# Patient Record
Sex: Female | Born: 1955 | Race: White | Hispanic: No
Health system: Southern US, Academic
[De-identification: ages and names within clinical notes are randomized; demographics above are authoritative.]

## PROBLEM LIST (undated history)

## (undated) ENCOUNTER — Encounter: Attending: Internal Medicine | Primary: Internal Medicine

## (undated) ENCOUNTER — Ambulatory Visit: Payer: Medicare (Managed Care)

## (undated) ENCOUNTER — Ambulatory Visit: Payer: Medicare (Managed Care) | Attending: Internal Medicine | Primary: Internal Medicine

## (undated) ENCOUNTER — Ambulatory Visit

## (undated) ENCOUNTER — Ambulatory Visit: Payer: MEDICARE

## (undated) ENCOUNTER — Encounter

## (undated) ENCOUNTER — Ambulatory Visit: Payer: MEDICARE | Attending: Internal Medicine | Primary: Internal Medicine

## (undated) ENCOUNTER — Inpatient Hospital Stay: Payer: Medicare (Managed Care)

## (undated) ENCOUNTER — Ambulatory Visit: Payer: MEDICARE | Attending: Family | Primary: Family

## (undated) ENCOUNTER — Telehealth

## (undated) ENCOUNTER — Ambulatory Visit: Attending: Orthopaedic Surgery | Primary: Orthopaedic Surgery

## (undated) ENCOUNTER — Ambulatory Visit: Payer: MEDICARE | Attending: Pain Medicine | Primary: Pain Medicine

## (undated) ENCOUNTER — Ambulatory Visit: Attending: Internal Medicine | Primary: Internal Medicine

## (undated) ENCOUNTER — Ambulatory Visit: Attending: Physician Assistant | Primary: Physician Assistant

## (undated) ENCOUNTER — Ambulatory Visit: Payer: Medicare (Managed Care) | Attending: Pharmacotherapy | Primary: Pharmacotherapy

## (undated) ENCOUNTER — Encounter
Attending: Student in an Organized Health Care Education/Training Program | Primary: Student in an Organized Health Care Education/Training Program

## (undated) ENCOUNTER — Ambulatory Visit
Payer: MEDICARE | Attending: Student in an Organized Health Care Education/Training Program | Primary: Student in an Organized Health Care Education/Training Program

## (undated) DIAGNOSIS — K589 Irritable bowel syndrome without diarrhea: Secondary | ICD-10-CM

## (undated) DIAGNOSIS — I10 Essential (primary) hypertension: Secondary | ICD-10-CM

## (undated) DIAGNOSIS — F32A Depression, unspecified: Secondary | ICD-10-CM

## (undated) DIAGNOSIS — E785 Hyperlipidemia, unspecified: Secondary | ICD-10-CM

## (undated) DIAGNOSIS — M797 Fibromyalgia: Secondary | ICD-10-CM

## (undated) DIAGNOSIS — K219 Gastro-esophageal reflux disease without esophagitis: Secondary | ICD-10-CM

## (undated) DIAGNOSIS — T7840XA Allergy, unspecified, initial encounter: Secondary | ICD-10-CM

## (undated) DIAGNOSIS — M1990 Unspecified osteoarthritis, unspecified site: Secondary | ICD-10-CM

## (undated) DIAGNOSIS — I2699 Other pulmonary embolism without acute cor pulmonale: Secondary | ICD-10-CM

## (undated) DIAGNOSIS — G4730 Sleep apnea, unspecified: Secondary | ICD-10-CM

## (undated) DIAGNOSIS — F419 Anxiety disorder, unspecified: Secondary | ICD-10-CM

## (undated) DIAGNOSIS — J849 Interstitial pulmonary disease, unspecified: Secondary | ICD-10-CM

## (undated) DIAGNOSIS — F329 Major depressive disorder, single episode, unspecified: Secondary | ICD-10-CM

## (undated) HISTORY — PX: COLONOSCOPY: SHX174

## (undated) HISTORY — DX: Anxiety disorder, unspecified: F41.9

## (undated) HISTORY — DX: Fibromyalgia: M79.7

## (undated) HISTORY — PX: CHOLECYSTECTOMY: SHX55

## (undated) HISTORY — DX: Gastro-esophageal reflux disease without esophagitis: K21.9

## (undated) HISTORY — DX: Essential (primary) hypertension: I10

## (undated) HISTORY — DX: Hyperlipidemia, unspecified: E78.5

## (undated) HISTORY — PX: POLYPECTOMY: SHX149

## (undated) HISTORY — DX: Major depressive disorder, single episode, unspecified: F32.9

## (undated) HISTORY — DX: Allergy, unspecified, initial encounter: T78.40XA

## (undated) HISTORY — DX: Sleep apnea, unspecified: G47.30

## (undated) HISTORY — DX: Interstitial pulmonary disease, unspecified: J84.9

## (undated) HISTORY — DX: Unspecified osteoarthritis, unspecified site: M19.90

## (undated) HISTORY — DX: Depression, unspecified: F32.A

## (undated) HISTORY — DX: Irritable bowel syndrome, unspecified: K58.9

## (undated) MED ORDER — VORTIOXETINE 20 MG TABLET: Freq: Every day | ORAL | 0 days

---
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
## 1997-09-16 HISTORY — PX: PARTIAL HYSTERECTOMY: SHX80

## 2008-10-28 LAB — HM DEXA SCAN

## 2010-10-28 LAB — HM COLONOSCOPY

## 2013-03-16 LAB — HM MAMMOGRAPHY

## 2013-10-28 ENCOUNTER — Telehealth: Payer: Self-pay

## 2013-10-28 ENCOUNTER — Ambulatory Visit (INDEPENDENT_AMBULATORY_CARE_PROVIDER_SITE_OTHER): Payer: 59 | Admitting: Family Medicine

## 2013-10-28 ENCOUNTER — Encounter (INDEPENDENT_AMBULATORY_CARE_PROVIDER_SITE_OTHER): Payer: Self-pay

## 2013-10-28 ENCOUNTER — Encounter: Payer: Self-pay | Admitting: Family Medicine

## 2013-10-28 VITALS — BP 120/80 | HR 93 | Temp 98.4°F | Resp 16 | Wt 231.4 lb

## 2013-10-28 DIAGNOSIS — Z79890 Hormone replacement therapy (postmenopausal): Secondary | ICD-10-CM | POA: Insufficient documentation

## 2013-10-28 DIAGNOSIS — E88819 Insulin resistance, unspecified: Secondary | ICD-10-CM | POA: Insufficient documentation

## 2013-10-28 DIAGNOSIS — I10 Essential (primary) hypertension: Secondary | ICD-10-CM

## 2013-10-28 DIAGNOSIS — K219 Gastro-esophageal reflux disease without esophagitis: Secondary | ICD-10-CM | POA: Insufficient documentation

## 2013-10-28 DIAGNOSIS — F341 Dysthymic disorder: Secondary | ICD-10-CM

## 2013-10-28 DIAGNOSIS — F418 Other specified anxiety disorders: Secondary | ICD-10-CM | POA: Insufficient documentation

## 2013-10-28 DIAGNOSIS — E8881 Metabolic syndrome: Secondary | ICD-10-CM

## 2013-10-28 DIAGNOSIS — G4733 Obstructive sleep apnea (adult) (pediatric): Secondary | ICD-10-CM | POA: Insufficient documentation

## 2013-10-28 NOTE — Assessment & Plan Note (Signed)
New to provider, ongoing for pt.  Was following w/ GYN previously and plans to establish.  Previous GYN wanted her to wean off meds.  Will follow.

## 2013-10-28 NOTE — Assessment & Plan Note (Signed)
New to provider, ongoing for pt.  Unsure of settings.  Pt feels sxs are well controlled.  Will follow.

## 2013-10-28 NOTE — Assessment & Plan Note (Signed)
Chronic problem.  Feels sxs are well controlled.  Will follow.

## 2013-10-28 NOTE — Assessment & Plan Note (Signed)
New to provider, ongoing for pt.  Currently well controlled.  Asymptomatic.  Will follow.

## 2013-10-28 NOTE — Telephone Encounter (Signed)
Medication and allergies:  Reviewed and updated  90 day supply/mail order: n/a Local pharmacy:  [facility] on [address]   Immunizations due:  UTD   A/P: Personal, family history and past surgical hx: Updated  PAP- hx. hysterectomy CCS- "3 years ago"- benign polyps- per patient  MMG- 03/2013 per patient  Flu- 06/2013 per patient Tdap- Less than 10 years ago per patient   To Discuss with Provider: Concerned about blood pressure and blood sugar

## 2013-10-28 NOTE — Assessment & Plan Note (Signed)
New to provider, ongoing for pt.  Check A1C to determine current levels and start meds prn.  Strongly encouraged pt to follow low carb diet and get regular exercise.  Will follow closely.

## 2013-10-28 NOTE — Progress Notes (Signed)
   Subjective:    Patient ID: Stacey Alvarez, female    DOB: 11/01/1955, 58 y.o.   MRN: [id number]  HPI New to establish.  Previous MD- Rhys Martini in [place].  GYN- [facility]  HTN- chronic problem, on Norvasc, Lisinopril.  Well controlled.  No CP, SOB, visual changes.  + HAs due to sinus pressure.  No edema.  OSA- on CPAP.  Reports sxs are well controlled.  Not aware of settings  Insulin resistance- last A1C was 6.4 3-4 months ago.  + family hx of DM (mom and dad).  Not exercising regularly- 'i don't know what's wrong w/ me'.  GERD- chronic problem, well controlled on Dexilant.  Hx of IBS.    Depression/anxiety- was previously seeing psych in [place].  Has appt upcoming w/ Dr Toy Care, she will manage the Xanax, Lamictal, Temazepam, Effexor.  Father recently passed away and has son w/ CF (age 43)  HRT- currently on Premarin, plans to schedule w/ GYN later this year.   Review of Systems For ROS see HPI     Objective:   Physical Exam  Constitutional: She is oriented to person, place, and time. She appears well-developed and well-nourished. No distress.  HENT:  Head: Normocephalic and atraumatic.  Eyes: Conjunctivae and EOM are normal. Pupils are equal, round, and reactive to light.  Neck: Normal range of motion. Neck supple. No thyromegaly present.  Cardiovascular: Normal rate, regular rhythm, normal heart sounds and intact distal pulses.   No murmur heard. Pulmonary/Chest: Effort normal and breath sounds normal. No respiratory distress.  Abdominal: Soft. She exhibits no distension. There is no tenderness.  Musculoskeletal: She exhibits no edema.  Lymphadenopathy:    She has no cervical adenopathy.  Neurological: She is alert and oriented to person, place, and time.  Skin: Skin is warm and dry.  Psychiatric: She has a normal mood and affect. Her behavior is normal.          Assessment & Plan:

## 2013-10-28 NOTE — Progress Notes (Signed)
Pre visit review using our clinic review tool, if applicable. No additional management support is needed unless otherwise documented below in the visit note. 

## 2013-10-28 NOTE — Patient Instructions (Signed)
Follow up in 3 months to recheck A1C levels- please make this a morning appt and fasting We'll notify you of your lab results and make any changes Try and get regular exercise and make healthy food choices Call with any questions or concerns Welcome!!  We're glad to have you!!

## 2013-10-28 NOTE — Assessment & Plan Note (Signed)
New to provider, ongoing for pt.  Has upcoming appt w/ psych.  Feels sxs are typically well managed but due to father's recent death and end of son's 8 yr relationship, she admits that she's having a hard time.  Will follow and assist as able.

## 2013-10-29 ENCOUNTER — Telehealth: Payer: Self-pay | Admitting: Family Medicine

## 2013-10-29 LAB — BASIC METABOLIC PANEL
BUN: 18 mg/dL (ref 6–23)
CO2: 28 mEq/L (ref 19–32)
CREATININE: 0.5 mg/dL (ref 0.4–1.2)
Calcium: 9.2 mg/dL (ref 8.4–10.5)
Chloride: 103 mEq/L (ref 96–112)
GFR: 122.41 mL/min (ref >60.00)
Glucose, Bld: 94 mg/dL (ref 70–99)
POTASSIUM: 4.2 meq/L (ref 3.5–5.1)
Sodium: 138 mEq/L (ref 135–145)

## 2013-10-29 LAB — HEMOGLOBIN A1C: HEMOGLOBIN A1C: 5.2 % (ref 4.6–6.5)

## 2013-10-29 NOTE — Telephone Encounter (Signed)
Relevant patient education assigned to patient using Emmi. ° °

## 2013-11-10 ENCOUNTER — Ambulatory Visit: Payer: 59 | Admitting: Family Medicine

## 2013-11-23 ENCOUNTER — Encounter: Payer: Self-pay | Admitting: Family Medicine

## 2013-11-23 ENCOUNTER — Ambulatory Visit (INDEPENDENT_AMBULATORY_CARE_PROVIDER_SITE_OTHER): Payer: 59 | Admitting: Family Medicine

## 2013-11-23 VITALS — BP 126/80 | HR 95 | Temp 97.9°F | Wt 230.4 lb

## 2013-11-23 DIAGNOSIS — J019 Acute sinusitis, unspecified: Secondary | ICD-10-CM

## 2013-11-23 DIAGNOSIS — R05 Cough: Secondary | ICD-10-CM

## 2013-11-23 DIAGNOSIS — R059 Cough, unspecified: Secondary | ICD-10-CM

## 2013-11-23 MED ORDER — AMOXICILLIN-POT CLAVULANATE 875-125 MG PO TABS: 1.0000 | ORAL_TABLET | Freq: Two times a day (BID) | ORAL | Status: DC

## 2013-11-23 MED ORDER — HYDROCOD POLST-CHLORPHEN POLST 10-8 MG/5ML PO LQCR: 5.0000 mL | Freq: Every evening | ORAL | Status: DC | PRN

## 2013-11-23 NOTE — Progress Notes (Signed)
  Subjective:     Stacey Alvarez is a 58 y.o. female who presents for evaluation of sinus pain. Symptoms include: congestion, cough, headaches, nasal congestion and sinus pressure. Onset of symptoms was 2 weeks ago. Symptoms have been gradually worsening since that time. Past history is significant for no history of pneumonia or bronchitis. Patient is a non-smoker.  The following portions of the patient's history were reviewed and updated as appropriate: allergies, current medications, past family history, past medical history, past social history, past surgical history and problem list.  Review of Systems Pertinent items are noted in HPI.   Objective:    BP 126/80  Pulse 95  Temp(Src) 97.9 F (36.6 C) (Oral)  Wt 230 lb 6.4 oz (104.509 kg)  SpO2 95% General appearance: alert, cooperative, appears stated age and no distress Ears: normal TM's and external ear canals both ears Nose: green discharge, moderate congestion, turbinates red, swollen, sinus tenderness bilateral Throat: lips, mucosa, and tongue normal; teeth and gums normal Neck: marked anterior cervical adenopathy, supple, symmetrical, trachea midline and thyroid not enlarged, symmetric, no tenderness/mass/nodules Lungs: clear to auscultation bilaterally Heart: S1, S2 normal Lymph nodes: Cervical adenopathy: b/l    Assessment:    Acute bacterial sinusitis.    Plan:    Nasal steroids per medication orders. Antihistamines per medication orders. Augmentin per medication orders.  tussionex rto prn

## 2013-11-23 NOTE — Patient Instructions (Signed)

## 2013-11-23 NOTE — Progress Notes (Signed)
Pre visit review using our clinic review tool, if applicable. No additional management support is needed unless otherwise documented below in the visit note. 

## 2013-12-31 ENCOUNTER — Encounter: Payer: Self-pay | Admitting: Internal Medicine

## 2013-12-31 ENCOUNTER — Ambulatory Visit (INDEPENDENT_AMBULATORY_CARE_PROVIDER_SITE_OTHER): Payer: 59 | Admitting: Internal Medicine

## 2013-12-31 VITALS — BP 144/80 | HR 92 | Temp 98.3°F | Wt 234.0 lb

## 2013-12-31 DIAGNOSIS — R350 Frequency of micturition: Secondary | ICD-10-CM

## 2013-12-31 DIAGNOSIS — N390 Urinary tract infection, site not specified: Secondary | ICD-10-CM

## 2013-12-31 LAB — POCT URINALYSIS DIPSTICK
Glucose, UA: NEGATIVE
Ketones, UA: NEGATIVE
LEUKOCYTES UA: NEGATIVE
NITRITE UA: POSITIVE
PH UA: 5
PROTEIN UA: 30
Spec Grav, UA: >=1.03
Urobilinogen, UA: 0.2

## 2013-12-31 MED ORDER — CIPROFLOXACIN HCL 500 MG PO TABS: 500.0000 mg | ORAL_TABLET | Freq: Two times a day (BID) | ORAL | Status: DC

## 2013-12-31 MED ORDER — FLUCONAZOLE 150 MG PO TABS: 150.0000 mg | ORAL_TABLET | Freq: Once | ORAL | Status: DC

## 2013-12-31 NOTE — Progress Notes (Signed)
Subjective:    Patient ID: Stacey Alvarez, female    DOB: Sep 01, 1956, 58 y.o.   MRN: [id number]  DOS:  12/31/2013 Type of  visit: Acute visit Symptoms started 3 days ago with urinary frequency, feels likes she needs to urinate all the time. Some dysuria. No  UTIs in a while  ROS Denies fever chills Some nausea no vomiting. Mild suprapubic pain, nor generalized abdominal pain. No flank pain. Other than that she feels very good. Has a tendency to develop yeast infections with antibiotics   Past Medical History  Diagnosis Date  . Hypertension   . Anxiety   . Sleep apnea   . Fibromyalgia   . IBS (irritable bowel syndrome)   . Depression     Past Surgical History  Procedure Laterality Date  . Partial hysterectomy  1999  . Cholecystectomy      History   Social History  . Marital Status: Married    Spouse Name: N/A    Number of Children: N/A  . Years of Education: N/A   Occupational History  . Not on file.   Social History Main Topics  . Smoking status: Never Smoker   . Smokeless tobacco: Never Used  . Alcohol Use: Yes  . Drug Use: No  . Sexual Activity: Yes   Other Topics Concern  . Not on file   Social History Narrative  . No narrative on file        Medication List       This list is accurate as of: 12/31/13 11:59 PM.  Always use your most recent med list.               ALPRAZolam 1 MG tablet  Commonly known as:  XANAX  Take 1 tablet by mouth 2 (two) times daily. 0.5 mg (1/2 tab) in the morning and 2 mg (2 tabs) at bedtime.     amLODipine 5 MG tablet  Commonly known as:  NORVASC  Take 5 mg by mouth at bedtime.     butalbital-aspirin-caffeine-codeine 50-325-40-30 MG capsule  Commonly known as:  FIORINAL WITH CODEINE  Take 1 capsule by mouth as needed.     ciprofloxacin 500 MG tablet  Commonly known as:  CIPRO  Take 1 tablet (500 mg total) by mouth 2 (two) times daily.     DEXILANT 60 MG capsule  Generic drug:  dexlansoprazole  Take 1  capsule by mouth daily.     fluconazole 150 MG tablet  Commonly known as:  DIFLUCAN  Take 1 tablet (150 mg total) by mouth once.     lamoTRIgine 100 MG tablet  Commonly known as:  LAMICTAL  Take 1 tablet by mouth daily.     lisinopril 20 MG tablet  Commonly known as:  PRINIVIL,ZESTRIL  Take 1 tablet by mouth 2 (two) times daily.     PREMARIN 0.625 MG tablet  Generic drug:  estrogens (conjugated)  Take 1 tablet by mouth daily.     promethazine 12.5 MG tablet  Commonly known as:  PHENERGAN     temazepam 30 MG capsule  Commonly known as:  RESTORIL  Take 1 capsule by mouth at bedtime.     traMADol 50 MG tablet  Commonly known as:  ULTRAM  Take 1 tablet by mouth as needed.     venlafaxine XR 75 MG 24 hr capsule  Commonly known as:  EFFEXOR-XR  Take 1 capsule by mouth daily.  Objective:   Physical Exam BP 144/80  Pulse 92  Temp(Src) 98.3 F (36.8 C)  Wt 234 lb (106.142 kg)  SpO2 96% General -- alert, well-developed, NAD.   Abdomen-- Not distended, good bowel sounds,soft, non-tender. No CVA tenderness Neurologic--  alert & oriented X3. Speech normal, gait normal, strength normal in all extremities. Psych-- Cognition and judgment appear intact. Cooperative with normal attention span and concentration. No anxious or depressed appearing.        Assessment & Plan:   UTI, Symptoms consistent with a UTI, U dip supportive of the dx Plan: Urine culture, ciprofloxacin for 3 days. Diflucan  prescription sent in case she developeds a yeast infection.

## 2013-12-31 NOTE — Progress Notes (Signed)
Pre visit review using our clinic review tool, if applicable. No additional management support is needed unless otherwise documented below in the visit note. 

## 2013-12-31 NOTE — Patient Instructions (Addendum)
Ciprofloxacin for 3 days Drink plenty of fluids If not improving in the next few days let us know. If you develop a yeast infection, use Diflucan.

## 2014-01-27 ENCOUNTER — Other Ambulatory Visit (INDEPENDENT_AMBULATORY_CARE_PROVIDER_SITE_OTHER): Payer: 59

## 2014-01-27 ENCOUNTER — Other Ambulatory Visit: Payer: 59

## 2014-01-27 DIAGNOSIS — E8881 Metabolic syndrome: Secondary | ICD-10-CM

## 2014-01-27 DIAGNOSIS — I10 Essential (primary) hypertension: Secondary | ICD-10-CM

## 2014-01-27 LAB — HEMOGLOBIN A1C: HEMOGLOBIN A1C: 5.2 % (ref 4.6–6.5)

## 2014-03-16 ENCOUNTER — Other Ambulatory Visit: Payer: Self-pay

## 2014-03-24 ENCOUNTER — Ambulatory Visit (INDEPENDENT_AMBULATORY_CARE_PROVIDER_SITE_OTHER): Payer: 59 | Admitting: Physician Assistant

## 2014-03-24 ENCOUNTER — Other Ambulatory Visit (HOSPITAL_COMMUNITY): Payer: Self-pay | Admitting: Physician Assistant

## 2014-03-24 ENCOUNTER — Encounter (HOSPITAL_COMMUNITY): Payer: Self-pay | Admitting: Physician Assistant

## 2014-03-24 VITALS — BP 139/80 | HR 101 | Ht 64.0 in | Wt 241.0 lb

## 2014-03-24 DIAGNOSIS — F332 Major depressive disorder, recurrent severe without psychotic features: Secondary | ICD-10-CM

## 2014-03-24 DIAGNOSIS — F411 Generalized anxiety disorder: Secondary | ICD-10-CM

## 2014-03-24 MED ORDER — CLONAZEPAM 1 MG PO TABS: ORAL_TABLET | ORAL | Status: DC

## 2014-03-24 MED ORDER — VENLAFAXINE HCL ER 150 MG PO CP24: ORAL_CAPSULE | ORAL | Status: DC

## 2014-03-24 MED ORDER — TEMAZEPAM 15 MG PO CAPS: 15.0000 mg | ORAL_CAPSULE | Freq: Every day | ORAL | Status: DC

## 2014-03-24 MED ORDER — LAMOTRIGINE 100 MG PO TABS: ORAL_TABLET | ORAL | Status: DC
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
## 2014-03-24 NOTE — Patient Instructions (Signed)
1. Stop taking the Ativan. 2. Your Effexor dose has been changed to the morning time. 3. Your Lamictal dose has been changed to bedtime. 4. Your Restoril dose has been decreased by 1/2. 5. You must see an out patient therapist before you return to me. 6. I strongly recommend a good Al-Anon chapter for you and your husband.

## 2014-04-12 ENCOUNTER — Encounter (HOSPITAL_COMMUNITY): Payer: Self-pay | Admitting: Professional Counselor

## 2014-04-12 ENCOUNTER — Ambulatory Visit (INDEPENDENT_AMBULATORY_CARE_PROVIDER_SITE_OTHER): Payer: 59 | Admitting: Professional Counselor

## 2014-04-12 DIAGNOSIS — F332 Major depressive disorder, recurrent severe without psychotic features: Secondary | ICD-10-CM
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
## 2014-04-14 ENCOUNTER — Encounter (HOSPITAL_COMMUNITY): Payer: Self-pay | Admitting: Emergency Medicine

## 2014-04-14 ENCOUNTER — Emergency Department (HOSPITAL_COMMUNITY)
Admission: EM | Admit: 2014-04-14 | Discharge: 2014-04-14 | Disposition: A | Discharge status: Home / Self Care | Payer: 59 | Attending: Emergency Medicine | Admitting: Emergency Medicine

## 2014-04-14 ENCOUNTER — Other Ambulatory Visit: Payer: Self-pay

## 2014-04-14 ENCOUNTER — Telehealth (HOSPITAL_COMMUNITY): Payer: Self-pay

## 2014-04-14 DIAGNOSIS — I10 Essential (primary) hypertension: Secondary | ICD-10-CM | POA: Diagnosis not present

## 2014-04-14 DIAGNOSIS — R112 Nausea with vomiting, unspecified: Secondary | ICD-10-CM

## 2014-04-14 DIAGNOSIS — Z792 Long term (current) use of antibiotics: Secondary | ICD-10-CM | POA: Insufficient documentation

## 2014-04-14 DIAGNOSIS — F329 Major depressive disorder, single episode, unspecified: Secondary | ICD-10-CM | POA: Diagnosis not present

## 2014-04-14 DIAGNOSIS — F411 Generalized anxiety disorder: Secondary | ICD-10-CM | POA: Diagnosis not present

## 2014-04-14 DIAGNOSIS — IMO0001 Reserved for inherently not codable concepts without codable children: Secondary | ICD-10-CM | POA: Diagnosis not present

## 2014-04-14 DIAGNOSIS — F3289 Other specified depressive episodes: Secondary | ICD-10-CM | POA: Insufficient documentation

## 2014-04-14 DIAGNOSIS — K589 Irritable bowel syndrome without diarrhea: Secondary | ICD-10-CM | POA: Diagnosis not present

## 2014-04-14 DIAGNOSIS — R197 Diarrhea, unspecified: Secondary | ICD-10-CM | POA: Insufficient documentation

## 2014-04-14 DIAGNOSIS — Z79899 Other long term (current) drug therapy: Secondary | ICD-10-CM | POA: Diagnosis not present

## 2014-04-14 LAB — CBC WITH DIFFERENTIAL/PLATELET
Basophils Absolute: 0 10*3/uL (ref 0.0–0.1)
Basophils Relative: 0 % (ref 0–1)
EOS ABS: 0.1 10*3/uL (ref 0.0–0.7)
Eosinophils Relative: 1 % (ref 0–5)
HEMATOCRIT: 40.2 % (ref 36.0–46.0)
HEMOGLOBIN: 13.5 g/dL (ref 12.0–15.0)
Lymphocytes Relative: 8 % — ABNORMAL LOW (ref 12–46)
Lymphs Abs: 0.7 10*3/uL (ref 0.7–4.0)
MCH: 27.4 pg (ref 26.0–34.0)
MCHC: 33.6 g/dL (ref 30.0–36.0)
MCV: 81.7 fL (ref 78.0–100.0)
MONO ABS: 0.5 10*3/uL (ref 0.1–1.0)
MONOS PCT: 5 % (ref 3–12)
NEUTROS PCT: 86 % — AB (ref 43–77)
Neutro Abs: 8.3 10*3/uL — ABNORMAL HIGH (ref 1.7–7.7)
Platelets: 214 10*3/uL (ref 150–400)
RBC: 4.92 MIL/uL (ref 3.87–5.11)
RDW: 13.5 % (ref 11.5–15.5)
WBC: 9.6 10*3/uL (ref 4.0–10.5)

## 2014-04-14 LAB — URINALYSIS, ROUTINE W REFLEX MICROSCOPIC
BILIRUBIN URINE: NEGATIVE
Glucose, UA: NEGATIVE mg/dL
Hgb urine dipstick: NEGATIVE
KETONES UR: NEGATIVE mg/dL
LEUKOCYTES UA: NEGATIVE
NITRITE: NEGATIVE
PROTEIN: NEGATIVE mg/dL
Specific Gravity, Urine: 1.025 (ref 1.005–1.030)
Urobilinogen, UA: 0.2 mg/dL (ref 0.0–1.0)
pH: 5.5 (ref 5.0–8.0)

## 2014-04-14 LAB — COMPREHENSIVE METABOLIC PANEL
ALBUMIN: 3.5 g/dL (ref 3.5–5.2)
ALT: 7 U/L (ref 0–35)
ANION GAP: 16 — AB (ref 5–15)
AST: 9 U/L (ref 0–37)
Alkaline Phosphatase: 81 U/L (ref 39–117)
BUN: 19 mg/dL (ref 6–23)
CO2: 24 mEq/L (ref 19–32)
CREATININE: 0.66 mg/dL (ref 0.50–1.10)
Calcium: 8.9 mg/dL (ref 8.4–10.5)
Chloride: 100 mEq/L (ref 96–112)
GFR calc non Af Amer: >90 mL/min (ref >90)
Glucose, Bld: 156 mg/dL — ABNORMAL HIGH (ref 70–99)
Potassium: 4.2 mEq/L (ref 3.7–5.3)
Sodium: 140 mEq/L (ref 137–147)
TOTAL PROTEIN: 6.8 g/dL (ref 6.0–8.3)
Total Bilirubin: 0.3 mg/dL (ref 0.3–1.2)

## 2014-04-14 LAB — LIPASE, BLOOD: Lipase: 15 U/L (ref 11–59)

## 2014-04-14 LAB — I-STAT TROPONIN, ED: Troponin i, poc: 0 ng/mL (ref 0.00–0.08)

## 2014-04-14 MED ORDER — SODIUM CHLORIDE 0.9 % IV SOLN: 1000.0000 mL | INTRAVENOUS | Status: DC

## 2014-04-14 MED ORDER — SODIUM CHLORIDE 0.9 % IV SOLN: 1000.0000 mL | Freq: Once | INTRAVENOUS | Status: DC

## 2014-04-14 MED ORDER — ONDANSETRON HCL 4 MG/2ML IJ SOLN
4.0000 mg | Freq: Once | INTRAMUSCULAR | Status: AC
  Administered 2014-04-14: 4 mg via INTRAVENOUS
  Filled 2014-04-14: qty 2

## 2014-04-14 MED ORDER — LOPERAMIDE HCL 2 MG PO CAPS
4.0000 mg | ORAL_CAPSULE | Freq: Once | ORAL | Status: AC
  Administered 2014-04-14: 4 mg via ORAL
  Filled 2014-04-14: qty 2

## 2014-04-14 MED ORDER — SODIUM CHLORIDE 0.9 % IV SOLN
1000.0000 mL | Freq: Once | INTRAVENOUS | Status: AC
  Administered 2014-04-14: 1000 mL via INTRAVENOUS

## 2014-04-14 MED ORDER — PROMETHAZINE HCL 25 MG PO TABS: 25.0000 mg | ORAL_TABLET | Freq: Four times a day (QID) | ORAL | Status: DC | PRN
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
## 2014-04-14 NOTE — ED Provider Notes (Signed)
CSN: [id number]     Arrival date & time 04/14/14  0141 History   None    Chief Complaint  Patient presents with  . Nausea  . Emesis  . Diarrhea     (Consider location/radiation/quality/duration/timing/severity/associated sxs/prior Treatment) Patient is a 58 y.o. female presenting with vomiting and diarrhea. The history is provided by the patient.  Emesis Associated symptoms: diarrhea   Diarrhea Associated symptoms: vomiting   She had sudden onset about midnight of nausea, vomiting, diarrhea. She's had multiple episodes of each. She also developed pain in the left interscapular area with some radiation to the left arm. Pain is moderate and she rates it at 6/10. Nothing makes it better nothing makes it worse. She denies fever, chills, sweats. There's been some mild abdominal cramping. She denies any sick contacts.  Past Medical History  Diagnosis Date  . Hypertension   . Anxiety   . Sleep apnea   . Fibromyalgia   . IBS (irritable bowel syndrome)   . Depression    Past Surgical History  Procedure Laterality Date  . Partial hysterectomy  1999  . Cholecystectomy     Family History  Problem Relation Age of Onset  . Diabetes Mother   . Diabetes Father   . Diabetes Paternal Grandmother   . Heart disease Mother   . Heart disease Father   . Anxiety disorder Mother   . Anxiety disorder Father   . Depression Mother   . Depression Father    History  Substance Use Topics  . Smoking status: Never Smoker   . Smokeless tobacco: Never Used  . Alcohol Use: Yes   OB History   Grav Para Term Preterm Abortions TAB SAB Ect Mult Living                 Review of Systems  Gastrointestinal: Positive for vomiting and diarrhea.  All other systems reviewed and are negative.     Allergies  Sulfa antibiotics  Home Medications   Prior to Admission medications   Medication Sig Start Date End Date Taking? Authorizing Provider  amLODipine (NORVASC) 5 MG tablet Take 5 mg by mouth at  bedtime. 09/04/13   Historical Provider, MD  clonazePAM (KLONOPIN) 1 MG tablet Take 1/2 tablet in AM. And 1 tablet in the evening. 03/24/14   Nena Polio, PA-C  DEXILANT 60 MG capsule Take 1 capsule by mouth daily. 09/04/13   Historical Provider, MD  fluconazole (DIFLUCAN) 150 MG tablet Take 1 tablet (150 mg total) by mouth once. 12/31/13   Colon Branch, MD  lamoTRIgine (LAMICTAL) 100 MG tablet Take one and one half, (150mg ) each bedtime. 03/24/14   Nena Polio, PA-C  lisinopril (PRINIVIL,ZESTRIL) 20 MG tablet Take 1 tablet by mouth 2 (two) times daily. 10/25/13   Historical Provider, MD  PREMARIN 0.625 MG tablet Take 1 tablet by mouth daily. 10/25/13   Historical Provider, MD  temazepam (RESTORIL) 15 MG capsule Take 1 capsule (15 mg total) by mouth at bedtime. 03/24/14   Nena Polio, PA-C  venlafaxine XR (EFFEXOR-XR) 150 MG 24 hr capsule Take one capsule each morning for depression. 03/24/14   Nena Polio, PA-C   BP 115/54  Pulse 98  Temp(Src) 98.2 F (36.8 C) (Oral)  Resp 28  SpO2 99% Physical Exam  Nursing note and vitals reviewed.  58 year old female, resting comfortably and in no acute distress. Vital signs are significant for tachypnea with respiratory rate of 28. Oxygen saturation is 99%, which is normal. Head  is normocephalic and atraumatic. PERRLA, EOMI. Oropharynx is clear. Neck is nontender and supple without adenopathy or JVD. Back is mildly tender in the left interscapular area. There is no CVA tenderness. Lungs are clear without rales, wheezes, or rhonchi. Chest is nontender. Heart has regular rate and rhythm without murmur. Abdomen is soft, flat, nontender without masses or hepatosplenomegaly and peristalsis is high-pitched and comes in rashes. Extremities have no cyanosis or edema, full range of motion is present. Skin is warm and dry without rash. Neurologic: Mental status is normal, cranial nerves are intact, there are no motor or sensory deficits.  ED Course  Procedures  (including critical care time) Labs Review Results for orders placed during the hospital encounter of 04/14/14  CBC WITH DIFFERENTIAL      Result Value Ref Range   WBC 9.6  4.0 - 10.5 K/uL   RBC 4.92  3.87 - 5.11 MIL/uL   Hemoglobin 13.5  12.0 - 15.0 g/dL   HCT 40.2  36.0 - 46.0 %   MCV 81.7  78.0 - 100.0 fL   MCH 27.4  26.0 - 34.0 pg   MCHC 33.6  30.0 - 36.0 g/dL   RDW 13.5  11.5 - 15.5 %   Platelets 214  150 - 400 K/uL   Neutrophils Relative % 86 (*) 43 - 77 %   Neutro Abs 8.3 (*) 1.7 - 7.7 K/uL   Lymphocytes Relative 8 (*) 12 - 46 %   Lymphs Abs 0.7  0.7 - 4.0 K/uL   Monocytes Relative 5  3 - 12 %   Monocytes Absolute 0.5  0.1 - 1.0 K/uL   Eosinophils Relative 1  0 - 5 %   Eosinophils Absolute 0.1  0.0 - 0.7 K/uL   Basophils Relative 0  0 - 1 %   Basophils Absolute 0.0  0.0 - 0.1 K/uL  COMPREHENSIVE METABOLIC PANEL      Result Value Ref Range   Sodium 140  137 - 147 mEq/L   Potassium 4.2  3.7 - 5.3 mEq/L   Chloride 100  96 - 112 mEq/L   CO2 24  19 - 32 mEq/L   Glucose, Bld 156 (*) 70 - 99 mg/dL   BUN 19  6 - 23 mg/dL   Creatinine, Ser 0.66  0.50 - 1.10 mg/dL   Calcium 8.9  8.4 - 10.5 mg/dL   Total Protein 6.8  6.0 - 8.3 g/dL   Albumin 3.5  3.5 - 5.2 g/dL   AST 9  0 - 37 U/L   ALT 7  0 - 35 U/L   Alkaline Phosphatase 81  39 - 117 U/L   Total Bilirubin 0.3  0.3 - 1.2 mg/dL   GFR calc non Af Amer >90  >90 mL/min   GFR calc Af Amer >90  >90 mL/min   Anion gap 16 (*) 5 - 15  LIPASE, BLOOD      Result Value Ref Range   Lipase 15  11 - 59 U/L  URINALYSIS, ROUTINE W REFLEX MICROSCOPIC      Result Value Ref Range   Color, Urine YELLOW  YELLOW   APPearance CLEAR  CLEAR   Specific Gravity, Urine 1.025  1.005 - 1.030   pH 5.5  5.0 - 8.0   Glucose, UA NEGATIVE  NEGATIVE mg/dL   Hgb urine dipstick NEGATIVE  NEGATIVE   Bilirubin Urine NEGATIVE  NEGATIVE   Ketones, ur NEGATIVE  NEGATIVE mg/dL   Protein, ur NEGATIVE  NEGATIVE mg/dL  Urobilinogen, UA 0.2  0.0 - 1.0 mg/dL    Nitrite NEGATIVE  NEGATIVE   Leukocytes, UA NEGATIVE  NEGATIVE  I-STAT TROPOININ, ED      Result Value Ref Range   Troponin i, poc 0.00  0.00 - 0.08 ng/mL   Comment 3             Date: 04/14/2014  Rate: 96  Rhythm: normal sinus rhythm  QRS Axis: normal  Intervals: normal  ST/T Wave abnormalities: normal  Conduction Disutrbances:none  Narrative Interpretation: Left atrial hypertrophy. No pericecal available for comparison.  Old EKG Reviewed: none available   MDM   Final diagnoses:  Nausea vomiting and diarrhea    Vomiting and diarrhea which may be a viral gastroenteritis. Screening labs have been obtained and she is given ondansetron for nausea and loperamide for diarrhea.  She feels much better after above noted treatment. She is discharged with prescription for promethazine and advised to use over-the-counter loperamide as needed for diarrhea.   Delora Fuel, MD 30/16/01 0932

## 2014-04-14 NOTE — ED Notes (Signed)
Patient aware urine sample is needed.  

## 2014-04-14 NOTE — ED Notes (Signed)
Pt reports to the ED via [facility] for eval of sudden onset of nausea and vomiting that began at approx 12:15. Pt reports she then developed a sharp pain in her left shoulder that radiates down to her left elbow and diarrhea. Pt reports approx 6 episodes of emesis and 3 episodes of diarrhea. Pt reports the last thing she ate was stewed apples and macaroni and cheese. Pt reports some tenderness and soreness in her lower sternum. 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 Pt denies any sick contacts. Pt denies any hematemesis or blood in her stools. Pt NSR on the monitor. She appears very anxious and states she is SOB. Pt reports her only abd surgeries include cholecystectomy and a partial hysterectomy. O2 100% on RA. Pt A&Ox4, resp e/u, and skin warm and dry.

## 2014-04-14 NOTE — Discharge Instructions (Signed)
Take loperamide (Imodium AD) as needed for diarrhea.  Nausea and Vomiting Nausea is a sick feeling that often comes before throwing up (vomiting). Vomiting is a reflex where stomach contents come out of your mouth. Vomiting can cause severe loss of body fluids (dehydration). Children and elderly adults can become dehydrated quickly, especially if they also have diarrhea. Nausea and vomiting are symptoms of a condition or disease. It is important to find the cause of your symptoms. CAUSES   Direct irritation of the stomach lining. This irritation can result from increased acid production (gastroesophageal reflux disease), infection, food poisoning, taking certain medicines (such as nonsteroidal anti-inflammatory drugs), alcohol use, or tobacco use.  Signals from the brain.These signals could be caused by a headache, heat exposure, an inner ear disturbance, increased pressure in the brain from injury, infection, a tumor, or a concussion, pain, emotional stimulus, or metabolic problems.  An obstruction in the gastrointestinal tract (bowel obstruction).  Illnesses such as diabetes, hepatitis, gallbladder problems, appendicitis, kidney problems, cancer, sepsis, atypical symptoms of a heart attack, or eating disorders.  Medical treatments such as chemotherapy and radiation.  Receiving medicine that makes you sleep (general anesthetic) during surgery. DIAGNOSIS Your caregiver may ask for tests to be done if the problems do not improve after a few days. Tests may also be done if symptoms are severe or if the reason for the nausea and vomiting is not clear. Tests may include:  Urine tests.  Blood tests.  Stool tests.  Cultures (to look for evidence of infection).  X-rays or other imaging studies. Test results can help your caregiver make decisions about treatment or the need for additional tests. TREATMENT You need to stay well hydrated. Drink frequently but in small amounts.You may wish to  drink water, sports drinks, clear broth, or eat frozen ice pops or gelatin dessert to help stay hydrated.When you eat, eating slowly may help prevent nausea.There are also some antinausea medicines that may help prevent nausea. HOME CARE INSTRUCTIONS   Take all medicine as directed by your caregiver.  If you do not have an appetite, do not force yourself to eat. However, you must continue to drink fluids.  If you have an appetite, eat a normal diet unless your caregiver tells you differently.  Eat a variety of complex carbohydrates (rice, wheat, potatoes, bread), lean meats, yogurt, fruits, and vegetables.  Avoid high-fat foods because they are more difficult to digest.  Drink enough water and fluids to keep your urine clear or pale yellow.  If you are dehydrated, ask your caregiver for specific rehydration instructions. Signs of dehydration may include:  Severe thirst.  Dry lips and mouth.  Dizziness.  Dark urine.  Decreasing urine frequency and amount.  Confusion.  Rapid breathing or pulse. SEEK IMMEDIATE MEDICAL CARE IF:   You have blood or brown flecks (like coffee grounds) in your vomit.  You have black or bloody stools.  You have a severe headache or stiff neck.  You are confused.  You have severe abdominal pain.  You have chest pain or trouble breathing.  You do not urinate at least once every 8 hours.  You develop cold or clammy skin.  You continue to vomit for longer than 24 to 48 hours.  You have a fever. MAKE SURE YOU:   Understand these instructions.  Will watch your condition.  Will get help right away if you are not doing well or get worse. Document Released: 09/02/2005 Document Revised: 11/25/2011 Document Reviewed: 01/30/2011 ExitCare Patient  Information ©2015 [employer]. This information is not intended to replace advice given to you by your health care provider. Make sure you discuss any questions you have with your health care  provider. ° °Diarrhea °Diarrhea is frequent loose and watery bowel movements. It can cause you to feel weak and dehydrated. Dehydration can cause you to become tired and thirsty, have a dry mouth, and have decreased urination that often is dark yellow. Diarrhea is a sign of another problem, most often an infection that will not last long. In most cases, diarrhea typically lasts 2-3 days. However, it can last longer if it is a sign of something more serious. It is important to treat your diarrhea as directed by your caregiver to lessen or prevent future episodes of diarrhea. °CAUSES  °Some common causes include: °· Gastrointestinal infections caused by viruses, bacteria, or parasites. °· Food poisoning or food allergies. °· Certain medicines, such as antibiotics, chemotherapy, and laxatives. °· Artificial sweeteners and fructose. °· Digestive disorders. °HOME CARE INSTRUCTIONS °· Ensure adequate fluid intake (hydration): Have 1 cup (8 oz) of fluid for each diarrhea episode. Avoid fluids that contain simple sugars or sports drinks, fruit juices, whole milk products, and sodas. Your urine should be clear or pale yellow if you are drinking enough fluids. Hydrate with an oral rehydration solution that you can purchase at pharmacies, retail stores, and online. You can prepare an oral rehydration solution at home by mixing the following ingredients together: °¨  - tsp table salt. °¨ ¾ tsp baking soda. °¨  tsp salt substitute containing potassium chloride. °¨ 1  tablespoons sugar. °¨ 1 L (34 oz) of water. °· Certain foods and beverages may increase the speed at which food moves through the gastrointestinal (GI) tract. These foods and beverages should be avoided and include: °¨ Caffeinated and alcoholic beverages. °¨ High-fiber foods, such as raw fruits and vegetables, nuts, seeds, and whole grain breads and cereals. °¨ Foods and beverages sweetened with sugar alcohols, such as xylitol, sorbitol, and mannitol. °· Some foods  may be well tolerated and may help thicken stool including: °¨ Starchy foods, such as rice, toast, pasta, low-sugar cereal, oatmeal, grits, baked potatoes, crackers, and bagels. °¨ Bananas. °¨ Applesauce. °· Add probiotic-rich foods to help increase healthy bacteria in the GI tract, such as yogurt and fermented milk products. °· Wash your hands well after each diarrhea episode. °· Only take over-the-counter or prescription medicines as directed by your caregiver. °· Take a warm bath to relieve any burning or pain from frequent diarrhea episodes. °SEEK IMMEDIATE MEDICAL CARE IF:  °· You are unable to keep fluids down. °· You have persistent vomiting. °· You have blood in your stool, or your stools are black and tarry. °· You do not urinate in 6-8 hours, or there is only a small amount of very dark urine. °· You have abdominal pain that increases or localizes. °· You have weakness, dizziness, confusion, or light-headedness. °· You have a severe headache. °· Your diarrhea gets worse or does not get better. °· You have a fever or persistent symptoms for more than 2-3 days. °· You have a fever and your symptoms suddenly get worse. °MAKE SURE YOU:  °· Understand these instructions. °· Will watch your condition. °· Will get help right away if you are not doing well or get worse. °Document Released: 08/23/2002 Document Revised: 01/17/2014 Document Reviewed: 05/10/2012 °ExitCare® Patient Information ©2015 [employer]. This information is not intended to replace advice given to you   by your health care provider. Make sure you discuss any questions you have with your health care provider.  Promethazine tablets What is this medicine? PROMETHAZINE (proe METH a zeen) is an antihistamine. It is used to treat allergic reactions and to treat or prevent nausea and vomiting from illness or motion sickness. It is also used to make you sleep before surgery, and to help treat pain or nausea after surgery. This medicine may be used  for other purposes; ask your health care provider or pharmacist if you have questions. COMMON BRAND NAME(S): Phenergan What should I tell my health care provider before I take this medicine? They need to know if you have any of these conditions: -glaucoma -high blood pressure or heart disease -kidney disease -liver disease -lung or breathing disease, like asthma -prostate trouble -pain or difficulty passing urine -seizures -an unusual or allergic reaction to promethazine or phenothiazines, other medicines, foods, dyes, or preservatives -pregnant or trying to get pregnant -breast-feeding How should I use this medicine? Take this medicine by mouth with a glass of water. Follow the directions on the prescription label. Take your doses at regular intervals. Do not take your medicine more often than directed. Talk to your pediatrician regarding the use of this medicine in children. Special care may be needed. This medicine should not be given to infants and children younger than 58 years old. Overdosage: If you think you have taken too much of this medicine contact a poison control center or emergency room at once. NOTE: This medicine is only for you. Do not share this medicine with others. What if I miss a dose? If you miss a dose, take it as soon as you can. If it is almost time for your next dose, take only that dose. Do not take double or extra doses. What may interact with this medicine? Do not take this medicine with any of the following medications: -cisapride -dofetilide -dronedarone -MAOIs like Carbex, Eldepryl, Marplan, Nardil, Parnate -pimozide -quinidine, including dextromethorphan; quinidine -thioridazine -ziprasidone This medicine may also interact with the following medications: -certain medicines for depression, anxiety, or psychotic disturbances -certain medicines for anxiety or sleep -certain medicines for seizures like carbamazepine, phenobarbital, phenytoin -certain  medicines for movement abnormalities as in Parkinson's disease, or for gastrointestinal problems -epinephrine -medicines for allergies or colds -muscle relaxants -narcotic medicines for pain -other medicines that prolong the QT interval (cause an abnormal heart rhythm) -tramadol -trimethobenzamide This list may not describe all possible interactions. Give your health care provider a list of all the medicines, herbs, non-prescription drugs, or dietary supplements you use. Also tell them if you smoke, drink alcohol, or use illegal drugs. Some items may interact with your medicine. What should I watch for while using this medicine? Tell your doctor or health care professional if your symptoms do not start to get better in 1 to 2 days. You may get drowsy or dizzy. Do not drive, use machinery, or do anything that needs mental alertness until you know how this medicine affects you. To reduce the risk of dizzy or fainting spells, do not stand or sit up quickly, especially if you are an older patient. Alcohol may increase dizziness and drowsiness. Avoid alcoholic drinks. Your mouth may get dry. Chewing sugarless gum or sucking hard candy, and drinking plenty of water may help. Contact your doctor if the problem does not go away or is severe. This medicine may cause dry eyes and blurred vision. If you wear contact lenses you may feel some  discomfort. Lubricating drops may help. See your eye doctor if the problem does not go away or is severe. This medicine can make you more sensitive to the sun. Keep out of the sun. If you cannot avoid being in the sun, wear protective clothing and use sunscreen. Do not use sun lamps or tanning beds/booths. If you are diabetic, check your blood-sugar levels regularly. What side effects may I notice from receiving this medicine? Side effects that you should report to your doctor or health care professional as soon as possible: -blurred vision -irregular heartbeat,  palpitations or chest pain -muscle or facial twitches -pain or difficulty passing urine -seizures -skin rash -slowed or shallow breathing -unusual bleeding or bruising -yellowing of the eyes or skin Side effects that usually do not require medical attention (report to your doctor or health care professional if they continue or are bothersome): -headache -nightmares, agitation, nervousness, excitability, not able to sleep (these are more likely in children) -stuffy nose This list may not describe all possible side effects. Call your doctor for medical advice about side effects. You may report side effects to FDA at 1-800-FDA-1088. Where should I keep my medicine? Keep out of the reach of children. Store at room temperature, between 20 and 25 degrees C (68 and 77 degrees F). Protect from light. Throw away any unused medicine after the expiration date. NOTE: This sheet is a summary. It may not cover all possible information. If you have questions about this medicine, talk to your doctor, pharmacist, or health care provider.  2015, Elsevier/Gold Standard. (2013-05-04 15:04:46)

## 2014-04-19 ENCOUNTER — Ambulatory Visit (HOSPITAL_COMMUNITY): Payer: Self-pay | Admitting: Professional Counselor

## 2014-04-22 ENCOUNTER — Other Ambulatory Visit: Payer: Self-pay | Admitting: General Practice

## 2014-04-22 ENCOUNTER — Encounter: Payer: Self-pay | Admitting: Family Medicine

## 2014-04-22 ENCOUNTER — Ambulatory Visit (INDEPENDENT_AMBULATORY_CARE_PROVIDER_SITE_OTHER): Payer: 59 | Admitting: Family Medicine

## 2014-04-22 VITALS — BP 134/80 | HR 89 | Temp 98.0°F | Resp 16 | Wt 240.2 lb

## 2014-04-22 DIAGNOSIS — F418 Other specified anxiety disorders: Secondary | ICD-10-CM

## 2014-04-22 DIAGNOSIS — F341 Dysthymic disorder: Secondary | ICD-10-CM

## 2014-04-22 DIAGNOSIS — R300 Dysuria: Secondary | ICD-10-CM

## 2014-04-22 DIAGNOSIS — R319 Hematuria, unspecified: Secondary | ICD-10-CM

## 2014-04-22 DIAGNOSIS — N760 Acute vaginitis: Secondary | ICD-10-CM | POA: Insufficient documentation

## 2014-04-22 LAB — POCT URINALYSIS DIPSTICK
Bilirubin, UA: NEGATIVE
Glucose, UA: NEGATIVE
Ketones, UA: NEGATIVE
Leukocytes, UA: NEGATIVE
NITRITE UA: NEGATIVE
PH UA: 7
Protein, UA: 100
SPEC GRAV UA: 1.01
Urobilinogen, UA: 0.2

## 2014-04-22 MED ORDER — CEPHALEXIN 500 MG PO CAPS
500.0000 mg | ORAL_CAPSULE | Freq: Two times a day (BID) | ORAL | Status: AC
Start: 2014-04-22 — End: 2014-05-02

## 2014-04-22 MED ORDER — CEPHALEXIN 500 MG PO CAPS: 500.0000 mg | ORAL_CAPSULE | Freq: Two times a day (BID) | ORAL | Status: DC

## 2014-04-22 MED ORDER — FLUTICASONE PROPIONATE 50 MCG/ACT NA SUSP: 2.0000 | Freq: Every day | NASAL | Status: DC

## 2014-04-22 MED ORDER — FLUCONAZOLE 150 MG PO TABS: 150.0000 mg | ORAL_TABLET | Freq: Once | ORAL | Status: DC

## 2014-04-22 NOTE — Patient Instructions (Signed)
Schedule your complete physical in 3 months Start the Keflex twice daily for the UTI Drink plenty of fluids Take 1 Diflucan now, repeat the 2nd dose in 3 days if needed Apply Nystatin/Miconazole cream twice daily for the external irritation Call with any questions or concerns You can do this!  Protect your heart!!

## 2014-04-22 NOTE — Progress Notes (Signed)
Pre visit review using our clinic review tool, if applicable. No additional management support is needed unless otherwise documented below in the visit note. 

## 2014-04-22 NOTE — Progress Notes (Signed)
   Subjective:    Patient ID: Stacey Alvarez, female    DOB: 12/07/1955, 58 y.o.   MRN: [id number]  HPI Dysuria- pt had 'horrible virus' last week (GI) and at that time developed burning w/ urination, increased frequency, hesitancy.  No suprapubic pain.  Having to wake during the night to void and this is unusual for pt.  No fevers.  + vaginal itching- no discharge.  Hx of similar.  Depression- pt's son is a homeless drug addict and this is very distressing for pt.  He has CF and is refusing to take care of himself (and his newly dx'd DM).  Pt is in therapy and seeing psychiatrist but is in tears here today.   Review of Systems For ROS see HPI     Objective:   Physical Exam  Vitals reviewed. Constitutional: She is oriented to person, place, and time. She appears well-developed and well-nourished. She appears distressed (tearful, difficulty speaking at times).  Abdominal: Soft. Bowel sounds are normal. She exhibits no distension. There is no tenderness (no suprapubic or CVA tenderness). There is no rebound and no guarding.  Neurological: She is alert and oriented to person, place, and time.  Skin: Skin is warm and dry.  Psychiatric:  Anxious, tearful, clearly depressed          Assessment & Plan:

## 2014-04-24 LAB — URINE CULTURE: Colony Count: 45000

## 2014-04-24 NOTE — Assessment & Plan Note (Signed)
New.  Pt's sxs and UA consistent w/ infxn.  Start abx.  Reviewed supportive care and red flags that should prompt return.  Pt expressed understanding and is in agreement w/ plan.  

## 2014-04-24 NOTE — Assessment & Plan Note (Signed)
Deteriorated due to son's recent bad choices.  Pt is seeing therapist and psychiatrist.  Encouraged her to continue to care for herself as she can't control her son's choices.  Will follow.

## 2014-04-24 NOTE — Assessment & Plan Note (Signed)
New.  Pt has hx of yeast w/ abx and due to recent GI illness, pt already has sxs.  Start Diflucan.  Reviewed supportive care and red flags that should prompt return.  Pt expressed understanding and is in agreement w/ plan.

## 2014-04-25 ENCOUNTER — Telehealth: Payer: Self-pay

## 2014-04-25 NOTE — Telephone Encounter (Signed)
When Stacey Alvarez was in on Friday she did not get a prescription Nysatin, could we call that in for her to [facility] on [address].

## 2014-04-26 ENCOUNTER — Encounter (HOSPITAL_COMMUNITY): Payer: Self-pay | Admitting: Professional Counselor

## 2014-04-26 ENCOUNTER — Ambulatory Visit (INDEPENDENT_AMBULATORY_CARE_PROVIDER_SITE_OTHER): Payer: 59 | Admitting: Professional Counselor

## 2014-04-26 DIAGNOSIS — F332 Major depressive disorder, recurrent severe without psychotic features: Secondary | ICD-10-CM

## 2014-04-26 MED ORDER — NYSTATIN 100000 UNIT/GM EX CREA: 1.0000 "application " | TOPICAL_CREAM | Freq: Two times a day (BID) | CUTANEOUS | Status: DC

## 2014-04-26 NOTE — Telephone Encounter (Signed)
Mediation filled.

## 2014-04-26 NOTE — Progress Notes (Signed)
   THERAPIST PROGRESS NOTE  Session Time: 11:00am-12:00pm  Participation Level: Active  Behavioral Response: CasualAlertDepressed  Type of Therapy: Family Therapy  Treatment Goals addressed: Diagnosis: Depression  Interventions: CBT and Solution Focused  Summary: Stacey Alvarez is a 58 y.o. female who presents with her husband for session.  [name] and her husband continue to work on addressing issues with adult son with substance abuse problems.  Patient son is back in the hospital and patient along with husband were able to process feelings of anger, frustration, manipulation with continued relapse of son.  LCSW used CBT to help patient and husband address personal boundaries, areas of control, and outcomes/goals for the two to work towards.  Husband and patient are supportive of one another and able to discuss stressors on their marriage as a direct result of the adult son's behavior.  Solution focused themes and education given to both about mental health system and  opportunities to try new ideas directly related to decreasing depression.  Suicidal/Homicidal: Nowithout intent/plan  Therapist Response: Patient and husband have a strong support system enabling them to care for one another during this difficult time with their son.  Both are still working to accept son's behavior, control factors, and plans for implementing boundaries.  Both have been through extensive therapy and aware of the options and what they have to do, yet struggle making the changes.  Grief process and stages of grief reviewed as well as CBT model in effort to show irrational thinking patterns and challenge behavior.  Homework for two is to create boundaries and dialogue plans for when son calls. Boundaries reviewed for both to define what is important to them as well as goals in effort change behaviors and increase positive emotions.    Plan: Return again in 2 weeks.  Diagnosis: Axis I: Major Depression, single  episode    Axis II: Deferred    Lilly Cove, LCSW 04/26/2014

## 2014-04-26 NOTE — Telephone Encounter (Signed)
[facility] for refill of nystatin? I do not see this on the medication list

## 2014-04-26 NOTE — Telephone Encounter (Signed)
Ok for Nystatin cream 100,000units/g- apply twice daily, #60, 1 refill

## 2014-05-03 ENCOUNTER — Ambulatory Visit (INDEPENDENT_AMBULATORY_CARE_PROVIDER_SITE_OTHER): Payer: 59 | Admitting: Physician Assistant

## 2014-05-03 ENCOUNTER — Encounter (HOSPITAL_COMMUNITY): Payer: Self-pay | Admitting: Physician Assistant

## 2014-05-03 VITALS — BP 158/82 | HR 98 | Ht 64.0 in | Wt 239.0 lb

## 2014-05-03 DIAGNOSIS — F411 Generalized anxiety disorder: Secondary | ICD-10-CM

## 2014-05-03 DIAGNOSIS — F332 Major depressive disorder, recurrent severe without psychotic features: Secondary | ICD-10-CM

## 2014-05-03 NOTE — Progress Notes (Signed)
   [facility] Follow-up Outpatient Visit  Stacey Alvarez 1956/04/12  Date: 05/03/2014    Subjective: [name] comes in today to followup on her depression with anxiety and medication management.  She launches into a long detailed description of difficulty with medication, stating that the Klonopin caused fluid retention dry mouth and kept her awake at night. Nor could she tolerate the increase in Lamictal to 150 mg, stating it also at night. She is convinced that the increase in the Effexor caused her to have night sweats and she couldn't take that increase either.  SFHx: Once again [name] continues to be focused on her son [name], who she states is going from hospital hospital, continuing to get narcotics, as well as drinking alcohol.    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 Take the Effexor in the AM to see if you can tolerate 150mg . 2. Continue the lamictal at 100mg . And take this in the AM. 3. Follow up as planned. 1 month. 4. Greater than 50% of the time spent with this patient is spent in counseling and education regarding medication management associated symptoms and side effects. Patient is also educated in thought blocking to decrease her negative pattern of thinking. Okechukwu Regnier, PA-C

## 2014-05-03 NOTE — Patient Instructions (Signed)
1. Take the Effexor in the AM to see if you can tolerate 150mg . 2. Continue the lamictal at 100mg . And take this in the AM. 3. Follow up as planned. 1 month.

## 2014-05-10 ENCOUNTER — Telehealth: Payer: Self-pay | Admitting: Family Medicine

## 2014-05-10 ENCOUNTER — Other Ambulatory Visit (INDEPENDENT_AMBULATORY_CARE_PROVIDER_SITE_OTHER): Payer: 59

## 2014-05-10 ENCOUNTER — Telehealth: Payer: Self-pay

## 2014-05-10 ENCOUNTER — Other Ambulatory Visit (INDEPENDENT_AMBULATORY_CARE_PROVIDER_SITE_OTHER): Payer: 59 | Admitting: Family Medicine

## 2014-05-10 ENCOUNTER — Ambulatory Visit (HOSPITAL_COMMUNITY): Payer: Self-pay | Admitting: Professional Counselor

## 2014-05-10 DIAGNOSIS — R300 Dysuria: Secondary | ICD-10-CM

## 2014-05-10 NOTE — Telephone Encounter (Signed)
Called pt husband back and advised that we needed a clean catch record. Pt husband coming to pick this up and will return urine sample.

## 2014-05-10 NOTE — Telephone Encounter (Signed)
Caller name: Quita Skye  Relation to pt: spouse  Call back number: [phone]    Reason for call: Husband would like to drop off urine, wife is unable too. Please advise

## 2014-05-10 NOTE — Telephone Encounter (Signed)
Caller name:Chandni Relation to pt: Call back number:[phone] Pharmacy: [facility]  Reason for call: Nadeen called to say her UTI has come back, when she was here before on 04/22/14 she did not take all of her medicine then, and now it has come back. She wants to know if Dr Birdie Riddle will call her something else in.

## 2014-05-10 NOTE — Telephone Encounter (Signed)
Stacey Alvarez dipped urine but stated that pt must have used AZO no results were possible. Urine sent for culture

## 2014-05-10 NOTE — Telephone Encounter (Signed)
Spouse dropped off urine.  Spouse stated the last time an antibiotic was prescribed for this, it was Cephalexin, pt had upset stomach from it and would like something else if possible.

## 2014-05-10 NOTE — Telephone Encounter (Signed)
Pt will at least need to drop off a urine sample so we can run a UA and get a culture (especially now that we're possibly dealing w/ drug resistance)

## 2014-05-10 NOTE — Telephone Encounter (Signed)
Notified pt who advised that she would not come in to give a sample. If she was still feeling bad she would call back and schedule an appt.

## 2014-05-10 NOTE — Telephone Encounter (Signed)
Caller name: Fredna  Relation to pt: self  Call back number: [phone]   Reason for call: pt would like to know the results from urine drop off today.

## 2014-05-11 NOTE — Telephone Encounter (Signed)
Will await cx results prior to prescribing abx.

## 2014-05-11 NOTE — Telephone Encounter (Signed)
Spoke with pt and advised that with the AZO we could not see the results, need to wait for culture to come back.

## 2014-05-12 LAB — URINE CULTURE
Colony Count: NO GROWTH
ORGANISM ID, BACTERIA: NO GROWTH

## 2014-05-17 ENCOUNTER — Ambulatory Visit (INDEPENDENT_AMBULATORY_CARE_PROVIDER_SITE_OTHER): Payer: 59 | Admitting: Professional Counselor

## 2014-05-17 ENCOUNTER — Encounter (HOSPITAL_COMMUNITY): Payer: Self-pay | Admitting: Professional Counselor

## 2014-05-17 DIAGNOSIS — F332 Major depressive disorder, recurrent severe without psychotic features: Secondary | ICD-10-CM

## 2014-05-17 NOTE — Progress Notes (Signed)
   THERAPIST PROGRESS NOTE  Session Time: 11:00am-11:50am  Participation Level: Active  Behavioral Response: CasualAlertAnxious  Type of Therapy: Family Therapy  With husband  Treatment Goals addressed: Coping  Depression related around patient son and substance abuse  Interventions: CBT and Solution Focused  Summary: Stacey Alvarez is a 58 y.o. female who presents with her husband for session today.  Session focused again on feelings related to accepting son's maladaptive behaviors and how to cope.   Patient and husband have started working on their control issues with son and being less involved in changing his behavior, but allowing him to make his own decisions and accepting his outcomes. Parents remain invested in son's treatment and addressing their irrational thoughts vs what is really going on and how they handle stress.  LCSW discussed in last session for both to attend Al-Anon for additional support. They will begin groups this week.  Other community resources were given to family in addition to help with coping and support.  Suicidal/Homicidal: Nowithout intent/plan  Therapist Response: Patient and husband are making progress on controlling their thoughts related to son's behavior and not letting it interfere with their marriage and life.  They are more future oriented in thinking and planning for an upcoming vacation. In the past they have cancelled their trips, but with therapy they have been encourage to attend and enjoy.  Communication remains key for the two to have positive outcomes and they continue to spend time together in effort to support one another as their son begins to take care of himself. Boundaries and limits have improved with patient and husband as they are no longer giving him money or trying to help him.  They are learning and working to allow him to make changes and be there as a support when he is ready.    Plan: Return again in 1 weeks.  Diagnosis: Axis I:  Major Depression, single episode    Axis II: Deferred    Lilly Cove, LCSW 05/17/2014

## 2014-05-19 ENCOUNTER — Telehealth: Payer: Self-pay | Admitting: Family Medicine

## 2014-05-19 ENCOUNTER — Ambulatory Visit (INDEPENDENT_AMBULATORY_CARE_PROVIDER_SITE_OTHER): Payer: 59 | Admitting: Medical

## 2014-05-19 ENCOUNTER — Encounter: Payer: Self-pay | Admitting: Medical

## 2014-05-19 VITALS — BP 150/78 | HR 85 | Temp 97.7°F | Ht 65.0 in | Wt 240.8 lb

## 2014-05-19 DIAGNOSIS — R3989 Other symptoms and signs involving the genitourinary system: Secondary | ICD-10-CM

## 2014-05-19 DIAGNOSIS — N390 Urinary tract infection, site not specified: Secondary | ICD-10-CM

## 2014-05-19 DIAGNOSIS — R319 Hematuria, unspecified: Secondary | ICD-10-CM

## 2014-05-19 LAB — POCT URINALYSIS DIPSTICK
Blood, UA: NEGATIVE
GLUCOSE UA: NEGATIVE
Glucose, UA: NEGATIVE
Ketones, UA: NEGATIVE
Ketones, UA: NEGATIVE
NITRITE UA: POSITIVE
Nitrite, UA: POSITIVE
PH UA: 7
PROTEIN UA: 300
Protein, UA: 300
RBC UA: 5
SPEC GRAV UA: 1.025
Spec Grav, UA: 1.025
UROBILINOGEN UA: 1
Urobilinogen, UA: 1
pH, UA: 5

## 2014-05-19 MED ORDER — FLUCONAZOLE 150 MG PO TABS: 150.0000 mg | ORAL_TABLET | Freq: Once | ORAL | Status: DC

## 2014-05-19 MED ORDER — NITROFURANTOIN MONOHYD MACRO 100 MG PO CAPS: 100.0000 mg | ORAL_CAPSULE | Freq: Two times a day (BID) | ORAL | Status: DC

## 2014-05-19 NOTE — Assessment & Plan Note (Signed)
Patient is having recurrent symptoms over the past couple of months. Her last culture was negative although she did get better symptomatically with Keflex. Her urine looked suspicious today and we'll repeat cultures. I am prescribing Macrobid. If her urine culture is negative then will refer her to urology for evaluation of possible interstitial cystitis. If her urine is positive and she gets better and will follow. But if she gets recurrent reinfection in a short time interval would refer to urologist as well.

## 2014-05-19 NOTE — Progress Notes (Signed)
   Subjective:    Patient ID: Stacey Alvarez, female    DOB: March 01, 1956, 58 y.o.   MRN: [id number]  HPI  Pt in with some frequent urination with some suprapubic pain. Also some nausea. Pt states she has intermittent urinary symptoms for 5-6 weeks. Pt has some cephalexin in august. She had no growth but she had a lot of symptoms. Pt states cephalexin did seem to help but it bothered her stomach. Pt states cipro did not upset her stomach. No fever, no chills. She does have dysuria. Most recent reoccurence for 3 days.     Review of Systems  Constitutional: Negative for fever, chills and fatigue.  Respiratory: Negative for cough, chest tightness and wheezing.   Cardiovascular: Negative for chest pain and palpitations.  Gastrointestinal: Positive for nausea. Negative for vomiting, abdominal pain, constipation, blood in stool, abdominal distention, anal bleeding and rectal pain.       Only suprapubic tenderness.  Endocrine: Negative for polydipsia and polyphagia.  Genitourinary: Positive for dysuria, urgency and frequency. Negative for flank pain, vaginal discharge and pelvic pain.  Musculoskeletal: Negative for back pain.  Neurological: Negative.   Hematological: Negative.        Objective:   Physical Exam General  Mental Status- Alert. Orientation- Orientation x 4.   Skin General:- Normal. Moisture- Dry. Temperature- Warm.  Heart Ausculation-RRR  Lungs Ausculation- Clear, even, unlabored bilaterlly.    Abdomen Palpation/Percussion: Palpation and Percussion of the abdomen reveal- Non Tender, No Rebound tenderness, No Rigidity(guarding), No Palpable abdominal masses and No jar tenderness. Only suprapubic tenderness. Liver:-Normal. Spleen:- Normal. Other Characteristics- No Costovertebral angle tenderness- Left or Costovertebral angle tenderness- Right.  Auscultation: Auscultation of the abdomen reveals- Bowel Sounds normal.  Back No CVA tenderness.      Assessment & Plan:

## 2014-05-19 NOTE — Patient Instructions (Addendum)
You do appear to have UTI based on symptoms and urinalysis. I am sending your antibiotic prescription to your pharmacy. I am sending culture of your urine out. We will call you with results when urine culture results come in. Follow up in 7 days or as  needed. If recurrent urine infections or culture come back negative will consider referral to urologist. As sometimes interstitial cystitis can cause symptoms with no bacteria present.   Patient at the and of the exam wanted me to write a prescription for CPAP supplies. She is not aware of when her last face-to-face exam with provider was done/regarding sleep apnea(CPAP). She is a new patient to our practice. I was running behind and  I informed her that I did not have the time to address this today. She can get the number of [facility] for me and I could call them and see if we could simply provide them with a prescription. If this is not the case then  I'll ask her to come in for face-to-face exam with Dr. Birdie Riddle. The may be required of her husband as well.

## 2014-05-19 NOTE — Telephone Encounter (Signed)
Caller name: Razan   Reason for call:  Pt called in with information to give to [place].   [facility] in [place], [place]  For husband as well.  Stacey Alvarez DOB 12/01/1949

## 2014-05-21 LAB — CULTURE, URINE COMPREHENSIVE
Colony Count: NO GROWTH
Organism ID, Bacteria: NO GROWTH

## 2014-05-21 NOTE — Telephone Encounter (Signed)
Will get LPN to call pt and advise her urine culture negative. So if symptoms persisting like infection will refer to urologist for eval of intersititial cystitis. I plan on calling [facility] and investigating supply issue. To see what supply she needs, ask about settings, and if they have any info on when last study was done. I reviewed in chart and we have no information. She may need a separate appointment or may need new sleep study(Does she know when last study was done/)

## 2014-05-24 ENCOUNTER — Ambulatory Visit (HOSPITAL_COMMUNITY): Payer: Self-pay | Admitting: Professional Counselor

## 2014-05-24 ENCOUNTER — Encounter: Payer: Self-pay | Admitting: Family Medicine

## 2014-05-24 DIAGNOSIS — G4733 Obstructive sleep apnea (adult) (pediatric): Secondary | ICD-10-CM

## 2014-05-24 DIAGNOSIS — Z9989 Dependence on other enabling machines and devices: Principal | ICD-10-CM

## 2014-05-24 NOTE — Telephone Encounter (Signed)
Patient states her symptoms are better with the ABT. States she does not know/remember what her setting are and doesn't remember when last study was done. States [facility] should has all that information.

## 2014-05-25 ENCOUNTER — Encounter: Payer: Self-pay | Admitting: General Practice

## 2014-05-25 NOTE — Telephone Encounter (Signed)
dme written.

## 2014-05-27 ENCOUNTER — Encounter (HOSPITAL_COMMUNITY): Payer: Self-pay | Admitting: Professional Counselor

## 2014-05-27 ENCOUNTER — Ambulatory Visit (INDEPENDENT_AMBULATORY_CARE_PROVIDER_SITE_OTHER): Payer: 59 | Admitting: Professional Counselor

## 2014-05-27 DIAGNOSIS — F332 Major depressive disorder, recurrent severe without psychotic features: Secondary | ICD-10-CM

## 2014-05-27 NOTE — Progress Notes (Signed)
   THERAPIST PROGRESS NOTE  Session Time: 11:00am-12:15pm  Participation Level: Active  Behavioral Response: CasualAlertEuthymic  Type of Therapy: Individual Therapy  Treatment Goals addressed: Relationship with Son, Boundaries   Interventions: CBT and Supportive  Summary: Paitynn Mikus is a 58 y.o. female who presents with euthymic mood ready to discuss the issues of his son and substance abuse.  Patient wife was unable to make appointment due to feeling bad and resting.  Husband arrived and most of session was used to discuss boundaries, feelings around limit setting and learning what to control vs what he cannot.  He discusses feelings of anger and resitenment towards son and negative behaviors and not accepting help leading session into use of CBT in effort to address what if thoughts and irrational thoughts.  Mr. Kegg has made progress in opening up about his feelings regarding his son and son not wanting a relationship with parents.  He remains stuck on trying to find the right answer or a different answer in effort to help son.  He is able to rationalize that this may never happen, but in effort to not let it take over his thoughts he is still working on acceptance.    Suicidal/Homicidal: Nowithout intent/plan    Plan: This was patient's last appointment due to maternity leave.  Patient has been referred to [facility] to [employer]. Patient  Is to call and schedule appointment and given information.  Diagnosis: Axis I: MDD. Recurrent, moderate    Axis II: Deferred    Lilly Cove, LCSW 05/27/2014

## 2014-05-31 ENCOUNTER — Ambulatory Visit (HOSPITAL_COMMUNITY): Payer: Self-pay | Admitting: Professional Counselor

## 2014-06-07 ENCOUNTER — Ambulatory Visit (HOSPITAL_COMMUNITY): Payer: Self-pay | Admitting: Professional Counselor

## 2014-06-08 ENCOUNTER — Encounter: Payer: Self-pay | Admitting: Family Medicine

## 2014-06-15 ENCOUNTER — Encounter: Payer: Self-pay | Admitting: Family Medicine

## 2014-06-15 MED ORDER — AMLODIPINE BESYLATE 5 MG PO TABS: 5.0000 mg | ORAL_TABLET | Freq: Every day | ORAL | Status: DC

## 2014-06-15 NOTE — Telephone Encounter (Signed)
Med filled.  

## 2014-06-24 ENCOUNTER — Encounter: Payer: Self-pay | Admitting: Family Medicine

## 2014-06-28 ENCOUNTER — Other Ambulatory Visit: Payer: Self-pay

## 2014-06-28 DIAGNOSIS — Z1239 Encounter for other screening for malignant neoplasm of breast: Secondary | ICD-10-CM

## 2014-07-07 ENCOUNTER — Ambulatory Visit: Payer: Self-pay

## 2014-07-18 ENCOUNTER — Encounter: Payer: Self-pay | Admitting: Family Medicine

## 2014-07-18 MED ORDER — PROMETHAZINE-DM 6.25-15 MG/5ML PO SYRP: 5.0000 mL | ORAL_SOLUTION | Freq: Four times a day (QID) | ORAL | Status: DC | PRN

## 2014-07-20 ENCOUNTER — Ambulatory Visit: Payer: Self-pay

## 2014-07-26 ENCOUNTER — Encounter: Payer: Self-pay | Admitting: Family Medicine

## 2014-07-26 DIAGNOSIS — E538 Deficiency of other specified B group vitamins: Secondary | ICD-10-CM

## 2014-07-26 DIAGNOSIS — E559 Vitamin D deficiency, unspecified: Secondary | ICD-10-CM

## 2014-07-26 NOTE — Telephone Encounter (Signed)
Labs ordered and pt scheduled. ?

## 2014-07-27 ENCOUNTER — Other Ambulatory Visit: Payer: 59

## 2014-07-28 ENCOUNTER — Other Ambulatory Visit: Payer: 59

## 2014-07-28 ENCOUNTER — Telehealth: Payer: Self-pay | Admitting: *Deleted

## 2014-07-28 ENCOUNTER — Telehealth: Payer: Self-pay | Admitting: Medical

## 2014-07-28 ENCOUNTER — Ambulatory Visit (INDEPENDENT_AMBULATORY_CARE_PROVIDER_SITE_OTHER): Payer: 59 | Admitting: Medical

## 2014-07-28 ENCOUNTER — Encounter: Payer: Self-pay | Admitting: Medical

## 2014-07-28 VITALS — BP 145/76 | HR 96 | Temp 97.9°F | Ht 64.5 in | Wt 245.2 lb

## 2014-07-28 DIAGNOSIS — J0100 Acute maxillary sinusitis, unspecified: Secondary | ICD-10-CM | POA: Insufficient documentation

## 2014-07-28 DIAGNOSIS — Z8639 Personal history of other endocrine, nutritional and metabolic disease: Secondary | ICD-10-CM

## 2014-07-28 LAB — VITAMIN B12: Vitamin B-12: 421 pg/mL (ref 211–911)

## 2014-07-28 LAB — VITAMIN D 25 HYDROXY (VIT D DEFICIENCY, FRACTURES): VITD: 21.27 ng/mL — ABNORMAL LOW (ref 30.00–100.00)

## 2014-07-28 MED ORDER — ALBUTEROL SULFATE HFA 108 (90 BASE) MCG/ACT IN AERS: 2.0000 | INHALATION_SPRAY | Freq: Four times a day (QID) | RESPIRATORY_TRACT | Status: DC | PRN

## 2014-07-28 MED ORDER — VITAMIN D (ERGOCALCIFEROL) 1.25 MG (50000 UNIT) PO CAPS: ORAL_CAPSULE | ORAL | Status: DC

## 2014-07-28 MED ORDER — HYDROCOD POLST-CHLORPHEN POLST 10-8 MG/5ML PO LQCR: 5.0000 mL | Freq: Two times a day (BID) | ORAL | Status: DC | PRN

## 2014-07-28 MED ORDER — AMOXICILLIN-POT CLAVULANATE 875-125 MG PO TABS: 1.0000 | ORAL_TABLET | Freq: Two times a day (BID) | ORAL | Status: DC

## 2014-07-28 NOTE — Progress Notes (Signed)
Subjective:    Patient ID: Stacey Alvarez, female    DOB: Mar 09, 1956, 58 y.o.   MRN: [id number]  HPI  Pt in for nasal and chest congestion. Pt states this for 2 weeks. Feels like needs to cough up some mucous. Pt has been using some promethazine dm for cough but not helping. No wheezing but some last week. Non smoker. Pt son has cystic fibrosis. Recent culture of his sputum showed pseudomonas and staph.  Past Medical History  Diagnosis Date  . Hypertension   . Anxiety   . Sleep apnea   . Fibromyalgia   . IBS (irritable bowel syndrome)   . Depression     History   Social History  . Marital Status: Married    Spouse Name: N/A    Number of Children: N/A  . Years of Education: N/A   Occupational History  . Not on file.   Social History Main Topics  . Smoking status: Never Smoker   . Smokeless tobacco: Never Used  . Alcohol Use: Yes  . Drug Use: No  . Sexual Activity: Yes   Other Topics Concern  . Not on file   Social History Narrative    Past Surgical History  Procedure Laterality Date  . Partial hysterectomy  1999  . Cholecystectomy      Family History  Problem Relation Age of Onset  . Diabetes Mother   . Diabetes Father   . Diabetes Paternal Grandmother   . Heart disease Mother   . Heart disease Father   . Anxiety disorder Mother   . Anxiety disorder Father   . Depression Mother   . Depression Father     Allergies  Allergen Reactions  . Sulfa Antibiotics Rash    Current Outpatient Prescriptions on File Prior to Visit  Medication Sig Dispense Refill  . ALPRAZolam (XANAX) 0.5 MG tablet Take 0.5 mg by mouth as needed for anxiety.    Marland Kitchen amLODipine (NORVASC) 5 MG tablet Take 1 tablet (5 mg total) by mouth at bedtime. 90 tablet 0  . butalbital-aspirin-caffeine-codeine (FIORINAL WITH CODEINE) 50-325-40-30 MG capsule     . DEXILANT 60 MG capsule Take 1 capsule by mouth daily.    Marland Kitchen estrogens, conjugated, (PREMARIN) 0.625 MG tablet Take 0.625 mg by mouth  daily.    . fluconazole (DIFLUCAN) 150 MG tablet Take 1 tablet (150 mg total) by mouth once. Repeat in 3 days if needed 2 tablet 0  . fluticasone (FLONASE) 50 MCG/ACT nasal spray Place 2 sprays into both nostrils daily. 16 g 6  . lisinopril (PRINIVIL,ZESTRIL) 20 MG tablet Take 1 tablet by mouth 2 (two) times daily.    . nitrofurantoin, macrocrystal-monohydrate, (MACROBID) 100 MG capsule Take 1 capsule (100 mg total) by mouth 2 (two) times daily. 14 capsule 0  . nystatin cream (MYCOSTATIN) Apply 1 application topically 2 (two) times daily. 60 g 1  . promethazine (PHENERGAN) 25 MG tablet Take 1 tablet (25 mg total) by mouth every 6 (six) hours as needed for nausea or vomiting. 30 tablet 0  . promethazine-dextromethorphan (PROMETHAZINE-DM) 6.25-15 MG/5ML syrup Take 5 mLs by mouth 4 (four) times daily as needed. 240 mL 0  . temazepam (RESTORIL) 30 MG capsule Take 30 mg by mouth at bedtime.    Marland Kitchen venlafaxine (EFFEXOR) 75 MG tablet Take 75 mg by mouth 2 (two) times daily.     No current facility-administered medications on file prior to visit.    BP 145/76 mmHg  Pulse 96  Temp(Src)  97.9 F (36.6 C) (Oral)  Ht 5' 4.5" (1.638 m)  Wt 245 lb 3.2 oz (111.222 kg)  BMI 41.45 kg/m2  SpO2 97%    Review of Systems  Constitutional: Negative for fever, chills and fatigue.  HENT: Positive for congestion and sinus pressure. Negative for dental problem, ear pain, facial swelling, postnasal drip and sneezing.   Respiratory: Positive for cough. Negative for choking and wheezing.   Cardiovascular: Negative for chest pain and palpitations.  Neurological: Negative.   Hematological: Negative for adenopathy. Does not bruise/bleed easily.       Objective:   Physical Exam  General  Mental Status - Alert. General Appearance - Well groomed. Not in acute distress.  Skin Rashes- No Rashes.  HEENT Head- Normal. Ear Auditory Canal - Left- Normal. Right - Normal.Tympanic Membrane- Left- Normal. Right-  Normal. Eye Sclera/Conjunctiva- Left- Normal. Right- Normal. Nose & Sinuses Nasal Mucosa- Left- Boggy   + Congested. Right-  Boggy + Congested. Maxillary and frontal sinus pressure Mouth & Throat Lips: Upper Lip- Normal: no dryness, cracking, pallor, cyanosis, or vesicular eruption. Lower Lip-Normal: no dryness, cracking, pallor, cyanosis or vesicular eruption. Buccal Mucosa- Bilateral- No Aphthous ulcers. Oropharynx- No Discharge or Erythema. Tonsils: Characteristics- Bilateral- No Erythema or Congestion. Size/Enlargement- Bilateral- No enlargement. Discharge- bilateral-None.  Neck Neck- Supple. No Masses.   Chest and Lung Exam Auscultation: Breath Sounds:-Normal. CTA.  Cardiovascular Auscultation:Rythm- Regular, Rate and Rhythm Murmurs & Other Heart Sounds:Ausculatation of the heart reveal- No Murmurs.  Lymphatic Head & Neck General Head & Neck Lymphatics: Bilateral: Description- No Localized lymphadenopathy.         Assessment & Plan:

## 2014-07-28 NOTE — Patient Instructions (Addendum)
For your sinusitis and possible early bronchitis, I will prescribe augmentin antibiotic. For the cough tussionex. For any wheezing albuterol inhaler.  Note regarding antibiotic I considered sulfa and levofloxin (Due to son culture results)but you have allergy or side effects. If your symptoms persist or worsen then rocephin IM is an option in addition to augmentin.  Follow up in 7 days or as needed.  At the very end pt mentioned lower legs mild achy. She states hx of this before when had low b-12 and vitamin d so I got those levels today. She mentioned no edema to me and no pain in popliteal areas.

## 2014-07-28 NOTE — Progress Notes (Signed)
Pre visit review using our clinic review tool, if applicable. No additional management support is needed unless otherwise documented below in the visit note. 

## 2014-07-28 NOTE — Telephone Encounter (Signed)
rx ergocalciferol for low vitamin d.

## 2014-07-28 NOTE — Assessment & Plan Note (Signed)
For your sinusitis and possible early bronchitis, I will prescribe augmentin antibiotic. For the cough tussionex. For any wheezing albuterol inhaler.  Note regarding antibiotic I considered sulfa and levofloxin (Due to son culture results)but you have allergy or side effects. If your symptoms persist or worsen then rocephin IM is an option in addition to augmentin

## 2014-07-28 NOTE — Telephone Encounter (Signed)
PA initiated for Tussionex. Awaiting determination. JG//CMA

## 2014-07-29 NOTE — Telephone Encounter (Signed)
Advised patient medication sent to pharmacy

## 2014-07-29 NOTE — Telephone Encounter (Signed)
[facility] states this medication is on patient's formulary and should be able to have it filled at pharmacy. JG//CMA

## 2014-08-09 ENCOUNTER — Ambulatory Visit (INDEPENDENT_AMBULATORY_CARE_PROVIDER_SITE_OTHER): Payer: 59 | Admitting: Family Medicine

## 2014-08-09 ENCOUNTER — Encounter: Payer: Self-pay | Admitting: Family Medicine

## 2014-08-09 VITALS — BP 148/80 | HR 86 | Temp 97.9°F | Resp 18 | Wt 247.4 lb

## 2014-08-09 DIAGNOSIS — R059 Cough, unspecified: Secondary | ICD-10-CM | POA: Insufficient documentation

## 2014-08-09 DIAGNOSIS — F418 Other specified anxiety disorders: Secondary | ICD-10-CM

## 2014-08-09 DIAGNOSIS — R05 Cough: Secondary | ICD-10-CM

## 2014-08-09 MED ORDER — PREDNISONE 10 MG PO TABS: ORAL_TABLET | ORAL | Status: DC

## 2014-08-09 NOTE — Assessment & Plan Note (Signed)
Deteriorated.  Pt's depression is not well controlled.  Crying again in office.  Reports constant fatigue, pain, depression.  Pt asking if I believe in fibromyalgia.  Following w/ Dr Toy Care.  Encouraged her to call for f/u w/ psych and recommended switching to Cymbalta as it has a pain indication.  Will continue to follow.

## 2014-08-09 NOTE — Progress Notes (Signed)
Pre visit review using our clinic review tool, if applicable. No additional management support is needed unless otherwise documented below in the visit note. 

## 2014-08-09 NOTE — Patient Instructions (Signed)
Follow up in January to recheck mood/fatigue Start the Prednisone as directed- take w/ food Switch to Zyrtec Use the Flonase daily- 2 sprays each nostril Take Mucinex DM as needed for cough Drink plenty of fluids to wash off post-nasal drip REST! Ask Dr Toy Care to switch the Effexor to Cymbalta for better depression control and pain relief Call with any questions or concerns Hang in there! Happy Holidays!!!

## 2014-08-09 NOTE — Progress Notes (Signed)
   Subjective:    Patient ID: Stacey Alvarez, female    DOB: 1955-12-23, 58 y.o.   MRN: [id number]  HPI URI- seen 11/12 and started on Augmentin for sinusitis.  Pt reports she continues to have 'nagging cough' at night.  'dry and unproductive'.  Some help w/ Tussionex, no relief w/ phenergan.  Sinus pain and pressure is better.  No fevers.  Pt wears CPAP at night and cough is interfering.  + PND.  Using Flonase w/o relief.  Taking Claritin nightly.   Review of Systems For ROS see HPI     Objective:   Physical Exam  Constitutional: She appears well-developed and well-nourished. No distress.  HENT:  Head: Normocephalic and atraumatic.  Right Ear: Tympanic membrane normal.  Left Ear: Tympanic membrane normal.  Nose: Mucosal edema and rhinorrhea present. Right sinus exhibits no maxillary sinus tenderness and no frontal sinus tenderness. Left sinus exhibits no maxillary sinus tenderness and no frontal sinus tenderness.  Mouth/Throat: Mucous membranes are normal. Posterior oropharyngeal erythema (w/ PND) present.  Eyes: Conjunctivae and EOM are normal. Pupils are equal, round, and reactive to light.  Neck: Normal range of motion. Neck supple.  Cardiovascular: Normal rate, regular rhythm and normal heart sounds.   Pulmonary/Chest: Effort normal and breath sounds normal. No respiratory distress. She has no wheezes. She has no rales.  Lymphadenopathy:    She has no cervical adenopathy.  Vitals reviewed.         Assessment & Plan:

## 2014-08-09 NOTE — Assessment & Plan Note (Signed)
Pt's cough consistent w/ post-infectious cough/airway inflammation.  No evidence of ongoing bacterial infection, no need for additional antibiotics.  Change allergy meds.  Pred taper.  Reviewed supportive care and red flags that should prompt return.  Pt expressed understanding and is in agreement w/ plan.

## 2014-08-12 ENCOUNTER — Encounter: Payer: Self-pay | Admitting: Family Medicine

## 2014-08-22 ENCOUNTER — Encounter: Payer: Self-pay | Admitting: Family Medicine

## 2014-08-22 DIAGNOSIS — R05 Cough: Secondary | ICD-10-CM

## 2014-08-22 DIAGNOSIS — R059 Cough, unspecified: Secondary | ICD-10-CM

## 2014-08-22 NOTE — Telephone Encounter (Signed)
Xray placed. Pt notified.

## 2014-08-23 ENCOUNTER — Ambulatory Visit (HOSPITAL_BASED_OUTPATIENT_CLINIC_OR_DEPARTMENT_OTHER)
Admission: RE | Admit: 2014-08-23 | Discharge: 2014-08-23 | Disposition: A | Discharge status: Home / Self Care | Payer: 59 | Source: Ambulatory Visit | Attending: Family Medicine | Admitting: Family Medicine

## 2014-08-23 DIAGNOSIS — R05 Cough: Secondary | ICD-10-CM | POA: Diagnosis present

## 2014-08-23 DIAGNOSIS — R059 Cough, unspecified: Secondary | ICD-10-CM

## 2014-08-23 DIAGNOSIS — R0989 Other specified symptoms and signs involving the circulatory and respiratory systems: Secondary | ICD-10-CM | POA: Diagnosis not present

## 2014-08-24 ENCOUNTER — Other Ambulatory Visit: Payer: Self-pay | Admitting: General Practice

## 2014-08-24 MED ORDER — BECLOMETHASONE DIPROPIONATE 80 MCG/ACT IN AERS: 1.0000 | INHALATION_SPRAY | Freq: Two times a day (BID) | RESPIRATORY_TRACT | Status: DC | PRN

## 2014-08-26 ENCOUNTER — Other Ambulatory Visit: Payer: Self-pay | Admitting: Family Medicine

## 2014-08-26 ENCOUNTER — Encounter: Payer: Self-pay | Admitting: Family Medicine

## 2014-08-26 MED ORDER — TRAMADOL HCL 50 MG PO TABS: 50.0000 mg | ORAL_TABLET | Freq: Three times a day (TID) | ORAL | Status: DC | PRN

## 2014-08-26 MED ORDER — LOSARTAN POTASSIUM 100 MG PO TABS: 100.0000 mg | ORAL_TABLET | Freq: Every day | ORAL | Status: DC

## 2014-08-26 NOTE — Telephone Encounter (Signed)
Med filled.  

## 2014-10-04 ENCOUNTER — Ambulatory Visit: Payer: Self-pay | Admitting: Family Medicine

## 2014-10-25 ENCOUNTER — Encounter: Payer: Self-pay | Admitting: Family Medicine

## 2014-10-25 MED ORDER — BUTALBITAL-ASA-CAFF-CODEINE 50-325-40-30 MG PO CAPS: 1.0000 | ORAL_CAPSULE | Freq: Four times a day (QID) | ORAL | Status: DC | PRN

## 2014-10-25 NOTE — Telephone Encounter (Signed)
Per Dr. Birdie Riddle: [facility] to fill fiorinal w/ codeine, 1 tab q6 prn HA, #30, no refills

## 2014-10-26 ENCOUNTER — Ambulatory Visit: Payer: Self-pay

## 2014-11-03 ENCOUNTER — Ambulatory Visit: Payer: Self-pay

## 2014-11-16 ENCOUNTER — Ambulatory Visit: Payer: Self-pay

## 2014-11-17 ENCOUNTER — Ambulatory Visit
Admission: RE | Admit: 2014-11-17 | Discharge: 2014-11-17 | Disposition: A | Discharge status: Home / Self Care | Payer: 59 | Source: Ambulatory Visit

## 2014-11-17 DIAGNOSIS — Z1239 Encounter for other screening for malignant neoplasm of breast: Secondary | ICD-10-CM

## 2014-11-18 ENCOUNTER — Telehealth: Payer: Self-pay | Admitting: *Deleted

## 2014-11-18 ENCOUNTER — Encounter: Payer: Self-pay | Admitting: *Deleted

## 2014-11-18 NOTE — Addendum Note (Signed)
Addended by: Leticia Penna A on: 11/18/2014 11:01 AM   Modules accepted: Medications

## 2014-11-18 NOTE — Telephone Encounter (Signed)
Pre-Visit Call completed with patient and chart updated.   Pre-Visit Info documented in Specialty Comments under SnapShot.    

## 2014-11-21 ENCOUNTER — Other Ambulatory Visit: Payer: Self-pay | Admitting: General Practice

## 2014-11-21 ENCOUNTER — Encounter: Payer: Self-pay | Admitting: Family Medicine

## 2014-11-21 ENCOUNTER — Ambulatory Visit (INDEPENDENT_AMBULATORY_CARE_PROVIDER_SITE_OTHER): Payer: 59 | Admitting: Family Medicine

## 2014-11-21 ENCOUNTER — Encounter: Payer: Self-pay | Admitting: General Practice

## 2014-11-21 ENCOUNTER — Other Ambulatory Visit: Payer: Self-pay | Admitting: Family Medicine

## 2014-11-21 VITALS — BP 124/82 | HR 91 | Temp 97.9°F | Resp 16 | Ht 65.0 in | Wt 242.4 lb

## 2014-11-21 DIAGNOSIS — K219 Gastro-esophageal reflux disease without esophagitis: Secondary | ICD-10-CM

## 2014-11-21 DIAGNOSIS — Z0000 Encounter for general adult medical examination without abnormal findings: Secondary | ICD-10-CM | POA: Diagnosis not present

## 2014-11-21 DIAGNOSIS — E785 Hyperlipidemia, unspecified: Secondary | ICD-10-CM

## 2014-11-21 LAB — CBC WITH DIFFERENTIAL/PLATELET
BASOS ABS: 0 10*3/uL (ref 0.0–0.1)
Basophils Relative: 0.3 % (ref 0.0–3.0)
Eosinophils Absolute: 0.2 10*3/uL (ref 0.0–0.7)
Eosinophils Relative: 1.9 % (ref 0.0–5.0)
HEMATOCRIT: 38.2 % (ref 36.0–46.0)
Hemoglobin: 13.1 g/dL (ref 12.0–15.0)
LYMPHS ABS: 2 10*3/uL (ref 0.7–4.0)
LYMPHS PCT: 24.7 % (ref 12.0–46.0)
MCHC: 34.3 g/dL (ref 30.0–36.0)
MCV: 79.6 fl (ref 78.0–100.0)
Monocytes Absolute: 0.3 10*3/uL (ref 0.1–1.0)
Monocytes Relative: 4.1 % (ref 3.0–12.0)
NEUTROS ABS: 5.7 10*3/uL (ref 1.4–7.7)
Neutrophils Relative %: 69 % (ref 43.0–77.0)
Platelets: 287 10*3/uL (ref 150.0–400.0)
RBC: 4.79 Mil/uL (ref 3.87–5.11)
RDW: 13.7 % (ref 11.5–15.5)
WBC: 8.3 10*3/uL (ref 4.0–10.5)

## 2014-11-21 LAB — HEPATIC FUNCTION PANEL
ALT: 7 U/L (ref 0–35)
AST: 9 U/L (ref 0–37)
Albumin: 4.3 g/dL (ref 3.5–5.2)
Alkaline Phosphatase: 91 U/L (ref 39–117)
BILIRUBIN DIRECT: 0 mg/dL (ref 0.0–0.3)
BILIRUBIN TOTAL: 0.4 mg/dL (ref 0.2–1.2)
TOTAL PROTEIN: 7.3 g/dL (ref 6.0–8.3)

## 2014-11-21 LAB — LIPID PANEL
CHOL/HDL RATIO: 4
Cholesterol: 225 mg/dL — ABNORMAL HIGH (ref 0–200)
HDL: 52 mg/dL (ref >39.00)
NonHDL: 173
TRIGLYCERIDES: 382 mg/dL — AB (ref 0.0–149.0)
VLDL: 76.4 mg/dL — ABNORMAL HIGH (ref 0.0–40.0)

## 2014-11-21 LAB — BASIC METABOLIC PANEL
BUN: 11 mg/dL (ref 6–23)
CHLORIDE: 100 meq/L (ref 96–112)
CO2: 29 mEq/L (ref 19–32)
Calcium: 9.3 mg/dL (ref 8.4–10.5)
Creatinine, Ser: 0.61 mg/dL (ref 0.40–1.20)
GFR: 105.97 mL/min (ref >60.00)
GLUCOSE: 98 mg/dL (ref 70–99)
Potassium: 4.1 mEq/L (ref 3.5–5.1)
Sodium: 136 mEq/L (ref 135–145)

## 2014-11-21 LAB — TSH: TSH: 1.27 u[IU]/mL (ref 0.35–4.50)

## 2014-11-21 LAB — VITAMIN D 25 HYDROXY (VIT D DEFICIENCY, FRACTURES): VITD: 34.31 ng/mL (ref 30.00–100.00)

## 2014-11-21 LAB — LDL CHOLESTEROL, DIRECT: Direct LDL: 112 mg/dL

## 2014-11-21 MED ORDER — RANITIDINE HCL 150 MG PO TABS: 150.0000 mg | ORAL_TABLET | Freq: Two times a day (BID) | ORAL | Status: DC

## 2014-11-21 MED ORDER — ATORVASTATIN CALCIUM 20 MG PO TABS: 20.0000 mg | ORAL_TABLET | Freq: Every day | ORAL | Status: DC

## 2014-11-21 NOTE — Patient Instructions (Signed)
Follow up in 6 months to recheck BP We'll notify you of your lab results and make any changes if needed Start the Ranitidine twice daily in place of the [place] (I sent this to mail order) Keep up the good work on healthy diet and regular exercise Call with any questions or concerns Hang in there!!!

## 2014-11-21 NOTE — Assessment & Plan Note (Signed)
Pt's PE WNL.  UTD on GYN, mammo, colonoscopy, DEXA.  Check labs.  Anticipatory guidance provided.

## 2014-11-21 NOTE — Progress Notes (Signed)
   Subjective:    Patient ID: Stacey Alvarez, female    DOB: 06-18-1956, 59 y.o.   MRN: [id number]  HPI CPE- UTD on colonoscopy, mammo, DEXA.  No need for paps due to hysterectomy (GYN- [facility]).  Has lost 5 lbs since November.  GERD- pt has long hx of GERD, currently on Dexilant w/o breakthrough sxs.  Interested in switching meds.   Review of Systems Patient reports no vision/ hearing changes, adenopathy,fever, weight change,  persistant/recurrent hoarseness , swallowing issues, chest pain, palpitations, edema, persistant/recurrent cough, hemoptysis, dyspnea (rest/exertional/paroxysmal nocturnal), gastrointestinal bleeding (melena, rectal bleeding), abdominal pain, significant heartburn, bowel changes, GU symptoms (dysuria, hematuria, incontinence), Gyn symptoms (abnormal  bleeding, pain),  syncope, focal weakness, memory loss, numbness & tingling, skin/hair/nail changes, abnormal bruising or bleeding, anxiety, or depression.   Reviewed meds, allergies, problem list, and PMH in chart     Objective:   Physical Exam General Appearance:    Alert, cooperative, no distress, appears stated age  Head:    Normocephalic, without obvious abnormality, atraumatic  Eyes:    PERRL, conjunctiva/corneas clear, EOM's intact, fundi    benign, both eyes  Ears:    Normal TM's and external ear canals, both ears  Nose:   Nares normal, septum midline, mucosa normal, no drainage    or sinus tenderness  Throat:   Lips, mucosa, and tongue normal; teeth and gums normal  Neck:   Supple, symmetrical, trachea midline, no adenopathy;    Thyroid: no enlargement/tenderness/nodules  Back:     Symmetric, no curvature, ROM normal, no CVA tenderness  Lungs:     Clear to auscultation bilaterally, respirations unlabored  Chest Wall:    No tenderness or deformity   Heart:    Regular rate and rhythm, S1 and S2 normal, no murmur, rub   or gallop  Breast Exam:    Deferred to GYN  Abdomen:     Soft, non-tender, bowel sounds  active all four quadrants,    no masses, no organomegaly  Genitalia:    Deferred to GYN  Rectal:    Extremities:   Extremities normal, atraumatic, no cyanosis or edema  Pulses:   2+ and symmetric all extremities  Skin:   Skin color, texture, turgor normal, no rashes or lesions  Lymph nodes:   Cervical, supraclavicular, and axillary nodes normal  Neurologic:   CNII-XII intact, normal strength, sensation and reflexes    throughout          Assessment & Plan:

## 2014-11-21 NOTE — Progress Notes (Signed)
Pre visit review using our clinic review tool, if applicable. No additional management support is needed unless otherwise documented below in the visit note. 

## 2014-11-21 NOTE — Assessment & Plan Note (Signed)
Due to pt's chronic use of Dexilant and her concerns over recent articles about PPIs, will switch to Ranitidine BID.  Pt to monitor for recurrence of symptoms and alert me if things change.

## 2014-11-22 NOTE — Addendum Note (Signed)
Addended by: Kris Hartmann on: 11/22/2014 08:34 AM   Modules accepted: Orders, Medications

## 2014-11-22 NOTE — Telephone Encounter (Signed)
Med filled.  

## 2014-11-23 ENCOUNTER — Encounter: Payer: Self-pay | Admitting: Family Medicine

## 2014-12-01 ENCOUNTER — Encounter: Payer: Self-pay | Admitting: Family Medicine

## 2014-12-12 ENCOUNTER — Encounter: Payer: Self-pay | Admitting: Family Medicine

## 2014-12-12 ENCOUNTER — Ambulatory Visit: Payer: Self-pay | Admitting: Medical

## 2014-12-14 ENCOUNTER — Encounter: Payer: Self-pay | Admitting: Family Medicine

## 2014-12-14 ENCOUNTER — Telehealth: Payer: Self-pay | Admitting: Family Medicine

## 2014-12-14 ENCOUNTER — Other Ambulatory Visit: Payer: Self-pay | Admitting: Family Medicine

## 2014-12-14 MED ORDER — ESTROGENS CONJUGATED 0.625 MG PO TABS: 0.6250 mg | ORAL_TABLET | Freq: Every day | ORAL | Status: DC

## 2014-12-14 NOTE — Telephone Encounter (Signed)
Med filled to mail order.

## 2014-12-14 NOTE — Telephone Encounter (Signed)
Last OV 11/21/14 Fiorinal last filled 2916 #30 with 0

## 2014-12-14 NOTE — Telephone Encounter (Signed)
Med filled and pt notified.  

## 2014-12-14 NOTE — Telephone Encounter (Signed)
Med filled and faxed.  

## 2014-12-14 NOTE — Telephone Encounter (Signed)
error 

## 2014-12-28 ENCOUNTER — Other Ambulatory Visit: Payer: Self-pay | Admitting: Family Medicine

## 2014-12-28 NOTE — Telephone Encounter (Signed)
Med filled.  

## 2015-01-08 ENCOUNTER — Encounter: Payer: Self-pay | Admitting: Family Medicine

## 2015-01-09 ENCOUNTER — Encounter: Payer: Self-pay | Admitting: Family Medicine

## 2015-01-09 ENCOUNTER — Ambulatory Visit (INDEPENDENT_AMBULATORY_CARE_PROVIDER_SITE_OTHER): Payer: 59 | Admitting: Family Medicine

## 2015-01-09 VITALS — BP 138/84 | HR 100 | Resp 16 | Wt 234.2 lb

## 2015-01-09 DIAGNOSIS — K5793 Diverticulitis of intestine, part unspecified, without perforation or abscess with bleeding: Secondary | ICD-10-CM

## 2015-01-09 DIAGNOSIS — F418 Other specified anxiety disorders: Secondary | ICD-10-CM

## 2015-01-09 DIAGNOSIS — K5792 Diverticulitis of intestine, part unspecified, without perforation or abscess without bleeding: Secondary | ICD-10-CM | POA: Insufficient documentation

## 2015-01-09 LAB — CBC WITH DIFFERENTIAL/PLATELET
BASOS ABS: 0 10*3/uL (ref 0.0–0.1)
Basophils Relative: 0.4 % (ref 0.0–3.0)
EOS PCT: 1.2 % (ref 0.0–5.0)
Eosinophils Absolute: 0.1 10*3/uL (ref 0.0–0.7)
HCT: 40.8 % (ref 36.0–46.0)
HEMOGLOBIN: 14 g/dL (ref 12.0–15.0)
LYMPHS PCT: 20.7 % (ref 12.0–46.0)
Lymphs Abs: 2.4 10*3/uL (ref 0.7–4.0)
MCHC: 34.2 g/dL (ref 30.0–36.0)
MCV: 81.1 fl (ref 78.0–100.0)
MONO ABS: 0.7 10*3/uL (ref 0.1–1.0)
Monocytes Relative: 5.7 % (ref 3.0–12.0)
NEUTROS PCT: 72 % (ref 43.0–77.0)
Neutro Abs: 8.3 10*3/uL — ABNORMAL HIGH (ref 1.4–7.7)
PLATELETS: 306 10*3/uL (ref 150.0–400.0)
RBC: 5.03 Mil/uL (ref 3.87–5.11)
RDW: 14 % (ref 11.5–15.5)
WBC: 11.5 10*3/uL — AB (ref 4.0–10.5)

## 2015-01-09 LAB — HEPATIC FUNCTION PANEL
ALK PHOS: 83 U/L (ref 39–117)
ALT: 8 U/L (ref 0–35)
AST: 9 U/L (ref 0–37)
Albumin: 3.9 g/dL (ref 3.5–5.2)
Bilirubin, Direct: 0.1 mg/dL (ref 0.0–0.3)
Total Bilirubin: 0.4 mg/dL (ref 0.2–1.2)
Total Protein: 6.9 g/dL (ref 6.0–8.3)

## 2015-01-09 LAB — BASIC METABOLIC PANEL
BUN: 17 mg/dL (ref 6–23)
CHLORIDE: 101 meq/L (ref 96–112)
CO2: 26 mEq/L (ref 19–32)
Calcium: 8.9 mg/dL (ref 8.4–10.5)
Creatinine, Ser: 0.59 mg/dL (ref 0.40–1.20)
GFR: 110.08 mL/min (ref >60.00)
GLUCOSE: 114 mg/dL — AB (ref 70–99)
Potassium: 4.3 mEq/L (ref 3.5–5.1)
SODIUM: 134 meq/L — AB (ref 135–145)

## 2015-01-09 MED ORDER — PROMETHAZINE HCL 25 MG PO TABS: 25.0000 mg | ORAL_TABLET | Freq: Three times a day (TID) | ORAL | Status: DC | PRN

## 2015-01-09 MED ORDER — METRONIDAZOLE 500 MG PO TABS: 500.0000 mg | ORAL_TABLET | Freq: Three times a day (TID) | ORAL | Status: AC

## 2015-01-09 MED ORDER — PROMETHAZINE HCL 50 MG/ML IJ SOLN
25.0000 mg | Freq: Once | INTRAMUSCULAR | Status: AC
  Administered 2015-01-09: 25 mg via INTRAMUSCULAR

## 2015-01-09 MED ORDER — CIPROFLOXACIN HCL 500 MG PO TABS: 500.0000 mg | ORAL_TABLET | Freq: Two times a day (BID) | ORAL | Status: AC

## 2015-01-09 NOTE — Progress Notes (Signed)
Pre visit review using our clinic review tool, if applicable. No additional management support is needed unless otherwise documented below in the visit note. 

## 2015-01-09 NOTE — Patient Instructions (Signed)
Follow up as needed We'll notify you of your lab results and make any changes if needed This is diverticulitis- inflammation of the colon and bleeding is not uncommon Until the pain improves- it will be a clear diet Once pain improves, you can advance your diet as tolerated Use the promethazine for nausea Call with any questions or concerns- that's why we're here!! Hang in there!!!

## 2015-01-09 NOTE — Progress Notes (Signed)
   Subjective:    Patient ID: Stacey Alvarez, female    DOB: 02/21/1956, 59 y.o.   MRN: [id number]  HPI Blood in stool- pt is in tears when I enter the room.  Pt is under a lot of stress- husband is on verge of retirement but needs a hip replacement.  Son is in rehab in [place] for drugs and ETOH.  Psych reduced pt's anxiety meds late last week- from Effexor 225 to 150.  1st dose was Saturday.  'my stomach hurts so bad'.  Pt reports BRB and mucous since 6:30pm last night.  Pt had BBQ chicken, potato salad, green beans, slaw for lunch when she started feeling ill but she didn't eat any differently than rest of the family. 'i'm so clammy'.  + nausea, no vomiting.  Last normal BM was 'Friday or Saturday'.  Denies recent constipation.  Pt has hx of colon polyps but no known diverticulitis.  Yesterday abdominal pain was epigastric but today is bilateral lower quandrants.  Blood only occurs w/ attempted BMs- not leaking otherwise.   Review of Systems For ROS see HPI    Objective:   Physical Exam  Constitutional: She is oriented to person, place, and time. She appears well-developed and well-nourished. She appears distressed (extremely anxious, tearful, shaking).  HENT:  Head: Normocephalic and atraumatic.  Cardiovascular: Regular rhythm, normal heart sounds and intact distal pulses.   Tachy but regular S1/S2  Pulmonary/Chest: Effort normal and breath sounds normal. No respiratory distress. She has no wheezes. She has no rales.  Abdominal: Soft. Bowel sounds are normal. She exhibits no distension and no mass. There is tenderness (LLQ TTP out of proportion to exam). There is no rebound and no guarding.  Musculoskeletal: She exhibits no edema.  Neurological: She is alert and oriented to person, place, and time.  Skin: Skin is warm and dry. No rash noted. No erythema.  Psychiatric:  Tearful, anxious, keeps apologizing for behavior  Vitals reviewed.         Assessment & Plan:

## 2015-01-10 NOTE — Assessment & Plan Note (Signed)
Deteriorated.  Pt in full blown panic attack today in office.  Pt had her Effexor dose decreased by Psych last week and she is having severe anxiety- coupled w/ withdrawal symptoms.  Strongly encouraged husband to report current situation to psych as it appears pt needs to go back up on her dose.  Will follow closely.

## 2015-01-10 NOTE — Assessment & Plan Note (Signed)
New.  Pt's sxs and PE consistent w/ diverticulitis.  Due to ability to tolerate PO intake, will attempt to 1st treat as outpt w/ Cipro and Flagyl.  Pt given phenergan injxn in office today both for nausea and to calm her rising panic.  Get stat labs to assess WBC and electrolytes.  If WBCs are extremely elevated, may need to consider hospital admission.  Reviewed need for clear liquid diet until pain resolves.  Reviewed supportive care and red flags that should prompt return.  Pt expressed understanding and is in agreement w/ plan.

## 2015-01-15 ENCOUNTER — Telehealth: Payer: Self-pay | Admitting: Family

## 2015-01-15 DIAGNOSIS — N760 Acute vaginitis: Secondary | ICD-10-CM

## 2015-01-15 MED ORDER — FLUCONAZOLE 150 MG PO TABS: 150.0000 mg | ORAL_TABLET | Freq: Once | ORAL | Status: DC

## 2015-01-15 NOTE — Progress Notes (Signed)
We are sorry that you are not feeling well.  Here is how we plan to help!  Based on what you shared with me it looks like you most likely have a yeast infection.  Vaginitis in a soreness, swelling and redness (inflammation) of the vagina and vulva. Monilial vaginitis is not a sexually transmitted infection.  I have prescribed you Fluconazole (Diflucan) 150 mg tablet that you take by mouth once.   CAUSES  Yeast vaginitis is caused by yeast (candida) that is normally found in your vagina. With a yeast infection, the candida has overgrown in number to a point that upsets the chemical balance. SYMPTOMS   White, thick vaginal discharge.  Swelling, itching, redness and irritation of the vagina and possibly the lips of the vagina (vulva).  Burning or painful urination.  Painful intercourse. DIAGNOSIS  Things that may contribute to monilial vaginitis are:  Postmenopausal and virginal states.  Pregnancy.  Infections.  Being tired, sick or stressed, especially if you had monilial vaginitis in the past.  Diabetes. Good control will help lower the chance.  Birth control pills.  Tight fitting garments.  Using bubble bath, feminine sprays, douches or deodorant tampons.  Taking certain medications that kill germs (antibiotics).  Sporadic recurrence can occur if you become ill. TREATMENT  Your caregiver will give you medication.  There are several kinds of anti monilial vaginal creams and suppositories specific for monilial vaginitis. For recurrent yeast infections, use a suppository or cream in the vagina 2 times a week, or as directed.  Anti-monilial or steroid cream for the itching or irritation of the vulva may also be used. Get your caregiver's permission.  Painting the vagina with methylene blue solution may help if the monilial cream does not work.  Eating yogurt may help prevent monilial vaginitis. HOME CARE INSTRUCTIONS   Finish all medication as prescribed.  Do not  have sex until treatment is completed or after your caregiver tells you it is okay.  Take warm sitz baths.  Do not douche.  Do not use tampons, especially scented ones.  Wear cotton underwear.  Avoid tight pants and panty hose.  Tell your sexual partner that you have a yeast infection. They should go to their caregiver if they have symptoms such as mild rash or itching.  Your sexual partner should be treated as well if your infection is difficult to eliminate.  Practice safer sex. Use condoms.  Some vaginal medications cause latex condoms to fail. Vaginal medications that harm condoms are:  Cleocin cream.  Butoconazole (Femstat).  Terconazole (Terazol) vaginal suppository.  Miconazole (Monistat) (may be purchased over the counter). SEEK MEDICAL CARE IF:   You have a temperature by mouth above 102 F (38.9 C).  The infection is getting worse after 2 days of treatment.  The infection is not getting better after 3 days of treatment.  You develop blisters in or around your vagina.  You develop vaginal bleeding, and it is not your menstrual period.  You have pain when you urinate.  You develop intestinal problems.  You have pain with sexual intercourse.  Your e-visit answers were reviewed by a board certified advanced clinical practitioner to complete your personal care plan.  Depending on the condition, your plan could have included both over the counter or prescription medications.  If there is a problem please reply  once you have received a response from your provider.  Your safety is important to [place].  If you have drug allergies check your prescription carefully.  You can use MyChart to ask questions about today's visit, request a non-urgent call back, or ask for a work or school excuse.  You will get an e-mail in the next two days asking about your experience.  I hope that your e-visit has been valuable and will speed your recovery. Thank you for using  e-visits.

## 2015-02-02 ENCOUNTER — Encounter: Payer: Self-pay | Admitting: Family Medicine

## 2015-02-02 MED ORDER — FLUTICASONE PROPIONATE 50 MCG/ACT NA SUSP: 2.0000 | Freq: Every day | NASAL | Status: DC

## 2015-02-02 NOTE — Telephone Encounter (Signed)
Med filled.  

## 2015-03-13 ENCOUNTER — Encounter: Payer: Self-pay | Admitting: Internal Medicine

## 2015-03-13 ENCOUNTER — Ambulatory Visit (INDEPENDENT_AMBULATORY_CARE_PROVIDER_SITE_OTHER): Payer: 59 | Admitting: Internal Medicine

## 2015-03-13 VITALS — BP 132/82 | HR 88 | Ht 64.0 in | Wt 239.2 lb

## 2015-03-13 DIAGNOSIS — K625 Hemorrhage of anus and rectum: Secondary | ICD-10-CM | POA: Diagnosis not present

## 2015-03-13 DIAGNOSIS — Z86010 Personal history of colonic polyps: Secondary | ICD-10-CM

## 2015-03-13 DIAGNOSIS — Z8719 Personal history of other diseases of the digestive system: Secondary | ICD-10-CM

## 2015-03-13 DIAGNOSIS — K219 Gastro-esophageal reflux disease without esophagitis: Secondary | ICD-10-CM

## 2015-03-13 DIAGNOSIS — M949 Disorder of cartilage, unspecified: Secondary | ICD-10-CM

## 2015-03-13 DIAGNOSIS — K602 Anal fissure, unspecified: Secondary | ICD-10-CM | POA: Diagnosis not present

## 2015-03-13 DIAGNOSIS — R0789 Other chest pain: Secondary | ICD-10-CM

## 2015-03-13 MED ORDER — AMBULATORY NON FORMULARY MEDICATION: Status: DC

## 2015-03-13 MED ORDER — NA SULFATE-K SULFATE-MG SULF 17.5-3.13-1.6 GM/177ML PO SOLN: ORAL | Status: DC

## 2015-03-13 NOTE — Progress Notes (Signed)
Patient ID: Stacey Alvarez, female   DOB: Jun 04, 1956, 59 y.o.   MRN: [id number] HPI: Stacey Alvarez is a 59 year old female with a past medical history of colon polyps, hemorrhoids and anal fissure, diverticulosis with clinical diagnosis of diverticulitis in April 2016, chronic GERD, hypertension, sleep apnea, fibromyalgia, anxiety and depression who is seen in consultation at the request of Dr. Birdie Riddle to evaluate anorectal pain, recent diverticulitis and history of colon polyps. She's here alone today. She reports that she developed severe left lower quadrant abdominal pain in April. This was associated with loose stools and also rectal bleeding. She was treated by Dr. Birdie Riddle with Cipro and Flagyl for 7 days and the abdominal pain resolved. She is still had loose stools occurring 1-2 times per day since her episode of diverticulitis. She still having brief, sharp knifelike pain with passing bowel movement. This is associated with blood on the toilet tissue not in the toilet or on the stool. This is in the "same spot" each time and she reports it is posterior. Pain is present with passing stool and short lived. She does report history of fissure and history of hemorrhoids. Difficult to know what is bothering her. She's had several prior colonoscopies performed in [facility]. She recalls polyps being removed each time. She feels she is 1 year overdue for surveillance in last colonoscopy was 4 years ago. She does report chronic GERD heartburn was previously treated with Dexilant but changed ranitidine by PCP over concern for chronic PPI use. She does have occasional breakthrough heartburn and indigestion but symptoms are currently level. She denies nausea, vomiting, early satiety, dysphagia and odynophagia. She recalls one prior upper endoscopy many years ago back in the 1990s. She has had a prior cholecystectomy also in the 90s. She reports a history of pancreatitis. She also reports chronic xiphoid pain  with palpation and movement. No alcohol use.. Denies a family history of liver disease, GI tract malignancy, and IBD  Lived in [place] for 30 years with her husband's employment. Now moved back here 18 months ago. Was born here.  Past Medical History  Diagnosis Date  . Hypertension   . Anxiety   . Sleep apnea   . Fibromyalgia   . IBS (irritable bowel syndrome)   . Depression     Past Surgical History  Procedure Laterality Date  . Partial hysterectomy  1999  . Cholecystectomy      Outpatient Prescriptions Prior to Visit  Medication Sig Dispense Refill  . ALPRAZolam (XANAX) 1 MG tablet 0.5 mg in the am and 2mg  at night    . amLODipine (NORVASC) 5 MG tablet TAKE 1 TABLET BY MOUTH EVERY NIGHT AT BEDTIME 90 tablet 1  . atorvastatin (LIPITOR) 20 MG tablet TAKE 1 TABLET BY MOUTH DAILY 90 tablet 1  . butalbital-aspirin-caffeine-codeine (FIORINAL WITH CODEINE) 50-325-40-30 MG capsule TAKE 1 CAPSULE BY MOUTH EVERY 6 HOURS AS NEEDED FOR PAIN 30 capsule 1  . cetirizine (ZYRTEC) 10 MG tablet Take 10 mg by mouth daily.    Marland Kitchen estrogens, conjugated, (PREMARIN) 0.625 MG tablet Take 1 tablet (0.625 mg total) by mouth daily. 90 tablet 1  . fluconazole (DIFLUCAN) 150 MG tablet Take 1 tablet (150 mg total) by mouth once. 1 tablet 0  . fluticasone (FLONASE) 50 MCG/ACT nasal spray Place 2 sprays into both nostrils daily. 16 g 6  . lamoTRIgine (LAMICTAL) 100 MG tablet Take 100 mg by mouth daily.    Marland Kitchen lisinopril (PRINIVIL,ZESTRIL) 20 MG tablet Take 20 mg by  mouth daily.    . promethazine (PHENERGAN) 25 MG tablet Take 1 tablet (25 mg total) by mouth every 8 (eight) hours as needed for nausea or vomiting. 45 tablet 0  . ranitidine (ZANTAC) 150 MG tablet Take 1 tablet (150 mg total) by mouth 2 (two) times daily. 180 tablet 3  . temazepam (RESTORIL) 30 MG capsule Take 30 mg by mouth at bedtime.    Marland Kitchen venlafaxine XR (EFFEXOR-XR) 75 MG 24 hr capsule Take 75 mg by mouth 3 (three) times daily.     . traMADol  (ULTRAM) 50 MG tablet      No facility-administered medications prior to visit.    Allergies  Allergen Reactions  . Sulfa Antibiotics Rash    Family History  Problem Relation Age of Onset  . Diabetes Mother   . Heart disease Mother   . Anxiety disorder Mother   . Depression Mother   . Diabetes Father   . Heart disease Father   . Anxiety disorder Father   . Depression Father   . Diabetes Paternal Grandmother   . Cystic fibrosis Son   . Alcohol abuse Son     History  Substance Use Topics  . Smoking status: Never Smoker   . Smokeless tobacco: Never Used  . Alcohol Use: Yes    ROS: As per history of present illness, otherwise negative  BP 132/82 mmHg  Pulse 88  Ht 5\' 4"  (1.626 m)  Wt 239 lb 3.2 oz (108.5 kg)  BMI 41.04 kg/m2 Constitutional: Well-developed and well-nourished. No distress. HEENT: Normocephalic and atraumatic. Oropharynx is clear and moist. No oropharyngeal exudate. Conjunctivae are normal.  No scleral icterus. Neck: Neck supple. Trachea midline. Cardiovascular: Normal rate, regular rhythm and intact distal pulses. No M/R/G Pulmonary/chest: Effort normal and breath sounds normal. No wheezing, rales or rhonchi. Abdominal: Soft, tenderness with palpation of xiphoid process but nontender in the epigastrium, nondistended. Obese. Bowel sounds active throughout. There are no masses palpable.  Extremities: no clubbing, cyanosis, or edema Lymphadenopathy: No cervical adenopathy noted. Neurological: Alert and oriented to person place and time. Skin: Skin is warm and dry. No rashes noted. Psychiatric: Normal mood and affect. Behavior is normal.  RELEVANT LABS AND IMAGING: CBC    Component Value Date/Time   WBC 11.5* 01/09/2015 1011   RBC 5.03 01/09/2015 1011   HGB 14.0 01/09/2015 1011   HCT 40.8 01/09/2015 1011   PLT 306.0 01/09/2015 1011   MCV 81.1 01/09/2015 1011   MCH 27.4 04/14/2014 0210   MCHC 34.2 01/09/2015 1011   RDW 14.0 01/09/2015 1011    LYMPHSABS 2.4 01/09/2015 1011   MONOABS 0.7 01/09/2015 1011   EOSABS 0.1 01/09/2015 1011   BASOSABS 0.0 01/09/2015 1011    CMP     Component Value Date/Time   NA 134* 01/09/2015 1011   K 4.3 01/09/2015 1011   CL 101 01/09/2015 1011   CO2 26 01/09/2015 1011   GLUCOSE 114* 01/09/2015 1011   BUN 17 01/09/2015 1011   CREATININE 0.59 01/09/2015 1011   CALCIUM 8.9 01/09/2015 1011   PROT 6.9 01/09/2015 1011   ALBUMIN 3.9 01/09/2015 1011   AST 9 01/09/2015 1011   ALT 8 01/09/2015 1011   ALKPHOS 83 01/09/2015 1011   BILITOT 0.4 01/09/2015 1011   GFRNONAA >90 04/14/2014 0210   GFRAA >90 04/14/2014 0210   Lab Results  Component Value Date   TSH 1.27 11/21/2014   Lab Results  Component Value Date   VITAMINB12 421 07/28/2014  ASSESSMENT/PLAN:  59 year old female with a past medical history of colon polyps, hemorrhoids and anal fissure, diverticulosis with clinical diagnosis of diverticulitis in April 2016, chronic GERD, hypertension, sleep apnea, fibromyalgia, anxiety and depression who is seen in consultation at the request of Dr. Birdie Riddle to evaluate anorectal pain, recent diverticulitis and history of colon polyps.   1. Hx of colon polyps -- records requested from gastroenterologist in [place]. Colonoscopy recommended for surveillance. We discussed the test including the risks and benefits and she is agreeable to proceed.  2. Anal pain with bleeding -- symptoms most consistent with anal fissure. We discussed the process overall. Nitroglycerin 0.125% 3 times a day times for 6 weeks applied to the anal canal. Colonoscopy will also further evaluate see #1  3. Chronic GERD -- symptoms adequately controlled with ranitidine 150 twice a day. Now off PPI. Upper endoscopy recommended given long-standing heartburn and reflux symptoms to exclude Barrett's esophagus. The test was discussed including the risks and benefits and she is agreeable to proceed.  4. Xiphoid pain -- with palpation.  Doesn't seem to relate to eating. EGD to rule out esophageal problem. Chronic issue not worsening.  5. History of diverticulitis -- resolved pain after antibiotics. Colonoscopy is discussed in #1 to exclude other process such as IBD.      KG:[id number] E Tabori, Md [place], [place] [address]

## 2015-03-13 NOTE — Patient Instructions (Signed)
You have been scheduled for an endoscopy and colonoscopy. Please follow the written instructions given to you at your visit today. Please pick up your prep supplies at the pharmacy within the next 1-3 days. If you use inhalers (even only as needed), please bring them with you on the day of your procedure. Your physician has requested that you go to www.startemmi.com and enter the access code given to you at your visit today. This web site gives a general overview about your procedure. However, you should still follow specific instructions given to you by our office regarding your preparation for the procedure.  We have sent medications to your pharmacy for you to pick up at your convenience: Nitroglycerin ointment sent to [facility].

## 2015-03-21 ENCOUNTER — Telehealth: Payer: Self-pay | Admitting: Internal Medicine

## 2015-03-21 NOTE — Telephone Encounter (Signed)
Pt states that she is having diarrhea after she eats. Pt wants to know what she can do. Discussed with pt that she can try taking Imodium for the diarrhea. Pt verbalized understanding.

## 2015-03-23 ENCOUNTER — Other Ambulatory Visit: Payer: Self-pay | Admitting: Family Medicine

## 2015-03-24 NOTE — Telephone Encounter (Signed)
Med filled.  

## 2015-04-04 ENCOUNTER — Other Ambulatory Visit: Payer: Self-pay | Admitting: Family Medicine

## 2015-04-05 NOTE — Telephone Encounter (Signed)
Med filled and faxed.  

## 2015-04-05 NOTE — Telephone Encounter (Signed)
last OV 01/09/15 Fiorinal last filled 12/14/14 #30 with 1  Low risk

## 2015-04-19 ENCOUNTER — Encounter: Payer: Self-pay | Admitting: Internal Medicine

## 2015-04-19 ENCOUNTER — Ambulatory Visit (AMBULATORY_SURGERY_CENTER): Payer: 59 | Admitting: Internal Medicine

## 2015-04-19 ENCOUNTER — Telehealth: Payer: Self-pay | Admitting: Internal Medicine

## 2015-04-19 VITALS — BP 138/78 | HR 81 | Temp 96.8°F | Resp 22 | Ht 64.0 in | Wt 239.0 lb

## 2015-04-19 DIAGNOSIS — Z86010 Personal history of colonic polyps: Secondary | ICD-10-CM

## 2015-04-19 DIAGNOSIS — K219 Gastro-esophageal reflux disease without esophagitis: Secondary | ICD-10-CM | POA: Diagnosis not present

## 2015-04-19 DIAGNOSIS — K2990 Gastroduodenitis, unspecified, without bleeding: Secondary | ICD-10-CM

## 2015-04-19 DIAGNOSIS — D123 Benign neoplasm of transverse colon: Secondary | ICD-10-CM

## 2015-04-19 DIAGNOSIS — K3189 Other diseases of stomach and duodenum: Secondary | ICD-10-CM | POA: Diagnosis not present

## 2015-04-19 DIAGNOSIS — K2970 Gastritis, unspecified, without bleeding: Secondary | ICD-10-CM

## 2015-04-19 MED ORDER — SODIUM CHLORIDE 0.9 % IV SOLN: 500.0000 mL | INTRAVENOUS | Status: DC

## 2015-04-19 MED ORDER — PRAMOXINE-HC 1-1 % EX CREA: TOPICAL_CREAM | Freq: Three times a day (TID) | CUTANEOUS | Status: DC

## 2015-04-19 NOTE — Patient Instructions (Signed)

## 2015-04-19 NOTE — Op Note (Signed)
[place]  [address]. [place], [address]   COLONOSCOPY PROCEDURE REPORT  PATIENT: Stacey Alvarez, Stacey Alvarez  MR#: [id number] BIRTHDATE: 20-Mar-1956 , 59  yrs. old GENDER: female ENDOSCOPIST: Jerene Bears, MD REFERRED ID:[id number] Wadie Lessen, M.D. PROCEDURE DATE:  04/19/2015 PROCEDURE:   Colonoscopy, surveillance and Colonoscopy with cold biopsy polypectomy First Screening Colonoscopy - Avg.  risk and is 50 yrs.  old or older - No.  Prior Negative Screening - Now for repeat screening. N/A  History of Adenoma - Now for follow-up colonoscopy & has been > or = to 3 yrs.  Yes hx of adenoma.  Has been 3 or more years since last colonoscopy.  Polyps removed today? Yes ASA CLASS:   Class II INDICATIONS:Surveillance due to prior colonic neoplasia and PH Colon Adenoma. MEDICATIONS: Monitored anesthesia care, Propofol 500 mg IV, and this was the total dose used for all procedures at this session  DESCRIPTION OF PROCEDURE:   After the risks benefits and alternatives of the procedure were thoroughly explained, informed consent was obtained.  The digital rectal exam revealed external hemorrhoids.   The LB [id number]  endoscope was introduced through the anus and advanced to the cecum, which was identified by both the appendix and ileocecal valve. No adverse events experienced.   The quality of the prep was good.  (Suprep was used) The instrument was then slowly withdrawn as the colon was fully examined. Estimated blood loss is zero unless otherwise noted in this procedure report.  COLON FINDINGS: Two sessile polyps ranging between 3-41mm in size were found in the transverse colon.  Polypectomies were performed with cold forceps.  The resection was complete, the polyp tissue was completely retrieved and sent to histology.   There was mild diverticulosis noted in the descending colon and sigmoid colon. Retroflexed views revealed external hemorrhoids and skin tag. The time to  cecum = 4.3 Withdrawal time = 8.6   The scope was withdrawn and the procedure completed. COMPLICATIONS: There were no immediate complications.  ENDOSCOPIC IMPRESSION: 1.   Two sessile polyps ranging between 3-78mm in size were found in the transverse colon; polypectomies were performed with cold forceps 2.   Mild diverticulosis was noted in the descending colon and sigmoid colon  RECOMMENDATIONS: 1.  Await pathology results 2.  High fiber diet 3.  Repeat Colonoscopy in 5 years. 4.  You will receive a letter within 1-2 weeks with the results of your biopsy as well as final recommendations.  Please call my office if you have not received a letter after 3 weeks. 5.  Trial of Analpram 2-3 times daily for external hemorrhoids  eSigned:  Jerene Bears, MD 04/19/2015 4:06 PM  cc: Midge Minium, MD and The Patient

## 2015-04-19 NOTE — Progress Notes (Signed)
Transferred to recovery room. A/O x3, pleased with MAC.  VSS.  Report to Jane, RN. 

## 2015-04-19 NOTE — Telephone Encounter (Signed)
Pt called in and said she was very nauseated from the prep and wanted to know if she could take Phenergan. Spoke w/Barbara Smith in [place] and she confirmed with Joe the CRNA that the patient could take her Phenergan immediately and nothing to drink until after her procedure @ 3:30 today

## 2015-04-19 NOTE — Op Note (Signed)
[place]  [address]. [place], [address]   ENDOSCOPY PROCEDURE REPORT  PATIENT: Stacey Alvarez, Stacey Alvarez  MR#: [id number] BIRTHDATE: [birth year] , 59  yrs. old GENDER: female ENDOSCOPIST: Jerene Bears, MD REFERRED BY:  Midge Minium, M.D. PROCEDURE DATE:  04/19/2015 PROCEDURE:  EGD, diagnostic and EGD w/ biopsy ASA CLASS:     Class II INDICATIONS:  history of GERD. MEDICATIONS: Monitored anesthesia care and Propofol 200 mg IV TOPICAL ANESTHETIC: none  DESCRIPTION OF PROCEDURE: After the risks benefits and alternatives of the procedure were thoroughly explained, informed consent was obtained.  The LB [id number] endoscope was introduced through the mouth and advanced to the second portion of the duodenum , Without limitations.  The instrument was slowly withdrawn as the mucosa was fully examined.   ESOPHAGUS: The mucosa of the esophagus appeared normal.  No evidence of Barrett's esophagus  STOMACH: Congested gastropathy was found in the gastric antrum. Cold forcep biopsies were taken at the gastric body, antrum and angularis to evaluate for h.  pylori.  DUODENUM: The duodenal mucosa showed no abnormalities in the bulb and 2nd part of the duodenum.  Retroflexed views revealed no abnormalities.     The scope was then withdrawn from the patient and the procedure completed.  COMPLICATIONS: There were no immediate complications.  ENDOSCOPIC IMPRESSION: 1.   The mucosa of the esophagus appeared normal 2.   Gastropathy was found in the gastric antrum; multiple biopsies 3.   The duodenal mucosa showed no abnormalities in the bulb and 2nd part of the duodenum  RECOMMENDATIONS: 1.  Await biopsy results 2.  Continue current medications 3.  Proceed with a Colonoscopy.  eSigned:  Jerene Bears, MD 04/19/2015 4:04 PM    FI:[id number] Wadie Lessen, MD and The Patient

## 2015-04-19 NOTE — Progress Notes (Signed)
Called to room to assist during endoscopic procedure.  Patient ID and intended procedure confirmed with present staff. Received instructions for my participation in the procedure from the performing physician.  

## 2015-04-20 ENCOUNTER — Telehealth: Payer: Self-pay | Admitting: Emergency Medicine

## 2015-04-20 NOTE — Telephone Encounter (Signed)
  Follow up Call-  Call back number 04/19/2015  Post procedure Call Back phone  # [phone]  Permission to leave phone message Yes     Patient questions:  Do you have a fever, pain , or abdominal swelling? No. Pain Score  0 *  Have you tolerated food without any problems? Yes.    Have you been able to return to your normal activities? Yes.    Do you have any questions about your discharge instructions: Diet   No. Medications  No. Follow up visit  No.  Do you have questions or concerns about your Care? No.  Actions: * If pain score is 4 or above: No action needed, pain <4.

## 2015-05-01 ENCOUNTER — Encounter: Payer: Self-pay | Admitting: Family Medicine

## 2015-05-01 MED ORDER — LISINOPRIL 20 MG PO TABS: 20.0000 mg | ORAL_TABLET | Freq: Every day | ORAL | Status: DC

## 2015-05-01 NOTE — Telephone Encounter (Signed)
Medication filled to pharmacy as requested.   

## 2015-05-02 ENCOUNTER — Encounter: Payer: Self-pay | Admitting: Internal Medicine

## 2015-05-10 ENCOUNTER — Encounter: Payer: Self-pay | Admitting: Family Medicine

## 2015-05-10 MED ORDER — CIPROFLOXACIN HCL 500 MG PO TABS: 500.0000 mg | ORAL_TABLET | Freq: Two times a day (BID) | ORAL | Status: DC

## 2015-05-10 NOTE — Telephone Encounter (Signed)
Medication filled to pharmacy as requested.   

## 2015-05-24 ENCOUNTER — Ambulatory Visit (INDEPENDENT_AMBULATORY_CARE_PROVIDER_SITE_OTHER): Payer: 59 | Admitting: Family Medicine

## 2015-05-24 ENCOUNTER — Encounter: Payer: Self-pay | Admitting: Family Medicine

## 2015-05-24 VITALS — BP 130/76 | HR 106 | Temp 98.2°F | Resp 16 | Ht 64.0 in | Wt 240.4 lb

## 2015-05-24 DIAGNOSIS — I10 Essential (primary) hypertension: Secondary | ICD-10-CM

## 2015-05-24 DIAGNOSIS — N760 Acute vaginitis: Secondary | ICD-10-CM | POA: Diagnosis not present

## 2015-05-24 DIAGNOSIS — E785 Hyperlipidemia, unspecified: Secondary | ICD-10-CM

## 2015-05-24 DIAGNOSIS — R300 Dysuria: Secondary | ICD-10-CM | POA: Diagnosis not present

## 2015-05-24 DIAGNOSIS — R319 Hematuria, unspecified: Secondary | ICD-10-CM

## 2015-05-24 DIAGNOSIS — N3000 Acute cystitis without hematuria: Secondary | ICD-10-CM | POA: Diagnosis not present

## 2015-05-24 LAB — BASIC METABOLIC PANEL
BUN: 16 mg/dL (ref 6–23)
CALCIUM: 9 mg/dL (ref 8.4–10.5)
CO2: 27 mEq/L (ref 19–32)
Chloride: 103 mEq/L (ref 96–112)
Creatinine, Ser: 0.72 mg/dL (ref 0.40–1.20)
GFR: 87.37 mL/min (ref >60.00)
GLUCOSE: 132 mg/dL — AB (ref 70–99)
Potassium: 3.7 mEq/L (ref 3.5–5.1)
SODIUM: 140 meq/L (ref 135–145)

## 2015-05-24 LAB — CBC WITH DIFFERENTIAL/PLATELET
Basophils Absolute: 0 10*3/uL (ref 0.0–0.1)
Basophils Relative: 0.4 % (ref 0.0–3.0)
EOS PCT: 1.7 % (ref 0.0–5.0)
Eosinophils Absolute: 0.1 10*3/uL (ref 0.0–0.7)
HCT: 39.5 % (ref 36.0–46.0)
HEMOGLOBIN: 13.3 g/dL (ref 12.0–15.0)
LYMPHS ABS: 1.5 10*3/uL (ref 0.7–4.0)
Lymphocytes Relative: 20.8 % (ref 12.0–46.0)
MCHC: 33.5 g/dL (ref 30.0–36.0)
MCV: 83.4 fl (ref 78.0–100.0)
MONOS PCT: 4.6 % (ref 3.0–12.0)
Monocytes Absolute: 0.3 10*3/uL (ref 0.1–1.0)
NEUTROS PCT: 72.5 % (ref 43.0–77.0)
Neutro Abs: 5.3 10*3/uL (ref 1.4–7.7)
Platelets: 256 10*3/uL (ref 150.0–400.0)
RBC: 4.74 Mil/uL (ref 3.87–5.11)
RDW: 13.5 % (ref 11.5–15.5)
WBC: 7.3 10*3/uL (ref 4.0–10.5)

## 2015-05-24 LAB — POCT URINALYSIS DIPSTICK
BILIRUBIN UA: NEGATIVE
Blood, UA: 10
GLUCOSE UA: NEGATIVE
KETONES UA: NEGATIVE
LEUKOCYTES UA: NEGATIVE
Nitrite, UA: NEGATIVE
Spec Grav, UA: >=1.03
Urobilinogen, UA: 0.2
pH, UA: 5.5

## 2015-05-24 LAB — HEPATIC FUNCTION PANEL
ALBUMIN: 4.1 g/dL (ref 3.5–5.2)
ALK PHOS: 77 U/L (ref 39–117)
ALT: 7 U/L (ref 0–35)
AST: 10 U/L (ref 0–37)
Bilirubin, Direct: 0 mg/dL (ref 0.0–0.3)
Total Bilirubin: 0.2 mg/dL (ref 0.2–1.2)
Total Protein: 7 g/dL (ref 6.0–8.3)

## 2015-05-24 LAB — LIPID PANEL
Cholesterol: 232 mg/dL — ABNORMAL HIGH (ref 0–200)
HDL: 53.3 mg/dL (ref >39.00)
NONHDL: 178.89
Total CHOL/HDL Ratio: 4
Triglycerides: 377 mg/dL — ABNORMAL HIGH (ref 0.0–149.0)
VLDL: 75.4 mg/dL — ABNORMAL HIGH (ref 0.0–40.0)

## 2015-05-24 LAB — LDL CHOLESTEROL, DIRECT: Direct LDL: 126 mg/dL

## 2015-05-24 MED ORDER — CEPHALEXIN 500 MG PO CAPS
500.0000 mg | ORAL_CAPSULE | Freq: Two times a day (BID) | ORAL | Status: AC
Start: 2015-05-24 — End: 2015-06-03

## 2015-05-24 MED ORDER — BUTALBITAL-ASA-CAFF-CODEINE 50-325-40-30 MG PO CAPS: ORAL_CAPSULE | ORAL | Status: DC

## 2015-05-24 MED ORDER — FLUCONAZOLE 150 MG PO TABS: 150.0000 mg | ORAL_TABLET | Freq: Once | ORAL | Status: DC

## 2015-05-24 MED ORDER — PROMETHAZINE HCL 25 MG PO TABS: ORAL_TABLET | ORAL | Status: DC

## 2015-05-24 NOTE — Patient Instructions (Addendum)
Schedule your complete physical in 6 months We'll notify you of your lab results and make any changes if needed Start the Keflex twice daily for the presumed UTI Drink plenty of fluids Continue to work on healthy diet and regular exercise- you can do this! Call with any questions or concerns Have a great fall season!!!

## 2015-05-24 NOTE — Progress Notes (Signed)
   Subjective:    Patient ID: Stacey Alvarez, female    DOB: 1956/02/23, 59 y.o.   MRN: [id number]  HPI HTN- chronic problem, on Lisinopril, Amlodipine.  Good control.  Has gained weight on Effexor.  No CP, SOB, HAs, visual changes, edema.  Walking regularly.  Hyperlipidemia- chronic problem.  No longer taking Lipitor as directed.  No abd pain, N/V.  UTI- pt was tx'd w/ Cipro while out of town on 8/24 for presumed UTI.  Pt reports sxs improved but did not resolve.  Admits to poor water intake.  + hesitancy, incomplete emptying, nocturia.   Review of Systems For ROS see HPI     Objective:   Physical Exam  Constitutional: She is oriented to person, place, and time. She appears well-developed and well-nourished. No distress.  HENT:  Head: Normocephalic and atraumatic.  Eyes: Conjunctivae and EOM are normal. Pupils are equal, round, and reactive to light.  Neck: Normal range of motion. Neck supple. No thyromegaly present.  Cardiovascular: Normal rate, regular rhythm, normal heart sounds and intact distal pulses.   No murmur heard. Pulmonary/Chest: Effort normal and breath sounds normal. No respiratory distress.  Abdominal: Soft. She exhibits no distension. There is no tenderness.  Musculoskeletal: She exhibits no edema.  Lymphadenopathy:    She has no cervical adenopathy.  Neurological: She is alert and oriented to person, place, and time.  Skin: Skin is warm and dry.  Psychiatric: She has a normal mood and affect. Her behavior is normal.  Vitals reviewed.         Assessment & Plan:

## 2015-05-24 NOTE — Progress Notes (Signed)
Pre visit review using our clinic review tool, if applicable. No additional management support is needed unless otherwise documented below in the visit note. 

## 2015-05-25 ENCOUNTER — Other Ambulatory Visit: Payer: Self-pay | Admitting: General Practice

## 2015-05-25 ENCOUNTER — Encounter: Payer: Self-pay | Admitting: Family Medicine

## 2015-05-25 ENCOUNTER — Other Ambulatory Visit (INDEPENDENT_AMBULATORY_CARE_PROVIDER_SITE_OTHER): Payer: 59

## 2015-05-25 DIAGNOSIS — R739 Hyperglycemia, unspecified: Secondary | ICD-10-CM | POA: Diagnosis not present

## 2015-05-25 LAB — URINE CULTURE

## 2015-05-25 LAB — HEMOGLOBIN A1C: HEMOGLOBIN A1C: 5.2 % (ref 4.6–6.5)

## 2015-05-25 MED ORDER — FENOFIBRATE 160 MG PO TABS: 160.0000 mg | ORAL_TABLET | Freq: Every day | ORAL | Status: DC

## 2015-05-27 NOTE — Assessment & Plan Note (Signed)
Recurrent issue for pt.  Pt's sxs and UA consistent w/ infxn.  Start abx while awaiting cx results.

## 2015-05-27 NOTE — Assessment & Plan Note (Signed)
Chronic problem.  Tolerating meds w/o difficulty.  Asymptomatic.  Check labs.  No anticipated med changes.

## 2015-05-27 NOTE — Assessment & Plan Note (Signed)
Chronic problem.  Not currently on statin.  On Fenofibrate.  Stressed need for healthy diet and regular exercise.  Check labs.  Adjust tx plan prn.

## 2015-06-18 ENCOUNTER — Encounter: Payer: Self-pay | Admitting: Family Medicine

## 2015-06-19 ENCOUNTER — Other Ambulatory Visit: Payer: Self-pay

## 2015-06-19 MED ORDER — LISINOPRIL 20 MG PO TABS: 20.0000 mg | ORAL_TABLET | Freq: Two times a day (BID) | ORAL | Status: DC

## 2015-06-20 ENCOUNTER — Encounter: Payer: Self-pay | Admitting: Family Medicine

## 2015-06-20 ENCOUNTER — Other Ambulatory Visit: Payer: Self-pay | Admitting: Family Medicine

## 2015-06-20 MED ORDER — SUMATRIPTAN SUCCINATE 50 MG PO TABS: 50.0000 mg | ORAL_TABLET | Freq: Once | ORAL | Status: DC

## 2015-06-21 NOTE — Telephone Encounter (Signed)
Medication filled to pharmacy as requested.   

## 2015-06-24 ENCOUNTER — Other Ambulatory Visit: Payer: Self-pay | Admitting: Family Medicine

## 2015-06-26 NOTE — Telephone Encounter (Signed)
Refill sent per [facility] refill protocol/SLS  

## 2015-07-16 ENCOUNTER — Encounter: Payer: Self-pay | Admitting: Family Medicine

## 2015-07-17 MED ORDER — BUTALBITAL-ASA-CAFF-CODEINE 50-325-40-30 MG PO CAPS: ORAL_CAPSULE | ORAL | Status: DC

## 2015-07-17 NOTE — Telephone Encounter (Signed)
Medication filled to pharmacy as requested.   

## 2015-07-17 NOTE — Telephone Encounter (Signed)
Last OV 05/24/15 Fiorinal last filled 05/24/15 #30 with 0

## 2015-07-24 ENCOUNTER — Encounter: Payer: Self-pay | Admitting: Family Medicine

## 2015-08-23 ENCOUNTER — Encounter: Payer: Self-pay | Admitting: Family Medicine

## 2015-08-23 MED ORDER — BUTALBITAL-ASA-CAFF-CODEINE 50-325-40-30 MG PO CAPS: ORAL_CAPSULE | ORAL | Status: DC

## 2015-08-23 NOTE — Telephone Encounter (Signed)
Medication filled to pharmacy as requested.   

## 2015-08-23 NOTE — Telephone Encounter (Signed)
Last OV 05/24/15 fiorinal last filled 07/17/15 #30 with 0

## 2015-08-24 ENCOUNTER — Encounter: Payer: Self-pay | Admitting: Family Medicine

## 2015-08-24 ENCOUNTER — Ambulatory Visit (INDEPENDENT_AMBULATORY_CARE_PROVIDER_SITE_OTHER): Payer: 59 | Admitting: Family Medicine

## 2015-08-24 VITALS — BP 134/82 | HR 103 | Temp 98.1°F | Resp 16 | Ht 64.0 in | Wt 239.1 lb

## 2015-08-24 DIAGNOSIS — K5792 Diverticulitis of intestine, part unspecified, without perforation or abscess without bleeding: Secondary | ICD-10-CM | POA: Diagnosis not present

## 2015-08-24 MED ORDER — CIPROFLOXACIN HCL 500 MG PO TABS: 500.0000 mg | ORAL_TABLET | Freq: Two times a day (BID) | ORAL | Status: DC

## 2015-08-24 MED ORDER — METRONIDAZOLE 500 MG PO TABS: 500.0000 mg | ORAL_TABLET | Freq: Three times a day (TID) | ORAL | Status: DC

## 2015-08-24 MED ORDER — PROMETHAZINE HCL 25 MG PO TABS: ORAL_TABLET | ORAL | Status: DC

## 2015-08-24 NOTE — Progress Notes (Signed)
Pre visit review using our clinic review tool, if applicable. No additional management support is needed unless otherwise documented below in the visit note. 

## 2015-08-24 NOTE — Assessment & Plan Note (Signed)
Recurrent issue for pt.  sxs and PE consistent w/ infxn.  Start abx.  Reviewed supportive care and red flags that should prompt return.  Pt expressed understanding and is in agreement w/ plan.  

## 2015-08-24 NOTE — Patient Instructions (Signed)
Follow up as needed Start the Cipro and Flagyl as directed Clear liquid diet- jello, applesauce, broth Drink plenty of fluids Phenergan as needed REST! Call with any questions or concerns If you want to join [place] at the new [place] office, any scheduled appointments will automatically transfer and we will see you at [address] [place] [address] [place], [place] [address] (OPENING 09/19/15) Happy Holidays!!!

## 2015-08-24 NOTE — Progress Notes (Signed)
   Subjective:    Patient ID: Stacey Alvarez, female    DOB: 1956/01/01, 59 y.o.   MRN: [id number]  HPI LLQ pain- pt has hx of diverticulitis.  Developed LLQ pain after eating popcorn 3 nights ago.  Pain woke her from sleep last night.  + nausea, gas and bloating.  No blood in stool.  Very TTP.  No fevers but some sweats and chills.  + diarrhea.  No known sick contacts.   Review of Systems For ROS see HPI     Objective:   Physical Exam  Constitutional: She appears well-developed and well-nourished. No distress.  HENT:  Head: Normocephalic and atraumatic.  Eyes: Conjunctivae and EOM are normal. Pupils are equal, round, and reactive to light.  Abdominal: Soft. Bowel sounds are normal. There is tenderness (very TTP over LLQ). There is guarding (voluntary guarding over LLQ). There is no rebound.  Neurological: She is alert.  Skin: Skin is warm and dry.  Psychiatric: She has a normal mood and affect. Her behavior is normal. Thought content normal.  Vitals reviewed.         Assessment & Plan:

## 2015-08-25 ENCOUNTER — Encounter: Payer: Self-pay | Admitting: Family Medicine

## 2015-09-01 ENCOUNTER — Encounter: Payer: Self-pay | Admitting: Family Medicine

## 2015-09-01 ENCOUNTER — Other Ambulatory Visit: Payer: Self-pay | Admitting: General Practice

## 2015-09-01 MED ORDER — FLUCONAZOLE 150 MG PO TABS: 150.0000 mg | ORAL_TABLET | Freq: Once | ORAL | Status: DC

## 2015-09-04 ENCOUNTER — Telehealth: Payer: Self-pay | Admitting: Internal Medicine

## 2015-09-04 NOTE — Telephone Encounter (Signed)
Pt states the skin tag that Dr. Hilarie Fredrickson found has gotten hard and she wants to know who she should see about this. States that the cream he gave her for her hemorrhoids is not helping, reports she is still having pain and some bleeding from the hemorrhoids. Please advise.

## 2015-09-04 NOTE — Telephone Encounter (Signed)
At the time of her procedure her hemorrhoids were external without internal component This is a surgical issue for correction given no response to topical medicines Would refer to [facility] either Dr. Johney Maine or Marcello Moores

## 2015-09-04 NOTE — Telephone Encounter (Signed)
Pt scheduled to see Dr. Johney Maine tomorrow at 3:30pm, pt aware of appt.

## 2015-09-05 ENCOUNTER — Other Ambulatory Visit: Payer: Self-pay | Admitting: Surgery

## 2015-09-05 NOTE — H&P (Signed)
Stacey Alvarez 09/05/2015 4:16 PM Location: [facility] Patient #: [id number] DOB: 1956-02-23 Married / Language: English / Race: White Female  History of Present Illness Adin Hector MD; 09/05/2015 5:15 PM) The patient is a 59 year old female who presents with hemorrhoids. Note for "Hemorrhoids": Patient sent by her gastroenterologist Dr. Kathe Becton with gastroenterology and [place]. Concern for symptomatic hemorrhoids.  Pleasant obese female with occasional loose stools. History of diverticulosis. Has had one or 2 attacks of diverticulitis. She is currently on antibiotics. Patient notes she's had hemorrhoid problems for many years. Always feels like some tags around. Hard to keep the area clean. Bleeding and irritation. Thought she had episode severe pain. Concern for possible anal fissure. Trial of nitroglycerin cream given without much help. Had colonoscopy done in the summertime that of diverticulosis and some hemorrhoids. Because of persistent symptoms, surgical consultation requested.  Patient notes that often just gets itching and irritation with blood when she wipes. Concerned her. Was hoping that they could just be removed in the office today. She is used up her deductible & was hoping something simple can be done before the end of the year.   Allergies Elbert Ewings, [place]; 09/05/2015 4:17 PM) No Known Drug Allergies 09/05/2015  Medication History Elbert Ewings, CMA; 09/05/2015 4:21 PM) Lisinopril (20MG  Tablet, Oral) Active. LamoTRIgine (100MG  Tablet, Oral) Active. Norvasc (5MG  Tablet, Oral) Active. Temazepam (30MG  Capsule, Oral) Active. ALPRAZolam (1MG  Tablet, Oral) Active. Premarin (0.625MG  Tablet, Oral) Active. Flonase (50MCG/ACT Suspension, Nasal) Active. Effexor (100MG  Tablet, Oral) Active. ZyrTEC (10MG  Tablet, Oral) Active. RaNITidine HCl (150MG  Capsule, Oral) Active. Medications Reconciled     Review of Systems Elbert Ewings CMA;  09/05/2015 4:16 PM) General Not Present- Appetite Loss, Chills, Fatigue, Fever, Night Sweats, Weight Gain and Weight Loss. Skin Present- New Lesions. Not Present- Change in Wart/Mole, Dryness, Hives, Jaundice, Non-Healing Wounds, Rash and Ulcer. HEENT Present- Wears glasses/contact lenses. Not Present- Earache, Hearing Loss, Hoarseness, Nose Bleed, Oral Ulcers, Ringing in the Ears, Seasonal Allergies, Sinus Pain, Sore Throat, Visual Disturbances and Yellow Eyes. Respiratory Present- Snoring. Not Present- Bloody sputum, Chronic Cough, Difficulty Breathing and Wheezing. Breast Not Present- Breast Mass, Breast Pain, Nipple Discharge and Skin Changes. Cardiovascular Not Present- Chest Pain, Difficulty Breathing Lying Down, Leg Cramps, Palpitations, Rapid Heart Rate, Shortness of Breath and Swelling of Extremities. Gastrointestinal Present- Chronic diarrhea, Hemorrhoids and Rectal Pain. Not Present- Abdominal Pain, Bloating, Bloody Stool, Change in Bowel Habits, Constipation, Difficulty Swallowing, Excessive gas, Gets full quickly at meals, Indigestion, Nausea and Vomiting. Musculoskeletal Present- Joint Pain and Muscle Pain. Not Present- Back Pain, Joint Stiffness, Muscle Weakness and Swelling of Extremities. Neurological Not Present- Decreased Memory, Fainting, Headaches, Numbness, Seizures, Tingling, Tremor, Trouble walking and Weakness. Psychiatric Present- Anxiety, Depression and Frequent crying. Not Present- Bipolar, Change in Sleep Pattern and Fearful. Endocrine Not Present- Cold Intolerance, Excessive Hunger, Hair Changes, Heat Intolerance, Hot flashes and New Diabetes. Hematology Not Present- Easy Bruising, Excessive bleeding, Gland problems, HIV and Persistent Infections.  Vitals Elbert Ewings CMA; 09/05/2015 4:21 PM) 09/05/2015 4:21 PM Weight: 238 lb Height: 64in Body Surface Area: 2.11 m Body Mass Index: 40.85 kg/m  Temp.: 97.38F(Temporal)  Pulse: 113 (Regular)  BP: 132/74  (Sitting, Left Arm, Standard)      Physical Exam Adin Hector MD; 09/05/2015 4:41 PM)  General Mental Status-Alert. General Appearance-Not in acute distress, Not Sickly. Orientation-Oriented X3. Hydration-Well hydrated. Voice-Normal.  Integumentary Global Assessment Upon inspection and palpation of skin surfaces of the - Axillae: non-tender, no inflammation  or ulceration, no drainage. and Distribution of scalp and body hair is normal. General Characteristics Temperature - normal warmth is noted.  Head and Neck Head-normocephalic, atraumatic with no lesions or palpable masses. Face Global Assessment - atraumatic, no absence of expression. Neck Global Assessment - no abnormal movements, no bruit auscultated on the right, no bruit auscultated on the left, no decreased range of motion, non-tender. Trachea-midline. Thyroid Gland Characteristics - non-tender.  Eye Eyeball - Left-Extraocular movements intact, No Nystagmus. Eyeball - Right-Extraocular movements intact, No Nystagmus. Cornea - Left-No Hazy. Cornea - Right-No Hazy. Sclera/Conjunctiva - Left-No scleral icterus, No Discharge. Sclera/Conjunctiva - Right-No scleral icterus, No Discharge. Pupil - Left-Direct reaction to light normal. Pupil - Right-Direct reaction to light normal.  ENMT Ears Pinna - Left - no drainage observed, no generalized tenderness observed. Right - no drainage observed, no generalized tenderness observed. Nose and Sinuses External Inspection of the Nose - no destructive lesion observed. Inspection of the nares - Left - quiet respiration. Right - quiet respiration. Mouth and Throat Lips - Upper Lip - no fissures observed, no pallor noted. Lower Lip - no fissures observed, no pallor noted. Nasopharynx - no discharge present. Oral Cavity/Oropharynx - Tongue - no dryness observed. Oral Mucosa - no cyanosis observed. Hypopharynx - no evidence of airway distress  observed.  Chest and Lung Exam Inspection Movements - Normal and Symmetrical. Accessory muscles - No use of accessory muscles in breathing. Palpation Palpation of the chest reveals - Non-tender. Auscultation Breath sounds - Normal and Clear.  Cardiovascular Auscultation Rhythm - Regular. Murmurs & Other Heart Sounds - Auscultation of the heart reveals - No Murmurs and No Systolic Clicks.  Abdomen Inspection Inspection of the abdomen reveals - No Visible peristalsis and No Abnormal pulsations. Umbilicus - No Bleeding, No Urine drainage. Palpation/Percussion Palpation and Percussion of the abdomen reveal - Soft, Non Tender, No Rebound tenderness, No Rigidity (guarding) and No Cutaneous hyperesthesia. Note: Obese but soft. Well-healed laparoscopic incisions case. No hernia. No guarding. Minimal discomfort in LEFT lower quadrant on deep pressure only.  Female Genitourinary Sexual Maturity Tanner 5 - Adult hair pattern. Note: No vaginal bleeding nor discharge  Rectal Note: Exam done with assistance of female Medical Assistant in the room. Perianal skin clean with good hygiene. No pruritis ani. No pilonidal disease. No fissure. No abscess/fistula. Normal sphincter tone. Tolerates digital and anoscopic rectal exam. No rectal masses.  External hemorrhoids: RIGHT posterior greater than RIGHT anterior internal/external hemorrhoids Grade 3. LEFT side without any external hemorrhoids. Internal pile grade 2. Mild thickening in LEFT anterolateral anal canal but no fissure or stricturing. No posterior midline scarring or stricture or fissure.  Peripheral Vascular Upper Extremity Inspection - Left - No Cyanotic nailbeds, Not Ischemic. Right - No Cyanotic nailbeds, Not Ischemic.  Neurologic Neurologic evaluation reveals -normal attention span and ability to concentrate, able to name objects and repeat phrases. Appropriate fund of knowledge , normal sensation and normal coordination. Mental  Status Affect - not angry, not paranoid. Cranial Nerves-Normal Bilaterally. Gait-Normal.  Neuropsychiatric Mental status exam performed with findings of-able to articulate well with normal speech/language, rate, volume and coherence, thought content normal with ability to perform basic computations and apply abstract reasoning and no evidence of hallucinations, delusions, obsessions or homicidal/suicidal ideation.  Musculoskeletal Global Assessment Spine, Ribs and Pelvis - no instability, subluxation or laxity. Right Upper Extremity - no instability, subluxation or laxity.  Lymphatic Head & Neck  General Head & Neck Lymphatics: Bilateral - Description - No Localized lymphadenopathy.  Axillary  General Axillary Region: Bilateral - Description - No Localized lymphadenopathy. Femoral & Inguinal  Generalized Femoral & Inguinal Lymphatics: Left - Description - No Localized lymphadenopathy. Right - Description - No Localized lymphadenopathy.   Results Adin Hector MD; 09/05/2015 5:17 PM) Procedures  Name Value Date Hemorrhoids Procedure Anal exam: External Hemorrhoid Skin tag Internal exam: Internal Hemorroids ( non-bleeding) Other: Please see physical exam section under rectal. RIGHT posterior>anterior grade 3 internal/external hemorrhoid. LEFT lateral side Grade 2. No other major abnormalities.  Performed: 09/05/2015 4:42 PM    Assessment & Plan Adin Hector MD; 09/05/2015 5:17 PM)  PROLAPSED INTERNAL HEMORRHOIDS, GRADE 3 (K64.2) Impression: Significant hemorrhoids. I think if they are bothering her, they can be removed. This required outpatient surgery. 2 significant to try and do in the office. This would provide chance for internal hemorrhoidal ligation if needed. I don't think venous really have to do a THD type ligation.  She seemed disappointed that I cannot do it right now. Sounds like she may wait for a while. Her main concern was bleeding.  It is not severe. Given the normal colonoscopy, reasonable hold off until symptoms are unbearable.  EXTERNAL HEMORRHOIDS WITH COMPLICATION (0000000) Impression: Itching irritation and occasional bleeding. I can remove them but would recommend outpatient surgery. 2 significant to do in the office. I do not think she would tolerate that well anyway. She wants to think about things and consider it. I suspect she may wait until later next year  Current Plans ANOSCOPY, DIAGNOSTIC [id number]) Pt Education - [facility] Hemorrhoids ([place]): discussed with patient and provided information. Pt Education - Pamphlet Given - The Hemorrhoid Book: discussed with patient and provided information. You are being scheduled for surgery - Our schedulers will call you.  You should hear from our office's scheduling department within 5 working days about the location, date, and time of surgery. We try to make accommodations for patient's preferences in scheduling surgery, but sometimes the OR schedule or the surgeon's schedule prevents [place] from making those accommodations.  If you have not heard from our office [phone]) in 5 working days, call the office and ask for your surgeon's nurse.  If you have other questions about your diagnosis, plan, or surgery, call the office and ask for your surgeon's nurse.  The anatomy & physiology of the anorectal region was discussed. The pathophysiology of hemorrhoids and differential diagnosis was discussed. Natural history risks without surgery was discussed. I stressed the importance of a bowel regimen to have daily soft bowel movements to minimize progression of disease. Interventions such as sclerotherapy & banding were discussed.  The patient's symptoms are not adequately controlled by medicines and other non-operative treatments. I feel the risks & problems of no surgery outweigh the operative risks; therefore, I recommended surgery to treat the hemorrhoids by ligation, pexy, and  possible resection.  Risks such as bleeding, infection, urinary difficulties, need for further treatment, heart attack, death, and other risks were discussed. I noted a good likelihood this will help address the problem. Goals of post-operative recovery were discussed as well. Possibility that this will not correct all symptoms was explained. Post-operative pain, bleeding, constipation, and other problems after surgery were discussed. We will work to minimize complications. Educational handouts further explaining the pathology, treatment options, and bowel regimen were given as well. Questions were answered. The patient expresses understanding & wishes to proceed with surgery.  Started Anusol-HC 2.5%, 1 (one) Cream twice daily, as needed, 1 Tube, 09/05/2015, Ref. X2.  Marland Kitchen  Adin Hector, M.D., F.A.C.S. Gastrointestinal and Minimally Invasive Surgery [facility] [address], [place],  [id number] [phone] Main / Paging

## 2015-09-15 ENCOUNTER — Encounter: Payer: Self-pay | Admitting: Family Medicine

## 2015-09-15 MED ORDER — BUTALBITAL-ASA-CAFF-CODEINE 50-325-40-30 MG PO CAPS: ORAL_CAPSULE | ORAL | Status: DC

## 2015-09-15 NOTE — Telephone Encounter (Signed)
Medication filled to pharmacy as requested.   

## 2015-09-24 ENCOUNTER — Other Ambulatory Visit: Payer: Self-pay | Admitting: Family Medicine

## 2015-09-26 NOTE — Telephone Encounter (Signed)
Rx's sent to the pharmacy by e-script.//AB/CMA 

## 2015-10-21 ENCOUNTER — Other Ambulatory Visit: Payer: Self-pay | Admitting: Family Medicine

## 2015-10-23 NOTE — Telephone Encounter (Signed)
Medication filled to pharmacy as requested.   

## 2015-10-31 ENCOUNTER — Other Ambulatory Visit: Payer: Self-pay | Admitting: Family Medicine

## 2015-11-01 NOTE — Telephone Encounter (Signed)
Medication filled to pharmacy as requested.   

## 2015-11-01 NOTE — Telephone Encounter (Signed)
Last OV 08/24/15 Fiorinal last filled 08/24/15 #30 with 0

## 2015-11-06 ENCOUNTER — Encounter: Payer: Self-pay | Admitting: Family Medicine

## 2015-11-06 ENCOUNTER — Other Ambulatory Visit: Payer: Self-pay

## 2015-11-06 DIAGNOSIS — Z1231 Encounter for screening mammogram for malignant neoplasm of breast: Secondary | ICD-10-CM

## 2015-11-10 ENCOUNTER — Other Ambulatory Visit: Payer: Self-pay | Admitting: Family Medicine

## 2015-11-10 NOTE — Telephone Encounter (Signed)
Medication filled to pharmacy as requested.   

## 2015-11-14 ENCOUNTER — Other Ambulatory Visit: Payer: Self-pay | Admitting: Family Medicine

## 2015-11-15 NOTE — Telephone Encounter (Signed)
Medication filled to pharmacy as requested.   

## 2015-11-23 ENCOUNTER — Telehealth: Payer: Self-pay | Admitting: Behavioral Health

## 2015-11-23 ENCOUNTER — Encounter: Payer: Self-pay | Admitting: Behavioral Health

## 2015-11-23 NOTE — Telephone Encounter (Signed)
Pre-Visit Call completed with patient and chart updated.   Pre-Visit Info documented in Specialty Comments under SnapShot.    

## 2015-11-24 ENCOUNTER — Ambulatory Visit (INDEPENDENT_AMBULATORY_CARE_PROVIDER_SITE_OTHER): Payer: 59 | Admitting: Family Medicine

## 2015-11-24 ENCOUNTER — Encounter: Payer: Self-pay | Admitting: Family Medicine

## 2015-11-24 VITALS — BP 134/84 | HR 76 | Temp 98.4°F | Resp 16 | Ht 64.0 in | Wt 242.2 lb

## 2015-11-24 DIAGNOSIS — Z1159 Encounter for screening for other viral diseases: Secondary | ICD-10-CM

## 2015-11-24 DIAGNOSIS — Z23 Encounter for immunization: Secondary | ICD-10-CM

## 2015-11-24 DIAGNOSIS — Z0000 Encounter for general adult medical examination without abnormal findings: Secondary | ICD-10-CM

## 2015-11-24 LAB — POCT URINALYSIS DIPSTICK
Bilirubin, UA: NEGATIVE
Glucose, UA: NEGATIVE
KETONES UA: NEGATIVE
LEUKOCYTES UA: NEGATIVE
NITRITE UA: NEGATIVE
PH UA: 5.5
PROTEIN UA: NEGATIVE
RBC UA: NEGATIVE
Spec Grav, UA: >=1.03
UROBILINOGEN UA: 0.2

## 2015-11-24 LAB — CBC WITH DIFFERENTIAL/PLATELET
BASOS PCT: 0.5 % (ref 0.0–3.0)
Basophils Absolute: 0 10*3/uL (ref 0.0–0.1)
EOS PCT: 2.4 % (ref 0.0–5.0)
Eosinophils Absolute: 0.2 10*3/uL (ref 0.0–0.7)
HCT: 37 % (ref 36.0–46.0)
Hemoglobin: 12.5 g/dL (ref 12.0–15.0)
LYMPHS ABS: 2 10*3/uL (ref 0.7–4.0)
Lymphocytes Relative: 27.7 % (ref 12.0–46.0)
MCHC: 33.8 g/dL (ref 30.0–36.0)
MCV: 82.1 fl (ref 78.0–100.0)
MONO ABS: 0.3 10*3/uL (ref 0.1–1.0)
MONOS PCT: 4.7 % (ref 3.0–12.0)
NEUTROS ABS: 4.6 10*3/uL (ref 1.4–7.7)
NEUTROS PCT: 64.7 % (ref 43.0–77.0)
Platelets: 279 10*3/uL (ref 150.0–400.0)
RBC: 4.51 Mil/uL (ref 3.87–5.11)
RDW: 13.8 % (ref 11.5–15.5)
WBC: 7 10*3/uL (ref 4.0–10.5)

## 2015-11-24 LAB — BASIC METABOLIC PANEL
BUN: 13 mg/dL (ref 6–23)
CALCIUM: 9.1 mg/dL (ref 8.4–10.5)
CO2: 27 meq/L (ref 19–32)
Chloride: 102 mEq/L (ref 96–112)
Creatinine, Ser: 0.56 mg/dL (ref 0.40–1.20)
GFR: 116.58 mL/min (ref >60.00)
Glucose, Bld: 108 mg/dL — ABNORMAL HIGH (ref 70–99)
POTASSIUM: 4 meq/L (ref 3.5–5.1)
SODIUM: 138 meq/L (ref 135–145)

## 2015-11-24 LAB — LIPID PANEL
Cholesterol: 224 mg/dL — ABNORMAL HIGH (ref 0–200)
HDL: 48.8 mg/dL (ref >39.00)
NONHDL: 175.51
Total CHOL/HDL Ratio: 5
Triglycerides: 249 mg/dL — ABNORMAL HIGH (ref 0.0–149.0)
VLDL: 49.8 mg/dL — ABNORMAL HIGH (ref 0.0–40.0)

## 2015-11-24 LAB — HEPATIC FUNCTION PANEL
ALBUMIN: 4.1 g/dL (ref 3.5–5.2)
ALK PHOS: 58 U/L (ref 39–117)
ALT: 7 U/L (ref 0–35)
AST: 8 U/L (ref 0–37)
BILIRUBIN DIRECT: 0.1 mg/dL (ref 0.0–0.3)
BILIRUBIN TOTAL: 0.3 mg/dL (ref 0.2–1.2)
Total Protein: 7 g/dL (ref 6.0–8.3)

## 2015-11-24 LAB — VITAMIN D 25 HYDROXY (VIT D DEFICIENCY, FRACTURES): VITD: 18.75 ng/mL — ABNORMAL LOW (ref 30.00–100.00)

## 2015-11-24 LAB — TSH: TSH: 1.5 u[IU]/mL (ref 0.35–4.50)

## 2015-11-24 LAB — HEMOGLOBIN A1C: Hgb A1c MFr Bld: 5.2 % (ref 4.6–6.5)

## 2015-11-24 LAB — LDL CHOLESTEROL, DIRECT: Direct LDL: 133 mg/dL

## 2015-11-24 NOTE — Patient Instructions (Addendum)
Follow up in 6 months to recheck BP and cholesterol We'll notify you of your lab results and make any changes if needed Continue to work on healthy diet and regular exercise- you can do it and it's good for your stress! Call with any questions or concerns If you want to join [place] at the new [place] office, any scheduled appointments will automatically transfer and we will see you at [address] [place] [address] [place], [place] [address] (OPENING 3/23) Have a great weekend! Happy [place] Birthday!!!

## 2015-11-24 NOTE — Progress Notes (Signed)
   Subjective:    Patient ID: Stacey Alvarez, female    DOB: 1956/06/07, 60 y.o.   MRN: [id number]  HPI CPE- UTD on colonoscopy, due for mammo (scheduled for 3/14), UTD on flu, due for Tdap.  No need for paps due to hysterectomy.   Review of Systems Patient reports no vision/ hearing changes, adenopathy,fever, weight change,  persistant/recurrent hoarseness , swallowing issues, chest pain, palpitations, edema, persistant/recurrent cough, hemoptysis, dyspnea (rest/exertional/paroxysmal nocturnal), gastrointestinal bleeding (melena, rectal bleeding), abdominal pain, significant heartburn, bowel changes, GU symptoms (dysuria, hematuria, incontinence), Gyn symptoms (abnormal  bleeding, pain),  syncope, focal weakness, memory loss, numbness & tingling, skin/hair/nail changes, abnormal bruising or bleeding.   + chills    Objective:   Physical Exam General Appearance:    Alert, cooperative, no distress, appears stated age  Head:    Normocephalic, without obvious abnormality, atraumatic  Eyes:    PERRL, conjunctiva/corneas clear, EOM's intact, fundi    benign, both eyes  Ears:    Normal TM's and external ear canals, both ears  Nose:   Nares normal, septum midline, mucosa normal, no drainage    or sinus tenderness  Throat:   Lips, mucosa, and tongue normal; teeth and gums normal  Neck:   Supple, symmetrical, trachea midline, no adenopathy;    Thyroid: no enlargement/tenderness/nodules  Back:     Symmetric, no curvature, ROM normal, no CVA tenderness  Lungs:     Clear to auscultation bilaterally, respirations unlabored  Chest Wall:    No tenderness or deformity   Heart:    Regular rate and rhythm, S1 and S2 normal, no murmur, rub   or gallop  Breast Exam:    Deferred to mammo  Abdomen:     Soft, non-tender, bowel sounds active all four quadrants,    no masses, no organomegaly  Genitalia:    Deferred  Rectal:    Extremities:   Extremities normal, atraumatic, no cyanosis or edema  Pulses:   2+  and symmetric all extremities  Skin:   Skin color, texture, turgor normal, no rashes or lesions  Lymph nodes:   Cervical, supraclavicular, and axillary nodes normal  Neurologic:   CNII-XII intact, normal strength, sensation and reflexes    throughout          Assessment & Plan:

## 2015-11-24 NOTE — Progress Notes (Signed)
Pre visit review using our clinic review tool, if applicable. No additional management support is needed unless otherwise documented below in the visit note. 

## 2015-11-25 LAB — HEPATITIS C ANTIBODY: HCV Ab: NEGATIVE

## 2015-11-26 NOTE — Assessment & Plan Note (Signed)
Ongoing issue for pt.  Stressed need for healthy diet and regular exercise.  Check labs to risk stratify.  Will follow 

## 2015-11-26 NOTE — Assessment & Plan Note (Signed)
Pt's PE WNL w/ exception of obesity.  UTD on mammo, colonoscopy.  No need for pap due to hysterectomy.  Check labs.  Anticipatory guidance provided.

## 2015-11-27 ENCOUNTER — Other Ambulatory Visit: Payer: Self-pay | Admitting: General Practice

## 2015-11-27 ENCOUNTER — Encounter: Payer: Self-pay | Admitting: Family Medicine

## 2015-11-27 MED ORDER — VITAMIN D (ERGOCALCIFEROL) 1.25 MG (50000 UNIT) PO CAPS: 50000.0000 [IU] | ORAL_CAPSULE | ORAL | Status: DC

## 2015-11-27 MED ORDER — EZETIMIBE 10 MG PO TABS: 10.0000 mg | ORAL_TABLET | Freq: Every day | ORAL | Status: DC

## 2015-11-28 ENCOUNTER — Encounter: Payer: Self-pay | Admitting: Family Medicine

## 2015-11-28 ENCOUNTER — Ambulatory Visit: Payer: Self-pay

## 2015-11-28 MED ORDER — EZETIMIBE 10 MG PO TABS: 10.0000 mg | ORAL_TABLET | Freq: Every day | ORAL | Status: DC

## 2015-11-28 NOTE — Telephone Encounter (Signed)
Medication filled to pharmacy as requested.   

## 2015-12-10 ENCOUNTER — Other Ambulatory Visit: Payer: Self-pay | Admitting: Family Medicine

## 2015-12-11 ENCOUNTER — Encounter: Payer: Self-pay | Admitting: Family Medicine

## 2015-12-11 ENCOUNTER — Other Ambulatory Visit: Payer: Self-pay | Admitting: *Deleted

## 2015-12-11 MED ORDER — BUTALBITAL-ASA-CAFF-CODEINE 50-325-40-30 MG PO CAPS: ORAL_CAPSULE | ORAL | Status: DC

## 2015-12-11 NOTE — Telephone Encounter (Signed)
eScribe request from [facility] for refill on Butalb/ASA/Caff/Codeine 30mg  cap Last filled - 11/01/15, #30x0 Last AEX - 11/24/15 Next AEX - 05/27/16 SLS 03/27

## 2015-12-11 NOTE — Telephone Encounter (Signed)
Medication filled to pharmacy as requested.   

## 2015-12-12 ENCOUNTER — Encounter: Payer: Self-pay | Admitting: Family Medicine

## 2015-12-13 MED ORDER — AMLODIPINE BESYLATE 5 MG PO TABS: 5.0000 mg | ORAL_TABLET | Freq: Every day | ORAL | Status: DC

## 2015-12-13 NOTE — Telephone Encounter (Signed)
Medication filled to pharmacy as requested.   

## 2015-12-28 ENCOUNTER — Encounter: Payer: Self-pay | Admitting: Family Medicine

## 2016-01-01 ENCOUNTER — Other Ambulatory Visit: Payer: Self-pay | Admitting: Family Medicine

## 2016-01-01 MED ORDER — FENOFIBRATE 160 MG PO TABS: ORAL_TABLET | ORAL | Status: DC

## 2016-01-01 NOTE — Telephone Encounter (Signed)
Medication filled to pharmacy as requested.   

## 2016-01-01 NOTE — Telephone Encounter (Signed)
Med denied.  

## 2016-01-03 ENCOUNTER — Encounter: Payer: Self-pay | Admitting: Family Medicine

## 2016-01-03 MED ORDER — CIPROFLOXACIN HCL 500 MG PO TABS: 500.0000 mg | ORAL_TABLET | Freq: Two times a day (BID) | ORAL | Status: AC

## 2016-01-03 NOTE — Telephone Encounter (Signed)
Medication filled to pharmacy as requested.   

## 2016-01-06 ENCOUNTER — Other Ambulatory Visit: Payer: Self-pay | Admitting: Family Medicine

## 2016-01-08 NOTE — Telephone Encounter (Signed)
Medication filled to pharmacy as requested.   

## 2016-01-18 ENCOUNTER — Ambulatory Visit: Payer: Self-pay | Admitting: Family Medicine

## 2016-01-22 ENCOUNTER — Other Ambulatory Visit: Payer: Self-pay | Admitting: Family Medicine

## 2016-01-22 ENCOUNTER — Encounter: Payer: Self-pay | Admitting: Family Medicine

## 2016-01-22 MED ORDER — TERCONAZOLE 0.8 % VA CREA: 1.0000 | TOPICAL_CREAM | Freq: Every day | VAGINAL | Status: DC

## 2016-01-23 NOTE — Telephone Encounter (Signed)
Medication filled to pharmacy as requested.   

## 2016-02-01 ENCOUNTER — Encounter: Payer: Self-pay | Admitting: Family Medicine

## 2016-02-01 MED ORDER — ACETAMINOPHEN-CODEINE #3 300-30 MG PO TABS: 1.0000 | ORAL_TABLET | Freq: Four times a day (QID) | ORAL | Status: DC | PRN

## 2016-02-01 NOTE — Telephone Encounter (Signed)
Medication filled to pharmacy as requested.   

## 2016-02-01 NOTE — Telephone Encounter (Signed)
Last OV 11/24/15 Tylenol #3 is written Prn and listed as historical provider?

## 2016-02-01 NOTE — Telephone Encounter (Signed)
Can you please advise on SIG?

## 2016-02-02 ENCOUNTER — Ambulatory Visit: Payer: Self-pay

## 2016-02-07 ENCOUNTER — Ambulatory Visit (INDEPENDENT_AMBULATORY_CARE_PROVIDER_SITE_OTHER): Payer: 59 | Admitting: Family Medicine

## 2016-02-07 ENCOUNTER — Encounter: Payer: Self-pay | Admitting: Family Medicine

## 2016-02-07 VITALS — BP 142/80 | HR 92 | Temp 97.9°F | Resp 16 | Ht 64.0 in | Wt 243.4 lb

## 2016-02-07 DIAGNOSIS — M549 Dorsalgia, unspecified: Secondary | ICD-10-CM | POA: Insufficient documentation

## 2016-02-07 DIAGNOSIS — M545 Low back pain, unspecified: Secondary | ICD-10-CM

## 2016-02-07 DIAGNOSIS — J0100 Acute maxillary sinusitis, unspecified: Secondary | ICD-10-CM

## 2016-02-07 MED ORDER — BUTALBITAL-ASA-CAFF-CODEINE 50-325-40-30 MG PO CAPS: ORAL_CAPSULE | ORAL | Status: DC

## 2016-02-07 MED ORDER — CYCLOBENZAPRINE HCL 10 MG PO TABS: 10.0000 mg | ORAL_TABLET | Freq: Three times a day (TID) | ORAL | Status: DC | PRN

## 2016-02-07 MED ORDER — AMOXICILLIN-POT CLAVULANATE 875-125 MG PO TABS: 1.0000 | ORAL_TABLET | Freq: Two times a day (BID) | ORAL | Status: DC

## 2016-02-07 NOTE — Progress Notes (Signed)
   Subjective:    Patient ID: Stacey Alvarez, female    DOB: June 12, 1956, 60 y.o.   MRN: [id number]  HPI HA- pt reports sxs have been 'going on for weeks'.  Thought it was a sinus infection initially- started OTC sinus meds w/o improvement.  Having pain in teeth- dentist said no abnormality.  Pain surrounding L eye, radiating into ear and jaw.  No fever but will alternate between hot/cold.  + clear nasal drainage.  + PND.  Took leftover Cipro in the medicine cabinet 2 weeks ago but 'this did nothing'.  No drainage from ear.  + dizziness.  Taking Flonase and Zyrtec daily.  L sided back pain- both lumbar and thoracic.  No recent lifting.  Pain is waking pt from sleep.  sxs started ~4 weeks ago.    Review of Systems For ROS see HPI     Objective:   Physical Exam  Constitutional: She appears well-developed and well-nourished. No distress.  HENT:  Head: Normocephalic and atraumatic.  Right Ear: Tympanic membrane normal.  Left Ear: Tympanic membrane normal.  Nose: Mucosal edema and rhinorrhea present. Right sinus exhibits no maxillary sinus tenderness and no frontal sinus tenderness. Left sinus exhibits maxillary sinus tenderness and frontal sinus tenderness.  Mouth/Throat: Uvula is midline and mucous membranes are normal. Posterior oropharyngeal erythema present. No oropharyngeal exudate.  Eyes: Conjunctivae and EOM are normal. Pupils are equal, round, and reactive to light.  Neck: Normal range of motion. Neck supple.  Cardiovascular: Normal rate, regular rhythm and normal heart sounds.   Pulmonary/Chest: Effort normal and breath sounds normal. No respiratory distress. She has no wheezes.  Musculoskeletal: Normal range of motion. She exhibits tenderness (TTP over L lumbar paraspinal muscle and L lat). She exhibits no edema.  Lymphadenopathy:    She has no cervical adenopathy.  Vitals reviewed.         Assessment & Plan:

## 2016-02-07 NOTE — Patient Instructions (Signed)
Follow up as needed Start the Augmentin twice daily- take w/ food Use the Flexeril as needed for muscle spasm and back pain- may cause drowsiness Drink plenty of fluids Continue the daily allergy medication Call with any questions or concerns Hang in there!!!

## 2016-02-07 NOTE — Progress Notes (Signed)
Pre visit review using our clinic review tool, if applicable. No additional management support is needed unless otherwise documented below in the visit note. 

## 2016-02-08 NOTE — Assessment & Plan Note (Signed)
Pt's sxs and PE consistent w/ infxn.  Start abx.  Continue daily allergy medication.  Reviewed supportive care and red flags that should prompt return.  Pt expressed understanding and is in agreement w/ plan. r

## 2016-02-08 NOTE — Assessment & Plan Note (Signed)
New.  Appears to be caused by muscle spasm.  Start Flexeril prn.  Encouraged heat/ice, gentle stretching.  Reviewed supportive care and red flags that should prompt return.  Pt expressed understanding and is in agreement w/ plan.

## 2016-03-05 ENCOUNTER — Encounter: Payer: Self-pay | Admitting: Family Medicine

## 2016-03-06 ENCOUNTER — Encounter: Payer: Self-pay | Admitting: Family Medicine

## 2016-03-06 ENCOUNTER — Other Ambulatory Visit: Payer: Self-pay | Admitting: Family Medicine

## 2016-03-06 NOTE — Telephone Encounter (Signed)
Medication filled to pharmacy as requested.   

## 2016-03-20 ENCOUNTER — Other Ambulatory Visit: Payer: Self-pay | Admitting: General Practice

## 2016-03-20 ENCOUNTER — Ambulatory Visit (INDEPENDENT_AMBULATORY_CARE_PROVIDER_SITE_OTHER): Payer: 59 | Admitting: Family Medicine

## 2016-03-20 ENCOUNTER — Encounter: Payer: Self-pay | Admitting: Family Medicine

## 2016-03-20 VITALS — BP 131/78 | HR 86 | Temp 98.1°F | Resp 16 | Ht 64.0 in | Wt 246.4 lb

## 2016-03-20 DIAGNOSIS — R109 Unspecified abdominal pain: Secondary | ICD-10-CM | POA: Diagnosis not present

## 2016-03-20 DIAGNOSIS — R319 Hematuria, unspecified: Secondary | ICD-10-CM

## 2016-03-20 LAB — POCT URINALYSIS DIPSTICK
Bilirubin, UA: NEGATIVE
Glucose, UA: NEGATIVE
Ketones, UA: NEGATIVE
Leukocytes, UA: NEGATIVE
Nitrite, UA: NEGATIVE
PH UA: 5
PROTEIN UA: NEGATIVE
SPEC GRAV UA: 1.015
UROBILINOGEN UA: 0.2

## 2016-03-20 MED ORDER — BUTALBITAL-ASA-CAFF-CODEINE 50-325-40-30 MG PO CAPS: ORAL_CAPSULE | ORAL | Status: DC

## 2016-03-20 NOTE — Progress Notes (Signed)
   Subjective:    Patient ID: Stacey Alvarez, female    DOB: 1956/06/08, 60 y.o.   MRN: [id number]  HPI Hematuria- pt reports that urine in the AM is 'dark, almost black'.  Clears up as the day goes on.  Pt has seen blood in urine.  Continues to have L flank pain that will radiate to front.  No burning w/ urination.  No frequency.  No hesitancy or incomplete bladder emptying.  Mild nausea.  sxs started >6 weeks ago.   Review of Systems For ROS see HPI     Objective:   Physical Exam  Constitutional: She is oriented to person, place, and time. She appears well-developed and well-nourished. No distress.  HENT:  Head: Normocephalic and atraumatic.  Abdominal: Soft. She exhibits no distension. There is tenderness (L CVA tenderness, no suprapubic TTP). There is no rebound and no guarding.  Neurological: She is alert and oriented to person, place, and time.  Skin: Skin is warm and dry. No rash noted. No erythema.  Psychiatric: She has a normal mood and affect. Her behavior is normal. Thought content normal.  Vitals reviewed.         Assessment & Plan:  Flank Pain- pt w/ L flank and CVA tenderness.  sxs are not consistent w/ UTI given duration, lack of dysuria/fever, etc but will send for cx to be sure.  Will get CT to r/o kidney stone.  If CT is negative for stone, will need to determine where blood is coming from.  If CT is + for stone, will need to determine size/position and if intervention is needed.  Reviewed supportive care and red flags that should prompt return.  Pt expressed understanding and is in agreement w/ plan.

## 2016-03-20 NOTE — Progress Notes (Signed)
Pre visit review using our clinic review tool, if applicable. No additional management support is needed unless otherwise documented below in the visit note. 

## 2016-03-20 NOTE — Patient Instructions (Signed)
Follow up as needed/scheduled Continue to drink plenty of fluids We'll call you with your CT scan to assess for kidney stone Call with any questions or concerns Hang in there!!

## 2016-03-21 ENCOUNTER — Ambulatory Visit (HOSPITAL_BASED_OUTPATIENT_CLINIC_OR_DEPARTMENT_OTHER)
Admission: RE | Admit: 2016-03-21 | Discharge: 2016-03-21 | Disposition: A | Discharge status: Home / Self Care | Payer: 59 | Source: Ambulatory Visit | Attending: Family Medicine | Admitting: Family Medicine

## 2016-03-21 ENCOUNTER — Other Ambulatory Visit (HOSPITAL_BASED_OUTPATIENT_CLINIC_OR_DEPARTMENT_OTHER): Payer: Self-pay

## 2016-03-21 ENCOUNTER — Other Ambulatory Visit: Payer: Self-pay | Admitting: Family Medicine

## 2016-03-21 DIAGNOSIS — R319 Hematuria, unspecified: Secondary | ICD-10-CM | POA: Insufficient documentation

## 2016-03-21 DIAGNOSIS — Z90710 Acquired absence of both cervix and uterus: Secondary | ICD-10-CM | POA: Insufficient documentation

## 2016-03-21 DIAGNOSIS — Z9049 Acquired absence of other specified parts of digestive tract: Secondary | ICD-10-CM | POA: Diagnosis not present

## 2016-03-21 DIAGNOSIS — R1012 Left upper quadrant pain: Secondary | ICD-10-CM | POA: Diagnosis not present

## 2016-03-21 DIAGNOSIS — R109 Unspecified abdominal pain: Secondary | ICD-10-CM | POA: Insufficient documentation

## 2016-03-21 DIAGNOSIS — N281 Cyst of kidney, acquired: Secondary | ICD-10-CM | POA: Diagnosis not present

## 2016-03-22 ENCOUNTER — Other Ambulatory Visit: Payer: Self-pay | Admitting: Family Medicine

## 2016-03-22 ENCOUNTER — Encounter: Payer: Self-pay | Admitting: Family Medicine

## 2016-03-22 DIAGNOSIS — R319 Hematuria, unspecified: Secondary | ICD-10-CM

## 2016-03-26 ENCOUNTER — Other Ambulatory Visit: Payer: Self-pay | Admitting: Family Medicine

## 2016-03-26 ENCOUNTER — Encounter: Payer: Self-pay | Admitting: Family Medicine

## 2016-03-26 DIAGNOSIS — R911 Solitary pulmonary nodule: Secondary | ICD-10-CM

## 2016-03-28 ENCOUNTER — Telehealth: Payer: Self-pay | Admitting: Internal Medicine

## 2016-03-28 ENCOUNTER — Encounter: Payer: Self-pay | Admitting: Family Medicine

## 2016-03-28 NOTE — Telephone Encounter (Signed)
Patient has recurrent LLQ pain.  She feels may be from recurrent diverticulosis.  She will come in and see Alonza Bogus, PA on 04/02/16 10:30

## 2016-04-02 ENCOUNTER — Other Ambulatory Visit: Payer: Self-pay | Admitting: Family Medicine

## 2016-04-02 NOTE — Telephone Encounter (Signed)
Medication filled to pharmacy as requested.   

## 2016-04-03 ENCOUNTER — Ambulatory Visit: Payer: Self-pay | Admitting: Gastroenterology

## 2016-04-04 ENCOUNTER — Ambulatory Visit
Admission: RE | Admit: 2016-04-04 | Discharge: 2016-04-04 | Disposition: A | Discharge status: Home / Self Care | Payer: 59 | Source: Ambulatory Visit

## 2016-04-04 DIAGNOSIS — Z1231 Encounter for screening mammogram for malignant neoplasm of breast: Secondary | ICD-10-CM

## 2016-04-05 ENCOUNTER — Encounter: Payer: Self-pay | Admitting: Internal Medicine

## 2016-04-05 ENCOUNTER — Ambulatory Visit (INDEPENDENT_AMBULATORY_CARE_PROVIDER_SITE_OTHER): Payer: 59 | Admitting: Internal Medicine

## 2016-04-05 VITALS — BP 124/74 | HR 106 | Ht 64.0 in | Wt 248.0 lb

## 2016-04-05 DIAGNOSIS — Z87898 Personal history of other specified conditions: Secondary | ICD-10-CM

## 2016-04-05 DIAGNOSIS — R911 Solitary pulmonary nodule: Secondary | ICD-10-CM | POA: Insufficient documentation

## 2016-04-05 DIAGNOSIS — R05 Cough: Secondary | ICD-10-CM | POA: Diagnosis not present

## 2016-04-05 DIAGNOSIS — R0989 Other specified symptoms and signs involving the circulatory and respiratory systems: Secondary | ICD-10-CM | POA: Insufficient documentation

## 2016-04-05 DIAGNOSIS — R053 Chronic cough: Secondary | ICD-10-CM | POA: Insufficient documentation

## 2016-04-05 DIAGNOSIS — Z8669 Personal history of other diseases of the nervous system and sense organs: Secondary | ICD-10-CM | POA: Insufficient documentation

## 2016-04-05 NOTE — Progress Notes (Signed)
Subjective:     Patient ID: Stacey Alvarez, female   DOB: 07-03-1956, 60 y.o.   MRN: [id number] PCP Annye Asa, MD   HPI  Stacey Alvarez 04/05/2016  Chief Complaint  Patient presents with  . Advice Only    refer Dr. Birdie Riddle for lung nodule.    60 year old female presents with her husband for evaluation of pulmonary nodule she also has cough and on ACE inhibitors. She has a history of sleep apnea  -In terms of lung nodule: She's had some left infrascapular pain and left upper quadrant pain. According to her history. She then underwent a CT scan renal stone study 03/21/2016 that showed a 7 mm irregular solid lung nodule in the left upper lung area. This is new since 2015. She is extremely worried that this might be lung cancer because she has had friends die from it particularly one person was told that her nodule to be watched but a year related ended up being terminal lung cancer and the patient died. Patient herself hardly ever smoke. She is not having any weight loss, hemoptysis, shortness of breath. Because of the irregular shape of the nodule she did see me worried that this is indeed lung cancer.  -Other problem cough: She has chronic cough which he says is due to postnasal drip and happens when she uses her CPAP regularly at night. She does use nasal steroid. She is on ACE inhibitor. She is aware that ACE inhibitor scan cause cough but she does not think that it is the ACE inhibitor causing the cough although she describes throat irritation and other symptoms that can be consistent with ACE inhibitor cough. She does not want stop her ACE inhibitor because it really works well for blood pressure she has problems tolerating medications in general. RSI cough score is 9 and details of the quality of cough is documented below. There are no clear cut aggravating or relieving factors.  off note she does not have diagnosis of asthma pulmonary fibrosis or dyspnea. Exam did show bilateral bibasal crackles with a  CT scan of the abdomen which was not high-resolution did not clearly show pulmonary fibrosis   Dr Lorenza [place] Reflux Symptom Index (> 13-15 suggestive of LPR cough) 0 -> 5  =  none ->severe problem  Hoarseness of problem with voice 0  Clearing  Of Throat 2  Excess throat mucus or feeling of post nasal drip 3  Difficulty swallowing food, liquid or tablets 1  Cough after eating or lying down 0  Breathing difficulties or choking episodes 0  Troublesome or annoying cough 1 in pm  Sensation of something sticking in throat or lump in throat 1  Heartburn, chest pain, indigestion, or stomach acid coming up 1  TOTAL 9     Sleep apnea: She was diagnosed with sleep apnea few to several years ago. She is on nocturnal CPAP. She is to have a sleep Dr. Viona Gilmore [facility] but she is now relocated to [place] where she is originally from. She does not have a sleep doc at some point she would want to sleep doc.     has a past medical history of Hypertension; Anxiety; Sleep apnea; Fibromyalgia; IBS (irritable bowel syndrome); Depression; Allergy; Arthritis; GERD (gastroesophageal reflux disease); and Hyperlipidemia.   reports that she has quit smoking. She has never used smokeless tobacco.  Past Surgical History  Procedure Laterality Date  . Partial hysterectomy  1999  . Cholecystectomy    . Colonoscopy    .  Polypectomy      Allergies  Allergen Reactions  . Sulfa Antibiotics Rash    Immunization History  Administered Date(s) Administered  . Influenza,inj,Quad PF,36+ Mos 06/16/2013  . Influenza-Unspecified 09/16/2014, 11/19/2015  . Tdap 10/28/2005, 11/24/2015    Family History  Problem Relation Age of Onset  . Diabetes Mother   . Heart disease Mother   . Anxiety disorder Mother   . Depression Mother   . Kidney disease Mother   . Diabetes Father   . Heart disease Father   . Anxiety disorder Father   . Depression Father   . Diabetes Paternal Grandmother   . Cystic fibrosis  Son   . Alcohol abuse Son      Current outpatient prescriptions:  .  ALPRAZolam (XANAX) 1 MG tablet, 0.5 mg in the am and 2mg  at night, Disp: , Rfl:  .  amLODipine (NORVASC) 5 MG tablet, Take 1 tablet by mouth at  bedtime, Disp: 90 tablet, Rfl: 1 .  butalbital-aspirin-caffeine-codeine (FIORINAL WITH CODEINE) 50-325-40-30 MG capsule, TAKE 1 CAPSULE BY MOUTH EVERY 6 HOURS AS NEEDED FOR PAIN, Disp: 30 capsule, Rfl: 0 .  cetirizine (ZYRTEC) 10 MG tablet, Take 10 mg by mouth daily., Disp: , Rfl:  .  fluticasone (FLONASE) 50 MCG/ACT nasal spray, Use 2 sprays in each  nostril daily, Disp: 48 g, Rfl: 1 .  HYDROcodone-acetaminophen (NORCO/VICODIN) 5-325 MG tablet, , Disp: , Rfl:  .  lamoTRIgine (LAMICTAL) 100 MG tablet, Take 100 mg by mouth daily., Disp: , Rfl:  .  lisinopril (PRINIVIL,ZESTRIL) 20 MG tablet, Take 1 tablet by mouth two  times daily, Disp: 180 tablet, Rfl: 1 .  PREMARIN 0.625 MG tablet, Take 1 tablet by mouth  (0.625mg ) daily, Disp: 90 tablet, Rfl: 1 .  promethazine (PHENERGAN) 25 MG tablet, TAKE 1 TABLET(25 MG) BY MOUTH EVERY 8 HOURS AS NEEDED FOR NAUSEA OR VOMITING, Disp: 45 tablet, Rfl: 0 .  ranitidine (ZANTAC) 150 MG tablet, Take 1 tablet by mouth two  times daily, Disp: 180 tablet, Rfl: 1 .  temazepam (RESTORIL) 30 MG capsule, Take 30 mg by mouth at bedtime., Disp: , Rfl:  .  venlafaxine XR (EFFEXOR-XR) 75 MG 24 hr capsule, Take 225 mg by mouth every morning. , Disp: , Rfl:      Review of Systems  Constitutional: Negative for fever and unexpected weight change.  HENT: Negative for congestion, dental problem, ear pain, nosebleeds, postnasal drip, rhinorrhea, sinus pressure, sneezing, sore throat and trouble swallowing.   Eyes: Negative for redness and itching.  Respiratory: Positive for cough. Negative for chest tightness, shortness of breath and wheezing.   Cardiovascular: Negative for palpitations and leg swelling.  Gastrointestinal: Negative for nausea and vomiting.   Genitourinary: Negative for dysuria.  Musculoskeletal: Positive for arthralgias. Negative for joint swelling.  Skin: Negative for rash.  Neurological: Positive for headaches.  Hematological: Does not bruise/bleed easily.  Psychiatric/Behavioral: Positive for dysphoric mood. The patient is nervous/anxious.        Objective:   Physical Exam  Constitutional: She is oriented to person, place, and time. She appears well-developed and well-nourished. No distress.  HENT:  Head: Normocephalic and atraumatic.  Right Ear: External ear normal.  Left Ear: External ear normal.  Mouth/Throat: Oropharynx is clear and moist. No oropharyngeal exudate.  Eyes: Conjunctivae and EOM are normal. Pupils are equal, round, and reactive to light. Right eye exhibits no discharge. Left eye exhibits no discharge. No scleral icterus.  Neck: Normal range of motion. Neck supple. No  JVD present. No tracheal deviation present. No thyromegaly present.  Cardiovascular: Normal rate, regular rhythm, normal heart sounds and intact distal pulses.  Exam reveals no gallop and no friction rub.   No murmur heard. Pulmonary/Chest: Effort normal. No respiratory distress. She has no wheezes. She has rales. She exhibits no tenderness.  Bilateral crackles +  Abdominal: Soft. Bowel sounds are normal. She exhibits no distension and no mass. There is no tenderness. There is no rebound and no guarding.  Musculoskeletal: Normal range of motion. She exhibits no edema or tenderness.  Lymphadenopathy:    She has no cervical adenopathy.  Neurological: She is alert and oriented to person, place, and time. She has normal reflexes. No cranial nerve deficit. She exhibits normal muscle tone. Coordination normal.  Skin: Skin is warm and dry. No rash noted. She is not diaphoretic. No erythema. No pallor.  Psychiatric: She has a normal mood and affect. Her behavior is normal. Judgment and thought content normal.  Vitals reviewed.   Filed Vitals:    04/05/16 1020  BP: 124/74  Pulse: 106  Height: 5\' 4"  (1.626 m)  Weight: 248 lb (112.492 kg)  SpO2: 94%        Assessment:       ICD-9-CM ICD-10-CM   1. Incidental lung nodule, > 29mm and < 52mm 793.11 R91.1   2. Chronic cough 786.2 R05   3. Bibasilar crackles 786.7 R09.89   4. History of sleep apnea V13.89 Z87.898    #Lung nodule: Given the fact she is a nonsmoker and it is less than 8 mm in size even though it is new since 2015 I highly doubt this is lung cancer. I would still put the overall pretest probability as low. Having said that even if it is of moderate risk we do not have technologies accurately diagnose this nodule. This is because the bronchoscopy even navigational bronchoscopy has a high nondiagnostic rate in the nodules are less than 8 mm. Genomic blood test or percepta bronchial brush can be helpful in the nodules greater than 8 mm and is a moderate pretest probability. Venous best to get a full CT scan of the chest and really characterize this nodule  #Chronic cough and basal crackles: This cough sounds like ACE inhibitor cough but she does not want to stop ACE inhibitor. Given the fact she has bibasal crackles best to get high-resolution CT scan of the chest supine and prone images and rule out ILD. We will also get pulmonary function test depending on the course. For now continue ACE inhibitors at her desire  #History of sleep apnea: At some point in time in the future will refer her to sleep specialist    Plan:       Do HRCT chest  - will call with results to decide next step - regarding nodules and crackles  - [facility]  At some point we will discuss again role of ace inhibitor on cough but respect your desire to continue it  Continue CPAP and nasal steroids as before  At some point we can refer you to one of our sleep docs   Dr. Brand Males, M.D., [facility].C.P Pulmonary and Critical Care Medicine Staff Physician [facility] Pager: [phone], If no answer or between  15:00h - 7:00h: call [phone]  04/05/2016 2:10 PM

## 2016-04-05 NOTE — Patient Instructions (Addendum)
ICD-9-CM ICD-10-CM   1. Incidental lung nodule, > 64mm and < 33mm 793.11 R91.1   2. Chronic cough 786.2 R05   3. Bibasilar crackles 786.7 R09.89   4. History of sleep apnea V13.89 Z87.898     Do HRCT chest  - will call with results to decide next step - regarding nodules and crackles  - [facility]    At some point we will discuss again role of ace inhibitor on cough but respect your desire to continue it  Continue CPAP and nasal steroids as before  At some point we can refer you to one of our sleep docs

## 2016-04-06 ENCOUNTER — Other Ambulatory Visit: Payer: Self-pay | Admitting: Family Medicine

## 2016-04-08 NOTE — Telephone Encounter (Signed)
Medication filled to pharmacy as requested.   

## 2016-04-10 ENCOUNTER — Telehealth: Payer: Self-pay | Admitting: Internal Medicine

## 2016-04-10 ENCOUNTER — Ambulatory Visit (HOSPITAL_BASED_OUTPATIENT_CLINIC_OR_DEPARTMENT_OTHER)
Admission: RE | Admit: 2016-04-10 | Discharge: 2016-04-10 | Disposition: A | Discharge status: Home / Self Care | Payer: 59 | Source: Ambulatory Visit | Attending: Internal Medicine | Admitting: Internal Medicine

## 2016-04-10 DIAGNOSIS — I2510 Atherosclerotic heart disease of native coronary artery without angina pectoris: Secondary | ICD-10-CM | POA: Diagnosis not present

## 2016-04-10 DIAGNOSIS — I700 Atherosclerosis of aorta: Secondary | ICD-10-CM | POA: Diagnosis not present

## 2016-04-10 DIAGNOSIS — R0989 Other specified symptoms and signs involving the circulatory and respiratory systems: Secondary | ICD-10-CM

## 2016-04-10 DIAGNOSIS — R918 Other nonspecific abnormal finding of lung field: Secondary | ICD-10-CM | POA: Diagnosis not present

## 2016-04-10 DIAGNOSIS — R911 Solitary pulmonary nodule: Secondary | ICD-10-CM

## 2016-04-10 NOTE — Telephone Encounter (Signed)
     Ct Chest High Resolution  Result Date: 04/10/2016 CLINICAL DATA:  60 year old female with history of pulmonary nodule noted on prior CT the abdomen and pelvis. Followup study. EXAM: CT CHEST WITHOUT CONTRAST TECHNIQUE: Multidetector CT imaging of the chest was performed following the standard protocol without intravenous contrast. High resolution imaging of the lungs, as well as inspiratory and expiratory imaging, was performed. COMPARISON:  No prior chest CT. CT the abdomen and pelvis 03/21/2016. FINDINGS: Cardiovascular: Heart size is normal. There is no significant pericardial fluid, thickening or pericardial calcification. There is aortic atherosclerosis, as well as atherosclerosis of the great vessels of the mediastinum and the coronary arteries, including calcified atherosclerotic plaque in the left anterior descending and right coronary arteries. Mediastinum/Nodes: No pathologically enlarged mediastinal or hilar lymph nodes. Please note that accurate exclusion of hilar adenopathy is limited on noncontrast CT scans. Esophagus is unremarkable in appearance. No axillary lymphadenopathy. Lungs/Pleura: The previously noted ground-glass attenuation nodule in the periphery of the lingula is no longer identified, presumably infectious or inflammatory on the prior study. No other suspicious appearing pulmonary nodules or masses are noted on today's examination. High-resolution images demonstrate some patchy areas of ground-glass attenuation and septal thickening, most evident in the medial aspect of the right lower lobe, basal segments of the left lower lobe, and medial segment of the right middle lobe where there is some associated thickening of the peribronchovascular interstitium, mild cylindrical traction bronchiectasis and some peripheral bronchiolectasis. No frank honeycombing is identified. Inspiratory and expiratory imaging demonstrates some mild air trapping, indicative of small airways disease. No  acute consolidative airspace disease. No pleural effusions. Upper Abdomen: Status post cholecystectomy.  Aortic atherosclerosis. Musculoskeletal: There are no aggressive appearing lytic or blastic lesions noted in the visualized portions of the skeleton. IMPRESSION: 1. Previously noted ground-glass attenuation nodule in the lingula has resolved, indicative of an infectious/inflammatory nodule on the prior study. No new suspicious pulmonary nodules are noted. 2. Subtle findings in the lungs which may indicate interstitial lung disease such as nonspecific interstitial pneumonia (NSIP). Repeat high-resolution chest CT is recommended in 12 months to assess for further temporal changes in the appearance of the lung parenchyma. 3. Mild air trapping, indicative of small airways disease. 4. Aortic atherosclerosis, in addition to 2 vessel coronary artery disease. Please note that although the presence of coronary artery calcium documents the presence of coronary artery disease, the severity of this disease and any potential stenosis cannot be assessed on this non-gated CT examination. Assessment for potential risk factor modification, dietary therapy or pharmacologic therapy may be warranted, if clinically indicated. 5. Additional incidental findings, as above. Electronically Signed   By: Vinnie Langton M.D.   On: 04/10/2016 16:11   Let Stacey Alvarez know  A) nodule seen on CT abd left lingula area has resolved - so this is not cancer  B) no new nodules  C) possible ILD present to explain her crackles- I like her to do PFT first available and return to see me for followup first avail fine  D) co art atherosclerosis + - proably related to aging but to be on safe side refer cardiology  Thanks  Dr. Brand Males, M.D., [facility].C.P Pulmonary and Critical Care Medicine Staff Physician [place] [facility] Pager: [phone], If no answer or between  15:00h - 7:00h: call [phone]  04/10/2016 5:44 PM

## 2016-04-12 ENCOUNTER — Encounter: Payer: Self-pay | Admitting: Family Medicine

## 2016-04-12 NOTE — Telephone Encounter (Signed)
Spoke with pt, aware of results/recs.  pft and cardiology referral placed.  rov scheduled on 8/25.  Nothing further needed.

## 2016-05-01 ENCOUNTER — Institutional Professional Consult (permissible substitution): Payer: Self-pay | Admitting: Pulmonary Disease

## 2016-05-02 ENCOUNTER — Encounter: Payer: Self-pay | Admitting: Cardiology

## 2016-05-06 ENCOUNTER — Encounter: Payer: Self-pay | Admitting: Family Medicine

## 2016-05-10 ENCOUNTER — Ambulatory Visit: Payer: Self-pay | Admitting: Internal Medicine

## 2016-05-14 ENCOUNTER — Other Ambulatory Visit: Payer: Self-pay | Admitting: Family Medicine

## 2016-05-14 MED ORDER — BUTALBITAL-ASA-CAFF-CODEINE 50-325-40-30 MG PO CAPS: ORAL_CAPSULE | ORAL | 0 refills | Status: DC

## 2016-05-14 MED ORDER — PROMETHAZINE HCL 25 MG PO TABS: 25.0000 mg | ORAL_TABLET | Freq: Three times a day (TID) | ORAL | 0 refills | Status: DC | PRN

## 2016-05-14 NOTE — Telephone Encounter (Signed)
Last OV 03/20/16 Fiorinal last filled 03/20/16 #30 with 0 Promethazine last filled 12/11/15 #45 with 0

## 2016-05-14 NOTE — Telephone Encounter (Signed)
Medication filled to pharmacy as requested.   

## 2016-05-15 ENCOUNTER — Ambulatory Visit: Payer: 59 | Admitting: Cardiology

## 2016-05-17 ENCOUNTER — Encounter: Payer: Self-pay | Admitting: Family Medicine

## 2016-05-20 ENCOUNTER — Encounter: Payer: Self-pay | Admitting: Family Medicine

## 2016-05-21 ENCOUNTER — Other Ambulatory Visit: Payer: Self-pay | Admitting: Family Medicine

## 2016-05-21 MED ORDER — RANITIDINE HCL 150 MG PO TABS: 150.0000 mg | ORAL_TABLET | Freq: Two times a day (BID) | ORAL | 1 refills | Status: DC

## 2016-05-23 ENCOUNTER — Ambulatory Visit (INDEPENDENT_AMBULATORY_CARE_PROVIDER_SITE_OTHER): Payer: 59 | Admitting: Family Medicine

## 2016-05-23 ENCOUNTER — Encounter: Payer: Self-pay | Admitting: Family Medicine

## 2016-05-23 VITALS — BP 138/60 | HR 109 | Temp 97.9°F | Ht 64.0 in | Wt 246.0 lb

## 2016-05-23 DIAGNOSIS — N3000 Acute cystitis without hematuria: Secondary | ICD-10-CM | POA: Diagnosis not present

## 2016-05-23 LAB — POC URINALSYSI DIPSTICK (AUTOMATED)
BILIRUBIN UA: NEGATIVE
Glucose, UA: NEGATIVE
KETONES UA: NEGATIVE
Nitrite, UA: NEGATIVE
PH UA: 6
RBC UA: NEGATIVE
Spec Grav, UA: 1.025
Urobilinogen, UA: 0.2

## 2016-05-23 MED ORDER — TERCONAZOLE 0.8 % VA CREA: 1.0000 | TOPICAL_CREAM | Freq: Every day | VAGINAL | 0 refills | Status: DC

## 2016-05-23 MED ORDER — CIPROFLOXACIN HCL 250 MG PO TABS: 250.0000 mg | ORAL_TABLET | Freq: Two times a day (BID) | ORAL | 0 refills | Status: DC

## 2016-05-23 NOTE — Progress Notes (Signed)
Chief Complaint  Patient presents with  . Urinary Tract Infection    pt states having burning before and after urinating along with freq. urination-x 5 days    Stacey Alvarez is a 60 y.o. here for possible UTI.  Duration: 5 days. Symptoms: urinary frequency and burning with urination Denies: hematuria, fever, chills and urinary incontinence, vaginal discharge Hx of recurrent UTI? No; has had UTI's in the past and this is a typical presentation for one. Denies new sexual partners.  ROS:  Constitutional: denies fever GU: As noted in HPI MSK: Denies back pain Abd: Denies constipation or abdominal pain  Past Medical History:  Diagnosis Date  . Allergy   . Anxiety   . Arthritis    KNEES,BACK,HIPS  . Depression   . Fibromyalgia   . GERD (gastroesophageal reflux disease)   . Hyperlipidemia   . Hypertension   . IBS (irritable bowel syndrome)   . Sleep apnea    Family History  Problem Relation Age of Onset  . Diabetes Mother   . Heart disease Mother   . Anxiety disorder Mother   . Depression Mother   . Kidney disease Mother   . Diabetes Father   . Heart disease Father   . Anxiety disorder Father   . Depression Father   . Diabetes Paternal Grandmother   . Cystic fibrosis Son   . Alcohol abuse Son    Social History   Social History  . Marital status: Married   Social History Main Topics  . Smoking status: Former Research scientist (life sciences)  . Smokeless tobacco: Never Used     Comment: smoked 2 years in late teens about 8-10 cigs daily.   . Alcohol use 0.0 oz/week     Comment: OCC  . Drug use: No  . Sexual activity: Yes   BP 138/60 (BP Location: Left Arm, Patient Position: Sitting, Cuff Size: Large)   Pulse (!) 109   Temp 97.9 F (36.6 C) (Oral)   Ht 5\' 4"  (1.626 m)   Wt 246 lb (111.6 kg)   SpO2 95%   BMI 42.23 kg/m  General: Awake, alert, appears stated age Heart: RRR, no murmurs Lungs: CTAB, normal respiratory effort, no accessory muscle usage Abd: BS+, soft, NT, ND, no  masses or organomegaly MSK: No CVA tenderness, neg Lloyd's sign Psych: Age appropriate judgment and insight  Acute cystitis without hematuria - Plan: ciprofloxacin (CIPRO) 250 MG tablet, terconazole (TERAZOL 3) 0.8 % vaginal cream, POCT Urinalysis Dipstick (Automated), Urine culture  Orders as above. Allergic to sulfa and does not tolerate macrobid well. She also has hx of yeast infections with abx use so will use Terazol for coverage of this- she has lip swelling with Diflucan. F/u in 7 days if symptoms fail to improve, sooner if things worsen. The patient voiced understanding and agreement to the plan.  [place], DO 05/23/16 2:27 PM

## 2016-05-23 NOTE — Progress Notes (Signed)
Pre visit review using our clinic review tool, if applicable. No additional management support is needed unless otherwise documented below in the visit note. 

## 2016-05-24 LAB — URINE CULTURE: Organism ID, Bacteria: 10000

## 2016-05-27 ENCOUNTER — Ambulatory Visit: Payer: Self-pay | Admitting: Family Medicine

## 2016-05-30 ENCOUNTER — Encounter (INDEPENDENT_AMBULATORY_CARE_PROVIDER_SITE_OTHER): Payer: 59 | Admitting: Internal Medicine

## 2016-05-30 DIAGNOSIS — R0989 Other specified symptoms and signs involving the circulatory and respiratory systems: Secondary | ICD-10-CM

## 2016-05-30 LAB — PULMONARY FUNCTION TEST
DL/VA % pred: 101 %
DL/VA: 4.97 ml/min/mmHg/L
DLCO COR: 15.61 ml/min/mmHg
DLCO cor % pred: 60 %
DLCO unc % pred: 62 %
DLCO unc: 15.89 ml/min/mmHg
FEF 25-75 PRE: 2.8 L/s
FEF 25-75 Post: 3.61 L/sec
FEF2575-%Change-Post: 28 %
FEF2575-%PRED-POST: 156 %
FEF2575-%Pred-Pre: 121 %
FEV1-%Change-Post: 9 %
FEV1-%PRED-PRE: 66 %
FEV1-%Pred-Post: 72 %
FEV1-POST: 1.87 L
FEV1-PRE: 1.71 L
FEV1FVC-%CHANGE-POST: 4 %
FEV1FVC-%Pred-Pre: 114 %
FEV6-%CHANGE-POST: 5 %
FEV6-%PRED-PRE: 59 %
FEV6-%Pred-Post: 62 %
FEV6-POST: 2.02 L
FEV6-PRE: 1.91 L
FEV6FVC-%Change-Post: 0 %
FEV6FVC-%PRED-POST: 104 %
FEV6FVC-%PRED-PRE: 103 %
FVC-%CHANGE-POST: 5 %
FVC-%PRED-POST: 60 %
FVC-%PRED-PRE: 57 %
FVC-Post: 2.02 L
FVC-Pre: 1.92 L
POST FEV6/FVC RATIO: 100 %
Post FEV1/FVC ratio: 93 %
Pre FEV1/FVC ratio: 89 %
Pre FEV6/FVC Ratio: 100 %
RV % PRED: 132 %
RV: 2.76 L
TLC % PRED: 94 %
TLC: 4.92 L

## 2016-05-31 ENCOUNTER — Ambulatory Visit (INDEPENDENT_AMBULATORY_CARE_PROVIDER_SITE_OTHER): Payer: 59 | Admitting: Internal Medicine

## 2016-05-31 ENCOUNTER — Other Ambulatory Visit (INDEPENDENT_AMBULATORY_CARE_PROVIDER_SITE_OTHER): Payer: 59

## 2016-05-31 ENCOUNTER — Encounter: Payer: Self-pay | Admitting: Internal Medicine

## 2016-05-31 VITALS — BP 138/76 | HR 84 | Ht 64.0 in | Wt 245.0 lb

## 2016-05-31 DIAGNOSIS — R0989 Other specified symptoms and signs involving the circulatory and respiratory systems: Secondary | ICD-10-CM | POA: Diagnosis not present

## 2016-05-31 DIAGNOSIS — R0600 Dyspnea, unspecified: Secondary | ICD-10-CM | POA: Diagnosis not present

## 2016-05-31 DIAGNOSIS — R05 Cough: Secondary | ICD-10-CM | POA: Diagnosis not present

## 2016-05-31 DIAGNOSIS — I2510 Atherosclerotic heart disease of native coronary artery without angina pectoris: Secondary | ICD-10-CM | POA: Diagnosis not present

## 2016-05-31 DIAGNOSIS — R053 Chronic cough: Secondary | ICD-10-CM

## 2016-05-31 DIAGNOSIS — R911 Solitary pulmonary nodule: Secondary | ICD-10-CM

## 2016-05-31 LAB — CARDIAC PANEL
CK TOTAL: 28 U/L (ref 7–177)
CK-MB: 0.7 ng/mL (ref 0.3–4.0)
Relative Index: 2.5 calc (ref 0.0–2.5)

## 2016-05-31 LAB — SEDIMENTATION RATE: SED RATE: 12 mm/h (ref 0–30)

## 2016-05-31 NOTE — Progress Notes (Signed)
Subjective:     Patient ID: Stacey Alvarez, female   DOB: 03-09-1956, 60 y.o.   MRN: [id number]  PCP Annye Asa, MD   HPI  HPI  IOV 04/05/2016  Chief Complaint  Patient presents with  . Advice Only    refer Dr. Birdie Riddle for lung nodule.    60 year old female presents with her husband for evaluation of pulmonary nodule she also has cough and on ACE inhibitors. She has a history of sleep apnea  -In terms of lung nodule: She's had some left infrascapular pain and left upper quadrant pain. According to her history. She then underwent a CT scan renal stone study 03/21/2016 that showed a 7 mm irregular solid lung nodule in the left upper lung area. This is new since 2015. She is extremely worried that this might be lung cancer because she has had friends die from it particularly one person was told that her nodule to be watched but a year related ended up being terminal lung cancer and the patient died. Patient herself hardly ever smoke. She is not having any weight loss, hemoptysis, shortness of breath. Because of the irregular shape of the nodule she did see me worried that this is indeed lung cancer.  -Other problem cough: She has chronic cough which he says is due to postnasal drip and happens when she uses her CPAP regularly at night. She does use nasal steroid. She is on ACE inhibitor. She is aware that ACE inhibitor scan cause cough but she does not think that it is the ACE inhibitor causing the cough although she describes throat irritation and other symptoms that can be consistent with ACE inhibitor cough. She does not want stop her ACE inhibitor because it really works well for blood pressure she has problems tolerating medications in general. RSI cough score is 9 and details of the quality of cough is documented below. There are no clear cut aggravating or relieving factors.  off note she does not have diagnosis of asthma pulmonary fibrosis or dyspnea. Exam did show bilateral bibasal  crackles with a CT scan of the abdomen which was not high-resolution did not clearly show pulmonary fibrosis   Dr Lorenza Cambridge Reflux Symptom Index (> 13-15 suggestive of LPR cough) 0 -> 5  =  none ->severe problem  Hoarseness of problem with voice 0  Clearing  Of Throat 2  Excess throat mucus or feeling of post nasal drip 3  Difficulty swallowing food, liquid or tablets 1  Cough after eating or lying down 0  Breathing difficulties or choking episodes 0  Troublesome or annoying cough 1 in pm  Sensation of something sticking in throat or lump in throat 1  Heartburn, chest pain, indigestion, or stomach acid coming up 1  TOTAL 9     Sleep apnea: She was diagnosed with sleep apnea few to several years ago. She is on nocturnal CPAP. She is to have a sleep Dr. Viona [facility] but she is now relocated to [place] where she is originally from. She does not have a sleep doc at some point she would want to sleep doc.    OV  05/31/2016  Chief Complaint  Patient presents with  . Follow-up    review PFT.  pt c/o sob with exertion, occasional nonprod cough.       Ct Chest High Resolution  Result Date: 04/10/2016 CLINICAL DATA:  60 year old female with history of pulmonary nodule noted on prior CT the abdomen and pelvis. Followup study. EXAM:  CT CHEST WITHOUT CONTRAST TECHNIQUE: Multidetector CT imaging of the chest was performed following the standard protocol without intravenous contrast. High resolution imaging of the lungs, as well as inspiratory and expiratory imaging, was performed. COMPARISON:  No prior chest CT. CT the abdomen and pelvis 03/21/2016. FINDINGS: Cardiovascular: Heart size is normal. There is no significant pericardial fluid, thickening or pericardial calcification. There is aortic atherosclerosis, as well as atherosclerosis of the great vessels of the mediastinum and the coronary arteries, including calcified atherosclerotic plaque in the left anterior descending and  right coronary arteries. Mediastinum/Nodes: No pathologically enlarged mediastinal or hilar lymph nodes. Please note that accurate exclusion of hilar adenopathy is limited on noncontrast CT scans. Esophagus is unremarkable in appearance. No axillary lymphadenopathy. Lungs/Pleura: The previously noted ground-glass attenuation nodule in the periphery of the lingula is no longer identified, presumably infectious or inflammatory on the prior study. No other suspicious appearing pulmonary nodules or masses are noted on today's examination. High-resolution images demonstrate some patchy areas of ground-glass attenuation and septal thickening, most evident in the medial aspect of the right lower lobe, basal segments of the left lower lobe, and medial segment of the right middle lobe where there is some associated thickening of the peribronchovascular interstitium, mild cylindrical traction bronchiectasis and some peripheral bronchiolectasis. No frank honeycombing is identified. Inspiratory and expiratory imaging demonstrates some mild air trapping, indicative of small airways disease. No acute consolidative airspace disease. No pleural effusions. Upper Abdomen: Status post cholecystectomy.  Aortic atherosclerosis. Musculoskeletal: There are no aggressive appearing lytic or blastic lesions noted in the visualized portions of the skeleton. IMPRESSION: 1. Previously noted ground-glass attenuation nodule in the lingula has resolved, indicative of an infectious/inflammatory nodule on the prior study. No new suspicious pulmonary nodules are noted. 2. Subtle findings in the lungs which may indicate interstitial lung disease such as nonspecific interstitial pneumonia (NSIP). Repeat high-resolution chest CT is recommended in 12 months to assess for further temporal changes in the appearance of the lung parenchyma. 3. Mild air trapping, indicative of small airways disease. 4. Aortic atherosclerosis, in addition to 2 vessel coronary  artery disease. Please note that although the presence of coronary artery calcium documents the presence of coronary artery disease, the severity of this disease and any potential stenosis cannot be assessed on this non-gated CT examination. Assessment for potential risk factor modification, dietary therapy or pharmacologic therapy may be warranted, if clinically indicated. 5. Additional incidental findings, as above. Electronically Signed   By: Vinnie Langton M.D.   On: 04/10/2016 16:11   ebbie Delconte discussed with her and husband  A) nodule seen on CT abd left lingula area has resolved - so this is not cancer  B) no new nodules  C) possible ILD present to explain her crackles- she had pulmonary function test 05/30/2016 that showed FVC 1.92 L/57%, FEV1 1.7 L/66% with a ratio of 89 suggestive of restriction. There was only 9% bronchodilator response. Total lung capacity 4.92 L/94%. DLCO 15.6/60% and reduced in all suggestive of intrinsic parenchymal lung disease with restriction. 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 Walking desaturation test 185 feet 3 laps and rheumatic: She became short of breath the second lap did not have chest pain did not desaturate.   D) co art atherosclerosis + - proably related to agingl has appt with cards   E) the want to cancel sleep Dr. appointment. Because she is feeling well and stable on CPAP       has a past medical history of Allergy; Anxiety; Arthritis; Depression;  Fibromyalgia; GERD (gastroesophageal reflux disease); Hyperlipidemia; Hypertension; IBS (irritable bowel syndrome); and Sleep apnea.   reports that she has quit smoking. She has never used smokeless tobacco.  Past Surgical History:  Procedure Laterality Date  . CHOLECYSTECTOMY    . COLONOSCOPY    . PARTIAL HYSTERECTOMY  1999  . POLYPECTOMY      Allergies  Allergen Reactions  . Prednisone     pancreatitis  . Macrobid [Nitrofurantoin Monohyd Macro] Diarrhea    Diarrhea, GI upset  . Sulfa Antibiotics Rash     Immunization History  Administered Date(s) Administered  . Influenza,inj,Quad PF,36+ Mos 06/16/2013  . Influenza-Unspecified 09/16/2014, 11/19/2015  . Tdap 10/28/2005, 11/24/2015    Family History  Problem Relation Age of Onset  . Diabetes Mother   . Heart disease Mother   . Anxiety disorder Mother   . Depression Mother   . Kidney disease Mother   . Diabetes Father   . Heart disease Father   . Anxiety disorder Father   . Depression Father   . Diabetes Paternal Grandmother   . Cystic fibrosis Son   . Alcohol abuse Son      Current Outpatient Prescriptions:  .  ALPRAZolam (XANAX) 1 MG tablet, 0.5 mg in the am and 39m at night, Disp: , Rfl:  .  amLODipine (NORVASC) 5 MG tablet, Take 1 tablet by mouth at  bedtime, Disp: 90 tablet, Rfl: 1 .  butalbital-aspirin-caffeine-codeine (FIORINAL WITH CODEINE) 50-325-40-30 MG capsule, TAKE 1 CAPSULE BY MOUTH EVERY 6 HOURS AS NEEDED FOR PAIN, Disp: 30 capsule, Rfl: 0 .  cetirizine (ZYRTEC) 10 MG tablet, Take 10 mg by mouth daily., Disp: , Rfl:  .  FLUoxetine (PROZAC) 10 MG capsule, Take 40 mg by mouth daily. , Disp: , Rfl:  .  fluticasone (FLONASE) 50 MCG/ACT nasal spray, Use 2 sprays in each  nostril daily, Disp: 48 g, Rfl: 1 .  lamoTRIgine (LAMICTAL) 100 MG tablet, Take 100 mg by mouth daily., Disp: , Rfl:  .  lisinopril (PRINIVIL,ZESTRIL) 20 MG tablet, Take 1 tablet by mouth two  times daily, Disp: 180 tablet, Rfl: 1 .  PREMARIN 0.625 MG tablet, Take 1 tablet by mouth  (0.6235m daily, Disp: 90 tablet, Rfl: 1 .  promethazine (PHENERGAN) 25 MG tablet, Take 1 tablet (25 mg total) by mouth every 8 (eight) hours as needed for nausea or vomiting., Disp: 45 tablet, Rfl: 0 .  ranitidine (ZANTAC) 150 MG tablet, Take 1 tablet (150 mg total) by mouth 2 (two) times daily., Disp: 180 tablet, Rfl: 1 .  temazepam (RESTORIL) 30 MG capsule, Take 30 mg by mouth at bedtime., Disp: , Rfl:     Review of Systems     Objective:   Physical  Exam  Vitals:   05/31/16 1005  BP: 138/76  Pulse: 84  SpO2: 97%  Weight: 245 lb (111.1 kg)  Height: _0  (1.626 m)   Obese Crackles at base  Otherwise discussion only visit     Assessment:       ICD-9-CM ICD-10-CM   1. Incidental lung nodule, > 64m52mnd < 8mm64m3.11 R91.1   2. Atherosclerosis of native coronary artery without angina pectoris, unspecified whether native or transplanted heart 414.01 I25.10   3. Chronic cough 786.2 R05   4. Bibasilar crackles 786.7 R09.89   5. Dyspnea 786.09 R06.00        Plan:     Incidental lung nodule, > 64mm 27m < 8mm -46msolved on scan July 2017  Atherosclerosis of native coronary artery without angina pectori - keep up aptt with Dr Marlou Porch Nov 2017,   Chronic cough Bibasilar crackles Dyspnea  - CT chest July 2017 and PFT sept 2017 suggests presence of Interstitial Lung Disease (ILD) at mild level  - there are many varieties of ILD - each with varying causes, outlook and treatment  - To sort these out star with blood work   - Serum: ESR, ACE, ANA, DS-DNA, RF, anti-CCP, ssA, ssB, scl-70,  Total CK,  RNP, Aldolase,  -If tests are negative, then will opt to follow with repeat HRCT in 6 months because I think probability of progression is low in this time frame  - please note if cause not known and it persists on CT Chest at followup then surgical lung biopsy likely indicated  - you can do pulm rehab if interested - can help with shortness of breath (indication: ILD and dyspnea)  Followup - flu shot 05/31/2016 if you want  0- refer rehab if you want  - do blood work 05/31/2016; wil call with results to decide followup - keep cards appt  - cancel sleep doc appt   > 50% of this > 25 min visit spent in face to face counseling or coordination of care    Dr. Brand Males, M.D., [facility].C.P Pulmonary and Critical Care Medicine Staff Physician [place] [facility] Pager: [phone], If no answer or  between  15:00h - 7:00h: call [phone]  05/31/2016 10:37 AM

## 2016-05-31 NOTE — Patient Instructions (Addendum)
Incidental lung nodule, > 73m and < 843m- resolved on scan July 2017  Atherosclerosis of native coronary artery without angina pectori - keep up aptt with Dr SkMarlou Porchov 2017,   Chronic cough Bibasilar crackles Dyspnea  - CT chest July 2017 and PFT sept 2017 suggests presence of Interstitial Lung Disease (ILD) at mild level  - there are many varieties of ILD - each with varying causes, outlook and treatment  - To sort these out star with blood work   - Serum: ESR, ACE, ANA, DS-DNA, RF, anti-CCP, ssA, ssB, scl-70,  Total CK,  RNP, Aldolase,  -If tests are negative, then will opt to follow with repeat HRCT in 6 months because I think probability of progression is low in this time frame  - please note if cause not known and it persists on CT Chest at followup then surgical lung biopsy likely indicated  - you can do pulm rehab if interested - can help with shortness of breath (indication: ILD and dyspnea)  Followup - flu shot 05/31/2016 if you want  0- refer rehab if you want  - do blood work 05/31/2016; wil call with results to decide followup - keep cards appt  - cancel sleep doc appt            At some point we will discuss again role of ace inhibitor on cough but respect your desire to continue it  Continue CPAP and nasal steroids as before  At some point we can refer you to one of our sleep docs

## 2016-06-01 LAB — RHEUMATOID FACTOR: Rhuematoid fact SerPl-aCnc: <14 IU/mL (ref <14)

## 2016-06-01 LAB — RNP ANTIBODIES: ENA RNP AB: 0.2 AI (ref 0.0–0.9)

## 2016-06-03 ENCOUNTER — Ambulatory Visit: Payer: Self-pay | Admitting: Internal Medicine

## 2016-06-03 LAB — ALDOLASE: Aldolase: 3.1 U/L (ref <=8.1)

## 2016-06-03 LAB — ANTI-DNA ANTIBODY, DOUBLE-STRANDED: ds DNA Ab: <1 IU/mL

## 2016-06-03 LAB — ANGIOTENSIN CONVERTING ENZYME

## 2016-06-03 LAB — CYCLIC CITRUL PEPTIDE ANTIBODY, IGG

## 2016-06-03 LAB — ANTI-SCLERODERMA ANTIBODY: SCLERODERMA (SCL-70) (ENA) ANTIBODY, IGG: NEGATIVE

## 2016-06-03 LAB — SJOGRENS SYNDROME-A EXTRACTABLE NUCLEAR ANTIBODY: SSA (RO) (ENA) ANTIBODY, IGG: NEGATIVE

## 2016-06-03 LAB — SJOGRENS SYNDROME-B EXTRACTABLE NUCLEAR ANTIBODY: SSB (LA) (ENA) ANTIBODY, IGG: NEGATIVE

## 2016-06-03 LAB — ANA: Anti Nuclear Antibody(ANA): NEGATIVE

## 2016-06-05 ENCOUNTER — Telehealth: Payer: Self-pay | Admitting: Internal Medicine

## 2016-06-05 DIAGNOSIS — R0989 Other specified symptoms and signs involving the circulatory and respiratory systems: Secondary | ICD-10-CM

## 2016-06-05 DIAGNOSIS — R0600 Dyspnea, unspecified: Secondary | ICD-10-CM

## 2016-06-05 NOTE — Telephone Encounter (Signed)
Stacey Alvarez  LEt Stacey Alvarez know that autoimmune is negative. PLAN: do HRCT supine and prione in 6 months and ROV with me. Also, recommend rehab for dyspnea  Thanks  Dr. Brand Males, M.D., [facility].C.P Pulmonary and Critical Care Medicine Staff Physician [place] [facility] Pager: [phone], If no answer or between  15:00h - 7:00h: call [phone]  06/05/2016 8:46 AM

## 2016-06-05 NOTE — Telephone Encounter (Signed)
Called and spoke to pt. Informed her of the results and recs per MR. Order placed. Pt verbalized understanding. Pt also states she is concerned the Effexor has caused the scaring in the lungs. Pt states she has researched the side effects and in rare cases it shows scaring in the lungs. Pt states she started on this medication in 2015 and stopped taking it 3 weeks ago. Pt does not need a response but just wanted to make MR aware.   Pt also stated she will think about the rehab and call back. Nothing further needed at this time.

## 2016-06-06 ENCOUNTER — Ambulatory Visit: Payer: Self-pay | Admitting: Family Medicine

## 2016-06-06 ENCOUNTER — Other Ambulatory Visit: Payer: Self-pay | Admitting: Orthopedic Surgery

## 2016-06-06 ENCOUNTER — Encounter: Payer: Self-pay | Admitting: Internal Medicine

## 2016-06-06 DIAGNOSIS — M25511 Pain in right shoulder: Secondary | ICD-10-CM

## 2016-06-06 NOTE — Telephone Encounter (Signed)
Called spoke with pt and made aware of recs below. She will let us know if she wants to do rehab. She will hold on feno for now. nothing further needed.

## 2016-06-06 NOTE — Telephone Encounter (Signed)
ILD - as said to her in office - blood test negative means autoimmune cause of ILD ruled out but other causes not known. ANd I want to wait till next CT bbeforfe deciding on lung bx  Dyspnea - typically regardless of ILD or other cause this can improve with rehab an that is why I recommend this but she can wait on this if she wants. In interim, only test I might not have done so far is a FeNO for asthma - I doubt she has asthma when we are suspecting ILD but because of the wheeze hx she can come in one time and just do a feno test and I can get back to her. She is uinsurd and you have to tell her it typically costs $25-50 for the test  Dr. Brand Males, M.D., [facility].C.P Pulmonary and Critical Care Medicine Staff Physician [place] [facility] Pager: [phone], If no answer or between  15:00h - 7:00h: call [phone]  06/06/2016 5:29 PM     =

## 2016-06-06 NOTE — Telephone Encounter (Signed)
My husband and I both have noticed it more. Also, I'm sorry, but when I spoke with Elese yesterday, I was not sure if I understood correctly. Since the bloodwork came back negative, is the diagnosis still ILD? Once in awhile with the shortness of breath I have a sliight wheeze.   Thank you for your help.  ----- Message -----  From: CMA Mindy S S  Sent: 06/06/2016 4:16 PM EDT  To: Stacey Alvarez  Subject: RE: Non-Urgent Medical Question  Is your shortness of breath any worse from when you saw Dr. Chase Caller? Are you having any other symptoms?     ----- Message -----   From: Stacey Alvarez   Sent: 06/06/2016 12:31 PM EDT    To: Brand Males, MD  Subject: Non-Urgent Medical Question    Is there anything i can do to help shortness of breath?     Please advise MR thanks

## 2016-06-12 ENCOUNTER — Ambulatory Visit (INDEPENDENT_AMBULATORY_CARE_PROVIDER_SITE_OTHER): Payer: 59 | Admitting: Family Medicine

## 2016-06-12 ENCOUNTER — Encounter: Payer: Self-pay | Admitting: Family Medicine

## 2016-06-12 DIAGNOSIS — I10 Essential (primary) hypertension: Secondary | ICD-10-CM

## 2016-06-12 DIAGNOSIS — Z23 Encounter for immunization: Secondary | ICD-10-CM

## 2016-06-12 DIAGNOSIS — E785 Hyperlipidemia, unspecified: Secondary | ICD-10-CM

## 2016-06-12 LAB — CBC WITH DIFFERENTIAL/PLATELET
BASOS PCT: 0.5 % (ref 0.0–3.0)
Basophils Absolute: 0 10*3/uL (ref 0.0–0.1)
EOS PCT: 2.4 % (ref 0.0–5.0)
Eosinophils Absolute: 0.2 10*3/uL (ref 0.0–0.7)
HCT: 37 % (ref 36.0–46.0)
Hemoglobin: 12.5 g/dL (ref 12.0–15.0)
LYMPHS ABS: 1.6 10*3/uL (ref 0.7–4.0)
Lymphocytes Relative: 22.1 % (ref 12.0–46.0)
MCHC: 33.7 g/dL (ref 30.0–36.0)
MCV: 83.2 fl (ref 78.0–100.0)
MONO ABS: 0.4 10*3/uL (ref 0.1–1.0)
Monocytes Relative: 5.3 % (ref 3.0–12.0)
NEUTROS ABS: 5.1 10*3/uL (ref 1.4–7.7)
NEUTROS PCT: 69.7 % (ref 43.0–77.0)
Platelets: 304 10*3/uL (ref 150.0–400.0)
RBC: 4.45 Mil/uL (ref 3.87–5.11)
RDW: 14.1 % (ref 11.5–15.5)
WBC: 7.3 10*3/uL (ref 4.0–10.5)

## 2016-06-12 LAB — BASIC METABOLIC PANEL
BUN: 13 mg/dL (ref 6–23)
CHLORIDE: 100 meq/L (ref 96–112)
CO2: 29 mEq/L (ref 19–32)
Calcium: 8.9 mg/dL (ref 8.4–10.5)
Creatinine, Ser: 0.56 mg/dL (ref 0.40–1.20)
GFR: 116.37 mL/min (ref >60.00)
Glucose, Bld: 94 mg/dL (ref 70–99)
POTASSIUM: 4.2 meq/L (ref 3.5–5.1)
Sodium: 139 mEq/L (ref 135–145)

## 2016-06-12 LAB — LIPID PANEL
CHOL/HDL RATIO: 5
CHOLESTEROL: 241 mg/dL — AB (ref 0–200)
HDL: 52.5 mg/dL (ref >39.00)
NONHDL: 188.23
Triglycerides: 277 mg/dL — ABNORMAL HIGH (ref 0.0–149.0)
VLDL: 55.4 mg/dL — ABNORMAL HIGH (ref 0.0–40.0)

## 2016-06-12 LAB — HEPATIC FUNCTION PANEL
ALK PHOS: 51 U/L (ref 39–117)
ALT: 7 U/L (ref 0–35)
AST: 9 U/L (ref 0–37)
Albumin: 3.7 g/dL (ref 3.5–5.2)
BILIRUBIN TOTAL: 0.3 mg/dL (ref 0.2–1.2)
Bilirubin, Direct: 0.1 mg/dL (ref 0.0–0.3)
Total Protein: 6.5 g/dL (ref 6.0–8.3)

## 2016-06-12 LAB — TSH: TSH: 0.89 u[IU]/mL (ref 0.35–4.50)

## 2016-06-12 LAB — LDL CHOLESTEROL, DIRECT: LDL DIRECT: 144 mg/dL

## 2016-06-12 MED ORDER — FENOFIBRATE 160 MG PO TABS: 160.0000 mg | ORAL_TABLET | Freq: Every day | ORAL | 6 refills | Status: DC

## 2016-06-12 NOTE — Progress Notes (Signed)
   Subjective:    Patient ID: Stacey Alvarez, female    DOB: [birth year], 60 y.o.   MRN: [id number]  HPI HTN- chronic problem, on Amlodipine, Lisinopril w/ adequate control.  Denies CP, SOB above baseline, HAs, visual changes, edema.  Hyperlipidemia- chronic problem, attempting to control healthy diet.  No regular exercise.  Not currently on a statin.  Denies abd pain, N/V, myalgias.  Recent CT showed calcification in aorta.  Has been intolerant to Zocor, Crestor, Lipitor.  Resumed Fenofibrate.  Obesity- chronic problem, weight is up 3 lbs from CPE in March.  Not exercising regularly or following a particular diet.    Review of Systems For ROS see HPI     Objective:   Physical Exam  Constitutional: She is oriented to person, place, and time. She appears well-developed and well-nourished. No distress.  obese  HENT:  Head: Normocephalic and atraumatic.  Eyes: Conjunctivae and EOM are normal. Pupils are equal, round, and reactive to light.  Neck: Normal range of motion. Neck supple. No thyromegaly present.  Cardiovascular: Normal rate, regular rhythm, normal heart sounds and intact distal pulses.   No murmur heard. Pulmonary/Chest: Effort normal and breath sounds normal. No respiratory distress.  Abdominal: Soft. She exhibits no distension. There is no tenderness.  Musculoskeletal: She exhibits no edema.  Lymphadenopathy:    She has no cervical adenopathy.  Neurological: She is alert and oriented to person, place, and time.  Skin: Skin is warm and dry.  Psychiatric: She has a normal mood and affect. Her behavior is normal.  Vitals reviewed.         Assessment & Plan:

## 2016-06-12 NOTE — Progress Notes (Signed)
Pre visit review using our clinic review tool, if applicable. No additional management support is needed unless otherwise documented below in the visit note. 

## 2016-06-12 NOTE — Patient Instructions (Signed)
Schedule your complete physical in 6 months We'll notify you of your lab results and make any changes if needed Continue to work on healthy diet and regular exercise- you can do it!  And it's a great stress reliever! Call with any questions or concerns Hang in there!!!

## 2016-06-13 ENCOUNTER — Encounter: Payer: Self-pay | Admitting: Family Medicine

## 2016-06-13 ENCOUNTER — Other Ambulatory Visit: Payer: Self-pay | Admitting: General Practice

## 2016-06-13 DIAGNOSIS — E785 Hyperlipidemia, unspecified: Secondary | ICD-10-CM

## 2016-06-13 MED ORDER — PRAVASTATIN SODIUM 20 MG PO TABS: 20.0000 mg | ORAL_TABLET | Freq: Every day | ORAL | 1 refills | Status: DC

## 2016-06-14 ENCOUNTER — Ambulatory Visit
Admission: RE | Admit: 2016-06-14 | Discharge: 2016-06-14 | Disposition: A | Discharge status: Home / Self Care | Payer: 59 | Source: Ambulatory Visit | Attending: Orthopedic Surgery | Admitting: Orthopedic Surgery

## 2016-06-14 DIAGNOSIS — M25511 Pain in right shoulder: Secondary | ICD-10-CM

## 2016-06-14 NOTE — Assessment & Plan Note (Signed)
Chronic problem.  Pt has been intolerant to statins in the past.  Currently on Fenofibrate.  Has never been on Zetia.  Stressed need for healthy diet and regular exercise.  Check labs.  Start meds prn.

## 2016-06-14 NOTE — Assessment & Plan Note (Signed)
Chronic problem.  Adequate control.  Asymptomatic at this time.  Check labs.  No anticipated med changes.  Will follow. 

## 2016-06-14 NOTE — Assessment & Plan Note (Signed)
Ongoing issue for pt.  Complicated by her depression and lack of motivation.  Ironically, pt's depression is due to her son's lack of attention to his own medical conditions.  Stressed the need for her to work on [employer] and regular exercise to minimize medical complications moving forward.  Check labs to risk stratify.  Will follow.

## 2016-06-19 ENCOUNTER — Other Ambulatory Visit: Payer: Self-pay | Admitting: Family Medicine

## 2016-06-19 MED ORDER — PROMETHAZINE HCL 25 MG PO TABS: 25.0000 mg | ORAL_TABLET | Freq: Three times a day (TID) | ORAL | 0 refills | Status: DC | PRN

## 2016-06-19 NOTE — Telephone Encounter (Signed)
Last OV 06/12/16 Promethazine last filled 05/14/16

## 2016-06-20 ENCOUNTER — Institutional Professional Consult (permissible substitution): Payer: Self-pay | Admitting: Pulmonary Disease

## 2016-06-23 ENCOUNTER — Ambulatory Visit
Admission: RE | Admit: 2016-06-23 | Discharge: 2016-06-23 | Disposition: A | Discharge status: Home / Self Care | Payer: 59 | Source: Ambulatory Visit | Attending: Orthopedic Surgery | Admitting: Orthopedic Surgery

## 2016-07-01 ENCOUNTER — Ambulatory Visit (INDEPENDENT_AMBULATORY_CARE_PROVIDER_SITE_OTHER): Payer: 59 | Admitting: Cardiology

## 2016-07-01 ENCOUNTER — Encounter: Payer: Self-pay | Admitting: Cardiology

## 2016-07-01 VITALS — BP 122/86 | HR 89 | Ht 64.0 in | Wt 245.4 lb

## 2016-07-01 DIAGNOSIS — Z01810 Encounter for preprocedural cardiovascular examination: Secondary | ICD-10-CM

## 2016-07-01 NOTE — Progress Notes (Signed)
July 20 Electrophysiology Office Note   Date:  07/01/2016   ID:  Heida Foppiano, DOB [birth year], MRN [id number]  PCP:  Annye Asa, MD  Cardiologist:  Constance Haw, MD    Chief Complaint  Patient presents with  . New Patient (Initial Visit)    surgical clearance     History of Present Illness: Stacey Alvarez is a 60 y.o. female who presents today for electrophysiology evaluation.   She has a history of depression, fibromyalgia, hypertension, hyperlipidemia. She was found on high-resolution CT of the chest to have 2 vessel coronary atherosclerosis, although severity of the disease was not able to be quantified as this was a non-gated CT. She fell a couple weeks ago on her right arm while cooking and is now has a rotator cuff injury that requires surgical repair. She says that she slipped on water that was on the floor. She has no chest pain, PND, or orthopnea. She does wear CPAP for sleep apnea. She says that she does get short of breath when climbing a flight of stairs, but she feels like this is due to being out of shape. She can walk on flat ground without limitation.   Today, she denies symptoms of palpitations, chest pain, shortness of breath, orthopnea, PND, lower extremity edema, claudication, dizziness, presyncope, syncope, bleeding, or neurologic sequela. The patient is tolerating medications without difficulties and is otherwise without complaint today.    Past Medical History:  Diagnosis Date  . Allergy   . Anxiety   . Arthritis    KNEES,BACK,HIPS  . Depression   . Fibromyalgia   . GERD (gastroesophageal reflux disease)   . Hyperlipidemia   . Hypertension   . IBS (irritable bowel syndrome)   . Interstitial lung disease ([place])   . Sleep apnea    Past Surgical History:  Procedure Laterality Date  . CHOLECYSTECTOMY    . COLONOSCOPY    . PARTIAL HYSTERECTOMY  1999  . POLYPECTOMY       Current Outpatient Prescriptions  Medication Sig Dispense Refill  .  acetaminophen-codeine (TYLENOL #3) 300-30 MG tablet Take 1 tablet by mouth as needed.     . ALPRAZolam (XANAX) 1 MG tablet 0.5 mg in the am and 2mg  at night    . amLODipine (NORVASC) 5 MG tablet Take 1 tablet by mouth at  bedtime 90 tablet 1  . butalbital-aspirin-caffeine-codeine (FIORINAL WITH CODEINE) 50-325-40-30 MG capsule TAKE 1 CAPSULE BY MOUTH EVERY 6 HOURS AS NEEDED FOR PAIN 30 capsule 0  . cetirizine (ZYRTEC) 10 MG tablet Take 10 mg by mouth daily.    . fenofibrate 160 MG tablet Take 1 tablet (160 mg total) by mouth daily. 30 tablet 6  . FLUoxetine (PROZAC) 20 MG tablet Take 40 mg by mouth daily.    . fluticasone (FLONASE) 50 MCG/ACT nasal spray Use 2 sprays in each  nostril daily 48 g 1  . lamoTRIgine (LAMICTAL) 100 MG tablet Take 100 mg by mouth daily.    Marland Kitchen lisinopril (PRINIVIL,ZESTRIL) 20 MG tablet Take 1 tablet by mouth two  times daily 180 tablet 1  . pravastatin (PRAVACHOL) 20 MG tablet Take 1 tablet (20 mg total) by mouth daily. 30 tablet 1  . PREMARIN 0.625 MG tablet Take 1 tablet by mouth  (0.625mg ) daily 90 tablet 1  . promethazine (PHENERGAN) 25 MG tablet Take 1 tablet (25 mg total) by mouth every 8 (eight) hours as needed for nausea or vomiting. 45 tablet 0  . ranitidine (ZANTAC) 150 MG  tablet Take 1 tablet (150 mg total) by mouth 2 (two) times daily. 180 tablet 1  . temazepam (RESTORIL) 30 MG capsule Take 30 mg by mouth at bedtime.     No current facility-administered medications for this visit.     Allergies:   Prednisone; Macrobid [nitrofurantoin monohyd macro]; and Sulfa antibiotics   Social History:  The patient  reports that she has quit smoking. She has never used smokeless tobacco. She reports that she drinks alcohol. She reports that she does not use drugs.   Family History:  The patient's family history includes Alcohol abuse in her son; Anxiety disorder in her father and mother; Cystic fibrosis in her son; Depression in her father and mother; Diabetes in her  father, mother, and paternal grandmother; Heart disease in her father and mother; Kidney disease in her mother.    ROS:  Please see the history of present illness.   Otherwise, review of systems is positive for none.   All other systems are reviewed and negative.    PHYSICAL EXAM: VS:  Ht 5\' 4"  (1.626 m)   Wt 245 lb 6.4 oz (111.3 kg)   BMI 42.12 kg/m  , BMI Body mass index is 42.12 kg/m. GEN: Well nourished, well developed, in no acute distress  HEENT: normal  Neck: no JVD, carotid bruits, or masses Cardiac: RRR; no murmurs, rubs, or gallops,no edema  Respiratory:  clear to auscultation bilaterally, normal work of breathing GI: soft, nontender, nondistended, + BS MS: no deformity or atrophy  Skin: warm and dry Neuro:  Strength and sensation are intact Psych: euthymic mood, full affect  EKG:  EKG is ordered today. Personal review of the ekg ordered shows sinus rhythm, nonspecific ST changes, rate 89  Recent Labs: 06/12/2016: ALT 7; BUN 13; Creatinine, Ser 0.56; Hemoglobin 12.5; Platelets 304.0; Potassium 4.2; Sodium 139; TSH 0.89    Lipid Panel     Component Value Date/Time   CHOL 241 (H) 06/12/2016 1112   TRIG 277.0 (H) 06/12/2016 1112   HDL 52.50 06/12/2016 1112   CHOLHDL 5 06/12/2016 1112   VLDL 55.4 (H) 06/12/2016 1112   LDLDIRECT 144.0 06/12/2016 1112     Wt Readings from Last 3 Encounters:  07/01/16 245 lb 6.4 oz (111.3 kg)  06/12/16 245 lb 4 oz (111.2 kg)  05/31/16 245 lb (111.1 kg)      Other studies Reviewed: Additional studies/ records that were reviewed today include: CT chest  Review of the above records today demonstrates:  1. Previously noted ground-glass attenuation nodule in the lingula has resolved, indicative of an infectious/inflammatory nodule on the prior study. No new suspicious pulmonary nodules are noted. 2. Subtle findings in the lungs which may indicate interstitial lung disease such as nonspecific interstitial pneumonia (NSIP).  Repeat high-resolution chest CT is recommended in 12 months to assess for further temporal changes in the appearance of the lung parenchyma. 3. Mild air trapping, indicative of small airways disease. 4. Aortic atherosclerosis, in addition to 2 vessel coronary artery disease. Please note that although the presence of coronary artery calcium documents the presence of coronary artery disease, the severity of this disease and any potential stenosis cannot be assessed on this non-gated CT examination. Assessment for potential risk factor modification, dietary therapy or pharmacologic therapy may be warranted, if clinically indicated.   ASSESSMENT AND PLAN:  1.  Coronary artery disease: Found to have coronary atherosclerosis with calcification on her CT scan. She does not have chest pain, shortness of breath, PND,  orthopnea that would make me think that she has a significant stenosis. That being said, she does require risk factor modification. She is on pravastatin which was recently started and is planned to have a repeat lipid panel done in 6-8 weeks.  2. Hyperlipidemia: She has coronary disease her goal LDL should be less than 100 and more likely closer to 70. She was initially put on pravastatin 20 mg, as she has not tolerated Crestor, Lipitor, or Zocor. The pravastatin may not have enough lipid lowering to get her down to goal. If it does not, Neviah Braud refer her back to cardiology for possible follow-up in the lipid clinic.  3. Pre-surgical evaluation: She does have evidence of coronary disease, but has no symptoms of chest pain, and no worrisome amounts of shortness of breath. Due to that, she would be at intermediate risk for an intermediate risk procedure. Would continue her pravastatin and aspirin, if possible, throughout the procedure.  Current medicines are reviewed at length with the patient today.   The patient does not have concerns regarding her medicines.  The following changes were made  today:  none  Labs/ tests ordered today include:  No orders of the defined types were placed in this encounter.    Disposition:   FU with [facility]  Signed, Vibhav Waddill Meredith Leeds, MD  07/01/2016 11:42 AM     [facility] [address] [place] [address] [phone] (office) [phone] (fax)

## 2016-07-01 NOTE — Patient Instructions (Signed)
Medication Instructions:  Your physician recommends that you continue on your current medications as directed. Please refer to the Current Medication list given to you today.  * If you need a refill on your cardiac medications before your next appointment, please call your pharmacy.   Labwork: None ordered  Testing/Procedures: None ordered  Follow-Up: No follow up is needed at this time with Dr. Camnitz.  He will see you on an as needed basis.   Thank you for choosing [facility]!!   Daniyah Fohl, RN [phone]     

## 2016-07-04 ENCOUNTER — Other Ambulatory Visit: Payer: Self-pay | Admitting: Family Medicine

## 2016-07-12 ENCOUNTER — Encounter: Payer: Self-pay | Admitting: Family Medicine

## 2016-07-22 ENCOUNTER — Other Ambulatory Visit: Payer: Self-pay | Admitting: Family Medicine

## 2016-07-23 ENCOUNTER — Ambulatory Visit: Payer: 59 | Admitting: Cardiology

## 2016-08-06 ENCOUNTER — Encounter: Payer: Self-pay | Admitting: Family Medicine

## 2016-08-14 ENCOUNTER — Other Ambulatory Visit: Payer: Self-pay | Admitting: Family Medicine

## 2016-08-15 ENCOUNTER — Other Ambulatory Visit: Payer: Self-pay | Admitting: Family Medicine

## 2016-08-15 ENCOUNTER — Encounter: Payer: Self-pay | Admitting: Family Medicine

## 2016-08-16 ENCOUNTER — Other Ambulatory Visit: Payer: Self-pay | Admitting: Family Medicine

## 2016-08-16 MED ORDER — BUTALBITAL-ASA-CAFF-CODEINE 50-325-40-30 MG PO CAPS: ORAL_CAPSULE | ORAL | 0 refills | Status: DC

## 2016-08-16 NOTE — Addendum Note (Signed)
Addended by: Davis Gourd on: 08/16/2016 09:42 AM   Modules accepted: Orders

## 2016-08-16 NOTE — Telephone Encounter (Signed)
Med filled to local pharmacy. Rx printed as Dr. Birdie Riddle is out of the office and [facility] signed.

## 2016-08-16 NOTE — Telephone Encounter (Signed)
Last OV 06/12/16 Fiorinal last filled 05/14/16 #30 with 0  Low Risk

## 2016-08-23 ENCOUNTER — Other Ambulatory Visit: Payer: Self-pay | Admitting: Family Medicine

## 2016-08-26 ENCOUNTER — Other Ambulatory Visit: Payer: Self-pay | Admitting: General Practice

## 2016-08-26 MED ORDER — PROMETHAZINE HCL 25 MG PO TABS: 25.0000 mg | ORAL_TABLET | Freq: Three times a day (TID) | ORAL | 0 refills | Status: DC | PRN

## 2016-08-26 NOTE — Telephone Encounter (Signed)
Last OV 07/01/16 Phenergan last filled 06/19/16 #45 with 0

## 2016-08-26 NOTE — Telephone Encounter (Signed)
Medication filled to pharmacy as requested.   

## 2016-09-16 DIAGNOSIS — I2699 Other pulmonary embolism without acute cor pulmonale: Secondary | ICD-10-CM

## 2016-09-16 HISTORY — DX: Other pulmonary embolism without acute cor pulmonale: I26.99

## 2016-09-20 ENCOUNTER — Other Ambulatory Visit: Payer: Self-pay | Admitting: Family Medicine

## 2016-09-26 ENCOUNTER — Encounter: Payer: Self-pay | Admitting: Family Medicine

## 2016-09-26 NOTE — Telephone Encounter (Signed)
Called pt for more information regarding MyChart message. She states she has had a similar problem for "quite a while." She is currently experiencing increased stress r/t her rotator cuff tear, husband having a CA scare, and her son being sick. She feels like her stress might be contributing to her experiencing palpitations more often and DOE. Currently, palpitation occur 'a few times a day.' She has experienced dizziness + lightheadedness for 'about a year' now-it has worsened recently. She is able to speak in complete sentences and is alert over the phone. She denies SOB at rest, CP, syncope, diaphoresis, recent changes to meds, and increased caffeine intake. She really would like cards referral vs PCP appt. I explained to her that the referral process takes time, and given her symptoms she should be seen before the weekend. PCP fully booked and pt declined appt w/ PA-C, so pt requested appt in [facility] office. Acute appt scheduled tomorrow w/ Dr. Nani Ravens in [facility]. Discussed red flags that should prompt emergency medical care prior to scheduled appointment and pt agreed to instructions/plan.

## 2016-09-27 ENCOUNTER — Ambulatory Visit: Payer: Self-pay | Admitting: Family Medicine

## 2016-09-30 ENCOUNTER — Telehealth: Payer: Self-pay | Admitting: Family Medicine

## 2016-09-30 NOTE — Telephone Encounter (Signed)
Please schedule acute appt for flu like symptoms for pt- she may need to see Einar Pheasant or another provider based on scheduling

## 2016-09-30 NOTE — Telephone Encounter (Signed)
Pt called and stated that wife (pt) was having shortness of breath. Transferred call to [facility] immediately. Pt called back to say that they hung up on [facility] b/c they had another appointment. We transferred back to [facility] due to shortness of breath still a symptom. Pt called again to state that pt no longer has shortness of breath and they spoke to [facility] and just has flu symptoms and soreness of body and wanted to get scheduled for an ACUTE appt.   Thank you.

## 2016-09-30 NOTE — Telephone Encounter (Signed)
Pt scheduled for 1/16 @ 10:30

## 2016-10-01 ENCOUNTER — Encounter: Payer: Self-pay | Admitting: Family Medicine

## 2016-10-01 ENCOUNTER — Telehealth: Payer: Self-pay | Admitting: Internal Medicine

## 2016-10-01 ENCOUNTER — Ambulatory Visit (INDEPENDENT_AMBULATORY_CARE_PROVIDER_SITE_OTHER): Payer: BLUE CROSS/BLUE SHIELD | Admitting: Internal Medicine

## 2016-10-01 ENCOUNTER — Encounter: Payer: Self-pay | Admitting: Internal Medicine

## 2016-10-01 ENCOUNTER — Ambulatory Visit: Payer: Self-pay | Admitting: Family Medicine

## 2016-10-01 VITALS — BP 102/60 | HR 85 | Ht 64.0 in | Wt 237.0 lb

## 2016-10-01 DIAGNOSIS — J849 Interstitial pulmonary disease, unspecified: Secondary | ICD-10-CM | POA: Diagnosis not present

## 2016-10-01 DIAGNOSIS — I2510 Atherosclerotic heart disease of native coronary artery without angina pectoris: Secondary | ICD-10-CM

## 2016-10-01 DIAGNOSIS — R0600 Dyspnea, unspecified: Secondary | ICD-10-CM

## 2016-10-01 DIAGNOSIS — R0689 Other abnormalities of breathing: Secondary | ICD-10-CM

## 2016-10-01 MED ORDER — DOXYCYCLINE HYCLATE 100 MG PO TABS: ORAL_TABLET | ORAL | 0 refills | Status: DC

## 2016-10-01 NOTE — Patient Instructions (Addendum)
ICD-9-CM ICD-10-CM   1. ILD (interstitial lung disease) ([place]) 515 J84.9   2. Dyspnea and respiratory abnormality 786.09 R06.00     R06.89   3. Atherosclerosis of native coronary artery without angina pectoris, unspecified whether native or transplanted heart 414.01 I25.10    Concern is that your ILD is worse This is more likely than a heart problem  Possible a recent viral infection flared up your ILD  PLAN  - hold off prednisone due to pancreatitis side effect  - Take doxycycline 100mg  po twice daily x 5 days; take after meals and avoid sunlight - Do HRCT supine and prone [facility]  -Do Pre-bd spiro and dlco only. No lung volume or bd response. No post-bd spiro - this week  - Qualify walk test for oxygen - start 2L [place] With exertion  Followup - my office will see if we can get earlier appt at cardiology - if not refer to Dr Einar Gip  - return to see me this week - prefer early afternoon Friday 10/03/16 or morning of 10/07/16 10am but after completing tests - at followup depneding on CT scna profile will need to discuss lung biopsy

## 2016-10-01 NOTE — Telephone Encounter (Signed)
Scheduled pt to see Dr. Marlou Porch tomorrow, 10/02/16 at [place] pt we are opening at [place] tomorrow due to possible weather and to call before coming to make sure we didn't close.  Advised I will send message to Dr. Marlou Porch nurse in the event that we do close so that we can see about rescheduling.    Kept appt with Bonney Leitz, PA-C on 10/10/16 in the event of office closing tomorrow and no other openings being available.  Advised pt we could cancel this appt once we are sure she can get a sooner appt.  Pt appreciative for assistance.

## 2016-10-01 NOTE — Telephone Encounter (Signed)
Hi Stacey Alvarez  I am really concerned Stacey Alvarez ILD has rapidly worsened. She has co art calcification and her nov 2017 appt got postpoined to feb 2018. Anwyay you or someone can work her in sooner. She needs lung bx most likely and will need to ensure coronaries can handle this  THanks  Dr. Brand Males, M.D., [facility].C.P Pulmonary and Critical Care Medicine Staff Physician [place] [facility] Pager: [phone], If no answer or between  15:00h - 7:00h: call [phone]  10/01/2016 12:55 PM

## 2016-10-01 NOTE — Telephone Encounter (Signed)
Let's try to work her in for evaluation Stacey Furbish, MD

## 2016-10-01 NOTE — Progress Notes (Signed)
Subjective:     Patient ID: Stacey Alvarez, female   DOB: 1955/11/29, 61 y.o.   MRN: [id number] PCP Annye Asa, MD  HPI    HPI  IOV 04/05/2016  Chief Complaint  Patient presents with  . Advice Only    refer Dr. Birdie Riddle for lung nodule.    61 year old female presents with her husband for evaluation of pulmonary nodule she also has cough and on ACE inhibitors. She has a history of sleep apnea  -In terms of lung nodule: She's had some left infrascapular pain and left upper quadrant pain. According to her history. She then underwent a CT scan renal stone study 03/21/2016 that showed a 7 mm irregular solid lung nodule in the left upper lung area. This is new since 2015. She is extremely worried that this might be lung cancer because she has had friends die from it particularly one person was told that her nodule to be watched but a year related ended up being terminal lung cancer and the patient died. Patient herself hardly ever smoke. She is not having any weight loss, hemoptysis, shortness of breath. Because of the irregular shape of the nodule she did see me worried that this is indeed lung cancer.  -Other problem cough: She has chronic cough which he says is due to postnasal drip and happens when she uses her CPAP regularly at night. She does use nasal steroid. She is on ACE inhibitor. She is aware that ACE inhibitor scan cause cough but she does not think that it is the ACE inhibitor causing the cough although she describes throat irritation and other symptoms that can be consistent with ACE inhibitor cough. She does not want stop her ACE inhibitor because it really works well for blood pressure she has problems tolerating medications in general. RSI cough score is 9 and details of the quality of cough is documented below. There are no clear cut aggravating or relieving factors.  off note she does not have diagnosis of asthma pulmonary fibrosis or dyspnea. Exam did show bilateral bibasal  crackles with a CT scan of the abdomen which was not high-resolution did not clearly show pulmonary fibrosis     Sleep apnea: She was diagnosed with sleep apnea few to several years ago. She is on nocturnal CPAP. She is to have a sleep Dr. Viona [facility] but she is now relocated to [place] where she is originally from. She does not have a sleep doc at some point she would want to sleep doc.    OV  05/31/2016  Chief Complaint  Patient presents with  . Follow-up    review PFT.  pt c/o sob with exertion, occasional nonprod cough.       Ct Chest High Resolution  Result Date: 04/10/2016 CLINICAL DATA:  61 year old female with history of pulmonary nodule noted on prior CT the abdomen and pelvis. Followup study. EXAM: CT CHEST WITHOUT CONTRAST TECHNIQUE: Multidetector CT imaging of the chest was performed following the standard protocol without intravenous contrast. High resolution imaging of the lungs, as well as inspiratory and expiratory imaging, was performed. COMPARISON:  No prior chest CT. CT the abdomen and pelvis 03/21/2016. FINDINGS: Cardiovascular: Heart size is normal. There is no significant pericardial fluid, thickening or pericardial calcification. There is aortic atherosclerosis, as well as atherosclerosis of the great vessels of the mediastinum and the coronary arteries, including calcified atherosclerotic plaque in the left anterior descending and right coronary arteries. Mediastinum/Nodes: No pathologically enlarged mediastinal or hilar lymph  nodes. Please note that accurate exclusion of hilar adenopathy is limited on noncontrast CT scans. Esophagus is unremarkable in appearance. No axillary lymphadenopathy. Lungs/Pleura: The previously noted ground-glass attenuation nodule in the periphery of the lingula is no longer identified, presumably infectious or inflammatory on the prior study. No other suspicious appearing pulmonary nodules or masses are noted on today's  examination. High-resolution images demonstrate some patchy areas of ground-glass attenuation and septal thickening, most evident in the medial aspect of the right lower lobe, basal segments of the left lower lobe, and medial segment of the right middle lobe where there is some associated thickening of the peribronchovascular interstitium, mild cylindrical traction bronchiectasis and some peripheral bronchiolectasis. No frank honeycombing is identified. Inspiratory and expiratory imaging demonstrates some mild air trapping, indicative of small airways disease. No acute consolidative airspace disease. No pleural effusions. Upper Abdomen: Status post cholecystectomy.  Aortic atherosclerosis. Musculoskeletal: There are no aggressive appearing lytic or blastic lesions noted in the visualized portions of the skeleton. IMPRESSION: 1. Previously noted ground-glass attenuation nodule in the lingula has resolved, indicative of an infectious/inflammatory nodule on the prior study. No new suspicious pulmonary nodules are noted. 2. Subtle findings in the lungs which may indicate interstitial lung disease such as nonspecific interstitial pneumonia (NSIP). Repeat high-resolution chest CT is recommended in 12 months to assess for further temporal changes in the appearance of the lung parenchyma. 3. Mild air trapping, indicative of small airways disease. 4. Aortic atherosclerosis, in addition to 2 vessel coronary artery disease. Please note that although the presence of coronary artery calcium documents the presence of coronary artery disease, the severity of this disease and any potential stenosis cannot be assessed on this non-gated CT examination. Assessment for potential risk factor modification, dietary therapy or pharmacologic therapy may be warranted, if clinically indicated. 5. Additional incidental findings, as above. Electronically Signed   By: Vinnie Langton M.D.   On: 04/10/2016 16:11   ebbie Jian discussed with  her and husband  A) nodule seen on CT   abd left lingula area has resolved - so this is not cancer  B) no new nodules  C) possible ILD present to explain her crackles- she had pulmonary function test 05/30/2016 that showed FVC 1.92 L/57%, FEV1 1.7 L/66% with a ratio of 89 suggestive of restriction. There was only 9% bronchodilator response. Total lung capacity 4.92 L/94%. DLCO 15.6/60% and reduced in all suggestive of intrinsic parenchymal lung disease with restriction. Likely ILD. Walking desaturation test 185 feet 3 laps and rheumatic: She became short of breath the second lap did not have chest pain did not desaturate.   D) co art atherosclerosis + - proably related to agingl has appt with cards   E) the want to cancel sleep Dr. appointment. Because she is feeling well and stable on CPAP   [place] OV 10/01/2016  Chief Complaint  Patient presents with  . Acute Visit    increased SOB x2 months, drastically worsening x1 week.  sats were 84% RA upon entering exam room.  denies any tightness, wheezing, increased cough   Follow-up dyspnea cough with crackles interstitial lung disease  Last visit was in July 2017. CT scan high resolution at that time showed possible interstitial lung disease as interpreted by thoracic radiologist. Are function tests showed restriction with low DLCO. I had a discussion about surgical lung biopsy because of negative autoimmune profile. She and her husband remember that. She was reluctant to have lung biopsy. She opted for follow-up. A follow-up  CT chest was recommended in 6 months time. However she is here acutely because in the last 3 weeks or so according to her and her husband she's had declined and shortness of breath. She is now dyspneic walking short distances. In our office 185 feet 3 laps on room air: She only walk one lap became dyspneic and desaturated to 84%. This is a new finding for her. She did not have this at the last visit in July 2017. It is noted  in November 2017 she did have shoulder surgery and was under general anesthesia and she tolerated the surgery fine. However the decline is only in the last few weeks. This no viral syndrome which she is worried about virus flare up. I mentioned surgical lung biopsy as a way to understand her interstitial lung disease and also mention that interstitial lung disease is contributing to symptoms most likely: She is still very reluctant to have surgical lung biopsy.  Of note she had a cardiology appointment in November 2017 but this is been postponed to February 2018 and so far she has not had seen the cardiologist for coronary artery calcification this because of intervening shoulder surgery in November 2017.    has a past medical history of Allergy; Anxiety; Arthritis; Depression; Fibromyalgia; GERD (gastroesophageal reflux disease); Hyperlipidemia; Hypertension; IBS (irritable bowel syndrome); Interstitial lung disease ([place]); and Sleep apnea.   reports that she has quit smoking. She has never used smokeless tobacco.  Past Surgical History:  Procedure Laterality Date  . CHOLECYSTECTOMY    . COLONOSCOPY    . PARTIAL HYSTERECTOMY  1999  . POLYPECTOMY      Allergies  Allergen Reactions  . Prednisone     pancreatitis  . Macrobid [Nitrofurantoin Monohyd Macro] Diarrhea    Diarrhea, GI upset  . Sulfa Antibiotics Rash    Immunization History  Administered Date(s) Administered  . Influenza,inj,Quad PF,36+ Mos 06/16/2013, 06/12/2016  . Influenza-Unspecified 09/16/2014, 11/19/2015  . Tdap 10/28/2005, 11/24/2015    Family History  Problem Relation Age of Onset  . Diabetes Mother   . Heart disease Mother   . Anxiety disorder Mother   . Depression Mother   . Kidney disease Mother   . Diabetes Father   . Heart disease Father   . Anxiety disorder Father   . Depression Father   . Diabetes Paternal Grandmother   . Cystic fibrosis Son   . Alcohol abuse Son      Current Outpatient  Prescriptions:  .  acetaminophen-codeine (TYLENOL #3) 300-30 MG tablet, Take 1 tablet by mouth as needed. , Disp: , Rfl:  .  ALPRAZolam (XANAX) 1 MG tablet, 0.5 mg in the am and 2mg  at night, Disp: , Rfl:  .  amLODipine (NORVASC) 5 MG tablet, Take 1 tablet by mouth at  bedtime, Disp: 90 tablet, Rfl: 1 .  butalbital-aspirin-caffeine-codeine (FIORINAL WITH CODEINE) 50-325-40-30 MG capsule, TAKE 1 CAPSULE BY MOUTH EVERY 6 HOURS AS NEEDED FOR PAIN, Disp: 30 capsule, Rfl: 0 .  cetirizine (ZYRTEC) 10 MG tablet, Take 10 mg by mouth daily., Disp: , Rfl:  .  fenofibrate 160 MG tablet, TAKE 1 TABLET BY MOUTH  DAILY, Disp: 90 tablet, Rfl: 1 .  FLUoxetine (PROZAC) 20 MG tablet, Take 40 mg by mouth daily., Disp: , Rfl:  .  fluticasone (FLONASE) 50 MCG/ACT nasal spray, Use 2 sprays in each  nostril daily, Disp: 48 g, Rfl: 1 .  HYDROcodone-acetaminophen (NORCO) 10-325 MG tablet, Take 1 tablet by mouth every 6 (six)  hours as needed., Disp: , Rfl:  .  lamoTRIgine (LAMICTAL) 100 MG tablet, Take 100 mg by mouth daily., Disp: , Rfl:  .  lisinopril (PRINIVIL,ZESTRIL) 20 MG tablet, TAKE 1 TABLET BY MOUTH TWO  TIMES DAILY, Disp: 180 tablet, Rfl: 0 .  methocarbamol (ROBAXIN) 500 MG tablet, Take 500 mg by mouth 2 (two) times daily., Disp: , Rfl:  .  pravastatin (PRAVACHOL) 20 MG tablet, TAKE 1 TABLET(20 MG) BY MOUTH DAILY, Disp: 90 tablet, Rfl: 1 .  PREMARIN 0.625 MG tablet, TAKE 1 TABLET BY MOUTH  (0.625MG ) DAILY, Disp: 90 tablet, Rfl: 1 .  promethazine (PHENERGAN) 25 MG tablet, Take 1 tablet (25 mg total) by mouth every 8 (eight) hours as needed for nausea or vomiting., Disp: 45 tablet, Rfl: 0 .  ranitidine (ZANTAC) 150 MG tablet, Take 1 tablet (150 mg total) by mouth 2 (two) times daily., Disp: 180 tablet, Rfl: 1 .  temazepam (RESTORIL) 30 MG capsule, Take 30 mg by mouth at bedtime., Disp: , Rfl:    Review of Systems     Objective:   Physical Exam  Constitutional: She is oriented to person, place, and time. She  appears well-developed and well-nourished. No distress.  HENT:  Head: Normocephalic and atraumatic.  Right Ear: External ear normal.  Left Ear: External ear normal.  Mouth/Throat: Oropharynx is clear and moist. No oropharyngeal exudate.  Eyes: Conjunctivae and EOM are normal. Pupils are equal, round, and reactive to light. Right eye exhibits no discharge. Left eye exhibits no discharge. No scleral icterus.  Neck: Normal range of motion. Neck supple. No JVD present. No tracheal deviation present. No thyromegaly present.  Cardiovascular: Normal rate, regular rhythm, normal heart sounds and intact distal pulses.  Exam reveals no gallop and no friction rub.   No murmur heard. Pulmonary/Chest: Effort normal. No respiratory distress. She has no wheezes. She has rales. She exhibits no tenderness.  Abdominal: Soft. Bowel sounds are normal. She exhibits no distension and no mass. There is no tenderness. There is no rebound and no guarding.  Musculoskeletal: Normal range of motion. She exhibits no edema or tenderness.  Lymphadenopathy:    She has no cervical adenopathy.  Neurological: She is alert and oriented to person, place, and time. She has normal reflexes. No cranial nerve deficit. She exhibits normal muscle tone. Coordination normal.  Skin: Skin is warm and dry. No rash noted. She is not diaphoretic. No erythema. No pallor.  Psychiatric: She has a normal mood and affect. Her behavior is normal. Judgment and thought content normal.  Anxious and worried  Vitals reviewed.   Vitals:   10/01/16 1023  BP: 102/60  Pulse: 85  SpO2: 95%  Weight: 237 lb (107.5 kg)  Height: 5\' 4"  (1.626 m)           Assessment:       ICD-9-CM ICD-10-CM   1. ILD (interstitial lung disease) (HCC) 515 J84.9 doxycycline (VIBRA-TABS) 100 MG tablet     CT CHEST HIGH RESOLUTION     Pulmonary Function Test     Ambulatory Referral for DME  2. Dyspnea and respiratory abnormality 786.09 R06.00     R06.89   3.  Atherosclerosis of native coronary artery without angina pectoris, unspecified whether native or transplanted heart 414.01 I25.10    Very concerning for worsening interstitial lung disease. As a few months ago her interstitial lung disease did not fit the IPF pattern because of a gender, age less than 52 and also CT findings not correlating  with definite IPF. She was reluctant to have surgical lung biopsy. Right now it appears clinically that she has progressive interstitial lung disease especially in the last few weeks. Unclear why this is sudden for progression possible viral flare but this is not certain. She is intolerant to prednisone because she gets pancreatic Nilda Calamity each time. She wants to try antibiotics     Plan:      The differential diagnosis of interstitial lung disease is broard with different prognosis and treatment options. I briefly discussed about IPF and the fact that this disease  is beginning to look like it is some progression I explained this This to her and her husband. At this point in time start oxygen therapy. Repeat Pulm function test and high resolution CT chest supine and prone images. Hopefully can get this done in the next few days and I will see her for follow-up. If the CT chest is not definitive for IPF then we'll have a serious discussion about surgical lung biopsy. She is still reluctant to have surgical lung biopsy     High Concern is that your ILD is worse This is more likely than a heart problem  Possible a recent viral infection flared up your ILD  PLAN  - hold off prednisone due to pancreatitis side effect  - Take doxycycline 100mg  po twice daily x 5 days; take after meals and avoid sunlight - Do HRCT supine and prone this week at [facility]  -Do Pre-bd spiro and dlco only. No lung volume or bd response. No post-bd spiro - this week  - Qualify walk test for oxygen - start 2L [place] With exertion  Followup - my office will see if we can get earlier appt at  cardiology - if not refer to Dr Einar Gip  - return to see me this week - prefer early afternoon Friday 10/03/16 or morning of 10/07/16 10am but after completing tests - at followup depneding on CT scna profile will need to discuss lung biopsy    > 50% of this > 25 min visit spent in face to face counseling or coordination of care   .Dr. Brand Males, M.D., [facility].C.P Pulmonary and Critical Care Medicine Staff Physician [place] [facility] Pager: [phone], If no answer or between  15:00h - 7:00h: call [phone]  10/01/2016 11:04 AM

## 2016-10-02 ENCOUNTER — Ambulatory Visit: Payer: BLUE CROSS/BLUE SHIELD | Admitting: Cardiology

## 2016-10-03 ENCOUNTER — Ambulatory Visit (HOSPITAL_BASED_OUTPATIENT_CLINIC_OR_DEPARTMENT_OTHER): Admission: RE | Admit: 2016-10-03 | Payer: BLUE CROSS/BLUE SHIELD | Source: Ambulatory Visit

## 2016-10-03 ENCOUNTER — Ambulatory Visit (HOSPITAL_BASED_OUTPATIENT_CLINIC_OR_DEPARTMENT_OTHER)
Admission: RE | Admit: 2016-10-03 | Discharge: 2016-10-03 | Disposition: A | Discharge status: Home / Self Care | Payer: BLUE CROSS/BLUE SHIELD | Source: Ambulatory Visit | Attending: Internal Medicine | Admitting: Internal Medicine

## 2016-10-03 DIAGNOSIS — J849 Interstitial pulmonary disease, unspecified: Secondary | ICD-10-CM | POA: Diagnosis not present

## 2016-10-03 DIAGNOSIS — J479 Bronchiectasis, uncomplicated: Secondary | ICD-10-CM | POA: Diagnosis not present

## 2016-10-03 DIAGNOSIS — I2510 Atherosclerotic heart disease of native coronary artery without angina pectoris: Secondary | ICD-10-CM | POA: Insufficient documentation

## 2016-10-03 DIAGNOSIS — J984 Other disorders of lung: Secondary | ICD-10-CM | POA: Diagnosis not present

## 2016-10-03 DIAGNOSIS — I700 Atherosclerosis of aorta: Secondary | ICD-10-CM | POA: Diagnosis not present

## 2016-10-04 ENCOUNTER — Telehealth: Payer: Self-pay | Admitting: Internal Medicine

## 2016-10-04 ENCOUNTER — Encounter: Payer: Self-pay | Admitting: Internal Medicine

## 2016-10-04 ENCOUNTER — Ambulatory Visit (INDEPENDENT_AMBULATORY_CARE_PROVIDER_SITE_OTHER): Payer: BLUE CROSS/BLUE SHIELD | Admitting: Internal Medicine

## 2016-10-04 VITALS — BP 140/82 | HR 93 | Temp 98.3°F

## 2016-10-04 DIAGNOSIS — G4733 Obstructive sleep apnea (adult) (pediatric): Secondary | ICD-10-CM | POA: Diagnosis not present

## 2016-10-04 DIAGNOSIS — J849 Interstitial pulmonary disease, unspecified: Secondary | ICD-10-CM

## 2016-10-04 DIAGNOSIS — R0600 Dyspnea, unspecified: Secondary | ICD-10-CM

## 2016-10-04 LAB — PULMONARY FUNCTION TEST
FEF 25-75 PRE: 1.6 L/s
FEF2575-%PRED-PRE: 70 %
FEV1-%Pred-Pre: 46 %
FEV1-Pre: 1.21 L
FEV1FVC-%PRED-PRE: 111 %
FEV6-%Pred-Pre: 43 %
FEV6-Pre: 1.4 L
FEV6FVC-%PRED-PRE: 104 %
FVC-%Pred-Pre: 41 %
FVC-Pre: 1.4 L
PRE FEV1/FVC RATIO: 86 %
PRE FEV6/FVC RATIO: 100 %

## 2016-10-04 NOTE — Telephone Encounter (Signed)
She has progressive ILD. She needs to show up 10/04/2016 for office visit. Just fyi  Dr. Brand Males, M.D., [facility].C.P Pulmonary and Critical Care Medicine Staff Physician [place] [facility] Pager: [phone], If no answer or between  15:00h - 7:00h: call [phone]  10/04/2016 9:23 AM

## 2016-10-04 NOTE — Telephone Encounter (Signed)
No need for PET scan  Regarding bx: fully understand the concerns. 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 Brand Males, M.D., [facility].C.P Pulmonary and Critical Care Medicine Staff Physician [place] [facility] Pager: [phone], If no answer or between  15:00h - 7:00h: call [phone]  10/04/2016 7:15 PM

## 2016-10-04 NOTE — Telephone Encounter (Signed)
Pt is scheduled for PFT and OV today. She is currently arrived in the system for both appointments. Message will be closed.

## 2016-10-04 NOTE — Telephone Encounter (Signed)
Pt has been scheduled to see Truitt Merle, NP 10/08/16  She is aware.

## 2016-10-04 NOTE — Telephone Encounter (Signed)
Spoke with pt's husband. She is wanting to know if MR thinks it would beneficial for her to have a PET scan. Pt is apprehensive about having a biopsy done.  MR - please advise. Thanks.

## 2016-10-04 NOTE — Patient Instructions (Addendum)
ICD-9-CM ICD-10-CM   1. ILD (interstitial lung disease) ([place]) 515 J84.9   2. Dyspnea, unspecified type 786.09 R06.00     Progressive Specific variety unknown but hypersensitivity pneumonitis a possibility  Plan - do ACCP ILD questioin - repeat walk test  - use 2L O2 with exertion - get rid of feathered pillow of your husband  - refer Dr Roxan Hockey for discussio on surgical lung biopsy  - to be seen week of 10/07/16 - if after meeting Dr Roxan Hockey if you decide against biopsy, we can do empiric prednisone  Followup 3-4 weeks with me

## 2016-10-04 NOTE — Progress Notes (Signed)
Subjective:     Patient ID: Stacey Alvarez, female   DOB: [birth year], 61 y.o.   MRN: [id number]  HPI   HPI  IOV 04/05/2016  Chief Complaint  Patient presents with  . Advice Only    refer Dr. Birdie Riddle for lung nodule.    61 year old female presents with her husband for evaluation of pulmonary nodule she also has cough and on ACE inhibitors. She has a history of sleep apnea  -In terms of lung nodule: She's had some left infrascapular pain and left upper quadrant pain. According to her history. She then underwent a CT scan renal stone study 03/21/2016 that showed a 7 mm irregular solid lung nodule in the left upper lung area. This is new since 2015. She is extremely worried that this might be lung cancer because she has had friends die from it particularly one person was told that her nodule to be watched but a year related ended up being terminal lung cancer and the patient died. Patient herself hardly ever smoke. She is not having any weight loss, hemoptysis, shortness of breath. Because of the irregular shape of the nodule she did see me worried that this is indeed lung cancer.  -Other problem cough: She has chronic cough which he says is due to postnasal drip and happens when she uses her CPAP regularly at night. She does use nasal steroid. She is on ACE inhibitor. She is aware that ACE inhibitor scan cause cough but she does not think that it is the ACE inhibitor causing the cough although she describes throat irritation and other symptoms that can be consistent with ACE inhibitor cough. She does not want stop her ACE inhibitor because it really works well for blood pressure she has problems tolerating medications in general. RSI cough score is 9 and details of the quality of cough is documented below. There are no clear cut aggravating or relieving factors.  off note she does not have diagnosis of asthma pulmonary fibrosis or dyspnea. Exam did show bilateral bibasal crackles with a CT scan of the  abdomen which was not high-resolution did not clearly show pulmonary fibrosis     Sleep apnea: She was diagnosed with sleep apnea few to several years ago. She is on nocturnal CPAP. She is to have a sleep Dr. Viona Gilmore [facility] but she is now relocated to [place] where she is originally from. She does not have a sleep doc at some point she would want to sleep doc.    OV  05/31/2016  Chief Complaint  Patient presents with  . Follow-up    review PFT.  pt c/o sob with exertion, occasional nonprod cough.       Ct Chest High Resolution  Result Date: 04/10/2016 CLINICAL DATA:  61 year old female with history of pulmonary nodule noted on prior CT the abdomen and pelvis. Followup study. EXAM: CT CHEST WITHOUT CONTRAST TECHNIQUE: Multidetector CT imaging of the chest was performed following the standard protocol without intravenous contrast. High resolution imaging of the lungs, as well as inspiratory and expiratory imaging, was performed. COMPARISON:  No prior chest CT. CT the abdomen and pelvis 03/21/2016. FINDINGS: Cardiovascular: Heart size is normal. There is no significant pericardial fluid, thickening or pericardial calcification. There is aortic atherosclerosis, as well as atherosclerosis of the great vessels of the mediastinum and the coronary arteries, including calcified atherosclerotic plaque in the left anterior descending and right coronary arteries. Mediastinum/Nodes: No pathologically enlarged mediastinal or hilar lymph nodes. Please note that accurate  exclusion of hilar adenopathy is limited on noncontrast CT scans. Esophagus is unremarkable in appearance. No axillary lymphadenopathy. Lungs/Pleura: The previously noted ground-glass attenuation nodule in the periphery of the lingula is no longer identified, presumably infectious or inflammatory on the prior study. No other suspicious appearing pulmonary nodules or masses are noted on today's examination. High-resolution images  demonstrate some patchy areas of ground-glass attenuation and septal thickening, most evident in the medial aspect of the right lower lobe, basal segments of the left lower lobe, and medial segment of the right middle lobe where there is some associated thickening of the peribronchovascular interstitium, mild cylindrical traction bronchiectasis and some peripheral bronchiolectasis. No frank honeycombing is identified. Inspiratory and expiratory imaging demonstrates some mild air trapping, indicative of small airways disease. No acute consolidative airspace disease. No pleural effusions. Upper Abdomen: Status post cholecystectomy.  Aortic atherosclerosis. Musculoskeletal: There are no aggressive appearing lytic or blastic lesions noted in the visualized portions of the skeleton. IMPRESSION: 1. Previously noted ground-glass attenuation nodule in the lingula has resolved, indicative of an infectious/inflammatory nodule on the prior study. No new suspicious pulmonary nodules are noted. 2. Subtle findings in the lungs which may indicate interstitial lung disease such as nonspecific interstitial pneumonia (NSIP). Repeat high-resolution chest CT is recommended in 12 months to assess for further temporal changes in the appearance of the lung parenchyma. 3. Mild air trapping, indicative of small airways disease. 4. Aortic atherosclerosis, in addition to 2 vessel coronary artery disease. Please note that although the presence of coronary artery calcium documents the presence of coronary artery disease, the severity of this disease and any potential stenosis cannot be assessed on this non-gated CT examination. Assessment for potential risk factor modification, dietary therapy or pharmacologic therapy may be warranted, if clinically indicated. 5. Additional incidental findings, as above. Electronically Signed   By: Vinnie Langton M.D.   On: 04/10/2016 16:11   ebbie Mandell discussed with her and husband  A) nodule seen on  CT   abd left lingula area has resolved - so this is not cancer  B) no new nodules  C) possible ILD present to explain her crackles- she had pulmonary function test 05/30/2016 that showed FVC 1.92 L/57%, FEV1 1.7 L/66% with a ratio of 89 suggestive of restriction. There was only 9% bronchodilator response. Total lung capacity 4.92 L/94%. DLCO 15.6/60% and reduced in all suggestive of intrinsic parenchymal lung disease with restriction. Likely ILD. Walking desaturation test 185 feet 3 laps and rheumatic: She became short of breath the second lap did not have chest pain did not desaturate.   D) co art atherosclerosis + - proably related to agingl has appt with cards   E) the want to cancel sleep Dr. appointment. Because she is feeling well and stable on CPAP   [place] OV 10/01/2016  Chief Complaint  Patient presents with  . Acute Visit    increased SOB x2 months, drastically worsening x1 week.  sats were 84% RA upon entering exam room.  denies any tightness, wheezing, increased cough   Follow-up dyspnea cough with crackles interstitial lung disease  Last visit was in July 2017. CT scan high resolution at that time showed possible interstitial lung disease as interpreted by thoracic radiologist. Are function tests showed restriction with low DLCO. I had a discussion about surgical lung biopsy because of negative autoimmune profile. She and her husband remember that. She was reluctant to have lung biopsy. She opted for follow-up. A follow-up CT chest was recommended in  6 months time. However she is here acutely because in the last 3 weeks or so according to her and her husband she's had declined and shortness of breath. She is now dyspneic walking short distances. In our office 185 feet 3 laps on room air: She only walk one lap became dyspneic and desaturated to 84%. This is a new finding for her. She did not have this at the last visit in July 2017. It is noted in November 2017 she did have  shoulder surgery and was under general anesthesia and she tolerated the surgery fine. However the decline is only in the last few weeks. This no viral syndrome which she is worried about virus flare up. I mentioned surgical lung biopsy as a way to understand her interstitial lung disease and also mention that interstitial lung disease is contributing to symptoms most likely: She is still very reluctant to have surgical lung biopsy.  Of note she had a cardiology appointment in November 2017 but this is been postponed to February 2018 and so far she has not had seen the cardiologist for coronary artery calcification this because of intervening shoulder surgery in November 2017.   OV 10/04/2016  Chief Complaint  Patient presents with  . Follow-up    Pt here after HRCT and PFT. Pt was unable to perform PFT. Pt c/o facial burning, SOB has worsened. Pt states she has a dry cough and belching. Pt denies CP/tightness and f/c/s.     She presents for interstitial lung disease follow-up. With worsening dyspnea. This is to review test results of pulmonary function test and high resolution CT chest. She could not perform pulmonary function test today because of dyspnea and anxiety. High-resolution CT chest shows evolution of interstitial lung disease. It is in a pattern that is not consistent with UIP. Radiologist is slightly needs with hypersensitivity pneumonitis. She feels her dyspnea is worse although the cough is better with doxycycline. Walking desaturation test 185 feet 3 laps on room air: She desaturated one lap but did walk a little bit more distance then a few days ago   ACCP ILD question  -- symptoms: cough: dry cough for a month including night clugh. Dyspnea - very seere class 3 . Worsening 6 weeks  - past hx: positive for dysphagia, gerd, dry eyes, and arthralgia (autoimmune negative) - personal exposiure: denies tobacco Or recreational drug use - Family history of lung disease: Family members  unclear who have emphysema asthma and cystic fibrosis but denies hypersensitivity pneumonitis and interstitial lung disease - Home exposures history: Her husband uses a feathered pillow which she does not use any pillow. They have had it for a few years. There is no blurred or mold. This no humidifier in the house. no hot tub no jacuzzi. no water damage no mold or animals. - Living history: Has lived in [place] all her life - Occupational history: Has worked in an office as a support person for 10 years. No exposures.. Denies any farm work painting, Tourist information centre manager, Production designer, theatre/television/film. - Pulmonary toxicity history: No radiation or methotrexate or nitrofurantoin or  amiodarone    Ct Chest High Resolution  Result Date: 10/03/2016 CLINICAL DATA:  Worsening dyspnea for 2 weeks. Follow-up prior high-resolution chest CT findings suggestive of interstitial lung disease. EXAM: CT CHEST WITHOUT CONTRAST TECHNIQUE: Multidetector CT imaging of the chest was performed following the standard protocol without intravenous contrast. High resolution imaging of the lungs, as well as inspiratory and expiratory imaging, was performed. COMPARISON:  04/10/2016 high-resolution chest CT. FINDINGS: Cardiovascular: Top-normal heart size. No significant pericardial fluid/thickening. Left anterior descending coronary atherosclerosis. Mildly atherosclerotic nonaneurysmal thoracic aorta. Normal caliber pulmonary arteries (main pulmonary artery diameter 2.9 cm). Mediastinum/Nodes: Hypodense 0.4 cm posterior right thyroid lobe nodule. Unremarkable esophagus. No pathologically enlarged axillary, mediastinal or gross hilar lymph nodes, noting limited sensitivity for the detection of hilar adenopathy on this noncontrast study. Lungs/Pleura: No pneumothorax. No pleural effusion. Subpleural posteromedial left lower lobe 4 mm solid pulmonary nodule (series 8/ image 55), stable since 04/10/2016, considered benign. No acute  consolidative airspace disease, lung masses or new significant pulmonary nodules. There is patchy ground-glass attenuation and reticulation involving the peribronchovascular and subpleural lungs bilaterally, slightly asymmetrically more prominent in the left lung. There is associated mild parenchymal distortion and mild traction bronchiectasis throughout both lungs. Areas of involvement of the posterior lungs persist on the prone sequences. There is no clear basilar gradient. There is no frank honeycombing. These findings have progressed since 04/10/2016. There is mild patchy air trapping throughout both lungs on the expiration sequence. Upper abdomen: Cholecystectomy. Musculoskeletal: No aggressive appearing focal osseous lesions. Moderate thoracic spondylosis. IMPRESSION: 1. Interval progression of fibrotic interstitial lung disease characterized by peribronchovascular and subpleural ground-glass attenuation and reticulation with associated mild parenchymal distortion and mild traction bronchiectasis. Mild patchy air trapping. No frank honeycombing. No clear basilar gradient. This spectrum of findings is most consistent with chronic hypersensitivity pneumonitis. Progression of findings is not typical of fibrotic nonspecific interstitial pneumonia (NSIP). These findings are not consistent with usual interstitial pneumonia (UIP). 2. Aortic atherosclerosis.  One vessel coronary atherosclerosis. Electronically Signed   By: Ilona Sorrel M.D.   On: 10/03/2016 14:00     Review of Systems     Objective:   Physical Exam  Vitals:   10/04/16 1010  BP: 140/82  Pulse: 93  Temp: 98.3 F (36.8 C)  TempSrc: Oral  SpO2: 96%   96% RA atrest  Discussion only - very anxious and tearful and periodically hyperventilating         Assessment:       ICD-9-CM ICD-10-CM   1. ILD (interstitial lung disease) ([place]) 515 J84.9   2. Dyspnea, unspecified type 786.09 R06.00        Plan:      She has  interstitial lung disease not otherwise specified. Etiology is unknown. There has certainly been progression based on symptoms, new onset of desaturation with walking and evolution of high-resolution CT chest findings. The CT scan is not consistent with UIP. Therefore with negative autoimmune profile age less than 37 a biopsy is indicated. It is possible that this is hypersensitivity pneumonitis based on suggestion on CT scan and also the fact the husband uses a feathered pillow but again the clinical study for this is not very high. Biopsies indicated. She is extremely anxious. She's also in spiritual distress about the fact she has interstitial lung disease. It took some effort from her husband to calm her down before she could processes a lot of information. Husband is very attentive. We discussed pros and cons about different approaches. Approaches we discussed where  - Empiric prednisone: She has side effects from prednisone suggest pancreatitis in the past. She's also wary about the long-term side effects of prednisone. Without knowing the diagnosis specific variety of ILD dose and duration of prednisone is uncertain. Furthermore she progresses further and biopsy could become even more risky  - Surgical lung biopsy approach first: Risk of interstitial lung disease flare which  uncommon and other complications discussed. She is nervous about getting biopsy but after the above discussion she is open to meeting Dr. Roxan Hockey on thoracic surgery and then making a decision about biopsy. If she declines biopsy she is open to doing empiric prednisone for at least 3-6 months.  In the interim they will get rid of  the feathered pillow  Bronchoscopy with lavage option discussed but probability for  yield is low  PLAN  Progressive Specific variety unknown but hypersensitivity pneumonitis a possibility  Plan - do ACCP ILD questioin - repeat walk test  - use 2L O2 with exertion - get rid of feathered  pillow of your husband  - refer Dr Roxan Hockey for discussio on surgical lung biopsy  - to be seen week of 10/07/16 - if after meeting Dr Roxan Hockey if you decide against biopsy, we can do empiric prednisone  Followup 3-4 weeks with me    > 50% of this > 25 min visit spent in face to face counseling or coordination of care   Dr. Brand Males, M.D., [facility].C.P Pulmonary and Critical Care Medicine Staff Physician [facility] Pager: [phone], If no answer or between  15:00h - 7:00h: call [phone]  10/04/2016 12:35 PM

## 2016-10-06 ENCOUNTER — Emergency Department (HOSPITAL_COMMUNITY): Payer: BLUE CROSS/BLUE SHIELD

## 2016-10-06 ENCOUNTER — Inpatient Hospital Stay (HOSPITAL_COMMUNITY)
Admission: EM | Admit: 2016-10-06 | Discharge: 2016-10-09 | DRG: 175 | Disposition: A | Discharge status: Home / Self Care | Payer: BLUE CROSS/BLUE SHIELD | Attending: Internal Medicine | Admitting: Internal Medicine

## 2016-10-06 ENCOUNTER — Encounter (HOSPITAL_COMMUNITY): Payer: Self-pay | Admitting: Emergency Medicine

## 2016-10-06 DIAGNOSIS — Z79899 Other long term (current) drug therapy: Secondary | ICD-10-CM | POA: Diagnosis not present

## 2016-10-06 DIAGNOSIS — J849 Interstitial pulmonary disease, unspecified: Secondary | ICD-10-CM | POA: Diagnosis not present

## 2016-10-06 DIAGNOSIS — K589 Irritable bowel syndrome without diarrhea: Secondary | ICD-10-CM | POA: Diagnosis present

## 2016-10-06 DIAGNOSIS — E785 Hyperlipidemia, unspecified: Secondary | ICD-10-CM | POA: Diagnosis present

## 2016-10-06 DIAGNOSIS — Z87891 Personal history of nicotine dependence: Secondary | ICD-10-CM

## 2016-10-06 DIAGNOSIS — R069 Unspecified abnormalities of breathing: Secondary | ICD-10-CM | POA: Diagnosis not present

## 2016-10-06 DIAGNOSIS — I82493 Acute embolism and thrombosis of other specified deep vein of lower extremity, bilateral: Secondary | ICD-10-CM | POA: Diagnosis present

## 2016-10-06 DIAGNOSIS — I2609 Other pulmonary embolism with acute cor pulmonale: Secondary | ICD-10-CM | POA: Diagnosis not present

## 2016-10-06 DIAGNOSIS — G4733 Obstructive sleep apnea (adult) (pediatric): Secondary | ICD-10-CM | POA: Diagnosis present

## 2016-10-06 DIAGNOSIS — I82403 Acute embolism and thrombosis of unspecified deep veins of lower extremity, bilateral: Secondary | ICD-10-CM | POA: Diagnosis present

## 2016-10-06 DIAGNOSIS — M797 Fibromyalgia: Secondary | ICD-10-CM | POA: Diagnosis present

## 2016-10-06 DIAGNOSIS — E781 Pure hyperglyceridemia: Secondary | ICD-10-CM | POA: Diagnosis present

## 2016-10-06 DIAGNOSIS — Z9981 Dependence on supplemental oxygen: Secondary | ICD-10-CM | POA: Diagnosis not present

## 2016-10-06 DIAGNOSIS — Z888 Allergy status to other drugs, medicaments and biological substances status: Secondary | ICD-10-CM

## 2016-10-06 DIAGNOSIS — Z90710 Acquired absence of both cervix and uterus: Secondary | ICD-10-CM | POA: Diagnosis not present

## 2016-10-06 DIAGNOSIS — I82443 Acute embolism and thrombosis of tibial vein, bilateral: Secondary | ICD-10-CM | POA: Diagnosis not present

## 2016-10-06 DIAGNOSIS — F419 Anxiety disorder, unspecified: Secondary | ICD-10-CM | POA: Diagnosis not present

## 2016-10-06 DIAGNOSIS — F329 Major depressive disorder, single episode, unspecified: Secondary | ICD-10-CM | POA: Diagnosis present

## 2016-10-06 DIAGNOSIS — I248 Other forms of acute ischemic heart disease: Secondary | ICD-10-CM | POA: Diagnosis not present

## 2016-10-06 DIAGNOSIS — J9621 Acute and chronic respiratory failure with hypoxia: Secondary | ICD-10-CM | POA: Diagnosis not present

## 2016-10-06 DIAGNOSIS — Z882 Allergy status to sulfonamides status: Secondary | ICD-10-CM | POA: Diagnosis not present

## 2016-10-06 DIAGNOSIS — R0602 Shortness of breath: Secondary | ICD-10-CM | POA: Diagnosis not present

## 2016-10-06 DIAGNOSIS — Z7951 Long term (current) use of inhaled steroids: Secondary | ICD-10-CM | POA: Diagnosis not present

## 2016-10-06 DIAGNOSIS — K219 Gastro-esophageal reflux disease without esophagitis: Secondary | ICD-10-CM | POA: Diagnosis not present

## 2016-10-06 DIAGNOSIS — I2699 Other pulmonary embolism without acute cor pulmonale: Secondary | ICD-10-CM | POA: Diagnosis present

## 2016-10-06 DIAGNOSIS — I10 Essential (primary) hypertension: Secondary | ICD-10-CM | POA: Diagnosis present

## 2016-10-06 DIAGNOSIS — Z6841 Body Mass Index (BMI) 40.0 and over, adult: Secondary | ICD-10-CM

## 2016-10-06 LAB — CBC WITH DIFFERENTIAL/PLATELET
BASOS ABS: 0 10*3/uL (ref 0.0–0.1)
BASOS PCT: 0 %
EOS PCT: 3 %
Eosinophils Absolute: 0.3 10*3/uL (ref 0.0–0.7)
HEMATOCRIT: 38.7 % (ref 36.0–46.0)
Hemoglobin: 12.9 g/dL (ref 12.0–15.0)
Lymphocytes Relative: 31 %
Lymphs Abs: 2.4 10*3/uL (ref 0.7–4.0)
MCH: 26.9 pg (ref 26.0–34.0)
MCHC: 33.3 g/dL (ref 30.0–36.0)
MCV: 80.6 fL (ref 78.0–100.0)
MONOS PCT: 5 %
Monocytes Absolute: 0.4 10*3/uL (ref 0.1–1.0)
NEUTROS ABS: 4.7 10*3/uL (ref 1.7–7.7)
NEUTROS PCT: 61 %
PLATELETS: 247 10*3/uL (ref 150–400)
RBC: 4.8 MIL/uL (ref 3.87–5.11)
RDW: 14.1 % (ref 11.5–15.5)
WBC: 7.7 10*3/uL (ref 4.0–10.5)

## 2016-10-06 LAB — COMPREHENSIVE METABOLIC PANEL
ALK PHOS: 62 U/L (ref 38–126)
ALT: 9 U/L — AB (ref 14–54)
ANION GAP: 12 (ref 5–15)
AST: 14 U/L — ABNORMAL LOW (ref 15–41)
Albumin: 3.6 g/dL (ref 3.5–5.0)
BUN: 10 mg/dL (ref 6–20)
CALCIUM: 9.7 mg/dL (ref 8.9–10.3)
CO2: 24 mmol/L (ref 22–32)
CREATININE: 0.77 mg/dL (ref 0.44–1.00)
Chloride: 102 mmol/L (ref 101–111)
GFR calc Af Amer: >60 mL/min (ref >60)
Glucose, Bld: 129 mg/dL — ABNORMAL HIGH (ref 65–99)
Potassium: 3.8 mmol/L (ref 3.5–5.1)
SODIUM: 138 mmol/L (ref 135–145)
TOTAL PROTEIN: 6.6 g/dL (ref 6.5–8.1)
Total Bilirubin: 0.3 mg/dL (ref 0.3–1.2)

## 2016-10-06 LAB — I-STAT TROPONIN, ED: Troponin i, poc: 0 ng/mL (ref 0.00–0.08)

## 2016-10-06 LAB — D-DIMER, QUANTITATIVE (NOT AT ARMC): D DIMER QUANT: 1.42 ug{FEU}/mL — AB (ref 0.00–0.50)

## 2016-10-06 LAB — BRAIN NATRIURETIC PEPTIDE: B NATRIURETIC PEPTIDE 5: 20.4 pg/mL (ref 0.0–100.0)

## 2016-10-06 MED ORDER — METHYLPREDNISOLONE SODIUM SUCC 125 MG IJ SOLR
80.0000 mg | Freq: Once | INTRAMUSCULAR | Status: AC
  Administered 2016-10-06: 80 mg via INTRAVENOUS
  Filled 2016-10-06: qty 2

## 2016-10-06 MED ORDER — IPRATROPIUM-ALBUTEROL 0.5-2.5 (3) MG/3ML IN SOLN
3.0000 mL | Freq: Once | RESPIRATORY_TRACT | Status: AC
  Administered 2016-10-06: 3 mL via RESPIRATORY_TRACT
  Filled 2016-10-06: qty 3

## 2016-10-06 NOTE — ED Triage Notes (Signed)
Per EMS: Pt c/o of SOB since 1830 today. On scene pt was c/o CP and HA.  Pt first BP was palpated at 210, HR was in the 120's, RR 28-30 and sats in the low 90's. Pt placed on 2L [place]. Pt recently Dx Interstitial lung disease.   Pt currently not c/o of CP or HA.    129/68 93 Hr  94% 2L [place]

## 2016-10-06 NOTE — ED Notes (Signed)
Patient transported to X-ray 

## 2016-10-06 NOTE — ED Provider Notes (Signed)
[facility] DEPT Provider Note   CSN: [id number] Arrival date & time: 10/06/16  2134     History   Chief Complaint Chief Complaint  Patient presents with  . Shortness of Breath    HPI Stacey Alvarez is a 61 y.o. female.  HPI   Stacey Alvarez is a 61 y.o. female, with a history of ILD and HTN, presenting to the ED with shortness of breath that began around 6 pm this evening while at rest. She is been recently diagnosed in September 2017 with interstitial lung disease without known cause. This has apparently progressed in a short amount of time to the point that it now includes both lungs. Especially worsened over the past 3 weeks. Patient was placed on 2 lpm O2 PRN on 1/19.  Tonight, patient put on her oxygen when she started to feel short of breath, but states she did not get significant improvement. She became so short of breath that she was unable to finish a phone conversation. She endorses some decrease in shortness of breath when her oxygen was increased to 5 L a minute. She endorses some chest tightness during this time, but that has since resolved.  She states she has had a chronic dry cough and this has not worsened lately.  Denies fever/chills, N/V/D, recent illness, leg swelling, or any other complaints. Denies PE risk factors such as previous DVT/PE, recent surgery, recent trauma, or current cancer.  Supposed to see CT surgeon on Thursday, Jan 25, for lung biopsy. Patient has been seeing a pulmonologist, Dr. Chase Caller, whom she last saw last week. Was prescribed 5 days of doxycycline, which she finished yesterday.     Past Medical History:  Diagnosis Date  . Allergy   . Anxiety   . Arthritis    KNEES,BACK,HIPS  . Depression   . Fibromyalgia   . GERD (gastroesophageal reflux disease)   . Hyperlipidemia   . Hypertension   . IBS (irritable bowel syndrome)   . Interstitial lung disease ([place])   . Sleep apnea     Patient Active Problem List   Diagnosis Date Noted    . ILD (interstitial lung disease) ([place]) 10/01/2016  . Dyspnea and respiratory abnormality 10/01/2016  . Atherosclerosis of native coronary artery without angina pectoris 10/01/2016  . Chronic cough 04/05/2016  . Bibasilar crackles 04/05/2016  . Incidental lung nodule, > 50mm and < 46mm 04/05/2016  . History of sleep apnea 04/05/2016  . Back pain 02/07/2016  . Severe obesity (BMI >= 40) ([place]) 11/24/2015  . Hyperlipidemia 05/24/2015  . Diverticulitis 01/09/2015  . Routine general medical examination at a health care facility 11/21/2014  . Cough 08/09/2014  . Sinusitis, acute maxillary 07/28/2014  . UTI (urinary tract infection) 05/19/2014  . Dysuria 04/22/2014  . Vaginitis and vulvovaginitis 04/22/2014  . HTN (hypertension) 10/28/2013  . OSA (obstructive sleep apnea) 10/28/2013  . Insulin resistance 10/28/2013  . GERD (gastroesophageal reflux disease) 10/28/2013  . Depression with anxiety 10/28/2013  . Postmenopausal HRT (hormone replacement therapy) 10/28/2013    Past Surgical History:  Procedure Laterality Date  . CHOLECYSTECTOMY    . COLONOSCOPY    . PARTIAL HYSTERECTOMY  1999  . POLYPECTOMY      OB History    No data available       Home Medications    Prior to Admission medications   Medication Sig Start Date End Date Taking? Authorizing Provider  ALPRAZolam Duanne Moron) 1 MG tablet 0.5 mg in the am and 2mg  at night 11/01/14  Yes Historical Provider, MD  amLODipine (NORVASC) 5 MG tablet Take 1 tablet by mouth at  bedtime 04/02/16  Yes Midge Minium, MD  butalbital-aspirin-caffeine-codeine [facility] WITH CODEINE) [phone] MG capsule TAKE 1 CAPSULE BY MOUTH EVERY 6 HOURS AS NEEDED FOR PAIN 08/16/16  Yes Brunetta Jeans, PA-C  cetirizine (ZYRTEC) 10 MG tablet Take 10 mg by mouth daily.   Yes Historical Provider, MD  fenofibrate 160 MG tablet TAKE 1 TABLET BY MOUTH  DAILY 08/19/16  Yes Midge Minium, MD  FLUoxetine (PROZAC) 20 MG tablet Take 40 mg by mouth daily.    Yes Historical Provider, MD  fluticasone Asencion Islam) 50 MCG/ACT nasal spray Use 2 sprays in each  nostril daily 01/23/16  Yes Midge Minium, MD  HYDROcodone-acetaminophen (NORCO) 10-325 MG tablet Take 1 tablet by mouth every 6 (six) hours as needed.   Yes Historical Provider, MD  lamoTRIgine (LAMICTAL) 100 MG tablet Take 100 mg by mouth daily.   Yes Historical Provider, MD  lisinopril (PRINIVIL,ZESTRIL) 20 MG tablet TAKE 1 TABLET BY MOUTH TWO  TIMES DAILY 07/22/16  Yes Midge Minium, MD  OXYGEN Inhale 2 L into the lungs as needed (SOB).   Yes Historical Provider, MD  pravastatin (PRAVACHOL) 20 MG tablet TAKE 1 TABLET(20 MG) BY MOUTH DAILY 07/04/16  Yes Midge Minium, MD  PREMARIN 0.625 MG tablet TAKE 1 TABLET BY MOUTH  (0.625MG ) DAILY 09/20/16  Yes Midge Minium, MD  promethazine (PHENERGAN) 25 MG tablet Take 1 tablet (25 mg total) by mouth every 8 (eight) hours as needed for nausea or vomiting. 08/26/16  Yes Midge Minium, MD  ranitidine (ZANTAC) 150 MG tablet Take 1 tablet (150 mg total) by mouth 2 (two) times daily. 05/21/16  Yes Midge Minium, MD  temazepam (RESTORIL) 30 MG capsule Take 30 mg by mouth at bedtime.   Yes Historical Provider, MD  acetaminophen-codeine (TYLENOL #3) 300-30 MG tablet Take 1 tablet by mouth as needed.  06/05/16   Historical Provider, MD  doxycycline (VIBRA-TABS) 100 MG tablet Take 1 tablet, twice daily for 5 days. Avoid sunlight and take after meals. Patient not taking: Reported on 10/06/2016 10/01/16   Brand Males, MD  methocarbamol (ROBAXIN) 500 MG tablet Take 500 mg by mouth 2 (two) times daily.    Historical Provider, MD    Family History Family History  Problem Relation Age of Onset  . Diabetes Mother   . Heart disease Mother   . Anxiety disorder Mother   . Depression Mother   . Kidney disease Mother   . Diabetes Father   . Heart disease Father   . Anxiety disorder Father   . Depression Father   . Diabetes Paternal Grandmother    . Cystic fibrosis Son   . Alcohol abuse Son     Social History Social History  Substance Use Topics  . Smoking status: Former Research scientist (life sciences)  . Smokeless tobacco: Never Used     Comment: smoked 2 years in late teens about 8-10 cigs daily.   . Alcohol use 0.0 oz/week     Comment: OCC     Allergies   Prednisone; Macrobid [nitrofurantoin monohyd macro]; and Sulfa antibiotics   Review of Systems Review of Systems  Constitutional: Negative for chills, diaphoresis and fever.  Respiratory: Positive for shortness of breath.   Cardiovascular: Negative for chest pain and leg swelling.  Gastrointestinal: Negative for nausea and vomiting.  Neurological: Negative for dizziness, weakness, light-headedness and numbness.  All other systems  reviewed and are negative.    Physical Exam Updated Vital Signs BP 163/100 (BP Location: Left Arm)   Pulse 89   Temp 99.1 F (37.3 C) (Oral)   Resp 18   Ht 5\' 4"  (1.626 m)   Wt 108.9 kg   SpO2 97%   BMI 41.20 kg/m   Physical Exam  Constitutional: She appears well-developed and well-nourished. No distress.  HENT:  Head: Normocephalic and atraumatic.  Eyes: Conjunctivae are normal.  Neck: Neck supple.  Cardiovascular: Normal rate, regular rhythm, normal heart sounds and intact distal pulses.   Pulmonary/Chest: She has decreased breath sounds.  Increased work of breathing. Speaks in short phrases and has to pause to recover. Sitting up dropped SPO2 to 80%.  Abdominal: Soft. There is no tenderness. There is no guarding.  Musculoskeletal: She exhibits no edema.  Lymphadenopathy:    She has no cervical adenopathy.  Neurological: She is alert.  Skin: Skin is warm and dry. She is not diaphoretic.  Psychiatric: She has a normal mood and affect. Her behavior is normal.  Nursing note and vitals reviewed.    ED Treatments / Results  Labs (all labs ordered are listed, but only abnormal results are displayed) Labs Reviewed  COMPREHENSIVE METABOLIC  PANEL - Abnormal; Notable for the following:       Result Value   Glucose, Bld 129 (*)    AST 14 (*)    ALT 9 (*)    All other components within normal limits  URINALYSIS, ROUTINE W REFLEX MICROSCOPIC - Abnormal; Notable for the following:    APPearance HAZY (*)    All other components within normal limits  D-DIMER, QUANTITATIVE (NOT AT [facility]) - Abnormal; Notable for the following:    D-Dimer, Quant 1.42 (*)    All other components within normal limits  I-STAT VENOUS BLOOD GAS, ED - Abnormal; Notable for the following:    pH, Ven 7.445 (*)    pCO2, Ven 35.6 (*)    pO2, Ven 70.0 (*)    All other components within normal limits  RESPIRATORY PANEL BY PCR  CULTURE, BLOOD (ROUTINE X 2)  CULTURE, BLOOD (ROUTINE X 2)  URINE CULTURE  CBC WITH DIFFERENTIAL/PLATELET  BRAIN NATRIURETIC PEPTIDE  INFLUENZA PANEL BY PCR (TYPE A & B)  HEPARIN LEVEL (UNFRACTIONATED)  I-STAT TROPOININ, ED  I-STAT CG4 LACTIC ACID, ED  I-STAT ARTERIAL BLOOD GAS, ED  I-STAT CG4 LACTIC ACID, ED    EKG  EKG Interpretation None       Radiology Dg Chest 2 View  Result Date: 10/06/2016 CLINICAL DATA:  61 year old female with shortness of breath. EXAM: CHEST  2 VIEW COMPARISON:  Chest CT dated 10/03/2016 FINDINGS: There are bibasilar streaky densities, left greater right corresponding to the ground-glass and reticular densities noted on the CT and possibly related to hypersensitivity pneumonitis. Developing infiltrate is not entirely excluded. There is no focal consolidation, pleural effusion, or pneumothorax. The cardiac silhouette is within normal limits. No acute osseous pathology identified. Right upper quadrant cholecystectomy clips noted. IMPRESSION: Bibasilar streaky densities, left greater right may represent hypersensitivity pneumonitis. Developing infiltrate not excluded. Clinical correlation is recommended. Electronically Signed   By: Anner Crete M.D.   On: 10/06/2016 23:40   Ct Angio Chest Pe W Or Wo  Contrast  Result Date: 10/07/2016 CLINICAL DATA:  61 year old female with shortness of breath and positive D-dimer. EXAM: CT ANGIOGRAPHY CHEST WITH CONTRAST TECHNIQUE: Multidetector CT imaging of the chest was performed using the standard protocol during bolus administration  of intravenous contrast. Multiplanar CT image reconstructions and MIPs were obtained to evaluate the vascular anatomy. CONTRAST:  100 cc Isovue 370 COMPARISON:  Chest radiograph dated 10/06/2016 and CT dated 10/03/2016 FINDINGS: Cardiovascular: There is mild cardiomegaly. There is enlargement of the right cardiac chamber compatible with right cardiac straining. The R weeks/LV diameter ratio is 4.7 cm/2.2 cm. There is no pericardial effusion. The thoracic aorta is unremarkable. The origins of the great vessels of the aortic arch appear patent. There is pulmonary artery embolus involving the lobar and segmental branches of the right lower lobe as well as right upper lobe. Left upper lobe segmental pulmonary artery embolus is also noted. Evaluation is limited due to suboptimal opacification of the peripheral branches and streak artifact caused by patient's arms. Mediastinum/Nodes: There is no hilar or mediastinal adenopathy. Esophagus and the thyroid gland are grossly unremarkable. Lungs/Pleura: Bilateral streaky interstitial densities as seen on the prior study primarily involving the lung bases may be combination of atelectasis/ scarring and underlying interstitial lung disease and hypersensitivity pneumonitis. Clinical correlation is recommended. There is no focal consolidation, pleural effusion, or pneumothorax. The central airways are patent. Upper Abdomen: No acute abnormality. Musculoskeletal: No chest wall abnormality. No acute or significant osseous findings. Review of the MIP images confirms the above findings. IMPRESSION: Positive for acute PE with CT evidence of right heart strain (RV/LV Ratio = 2) consistent with at least submassive  (intermediate risk) PE. The presence of right heart strain has been associated with an increased risk of morbidity and mortality. Please activate Code PE by paging [phone]. These results were called by telephone at the time of interpretation on 10/07/2016 at 2:50 am to Dr. Arlean Hopping , who verbally acknowledged these results. Electronically Signed   By: Anner Crete M.D.   On: 10/07/2016 02:59    Procedures Procedures (including critical care time)  CRITICAL CARE Performed by: Shawn C Joy Total critical care time: 75 minutes Critical care time was exclusive of separately billable procedures and treating other patients. Critical care was necessary to treat or prevent imminent or life-threatening deterioration. Critical care was time spent personally by me on the following activities: development of treatment plan with patient and/or surrogate as well as nursing, discussions with consultants, evaluation of patient's response to treatment, examination of patient, obtaining history from patient or surrogate, ordering and performing treatments and interventions, ordering and review of laboratory studies, ordering and review of radiographic studies, pulse oximetry and re-evaluation of patient's condition.  Medications Ordered in ED Medications  vancomycin (VANCOCIN) 2,000 mg in sodium chloride 0.9 % 500 mL IVPB (2,000 mg Intravenous New Bag/Given 10/07/16 0211)  heparin bolus via infusion 5,000 Units (not administered)  heparin ADULT infusion 100 units/mL (25000 units/242mL sodium chloride 0.45%) (not administered)  ipratropium-albuterol (DUONEB) 0.5-2.5 (3) MG/3ML nebulizer solution 3 mL (3 mLs Nebulization Given 10/06/16 2326)  methylPREDNISolone sodium succinate (SOLU-MEDROL) 125 mg/2 mL injection 80 mg (80 mg Intravenous Given 10/06/16 2326)  sodium chloride 0.9 % bolus 1,000 mL (0 mLs Intravenous Stopped 10/07/16 0210)    Followed by  sodium chloride 0.9 % bolus 1,000 mL (1,000 mLs  Intravenous New Bag/Given 10/07/16 0211)  piperacillin-tazobactam (ZOSYN) IVPB 3.375 g (0 g Intravenous Stopped 10/07/16 0210)  iopamidol (ISOVUE-370) 76 % injection (100 mLs  Contrast Given 10/07/16 0142)     Initial Impression / Assessment and Plan / ED Course  I have reviewed the triage vital signs and the nursing notes.  Pertinent labs & imaging results that were available  during my care of the patient were reviewed by me and considered in my medical decision making (see chart for details).     Patient presents with rather sudden onset shortness of breath or couple hours prior to arrival. Patient has a drop in SPO2 with slightest exertion. Increased oxygen requirement. Antibiotics initially started due to suspicion of superimposed infection. PE also a possibility. Bilateral PEs with evidence of right heart strain. Heparin ordered. Intensivist consulted. Admission via hospitalist.    Patient care progression details: 10:36 PM Spoke with Cecilie Lowers, pharmacist, who advises that despite her previous reaction to prednisone, she should be able to tolerate SoluMedrol. 12:05 AM Patient began to Slowly decompensate in her breathing. Respiratory rate increased to 40 and patient only speaks in one and 2 word sentences and stating, "I just feel like I'm suffocating." Patient exhibited some minor circumoral cyanosis. This resolved by slowing the patient's respiratory rate and increasing her oxygen to 4 L a minute. Patient now has fine crackles in the bases of her lungs. We will try BiPAP. Patient was asked about intubation and other DO NOT RESUSCITATE status questions. Patient states that she would need to discuss it with her husband.  12: 50 AM Patient began to improve and was able to reduce her oxygen use back down to 2 lpm. However, patient's SPO2 continues to drop and her work of breathing continues to rise with slightest exertion. 2:54 AM Radiologist called and notified me of bilateral PEs and evidence  of right heart strain. Intensivist consult for TPA candidacy. Admit via hospitalist.  3:11 AM Spoke with Dr. Elsworth Soho, intensivist, who states that initial review of the CT indicates patient is not a candidate for TPA, but they will come to assess the patient. Recommends hospitalist admission and heparin. 3:33 AM Spoke with Dr. Hal Hope, hospitalist, who agreed to admit the patient to stepdown inpatient.  Findings and plan of care discussed with Duffy Bruce, MD and then with Dr. Roxanne Mins after EDP shift change. Dr. Ellender Hose personally evaluated and examined this patient.  Vitals:   10/07/16 0015 10/07/16 0045 10/07/16 0105 10/07/16 0215  BP: 141/81   156/74  Pulse: 86   88  Resp: 19   18  Temp:   98.4 F (36.9 C)   TempSrc:   Rectal   SpO2: 98% 98%  99%  Weight:      Height:          Final Clinical Impressions(s) / ED Diagnoses   Final diagnoses:  Bilateral pulmonary embolism [facility])    New Prescriptions New Prescriptions   No medications on file     Layla Maw 10/07/16 [place], MD 10/07/16 2041

## 2016-10-07 ENCOUNTER — Inpatient Hospital Stay (HOSPITAL_COMMUNITY)
Admit: 2016-10-07 | Discharge: 2016-10-07 | Disposition: A | Discharge status: Home / Self Care | Payer: BLUE CROSS/BLUE SHIELD | Attending: Internal Medicine | Admitting: Internal Medicine

## 2016-10-07 ENCOUNTER — Inpatient Hospital Stay (HOSPITAL_COMMUNITY): Payer: BLUE CROSS/BLUE SHIELD

## 2016-10-07 ENCOUNTER — Encounter (HOSPITAL_COMMUNITY): Payer: Self-pay | Admitting: Radiology

## 2016-10-07 ENCOUNTER — Emergency Department (HOSPITAL_COMMUNITY): Payer: BLUE CROSS/BLUE SHIELD

## 2016-10-07 DIAGNOSIS — E785 Hyperlipidemia, unspecified: Secondary | ICD-10-CM | POA: Diagnosis present

## 2016-10-07 DIAGNOSIS — F419 Anxiety disorder, unspecified: Secondary | ICD-10-CM | POA: Diagnosis present

## 2016-10-07 DIAGNOSIS — Z888 Allergy status to other drugs, medicaments and biological substances status: Secondary | ICD-10-CM | POA: Diagnosis not present

## 2016-10-07 DIAGNOSIS — K219 Gastro-esophageal reflux disease without esophagitis: Secondary | ICD-10-CM | POA: Diagnosis present

## 2016-10-07 DIAGNOSIS — F329 Major depressive disorder, single episode, unspecified: Secondary | ICD-10-CM | POA: Diagnosis present

## 2016-10-07 DIAGNOSIS — J9621 Acute and chronic respiratory failure with hypoxia: Secondary | ICD-10-CM | POA: Diagnosis present

## 2016-10-07 DIAGNOSIS — M797 Fibromyalgia: Secondary | ICD-10-CM | POA: Diagnosis present

## 2016-10-07 DIAGNOSIS — I2609 Other pulmonary embolism with acute cor pulmonale: Secondary | ICD-10-CM | POA: Diagnosis not present

## 2016-10-07 DIAGNOSIS — Z7951 Long term (current) use of inhaled steroids: Secondary | ICD-10-CM | POA: Diagnosis not present

## 2016-10-07 DIAGNOSIS — Z79899 Other long term (current) drug therapy: Secondary | ICD-10-CM | POA: Diagnosis not present

## 2016-10-07 DIAGNOSIS — I2699 Other pulmonary embolism without acute cor pulmonale: Secondary | ICD-10-CM | POA: Diagnosis not present

## 2016-10-07 DIAGNOSIS — K589 Irritable bowel syndrome without diarrhea: Secondary | ICD-10-CM | POA: Diagnosis present

## 2016-10-07 DIAGNOSIS — I2602 Saddle embolus of pulmonary artery with acute cor pulmonale: Secondary | ICD-10-CM

## 2016-10-07 DIAGNOSIS — Z882 Allergy status to sulfonamides status: Secondary | ICD-10-CM | POA: Diagnosis not present

## 2016-10-07 DIAGNOSIS — I82493 Acute embolism and thrombosis of other specified deep vein of lower extremity, bilateral: Secondary | ICD-10-CM | POA: Diagnosis present

## 2016-10-07 DIAGNOSIS — I82443 Acute embolism and thrombosis of tibial vein, bilateral: Secondary | ICD-10-CM | POA: Diagnosis present

## 2016-10-07 DIAGNOSIS — I82403 Acute embolism and thrombosis of unspecified deep veins of lower extremity, bilateral: Secondary | ICD-10-CM | POA: Diagnosis not present

## 2016-10-07 DIAGNOSIS — R0602 Shortness of breath: Secondary | ICD-10-CM | POA: Diagnosis not present

## 2016-10-07 DIAGNOSIS — J849 Interstitial pulmonary disease, unspecified: Secondary | ICD-10-CM | POA: Diagnosis not present

## 2016-10-07 DIAGNOSIS — Z90710 Acquired absence of both cervix and uterus: Secondary | ICD-10-CM | POA: Diagnosis not present

## 2016-10-07 DIAGNOSIS — Z87891 Personal history of nicotine dependence: Secondary | ICD-10-CM | POA: Diagnosis not present

## 2016-10-07 DIAGNOSIS — Z6841 Body Mass Index (BMI) 40.0 and over, adult: Secondary | ICD-10-CM | POA: Diagnosis not present

## 2016-10-07 DIAGNOSIS — Z9981 Dependence on supplemental oxygen: Secondary | ICD-10-CM | POA: Diagnosis not present

## 2016-10-07 DIAGNOSIS — I10 Essential (primary) hypertension: Secondary | ICD-10-CM | POA: Diagnosis not present

## 2016-10-07 DIAGNOSIS — G4733 Obstructive sleep apnea (adult) (pediatric): Secondary | ICD-10-CM | POA: Diagnosis not present

## 2016-10-07 DIAGNOSIS — I248 Other forms of acute ischemic heart disease: Secondary | ICD-10-CM | POA: Diagnosis present

## 2016-10-07 DIAGNOSIS — E781 Pure hyperglyceridemia: Secondary | ICD-10-CM | POA: Diagnosis present

## 2016-10-07 LAB — RESPIRATORY PANEL BY PCR
Adenovirus: NOT DETECTED
BORDETELLA PERTUSSIS-RVPCR: NOT DETECTED
CORONAVIRUS 229E-RVPPCR: NOT DETECTED
Chlamydophila pneumoniae: NOT DETECTED
Coronavirus HKU1: NOT DETECTED
Coronavirus NL63: NOT DETECTED
Coronavirus OC43: NOT DETECTED
INFLUENZA A-RVPPCR: NOT DETECTED
INFLUENZA B-RVPPCR: NOT DETECTED
METAPNEUMOVIRUS-RVPPCR: NOT DETECTED
Mycoplasma pneumoniae: NOT DETECTED
PARAINFLUENZA VIRUS 2-RVPPCR: NOT DETECTED
PARAINFLUENZA VIRUS 4-RVPPCR: NOT DETECTED
Parainfluenza Virus 1: NOT DETECTED
Parainfluenza Virus 3: NOT DETECTED
RESPIRATORY SYNCYTIAL VIRUS-RVPPCR: NOT DETECTED
RHINOVIRUS / ENTEROVIRUS - RVPPCR: NOT DETECTED

## 2016-10-07 LAB — CBC WITH DIFFERENTIAL/PLATELET
Basophils Absolute: 0 10*3/uL (ref 0.0–0.1)
Basophils Relative: 0 %
EOS ABS: 0 10*3/uL (ref 0.0–0.7)
Eosinophils Relative: 0 %
HCT: 36.1 % (ref 36.0–46.0)
Hemoglobin: 11.7 g/dL — ABNORMAL LOW (ref 12.0–15.0)
LYMPHS ABS: 1.1 10*3/uL (ref 0.7–4.0)
LYMPHS PCT: 16 %
MCH: 26.3 pg (ref 26.0–34.0)
MCHC: 32.4 g/dL (ref 30.0–36.0)
MCV: 81.1 fL (ref 78.0–100.0)
MONO ABS: 0 10*3/uL — AB (ref 0.1–1.0)
MONOS PCT: 1 %
Neutro Abs: 5.8 10*3/uL (ref 1.7–7.7)
Neutrophils Relative %: 83 %
PLATELETS: 219 10*3/uL (ref 150–400)
RBC: 4.45 MIL/uL (ref 3.87–5.11)
RDW: 14.1 % (ref 11.5–15.5)
WBC: 6.9 10*3/uL (ref 4.0–10.5)

## 2016-10-07 LAB — I-STAT ARTERIAL BLOOD GAS, ED
ACID-BASE EXCESS: 1 mmol/L (ref 0.0–2.0)
Bicarbonate: 24.9 mmol/L (ref 20.0–28.0)
O2 SAT: 97 %
TCO2: 26 mmol/L (ref 0–100)
pCO2 arterial: 38.9 mmHg (ref 32.0–48.0)
pH, Arterial: 7.416 (ref 7.350–7.450)
pO2, Arterial: 94 mmHg (ref 83.0–108.0)

## 2016-10-07 LAB — COMPREHENSIVE METABOLIC PANEL
ALBUMIN: 3.3 g/dL — AB (ref 3.5–5.0)
ALT: 9 U/L — AB (ref 14–54)
AST: 16 U/L (ref 15–41)
Alkaline Phosphatase: 53 U/L (ref 38–126)
Anion gap: 12 (ref 5–15)
BUN: 10 mg/dL (ref 6–20)
CHLORIDE: 109 mmol/L (ref 101–111)
CO2: 20 mmol/L — ABNORMAL LOW (ref 22–32)
CREATININE: 0.77 mg/dL (ref 0.44–1.00)
Calcium: 8.3 mg/dL — ABNORMAL LOW (ref 8.9–10.3)
GFR calc Af Amer: >60 mL/min (ref >60)
GLUCOSE: 201 mg/dL — AB (ref 65–99)
POTASSIUM: 4 mmol/L (ref 3.5–5.1)
Sodium: 141 mmol/L (ref 135–145)
Total Bilirubin: 0.4 mg/dL (ref 0.3–1.2)
Total Protein: 6.2 g/dL — ABNORMAL LOW (ref 6.5–8.1)

## 2016-10-07 LAB — I-STAT VENOUS BLOOD GAS, ED
ACID-BASE EXCESS: 1 mmol/L (ref 0.0–2.0)
Bicarbonate: 24.3 mmol/L (ref 20.0–28.0)
O2 SAT: 94 %
PO2 VEN: 70 mmHg — AB (ref 32.0–45.0)
Patient temperature: 99.1
TCO2: 25 mmol/L (ref 0–100)
pCO2, Ven: 35.6 mmHg — ABNORMAL LOW (ref 44.0–60.0)
pH, Ven: 7.445 — ABNORMAL HIGH (ref 7.250–7.430)

## 2016-10-07 LAB — LACTIC ACID, PLASMA: LACTIC ACID, VENOUS: 2.6 mmol/L — AB (ref 0.5–1.9)

## 2016-10-07 LAB — I-STAT CG4 LACTIC ACID, ED: LACTIC ACID, VENOUS: 1.66 mmol/L (ref 0.5–1.9)

## 2016-10-07 LAB — URINALYSIS, ROUTINE W REFLEX MICROSCOPIC
Bilirubin Urine: NEGATIVE
Glucose, UA: NEGATIVE mg/dL
Hgb urine dipstick: NEGATIVE
Ketones, ur: NEGATIVE mg/dL
Leukocytes, UA: NEGATIVE
NITRITE: NEGATIVE
PROTEIN: NEGATIVE mg/dL
SPECIFIC GRAVITY, URINE: 1.027 (ref 1.005–1.030)
pH: 5 (ref 5.0–8.0)

## 2016-10-07 LAB — HEPARIN LEVEL (UNFRACTIONATED)
HEPARIN UNFRACTIONATED: 0.93 [IU]/mL — AB (ref 0.30–0.70)
Heparin Unfractionated: 0.25 IU/mL — ABNORMAL LOW (ref 0.30–0.70)

## 2016-10-07 LAB — TROPONIN I
Troponin I: <0.03 ng/mL (ref <0.03)
Troponin I: 0.08 ng/mL (ref <0.03)
Troponin I: <0.03 ng/mL (ref <0.03)

## 2016-10-07 LAB — ECHOCARDIOGRAM COMPLETE
Height: 64 in
WEIGHTICAEL: 3774.28 [oz_av]

## 2016-10-07 LAB — INFLUENZA PANEL BY PCR (TYPE A & B)
INFLAPCR: NEGATIVE
Influenza B By PCR: NEGATIVE

## 2016-10-07 LAB — MRSA PCR SCREENING: MRSA by PCR: NEGATIVE

## 2016-10-07 LAB — BRAIN NATRIURETIC PEPTIDE: B NATRIURETIC PEPTIDE 5: 24.8 pg/mL (ref 0.0–100.0)

## 2016-10-07 MED ORDER — LISINOPRIL 20 MG PO TABS
20.0000 mg | ORAL_TABLET | Freq: Two times a day (BID) | ORAL | Status: DC
  Administered 2016-10-07 – 2016-10-09 (×5): 20 mg via ORAL
  Filled 2016-10-07 (×5): qty 1

## 2016-10-07 MED ORDER — LAMOTRIGINE 100 MG PO TABS
100.0000 mg | ORAL_TABLET | Freq: Every day | ORAL | Status: DC
  Administered 2016-10-07 – 2016-10-09 (×3): 100 mg via ORAL
  Filled 2016-10-07 (×4): qty 1

## 2016-10-07 MED ORDER — ACETAMINOPHEN 650 MG RE SUPP: 650.0000 mg | Freq: Four times a day (QID) | RECTAL | Status: DC | PRN

## 2016-10-07 MED ORDER — ALPRAZOLAM 0.5 MG PO TABS
2.0000 mg | ORAL_TABLET | Freq: Every day | ORAL | Status: DC
  Administered 2016-10-07 – 2016-10-08 (×2): 2 mg via ORAL
  Filled 2016-10-07 (×2): qty 4

## 2016-10-07 MED ORDER — ACETAMINOPHEN 325 MG PO TABS
650.0000 mg | ORAL_TABLET | Freq: Four times a day (QID) | ORAL | Status: DC | PRN
  Administered 2016-10-07 – 2016-10-09 (×3): 650 mg via ORAL
  Filled 2016-10-07 (×3): qty 2

## 2016-10-07 MED ORDER — LORATADINE 10 MG PO TABS
10.0000 mg | ORAL_TABLET | Freq: Every day | ORAL | Status: DC
  Administered 2016-10-07 – 2016-10-09 (×3): 10 mg via ORAL
  Filled 2016-10-07 (×3): qty 1

## 2016-10-07 MED ORDER — PIPERACILLIN-TAZOBACTAM 3.375 G IVPB
3.3750 g | Freq: Once | INTRAVENOUS | Status: AC
  Administered 2016-10-07: 3.375 g via INTRAVENOUS
  Filled 2016-10-07: qty 50

## 2016-10-07 MED ORDER — IOPAMIDOL (ISOVUE-370) INJECTION 76%
INTRAVENOUS | Status: AC
  Administered 2016-10-07: 100 mL
  Filled 2016-10-07: qty 100

## 2016-10-07 MED ORDER — METHOCARBAMOL 500 MG PO TABS
500.0000 mg | ORAL_TABLET | Freq: Two times a day (BID) | ORAL | Status: DC
  Filled 2016-10-07: qty 1

## 2016-10-07 MED ORDER — HEPARIN (PORCINE) IN NACL 100-0.45 UNIT/ML-% IJ SOLN
1300.0000 [IU]/h | INTRAMUSCULAR | Status: DC
  Administered 2016-10-07 (×2): 1500 [IU]/h via INTRAVENOUS
  Filled 2016-10-07 (×3): qty 250

## 2016-10-07 MED ORDER — FAMOTIDINE 20 MG PO TABS
20.0000 mg | ORAL_TABLET | Freq: Every day | ORAL | Status: DC
  Administered 2016-10-07 – 2016-10-09 (×3): 20 mg via ORAL
  Filled 2016-10-07 (×3): qty 1

## 2016-10-07 MED ORDER — FENOFIBRATE 160 MG PO TABS
160.0000 mg | ORAL_TABLET | Freq: Every day | ORAL | Status: DC
  Administered 2016-10-07 – 2016-10-08 (×2): 160 mg via ORAL
  Filled 2016-10-07 (×2): qty 1

## 2016-10-07 MED ORDER — ONDANSETRON HCL 4 MG/2ML IJ SOLN: 4.0000 mg | Freq: Four times a day (QID) | INTRAMUSCULAR | Status: DC | PRN

## 2016-10-07 MED ORDER — FLUTICASONE PROPIONATE 50 MCG/ACT NA SUSP
2.0000 | Freq: Every day | NASAL | Status: DC
  Filled 2016-10-07: qty 16

## 2016-10-07 MED ORDER — ONDANSETRON HCL 4 MG PO TABS: 4.0000 mg | ORAL_TABLET | Freq: Four times a day (QID) | ORAL | Status: DC | PRN

## 2016-10-07 MED ORDER — AMLODIPINE BESYLATE 5 MG PO TABS
5.0000 mg | ORAL_TABLET | Freq: Every day | ORAL | Status: DC
  Administered 2016-10-07 – 2016-10-08 (×2): 5 mg via ORAL
  Filled 2016-10-07 (×2): qty 1

## 2016-10-07 MED ORDER — FLUOXETINE HCL 20 MG PO CAPS
40.0000 mg | ORAL_CAPSULE | Freq: Every day | ORAL | Status: DC
  Administered 2016-10-07 – 2016-10-09 (×3): 40 mg via ORAL
  Filled 2016-10-07 (×5): qty 2

## 2016-10-07 MED ORDER — VANCOMYCIN HCL 10 G IV SOLR
2000.0000 mg | Freq: Once | INTRAVENOUS | Status: AC
  Administered 2016-10-07: 2000 mg via INTRAVENOUS
  Filled 2016-10-07: qty 2000

## 2016-10-07 MED ORDER — ALPRAZOLAM 0.5 MG PO TABS
0.5000 mg | ORAL_TABLET | Freq: Every day | ORAL | Status: DC
  Administered 2016-10-07 – 2016-10-09 (×3): 0.5 mg via ORAL
  Filled 2016-10-07: qty 2
  Filled 2016-10-07 (×2): qty 1

## 2016-10-07 MED ORDER — HEPARIN (PORCINE) IN NACL 100-0.45 UNIT/ML-% IJ SOLN
1800.0000 [IU]/h | INTRAMUSCULAR | Status: DC
  Filled 2016-10-07: qty 250

## 2016-10-07 MED ORDER — TEMAZEPAM 15 MG PO CAPS
30.0000 mg | ORAL_CAPSULE | Freq: Every day | ORAL | Status: DC
  Administered 2016-10-07 – 2016-10-08 (×2): 30 mg via ORAL
  Filled 2016-10-07 (×2): qty 2

## 2016-10-07 MED ORDER — SODIUM CHLORIDE 0.9 % IV BOLUS (SEPSIS)
1000.0000 mL | Freq: Once | INTRAVENOUS | Status: AC
  Administered 2016-10-07: 1000 mL via INTRAVENOUS

## 2016-10-07 MED ORDER — HYDROCODONE-ACETAMINOPHEN 10-325 MG PO TABS
1.0000 | ORAL_TABLET | Freq: Four times a day (QID) | ORAL | Status: DC | PRN
Start: 2016-10-07 — End: 2016-10-09
  Administered 2016-10-07 – 2016-10-08 (×2): 1 via ORAL
  Filled 2016-10-07 (×2): qty 1

## 2016-10-07 MED ORDER — SODIUM CHLORIDE 0.9 % IV SOLN
INTRAVENOUS | Status: AC
  Administered 2016-10-07: 07:00:00 via INTRAVENOUS

## 2016-10-07 MED ORDER — FENOFIBRATE 160 MG PO TABS
160.0000 mg | ORAL_TABLET | Freq: Every day | ORAL | Status: DC
  Filled 2016-10-07: qty 1

## 2016-10-07 MED ORDER — PRAVASTATIN SODIUM 20 MG PO TABS
20.0000 mg | ORAL_TABLET | Freq: Every day | ORAL | Status: DC
  Administered 2016-10-07 – 2016-10-09 (×3): 20 mg via ORAL
  Filled 2016-10-07 (×3): qty 1

## 2016-10-07 MED ORDER — HEPARIN BOLUS VIA INFUSION
5000.0000 [IU] | Freq: Once | INTRAVENOUS | Status: AC
  Administered 2016-10-07: 5000 [IU] via INTRAVENOUS
  Filled 2016-10-07: qty 5000

## 2016-10-07 NOTE — Progress Notes (Deleted)
CARDIOLOGY OFFICE NOTE  Date:  10/07/2016    Markus Daft Date of Birth: 1956/02/18 Medical Record [id number]  PCP:  Annye Asa, MD  Cardiologist:  Servando Snare & ***    No chief complaint on file.   History of Present Illness: Stacey Alvarez is a 61 y.o. female who presents today for a ***   Comes in today. Here with   Past Medical History:  Diagnosis Date  . Allergy   . Anxiety   . Arthritis    KNEES,BACK,HIPS  . Depression   . Fibromyalgia   . GERD (gastroesophageal reflux disease)   . Hyperlipidemia   . Hypertension   . IBS (irritable bowel syndrome)   . Interstitial lung disease ([place])   . Sleep apnea     Past Surgical History:  Procedure Laterality Date  . CHOLECYSTECTOMY    . COLONOSCOPY    . PARTIAL HYSTERECTOMY  1999  . POLYPECTOMY       Medications: No current facility-administered medications for this visit.    No current outpatient prescriptions on file.   Facility-Administered Medications Ordered in Other Visits  Medication Dose Route Frequency Provider Last Rate Last Dose  . 0.9 %  sodium chloride infusion   Intravenous Continuous Rise Patience, MD 75 mL/hr at 10/07/16 [phone]    . acetaminophen (TYLENOL) tablet 650 mg  650 mg Oral Q6H PRN Rise Patience, MD   650 mg at 10/07/16 1131   Or  . acetaminophen (TYLENOL) suppository 650 mg  650 mg Rectal Q6H PRN Rise Patience, MD      . ALPRAZolam Duanne Moron) tablet 0.5 mg  0.5 mg Oral Daily Rise Patience, MD   0.5 mg at 10/07/16 1042  . ALPRAZolam (XANAX) tablet 2 mg  2 mg Oral QHS Rise Patience, MD      . amLODipine (NORVASC) tablet 5 mg  5 mg Oral QHS Rise Patience, MD      . famotidine (PEPCID) tablet 20 mg  20 mg Oral Daily Rise Patience, MD   20 mg at 10/07/16 1043  . fenofibrate tablet 160 mg  160 mg Oral QHS Nishant Dhungel, MD      . FLUoxetine (PROZAC) capsule 40 mg  40 mg Oral Daily Rise Patience, MD   40 mg at 10/07/16 1131  . fluticasone  (FLONASE) 50 MCG/ACT nasal spray 2 spray  2 spray Each Nare Daily Rise Patience, MD      . heparin ADULT infusion 100 units/mL (25000 units/240mL sodium chloride 0.45%)  1,300 Units/hr Intravenous Continuous Nishant Dhungel, MD 13 mL/hr at 10/07/16 1133 1,300 Units/hr at 10/07/16 1133  . HYDROcodone-acetaminophen (NORCO) 10-325 MG per tablet 1 tablet  1 tablet Oral Q6H PRN Rise Patience, MD      . lamoTRIgine (LAMICTAL) tablet 100 mg  100 mg Oral Daily Rise Patience, MD   100 mg at 10/07/16 1131  . lisinopril (PRINIVIL,ZESTRIL) tablet 20 mg  20 mg Oral BID Rise Patience, MD   20 mg at 10/07/16 1043  . loratadine (CLARITIN) tablet 10 mg  10 mg Oral Daily Rise Patience, MD   10 mg at 10/07/16 1042  . methocarbamol (ROBAXIN) tablet 500 mg  500 mg Oral BID Rise Patience, MD      . ondansetron [facility]) tablet 4 mg  4 mg Oral Q6H PRN Rise Patience, MD       Or  . ondansetron [facility]) injection 4 mg  4 mg Intravenous Q6H PRN Rise Patience, MD      . pravastatin (PRAVACHOL) tablet 20 mg  20 mg Oral Daily Rise Patience, MD   20 mg at 10/07/16 1131  . temazepam (RESTORIL) capsule 30 mg  30 mg Oral QHS Rise Patience, MD        Allergies: Allergies  Allergen Reactions  . Prednisone     pancreatitis  . Macrobid [Nitrofurantoin Monohyd Macro] Diarrhea    Diarrhea, GI upset  . Sulfa Antibiotics Rash    Social History: The patient  reports that she has quit smoking. She has never used smokeless tobacco. She reports that she drinks alcohol. She reports that she does not use drugs.   Family History: The patient's ***family history includes Alcohol abuse in her son; Anxiety disorder in her father and mother; Cystic fibrosis in her son; Depression in her father and mother; Diabetes in her father, mother, and paternal grandmother; Heart disease in her father and mother; Kidney disease in her mother.   Review of Systems: Please see the history of  present illness.   Otherwise, the review of systems is positive for {NONE DEFAULTED:18576::"none"}.   All other systems are reviewed and negative.   Physical Exam: VS:  There were no vitals taken for this visit. Marland Kitchen  BMI There is no height or weight on file to calculate BMI.  Wt Readings from Last 3 Encounters:  10/07/16 235 lb 14.3 oz (107 kg)  10/01/16 237 lb (107.5 kg)  07/01/16 245 lb 6.4 oz (111.3 kg)    General: Pleasant. Well developed, well nourished and in no acute distress.   HEENT: Normal.  Neck: Supple, no JVD, carotid bruits, or masses noted.  Cardiac: ***Regular rate and rhythm. No murmurs, rubs, or gallops. No edema.  Respiratory:  Lungs are clear to auscultation bilaterally with normal work of breathing.  GI: Soft and nontender.  MS: No deformity or atrophy. Gait and ROM intact.  Skin: Warm and dry. Color is normal.  Neuro:  Strength and sensation are intact and no gross focal deficits noted.  Psych: Alert, appropriate and with normal affect.   LABORATORY DATA:  EKG:  EKG {ACTION; IS/IS [id number] ordered today. This demonstrates ***.  Lab Results  Component Value Date   WBC 6.9 10/07/2016   HGB 11.7 (L) 10/07/2016   HCT 36.1 10/07/2016   PLT 219 10/07/2016   GLUCOSE 201 (H) 10/07/2016   CHOL 241 (H) 06/12/2016   TRIG 277.0 (H) 06/12/2016   HDL 52.50 06/12/2016   LDLDIRECT 144.0 06/12/2016   ALT 9 (L) 10/07/2016   AST 16 10/07/2016   NA 141 10/07/2016   K 4.0 10/07/2016   CL 109 10/07/2016   CREATININE 0.77 10/07/2016   BUN 10 10/07/2016   CO2 20 (L) 10/07/2016   TSH 0.89 06/12/2016   HGBA1C 5.2 11/24/2015    BNP (last 3 results)  Recent Labs  10/06/16 2156 10/07/16 0542  BNP 20.4 24.8    ProBNP (last 3 results) No results for input(s): PROBNP in the last 8760 hours.   Other Studies Reviewed Today:   Assessment/Plan:   Current medicines are reviewed with the patient today.  The patient does not have concerns regarding medicines  other than what has been noted above.  The following changes have been made:  See above.  Labs/ tests ordered today include:   No orders of the defined types were placed in this encounter.    Disposition:   FU with ***  in {gen number [id number] {Days to years:10300}.   Patient is agreeable to this plan and will call if any problems develop in the interim.   SignedTruitt Merle, NP  10/07/2016 1:25 PM  [facility] [address] [place], [place]  [address] Phone: [phone] Fax: [phone]

## 2016-10-07 NOTE — Progress Notes (Addendum)
[place] for Heparin  Indication: pulmonary embolus, new onset  Allergies  Allergen Reactions  . Prednisone     pancreatitis  . Macrobid [Nitrofurantoin Monohyd Macro] Diarrhea    Diarrhea, GI upset  . Sulfa Antibiotics Rash   Patient Measurements: Height: 5\' 4"  (162.6 cm) Weight: 235 lb 14.3 oz (107 kg) IBW/kg (Calculated) : 54.7  Vital Signs: Temp: 97.5 F (36.4 C) (01/22 1110) Temp Source: Oral (01/22 1110) BP: 113/74 (01/22 1110) Pulse Rate: 90 (01/22 0952)  Labs:  Recent Labs  10/06/16 2207 10/07/16 0541 10/07/16 1002  HGB 12.9 11.7*  --   HCT 38.7 36.1  --   PLT 247 219  --   HEPARINUNFRC  --   --  0.93*  CREATININE 0.77 0.77  --   TROPONINI  --  <0.03 0.08*    Estimated Creatinine Clearance: 87 mL/min (by C-G formula based on SCr of 0.77 mg/dL).   Assessment: Heparin for new onset PE, pt with ?idiopathic ILD, here with shortness of breath, CT angio with PE, CBC/renal function ok, PTA meds reviewed.   Heparin level = 0.93 at 10 am, heparin off since 6:30 am   Goal of Therapy:  Heparin level 0.3-0.7 units/ml Monitor platelets by anticoagulation protocol: Yes   Plan:  Resume/Decrease heparin at 1300 units/hr Heparin level at 1800 pm Daily labs  Thank you Anette Guarneri, PharmD [phone]  10/07/2016,11:23 AM

## 2016-10-07 NOTE — H&P (Signed)
History and Physical    Stacey Alvarez DOB: Dec 20, 1955 DOA: 10/06/2016  PCP: Annye Asa, MD  Patient coming from: Home.  Chief Complaint: Shortness of breath.  HPI: Stacey Alvarez is a 61 y.o. female with history of recently diagnosed interstitial lung disease being planned for lung biopsy later this month, hypertension, sleep apnea and hypertriglyceridemia was recently started on statins and fenofibrate presents to the ER because of progressive worsening shortness of breath over the last 1 week. Shortness of breath was initially exertional later became present even at rest Patient states last week she also had low normal blood pressures. Has some right upper back pain. Denies any fever chills or productive cough. CT scan in the ER shows bilateral pulmonary embolism with possible strain pattern. ER physician had discussed with pulmonologist who recommended heparin infusion and they will be seeing patient in consult. Patient denies any recent travel or lower extremity edema. Patient is on Premarin for a long time. Patient is being admitted for acute bilateral pulmonary embolism.  ED Course: CT angiogram of the chest shows bilateral pulmonary embolism with possible strain pattern. Patient is hemodynamically stable in the ER. Pulmonary was consulted. Patient is started on heparin.  Review of Systems: As per HPI, rest all negative.   Past Medical History:  Diagnosis Date  . Allergy   . Anxiety   . Arthritis    KNEES,BACK,HIPS  . Depression   . Fibromyalgia   . GERD (gastroesophageal reflux disease)   . Hyperlipidemia   . Hypertension   . IBS (irritable bowel syndrome)   . Interstitial lung disease ([place])   . Sleep apnea     Past Surgical History:  Procedure Laterality Date  . CHOLECYSTECTOMY    . COLONOSCOPY    . PARTIAL HYSTERECTOMY  1999  . POLYPECTOMY       reports that she has quit smoking. She has never used smokeless tobacco. She reports that she drinks  alcohol. She reports that she does not use drugs.  Allergies  Allergen Reactions  . Prednisone     pancreatitis  . Macrobid [Nitrofurantoin Monohyd Macro] Diarrhea    Diarrhea, GI upset  . Sulfa Antibiotics Rash    Family History  Problem Relation Age of Onset  . Diabetes Mother   . Heart disease Mother   . Anxiety disorder Mother   . Depression Mother   . Kidney disease Mother   . Diabetes Father   . Heart disease Father   . Anxiety disorder Father   . Depression Father   . Diabetes Paternal Grandmother   . Cystic fibrosis Son   . Alcohol abuse Son     Prior to Admission medications   Medication Sig Start Date End Date Taking? Authorizing Provider  ALPRAZolam Duanne Moron) 1 MG tablet 0.5 mg in the am and 2mg  at night 11/01/14  Yes Historical Provider, MD  amLODipine (NORVASC) 5 MG tablet Take 1 tablet by mouth at  bedtime 04/02/16  Yes Midge Minium, MD  butalbital-aspirin-caffeine-codeine [facility] WITH CODEINE) [phone] MG capsule TAKE 1 CAPSULE BY MOUTH EVERY 6 HOURS AS NEEDED FOR PAIN 08/16/16  Yes Brunetta Jeans, PA-C  cetirizine (ZYRTEC) 10 MG tablet Take 10 mg by mouth daily.   Yes Historical Provider, MD  fenofibrate 160 MG tablet TAKE 1 TABLET BY MOUTH  DAILY 08/19/16  Yes Midge Minium, MD  FLUoxetine (PROZAC) 20 MG tablet Take 40 mg by mouth daily.   Yes Historical Provider, MD  fluticasone (FLONASE) 50  MCG/ACT nasal spray Use 2 sprays in each  nostril daily 01/23/16  Yes Midge Minium, MD  HYDROcodone-acetaminophen (NORCO) 10-325 MG tablet Take 1 tablet by mouth every 6 (six) hours as needed.   Yes Historical Provider, MD  lamoTRIgine (LAMICTAL) 100 MG tablet Take 100 mg by mouth daily.   Yes Historical Provider, MD  lisinopril (PRINIVIL,ZESTRIL) 20 MG tablet TAKE 1 TABLET BY MOUTH TWO  TIMES DAILY 07/22/16  Yes Midge Minium, MD  OXYGEN Inhale 2 L into the lungs as needed (SOB).   Yes Historical Provider, MD  pravastatin (PRAVACHOL) 20 MG tablet  TAKE 1 TABLET(20 MG) BY MOUTH DAILY 07/04/16  Yes Midge Minium, MD  PREMARIN 0.625 MG tablet TAKE 1 TABLET BY MOUTH  (0.625MG ) DAILY 09/20/16  Yes Midge Minium, MD  promethazine (PHENERGAN) 25 MG tablet Take 1 tablet (25 mg total) by mouth every 8 (eight) hours as needed for nausea or vomiting. 08/26/16  Yes Midge Minium, MD  ranitidine (ZANTAC) 150 MG tablet Take 1 tablet (150 mg total) by mouth 2 (two) times daily. 05/21/16  Yes Midge Minium, MD  temazepam (RESTORIL) 30 MG capsule Take 30 mg by mouth at bedtime.   Yes Historical Provider, MD  acetaminophen-codeine (TYLENOL #3) 300-30 MG tablet Take 1 tablet by mouth as needed.  06/05/16   Historical Provider, MD  doxycycline (VIBRA-TABS) 100 MG tablet Take 1 tablet, twice daily for 5 days. Avoid sunlight and take after meals. Patient not taking: Reported on 10/06/2016 10/01/16   Brand Males, MD  methocarbamol (ROBAXIN) 500 MG tablet Take 500 mg by mouth 2 (two) times daily.    Historical Provider, MD    Physical Exam: Vitals:   10/07/16 0015 10/07/16 0045 10/07/16 0105 10/07/16 0215  BP: 141/81   156/74  Pulse: 86   88  Resp: 19   18  Temp:   98.4 F (36.9 C)   TempSrc:   Rectal   SpO2: 98% 98%  99%  Weight:      Height:          Constitutional: Moderately built and nourished. Vitals:   10/07/16 0015 10/07/16 0045 10/07/16 0105 10/07/16 0215  BP: 141/81   156/74  Pulse: 86   88  Resp: 19   18  Temp:   98.4 F (36.9 C)   TempSrc:   Rectal   SpO2: 98% 98%  99%  Weight:      Height:       Eyes: Anicteric no pallor. ENMT: No discharge from the ears eyes nose or mouth. Neck: No mass felt. No JVD appreciated. Respiratory: No rhonchi or crepitations. Cardiovascular: S1 and S2 heard. No murmurs appreciated. Abdomen: Soft nontender bowel sounds present. Musculoskeletal: No edema. No joint effusion. Skin: No rash. Skin appears warm. Neurologic: Alert awake oriented to time place and person. Moves all  extremities. Psychiatric: Appears normal. Normal affect.   Labs on Admission: I have personally reviewed following labs and imaging studies  CBC:  Recent Labs Lab 10/06/16 2207  WBC 7.7  NEUTROABS 4.7  HGB 12.9  HCT 38.7  MCV 80.6  PLT [id number]   Basic Metabolic Panel:  Recent Labs Lab 10/06/16 2207  NA 138  K 3.8  CL 102  CO2 24  GLUCOSE 129*  BUN 10  CREATININE 0.77  CALCIUM 9.7   GFR: Estimated Creatinine Clearance: 87.9 mL/min (by C-G formula based on SCr of 0.77 mg/dL). Liver Function Tests:  Recent Labs Lab 10/06/16 2207  AST 14*  ALT 9*  ALKPHOS 62  BILITOT 0.3  PROT 6.6  ALBUMIN 3.6   No results for input(s): LIPASE, AMYLASE in the last 168 hours. No results for input(s): AMMONIA in the last 168 hours. Coagulation Profile: No results for input(s): INR, PROTIME in the last 168 hours. Cardiac Enzymes: No results for input(s): CKTOTAL, CKMB, CKMBINDEX, TROPONINI in the last 168 hours. BNP (last 3 results) No results for input(s): PROBNP in the last 8760 hours. HbA1C: No results for input(s): HGBA1C in the last 72 hours. CBG: No results for input(s): GLUCAP in the last 168 hours. Lipid Profile: No results for input(s): CHOL, HDL, LDLCALC, TRIG, CHOLHDL, LDLDIRECT in the last 72 hours. Thyroid Function Tests: No results for input(s): TSH, T4TOTAL, FREET4, T3FREE, THYROIDAB in the last 72 hours. Anemia Panel: No results for input(s): VITAMINB12, FOLATE, FERRITIN, TIBC, IRON, RETICCTPCT in the last 72 hours. Urine analysis:    Component Value Date/Time   COLORURINE YELLOW 10/07/2016 0205   APPEARANCEUR HAZY (A) 10/07/2016 0205   LABSPEC 1.027 10/07/2016 0205   PHURINE 5.0 10/07/2016 0205   GLUCOSEU NEGATIVE 10/07/2016 0205   HGBUR NEGATIVE 10/07/2016 0205   BILIRUBINUR NEGATIVE 10/07/2016 0205   BILIRUBINUR neg 05/23/2016 1424   KETONESUR NEGATIVE 10/07/2016 0205   PROTEINUR NEGATIVE 10/07/2016 0205   UROBILINOGEN 0.2 05/23/2016 1424    UROBILINOGEN 0.2 04/14/2014 0410   NITRITE NEGATIVE 10/07/2016 0205   LEUKOCYTESUR NEGATIVE 10/07/2016 0205   Sepsis Labs: @LABRCNTIP (procalcitonin:4,lacticidven:4) )No results found for this or any previous visit (from the past 240 hour(s)).   Radiological Exams on Admission: Dg Chest 2 View  Result Date: 10/06/2016 CLINICAL DATA:  61 year old female with shortness of breath. EXAM: CHEST  2 VIEW COMPARISON:  Chest CT dated 10/03/2016 FINDINGS: There are bibasilar streaky densities, left greater right corresponding to the ground-glass and reticular densities noted on the CT and possibly related to hypersensitivity pneumonitis. Developing infiltrate is not entirely excluded. There is no focal consolidation, pleural effusion, or pneumothorax. The cardiac silhouette is within normal limits. No acute osseous pathology identified. Right upper quadrant cholecystectomy clips noted. IMPRESSION: Bibasilar streaky densities, left greater right may represent hypersensitivity pneumonitis. Developing infiltrate not excluded. Clinical correlation is recommended. Electronically Signed   By: Anner Crete M.D.   On: 10/06/2016 23:40   Ct Angio Chest Pe W Or Wo Contrast  Result Date: 10/07/2016 CLINICAL DATA:  61 year old female with shortness of breath and positive D-dimer. EXAM: CT ANGIOGRAPHY CHEST WITH CONTRAST TECHNIQUE: Multidetector CT imaging of the chest was performed using the standard protocol during bolus administration of intravenous contrast. Multiplanar CT image reconstructions and MIPs were obtained to evaluate the vascular anatomy. CONTRAST:  100 cc Isovue 370 COMPARISON:  Chest radiograph dated 10/06/2016 and CT dated 10/03/2016 FINDINGS: Cardiovascular: There is mild cardiomegaly. There is enlargement of the right cardiac chamber compatible with right cardiac straining. The R weeks/LV diameter ratio is 4.7 cm/2.2 cm. There is no pericardial effusion. The thoracic aorta is unremarkable. The  origins of the great vessels of the aortic arch appear patent. There is pulmonary artery embolus involving the lobar and segmental branches of the right lower lobe as well as right upper lobe. Left upper lobe segmental pulmonary artery embolus is also noted. Evaluation is limited due to suboptimal opacification of the peripheral branches and streak artifact caused by patient's arms. Mediastinum/Nodes: There is no hilar or mediastinal adenopathy. Esophagus and the thyroid gland are grossly unremarkable. Lungs/Pleura: Bilateral streaky interstitial densities as seen on the  prior study primarily involving the lung bases may be combination of atelectasis/ scarring and underlying interstitial lung disease and hypersensitivity pneumonitis. Clinical correlation is recommended. There is no focal consolidation, pleural effusion, or pneumothorax. The central airways are patent. Upper Abdomen: No acute abnormality. Musculoskeletal: No chest wall abnormality. No acute or significant osseous findings. Review of the MIP images confirms the above findings. IMPRESSION: Positive for acute PE with CT evidence of right heart strain (RV/LV Ratio = 2) consistent with at least submassive (intermediate risk) PE. The presence of right heart strain has been associated with an increased risk of morbidity and mortality. Please activate Code PE by paging [phone]. These results were called by telephone at the time of interpretation on 10/07/2016 at 2:50 am to Dr. Arlean Hopping , who verbally acknowledged these results. Electronically Signed   By: Anner Crete M.D.   On: 10/07/2016 02:59    EKG: Independently reviewed. Normal sinus rhythm.  Assessment/Plan Principal Problem:   Pulmonary emboli (HCC) Active Problems:   HTN (hypertension)   OSA (obstructive sleep apnea)   ILD (interstitial lung disease) ([place])   Bilateral pulmonary embolism ([place])    1. Acute bilateral pulmonary embolism probably provoked with Premarin - presently  hemodynamically stable. Pulmonary consult was requested. Patient is on heparin infusion. We'll cycle cardiac markers check lactic acid levels 2-D echo. Monitor in stepdown. I advised patient to discontinue Premarin. Check Dopplers of the lower extremity. 2. Hypertension - on lisinopril and amlodipine. Closely monitor blood pressure trends. 3. Recently diagnosed interstitial lung disease - closely monitor for respiratory status. 4. Sleep apnea on CPAP. 5. Hypertriglyceridemia recently started on statins and fenofibrate.   DVT prophylaxis: Heparin infusion. Code Status: Full code.  Family Communication: Discussed with patient.  Disposition Plan: Home.  Consults called: Pulmonary.  Admission status: Inpatient.    Rise Patience MD [facility] Pager [phone].  If 7PM-7AM, please contact night-coverage [email or web address] Password [facility]  10/07/2016, 5:21 AM

## 2016-10-07 NOTE — Progress Notes (Signed)
Patient has home CPAP, patient had placed self on. Its their home machine, tubing and nasal mask, RT made pt aware that if she needed anything to call.

## 2016-10-07 NOTE — Consult Note (Signed)
PULMONARY / CRITICAL CARE MEDICINE   Name: Stacey Alvarez MRN: [id number] DOB: Jul 08, 1956    ADMISSION DATE:  10/06/2016 CONSULTATION DATE:  10/07/2016  REFERRING MD:  Caryl Asp, PA-C (EDP)  CHIEF COMPLAINT:  Dyspnea  HISTORY OF PRESENT ILLNESS:   61 year old woman with history of interstitial lung disease of unknown etiology, chronic hypoxic respiratory failure on 2L O2 prn, OSA, GERD, HTN, HLD presenting with dyspnea. At baseline, she has occasional dyspnea. She had worsening of her dyspnea 1 week ago, and she went to see Dr. Chase Caller on 1/19. Saturations on room air while ambulating were 84% She was started on 2L supplemental oxygen with exertion and prescribed doxycycline. Her dyspnea had improved some but then worsened last night. She has right upper back pain with coughing. She has a right rotator cuff repair done Nov 1. She denies fevers. She has some calf myalgias but no swelling. No recent travel/long car rides. Mother was on warfarin but she is not sure why.  She was diagnosed with ILD of unknown etiology in September 2017. She was to see a thoracic surgeon this week for lung biopsy. She has been on Premarin for years which has helped with her mood and hot flashes.   In the ED, her HR was 86 to 90. BP 115/97-163/100. O2 sat 95-99%. She was started on a heparin gtt.   PAST MEDICAL HISTORY :  She  has a past medical history of Allergy; Anxiety; Arthritis; Depression; Fibromyalgia; GERD (gastroesophageal reflux disease); Hyperlipidemia; Hypertension; IBS (irritable bowel syndrome); Interstitial lung disease ([place]); and Sleep apnea.  PAST SURGICAL HISTORY: She  has a past surgical history that includes Partial hysterectomy (1999); Cholecystectomy; Colonoscopy; and Polypectomy.  Allergies  Allergen Reactions  . Prednisone     pancreatitis  . Macrobid [Nitrofurantoin Monohyd Macro] Diarrhea    Diarrhea, GI upset  . Sulfa Antibiotics Rash    No current facility-administered medications on  file prior to encounter.    Current Outpatient Prescriptions on File Prior to Encounter  Medication Sig  . ALPRAZolam (XANAX) 1 MG tablet 0.5 mg in the am and 2mg  at night  . amLODipine (NORVASC) 5 MG tablet Take 1 tablet by mouth at  bedtime  . butalbital-aspirin-caffeine-codeine (FIORINAL WITH CODEINE) 50-325-40-30 MG capsule TAKE 1 CAPSULE BY MOUTH EVERY 6 HOURS AS NEEDED FOR PAIN  . cetirizine (ZYRTEC) 10 MG tablet Take 10 mg by mouth daily.  . fenofibrate 160 MG tablet TAKE 1 TABLET BY MOUTH  DAILY  . FLUoxetine (PROZAC) 20 MG tablet Take 40 mg by mouth daily.  . fluticasone (FLONASE) 50 MCG/ACT nasal spray Use 2 sprays in each  nostril daily  . HYDROcodone-acetaminophen (NORCO) 10-325 MG tablet Take 1 tablet by mouth every 6 (six) hours as needed.  . lamoTRIgine (LAMICTAL) 100 MG tablet Take 100 mg by mouth daily.  Marland Kitchen lisinopril (PRINIVIL,ZESTRIL) 20 MG tablet TAKE 1 TABLET BY MOUTH TWO  TIMES DAILY  . pravastatin (PRAVACHOL) 20 MG tablet TAKE 1 TABLET(20 MG) BY MOUTH DAILY  . PREMARIN 0.625 MG tablet TAKE 1 TABLET BY MOUTH  (0.625MG ) DAILY  . promethazine (PHENERGAN) 25 MG tablet Take 1 tablet (25 mg total) by mouth every 8 (eight) hours as needed for nausea or vomiting.  . ranitidine (ZANTAC) 150 MG tablet Take 1 tablet (150 mg total) by mouth 2 (two) times daily.  . temazepam (RESTORIL) 30 MG capsule Take 30 mg by mouth at bedtime.  Marland Kitchen acetaminophen-codeine (TYLENOL #3) 300-30 MG tablet Take 1 tablet by mouth as  needed.   . doxycycline (VIBRA-TABS) 100 MG tablet Take 1 tablet, twice daily for 5 days. Avoid sunlight and take after meals. (Patient not taking: Reported on 10/06/2016)  . methocarbamol (ROBAXIN) 500 MG tablet Take 500 mg by mouth 2 (two) times daily.    FAMILY HISTORY:  Her indicated that her mother is deceased. She indicated that her father is deceased. She indicated that her sister is alive. She indicated that the status of her paternal grandmother is unknown. She  indicated that the status of her son is unknown.    SOCIAL HISTORY: She  reports that she has quit smoking. She has never used smokeless tobacco. She reports that she drinks alcohol. She reports that she does not use drugs.  REVIEW OF SYSTEMS:   11 point ROS negative except per HPI and below +Occasional dry cough, occasional epistaxis +chronic arthralgias   SUBJECTIVE:  Dyspnea improved  VITAL SIGNS: BP 156/74   Pulse 88   Temp 98.4 F (36.9 C) (Rectal)   Resp 18   Ht 5\' 4"  (1.626 m)   Wt 240 lb (108.9 kg)   SpO2 99%   BMI 41.20 kg/m   INTAKE / OUTPUT: No intake/output data recorded.  PHYSICAL EXAMINATION: General:  NAD Neuro:  Awake, alert, oriented x 3. No focal deficits HEENT:  [place]/AT, PERRL bilaterally, [place] in place Cardiovascular:  RRR, no m/r/g Lungs:  Clear bilaterally Abdomen:  Soft, nontender, nondistended Musculoskeletal:  No LE edema, nontender calves Skin:  Warm and well perfused  LABS:  BMET  Recent Labs Lab 10/06/16 2207  NA 138  K 3.8  CL 102  CO2 24  BUN 10  CREATININE 0.77  GLUCOSE 129*    Electrolytes  Recent Labs Lab 10/06/16 2207  CALCIUM 9.7    CBC  Recent Labs Lab 10/06/16 2207  WBC 7.7  HGB 12.9  HCT 38.7  PLT 247    Coag's No results for input(s): APTT, INR in the last 168 hours.  Sepsis Markers  Recent Labs Lab 10/07/16 0042  LATICACIDVEN 1.66    ABG  Recent Labs Lab 10/07/16 0058  PHART 7.416  PCO2ART 38.9  PO2ART 94.0    Liver Enzymes  Recent Labs Lab 10/06/16 2207  AST 14*  ALT 9*  ALKPHOS 62  BILITOT 0.3  ALBUMIN 3.6    Imaging Dg Chest 2 View  Result Date: 10/06/2016 CLINICAL DATA:  61 year old female with shortness of breath. EXAM: CHEST  2 VIEW COMPARISON:  Chest CT dated 10/03/2016 FINDINGS: There are bibasilar streaky densities, left greater right corresponding to the ground-glass and reticular densities noted on the CT and possibly related to hypersensitivity pneumonitis.  Developing infiltrate is not entirely excluded. There is no focal consolidation, pleural effusion, or pneumothorax. The cardiac silhouette is within normal limits. No acute osseous pathology identified. Right upper quadrant cholecystectomy clips noted. IMPRESSION: Bibasilar streaky densities, left greater right may represent hypersensitivity pneumonitis. Developing infiltrate not excluded. Clinical correlation is recommended. Electronically Signed   By: Anner Crete M.D.   On: 10/06/2016 23:40   Ct Angio Chest Pe W Or Wo Contrast  Result Date: 10/07/2016 CLINICAL DATA:  61 year old female with shortness of breath and positive D-dimer. EXAM: CT ANGIOGRAPHY CHEST WITH CONTRAST TECHNIQUE: Multidetector CT imaging of the chest was performed using the standard protocol during bolus administration of intravenous contrast. Multiplanar CT image reconstructions and MIPs were obtained to evaluate the vascular anatomy. CONTRAST:  100 cc Isovue 370 COMPARISON:  Chest radiograph dated 10/06/2016 and CT  dated 10/03/2016 FINDINGS: Cardiovascular: There is mild cardiomegaly. There is enlargement of the right cardiac chamber compatible with right cardiac straining. The R weeks/LV diameter ratio is 4.7 cm/2.2 cm. There is no pericardial effusion. The thoracic aorta is unremarkable. The origins of the great vessels of the aortic arch appear patent. There is pulmonary artery embolus involving the lobar and segmental branches of the right lower lobe as well as right upper lobe. Left upper lobe segmental pulmonary artery embolus is also noted. Evaluation is limited due to suboptimal opacification of the peripheral branches and streak artifact caused by patient's arms. Mediastinum/Nodes: There is no hilar or mediastinal adenopathy. Esophagus and the thyroid gland are grossly unremarkable. Lungs/Pleura: Bilateral streaky interstitial densities as seen on the prior study primarily involving the lung bases may be combination of  atelectasis/ scarring and underlying interstitial lung disease and hypersensitivity pneumonitis. Clinical correlation is recommended. There is no focal consolidation, pleural effusion, or pneumothorax. The central airways are patent. Upper Abdomen: No acute abnormality. Musculoskeletal: No chest wall abnormality. No acute or significant osseous findings. Review of the MIP images confirms the above findings. IMPRESSION: Positive for acute PE with CT evidence of right heart strain (RV/LV Ratio = 2) consistent with at least submassive (intermediate risk) PE. The presence of right heart strain has been associated with an increased risk of morbidity and mortality. Please activate Code PE by paging [phone]. These results were called by telephone at the time of interpretation on 10/07/2016 at 2:50 am to Dr. Arlean Hopping , who verbally acknowledged these results. Electronically Signed   By: Anner Crete M.D.   On: 10/07/2016 02:59     STUDIES:  CXR 1/22 >> Bibasilar densities CTA chest 1/22 >> Acute PE, RV/LV ratio 2  CULTURES: Blood cx 1/22>> Respiratory panel 1/22>> Urine cx 1/22>>  ANTIBIOTICS: None  SIGNIFICANT EVENTS: 1/22 Admitted  LINES/TUBES: PIV  DISCUSSION: 61 year old woman with history of interstitial lung disease of unknown etiology, chronic hypoxic respiratory failure on 2L O2 prn, OSA, GERD, HTN, HLD presenting with dyspnea found to have acute PE on CTA chest and CT evidence of right heart strain.   ASSESSMENT / PLAN:  Acute Intermediate Risk Pulmonary Embolism: Possibly provoked in setting of rotator cuff surgery. Presently hemodynamically stable RV/LV ratio 2, troponin neg -Cardiac monitoring. -Continue conservative therapy with heparin gtt. -If decompensates, will need to consider thrombolytic therapy -TTE, troponins, LE duplex -Supplemental oxygen to maintain O2 sat >92%  Jacques Earthly, MD  Internal Medicine PGY-3 Pager: [phone] 10/07/2016, 4:11 AM

## 2016-10-07 NOTE — ED Notes (Signed)
Went in to check on pt and heparin bag was disconnected and empty.  I could not see an order for discontinue heparin.  Called pharmacy and they stated no discontinue order and they are going to bring another bag of heparin.

## 2016-10-07 NOTE — Progress Notes (Addendum)
PROGRESS NOTE        PATIENT DETAILS Name: Stacey Alvarez Age: 61 y.o. Sex: female Date of Birth: Dec 26, 1955 Admit Date: 10/06/2016 Admitting Physician No admitting provider for patient encounter. [id number] Birdie Riddle, MD   Note prepared by PA student Ms Clousing. Physical exam, assessment and plan discussed in detail and agree with documentation outlined below.  Brief Narrative: Patient is a 60 y.o. female with a PMH significant for new diagnosis of interstitial lung disease, HTN, sleep apnea, and HLD. She presented to the ER yesterday for SOB that progressed over 1 week. CT angiogram showed evidence of bilateral pulmonary embolism. She was admitted this morning for further management, lactic acid was elevated to 2.6 this morning. See details below.    Subjective: Patient appears well and in pleasant mood this morning. She is married, retired, and has one son with cystic fibrosis. She used the commode this morning, and became mildly SOB. Denies chest discomfort, and abdominal pain. Denies noticing blood in urine or stool.     Nurse notified that her heparin bag was empty this morning much earlier that due. Pharmacy contacted, placed back on heparin drip, level suprahepatic.   Assessment/Plan:  Acute respiratory failure: Due to bilateral PE, see details below. CXR on 10/06/16 shows no sign of consolidation, pleural effusion or pneumothorax. Continue on O2 2L nasal cannula, wean off as tolerated.  Admit to stepdown.   Bilateral pulmonary embolism :  Possibly due to h/o Premarin use and recent sedentary lifestyle following rotator cuff surgery.  CT angiogram found PE involving the lobar and segmental branches of the RLL, RUL, and LUL. LE doppler prelim shows b/l  acute deep vein thrombosis in the right and left posterior tibial veins and peroneal veins. Continue heparin drip. Check 2d echo to r/o RV strain. PCCM consult appreciated. doesnot need thrombolysis at this  time.  If stable , will transition to NOAC in am ( discussed with pt who wants NOAC). Plan to treat for at least 6 months.  Elevated troponin level  suspect demand ischemia. EKG unremarkable. No chest pain symptoms.  Follow 2d echo for wall motion abnormality.  ILD (interstitial lung disease):  Newly diagnosed. PFT completed on 10/04/16 by Dr. Chase Caller. Will need to reschedule lung biopsy due to current anticoagulation tx. Continue O2 2L therapy, wean off as tolerated.   HTN (hypertension):  Compensated. Continue lisinopril, amlodipine, and statin( was switched from crestor to pravachol recently for myalgia and is tolerating well).    OSA (obstructive sleep apnea):  Continue CPAP   Elevated lactic acid:  Possibly due to acute illness. No signs of sepsis. Monitor.  Anxiety/Depression: Continue alprazolam and lamotrigine.   DVT Prophylaxis: Full dose anticoagulation with Heparin  Code Status: Full code  Family Communication: None  Disposition Plan: Inpatient  Antimicrobial agents: Anti-infectives    Start     Dose/Rate Route Frequency Ordered Stop   10/07/16 0100  vancomycin (VANCOCIN) 2,000 mg in sodium chloride 0.9 % 500 mL IVPB     2,000 mg 250 mL/hr over 120 Minutes Intravenous  Once 10/07/16 0050 10/07/16 0626   10/07/16 0100  piperacillin-tazobactam (ZOSYN) IVPB 3.375 g     3.375 g 100 mL/hr over 30 Minutes Intravenous  Once 10/07/16 0050 10/07/16 0210      Procedures: Echocardiogram today, pending results.    LE Doppler, pending results.   CONSULTS:  pharmacy  PCCM  Time spent: 25 minutes-Greater than 50% of this time was spent in counseling, explanation of diagnosis, planning of further management, and coordination of care.  MEDICATIONS: Scheduled Meds: . ALPRAZolam  0.5 mg Oral Daily  . ALPRAZolam  2 mg Oral QHS  . amLODipine  5 mg Oral QHS  . famotidine  20 mg Oral Daily  . fenofibrate  160 mg Oral Daily  . FLUoxetine  40 mg Oral Daily  .  fluticasone  2 spray Each Nare Daily  . lamoTRIgine  100 mg Oral Daily  . lisinopril  20 mg Oral BID  . loratadine  10 mg Oral Daily  . methocarbamol  500 mg Oral BID  . pravastatin  20 mg Oral Daily  . temazepam  30 mg Oral QHS   Continuous Infusions: . sodium chloride 75 mL/hr at 10/07/16 0638  . heparin 1,500 Units/hr (10/07/16 0928)   PRN Meds:.acetaminophen **OR** acetaminophen, HYDROcodone-acetaminophen, ondansetron **OR** ondansetron (ZOFRAN) IV   PHYSICAL EXAM: Vital signs: Vitals:   10/07/16 0945 10/07/16 0952 10/07/16 1000 10/07/16 1015  BP: 107/92 107/92 138/84 154/80  Pulse:  90    Resp: 17 20 17 22   Temp:      TempSrc:      SpO2:  99%    Weight:      Height:       Filed Weights   10/06/16 2142  Weight: 108.9 kg (240 lb)   Body mass index is 41.2 kg/m.   General appearance:Awake, alert, not in any distress. Speech clear.  Eyes: PERRLA HEENT: Atraumatic and normocephalic Neck: Supple, mild bilateral JVD elevation on inspection.  Resp:Good air entry bilaterally. Auscultation revealed diffuse crackles on RLL and RML. Left lung clear to ausculation.   CVS: S1 S2 regular.  GI: Bowel sounds present, Non tender and not distended with no gaurding, rigidity or rebound. Extremities: B/L Lower Ext shows no edema, both legs are warm to touch Psychiatric: Normal judgment and insight. AAOx 3. Normal mood. Musculoskeletal:No digital cyanosis Skin:No rash, warm and dry  I have personally reviewed following labs and imaging studies  LABORATORY DATA: CBC:  Recent Labs Lab 10/06/16 2207 10/07/16 0541  WBC 7.7 6.9  NEUTROABS 4.7 5.8  HGB 12.9 11.7*  HCT 38.7 36.1  MCV 80.6 81.1  PLT 247 [id number]    Basic Metabolic Panel:  Recent Labs Lab 10/06/16 2207 10/07/16 0541  NA 138 141  K 3.8 4.0  CL 102 109  CO2 24 20*  GLUCOSE 129* 201*  BUN 10 10  CREATININE 0.77 0.77  CALCIUM 9.7 8.3*    GFR: Estimated Creatinine Clearance: 87.9 mL/min (by C-G formula  based on SCr of 0.77 mg/dL).  Liver Function Tests:  Recent Labs Lab 10/06/16 2207 10/07/16 0541  AST 14* 16  ALT 9* 9*  ALKPHOS 62 53  BILITOT 0.3 0.4  PROT 6.6 6.2*  ALBUMIN 3.6 3.3*   No results for input(s): LIPASE, AMYLASE in the last 168 hours. No results for input(s): AMMONIA in the last 168 hours.  Coagulation Profile: No results for input(s): INR, PROTIME in the last 168 hours.  Cardiac Enzymes:  Recent Labs Lab 10/07/16 0541  TROPONINI <0.03    BNP (last 3 results) No results for input(s): PROBNP in the last 8760 hours.  HbA1C: No results for input(s): HGBA1C in the last 72 hours.  CBG: No results for input(s): GLUCAP in the last 168 hours.  Lipid Profile: No results for input(s): CHOL, HDL, LDLCALC, TRIG, CHOLHDL,  LDLDIRECT in the last 72 hours.  Thyroid Function Tests: No results for input(s): TSH, T4TOTAL, FREET4, T3FREE, THYROIDAB in the last 72 hours.  Anemia Panel: No results for input(s): VITAMINB12, FOLATE, FERRITIN, TIBC, IRON, RETICCTPCT in the last 72 hours.  Urine analysis:    Component Value Date/Time   COLORURINE YELLOW 10/07/2016 0205   APPEARANCEUR HAZY (A) 10/07/2016 0205   LABSPEC 1.027 10/07/2016 0205   PHURINE 5.0 10/07/2016 0205   GLUCOSEU NEGATIVE 10/07/2016 0205   HGBUR NEGATIVE 10/07/2016 0205   BILIRUBINUR NEGATIVE 10/07/2016 0205   BILIRUBINUR neg 05/23/2016 1424   KETONESUR NEGATIVE 10/07/2016 0205   PROTEINUR NEGATIVE 10/07/2016 0205   UROBILINOGEN 0.2 05/23/2016 1424   UROBILINOGEN 0.2 04/14/2014 0410   NITRITE NEGATIVE 10/07/2016 0205   LEUKOCYTESUR NEGATIVE 10/07/2016 0205    Sepsis Labs: Lactic Acid, Venous    Component Value Date/Time   LATICACIDVEN 2.6 ([place]) 10/07/2016 0541    MICROBIOLOGY: No results found for this or any previous visit (from the past 240 hour(s)).  RADIOLOGY STUDIES/RESULTS: Dg Chest 2 View  Result Date: 10/06/2016 CLINICAL DATA:  61 year old female with shortness of breath.  EXAM: CHEST  2 VIEW COMPARISON:  Chest CT dated 10/03/2016 FINDINGS: There are bibasilar streaky densities, left greater right corresponding to the ground-glass and reticular densities noted on the CT and possibly related to hypersensitivity pneumonitis. Developing infiltrate is not entirely excluded. There is no focal consolidation, pleural effusion, or pneumothorax. The cardiac silhouette is within normal limits. No acute osseous pathology identified. Right upper quadrant cholecystectomy clips noted. IMPRESSION: Bibasilar streaky densities, left greater right may represent hypersensitivity pneumonitis. Developing infiltrate not excluded. Clinical correlation is recommended. Electronically Signed   By: Anner Crete M.D.   On: 10/06/2016 23:40   Ct Angio Chest Pe W Or Wo Contrast  Result Date: 10/07/2016 CLINICAL DATA:  61 year old female with shortness of breath and positive D-dimer. EXAM: CT ANGIOGRAPHY CHEST WITH CONTRAST TECHNIQUE: Multidetector CT imaging of the chest was performed using the standard protocol during bolus administration of intravenous contrast. Multiplanar CT image reconstructions and MIPs were obtained to evaluate the vascular anatomy. CONTRAST:  100 cc Isovue 370 COMPARISON:  Chest radiograph dated 10/06/2016 and CT dated 10/03/2016 FINDINGS: Cardiovascular: There is mild cardiomegaly. There is enlargement of the right cardiac chamber compatible with right cardiac straining. The R weeks/LV diameter ratio is 4.7 cm/2.2 cm. There is no pericardial effusion. The thoracic aorta is unremarkable. The origins of the great vessels of the aortic arch appear patent. There is pulmonary artery embolus involving the lobar and segmental branches of the right lower lobe as well as right upper lobe. Left upper lobe segmental pulmonary artery embolus is also noted. Evaluation is limited due to suboptimal opacification of the peripheral branches and streak artifact caused by patient's arms.  Mediastinum/Nodes: There is no hilar or mediastinal adenopathy. Esophagus and the thyroid gland are grossly unremarkable. Lungs/Pleura: Bilateral streaky interstitial densities as seen on the prior study primarily involving the lung bases may be combination of atelectasis/ scarring and underlying interstitial lung disease and hypersensitivity pneumonitis. Clinical correlation is recommended. There is no focal consolidation, pleural effusion, or pneumothorax. The central airways are patent. Upper Abdomen: No acute abnormality. Musculoskeletal: No chest wall abnormality. No acute or significant osseous findings. Review of the MIP images confirms the above findings. IMPRESSION: Positive for acute PE with CT evidence of right heart strain (RV/LV Ratio = 2) consistent with at least submassive (intermediate risk) PE. The presence of right heart strain has  been associated with an increased risk of morbidity and mortality. Please activate Code PE by paging [phone]. These results were called by telephone at the time of interpretation on 10/07/2016 at 2:50 am to Dr. Arlean Hopping , who verbally acknowledged these results. Electronically Signed   By: Anner Crete M.D.   On: 10/07/2016 02:59   Ct Chest High Resolution  Result Date: 10/03/2016 CLINICAL DATA:  Worsening dyspnea for 2 weeks. Follow-up prior high-resolution chest CT findings suggestive of interstitial lung disease. EXAM: CT CHEST WITHOUT CONTRAST TECHNIQUE: Multidetector CT imaging of the chest was performed following the standard protocol without intravenous contrast. High resolution imaging of the lungs, as well as inspiratory and expiratory imaging, was performed. COMPARISON:  04/10/2016 high-resolution chest CT. FINDINGS: Cardiovascular: Top-normal heart size. No significant pericardial fluid/thickening. Left anterior descending coronary atherosclerosis. Mildly atherosclerotic nonaneurysmal thoracic aorta. Normal caliber pulmonary arteries (main  pulmonary artery diameter 2.9 cm). Mediastinum/Nodes: Hypodense 0.4 cm posterior right thyroid lobe nodule. Unremarkable esophagus. No pathologically enlarged axillary, mediastinal or gross hilar lymph nodes, noting limited sensitivity for the detection of hilar adenopathy on this noncontrast study. Lungs/Pleura: No pneumothorax. No pleural effusion. Subpleural posteromedial left lower lobe 4 mm solid pulmonary nodule (series 8/ image 55), stable since 04/10/2016, considered benign. No acute consolidative airspace disease, lung masses or new significant pulmonary nodules. There is patchy ground-glass attenuation and reticulation involving the peribronchovascular and subpleural lungs bilaterally, slightly asymmetrically more prominent in the left lung. There is associated mild parenchymal distortion and mild traction bronchiectasis throughout both lungs. Areas of involvement of the posterior lungs persist on the prone sequences. There is no clear basilar gradient. There is no frank honeycombing. These findings have progressed since 04/10/2016. There is mild patchy air trapping throughout both lungs on the expiration sequence. Upper abdomen: Cholecystectomy. Musculoskeletal: No aggressive appearing focal osseous lesions. Moderate thoracic spondylosis. IMPRESSION: 1. Interval progression of fibrotic interstitial lung disease characterized by peribronchovascular and subpleural ground-glass attenuation and reticulation with associated mild parenchymal distortion and mild traction bronchiectasis. Mild patchy air trapping. No frank honeycombing. No clear basilar gradient. This spectrum of findings is most consistent with chronic hypersensitivity pneumonitis. Progression of findings is not typical of fibrotic nonspecific interstitial pneumonia (NSIP). These findings are not consistent with usual interstitial pneumonia (UIP). 2. Aortic atherosclerosis.  One vessel coronary atherosclerosis. Electronically Signed   By: Ilona Sorrel M.D.   On: 10/03/2016 14:00     LOS: 0 days   [facility]  If 7PM-7AM, please contact night-coverage [email or web address] Password [facility] 10/07/2016, 10:37 AM

## 2016-10-07 NOTE — Telephone Encounter (Signed)
Spoke with pt's spouse Quita Skye (dpr on file), states that pt is currently hospitalized for b/l PE.  Made aware of MR"s recs below.   Forwarding to MR as FYI.

## 2016-10-07 NOTE — Progress Notes (Signed)
  Echocardiogram 2D Echocardiogram has been performed.  Jamorian Dimaria L Androw 10/07/2016, 3:22 PM

## 2016-10-07 NOTE — ED Notes (Signed)
Patient transported to CT 

## 2016-10-07 NOTE — Progress Notes (Signed)
Preliminary results by tech - Venous Duplex Lower Ext. Completed. Positive for acute deep vein thrombosis in the right and left posterior tibial veins and peroneal veins. Results given to Dr. Verner Chol. Oda Cogan, BS, RDMS, RVT

## 2016-10-07 NOTE — ED Notes (Addendum)
Pt assisted up and to a bedside commode. Pt very short of breath. RN notified

## 2016-10-07 NOTE — Progress Notes (Signed)
ANTICOAGULATION CONSULT NOTE - Initial Consult  Pharmacy Consult for Heparin  Indication: pulmonary embolus, new onset  Allergies  Allergen Reactions  . Prednisone     pancreatitis  . Macrobid [Nitrofurantoin Monohyd Macro] Diarrhea    Diarrhea, GI upset  . Sulfa Antibiotics Rash   Patient Measurements: Height: 5\' 4"  (162.6 cm) Weight: 240 lb (108.9 kg) IBW/kg (Calculated) : 54.7  Vital Signs: Temp: 98.4 F (36.9 C) (01/22 0105) Temp Source: Rectal (01/22 0105) BP: 156/74 (01/22 0215) Pulse Rate: 88 (01/22 0215)  Labs:  Recent Labs  10/06/16 2207  HGB 12.9  HCT 38.7  PLT 247  CREATININE 0.77    Estimated Creatinine Clearance: 87.9 mL/min (by C-G formula based on SCr of 0.77 mg/dL).   Medical History: Past Medical History:  Diagnosis Date  . Allergy   . Anxiety   . Arthritis    KNEES,BACK,HIPS  . Depression   . Fibromyalgia   . GERD (gastroesophageal reflux disease)   . Hyperlipidemia   . Hypertension   . IBS (irritable bowel syndrome)   . Interstitial lung disease ([place])   . Sleep apnea    Assessment: Heparin for new onset PE, pt with ?idiopathic ILD, here with shortness of breath, CT angio with PE, CBC/renal function ok, PTA meds reviewed.   Goal of Therapy:  Heparin level 0.3-0.7 units/ml Monitor platelets by anticoagulation protocol: Yes   Plan:  Heparin 5000 units BOLUS Start heparin drip at 1500 units/hr 1200 HL Daily CBC/HL Monitor for bleeding   Narda Bonds 10/07/2016,3:07 AM

## 2016-10-07 NOTE — ED Notes (Signed)
Pt assisted with bedpan, pt had a large void.

## 2016-10-07 NOTE — Progress Notes (Signed)
CRITICAL VALUE ALERT  Critical value received:  Trop 0.08  Date of notification:  10/07/2016  Time of notification:  1200  Critical value read back:Yes.    Nurse who received alert:  Clint Lipps RN  MD notified (1st page):  Dr. Clementeen Graham  Time of first page:  1202  MD notified (2nd page):  Time of second page:  Responding MD:  Dr. Clementeen Graham  Time MD responded:  [phone]

## 2016-10-07 NOTE — Progress Notes (Signed)
[place] for Heparin  Indication: pulmonary embolus, new onset  Allergies  Allergen Reactions  . Prednisone     pancreatitis  . Macrobid [Nitrofurantoin Monohyd Macro] Diarrhea    Diarrhea, GI upset  . Sulfa Antibiotics Rash   Patient Measurements: Height: 5\' 4"  (162.6 cm) Weight: 235 lb 14.3 oz (107 kg) IBW/kg (Calculated) : 54.7  Vital Signs: Temp: 97.9 F (36.6 C) (01/22 1634) Temp Source: Oral (01/22 1634) BP: 115/72 (01/22 1634) Pulse Rate: 81 (01/22 1634)  Labs:  Recent Labs  10/06/16 2207 10/07/16 0541 10/07/16 1002 10/07/16 1744  HGB 12.9 11.7*  --   --   HCT 38.7 36.1  --   --   PLT 247 219  --   --   HEPARINUNFRC  --   --  0.93* 0.25*  CREATININE 0.77 0.77  --   --   TROPONINI  --  <0.03 0.08* <0.03    Estimated Creatinine Clearance: 87 mL/min (by C-G formula based on SCr of 0.77 mg/dL).   Assessment: Heparin for new onset PE, pt with ?idiopathic ILD, here with shortness of breath, CT angio with PE, CBC/renal function ok, PTA meds reviewed.   Heparin level = 0.25 on 1300 units/hr.  No bleeding or complications noted.  Goal of Therapy:  Heparin level 0.3-0.7 units/ml Monitor platelets by anticoagulation protocol: Yes   Plan:  Increase IV heparin to 1400 units/hr. Recheck level in 6 hrs. Daily heparin level and CBC.  Uvaldo Rising, BCPS  Clinical Pharmacist Pager [phone]  10/07/2016 7:01 PM

## 2016-10-07 NOTE — ED Notes (Signed)
Pt assisted up to bedside commode, pt complains of shortness of breath with movement, RN notified.

## 2016-10-07 NOTE — ED Notes (Signed)
Spoke with Dr Claretta Fraise and he stated to start the heparin drip while we wait to see if pt needs another bolus.

## 2016-10-08 ENCOUNTER — Telehealth: Payer: Self-pay | Admitting: Internal Medicine

## 2016-10-08 ENCOUNTER — Ambulatory Visit: Payer: BLUE CROSS/BLUE SHIELD | Admitting: Nurse Practitioner

## 2016-10-08 DIAGNOSIS — I10 Essential (primary) hypertension: Secondary | ICD-10-CM

## 2016-10-08 DIAGNOSIS — I2699 Other pulmonary embolism without acute cor pulmonale: Principal | ICD-10-CM

## 2016-10-08 DIAGNOSIS — I2609 Other pulmonary embolism with acute cor pulmonale: Secondary | ICD-10-CM

## 2016-10-08 LAB — CBC
HEMATOCRIT: 30.5 % — AB (ref 36.0–46.0)
Hemoglobin: 10.2 g/dL — ABNORMAL LOW (ref 12.0–15.0)
MCH: 27 pg (ref 26.0–34.0)
MCHC: 33.4 g/dL (ref 30.0–36.0)
MCV: 80.7 fL (ref 78.0–100.0)
Platelets: 223 10*3/uL (ref 150–400)
RBC: 3.78 MIL/uL — ABNORMAL LOW (ref 3.87–5.11)
RDW: 14 % (ref 11.5–15.5)
WBC: 9.3 10*3/uL (ref 4.0–10.5)

## 2016-10-08 LAB — URINE CULTURE: Culture: <10000 — AB

## 2016-10-08 LAB — HEPARIN LEVEL (UNFRACTIONATED)
Heparin Unfractionated: 0.23 IU/mL — ABNORMAL LOW (ref 0.30–0.70)
Heparin Unfractionated: 0.29 IU/mL — ABNORMAL LOW (ref 0.30–0.70)
Heparin Unfractionated: 0.57 IU/mL (ref 0.30–0.70)

## 2016-10-08 MED ORDER — HEPARIN BOLUS VIA INFUSION
2000.0000 [IU] | Freq: Once | INTRAVENOUS | Status: AC
  Administered 2016-10-08: 2000 [IU] via INTRAVENOUS
  Filled 2016-10-08: qty 2000

## 2016-10-08 MED ORDER — RIVAROXABAN 20 MG PO TABS: 20.0000 mg | ORAL_TABLET | Freq: Every day | ORAL | Status: DC

## 2016-10-08 MED ORDER — RIVAROXABAN 15 MG PO TABS
15.0000 mg | ORAL_TABLET | Freq: Two times a day (BID) | ORAL | Status: DC
  Administered 2016-10-08: 15 mg via ORAL
  Filled 2016-10-08: qty 1

## 2016-10-08 NOTE — Progress Notes (Signed)
Courtesy with [employer] . Also spoke to husband yesterday  - discussed if opportunity to diagnose PE was missed durig her recent visits and if all symptoms were due to PE v ILD getting worse and with obesity, premarin and shoulder surgery PE developed over weekend. Unclear. Nevertheless, she is appreciative of candor and my visit.   - FU   - cancel ILD bx for now - do office visit in 1 week with nurse practitioer - wll need walk test in office  - then again with me in 3 months   - if clnicaly stable/improving will do HRCT in 4 months or so   Dr. Brand Males, M.D., [facility].C.P Pulmonary and Critical Care Medicine Staff Physician [facility] Pager: [phone], If no answer or between  15:00h - 7:00h: call [phone]  10/08/2016 11:15 PM

## 2016-10-08 NOTE — Procedures (Signed)
Patient has home CPAP bedside and ready for use.  She stated she will place on herself when ready for bed and does not need any assistance.

## 2016-10-08 NOTE — Progress Notes (Addendum)
PROGRESS NOTE        PATIENT DETAILS Name: Stacey Alvarez Age: 61 y.o. Sex: female Date of Birth: 20-Apr-1956 Admit Date: 10/06/2016 Admitting Physician Rise Patience, MD NE:[id number] Birdie Riddle, MD   Note prepared by PA student s Clousing. Physical exam, assessment and plan discussed in detail and reviewed as outlined below.  Brief Narrative: Patient is a 61 y.o. female newly diagnosed with ILD. PMH is significant for HTN, OBSA, and HLD. She presented to the ER on Sunday for SOB that had progressed in severity over 1 week. CT angiogram showed evidence of bilateral pulmonary embolism. She was admitted yesterday morning for further management. See details below  Subjective: Patient appears well and in pleasant mood this morning. She denies chest discomfort, and abdominal pain.   Assessment/Plan:  Acute respiratory failure:  Due to bilateral PE , see details below. CXR on 10/06/16 showed no signs of consolidation, pleural effusion or pneumothorax. Maintaining sats on RA, check sats and HR with ambulation.   Bilateral Pulmonary emboli:  Due to h/o Premarin use, and recent sedentary lifestyle due to rotator cuff surgery in November. D/C premarin.  CT angiogram found PE involving the lobar and segmental branches of the RLL, Rul, and LUL.  LE doppler final results showed b/l acute deep vein thrombosis in the right and left posterior tibial veins and peroneal veins.    2D echo found normal RV size and systolic function, and no pulmonary hypertension. No evidence of right sided strain. EF stable at 65-70%.   Pt. educated on anticoagulation options. Will start on xarelto upon discharge. Anticoagulation therapy will continue for at least 6 mo. Continue heparin drip until discharge, heparin level 0.29 this morning.   HTN (hypertension):  BP is 124/69. Echocardiogram found mild LV concentric hypertrophy with EF 65-70%, see details above.Pravastatin still being  tolerated well. Continue statin, lisinopril, amlodipine, and fenofibrate for BP and lipid control.   OSA (obstructive sleep apnea):  Stable, continue CPAP regime at night.   ILD (interstitial lung disease):  Newly diagnosed, lung biopsy delayed due to PE anticoagulation therapy. outpt follow up with pulmonary.  Anxiety / Depression:  Continue Xanax, Prozac, and Lamictal.   GERD:  Continue famotidine   HLD:  Continue fenofibrate tablet and statin therapy.    DVT Prophylaxis: Full dose anticoagulation with Heparin, start xarelto  Code Status: Full code  Family Communication: None  Disposition Plan: Remain inpatient today, if remain clinically stable discharge tomorrow.   Antimicrobial agents: Anti-infectives    Start     Dose/Rate Route Frequency Ordered Stop   10/07/16 0100  vancomycin (VANCOCIN) 2,000 mg in sodium chloride 0.9 % 500 mL IVPB     2,000 mg 250 mL/hr over 120 Minutes Intravenous  Once 10/07/16 0050 10/07/16 0626   10/07/16 0100  piperacillin-tazobactam (ZOSYN) IVPB 3.375 g     3.375 g 100 mL/hr over 30 Minutes Intravenous  Once 10/07/16 0050 10/07/16 0210      Procedures: None at this time  CONSULTS: None  Time spent: 30 minutes-Greater than 50% of this time was spent in counseling, explanation of diagnosis, planning of further management, and coordination of care.  MEDICATIONS: Scheduled Meds: . ALPRAZolam  0.5 mg Oral Daily  . ALPRAZolam  2 mg Oral QHS  . amLODipine  5 mg Oral QHS  . famotidine  20 mg Oral Daily  .  fenofibrate  160 mg Oral QHS  . FLUoxetine  40 mg Oral Daily  . fluticasone  2 spray Each Nare Daily  . lamoTRIgine  100 mg Oral Daily  . lisinopril  20 mg Oral BID  . loratadine  10 mg Oral Daily  . methocarbamol  500 mg Oral BID  . pravastatin  20 mg Oral Daily  . temazepam  30 mg Oral QHS   Continuous Infusions: . heparin 1,650 Units/hr (10/08/16 0245)   PRN Meds:.acetaminophen **OR** acetaminophen,  HYDROcodone-acetaminophen, ondansetron **OR** ondansetron (ZOFRAN) IV   PHYSICAL EXAM: Vital signs: Vitals:   10/08/16 0043 10/08/16 0457 10/08/16 0755 10/08/16 1138  BP: (!) 122/59 139/71 128/61 124/69  Pulse: 82 85 81 81  Resp: 18 16 15 18   Temp:  97.6 F (36.4 C) 97.8 F (36.6 C) 97.5 F (36.4 C)  TempSrc: Oral Oral Oral Oral  SpO2: 90% 92% 95% 94%  Weight:      Height:       Filed Weights   10/06/16 2142 10/07/16 1105  Weight: 108.9 kg (240 lb) 107 kg (235 lb 14.3 oz)   Body mass index is 40.49 kg/m.   General appearance: Awake, alert, not in any distress. Speech Clear. Not toxic Looking Eyes: PERRLA, no scleral icterus.Pink conjunctiva HEENT: Atraumatic and Normocephalic Neck: Supple, no JVD.   Resp:Good air entry bilaterally, no added sounds.   CVS: S1 S2 regular, no murmurs.  GI: Bowel sounds present, Non tender and not distended with no gaurding, rigidity or rebound. Extremities: B/L Lower Ext shows no edema, both legs are warm to touch.  Neurology:  Speech is clear. Psychiatric: Normal judgment and insight. AAO x 3. Normal mood. Musculoskeletal: No digital cyanosis Skin: No Rash, warm and dry Wounds: N/A  I have personally reviewed following labs and imaging studies  LABORATORY DATA: CBC:  Recent Labs Lab 10/06/16 2207 10/07/16 0541 10/08/16 0032  WBC 7.7 6.9 9.3  NEUTROABS 4.7 5.8  --   HGB 12.9 11.7* 10.2*  HCT 38.7 36.1 30.5*  MCV 80.6 81.1 80.7  PLT 247 219 [id number]    Basic Metabolic Panel:  Recent Labs Lab 10/06/16 2207 10/07/16 0541  NA 138 141  K 3.8 4.0  CL 102 109  CO2 24 20*  GLUCOSE 129* 201*  BUN 10 10  CREATININE 0.77 0.77  CALCIUM 9.7 8.3*    GFR: Estimated Creatinine Clearance: 87 mL/min (by C-G formula based on SCr of 0.77 mg/dL).  Liver Function Tests:  Recent Labs Lab 10/06/16 2207 10/07/16 0541  AST 14* 16  ALT 9* 9*  ALKPHOS 62 53  BILITOT 0.3 0.4  PROT 6.6 6.2*  ALBUMIN 3.6 3.3*   No results for  input(s): LIPASE, AMYLASE in the last 168 hours. No results for input(s): AMMONIA in the last 168 hours.  Coagulation Profile: No results for input(s): INR, PROTIME in the last 168 hours.  Cardiac Enzymes:  Recent Labs Lab 10/07/16 0541 10/07/16 1002 10/07/16 1744  TROPONINI <0.03 0.08* <0.03    BNP (last 3 results) No results for input(s): PROBNP in the last 8760 hours.  HbA1C: No results for input(s): HGBA1C in the last 72 hours.  CBG: No results for input(s): GLUCAP in the last 168 hours.  Lipid Profile: No results for input(s): CHOL, HDL, LDLCALC, TRIG, CHOLHDL, LDLDIRECT in the last 72 hours.  Thyroid Function Tests: No results for input(s): TSH, T4TOTAL, FREET4, T3FREE, THYROIDAB in the last 72 hours.  Anemia Panel: No results for input(s): VITAMINB12, FOLATE,  FERRITIN, TIBC, IRON, RETICCTPCT in the last 72 hours.  Urine analysis:    Component Value Date/Time   COLORURINE YELLOW 10/07/2016 0205   APPEARANCEUR HAZY (A) 10/07/2016 0205   LABSPEC 1.027 10/07/2016 0205   PHURINE 5.0 10/07/2016 0205   GLUCOSEU NEGATIVE 10/07/2016 0205   HGBUR NEGATIVE 10/07/2016 0205   BILIRUBINUR NEGATIVE 10/07/2016 0205   BILIRUBINUR neg 05/23/2016 1424   KETONESUR NEGATIVE 10/07/2016 0205   PROTEINUR NEGATIVE 10/07/2016 0205   UROBILINOGEN 0.2 05/23/2016 1424   UROBILINOGEN 0.2 04/14/2014 0410   NITRITE NEGATIVE 10/07/2016 0205   LEUKOCYTESUR NEGATIVE 10/07/2016 0205    Sepsis Labs: Lactic Acid, Venous    Component Value Date/Time   LATICACIDVEN 2.6 ([place]) 10/07/2016 0541    MICROBIOLOGY: Recent Results (from the past 240 hour(s))  Respiratory Panel by PCR     Status: None   Collection Time: 10/07/16 12:31 AM  Result Value Ref Range Status   Adenovirus NOT DETECTED NOT DETECTED Final   Coronavirus 229E NOT DETECTED NOT DETECTED Final   Coronavirus HKU1 NOT DETECTED NOT DETECTED Final   Coronavirus NL63 NOT DETECTED NOT DETECTED Final   Coronavirus OC43 NOT  DETECTED NOT DETECTED Final   Metapneumovirus NOT DETECTED NOT DETECTED Final   Rhinovirus / Enterovirus NOT DETECTED NOT DETECTED Final   Influenza A NOT DETECTED NOT DETECTED Final   Influenza B NOT DETECTED NOT DETECTED Final   Parainfluenza Virus 1 NOT DETECTED NOT DETECTED Final   Parainfluenza Virus 2 NOT DETECTED NOT DETECTED Final   Parainfluenza Virus 3 NOT DETECTED NOT DETECTED Final   Parainfluenza Virus 4 NOT DETECTED NOT DETECTED Final   Respiratory Syncytial Virus NOT DETECTED NOT DETECTED Final   Bordetella pertussis NOT DETECTED NOT DETECTED Final   Chlamydophila pneumoniae NOT DETECTED NOT DETECTED Final   Mycoplasma pneumoniae NOT DETECTED NOT DETECTED Final  Urine culture     Status: Abnormal   Collection Time: 10/07/16  2:05 AM  Result Value Ref Range Status   Specimen Description URINE, RANDOM  Final   Special Requests NONE  Final   Culture <10,000 COLONIES/mL INSIGNIFICANT GROWTH (A)  Final   Report Status 10/08/2016 FINAL  Final  MRSA PCR Screening     Status: None   Collection Time: 10/07/16 11:08 AM  Result Value Ref Range Status   MRSA by PCR NEGATIVE NEGATIVE Final    Comment:        The GeneXpert MRSA Assay (FDA approved for NASAL specimens only), is one component of a comprehensive MRSA colonization surveillance program. It is not intended to diagnose MRSA infection nor to guide or monitor treatment for MRSA infections.     RADIOLOGY STUDIES/RESULTS: Dg Chest 2 View  Result Date: 10/06/2016 CLINICAL DATA:  61 year old female with shortness of breath. EXAM: CHEST  2 VIEW COMPARISON:  Chest CT dated 10/03/2016 FINDINGS: There are bibasilar streaky densities, left greater right corresponding to the ground-glass and reticular densities noted on the CT and possibly related to hypersensitivity pneumonitis. Developing infiltrate is not entirely excluded. There is no focal consolidation, pleural effusion, or pneumothorax. The cardiac silhouette is within  normal limits. No acute osseous pathology identified. Right upper quadrant cholecystectomy clips noted. IMPRESSION: Bibasilar streaky densities, left greater right may represent hypersensitivity pneumonitis. Developing infiltrate not excluded. Clinical correlation is recommended. Electronically Signed   By: Anner Crete M.D.   On: 10/06/2016 23:40   Ct Angio Chest Pe W Or Wo Contrast  Result Date: 10/07/2016 CLINICAL DATA:  61 year old female with shortness  of breath and positive D-dimer. EXAM: CT ANGIOGRAPHY CHEST WITH CONTRAST TECHNIQUE: Multidetector CT imaging of the chest was performed using the standard protocol during bolus administration of intravenous contrast. Multiplanar CT image reconstructions and MIPs were obtained to evaluate the vascular anatomy. CONTRAST:  100 cc Isovue 370 COMPARISON:  Chest radiograph dated 10/06/2016 and CT dated 10/03/2016 FINDINGS: Cardiovascular: There is mild cardiomegaly. There is enlargement of the right cardiac chamber compatible with right cardiac straining. The R weeks/LV diameter ratio is 4.7 cm/2.2 cm. There is no pericardial effusion. The thoracic aorta is unremarkable. The origins of the great vessels of the aortic arch appear patent. There is pulmonary artery embolus involving the lobar and segmental branches of the right lower lobe as well as right upper lobe. Left upper lobe segmental pulmonary artery embolus is also noted. Evaluation is limited due to suboptimal opacification of the peripheral branches and streak artifact caused by patient's arms. Mediastinum/Nodes: There is no hilar or mediastinal adenopathy. Esophagus and the thyroid gland are grossly unremarkable. Lungs/Pleura: Bilateral streaky interstitial densities as seen on the prior study primarily involving the lung bases may be combination of atelectasis/ scarring and underlying interstitial lung disease and hypersensitivity pneumonitis. Clinical correlation is recommended. There is no focal  consolidation, pleural effusion, or pneumothorax. The central airways are patent. Upper Abdomen: No acute abnormality. Musculoskeletal: No chest wall abnormality. No acute or significant osseous findings. Review of the MIP images confirms the above findings. IMPRESSION: Positive for acute PE with CT evidence of right heart strain (RV/LV Ratio = 2) consistent with at least submassive (intermediate risk) PE. The presence of right heart strain has been associated with an increased risk of morbidity and mortality. Please activate Code PE by paging [phone]. These results were called by telephone at the time of interpretation on 10/07/2016 at 2:50 am to Dr. Arlean Hopping , who verbally acknowledged these results. Electronically Signed   By: Anner Crete M.D.   On: 10/07/2016 02:59   Ct Chest High Resolution  Result Date: 10/03/2016 CLINICAL DATA:  Worsening dyspnea for 2 weeks. Follow-up prior high-resolution chest CT findings suggestive of interstitial lung disease. EXAM: CT CHEST WITHOUT CONTRAST TECHNIQUE: Multidetector CT imaging of the chest was performed following the standard protocol without intravenous contrast. High resolution imaging of the lungs, as well as inspiratory and expiratory imaging, was performed. COMPARISON:  04/10/2016 high-resolution chest CT. FINDINGS: Cardiovascular: Top-normal heart size. No significant pericardial fluid/thickening. Left anterior descending coronary atherosclerosis. Mildly atherosclerotic nonaneurysmal thoracic aorta. Normal caliber pulmonary arteries (main pulmonary artery diameter 2.9 cm). Mediastinum/Nodes: Hypodense 0.4 cm posterior right thyroid lobe nodule. Unremarkable esophagus. No pathologically enlarged axillary, mediastinal or gross hilar lymph nodes, noting limited sensitivity for the detection of hilar adenopathy on this noncontrast study. Lungs/Pleura: No pneumothorax. No pleural effusion. Subpleural posteromedial left lower lobe 4 mm solid pulmonary  nodule (series 8/ image 55), stable since 04/10/2016, considered benign. No acute consolidative airspace disease, lung masses or new significant pulmonary nodules. There is patchy ground-glass attenuation and reticulation involving the peribronchovascular and subpleural lungs bilaterally, slightly asymmetrically more prominent in the left lung. There is associated mild parenchymal distortion and mild traction bronchiectasis throughout both lungs. Areas of involvement of the posterior lungs persist on the prone sequences. There is no clear basilar gradient. There is no frank honeycombing. These findings have progressed since 04/10/2016. There is mild patchy air trapping throughout both lungs on the expiration sequence. Upper abdomen: Cholecystectomy. Musculoskeletal: No aggressive appearing focal osseous lesions. Moderate thoracic spondylosis. IMPRESSION:  1. Interval progression of fibrotic interstitial lung disease characterized by peribronchovascular and subpleural ground-glass attenuation and reticulation with associated mild parenchymal distortion and mild traction bronchiectasis. Mild patchy air trapping. No frank honeycombing. No clear basilar gradient. This spectrum of findings is most consistent with chronic hypersensitivity pneumonitis. Progression of findings is not typical of fibrotic nonspecific interstitial pneumonia (NSIP). These findings are not consistent with usual interstitial pneumonia (UIP). 2. Aortic atherosclerosis.  One vessel coronary atherosclerosis. Electronically Signed   By: Ilona Sorrel M.D.   On: 10/03/2016 14:00     LOS: 1 day   Rose Clousing, PAS [employer]   If 7PM-7AM, please contact night-coverage [email or web address] Password [facility] 10/08/2016, 12:13 PM

## 2016-10-08 NOTE — Progress Notes (Signed)
ANTICOAGULATION CONSULT NOTE - Initial Consult  Pharmacy Consult for Xarelto Indication: pulmonary embolus  Allergies  Allergen Reactions  . Prednisone     pancreatitis  . Macrobid [Nitrofurantoin Monohyd Macro] Diarrhea    Diarrhea, GI upset  . Sulfa Antibiotics Rash    Patient Measurements: Height: 5\' 4"  (162.6 cm) Weight: 235 lb 14.3 oz (107 kg) IBW/kg (Calculated) : 54.7   Vital Signs: Temp: 97.5 F (36.4 C) (01/23 1138) Temp Source: Oral (01/23 1138) BP: 124/69 (01/23 1138) Pulse Rate: 81 (01/23 1138)  Labs:  Recent Labs  10/06/16 2207 10/07/16 0541  10/07/16 1002 10/07/16 1744 10/08/16 0032 10/08/16 0449 10/08/16 0941  HGB 12.9 11.7*  --   --   --  10.2*  --   --   HCT 38.7 36.1  --   --   --  30.5*  --   --   PLT 247 219  --   --   --  223  --   --   HEPARINUNFRC  --   --   < > 0.93* 0.25* 0.23* 0.57 0.29*  CREATININE 0.77 0.77  --   --   --   --   --   --   TROPONINI  --  <0.03  --  0.08* <0.03  --   --   --   < > = values in this interval not displayed.  Estimated Creatinine Clearance: 87 mL/min (by C-G formula based on SCr of 0.77 mg/dL).   Medical History: Past Medical History:  Diagnosis Date  . Allergy   . Anxiety   . Arthritis    KNEES,BACK,HIPS  . Depression   . Fibromyalgia   . GERD (gastroesophageal reflux disease)   . Hyperlipidemia   . Hypertension   . IBS (irritable bowel syndrome)   . Interstitial lung disease ([place])   . Sleep apnea    Assessment:  Anticoag: PE with right heart strain, Change IV heparin>>Xarelto.  Goal of Therapy:  Therapeutic oral anticoagulation Monitor platelets by anticoagulation protocol: Yes   Plan:  Xarelto 15mg  BID x 21d then 20mg  daily  Analysia Dungee S. Alford Highland, PharmD, BCPS Clinical Staff Pharmacist Pager [phone]  Eilene Ghazi Stillinger 10/08/2016,3:51 PM

## 2016-10-08 NOTE — Telephone Encounter (Signed)
Stacey Alvarez  Please give Markus Daft hospital fu with aPP in 1 week and then with me in 2 monhs (mid feb visit can be canceled by app if she is well in 1 week  [place]  Dr. Brand Males, M.D., [facility].C.P Pulmonary and Critical Care Medicine Staff Physician [facility] Pager: [phone], If no answer or between  15:00h - 7:00h: call [phone]  10/08/2016 11:16 PM

## 2016-10-08 NOTE — Progress Notes (Signed)
[place] for Heparin  Indication: pulmonary embolus  Allergies  Allergen Reactions  . Prednisone     pancreatitis  . Macrobid [Nitrofurantoin Monohyd Macro] Diarrhea    Diarrhea, GI upset  . Sulfa Antibiotics Rash   Patient Measurements: Height: 5\' 4"  (162.6 cm) Weight: 235 lb 14.3 oz (107 kg) IBW/kg (Calculated) : 54.7  Vital Signs: Temp: 97.8 F (36.6 C) (01/23 0755) Temp Source: Oral (01/23 0755) BP: 128/61 (01/23 0755) Pulse Rate: 81 (01/23 0755)  Labs:  Recent Labs  10/06/16 2207 10/07/16 0541  10/07/16 1002 10/07/16 1744 10/08/16 0032 10/08/16 0449 10/08/16 0941  HGB 12.9 11.7*  --   --   --  10.2*  --   --   HCT 38.7 36.1  --   --   --  30.5*  --   --   PLT 247 219  --   --   --  223  --   --   HEPARINUNFRC  --   --   < > 0.93* 0.25* 0.23* 0.57 0.29*  CREATININE 0.77 0.77  --   --   --   --   --   --   TROPONINI  --  <0.03  --  0.08* <0.03  --   --   --   < > = values in this interval not displayed.  Estimated Creatinine Clearance: 87 mL/min (by C-G formula based on SCr of 0.77 mg/dL).   Assessment: 61 y.o. female with PE for heparin Heparin level a little low now at 0.29  Goal of Therapy:  Heparin level 0.3-0.7 units/ml Monitor platelets by anticoagulation protocol: Yes   Plan:  Heparin to 1800 units / hr Follow up AM labs  Thank you Anette Guarneri, PharmD [phone].  10/08/2016 11:00 AM

## 2016-10-08 NOTE — Progress Notes (Signed)
[place] for Heparin  Indication: pulmonary embolus  Allergies  Allergen Reactions  . Prednisone     pancreatitis  . Macrobid [Nitrofurantoin Monohyd Macro] Diarrhea    Diarrhea, GI upset  . Sulfa Antibiotics Rash   Patient Measurements: Height: 5\' 4"  (162.6 cm) Weight: 235 lb 14.3 oz (107 kg) IBW/kg (Calculated) : 54.7  Vital Signs: Temp: 98.5 F (36.9 C) (01/22 2204) Temp Source: Oral (01/23 0043) BP: 122/59 (01/23 0043) Pulse Rate: 82 (01/23 0043)  Labs:  Recent Labs  10/06/16 2207 10/07/16 0541 10/07/16 1002 10/07/16 1744 10/08/16 0032  HGB 12.9 11.7*  --   --  10.2*  HCT 38.7 36.1  --   --  30.5*  PLT 247 219  --   --  223  HEPARINUNFRC  --   --  0.93* 0.25* 0.23*  CREATININE 0.77 0.77  --   --   --   TROPONINI  --  <0.03 0.08* <0.03  --     Estimated Creatinine Clearance: 87 mL/min (by C-G formula based on SCr of 0.77 mg/dL).   Assessment: 61 y.o. female with PE for heparin  Goal of Therapy:  Heparin level 0.3-0.7 units/ml Monitor platelets by anticoagulation protocol: Yes   Plan:  Heparin 2000 units IV bolus, then increase heparin  1650 units/hr Check heparin level in 6 hours.  Phillis Knack, PharmD, BCPS   10/08/2016 1:59 AM

## 2016-10-08 NOTE — Care Management Note (Addendum)
Case Management Note  Patient Details  Name: Stacey Alvarez MRN: [id number] Date of Birth: [birth year]  Subjective/Objective: Pt presented for Pulmonary Embolism. Pt will be on Xarelto once stable for d/c. Benefits check completed.  S/W ERIC @ [employer] # [phone]   XARELTO  20 MG DAILY   COVER- YES  CO-PAY- $ 226.11 DEDUCTIBLE NOT MET  TIER- 3 DRUG  PHARMACY :[facility]   *WHEN DEDUCTIBLE HAS BEEN MET THE COST WILL BE $ 35.00                    Action/Plan: CM will provide pt with 30 day free card. Pt to call Xarelto for co pay card for year supply free card. No further needs from CM @ this time.   Expected Discharge Date:                  Expected Discharge Plan:  Home/Self Care  In-House Referral:  NA  Discharge planning Services  CM Consult, Medication Assistance  Post Acute Care Choice:  NA Choice offered to:  NA  DME Arranged:  N/A DME Agency:  NA  HH Arranged:  NA HH Agency:  NA  Status of Service:  Completed, signed off  If discussed at [place] of Stay Meetings, dates discussed:    Additional Comments: [address] Jacqlyn Krauss, RN,BSN [phone] Pt has DME home 02 @ 2L via [place]. Pt's husband to bring 02 for pt to transport home. CM did ask pt to ask husband to being portable tank before d/c home. No further needs from CM at this   1556 10-08-16 Pt uses [address]-Xarelto starter kit is not available. CM also called [facility] and medication starter pack is not available. CM did call [facility] and medication via starter pack- medication is available. MD please write Rx as starter pack for 30 day free card to work. No further needs from CM at this time.  Bethena Roys, RN 10/08/2016, 3:34 PM

## 2016-10-08 NOTE — Progress Notes (Signed)
PULMONARY / CRITICAL CARE MEDICINE   Name: Stacey Alvarez MRN: [id number] DOB: June 20, 1956    ADMISSION DATE:  10/06/2016 CONSULTATION DATE:  10/07/2016  REFERRING MD:  Caryl Asp, PA-C (EDP)  CHIEF COMPLAINT:  Dyspnea  HISTORY OF PRESENT ILLNESS:   61 year old woman with history of interstitial lung disease of unknown etiology, chronic hypoxic respiratory failure on 2L O2 prn, OSA, GERD, HTN, HLD presenting with dyspnea. At baseline, she has occasional dyspnea. She had worsening of her dyspnea 1 week ago, and she went to see Dr. Chase Caller on 1/19. Saturations on room air while ambulating were 84% She was started on 2L supplemental oxygen with exertion and prescribed doxycycline. Her dyspnea had improved some but then worsened last night. She has right upper back pain with coughing. She has a right rotator cuff repair done Nov 1. She denies fevers. She has some calf myalgias but no swelling. No recent travel/long car rides. Mother was on warfarin but she is not sure why.  She was diagnosed with ILD of unknown etiology in September 2017. She was to see a thoracic surgeon this week for lung biopsy. She has been on Premarin for years which has helped with her mood and hot flashes.   In the ED, her HR was 86 to 90. BP 115/97-163/100. O2 sat 95-99%. She was started on a heparin gtt.   PAST MEDICAL HISTORY :  She  has a past medical history of Allergy; Anxiety; Arthritis; Depression; Fibromyalgia; GERD (gastroesophageal reflux disease); Hyperlipidemia; Hypertension; IBS (irritable bowel syndrome); Interstitial lung disease ([place]); and Sleep apnea.  PAST SURGICAL HISTORY: She  has a past surgical history that includes Partial hysterectomy (1999); Cholecystectomy; Colonoscopy; and Polypectomy.  Allergies  Allergen Reactions  . Prednisone     pancreatitis  . Macrobid [Nitrofurantoin Monohyd Macro] Diarrhea    Diarrhea, GI upset  . Sulfa Antibiotics Rash    No current facility-administered medications on  file prior to encounter.    Current Outpatient Prescriptions on File Prior to Encounter  Medication Sig  . ALPRAZolam (XANAX) 1 MG tablet 0.5 mg in the am and 2mg  at night  . amLODipine (NORVASC) 5 MG tablet Take 1 tablet by mouth at  bedtime  . butalbital-aspirin-caffeine-codeine (FIORINAL WITH CODEINE) 50-325-40-30 MG capsule TAKE 1 CAPSULE BY MOUTH EVERY 6 HOURS AS NEEDED FOR PAIN  . cetirizine (ZYRTEC) 10 MG tablet Take 10 mg by mouth daily.  . fenofibrate 160 MG tablet TAKE 1 TABLET BY MOUTH  DAILY  . FLUoxetine (PROZAC) 20 MG tablet Take 40 mg by mouth daily.  . fluticasone (FLONASE) 50 MCG/ACT nasal spray Use 2 sprays in each  nostril daily  . HYDROcodone-acetaminophen (NORCO) 10-325 MG tablet Take 1 tablet by mouth every 6 (six) hours as needed.  . lamoTRIgine (LAMICTAL) 100 MG tablet Take 100 mg by mouth daily.  Marland Kitchen lisinopril (PRINIVIL,ZESTRIL) 20 MG tablet TAKE 1 TABLET BY MOUTH TWO  TIMES DAILY  . pravastatin (PRAVACHOL) 20 MG tablet TAKE 1 TABLET(20 MG) BY MOUTH DAILY  . PREMARIN 0.625 MG tablet TAKE 1 TABLET BY MOUTH  (0.625MG ) DAILY  . promethazine (PHENERGAN) 25 MG tablet Take 1 tablet (25 mg total) by mouth every 8 (eight) hours as needed for nausea or vomiting.  . ranitidine (ZANTAC) 150 MG tablet Take 1 tablet (150 mg total) by mouth 2 (two) times daily.  . temazepam (RESTORIL) 30 MG capsule Take 30 mg by mouth at bedtime.  Marland Kitchen acetaminophen-codeine (TYLENOL #3) 300-30 MG tablet Take 1 tablet by mouth as  needed.   . doxycycline (VIBRA-TABS) 100 MG tablet Take 1 tablet, twice daily for 5 days. Avoid sunlight and take after meals. (Patient not taking: Reported on 10/06/2016)  . methocarbamol (ROBAXIN) 500 MG tablet Take 500 mg by mouth 2 (two) times daily.    FAMILY HISTORY:  Her indicated that her mother is deceased. She indicated that her father is deceased. She indicated that her sister is alive. She indicated that the status of her paternal grandmother is unknown. She  indicated that the status of her son is unknown.    SOCIAL HISTORY: She  reports that she has quit smoking. She has never used smokeless tobacco. She reports that she drinks alcohol. She reports that she does not use drugs.   SUBJECTIVE:  Feeling much better today.  VITAL SIGNS: BP 128/61   Pulse 81   Temp 97.8 F (36.6 C) (Oral)   Resp 15   Ht 5\' 4"  (1.626 m)   Wt 107 kg (235 lb 14.3 oz)   SpO2 95%   BMI 40.49 kg/m   INTAKE / OUTPUT: I/O last 3 completed shifts: In: 4677.6 [P.O.:480; I.V.:2147.6; IV [id number] Out: 1350 [Urine:1350]  PHYSICAL EXAMINATION: General:  Overweight female in NAD Neuro: Awake, alert, oriented, non-focal HEENT:  [place]/AT, No JVD noted, PERRL Cardiovascular:  RRR, no MRG Lungs:  Clear bilateral breath sounds. Respirations even and unlabored on room air. O2 sats high 90s.  Abdomen:  Soft, non-distended, non-tender Musculoskeletal:  No acute deformity Skin:  Intact, MMM    LABS:  BMET  Recent Labs Lab 10/06/16 2207 10/07/16 0541  NA 138 141  K 3.8 4.0  CL 102 109  CO2 24 20*  BUN 10 10  CREATININE 0.77 0.77  GLUCOSE 129* 201*    Electrolytes  Recent Labs Lab 10/06/16 2207 10/07/16 0541  CALCIUM 9.7 8.3*    CBC  Recent Labs Lab 10/06/16 2207 10/07/16 0541 10/08/16 0032  WBC 7.7 6.9 9.3  HGB 12.9 11.7* 10.2*  HCT 38.7 36.1 30.5*  PLT 247 219 223    Coag's No results for input(s): APTT, INR in the last 168 hours.  Sepsis Markers  Recent Labs Lab 10/07/16 0042 10/07/16 0541  LATICACIDVEN 1.66 2.6*    ABG  Recent Labs Lab 10/07/16 0058  PHART 7.416  PCO2ART 38.9  PO2ART 94.0    Liver Enzymes  Recent Labs Lab 10/06/16 2207 10/07/16 0541  AST 14* 16  ALT 9* 9*  ALKPHOS 62 53  BILITOT 0.3 0.4  ALBUMIN 3.6 3.3*    Imaging No results found.   STUDIES:  CXR 1/22 >> Bibasilar densities CTA chest 1/22 >> Acute PE, RV/LV ratio 2  CULTURES: Blood cx 1/22>> Respiratory panel  1/22>> Urine cx 1/22>>  ANTIBIOTICS: None  SIGNIFICANT EVENTS: 1/22 Admitted  LINES/TUBES: PIV  DISCUSSION: 61 year old woman with history of interstitial lung disease of unknown etiology, chronic hypoxic respiratory failure on 2L O2 prn, OSA, GERD, HTN, HLD presenting with dyspnea found to have acute PE on CTA chest and CT evidence of right heart strain. Dopplers positive for bilateral posterior tibial vein DVT as well as L peroneal vein DVT. Echo with normal RV, normal PA peak pressures, and LVEF 65-70%.   ASSESSMENT / PLAN:  Acute Intermediate Risk Pulmonary Embolism: Likely provoked with recent shoulder surgery and premarin use for approximately 20 years.  - Plan to DC heparin in favor of DOAC today - Recommend 6 months of anticoagulation - DC premarin - Supplemental O2 as needed to keep  SpO2 > 90% (was on 2L at home, but doing OK on RA here)  Interstitial Lung Disease with chronic hypoxemic respiratory failure. Followed by Dr Chase Caller, was initially for Lung Bx this week - Will need to defer additional workup at this time - Follow up with Dr. Chase Caller as outpatient.  Hopeful for discharge next 24-48 hours PCCM will sign off  Georgann Housekeeper, [facility]Critical Care Pager [phone] or [phone]  10/08/2016 10:48 AM   STAFF NOTE: Linwood Dibbles, MD FACP have personally reviewed patient's available data, including medical history, events of note, physical examination and test results as part of my evaluation. I have discussed with resident/NP and other care providers such as pharmacist, RN and RRT. In addition, I personally evaluated patient and elicited key findings of: awake, no distress, CTA, no rt heeve, no tachy, BP wnl, ct with distal clot and low clot burden, initial CT with ratio 2.0 of RV/LV but ECHO (more accurate) shows normal RV fxn, NO indication ekos, continued heparin to oral therapy, no role filter, likely causes ar surgery and  premarin, therefore should follow with DR Chase Caller for decision on duration, likely 6 months, avoid sats less 90% as could increase Pa pressures, call if needed in future   Lavon Paganini. Titus Mould, MD, [place] Pgr: [facility] 10/08/2016 2:46 PM

## 2016-10-08 NOTE — Discharge Instructions (Addendum)
Information on my medicine - XARELTO (rivaroxaban)  This medication education was reviewed with me or my healthcare representative as part of my discharge preparation.  The pharmacist that spoke with me during my hospital stay was:  Wayland Salinas, [place]? Xarelto was prescribed to treat blood clots that may have been found in the veins of your legs (deep vein thrombosis) or in your lungs (pulmonary embolism) and to reduce the risk of them occurring again.  What do you need to know about Xarelto? The starting dose is one 15 mg tablet taken TWICE daily with food for the FIRST 21 DAYS then on (enter date)  10/19/2016  the dose is changed to one 20 mg tablet taken ONCE A DAY with your evening meal.  DO NOT stop taking Xarelto without talking to the health care provider who prescribed the medication.  Refill your prescription for 20 mg tablets before you run out.  After discharge, you should have regular check-up appointments with your healthcare provider that is prescribing your Xarelto.  In the future your dose may need to be changed if your kidney function changes by a significant amount.  What do you do if you miss a dose? If you are taking Xarelto TWICE DAILY and you miss a dose, take it as soon as you remember. You may take two 15 mg tablets (total 30 mg) at the same time then resume your regularly scheduled 15 mg twice daily the next day.  If you are taking Xarelto ONCE DAILY and you miss a dose, take it as soon as you remember on the same day then continue your regularly scheduled once daily regimen the next day. Do not take two doses of Xarelto at the same time.   Important Safety Information Xarelto is a blood thinner medicine that can cause bleeding. You should call your healthcare provider right away if you experience any of the following: ? Bleeding from an injury or your nose that does not stop. ? Unusual colored urine (red or  dark brown) or unusual colored stools (red or black). ? Unusual bruising for unknown reasons. ? A serious fall or if you hit your head (even if there is no bleeding).  Some medicines may interact with Xarelto and might increase your risk of bleeding while on Xarelto. To help avoid this, consult your healthcare provider or pharmacist prior to using any new prescription or non-prescription medications, including herbals, vitamins, non-steroidal anti-inflammatory drugs (NSAIDs) and supplements.  This website has more information on Xarelto: [email or web address]-benson.[email or web address]    Pulmonary Embolism A pulmonary embolism (PE) is a sudden blockage or decrease of blood flow in one lung or both lungs. Most blockages come from a blood clot that travels from the legs or the pelvis to the lungs. PE is a dangerous and potentially life-threatening condition if it is not treated right away. What are the causes? A pulmonary embolism occurs most commonly when a blood clot travels from one of your veins to your lungs. Rarely, PE is caused by air, fat, amniotic fluid, or part of a tumor traveling through your veins to your lungs. What increases the risk? A PE is more likely to develop in:  People who smoke.  People who areolder, especially over 41 years of age.  People who are overweight (obese).  People who sit or lie still for a long time, such as during long-distance travel (over 4 hours), bed rest, hospitalization, or during recovery from certain medical conditions  like a stroke.  People who do not engage in much physical activity (sedentary lifestyle).  People who have chronic breathing disorders.  People whohave a personal or family history of blood clots or blood clotting disease.  People whohave peripheral vascular disease (PVD), diabetes, or some types of cancer.  People who haveheart disease, especially if the person had a recent heart attack or has congestive heart failure.  People who have  neurological diseases that affect the legs (leg paresis).  People who have had a traumatic injury, such as breaking a hip or leg.  People whohave recently had major or lengthy surgery, especially on the hip, knee, or abdomen.  People who have hada central line placed inside a large vein.  People who takemedicines that contain the hormone estrogen. These include birth control pills and hormone replacement therapy.  Pregnancy or during childbirth or the postpartum period. What are the signs or symptoms? The symptoms of a PE usually start suddenly and include:  Shortness of breath while active or at rest.  Coughing or coughing up blood or blood-tinged mucus.  Chest pain that is often worse with deep breaths.  Rapid or irregular heartbeat.  Feeling light-headed or dizzy.  Fainting.  Feelinganxious.  Sweating. There may also be pain and swelling in a leg if that is where the blood clot started. These symptoms may represent a serious problem that is an emergency. Do not wait to see if the symptoms will go away. Get medical help right away. Call your local emergency services (911 in the [place] Do not drive yourself to the hospital.  How is this diagnosed? Your health care provider will take a medical history and perform a physical exam. You may also have other tests, including:  Blood tests to assess the clotting properties of your blood, assess oxygen levels in your blood, and find blood clots.  Imaging tests, such as CT, ultrasound, MRI, X-ray, and other tests to see if you have clots anywhere in your body.  An electrocardiogram (ECG) to look for heart strain from blood clots in the lungs. How is this treated? The main goals of PE treatment are:  To stop a blood clot from growing larger.  To stop new blood clots from forming. The type of treatment that you receive depends on many factors, such as the cause of your PE, your risk for bleeding or developing more clots, and  other medical conditions that you have. Sometimes, a combination of treatments is necessary. This condition may be treated with:  Medicines, including newer oral blood thinners (anticoagulants), warfarin, low molecular weight heparins, thrombolytics, or heparins.  Wearing compression stockings or using different types of devices.  Surgery (rare) to remove the blood clot or to place a filter in your abdomen to stop the blood clot from traveling to your lungs. Treatments for a PE are often divided into immediate treatment, long-term treatment (up to 3 months after PE), and extended treatment (more than 3 months after PE). Your treatment may continue for several months. This is called maintenance therapy, and it is used to prevent the forming of new blood clots. You can work with your health care provider to choose the treatment program that is best for you. What are anticoagulants?  Anticoagulants are medicines that treat PEs. They can stop current blood clots from growing and stop new clots from forming. They cannot dissolve existing clots. Your body dissolves clots by itself over time. Anticoagulants are given by mouth, by injection, or through an  IV tube. What are thrombolytics?  Thrombolytics are clot-dissolving medicines that are used to dissolve a PE. They carry a high risk of bleeding, so they tend to be used only in severe cases or if you have very low blood pressure. Follow these instructions at home: If you are taking a newer oral anticoagulant:  Take the medicine every single day at the same time each day.  Understand what foods and drugs interact with this medicine.  Understand that there are no regular blood tests required when using this medicine.  Understandthe side effects of this medicine, including excessive bruising or bleeding. Ask your health care provider or pharmacist about other possible side effects. If you are taking warfarin:  Understand how to take warfarin and  know which foods can affect how warfarin works in Veterinary surgeon.  Understand that it is dangerous to taketoo much or too little warfarin. Too much warfarin increases the risk of bleeding. Too little warfarin continues to allow the risk for blood clots.  Follow your PT and INR blood testing schedule. The PT and INR results allow your health care provider to adjust your dose of warfarin. It is very important that you have your PT and INR tested as often as told by your health care provider.  Avoid major changes in your diet, or tell your health care provider before you change your diet. Arrange a visit with a registered dietitian to answer your questions. Many foods, especially foods that are high in vitamin K, can interfere with warfarin and affect the PT and INR results. Eat a consistent amount of foods that are high in vitamin K, such as:  Spinach, kale, broccoli, cabbage, collard greens, turnip greens, Brussels sprouts, peas, cauliflower, seaweed, and parsley.  Beef liver and pork liver.  Green tea.  Soybean oil.  Tell your health care provider about any and all medicines, vitamins, and supplements that you take, including aspirin and other over-the-counter anti-inflammatory medicines. Be especially cautious with aspirin and anti-inflammatory medicines. Do not take those before you ask your health care provider if it is safe to do so. This is important because many medicines can interfere with warfarin and affect the PT and INR results.  Do not start or stop taking any over-the-counter or prescription medicine unless your health care provider or pharmacist tells you to do so. If you take warfarin, you will also need to do these things:  Hold pressure over cuts for longer than usual.  Tell your dentist and other health care providers that you are taking warfarin before you have any procedures in which bleeding may occur.  Avoid alcohol or drink very small amounts. Tell your health care  provider if you change your alcohol intake.  Do not use tobacco products, including cigarettes, chewing tobacco, and e-cigarettes. If you need help quitting, ask your health care provider.  Avoid contact sports. General instructions  Take over-the-counter and prescription medicines only as told by your health care provider. Anticoagulant medicines can have side effects, including easy bruising and difficulty stopping bleeding. If you are prescribed an anticoagulant, you will also need to do these things:  Hold pressure over cuts for longer than usual.  Tell your dentist and other health care providers that you are taking anticoagulants before you have any procedures in which bleeding may occur.  Avoid contact sports.  Wear a medical alert bracelet or carry a medical alert card that says you have had a PE.  Ask your health care provider how soon you  can go back to your normal activities. Stay active to prevent new blood clots from forming.  Make sure to exercise while traveling or when you have been sitting or standing for a long period of time. It is very important to exercise. Exercise your legs by walking or by tightening and relaxing your leg muscles often. Take frequent walks.  Wear compression stockings as told by your health care provider to help prevent more blood clots from forming.  Do not use tobacco products, including cigarettes, chewing tobacco, and e-cigarettes. If you need help quitting, ask your health care provider.  Keep all follow-up appointments with your health care provider. This is important. How is this prevented? Take these actions to decrease your risk of developing another PE:  Exercise regularly. For at least 30 minutes every day, engage in:  Activity that involves moving your arms and legs.  Activity that encourages good blood flow through your body by increasing your heart rate.  Exercise your arms and legs every hour during long-distance travel (over  4 hours). Drink plenty of water and avoid drinking alcohol while traveling.  Avoid sitting or lying in bed for long periods of time without moving your legs.  Maintain a weight that is appropriate for your height. Ask your health care provider what weight is healthy for you.  If you are a woman who is over 67 years of age, avoid unnecessary use of medicines that contain estrogen. These include birth control pills.  Do not smoke, especially if you take estrogen medicines. If you need help quitting, ask your health care provider.  If you are at very high risk for PE, wear compression stockings.  If you recently had a PE, have regularly scheduled ultrasound testing on your legs to check for new blood clots. If you are hospitalized, prevention measures may include:  Early walking after surgery, as soon as your health care provider says that it is safe.  Receiving anticoagulants to prevent blood clots. If you cannot take anticoagulants, other options may be available, such as wearing compression stockings or using different types of devices. Get help right away if:  You have new or increased pain, swelling, or redness in an arm or leg.  You have numbness or tingling in an arm or leg.  You have shortness of breath while active or at rest.  You have chest pain.  You have a rapid or irregular heartbeat.  You feel light-headed or dizzy.  You cough up blood.  You notice blood in your vomit, bowel movement, or urine.  You have a fever. These symptoms may represent a serious problem that is an emergency. Do not wait to see if the symptoms will go away. Get medical help right away. Call your local emergency services (911 in the [place] Do not drive yourself to the hospital.  This information is not intended to replace advice given to you by your health care provider. Make sure you discuss any questions you have with your health care provider. Document Released: 08/30/2000 Document Revised:  02/08/2016 Document Reviewed: 12/28/2014 Elsevier Interactive Patient Education  2017 [employer].

## 2016-10-09 DIAGNOSIS — I82403 Acute embolism and thrombosis of unspecified deep veins of lower extremity, bilateral: Secondary | ICD-10-CM | POA: Diagnosis present

## 2016-10-09 DIAGNOSIS — G4733 Obstructive sleep apnea (adult) (pediatric): Secondary | ICD-10-CM

## 2016-10-09 DIAGNOSIS — J849 Interstitial pulmonary disease, unspecified: Secondary | ICD-10-CM

## 2016-10-09 MED ORDER — RIVAROXABAN 15 MG PO TABS
15.0000 mg | ORAL_TABLET | Freq: Two times a day (BID) | ORAL | Status: DC
  Administered 2016-10-09: 15 mg via ORAL
  Filled 2016-10-09: qty 1

## 2016-10-09 MED ORDER — RIVAROXABAN (XARELTO) VTE STARTER PACK (15 & 20 MG): ORAL_TABLET | ORAL | 2 refills | Status: DC

## 2016-10-09 NOTE — Telephone Encounter (Signed)
Called and spoke to pt's husband. Appt made with TP for HFU on 1/31 and appt with MR made for March 2018. Pt's husband verbalized understanding and denied any further questions or concerns at this time.

## 2016-10-09 NOTE — Discharge Summary (Signed)
Physician Discharge Summary  Stacey Alvarez [id number] DOB: [birth year] DOA: 10/06/2016  PCP: Annye Asa, MD  Admit date: 10/06/2016 Discharge date: 10/09/2016  Admitted From: Home Disposition:  Home   Recommendations for Outpatient Follow-up:  1. Follow up with PCP in 1-2 weeks 2. Patient is being discharged on Xarelto for anticoagulation for bilateral PE and DVT. Recommended duration of treatment is for at least 6 months. ( stop date: 04/06/2017) 3. Patient will follow-up with her pulmonologist as outpatient and will reschedule planned outpatient lung biopsy and HRCT.  Home Health: None Equipment/Devices: Oxygen (2 L via nasal cannula on ambulation)  Discharge Condition: Fair CODE STATUS: Full code Diet recommendation: Regular    Discharge Diagnoses:  Principal Problem:   Bilateral pulmonary embolism (HCC)   Active Problems:   HTN (hypertension)   OSA (obstructive sleep apnea)   ILD (interstitial lung disease) ([place])   DVT of lower extremity, bilateral ([place])  Brief narrative/history of present illness Please refer to admission H&P for details, in brief, 61 year old female with newly diagnosed ILD (follows with Dr. Chase Caller), hypertension, OSA on CPAP and hyperlipidemia presented to the ED with shortness of breath progressed over the past 1 week. Patient reports having surgery for rotator cuff about 2 months back and has been sedentary since then. Was found to be in acute hypoxic respiratory failure. CT angiogram of the chest showed bilateral pulmonary embolism without right heart strain. (RLL, RUL and LUL) Admitted to hospitalist service.   Hospital course Principal problem  Acute hypoxic respiratory failure secondary to bilateral pulmonary embolism Likely secondary to sedentary lifestyle following recent rotator cuff surgery, ILD and  Premarin use. Doppler of lower extremities shows acute DVT involving right and left posterior tibial veins and right and left peroneal  veins. 2-D echo negative for right heart strain. Placed on IV heparin. Hemodynamically stable and switch to oral Xarelto (after discussing with patient on anticoagulation options, risk and benefits of each). Patient will need to be treated for at least 6 months.  Bilateral lower extremity DVTs Findings as above. Anticoagulation for 6 months.  OSA Continue nighttime CPAP  Recently diagnosed ILD with respiratory failure Patient was provided home O2 recently by her pulmonologist for as needed use. Ambulated prior to discharge and desaturates to 80s on room air (needs 2 L via nasal cannula on ambulation). She was supposed to get lung biopsy in near future which will be rescheduled by her pulmonologist. They have rescheduled her appointment and plan for outpatient HRCT.  Hypertension and hyperlipidemia Stable. Continue home medications    patient is clinically stable to be discharged home with outpatient follow-up.  CODE STATUS: Full code   Procedure: CT angiogram the chest Doppler lower extremities 2-D echo   consult: PC CM   Discharge Instructions   Allergies as of 10/09/2016      Reactions   Prednisone    pancreatitis   Macrobid [nitrofurantoin Monohyd Macro] Diarrhea   Diarrhea, GI upset   Sulfa Antibiotics Rash      Medication List    STOP taking these medications   butalbital-aspirin-caffeine-codeine 50-325-40-30 MG capsule Commonly known as:  FIORINAL WITH CODEINE   doxycycline 100 MG tablet Commonly known as:  VIBRA-TABS   PREMARIN 0.625 MG tablet Generic drug:  estrogens (conjugated)     TAKE these medications   acetaminophen-codeine 300-30 MG tablet Commonly known as:  TYLENOL #3 Take 1 tablet by mouth as needed.   ALPRAZolam 1 MG tablet Commonly known as:  XANAX 0.5 mg in the  am and 2mg  at night   amLODipine 5 MG tablet Commonly known as:  NORVASC Take 1 tablet by mouth at  bedtime   cetirizine 10 MG tablet Commonly known as:  ZYRTEC Take  10 mg by mouth daily.   fenofibrate 160 MG tablet TAKE 1 TABLET BY MOUTH  DAILY   FLUoxetine 20 MG tablet Commonly known as:  PROZAC Take 40 mg by mouth daily.   fluticasone 50 MCG/ACT nasal spray Commonly known as:  FLONASE Use 2 sprays in each  nostril daily   HYDROcodone-acetaminophen 10-325 MG tablet Commonly known as:  NORCO Take 1 tablet by mouth every 6 (six) hours as needed.   lamoTRIgine 100 MG tablet Commonly known as:  LAMICTAL Take 100 mg by mouth daily.   lisinopril 20 MG tablet Commonly known as:  PRINIVIL,ZESTRIL TAKE 1 TABLET BY MOUTH TWO  TIMES DAILY   methocarbamol 500 MG tablet Commonly known as:  ROBAXIN Take 500 mg by mouth 2 (two) times daily.   OXYGEN Inhale 2 L into the lungs as needed (SOB).   pravastatin 20 MG tablet Commonly known as:  PRAVACHOL TAKE 1 TABLET(20 MG) BY MOUTH DAILY   promethazine 25 MG tablet Commonly known as:  PHENERGAN Take 1 tablet (25 mg total) by mouth every 8 (eight) hours as needed for nausea or vomiting.   ranitidine 150 MG tablet Commonly known as:  ZANTAC Take 1 tablet (150 mg total) by mouth 2 (two) times daily.   Rivaroxaban 15 & 20 MG Tbpk Take as directed on package: Start with one 15mg  tablet by mouth twice a day with food. On Day 22, switch to one 20mg  tablet once a day with food.   temazepam 30 MG capsule Commonly known as:  RESTORIL Take 30 mg by mouth at bedtime.      Follow-up Information    Annye Asa, MD. Schedule an appointment as soon as possible for a visit in 1 week(s).   Specialty:  Family Medicine Contact information: [id number] [place] [address] [phone]        RAMASWAMY,MURALI, MD Follow up in 1 week(s).   Specialty:  Pulmonary Disease Why:  office will arrange appt to see his APP next week Contact information: [place] [address] [phone]          Allergies  Allergen Reactions  . Prednisone     pancreatitis  . Macrobid  [Nitrofurantoin Monohyd Macro] Diarrhea    Diarrhea, GI upset  . Sulfa Antibiotics Rash      Procedures/Studies: Dg Chest 2 View  Result Date: 10/06/2016 CLINICAL DATA:  61 year old female with shortness of breath. EXAM: CHEST  2 VIEW COMPARISON:  Chest CT dated 10/03/2016 FINDINGS: There are bibasilar streaky densities, left greater right corresponding to the ground-glass and reticular densities noted on the CT and possibly related to hypersensitivity pneumonitis. Developing infiltrate is not entirely excluded. There is no focal consolidation, pleural effusion, or pneumothorax. The cardiac silhouette is within normal limits. No acute osseous pathology identified. Right upper quadrant cholecystectomy clips noted. IMPRESSION: Bibasilar streaky densities, left greater right may represent hypersensitivity pneumonitis. Developing infiltrate not excluded. Clinical correlation is recommended. Electronically Signed   By: Anner Crete M.D.   On: 10/06/2016 23:40   Ct Angio Chest Pe W Or Wo Contrast  Result Date: 10/07/2016 CLINICAL DATA:  61 year old female with shortness of breath and positive D-dimer. EXAM: CT ANGIOGRAPHY CHEST WITH CONTRAST TECHNIQUE: Multidetector CT imaging of the chest  was performed using the standard protocol during bolus administration of intravenous contrast. Multiplanar CT image reconstructions and MIPs were obtained to evaluate the vascular anatomy. CONTRAST:  100 cc Isovue 370 COMPARISON:  Chest radiograph dated 10/06/2016 and CT dated 10/03/2016 FINDINGS: Cardiovascular: There is mild cardiomegaly. There is enlargement of the right cardiac chamber compatible with right cardiac straining. The R weeks/LV diameter ratio is 4.7 cm/2.2 cm. There is no pericardial effusion. The thoracic aorta is unremarkable. The origins of the great vessels of the aortic arch appear patent. There is pulmonary artery embolus involving the lobar and segmental branches of the right lower lobe as well  as right upper lobe. Left upper lobe segmental pulmonary artery embolus is also noted. Evaluation is limited due to suboptimal opacification of the peripheral branches and streak artifact caused by patient's arms. Mediastinum/Nodes: There is no hilar or mediastinal adenopathy. Esophagus and the thyroid gland are grossly unremarkable. Lungs/Pleura: Bilateral streaky interstitial densities as seen on the prior study primarily involving the lung bases may be combination of atelectasis/ scarring and underlying interstitial lung disease and hypersensitivity pneumonitis. Clinical correlation is recommended. There is no focal consolidation, pleural effusion, or pneumothorax. The central airways are patent. Upper Abdomen: No acute abnormality. Musculoskeletal: No chest wall abnormality. No acute or significant osseous findings. Review of the MIP images confirms the above findings. IMPRESSION: Positive for acute PE with CT evidence of right heart strain (RV/LV Ratio = 2) consistent with at least submassive (intermediate risk) PE. The presence of right heart strain has been associated with an increased risk of morbidity and mortality. Please activate Code PE by paging [phone]. These results were called by telephone at the time of interpretation on 10/07/2016 at 2:50 am to Dr. Arlean Hopping , who verbally acknowledged these results. Electronically Signed   By: Anner Crete M.D.   On: 10/07/2016 02:59   Ct Chest High Resolution  Result Date: 10/03/2016 CLINICAL DATA:  Worsening dyspnea for 2 weeks. Follow-up prior high-resolution chest CT findings suggestive of interstitial lung disease. EXAM: CT CHEST WITHOUT CONTRAST TECHNIQUE: Multidetector CT imaging of the chest was performed following the standard protocol without intravenous contrast. High resolution imaging of the lungs, as well as inspiratory and expiratory imaging, was performed. COMPARISON:  04/10/2016 high-resolution chest CT. FINDINGS: Cardiovascular:  Top-normal heart size. No significant pericardial fluid/thickening. Left anterior descending coronary atherosclerosis. Mildly atherosclerotic nonaneurysmal thoracic aorta. Normal caliber pulmonary arteries (main pulmonary artery diameter 2.9 cm). Mediastinum/Nodes: Hypodense 0.4 cm posterior right thyroid lobe nodule. Unremarkable esophagus. No pathologically enlarged axillary, mediastinal or gross hilar lymph nodes, noting limited sensitivity for the detection of hilar adenopathy on this noncontrast study. Lungs/Pleura: No pneumothorax. No pleural effusion. Subpleural posteromedial left lower lobe 4 mm solid pulmonary nodule (series 8/ image 55), stable since 04/10/2016, considered benign. No acute consolidative airspace disease, lung masses or new significant pulmonary nodules. There is patchy ground-glass attenuation and reticulation involving the peribronchovascular and subpleural lungs bilaterally, slightly asymmetrically more prominent in the left lung. There is associated mild parenchymal distortion and mild traction bronchiectasis throughout both lungs. Areas of involvement of the posterior lungs persist on the prone sequences. There is no clear basilar gradient. There is no frank honeycombing. These findings have progressed since 04/10/2016. There is mild patchy air trapping throughout both lungs on the expiration sequence. Upper abdomen: Cholecystectomy. Musculoskeletal: No aggressive appearing focal osseous lesions. Moderate thoracic spondylosis. IMPRESSION: 1. Interval progression of fibrotic interstitial lung disease characterized by peribronchovascular and subpleural ground-glass attenuation and reticulation with  associated mild parenchymal distortion and mild traction bronchiectasis. Mild patchy air trapping. No frank honeycombing. No clear basilar gradient. This spectrum of findings is most consistent with chronic hypersensitivity pneumonitis. Progression of findings is not typical of fibrotic  nonspecific interstitial pneumonia (NSIP). These findings are not consistent with usual interstitial pneumonia (UIP). 2. Aortic atherosclerosis.  One vessel coronary atherosclerosis. Electronically Signed   By: Ilona Sorrel M.D.   On: 10/03/2016 14:00    2d echo Study Conclusions  - Left ventricle: The cavity size was normal. There was mild   concentric hypertrophy. Systolic function was vigorous. The   estimated ejection fraction was in the range of 65% to 70%. Wall   motion was normal; there were no regional wall motion   abnormalities. Doppler parameters are consistent with abnormal   left ventricular relaxation (grade 1 diastolic dysfunction).   Doppler parameters are consistent with elevated ventricular   end-diastolic filling pressure. - Aortic valve: There was mild regurgitation. - Left atrium: The atrium was normal in size. - Right ventricle: The cavity size was normal. Wall thickness was   normal. Systolic function was normal. - Tricuspid valve: There was mild regurgitation. - Pulmonary arteries: Systolic pressure was within the normal   range. - Inferior vena cava: The vessel was normal in size. - Pericardium, extracardiac: There was no pericardial effusion.  Impressions:  - Normal right ventricular size and systolic function. No pulmonary   hypertension.   Subjective: Feels better. No overnight issues.   Discharge Exam: Vitals:   10/08/16 2155 10/09/16 0520  BP:  (!) 146/68  Pulse:  77  Resp:  18  Temp: 97.9 F (36.6 C) 98.3 F (36.8 C)   Vitals:   10/08/16 1138 10/08/16 1632 10/08/16 2155 10/09/16 0520  BP: 124/69 128/61  (!) 146/68  Pulse: 81 82  77  Resp: 18 19  18   Temp: 97.5 F (36.4 C) 97.9 F (36.6 C) 97.9 F (36.6 C) 98.3 F (36.8 C)  TempSrc: Oral Oral  Oral  SpO2: 94% 97%  96%  Weight:    105.9 kg (233 lb 6.4 oz)  Height:        General: Middle aged obese female not in distress HEENT: Moist mucosa, supple neck Chest: Clear  bilaterally CVS: Normal S1 and S2, no murmurs GI: Soft, nondistended, nontender Musculoskeletal: Warm, no edema or calf swelling    The results of significant diagnostics from this hospitalization (including imaging, microbiology, ancillary and laboratory) are listed below for reference.     Microbiology: Recent Results (from the past 240 hour(s))  Culture, blood (routine x 2)     Status: None (Preliminary result)   Collection Time: 10/07/16 12:28 AM  Result Value Ref Range Status   Specimen Description BLOOD LEFT ANTECUBITAL  Final   Special Requests BOTTLES DRAWN AEROBIC AND ANAEROBIC 5CC EA  Final   Culture NO GROWTH 1 DAY  Final   Report Status PENDING  Incomplete  Respiratory Panel by PCR     Status: None   Collection Time: 10/07/16 12:31 AM  Result Value Ref Range Status   Adenovirus NOT DETECTED NOT DETECTED Final   Coronavirus 229E NOT DETECTED NOT DETECTED Final   Coronavirus HKU1 NOT DETECTED NOT DETECTED Final   Coronavirus NL63 NOT DETECTED NOT DETECTED Final   Coronavirus OC43 NOT DETECTED NOT DETECTED Final   Metapneumovirus NOT DETECTED NOT DETECTED Final   Rhinovirus / Enterovirus NOT DETECTED NOT DETECTED Final   Influenza A NOT DETECTED NOT DETECTED Final  Influenza B NOT DETECTED NOT DETECTED Final   Parainfluenza Virus 1 NOT DETECTED NOT DETECTED Final   Parainfluenza Virus 2 NOT DETECTED NOT DETECTED Final   Parainfluenza Virus 3 NOT DETECTED NOT DETECTED Final   Parainfluenza Virus 4 NOT DETECTED NOT DETECTED Final   Respiratory Syncytial Virus NOT DETECTED NOT DETECTED Final   Bordetella pertussis NOT DETECTED NOT DETECTED Final   Chlamydophila pneumoniae NOT DETECTED NOT DETECTED Final   Mycoplasma pneumoniae NOT DETECTED NOT DETECTED Final  Culture, blood (routine x 2)     Status: None (Preliminary result)   Collection Time: 10/07/16 12:32 AM  Result Value Ref Range Status   Specimen Description BLOOD RIGHT ARM  Final   Special Requests IN  PEDIATRIC BOTTLE 3CC  Final   Culture NO GROWTH 1 DAY  Final   Report Status PENDING  Incomplete  Urine culture     Status: Abnormal   Collection Time: 10/07/16  2:05 AM  Result Value Ref Range Status   Specimen Description URINE, RANDOM  Final   Special Requests NONE  Final   Culture <10,000 COLONIES/mL INSIGNIFICANT GROWTH (A)  Final   Report Status 10/08/2016 FINAL  Final  MRSA PCR Screening     Status: None   Collection Time: 10/07/16 11:08 AM  Result Value Ref Range Status   MRSA by PCR NEGATIVE NEGATIVE Final    Comment:        The GeneXpert MRSA Assay (FDA approved for NASAL specimens only), is one component of a comprehensive MRSA colonization surveillance program. It is not intended to diagnose MRSA infection nor to guide or monitor treatment for MRSA infections.      Labs: BNP (last 3 results)  Recent Labs  10/06/16 2156 10/07/16 0542  BNP 20.4 [id number]   Basic Metabolic Panel:  Recent Labs Lab 10/06/16 2207 10/07/16 0541  NA 138 141  K 3.8 4.0  CL 102 109  CO2 24 20*  GLUCOSE 129* 201*  BUN 10 10  CREATININE 0.77 0.77  CALCIUM 9.7 8.3*   Liver Function Tests:  Recent Labs Lab 10/06/16 2207 10/07/16 0541  AST 14* 16  ALT 9* 9*  ALKPHOS 62 53  BILITOT 0.3 0.4  PROT 6.6 6.2*  ALBUMIN 3.6 3.3*   No results for input(s): LIPASE, AMYLASE in the last 168 hours. No results for input(s): AMMONIA in the last 168 hours. CBC:  Recent Labs Lab 10/06/16 2207 10/07/16 0541 10/08/16 0032  WBC 7.7 6.9 9.3  NEUTROABS 4.7 5.8  --   HGB 12.9 11.7* 10.2*  HCT 38.7 36.1 30.5*  MCV 80.6 81.1 80.7  PLT 247 219 223   Cardiac Enzymes:  Recent Labs Lab 10/07/16 0541 10/07/16 1002 10/07/16 1744  TROPONINI <0.03 0.08* <0.03   BNP: Invalid input(s): POCBNP CBG: No results for input(s): GLUCAP in the last 168 hours. D-Dimer  Recent Labs  10/06/16 2235  DDIMER 1.42*   Hgb A1c No results for input(s): HGBA1C in the last 72 hours. Lipid  Profile No results for input(s): CHOL, HDL, LDLCALC, TRIG, CHOLHDL, LDLDIRECT in the last 72 hours. Thyroid function studies No results for input(s): TSH, T4TOTAL, T3FREE, THYROIDAB in the last 72 hours.  Invalid input(s): FREET3 Anemia work up No results for input(s): VITAMINB12, FOLATE, FERRITIN, TIBC, IRON, RETICCTPCT in the last 72 hours. Urinalysis    Component Value Date/Time   COLORURINE YELLOW 10/07/2016 0205   APPEARANCEUR HAZY (A) 10/07/2016 0205   LABSPEC 1.027 10/07/2016 0205   PHURINE 5.0 10/07/2016 0205  GLUCOSEU NEGATIVE 10/07/2016 0205   HGBUR NEGATIVE 10/07/2016 0205   BILIRUBINUR NEGATIVE 10/07/2016 0205   BILIRUBINUR neg 05/23/2016 1424   KETONESUR NEGATIVE 10/07/2016 0205   PROTEINUR NEGATIVE 10/07/2016 0205   UROBILINOGEN 0.2 05/23/2016 1424   UROBILINOGEN 0.2 04/14/2014 0410   NITRITE NEGATIVE 10/07/2016 0205   LEUKOCYTESUR NEGATIVE 10/07/2016 0205   Sepsis Labs Invalid input(s): PROCALCITONIN,  WBC,  LACTICIDVEN Microbiology Recent Results (from the past 240 hour(s))  Culture, blood (routine x 2)     Status: None (Preliminary result)   Collection Time: 10/07/16 12:28 AM  Result Value Ref Range Status   Specimen Description BLOOD LEFT ANTECUBITAL  Final   Special Requests BOTTLES DRAWN AEROBIC AND ANAEROBIC 5CC EA  Final   Culture NO GROWTH 1 DAY  Final   Report Status PENDING  Incomplete  Respiratory Panel by PCR     Status: None   Collection Time: 10/07/16 12:31 AM  Result Value Ref Range Status   Adenovirus NOT DETECTED NOT DETECTED Final   Coronavirus 229E NOT DETECTED NOT DETECTED Final   Coronavirus HKU1 NOT DETECTED NOT DETECTED Final   Coronavirus NL63 NOT DETECTED NOT DETECTED Final   Coronavirus OC43 NOT DETECTED NOT DETECTED Final   Metapneumovirus NOT DETECTED NOT DETECTED Final   Rhinovirus / Enterovirus NOT DETECTED NOT DETECTED Final   Influenza A NOT DETECTED NOT DETECTED Final   Influenza B NOT DETECTED NOT DETECTED Final    Parainfluenza Virus 1 NOT DETECTED NOT DETECTED Final   Parainfluenza Virus 2 NOT DETECTED NOT DETECTED Final   Parainfluenza Virus 3 NOT DETECTED NOT DETECTED Final   Parainfluenza Virus 4 NOT DETECTED NOT DETECTED Final   Respiratory Syncytial Virus NOT DETECTED NOT DETECTED Final   Bordetella pertussis NOT DETECTED NOT DETECTED Final   Chlamydophila pneumoniae NOT DETECTED NOT DETECTED Final   Mycoplasma pneumoniae NOT DETECTED NOT DETECTED Final  Culture, blood (routine x 2)     Status: None (Preliminary result)   Collection Time: 10/07/16 12:32 AM  Result Value Ref Range Status   Specimen Description BLOOD RIGHT ARM  Final   Special Requests IN PEDIATRIC BOTTLE 3CC  Final   Culture NO GROWTH 1 DAY  Final   Report Status PENDING  Incomplete  Urine culture     Status: Abnormal   Collection Time: 10/07/16  2:05 AM  Result Value Ref Range Status   Specimen Description URINE, RANDOM  Final   Special Requests NONE  Final   Culture <10,000 COLONIES/mL INSIGNIFICANT GROWTH (A)  Final   Report Status 10/08/2016 FINAL  Final  MRSA PCR Screening     Status: None   Collection Time: 10/07/16 11:08 AM  Result Value Ref Range Status   MRSA by PCR NEGATIVE NEGATIVE Final    Comment:        The GeneXpert MRSA Assay (FDA approved for NASAL specimens only), is one component of a comprehensive MRSA colonization surveillance program. It is not intended to diagnose MRSA infection nor to guide or monitor treatment for MRSA infections.      Time coordinating discharge: Over 30 minutes  SIGNED:   Louellen Molder, MD  [facility] 10/09/2016, 9:51 AM Pager   If 7PM-7AM, please contact night-coverage [email or web address] Password [email or web address]

## 2016-10-09 NOTE — Progress Notes (Signed)
Discharge instructions reviewed with patient. Patient has no questions at this time. Pt going home with husband who is bringing home 02. Prescription given to patient. IV d/c. VS Stable. Pt denies pain.

## 2016-10-09 NOTE — Telephone Encounter (Signed)
Patient is still admitted as of 10/09/2016.  Will forward to [place] to follow up on.

## 2016-10-09 NOTE — Progress Notes (Signed)
SATURATION QUALIFICATIONS: (This note is used to comply with regulatory documentation for home oxygen)  Patient Saturations on Room Air at Rest = 96 %  Patient Saturations on Room Air while Ambulating = 87%  Patient Saturations on 2 Liters of oxygen while Ambulating = 93 %  Please briefly explain why patient needs home oxygen: 

## 2016-10-10 ENCOUNTER — Telehealth: Payer: Self-pay | Admitting: *Deleted

## 2016-10-10 ENCOUNTER — Other Ambulatory Visit: Payer: Self-pay | Admitting: Family Medicine

## 2016-10-10 ENCOUNTER — Encounter: Payer: Self-pay | Admitting: Family Medicine

## 2016-10-10 ENCOUNTER — Encounter: Payer: Self-pay | Admitting: Thoracic Surgery (Cardiothoracic Vascular Surgery)

## 2016-10-10 ENCOUNTER — Ambulatory Visit: Payer: BLUE CROSS/BLUE SHIELD | Admitting: Physician Assistant

## 2016-10-10 MED ORDER — CYCLOBENZAPRINE HCL 10 MG PO TABS: 10.0000 mg | ORAL_TABLET | Freq: Three times a day (TID) | ORAL | 0 refills | Status: DC | PRN

## 2016-10-10 NOTE — Telephone Encounter (Signed)
Transition Care Management Follow-up Telephone Call  Per Discharge Summary:  Admit date: 10/06/2016 Discharge date: 10/09/2016  Admitted From: Home Disposition:  Home   Recommendations for Outpatient Follow-up:  1. Follow up with PCP in 1-2 weeks 2. Patient is being discharged on Xarelto for anticoagulation for bilateral PE and DVT. Recommended duration of treatment is for at least 6 months. ( stop date: 04/06/2017) 3. Patient will follow-up with her pulmonologist as outpatient and will reschedule planned outpatient lung biopsy and HRCT.  Home Health: None Equipment/Devices: Oxygen (2 L via nasal cannula on ambulation)  Discharge Condition: Fair CODE STATUS: Full code Diet recommendation: Regular  --   How have you been since you were released from the hospital? "Great, it was so nice to get in my own shower and my own bed."   Do you understand why you were in the hospital? yes   Do you understand the discharge instructions? yes   Where were you discharged to? Home   Items Reviewed:  Medications reviewed: yes  Allergies reviewed: yes  Dietary changes reviewed: yes  Referrals reviewed: yes, follow-up w/ pulmonology   Functional Questionnaire:   Activities of Daily Living (ADLs):   She states they are independent in the following: ambulation, bathing and hygiene, feeding, continence, grooming, toileting and dressing States they require assistance with the following: none, but does need extra time/frequent rest breaks and supplemental O2 after activities   Any transportation issues/concerns?: no   Any patient concerns? yes, she was previously taking fiorinal w/ codeine for migraines, but hospital recommended she d/c this. Is there an alternative medication she could take? Tylenol typically has not worked for her in the past. Please advise the pt via MyChart or phone prior to appt next week if possible.   Confirmed importance and date/time of follow-up visits  scheduled yes  Provider Appointment booked with Dr. Annye Asa 10/18/15 @ 11:00am  Confirmed with patient if condition begins to worsen call PCP or go to the ER.  Patient was given the office number and encouraged to call back with question or concerns.  : yes

## 2016-10-12 LAB — CULTURE, BLOOD (ROUTINE X 2)
CULTURE: NO GROWTH
Culture: NO GROWTH

## 2016-10-14 ENCOUNTER — Encounter: Payer: Self-pay | Admitting: Internal Medicine

## 2016-10-14 NOTE — Telephone Encounter (Signed)
MR  Please see pt. email

## 2016-10-15 NOTE — Telephone Encounter (Signed)
Ok to take OTC cough mediciation as directed in bootle. But if any side effects stop  Dr. Brand Males, M.D., [facility].C.P Pulmonary and Critical Care Medicine Staff Physician [place] [facility] Pager: [phone], If no answer or between  15:00h - 7:00h: call [phone]  10/15/2016 2:04 PM

## 2016-10-16 ENCOUNTER — Ambulatory Visit (INDEPENDENT_AMBULATORY_CARE_PROVIDER_SITE_OTHER)
Admission: RE | Admit: 2016-10-16 | Discharge: 2016-10-16 | Disposition: A | Discharge status: Home / Self Care | Payer: BLUE CROSS/BLUE SHIELD | Source: Ambulatory Visit | Attending: Adult Health | Admitting: Adult Health

## 2016-10-16 ENCOUNTER — Encounter: Payer: Self-pay | Admitting: Adult Health

## 2016-10-16 ENCOUNTER — Ambulatory Visit (INDEPENDENT_AMBULATORY_CARE_PROVIDER_SITE_OTHER): Payer: BLUE CROSS/BLUE SHIELD | Admitting: Adult Health

## 2016-10-16 ENCOUNTER — Telehealth: Payer: Self-pay | Admitting: Adult Health

## 2016-10-16 ENCOUNTER — Other Ambulatory Visit (INDEPENDENT_AMBULATORY_CARE_PROVIDER_SITE_OTHER): Payer: Self-pay

## 2016-10-16 ENCOUNTER — Encounter: Payer: Self-pay | Admitting: Family Medicine

## 2016-10-16 VITALS — BP 138/80 | HR 99 | Temp 98.3°F | Ht 64.0 in | Wt 232.2 lb

## 2016-10-16 DIAGNOSIS — I2699 Other pulmonary embolism without acute cor pulmonale: Secondary | ICD-10-CM | POA: Diagnosis not present

## 2016-10-16 DIAGNOSIS — R0602 Shortness of breath: Secondary | ICD-10-CM | POA: Diagnosis not present

## 2016-10-16 DIAGNOSIS — I2609 Other pulmonary embolism with acute cor pulmonale: Secondary | ICD-10-CM

## 2016-10-16 DIAGNOSIS — J849 Interstitial pulmonary disease, unspecified: Secondary | ICD-10-CM

## 2016-10-16 DIAGNOSIS — R05 Cough: Secondary | ICD-10-CM | POA: Diagnosis not present

## 2016-10-16 LAB — CBC WITH DIFFERENTIAL/PLATELET
BASOS ABS: 0.1 10*3/uL (ref 0.0–0.1)
Basophils Relative: 0.7 % (ref 0.0–3.0)
Eosinophils Absolute: 0.2 10*3/uL (ref 0.0–0.7)
Eosinophils Relative: 2.3 % (ref 0.0–5.0)
HCT: 40.2 % (ref 36.0–46.0)
Hemoglobin: 13.5 g/dL (ref 12.0–15.0)
LYMPHS ABS: 2.2 10*3/uL (ref 0.7–4.0)
Lymphocytes Relative: 28.5 % (ref 12.0–46.0)
MCHC: 33.7 g/dL (ref 30.0–36.0)
MCV: 80.1 fl (ref 78.0–100.0)
MONO ABS: 0.5 10*3/uL (ref 0.1–1.0)
MONOS PCT: 7.1 % (ref 3.0–12.0)
NEUTROS ABS: 4.7 10*3/uL (ref 1.4–7.7)
NEUTROS PCT: 61.4 % (ref 43.0–77.0)
PLATELETS: 283 10*3/uL (ref 150.0–400.0)
RBC: 5.02 Mil/uL (ref 3.87–5.11)
RDW: 14.6 % (ref 11.5–15.5)
WBC: 7.7 10*3/uL (ref 4.0–10.5)

## 2016-10-16 NOTE — Telephone Encounter (Signed)
Patient returned call, CB is [phone].

## 2016-10-16 NOTE — Patient Instructions (Signed)
Continue on Xarelto starter as directed then transition 20mg  daily .  Use Oxygen 2l/m with activiyt .  May use Saline Nasal spray As needed   May use Saline Nasal gel -AYR - At bedtime  As needed   Follow up Dr. Chase Caller in 2 weeks and As needed   Please contact office for sooner follow up if symptoms do not improve or worsen or seek emergency care

## 2016-10-16 NOTE — Telephone Encounter (Signed)
[id number] x1 

## 2016-10-16 NOTE — Telephone Encounter (Signed)
Spoke with pt, who states during her OV with TP today, resuming rehab for her shoulder was dicussed. Pt is requesting a letter stating that she can resume rehab.  TP please advise. Thanks.

## 2016-10-16 NOTE — Progress Notes (Signed)
@Patient  ID: Stacey Alvarez, female    DOB: 1956/05/12, 61 y.o.   MRN: [id number]  No chief complaint on file.   Referring provider: Midge Minium, MD  HPI: 61 yo female followed for ILD , lung nodule , and new dx PE (provoked) in 09/2016.   TEST  10/03/2016 HRCT Chest >Interval progression of fibrotic interstitial lung disease characterized by peribronchovascular and subpleural ground-glass attenuation and reticulation with associated mild parenchymal distortion and mild traction bronchiectasis. Mild patchy air trapping. No frank honeycombing. No clear basilar gradient. This spectrum of findings is most consistent with chronic hypersensitivity pneumonitis. Progression of findings is not typical of fibrotic nonspecific interstitial pneumonia (NSIP). These findings are not consistent with usual interstitial pneumonia (UIP). CT chest 10/07/16 >bilateral PE  Echo 09/2016 >nml LV fxn , no pulm htn  Ven Doppler 09/2016 >bilateral DVT (tibial Letitia Caul )    10/16/16  Follow up : PE /DVT post hospital follow up  Patient presents for a post hospital follow-up. Patient was admitted last week for progressively worsening shortness of breath. She was found to have bilateral pulmonary emboli and DVT. Patient had recent shoulder surgery and had been sedentary along with taking chronic Premarin. It was felt this was a provoked event. She was treated with heparin and transitioned to oral Xarelto. With plans for 6 months off treatment. Since discharge. Patient is improving with less shortness of breath. However, she remains weak and tires easily. She was started on oxygen and uses with activity. Today in the office. She did not bring her oxygen and O2 sats are 96%. She denies any hemoptysis, chest pain, orthopnea, PND, or increased leg swelling Does complain of nasal congestion and dryness.    Allergies  Allergen Reactions  . Prednisone     pancreatitis  . Macrobid [Nitrofurantoin Monohyd Macro]  Diarrhea    Diarrhea, GI upset  . Sulfa Antibiotics Rash    Immunization History  Administered Date(s) Administered  . Influenza,inj,Quad PF,36+ Mos 06/16/2013, 06/12/2016  . Influenza-Unspecified 09/16/2014, 11/19/2015  . Tdap 10/28/2005, 11/24/2015    Past Medical History:  Diagnosis Date  . Allergy   . Anxiety   . Arthritis    KNEES,BACK,HIPS  . Depression   . Fibromyalgia   . GERD (gastroesophageal reflux disease)   . Hyperlipidemia   . Hypertension   . IBS (irritable bowel syndrome)   . Interstitial lung disease ([place])   . Sleep apnea     Tobacco History: History  Smoking Status  . Former Smoker  Smokeless Tobacco  . Never Used    Comment: smoked 2 years in late teens about 8-10 cigs daily.    Counseling given: Not Answered   Outpatient Encounter Prescriptions as of 10/16/2016  Medication Sig  . ALPRAZolam (XANAX) 1 MG tablet 0.5 mg in the am and 2mg  at night  . amLODipine (NORVASC) 5 MG tablet Take 1 tablet by mouth at  bedtime  . cetirizine (ZYRTEC) 10 MG tablet Take 10 mg by mouth daily.  . cyclobenzaprine (FLEXERIL) 10 MG tablet Take 1 tablet (10 mg total) by mouth 3 (three) times daily as needed for muscle spasms.  . fenofibrate 160 MG tablet TAKE 1 TABLET BY MOUTH  DAILY  . FLUoxetine (PROZAC) 20 MG tablet Take 40 mg by mouth daily.  . fluticasone (FLONASE) 50 MCG/ACT nasal spray Use 2 sprays in each  nostril daily  . HYDROcodone-acetaminophen (NORCO) 10-325 MG tablet Take 1 tablet by mouth every 6 (six) hours as needed.  Marland Kitchen  lamoTRIgine (LAMICTAL) 100 MG tablet Take 100 mg by mouth daily.  Marland Kitchen lisinopril (PRINIVIL,ZESTRIL) 20 MG tablet TAKE 1 TABLET BY MOUTH TWO  TIMES DAILY  . OXYGEN Inhale 2 L into the lungs as needed (SOB).  . pravastatin (PRAVACHOL) 20 MG tablet TAKE 1 TABLET(20 MG) BY MOUTH DAILY  . promethazine (PHENERGAN) 25 MG tablet Take 1 tablet (25 mg total) by mouth every 8 (eight) hours as needed for nausea or vomiting.  . ranitidine (ZANTAC)  150 MG tablet Take 1 tablet (150 mg total) by mouth 2 (two) times daily.  . Rivaroxaban 15 & 20 MG TBPK Take as directed on package: Start with one 15mg  tablet by mouth twice a day with food. On Day 22, switch to one 20mg  tablet once a day with food.  . temazepam (RESTORIL) 30 MG capsule Take 30 mg by mouth at bedtime.   No facility-administered encounter medications on file as of 10/16/2016.      Review of Systems  Constitutional:   No  weight loss, night sweats,  Fevers, chills,  +fatigue, or  lassitude.  HEENT:   No headaches,  Difficulty swallowing,  Tooth/dental problems, or  Sore throat,                No sneezing, itching, ear ache,  +nasal congestion, post nasal drip,   CV:  No chest pain,  Orthopnea, PND, swelling in lower extremities, anasarca, dizziness, palpitations, syncope.   GI  No heartburn, indigestion, abdominal pain, nausea, vomiting, diarrhea, change in bowel habits, loss of appetite, bloody stools.   Resp:    No wheezing.  No chest wall deformity  Skin: no rash or lesions.  GU: no dysuria, change in color of urine, no urgency or frequency.  No flank pain, no hematuria   MS:  No joint pain or swelling.  No decreased range of motion.  No back pain.    Physical Exam  BP 138/80 (BP Location: Left Arm, Cuff Size: Normal)   Pulse 99   Temp 98.3 F (36.8 C) (Oral)   Ht 5\' 4"  (1.626 m)   Wt 232 lb 3.2 oz (105.3 kg)   SpO2 96%   BMI 39.86 kg/m   GEN: A/Ox3; pleasant , NAD    HEENT:  [place]/AT,  EACs-clear, TMs-wnl, NOSE-clear, THROAT-clear, no lesions, no postnasal drip or exudate noted.   NECK:  Supple w/ fair ROM; no JVD; normal carotid impulses w/o bruits; no thyromegaly or nodules palpated; no lymphadenopathy.    RESP  Clear  P & A; w/o, wheezes/ rales/ or rhonchi. no accessory muscle use, no dullness to percussion  CARD:  RRR, no m/r/g, no peripheral edema, pulses intact, no cyanosis or clubbing.  GI:   Soft & nt; nml bowel sounds; no organomegaly or  masses detected.   Musco: Warm bil, no deformities or joint swelling noted.   Neuro: alert, no focal deficits noted.    Skin: Warm, no lesions or rashes  Psych:  No change in mood or affect. No depression or anxiety.  No memory loss.  Lab Results:  CBC    Component Value Date/Time   WBC 7.7 10/16/2016 1232   RBC 5.02 10/16/2016 1232   HGB 13.5 10/16/2016 1232   HCT 40.2 10/16/2016 1232   PLT 283.0 10/16/2016 1232   MCV 80.1 10/16/2016 1232   MCH 27.0 10/08/2016 0032   MCHC 33.7 10/16/2016 1232   RDW 14.6 10/16/2016 1232   LYMPHSABS 2.2 10/16/2016 1232   MONOABS 0.5 10/16/2016  1232   EOSABS 0.2 10/16/2016 1232   BASOSABS 0.1 10/16/2016 1232    BMET    Component Value Date/Time   NA 141 10/07/2016 0541   K 4.0 10/07/2016 0541   CL 109 10/07/2016 0541   CO2 20 (L) 10/07/2016 0541   GLUCOSE 201 (H) 10/07/2016 0541   BUN 10 10/07/2016 0541   CREATININE 0.77 10/07/2016 0541   CALCIUM 8.3 (L) 10/07/2016 0541   GFRNONAA >60 10/07/2016 0541   GFRAA >60 10/07/2016 0541    BNP    Component Value Date/Time   BNP 24.8 10/07/2016 0542    ProBNP No results found for: PROBNP  Imaging: Dg Chest 2 View  Result Date: 10/16/2016 CLINICAL DATA:  Shortness of breath, dry cough for 6 weeks EXAM: CHEST  2 VIEW COMPARISON:  10/06/2016 FINDINGS: Mild elevation of the right hemidiaphragm. Linear densities in the left base likely reflects atelectasis or scarring. Heart is borderline in size. No effusions or acute bony abnormality. IMPRESSION: Linear left basilar atelectasis or scarring. Stable mild elevation of the right hemidiaphragm. Electronically Signed   By: Rolm Baptise M.D.   On: 10/16/2016 13:10   Dg Chest 2 View  Result Date: 10/06/2016 CLINICAL DATA:  61 year old female with shortness of breath. EXAM: CHEST  2 VIEW COMPARISON:  Chest CT dated 10/03/2016 FINDINGS: There are bibasilar streaky densities, left greater right corresponding to the ground-glass and reticular  densities noted on the CT and possibly related to hypersensitivity pneumonitis. Developing infiltrate is not entirely excluded. There is no focal consolidation, pleural effusion, or pneumothorax. The cardiac silhouette is within normal limits. No acute osseous pathology identified. Right upper quadrant cholecystectomy clips noted. IMPRESSION: Bibasilar streaky densities, left greater right may represent hypersensitivity pneumonitis. Developing infiltrate not excluded. Clinical correlation is recommended. Electronically Signed   By: Anner Crete M.D.   On: 10/06/2016 23:40   Ct Angio Chest Pe W Or Wo Contrast  Result Date: 10/07/2016 CLINICAL DATA:  61 year old female with shortness of breath and positive D-dimer. EXAM: CT ANGIOGRAPHY CHEST WITH CONTRAST TECHNIQUE: Multidetector CT imaging of the chest was performed using the standard protocol during bolus administration of intravenous contrast. Multiplanar CT image reconstructions and MIPs were obtained to evaluate the vascular anatomy. CONTRAST:  100 cc Isovue 370 COMPARISON:  Chest radiograph dated 10/06/2016 and CT dated 10/03/2016 FINDINGS: Cardiovascular: There is mild cardiomegaly. There is enlargement of the right cardiac chamber compatible with right cardiac straining. The R weeks/LV diameter ratio is 4.7 cm/2.2 cm. There is no pericardial effusion. The thoracic aorta is unremarkable. The origins of the great vessels of the aortic arch appear patent. There is pulmonary artery embolus involving the lobar and segmental branches of the right lower lobe as well as right upper lobe. Left upper lobe segmental pulmonary artery embolus is also noted. Evaluation is limited due to suboptimal opacification of the peripheral branches and streak artifact caused by patient's arms. Mediastinum/Nodes: There is no hilar or mediastinal adenopathy. Esophagus and the thyroid gland are grossly unremarkable. Lungs/Pleura: Bilateral streaky interstitial densities as seen  on the prior study primarily involving the lung bases may be combination of atelectasis/ scarring and underlying interstitial lung disease and hypersensitivity pneumonitis. Clinical correlation is recommended. There is no focal consolidation, pleural effusion, or pneumothorax. The central airways are patent. Upper Abdomen: No acute abnormality. Musculoskeletal: No chest wall abnormality. No acute or significant osseous findings. Review of the MIP images confirms the above findings. IMPRESSION: Positive for acute PE with CT evidence of right  heart strain (RV/LV Ratio = 2) consistent with at least submassive (intermediate risk) PE. The presence of right heart strain has been associated with an increased risk of morbidity and mortality. Please activate Code PE by paging [phone]. These results were called by telephone at the time of interpretation on 10/07/2016 at 2:50 am to Dr. Arlean Hopping , who verbally acknowledged these results. Electronically Signed   By: Anner Crete M.D.   On: 10/07/2016 02:59   Ct Chest High Resolution  Result Date: 10/03/2016 CLINICAL DATA:  Worsening dyspnea for 2 weeks. Follow-up prior high-resolution chest CT findings suggestive of interstitial lung disease. EXAM: CT CHEST WITHOUT CONTRAST TECHNIQUE: Multidetector CT imaging of the chest was performed following the standard protocol without intravenous contrast. High resolution imaging of the lungs, as well as inspiratory and expiratory imaging, was performed. COMPARISON:  04/10/2016 high-resolution chest CT. FINDINGS: Cardiovascular: Top-normal heart size. No significant pericardial fluid/thickening. Left anterior descending coronary atherosclerosis. Mildly atherosclerotic nonaneurysmal thoracic aorta. Normal caliber pulmonary arteries (main pulmonary artery diameter 2.9 cm). Mediastinum/Nodes: Hypodense 0.4 cm posterior right thyroid lobe nodule. Unremarkable esophagus. No pathologically enlarged axillary, mediastinal or gross  hilar lymph nodes, noting limited sensitivity for the detection of hilar adenopathy on this noncontrast study. Lungs/Pleura: No pneumothorax. No pleural effusion. Subpleural posteromedial left lower lobe 4 mm solid pulmonary nodule (series 8/ image 55), stable since 04/10/2016, considered benign. No acute consolidative airspace disease, lung masses or new significant pulmonary nodules. There is patchy ground-glass attenuation and reticulation involving the peribronchovascular and subpleural lungs bilaterally, slightly asymmetrically more prominent in the left lung. There is associated mild parenchymal distortion and mild traction bronchiectasis throughout both lungs. Areas of involvement of the posterior lungs persist on the prone sequences. There is no clear basilar gradient. There is no frank honeycombing. These findings have progressed since 04/10/2016. There is mild patchy air trapping throughout both lungs on the expiration sequence. Upper abdomen: Cholecystectomy. Musculoskeletal: No aggressive appearing focal osseous lesions. Moderate thoracic spondylosis. IMPRESSION: 1. Interval progression of fibrotic interstitial lung disease characterized by peribronchovascular and subpleural ground-glass attenuation and reticulation with associated mild parenchymal distortion and mild traction bronchiectasis. Mild patchy air trapping. No frank honeycombing. No clear basilar gradient. This spectrum of findings is most consistent with chronic hypersensitivity pneumonitis. Progression of findings is not typical of fibrotic nonspecific interstitial pneumonia (NSIP). These findings are not consistent with usual interstitial pneumonia (UIP). 2. Aortic atherosclerosis.  One vessel coronary atherosclerosis. Electronically Signed   By: Ilona Sorrel M.D.   On: 10/03/2016 14:00     Assessment & Plan:   Bilateral pulmonary embolism (HCC) Provoked bilateral PE /DVT (recent shoulder surgery /premarin)  tx for 6 month of  Xarelto   Plan  Patient Instructions  Continue on Xarelto starter as directed then transition 20mg  daily .  Use Oxygen 2l/m with activiyt .  May use Saline Nasal spray As needed   May use Saline Nasal gel -AYR - At bedtime  As needed   Follow up Dr. Chase Caller in 2 weeks and As needed   Please contact office for sooner follow up if symptoms do not improve or worsen or seek emergency care      ILD (interstitial lung disease) ([place]) Cont on oxygen 2l/m with activity  Will follow up in 2 weeks  Can discuss further evaluation and repeat CT chest going forward on return ov     Clarice Zulauf NP - C

## 2016-10-17 ENCOUNTER — Encounter: Payer: Self-pay | Admitting: Adult Health

## 2016-10-17 ENCOUNTER — Encounter: Payer: Self-pay | Admitting: *Deleted

## 2016-10-17 ENCOUNTER — Ambulatory Visit: Payer: Self-pay | Admitting: Family Medicine

## 2016-10-17 NOTE — Assessment & Plan Note (Signed)
Provoked bilateral PE /DVT (recent shoulder surgery /premarin)  tx for 6 month of Xarelto   Plan  Patient Instructions  Continue on Xarelto starter as directed then transition 20mg  daily .  Use Oxygen 2l/m with activiyt .  May use Saline Nasal spray As needed   May use Saline Nasal gel -AYR - At bedtime  As needed   Follow up Dr. Chase Caller in 2 weeks and As needed   Please contact office for sooner follow up if symptoms do not improve or worsen or seek emergency care

## 2016-10-17 NOTE — Assessment & Plan Note (Signed)
Cont on oxygen 2l/m with activity  Will follow up in 2 weeks  Can discuss further evaluation and repeat CT chest going forward on return ov

## 2016-10-17 NOTE — Telephone Encounter (Signed)
That is fine please see if you can get fax number that we can send to .

## 2016-10-17 NOTE — Telephone Encounter (Addendum)
Spoke with pt. She is aware that we will write this letter for her. Pt would like to have this mailed her to home address. Letter has been written and stamped with TP's signature. It has been placed in the outgoing mail. Nothing further was needed.

## 2016-10-21 ENCOUNTER — Telehealth: Payer: Self-pay | Admitting: Internal Medicine

## 2016-10-21 ENCOUNTER — Encounter: Payer: Self-pay | Admitting: Family Medicine

## 2016-10-21 NOTE — Telephone Encounter (Signed)
Spoke with pt's husband, Quita Skye. The pt currently has a chest cold. She was going to take Mucinex but she was not sure if she could due to being on Xarelto.  MR - please advise if these medications can be taken together. Thanks.

## 2016-10-22 NOTE — Telephone Encounter (Signed)
Spoke with pt's husband. States that they already spoke with pt's PCP and found out it was safe to take. Nothing further was needed.

## 2016-10-22 NOTE — Telephone Encounter (Signed)
I ran the interactions on a database search and no known interactions idenfitied. So ok to take  Dr. Brand Males, M.D., [facility].C.P Pulmonary and Critical Care Medicine Staff Physician [place] [facility] Pager: [phone], If no answer or between  15:00h - 7:00h: call [phone]  10/22/2016 1:35 PM

## 2016-10-23 ENCOUNTER — Ambulatory Visit: Payer: Self-pay | Admitting: Cardiology

## 2016-10-23 DIAGNOSIS — M25611 Stiffness of right shoulder, not elsewhere classified: Secondary | ICD-10-CM | POA: Diagnosis not present

## 2016-10-23 DIAGNOSIS — M25511 Pain in right shoulder: Secondary | ICD-10-CM | POA: Diagnosis not present

## 2016-10-23 DIAGNOSIS — R311 Benign essential microscopic hematuria: Secondary | ICD-10-CM | POA: Diagnosis not present

## 2016-10-28 ENCOUNTER — Other Ambulatory Visit: Payer: Self-pay | Admitting: Family Medicine

## 2016-10-28 DIAGNOSIS — M25511 Pain in right shoulder: Secondary | ICD-10-CM | POA: Diagnosis not present

## 2016-10-28 DIAGNOSIS — M25611 Stiffness of right shoulder, not elsewhere classified: Secondary | ICD-10-CM | POA: Diagnosis not present

## 2016-10-29 ENCOUNTER — Ambulatory Visit (INDEPENDENT_AMBULATORY_CARE_PROVIDER_SITE_OTHER): Payer: BLUE CROSS/BLUE SHIELD | Admitting: Internal Medicine

## 2016-10-29 ENCOUNTER — Encounter: Payer: Self-pay | Admitting: Internal Medicine

## 2016-10-29 VITALS — BP 118/72 | HR 104 | Ht 64.0 in | Wt 237.0 lb

## 2016-10-29 DIAGNOSIS — J849 Interstitial pulmonary disease, unspecified: Secondary | ICD-10-CM | POA: Diagnosis not present

## 2016-10-29 DIAGNOSIS — R0600 Dyspnea, unspecified: Secondary | ICD-10-CM

## 2016-10-29 DIAGNOSIS — I2699 Other pulmonary embolism without acute cor pulmonale: Secondary | ICD-10-CM | POA: Diagnosis not present

## 2016-10-29 NOTE — Patient Instructions (Addendum)
ICD-9-CM ICD-10-CM   1. Dyspnea, unspecified type 786.09 R06.00   2. ILD (interstitial lung disease) ([place]) 515 J84.9   3. Bilateral pulmonary embolism (HCC) 415.19 I26.99     Continue xarelto and down to 20mg  per day per pre-prescribed protocol Monitor your shortness of breath Use oxygen to keep pulse ox > 88% - right now you will need it for anything more than 700 yards or so Will moniitor ILD clinically and consider CT in July 2018 depending on followup  Followup 3 months or sooner if needed

## 2016-10-29 NOTE — Progress Notes (Signed)
Subjective:     Patient ID: Stacey Alvarez, female   DOB: 1956-04-18, 61 y.o.   MRN: [id number]  HPI      OV 10/29/2016  Chief Complaint  Patient presents with  . Follow-up    ILD-much better, uses the O2 sparingly, she can do things without being SOB   FU ILD since fall 2018 with worsening changes on HRCT 10/03/16 and few days later new onset PE 10/07/16   She presents with her husband. She is now on anticoagulation with Xarelto. Her dyspnea is improved. She feels much better. She is still worried about the underlying interstitial lung disease and the potential need for lung biopsy which she hopes to avoid. There are no new symptoms. There is no hemoptysis. There is no bleeding issues. This no complications with Xarelto. She's not having any side effects with Xarelto. She is tolerating it just fine. She remains off Premarin  Walking desat tst: desaturated on 3rd lapt to 89% - improved though still abnormal.   Her husband is here with her today    has a past medical history of Allergy; Anxiety; Arthritis; Depression; Fibromyalgia; GERD (gastroesophageal reflux disease); Hyperlipidemia; Hypertension; IBS (irritable bowel syndrome); Interstitial lung disease ([place]); and Sleep apnea.   reports that she has quit smoking. She has never used smokeless tobacco.  Past Surgical History:  Procedure Laterality Date  . CHOLECYSTECTOMY    . COLONOSCOPY    . PARTIAL HYSTERECTOMY  1999  . POLYPECTOMY      Allergies  Allergen Reactions  . Prednisone     pancreatitis  . Macrobid [Nitrofurantoin Monohyd Macro] Diarrhea    Diarrhea, GI upset  . Sulfa Antibiotics Rash    Immunization History  Administered Date(s) Administered  . Influenza,inj,Quad PF,36+ Mos 06/16/2013, 06/12/2016  . Influenza-Unspecified 09/16/2014, 11/19/2015  . Tdap 10/28/2005, 11/24/2015    Family History  Problem Relation Age of Onset  . Diabetes Mother   . Heart disease Mother   . Anxiety disorder Mother   .  Depression Mother   . Kidney disease Mother   . Diabetes Father   . Heart disease Father   . Anxiety disorder Father   . Depression Father   . Diabetes Paternal Grandmother   . Cystic fibrosis Son   . Alcohol abuse Son      Current Outpatient Prescriptions:  .  ALPRAZolam (XANAX) 1 MG tablet, 0.5 mg in the am and 2mg  at night, Disp: , Rfl:  .  amLODipine (NORVASC) 5 MG tablet, TAKE 1 TABLET BY MOUTH AT  BEDTIME, Disp: 90 tablet, Rfl: 1 .  cetirizine (ZYRTEC) 10 MG tablet, Take 10 mg by mouth daily., Disp: , Rfl:  .  cyclobenzaprine (FLEXERIL) 10 MG tablet, Take 1 tablet (10 mg total) by mouth 3 (three) times daily as needed for muscle spasms., Disp: 30 tablet, Rfl: 0 .  fenofibrate 160 MG tablet, TAKE 1 TABLET BY MOUTH  DAILY, Disp: 90 tablet, Rfl: 1 .  FLUoxetine (PROZAC) 20 MG tablet, Take 40 mg by mouth daily., Disp: , Rfl:  .  fluticasone (FLONASE) 50 MCG/ACT nasal spray, Use 2 sprays in each  nostril daily, Disp: 48 g, Rfl: 1 .  lamoTRIgine (LAMICTAL) 100 MG tablet, Take 100 mg by mouth daily., Disp: , Rfl:  .  lisinopril (PRINIVIL,ZESTRIL) 20 MG tablet, TAKE 1 TABLET BY MOUTH TWO  TIMES DAILY, Disp: 180 tablet, Rfl: 0 .  OXYGEN, Inhale 2 L into the lungs as needed (SOB)., Disp: , Rfl:  .  pravastatin (PRAVACHOL) 20 MG tablet, TAKE 1 TABLET(20 MG) BY MOUTH DAILY, Disp: 90 tablet, Rfl: 1 .  promethazine (PHENERGAN) 25 MG tablet, Take 1 tablet (25 mg total) by mouth every 8 (eight) hours as needed for nausea or vomiting., Disp: 45 tablet, Rfl: 0 .  ranitidine (ZANTAC) 150 MG tablet, TAKE 1 TABLET BY MOUTH TWO  TIMES DAILY, Disp: 180 tablet, Rfl: 1 .  Rivaroxaban 15 & 20 MG TBPK, Take as directed on package: Start with one 15mg  tablet by mouth twice a day with food. On Day 22, switch to one 20mg  tablet once a day with food., Disp: 51 each, Rfl: 2 .  temazepam (RESTORIL) 30 MG capsule, Take 30 mg by mouth at bedtime., Disp: , Rfl:     Review of Systems     Objective:   Physical  Exam  Constitutional: She is oriented to person, place, and time. She appears well-developed and well-nourished. No distress.  HENT:  Head: Normocephalic and atraumatic.  Right Ear: External ear normal.  Left Ear: External ear normal.  Mouth/Throat: Oropharynx is clear and moist. No oropharyngeal exudate.  Eyes: Conjunctivae and EOM are normal. Pupils are equal, round, and reactive to light. Right eye exhibits no discharge. Left eye exhibits no discharge. No scleral icterus.  Neck: Normal range of motion. Neck supple. No JVD present. No tracheal deviation present. No thyromegaly present.  Cardiovascular: Normal rate, regular rhythm, normal heart sounds and intact distal pulses.  Exam reveals no gallop and no friction rub.   No murmur heard. Pulmonary/Chest: Effort normal. No respiratory distress. She has no wheezes. She has rales. She exhibits no tenderness.  Abdominal: Soft. Bowel sounds are normal. She exhibits no distension and no mass. There is no tenderness. There is no rebound and no guarding.  Musculoskeletal: Normal range of motion. She exhibits no edema or tenderness.  Lymphadenopathy:    She has no cervical adenopathy.  Neurological: She is alert and oriented to person, place, and time. She has normal reflexes. No cranial nerve deficit. She exhibits normal muscle tone. Coordination normal.  Skin: Skin is warm and dry. No rash noted. She is not diaphoretic. No erythema. No pallor.  Psychiatric: She has a normal mood and affect. Her behavior is normal. Judgment and thought content normal.  Vitals reviewed.   Vitals:   10/29/16 1117  BP: 118/72  Pulse: (!) 104  SpO2: 92%  Weight: 237 lb (107.5 kg)  Height: 5\' 4"  (1.626 m)    Estimated body mass index is 40.68 kg/m as calculated from the following:   Height as of this encounter: 5\' 4"  (1.626 m).   Weight as of this encounter: 237 lb (107.5 kg).      Assessment:       ICD-9-CM ICD-10-CM   1. Dyspnea, unspecified type  786.09 R06.00   2. ILD (interstitial lung disease) ([place]) 515 J84.9   3. Bilateral pulmonary embolism (H[facility]) 415.19 I26.99        Plan:      Given the presence of crackles and the continued presence of exertional desaturation although improved I suspect there is still some interstitial lung disease. At this point in time I recommended continued anticoagulation for the next 6 months before reassessing the duration of anticoagulation needed. I have advised my personal bias based on recent evidenced with prolonged anticoagulation for at least 1 or 2 years. full partial dose. At this point in time we'll hold off any recommendation for surgical lung biopsy but will perhaps address  with a CT scan of the chest in July 2018 which should be the six-month standpoint before making any call on need for surgical lung biopsy. Of note she is very reluctant to have surgical lung biopsy.   I will see her back in 3 months for monitoring of her pulmonary embolism and anticoagulation   She and husband in agreement with the plan. She will use oxygen with monitoring of her pulse ox at meter    (> 50% of this 15 min visit spent in face to face counseling or/and coordination of care)   Dr. Brand Males, M.D., [facility].C.P Pulmonary and Critical Care Medicine Staff Physician [place] [facility] Pager: [phone], If no answer or between  15:00h - 7:00h: call [phone]  10/29/2016 12:03 PM

## 2016-10-30 DIAGNOSIS — M25511 Pain in right shoulder: Secondary | ICD-10-CM | POA: Diagnosis not present

## 2016-10-30 DIAGNOSIS — M25611 Stiffness of right shoulder, not elsewhere classified: Secondary | ICD-10-CM | POA: Diagnosis not present

## 2016-10-30 DIAGNOSIS — R531 Weakness: Secondary | ICD-10-CM | POA: Diagnosis not present

## 2016-11-04 DIAGNOSIS — G4733 Obstructive sleep apnea (adult) (pediatric): Secondary | ICD-10-CM | POA: Diagnosis not present

## 2016-11-05 ENCOUNTER — Telehealth: Payer: Self-pay | Admitting: Internal Medicine

## 2016-11-05 DIAGNOSIS — M25511 Pain in right shoulder: Secondary | ICD-10-CM | POA: Diagnosis not present

## 2016-11-05 DIAGNOSIS — R531 Weakness: Secondary | ICD-10-CM | POA: Diagnosis not present

## 2016-11-05 DIAGNOSIS — M25611 Stiffness of right shoulder, not elsewhere classified: Secondary | ICD-10-CM | POA: Diagnosis not present

## 2016-11-05 MED ORDER — RIVAROXABAN 20 MG PO TABS: 20.0000 mg | ORAL_TABLET | Freq: Every day | ORAL | 5 refills | Status: DC

## 2016-11-05 NOTE — Telephone Encounter (Signed)
Spoke with pt. She is needing a refill on Xarelto. Rx has been sent in. Nothing further was needed.

## 2016-11-06 ENCOUNTER — Other Ambulatory Visit: Payer: Self-pay | Admitting: Family Medicine

## 2016-11-07 DIAGNOSIS — R531 Weakness: Secondary | ICD-10-CM | POA: Diagnosis not present

## 2016-11-07 DIAGNOSIS — M25511 Pain in right shoulder: Secondary | ICD-10-CM | POA: Diagnosis not present

## 2016-11-07 DIAGNOSIS — M25611 Stiffness of right shoulder, not elsewhere classified: Secondary | ICD-10-CM | POA: Diagnosis not present

## 2016-11-08 ENCOUNTER — Telehealth: Payer: Self-pay | Admitting: Internal Medicine

## 2016-11-08 DIAGNOSIS — J849 Interstitial pulmonary disease, unspecified: Secondary | ICD-10-CM

## 2016-11-08 DIAGNOSIS — H0012 Chalazion right lower eyelid: Secondary | ICD-10-CM | POA: Diagnosis not present

## 2016-11-08 DIAGNOSIS — R911 Solitary pulmonary nodule: Secondary | ICD-10-CM

## 2016-11-08 NOTE — Telephone Encounter (Signed)
Spoke with the pt and notified of response per MR  She verbalized understanding  Her plans for travel have not been finalized yet, so will will for for appt later  Nothing further needed at this time

## 2016-11-08 NOTE — Telephone Encounter (Signed)
Thank you.  Please advise what you think about the pt flying in late April?  thanks

## 2016-11-08 NOTE — Telephone Encounter (Signed)
She would need walking test on room air to decide if she needs o2 for flight. That would be 10days befoe travela and if she needs then we would need to do airline o2 form that she can print off website. She might need a visit at that time with an APP  Dr. Brand Males, M.D., [facility].C.P Pulmonary and Critical Care Medicine Staff Physician [place] [facility] Pager: [phone], If no answer or between  15:00h - 7:00h: call [phone]  11/08/2016 3:50 PM

## 2016-11-08 NOTE — Telephone Encounter (Signed)
Yes push CT out to 3 months - HRCT though  Dr. Brand Males, M.D., [facility].C.P Pulmonary and Critical Care Medicine Staff Physician [place] [facility] Pager: [phone], If no answer or between  15:00h - 7:00h: call [phone]  11/08/2016 1:47 PM

## 2016-11-08 NOTE — Telephone Encounter (Signed)
Called and spoke with pt and she stated that at her last OV with MR on 2/13---he stated that we would push the CT scan out 3 months or more.  She wanted to make sure that the CT scheduled for march will be cancelled.  It stated in your last ov note that at her next OV in 3 months you would discuss her next CT and possibly waiting until June for this. Please advise. thanks  Pt also stated that she is going out of town late April or early May and she wanted to make sure that it was ok for her to fly.  MR please advise.

## 2016-11-15 DIAGNOSIS — M25611 Stiffness of right shoulder, not elsewhere classified: Secondary | ICD-10-CM | POA: Diagnosis not present

## 2016-11-15 DIAGNOSIS — M25511 Pain in right shoulder: Secondary | ICD-10-CM | POA: Diagnosis not present

## 2016-11-15 DIAGNOSIS — R531 Weakness: Secondary | ICD-10-CM | POA: Diagnosis not present

## 2016-11-18 ENCOUNTER — Other Ambulatory Visit: Payer: Self-pay | Admitting: Family Medicine

## 2016-11-19 MED ORDER — CYCLOBENZAPRINE HCL 10 MG PO TABS: 10.0000 mg | ORAL_TABLET | Freq: Three times a day (TID) | ORAL | 0 refills | Status: DC | PRN

## 2016-11-19 MED ORDER — PROMETHAZINE HCL 25 MG PO TABS: 25.0000 mg | ORAL_TABLET | Freq: Three times a day (TID) | ORAL | 0 refills | Status: DC | PRN

## 2016-11-19 NOTE — Telephone Encounter (Signed)
Medication filled to pharmacy as requested.   

## 2016-11-28 DIAGNOSIS — R531 Weakness: Secondary | ICD-10-CM | POA: Diagnosis not present

## 2016-11-28 DIAGNOSIS — M25611 Stiffness of right shoulder, not elsewhere classified: Secondary | ICD-10-CM | POA: Diagnosis not present

## 2016-11-28 DIAGNOSIS — M25511 Pain in right shoulder: Secondary | ICD-10-CM | POA: Diagnosis not present

## 2016-12-02 ENCOUNTER — Ambulatory Visit (INDEPENDENT_AMBULATORY_CARE_PROVIDER_SITE_OTHER): Payer: BLUE CROSS/BLUE SHIELD | Admitting: Family Medicine

## 2016-12-02 ENCOUNTER — Encounter: Payer: Self-pay | Admitting: Family Medicine

## 2016-12-02 ENCOUNTER — Other Ambulatory Visit (HOSPITAL_BASED_OUTPATIENT_CLINIC_OR_DEPARTMENT_OTHER): Payer: Self-pay

## 2016-12-02 VITALS — BP 128/84 | HR 81 | Temp 98.0°F | Resp 18 | Ht 64.0 in | Wt 240.5 lb

## 2016-12-02 DIAGNOSIS — G8929 Other chronic pain: Secondary | ICD-10-CM | POA: Diagnosis not present

## 2016-12-02 DIAGNOSIS — I10 Essential (primary) hypertension: Secondary | ICD-10-CM

## 2016-12-02 DIAGNOSIS — G4733 Obstructive sleep apnea (adult) (pediatric): Secondary | ICD-10-CM | POA: Diagnosis not present

## 2016-12-02 DIAGNOSIS — M545 Low back pain, unspecified: Secondary | ICD-10-CM

## 2016-12-02 MED ORDER — HYDROCODONE-ACETAMINOPHEN 5-325 MG PO TABS: 1.0000 | ORAL_TABLET | Freq: Four times a day (QID) | ORAL | 0 refills | Status: DC | PRN

## 2016-12-02 NOTE — Progress Notes (Signed)
   Subjective:    Patient ID: Stacey Alvarez, female    DOB: 1956/01/08, 61 y.o.   MRN: [id number]  HPI HTN- chronic problem, BP at rehab was elevated to 180/116.  Currently BP WNL.  Pt is requiring intermittent O2 for SOB.  Currently on Amlodipine 5mg  and Lisinopril 20mg .  Today's reading is first one that's 'out of whack'.  Back pain- L sided, radiates around to front.  Pain starts at lumbar spine and radiates into pelvis.  Painful to lie down.  Unable to get comfortable.  Pain is constant.  No relief w/ tylenol.  Pt reports pain has been worsening since July but husband states she has only been complaining x1 week.  No relief w/ heat.   Review of Systems For ROS see HPI     Objective:   Physical Exam  Constitutional: She is oriented to person, place, and time. She appears well-developed and well-nourished. No distress.  HENT:  Head: Normocephalic and atraumatic.  Eyes: Conjunctivae and EOM are normal. Pupils are equal, round, and reactive to light.  Neck: Normal range of motion. Neck supple. No thyromegaly present.  Cardiovascular: Normal rate, regular rhythm, normal heart sounds and intact distal pulses.   No murmur heard. Pulmonary/Chest: Effort normal and breath sounds normal. No respiratory distress.  Abdominal: Soft. She exhibits no distension. There is no tenderness.  Musculoskeletal: She exhibits tenderness (TTP over SI joints bilaterally, L>R.). She exhibits no edema.  Lymphadenopathy:    She has no cervical adenopathy.  Neurological: She is alert and oriented to person, place, and time.  (-) SLR bilaterally  Skin: Skin is warm and dry.  Psychiatric: She has a normal mood and affect. Her behavior is normal.  Vitals reviewed.         Assessment & Plan:

## 2016-12-02 NOTE — Patient Instructions (Signed)
Follow up as scheduled (perfect follow up for [place] to talk!) Start the Hydrocodone for pain- no extra tylenol You are very tender over both SI joints Alternate heat/ice for pain relief Call with any questions or concerns Hang in there!  You are both stronger than you know and have my love and support!!!

## 2016-12-02 NOTE — Progress Notes (Signed)
Pre visit review using our clinic review tool, if applicable. No additional management support is needed unless otherwise documented below in the visit note. 

## 2016-12-03 DIAGNOSIS — M7062 Trochanteric bursitis, left hip: Secondary | ICD-10-CM | POA: Diagnosis not present

## 2016-12-03 DIAGNOSIS — M5442 Lumbago with sciatica, left side: Secondary | ICD-10-CM | POA: Diagnosis not present

## 2016-12-03 DIAGNOSIS — M533 Sacrococcygeal disorders, not elsewhere classified: Secondary | ICD-10-CM | POA: Diagnosis not present

## 2016-12-03 DIAGNOSIS — M5441 Lumbago with sciatica, right side: Secondary | ICD-10-CM | POA: Diagnosis not present

## 2016-12-03 DIAGNOSIS — M545 Low back pain: Secondary | ICD-10-CM | POA: Diagnosis not present

## 2016-12-04 ENCOUNTER — Ambulatory Visit: Payer: Self-pay | Admitting: Internal Medicine

## 2016-12-04 NOTE — Assessment & Plan Note (Signed)
Chronic problem.  Currently well controlled but BP was quite high at rehab.  Husband agrees that this is stress related w/ his recent metastatic cancer of tongue.  I fear that if I adjust her BP meds, BP will drop low and pt will be symptomatic.  Will follow closely.  Pt expressed understanding and is in agreement w/ plan.

## 2016-12-04 NOTE — Assessment & Plan Note (Signed)
Recurrent problem.  Pt has appt w/ ortho scheduled for tomorrow.  Start hydrocodone for pain relief while waiting on appt.  Reviewed supportive care and red flags that should prompt return.  Pt expressed understanding and is in agreement w/ plan.

## 2016-12-06 DIAGNOSIS — R0602 Shortness of breath: Secondary | ICD-10-CM | POA: Diagnosis not present

## 2016-12-06 DIAGNOSIS — J849 Interstitial pulmonary disease, unspecified: Secondary | ICD-10-CM | POA: Diagnosis not present

## 2016-12-09 DIAGNOSIS — Z86718 Personal history of other venous thrombosis and embolism: Secondary | ICD-10-CM | POA: Diagnosis not present

## 2016-12-09 DIAGNOSIS — M797 Fibromyalgia: Secondary | ICD-10-CM | POA: Diagnosis not present

## 2016-12-09 DIAGNOSIS — K219 Gastro-esophageal reflux disease without esophagitis: Secondary | ICD-10-CM | POA: Diagnosis not present

## 2016-12-09 DIAGNOSIS — G4733 Obstructive sleep apnea (adult) (pediatric): Secondary | ICD-10-CM | POA: Diagnosis not present

## 2016-12-09 DIAGNOSIS — Z86711 Personal history of pulmonary embolism: Secondary | ICD-10-CM | POA: Diagnosis not present

## 2016-12-09 DIAGNOSIS — R531 Weakness: Secondary | ICD-10-CM | POA: Diagnosis not present

## 2016-12-09 DIAGNOSIS — J849 Interstitial pulmonary disease, unspecified: Secondary | ICD-10-CM | POA: Diagnosis not present

## 2016-12-09 DIAGNOSIS — R0602 Shortness of breath: Secondary | ICD-10-CM | POA: Diagnosis not present

## 2016-12-09 DIAGNOSIS — M359 Systemic involvement of connective tissue, unspecified: Secondary | ICD-10-CM | POA: Diagnosis not present

## 2016-12-09 DIAGNOSIS — M25511 Pain in right shoulder: Secondary | ICD-10-CM | POA: Diagnosis not present

## 2016-12-09 DIAGNOSIS — I2510 Atherosclerotic heart disease of native coronary artery without angina pectoris: Secondary | ICD-10-CM | POA: Diagnosis not present

## 2016-12-09 DIAGNOSIS — Z6841 Body Mass Index (BMI) 40.0 and over, adult: Secondary | ICD-10-CM | POA: Diagnosis not present

## 2016-12-09 DIAGNOSIS — M549 Dorsalgia, unspecified: Secondary | ICD-10-CM | POA: Diagnosis not present

## 2016-12-09 DIAGNOSIS — M25611 Stiffness of right shoulder, not elsewhere classified: Secondary | ICD-10-CM | POA: Diagnosis not present

## 2016-12-09 DIAGNOSIS — F329 Major depressive disorder, single episode, unspecified: Secondary | ICD-10-CM | POA: Diagnosis not present

## 2016-12-10 ENCOUNTER — Encounter: Payer: Self-pay | Admitting: Family Medicine

## 2016-12-11 ENCOUNTER — Encounter: Payer: Self-pay | Admitting: Family Medicine

## 2016-12-11 MED ORDER — PANTOPRAZOLE SODIUM 40 MG PO TBEC: 40.0000 mg | DELAYED_RELEASE_TABLET | Freq: Every day | ORAL | 3 refills | Status: DC

## 2016-12-16 ENCOUNTER — Encounter: Payer: Self-pay | Admitting: Family Medicine

## 2016-12-17 ENCOUNTER — Encounter: Payer: Self-pay | Admitting: Family Medicine

## 2016-12-17 MED ORDER — FENOFIBRATE 160 MG PO TABS: 160.0000 mg | ORAL_TABLET | Freq: Every day | ORAL | 1 refills | Status: DC

## 2016-12-17 MED ORDER — FLUTICASONE PROPIONATE 50 MCG/ACT NA SUSP: 2.0000 | Freq: Every day | NASAL | 1 refills | Status: DC

## 2016-12-22 ENCOUNTER — Encounter: Payer: Self-pay | Admitting: Family Medicine

## 2016-12-23 ENCOUNTER — Other Ambulatory Visit: Payer: Self-pay | Admitting: Family Medicine

## 2016-12-23 MED ORDER — TERCONAZOLE 0.8 % VA CREA: 1.0000 | TOPICAL_CREAM | Freq: Every day | VAGINAL | 0 refills | Status: DC

## 2016-12-23 MED ORDER — CEPHALEXIN 500 MG PO CAPS: 500.0000 mg | ORAL_CAPSULE | Freq: Two times a day (BID) | ORAL | 0 refills | Status: AC

## 2016-12-26 ENCOUNTER — Encounter: Payer: Self-pay | Admitting: Family Medicine

## 2016-12-27 MED ORDER — AMLODIPINE BESYLATE 5 MG PO TABS: 5.0000 mg | ORAL_TABLET | Freq: Every day | ORAL | 1 refills | Status: DC

## 2016-12-27 MED ORDER — LISINOPRIL 20 MG PO TABS: 20.0000 mg | ORAL_TABLET | Freq: Two times a day (BID) | ORAL | 1 refills | Status: DC

## 2017-01-01 DIAGNOSIS — R531 Weakness: Secondary | ICD-10-CM | POA: Diagnosis not present

## 2017-01-01 DIAGNOSIS — M25511 Pain in right shoulder: Secondary | ICD-10-CM | POA: Diagnosis not present

## 2017-01-01 DIAGNOSIS — M25611 Stiffness of right shoulder, not elsewhere classified: Secondary | ICD-10-CM | POA: Diagnosis not present

## 2017-01-02 DIAGNOSIS — G4733 Obstructive sleep apnea (adult) (pediatric): Secondary | ICD-10-CM | POA: Diagnosis not present

## 2017-01-03 ENCOUNTER — Telehealth: Payer: Self-pay | Admitting: Internal Medicine

## 2017-01-03 NOTE — Telephone Encounter (Signed)
Ok thanks.  No problem  Dr. Brand Males, M.D., [facility].C.P Pulmonary and Critical Care Medicine Staff Physician [place] [facility] Pager: [phone], If no answer or between  15:00h - 7:00h: call [phone]  01/03/2017 2:24 PM

## 2017-01-03 NOTE — Telephone Encounter (Signed)
FYI - order was placed for pt to have CT in May & she had follow up visit scheduled for 5/14.  I scheduled her CT for 5/10.  When I called pt to give her CT appt info she stated she has been seeing pulmonologist at [place] asked me to cancel CT & ov follow up visit.  Appts cancelled.

## 2017-01-06 ENCOUNTER — Encounter: Payer: Self-pay | Admitting: Family Medicine

## 2017-01-06 ENCOUNTER — Ambulatory Visit (INDEPENDENT_AMBULATORY_CARE_PROVIDER_SITE_OTHER): Payer: BLUE CROSS/BLUE SHIELD | Admitting: Family Medicine

## 2017-01-06 VITALS — BP 126/86 | HR 81 | Temp 98.1°F | Resp 17 | Ht 64.0 in | Wt 241.5 lb

## 2017-01-06 DIAGNOSIS — Z0000 Encounter for general adult medical examination without abnormal findings: Secondary | ICD-10-CM

## 2017-01-06 DIAGNOSIS — E785 Hyperlipidemia, unspecified: Secondary | ICD-10-CM | POA: Diagnosis not present

## 2017-01-06 LAB — CBC WITH DIFFERENTIAL/PLATELET
BASOS ABS: 0.1 10*3/uL (ref 0.0–0.1)
Basophils Relative: 1.3 % (ref 0.0–3.0)
EOS ABS: 0.1 10*3/uL (ref 0.0–0.7)
Eosinophils Relative: 0.7 % (ref 0.0–5.0)
HEMATOCRIT: 38.1 % (ref 36.0–46.0)
Hemoglobin: 12.7 g/dL (ref 12.0–15.0)
Lymphocytes Relative: 29.1 % (ref 12.0–46.0)
Lymphs Abs: 2.8 10*3/uL (ref 0.7–4.0)
MCHC: 33.4 g/dL (ref 30.0–36.0)
MCV: 82.4 fl (ref 78.0–100.0)
MONO ABS: 0.6 10*3/uL (ref 0.1–1.0)
Monocytes Relative: 6.3 % (ref 3.0–12.0)
NEUTROS ABS: 6.1 10*3/uL (ref 1.4–7.7)
Neutrophils Relative %: 62.6 % (ref 43.0–77.0)
Platelets: 383 10*3/uL (ref 150.0–400.0)
RBC: 4.62 Mil/uL (ref 3.87–5.11)
RDW: 15.8 % — ABNORMAL HIGH (ref 11.5–15.5)
WBC: 9.7 10*3/uL (ref 4.0–10.5)

## 2017-01-06 LAB — LIPID PANEL
CHOL/HDL RATIO: 4
Cholesterol: 225 mg/dL — ABNORMAL HIGH (ref 0–200)
HDL: 64.3 mg/dL (ref >39.00)
LDL CALC: 125 mg/dL — AB (ref 0–99)
NONHDL: 161.07
TRIGLYCERIDES: 178 mg/dL — AB (ref 0.0–149.0)
VLDL: 35.6 mg/dL (ref 0.0–40.0)

## 2017-01-06 LAB — BASIC METABOLIC PANEL
BUN: 21 mg/dL (ref 6–23)
CHLORIDE: 100 meq/L (ref 96–112)
CO2: 29 mEq/L (ref 19–32)
Calcium: 9.9 mg/dL (ref 8.4–10.5)
Creatinine, Ser: 0.71 mg/dL (ref 0.40–1.20)
GFR: 88.33 mL/min (ref >60.00)
Glucose, Bld: 81 mg/dL (ref 70–99)
POTASSIUM: 3.8 meq/L (ref 3.5–5.1)
SODIUM: 139 meq/L (ref 135–145)

## 2017-01-06 LAB — HEPATIC FUNCTION PANEL
ALK PHOS: 59 U/L (ref 39–117)
ALT: 10 U/L (ref 0–35)
AST: 8 U/L (ref 0–37)
Albumin: 4.4 g/dL (ref 3.5–5.2)
BILIRUBIN DIRECT: 0.1 mg/dL (ref 0.0–0.3)
BILIRUBIN TOTAL: 0.3 mg/dL (ref 0.2–1.2)
Total Protein: 6.8 g/dL (ref 6.0–8.3)

## 2017-01-06 LAB — TSH: TSH: 1.48 u[IU]/mL (ref 0.35–4.50)

## 2017-01-06 NOTE — Progress Notes (Signed)
   Subjective:    Patient ID: Stacey Alvarez, female    DOB: 04/24/1956, 61 y.o.   MRN: [id number]  HPI CPE- UTD on colonoscopy, mammo, UTD on immunizations.     Review of Systems Patient reports no vision/ hearing changes, adenopathy,fever, weight change,  persistant/recurrent hoarseness , swallowing issues, chest pain, palpitations, edema, persistant/recurrent cough, hemoptysis, dyspnea (rest/exertional/paroxysmal nocturnal), gastrointestinal bleeding (melena, rectal bleeding), abdominal pain, significant heartburn, bowel changes, GU symptoms (dysuria, hematuria, incontinence), Gyn symptoms (abnormal  bleeding, pain),  syncope, focal weakness, memory loss, numbness & tingling, skin/hair/nail changes, abnormal bruising or bleeding, anxiety, or depression.     Objective:   Physical Exam General Appearance:    Alert, cooperative, no distress, appears stated age  Head:    Normocephalic, without obvious abnormality, atraumatic  Eyes:    PERRL, conjunctiva/corneas clear, EOM's intact, fundi    benign, both eyes  Ears:    Normal TM's and external ear canals, both ears  Nose:   Nares normal, septum midline, mucosa normal, no drainage    or sinus tenderness  Throat:   Lips, mucosa, and tongue normal; teeth and gums normal  Neck:   Supple, symmetrical, trachea midline, no adenopathy;    Thyroid: no enlargement/tenderness/nodules  Back:     Symmetric, no curvature, ROM normal, no CVA tenderness  Lungs:     Clear to auscultation bilaterally, respirations unlabored  Chest Wall:    No tenderness or deformity   Heart:    Regular rate and rhythm, S1 and S2 normal, no murmur, rub   or gallop  Breast Exam:    Deferred to mammo  Abdomen:     Soft, non-tender, bowel sounds active all four quadrants,    no masses, no organomegaly  Genitalia:    Deferred  Rectal:    Extremities:   Extremities normal, atraumatic, no cyanosis or edema  Pulses:   2+ and symmetric all extremities  Skin:   Skin color,  texture, turgor normal, no rashes or lesions  Lymph nodes:   Cervical, supraclavicular, and axillary nodes normal  Neurologic:   CNII-XII intact, normal strength, sensation and reflexes    throughout          Assessment & Plan:

## 2017-01-06 NOTE — Assessment & Plan Note (Signed)
Pt's PE WNL w/ exception of obesity.  UTD on mammo, colonoscopy, immunizations.  Check labs.  Anticipatory guidance provided.  

## 2017-01-06 NOTE — Progress Notes (Signed)
PFT could not be done.

## 2017-01-06 NOTE — Patient Instructions (Signed)
Follow up in 6 months to recheck BP and cholesterol We'll notify you of your lab results and make any changes if needed Continue to work on healthy diet and regular exercise- you look great! Call with any questions or concerns Hang in there!!!  You are stronger than you know!!!

## 2017-01-06 NOTE — Progress Notes (Signed)
Pre visit review using our clinic review tool, if applicable. No additional management support is needed unless otherwise documented below in the visit note. 

## 2017-01-07 DIAGNOSIS — F4322 Adjustment disorder with anxiety: Secondary | ICD-10-CM | POA: Diagnosis not present

## 2017-01-07 DIAGNOSIS — F410 Panic disorder [episodic paroxysmal anxiety] without agoraphobia: Secondary | ICD-10-CM | POA: Diagnosis not present

## 2017-01-10 ENCOUNTER — Other Ambulatory Visit: Payer: Self-pay | Admitting: Family Medicine

## 2017-01-10 ENCOUNTER — Encounter: Payer: Self-pay | Admitting: Family Medicine

## 2017-01-10 MED ORDER — PROMETHAZINE HCL 25 MG PO TABS: 25.0000 mg | ORAL_TABLET | Freq: Three times a day (TID) | ORAL | 1 refills | Status: DC | PRN

## 2017-01-10 MED ORDER — BUTALBITAL-ASA-CAFF-CODEINE 50-325-40-30 MG PO CAPS: ORAL_CAPSULE | ORAL | 0 refills | Status: DC

## 2017-01-10 NOTE — Telephone Encounter (Signed)
Medication filled to pharmacy as requested.   

## 2017-01-10 NOTE — Telephone Encounter (Signed)
Last OV 01/06/17 Fiorinal last filled 08/16/16 #30 with 0

## 2017-01-16 DIAGNOSIS — K219 Gastro-esophageal reflux disease without esophagitis: Secondary | ICD-10-CM | POA: Diagnosis not present

## 2017-01-16 DIAGNOSIS — K222 Esophageal obstruction: Secondary | ICD-10-CM | POA: Diagnosis not present

## 2017-01-16 DIAGNOSIS — K228 Other specified diseases of esophagus: Secondary | ICD-10-CM | POA: Diagnosis not present

## 2017-01-23 ENCOUNTER — Other Ambulatory Visit (HOSPITAL_BASED_OUTPATIENT_CLINIC_OR_DEPARTMENT_OTHER): Payer: Self-pay

## 2017-01-27 ENCOUNTER — Ambulatory Visit: Payer: Self-pay | Admitting: Internal Medicine

## 2017-02-01 DIAGNOSIS — G4733 Obstructive sleep apnea (adult) (pediatric): Secondary | ICD-10-CM | POA: Diagnosis not present

## 2017-02-02 ENCOUNTER — Encounter: Payer: Self-pay | Admitting: Family Medicine

## 2017-02-03 MED ORDER — CYCLOBENZAPRINE HCL 10 MG PO TABS: 10.0000 mg | ORAL_TABLET | Freq: Three times a day (TID) | ORAL | 0 refills | Status: DC | PRN

## 2017-02-17 ENCOUNTER — Encounter: Payer: Self-pay | Admitting: Family Medicine

## 2017-02-23 ENCOUNTER — Other Ambulatory Visit: Payer: Self-pay | Admitting: Family Medicine

## 2017-02-24 MED ORDER — CYCLOBENZAPRINE HCL 10 MG PO TABS: 10.0000 mg | ORAL_TABLET | Freq: Three times a day (TID) | ORAL | 0 refills | Status: DC | PRN

## 2017-02-26 ENCOUNTER — Other Ambulatory Visit: Payer: Self-pay | Admitting: Family Medicine

## 2017-02-27 MED ORDER — BUTALBITAL-ASA-CAFF-CODEINE 50-325-40-30 MG PO CAPS: ORAL_CAPSULE | ORAL | 0 refills | Status: DC

## 2017-02-27 NOTE — Telephone Encounter (Signed)
Rx faxed to the pharmacy.

## 2017-03-02 ENCOUNTER — Other Ambulatory Visit: Payer: Self-pay | Admitting: Family Medicine

## 2017-03-04 DIAGNOSIS — G4733 Obstructive sleep apnea (adult) (pediatric): Secondary | ICD-10-CM | POA: Diagnosis not present

## 2017-03-11 ENCOUNTER — Encounter: Payer: Self-pay | Admitting: Family Medicine

## 2017-03-12 MED ORDER — MECLIZINE HCL 25 MG PO TABS: 25.0000 mg | ORAL_TABLET | Freq: Three times a day (TID) | ORAL | 0 refills | Status: DC | PRN

## 2017-03-24 ENCOUNTER — Ambulatory Visit
Admission: RE | Admit: 2017-03-24 | Discharge: 2017-03-24 | Disposition: A | Discharge status: Home / Self Care | Payer: BC Managed Care – PPO | Admitting: Internal Medicine

## 2017-03-24 ENCOUNTER — Ambulatory Visit
Admission: RE | Admit: 2017-03-24 | Discharge: 2017-03-24 | Disposition: A | Discharge status: Home / Self Care | Payer: BC Managed Care – PPO

## 2017-03-24 DIAGNOSIS — I2699 Other pulmonary embolism without acute cor pulmonale: Secondary | ICD-10-CM | POA: Diagnosis not present

## 2017-03-24 DIAGNOSIS — M797 Fibromyalgia: Secondary | ICD-10-CM | POA: Diagnosis not present

## 2017-03-24 DIAGNOSIS — R0902 Hypoxemia: Secondary | ICD-10-CM | POA: Diagnosis not present

## 2017-03-24 DIAGNOSIS — R918 Other nonspecific abnormal finding of lung field: Secondary | ICD-10-CM | POA: Diagnosis not present

## 2017-03-24 DIAGNOSIS — G4733 Obstructive sleep apnea (adult) (pediatric): Secondary | ICD-10-CM | POA: Diagnosis not present

## 2017-03-24 DIAGNOSIS — R0602 Shortness of breath: Secondary | ICD-10-CM | POA: Diagnosis not present

## 2017-03-24 DIAGNOSIS — Z6841 Body Mass Index (BMI) 40.0 and over, adult: Secondary | ICD-10-CM | POA: Diagnosis not present

## 2017-03-24 DIAGNOSIS — I82409 Acute embolism and thrombosis of unspecified deep veins of unspecified lower extremity: Secondary | ICD-10-CM | POA: Diagnosis not present

## 2017-03-24 DIAGNOSIS — Z79899 Other long term (current) drug therapy: Secondary | ICD-10-CM | POA: Diagnosis not present

## 2017-03-24 DIAGNOSIS — Z7952 Long term (current) use of systemic steroids: Secondary | ICD-10-CM | POA: Diagnosis not present

## 2017-03-24 DIAGNOSIS — I2510 Atherosclerotic heart disease of native coronary artery without angina pectoris: Secondary | ICD-10-CM | POA: Diagnosis not present

## 2017-03-24 DIAGNOSIS — J849 Interstitial pulmonary disease, unspecified: Secondary | ICD-10-CM | POA: Diagnosis not present

## 2017-03-24 DIAGNOSIS — Z9989 Dependence on other enabling machines and devices: Secondary | ICD-10-CM | POA: Diagnosis not present

## 2017-03-24 DIAGNOSIS — K224 Dyskinesia of esophagus: Secondary | ICD-10-CM | POA: Diagnosis not present

## 2017-03-24 DIAGNOSIS — F329 Major depressive disorder, single episode, unspecified: Secondary | ICD-10-CM | POA: Diagnosis not present

## 2017-03-24 DIAGNOSIS — Z7901 Long term (current) use of anticoagulants: Secondary | ICD-10-CM | POA: Diagnosis not present

## 2017-03-26 DIAGNOSIS — H2513 Age-related nuclear cataract, bilateral: Secondary | ICD-10-CM | POA: Diagnosis not present

## 2017-03-26 DIAGNOSIS — H04123 Dry eye syndrome of bilateral lacrimal glands: Secondary | ICD-10-CM | POA: Diagnosis not present

## 2017-03-28 ENCOUNTER — Encounter: Payer: Self-pay | Admitting: Family Medicine

## 2017-03-31 MED FILL — MYCOPHENOLATE MOFETIL/500MG/TABS: MYCOPHENOLATE MOFETIL/500MG/TABS | 30 days supply | Qty: 120 | Fill #3

## 2017-04-01 ENCOUNTER — Other Ambulatory Visit: Payer: Self-pay | Admitting: Family Medicine

## 2017-04-03 DIAGNOSIS — G4733 Obstructive sleep apnea (adult) (pediatric): Secondary | ICD-10-CM | POA: Diagnosis not present

## 2017-04-04 ENCOUNTER — Telehealth (HOSPITAL_COMMUNITY): Payer: Self-pay

## 2017-04-04 NOTE — Telephone Encounter (Signed)
Patient called in regards to Pulmonary Rehab referral from Dr. Farley Ly. Patient stated that she lost her husband unexpectedly and could not do the program at this time. She stated that she would call in 2-3 weeks to schedule. I will follow up with her if I don't hear from her.

## 2017-04-18 ENCOUNTER — Other Ambulatory Visit: Payer: Self-pay | Admitting: Family Medicine

## 2017-04-18 MED ORDER — CYCLOBENZAPRINE HCL 10 MG PO TABS: 10.0000 mg | ORAL_TABLET | Freq: Three times a day (TID) | ORAL | 0 refills | Status: DC | PRN

## 2017-04-24 ENCOUNTER — Telehealth (HOSPITAL_COMMUNITY): Payer: Self-pay

## 2017-04-24 MED ORDER — RIVAROXABAN 20 MG TABLET
ORAL_TABLET | Freq: Every day | ORAL | 2 refills | 0 days | Status: CP
Start: 2017-04-24 — End: 2017-07-21

## 2017-04-24 NOTE — Telephone Encounter (Signed)
LMTCB regarding Pulmonary Rehab referral.

## 2017-04-30 NOTE — Unmapped (Signed)
[facility] Refill and Clinical Coordination Note  Medication(s): Mycophenolate    Amber Bush, DOB: 19-Mar-1956  Phone: [phone] (home) , Alternate phone contact: N/A  Shipping address: [address]  [place] [address]  Phone or address changes today?: No  All above HIPAA information verified.  Insurance changes? No    Completed refill and clinical call assessment today to schedule patient's medication shipment from the [facility] [phone]).      MEDICATION RECONCILIATION    Confirmed the medication and dosage are correct and have not changed: Yes, regimen is correct and unchanged.    Were there any changes to your medication(s) in the past month:  No, there are no changes reported at this time.    ADHERENCE    Is this medicine transplant or covered by Medicare Part B? No.        Did you miss any doses in the past 4 weeks? No missed doses reported.  Adherence counseling provided? Not needed     SIDE EFFECT MANAGEMENT    Are you tolerating your medication?:  Amber Bush reports tolerating the medication.  Side effect management discussed: None      Therapy is appropriate and should be continued.          FINANCIAL/SHIPPING    Delivery Scheduled: Yes, Expected medication delivery date: 05/07/17   Additional medications refilled: No additional medications/refills needed at this time.    Amber Bush did not have any additional questions at this time.    Delivery address validated in [facility] scheduling system: Yes, address listed above is correct.      We will follow up with patient monthly for standard refill processing and delivery.      Thank you,  Rollen Sox   [facility] Specialty Pharmacist

## 2017-05-02 ENCOUNTER — Other Ambulatory Visit: Payer: Self-pay | Admitting: Family Medicine

## 2017-05-02 MED ORDER — BUTALBITAL-ASA-CAFF-CODEINE 50-325-40-30 MG PO CAPS: ORAL_CAPSULE | ORAL | 0 refills | Status: DC

## 2017-05-02 NOTE — Telephone Encounter (Signed)
Last OV 01/06/17 Fiorinal last filled 02/27/17 #30 with 0 Flexeril last filled 04/18/17 #90 with 0

## 2017-05-04 DIAGNOSIS — G4733 Obstructive sleep apnea (adult) (pediatric): Secondary | ICD-10-CM | POA: Diagnosis not present

## 2017-05-05 ENCOUNTER — Encounter: Payer: Self-pay | Admitting: Family Medicine

## 2017-05-05 ENCOUNTER — Other Ambulatory Visit: Payer: Self-pay | Admitting: General Practice

## 2017-05-05 MED ORDER — PROMETHAZINE HCL 25 MG PO TABS: 25.0000 mg | ORAL_TABLET | Freq: Three times a day (TID) | ORAL | 1 refills | Status: DC | PRN

## 2017-05-06 MED FILL — MYCOPHENOLATE MOFETIL/500MG/TABS: MYCOPHENOLATE MOFETIL/500MG/TABS | 30 days supply | Qty: 120 | Fill #4

## 2017-05-09 ENCOUNTER — Encounter (HOSPITAL_COMMUNITY)
Admission: RE | Admit: 2017-05-09 | Discharge: 2017-05-09 | Disposition: A | Discharge status: Home / Self Care | Payer: BLUE CROSS/BLUE SHIELD | Source: Ambulatory Visit | Attending: Pulmonary Disease | Admitting: Pulmonary Disease

## 2017-05-09 ENCOUNTER — Encounter (HOSPITAL_COMMUNITY): Payer: Self-pay

## 2017-05-09 VITALS — BP 137/79 | HR 86 | Resp 18 | Ht 64.5 in | Wt 244.7 lb

## 2017-05-09 DIAGNOSIS — J849 Interstitial pulmonary disease, unspecified: Secondary | ICD-10-CM | POA: Diagnosis not present

## 2017-05-09 NOTE — Progress Notes (Signed)
Stacey Alvarez 61 y.o. female Pulmonary Rehab Orientation Note Patient arrived today in Cardiac and Pulmonary Rehab for orientation to Pulmonary Rehab. She was transported from [employer] via wheel chair. He does not carry portable oxygen, however it has been prescribed in the past. She also has a home concentrator. Per pt, she uses oxygen infrequently. She does wear her CPAP nightly. Color good, skin warm and dry. Patient is oriented to time and place. Patient's medical history, psychosocial health, and medications reviewed. Psychosocial assessment reveals pt lives alone. She lost her husband several months ago after a cardiac arrest. She is still grieving the sudden loss. Pt is currently retired. Pt hobbies include spending time with friends. She has two children, one that is chronically sick with CF. This is a huge source of stress for her. He is also an alcoholic and suffers with drug addition. He is currently hospitalized for a pseudomonas infection.  Pt exhibits signs of depression. Signs of depression include crying spells, helplessness and sadness and difficulty maintaining sleep. PHQ2/9 score 3/15. She is currently seeing several counselors, attending [facility], and on medication for depression.  Pt shows fair  coping skills with positive outlook as it relates to her own disease process. She is offered emotional support and reassurance. Will continue to monitor and evaluate progress toward psychosocial goal(s) of managing depression with current interventions and recognizing when she needs additional help. Physical assessment reveals heart rate is normal, breath sounds clear to auscultation, no wheezes, rales, or rhonchi. Grip strength equal, strong. Distal pulses palpable. Patient reports she does take medications as prescribed. Patient states she follows a Regular diet. The patient reports no specific efforts to gain or lose weight.. Patient's weight will be monitored closely. Demonstration and practice  of PLB using pulse oximeter. Patient able to return demonstration satisfactorily. Safety and hand hygiene in the exercise area reviewed with patient. Patient voices understanding of the information reviewed. Department expectations discussed with patient and achievable goals were set. The patient shows enthusiasm about attending the program and we look forward to working with this nice lady. The patient is scheduled for a 6 min walk test on 8/28 and to begin exercise on 05/20/17.   45 minutes was spent on a variety of activities such as assessment of the patient, obtaining baseline data including height, weight, BMI, and grip strength, verifying medical history, allergies, and current medications, and teaching patient strategies for performing tasks with less respiratory effort with emphasis on pursed lip breathing.

## 2017-05-13 ENCOUNTER — Ambulatory Visit (HOSPITAL_COMMUNITY): Payer: Self-pay

## 2017-05-16 ENCOUNTER — Other Ambulatory Visit: Payer: Self-pay | Admitting: Family Medicine

## 2017-05-16 DIAGNOSIS — Z1231 Encounter for screening mammogram for malignant neoplasm of breast: Secondary | ICD-10-CM

## 2017-05-20 ENCOUNTER — Encounter (HOSPITAL_COMMUNITY): Payer: BLUE CROSS/BLUE SHIELD

## 2017-05-20 ENCOUNTER — Encounter (HOSPITAL_COMMUNITY)
Admission: RE | Admit: 2017-05-20 | Discharge: 2017-05-20 | Disposition: A | Discharge status: Home / Self Care | Payer: BLUE CROSS/BLUE SHIELD | Source: Ambulatory Visit | Attending: Pulmonary Disease | Admitting: Pulmonary Disease

## 2017-05-20 DIAGNOSIS — J849 Interstitial pulmonary disease, unspecified: Secondary | ICD-10-CM | POA: Insufficient documentation

## 2017-05-20 NOTE — Progress Notes (Signed)
Pulmonary Individual Treatment Plan  Patient Details  Name: Stacey Alvarez MRN: [id number] Date of Birth: 12-12-1955 Referring Provider:     Pulmonary Rehab Walk Test from 05/20/2017 in [place]  Referring Provider  Dr. Nelda Marseille      Initial Encounter Date:    Pulmonary Rehab Walk Test from 05/20/2017 in [place]  Date  05/20/17  Referring Provider  Dr. Nelda Marseille      Visit Diagnosis: ILD (interstitial lung disease) ([place])  Patient's Home Medications on Admission:   Current Outpatient Prescriptions:  .  ALPRAZolam (XANAX) 1 MG tablet, 0.5 mg in the am and 2mg  at night, Disp: , Rfl:  .  amLODipine (NORVASC) 5 MG tablet, Take 1 tablet (5 mg total) by mouth at bedtime., Disp: 90 tablet, Rfl: 1 .  azithromycin (ZITHROMAX) 250 MG tablet, TK 1 T PO QD, Disp: , Rfl: 6 .  butalbital-aspirin-caffeine-codeine (FIORINAL WITH CODEINE) 50-325-40-30 MG capsule, TAKE 1 CAPSULE BY MOUTH EVERY 6 HOURS AS NEEDED FOR PAIN, Disp: 30 capsule, Rfl: 0 .  cetirizine (ZYRTEC) 10 MG tablet, Take 10 mg by mouth daily., Disp: , Rfl:  .  cyclobenzaprine (FLEXERIL) 10 MG tablet, Take 1 tablet (10 mg total) by mouth 3 (three) times daily as needed for muscle spasms., Disp: 90 tablet, Rfl: 0 .  fenofibrate 160 MG tablet, Take 1 tablet (160 mg total) by mouth daily., Disp: 90 tablet, Rfl: 1 .  FLUoxetine (PROZAC) 20 MG tablet, Take 40 mg by mouth daily., Disp: , Rfl:  .  fluticasone (FLONASE) 50 MCG/ACT nasal spray, Place 2 sprays into both nostrils daily., Disp: 48 g, Rfl: 1 .  gabapentin (NEURONTIN) 600 MG tablet, TK 1 TO 2 TS PO QHS, Disp: , Rfl: 4 .  lamoTRIgine (LAMICTAL) 100 MG tablet, Take 100 mg by mouth daily., Disp: , Rfl:  .  lisinopril (PRINIVIL,ZESTRIL) 20 MG tablet, Take 1 tablet (20 mg total) by mouth 2 (two) times daily., Disp: 180 tablet, Rfl: 1 .  meclizine (ANTIVERT) 25 MG tablet, Take 1 tablet (25 mg total) by mouth 3 (three) times daily as  needed for dizziness. (Patient not taking: Reported on 05/09/2017), Disp: 45 tablet, Rfl: 0 .  mycophenolate (CELLCEPT) 500 MG tablet, Take 1,000 mg by mouth 2 (two) times daily. , Disp: , Rfl:  .  OXYGEN, Inhale 2 L into the lungs as needed (SOB)., Disp: , Rfl:  .  pantoprazole (PROTONIX) 40 MG tablet, TAKE ONE TABLET BY MOUTH DAILY, Disp: 30 tablet, Rfl: 6 .  pravastatin (PRAVACHOL) 20 MG tablet, TAKE 1 TABLET(20 MG) BY MOUTH DAILY, Disp: 90 tablet, Rfl: 0 .  predniSONE (DELTASONE) 10 MG tablet, Take 5 mg by mouth daily., Disp: , Rfl: 11 .  promethazine (PHENERGAN) 25 MG tablet, Take 1 tablet (25 mg total) by mouth every 8 (eight) hours as needed for nausea or vomiting. (Patient not taking: Reported on 05/09/2017), Disp: 45 tablet, Rfl: 1 .  rivaroxaban (XARELTO) 20 MG TABS tablet, Take 1 tablet (20 mg total) by mouth daily with supper., Disp: 30 tablet, Rfl: 5 .  temazepam (RESTORIL) 30 MG capsule, Take 30 mg by mouth at bedtime., Disp: , Rfl:   Past Medical History: Past Medical History:  Diagnosis Date  . Allergy   . Anxiety   . Arthritis    KNEES,BACK,HIPS  . Depression   . Fibromyalgia   . GERD (gastroesophageal reflux disease)   . Hyperlipidemia   . Hypertension   .  IBS (irritable bowel syndrome)   . Interstitial lung disease ([place])   . Sleep apnea     Tobacco Use: History  Smoking Status  . Former Smoker  Smokeless Tobacco  . Never Used    Comment: smoked 2 years in late teens about 8-10 cigs daily.     Labs: Recent Review Flowsheet Data    Labs for ITP Cardiac and Pulmonary Rehab Latest Ref Rng & Units 11/24/2015 06/12/2016 10/07/2016 10/07/2016 01/06/2017   Cholestrol 0 - 200 mg/dL 224(H) 241(H) - - 225(H)   LDLCALC 0 - 99 mg/dL - - - - 125(H)   LDLDIRECT mg/dL 133.0 144.0 - - -   HDL >39.00 mg/dL 48.80 52.50 - - 64.30   Trlycerides 0.0 - 149.0 mg/dL 249.0(H) 277.0(H) - - 178.0(H)   Hemoglobin A1c 4.6 - 6.5 % 5.2 - - - -   PHART 7.350 - 7.450 - - - 7.416 -   PCO2ART  32.0 - 48.0 mmHg - - - 38.9 -   HCO3 20.0 - 28.0 mmol/L - - 24.3 24.9 -   TCO2 0 - 100 mmol/L - - 25 26 -   O2SAT % - - 94.0 97.0 -      Capillary Blood Glucose: No results found for: GLUCAP   Pulmonary Assessment Scores:   Pulmonary Function Assessment:   Exercise Target Goals: Date: 05/20/17  Exercise Program Goal: Individual exercise prescription set with THRR, safety & activity barriers. Participant demonstrates ability to understand and report RPE using BORG scale, to self-measure pulse accurately, and to acknowledge the importance of the exercise prescription.  Exercise Prescription Goal: Starting with aerobic activity 30 plus minutes a day, 3 days per week for initial exercise prescription. Provide home exercise prescription and guidelines that participant acknowledges understanding prior to discharge.  Activity Barriers & Risk Stratification:     Activity Barriers & Cardiac Risk Stratification - 05/09/17 1044      Activity Barriers & Cardiac Risk Stratification   Activity Barriers Deconditioning;Shortness of Breath;Arthritis      6 Minute Walk:     6 Minute Walk    Row Name 05/20/17 1632         6 Minute Walk   Phase Initial     Distance 1610 feet     Walk Time 6 minutes     # of Rest Breaks 0     MPH 3.04     METS 3.3     RPE 13     Perceived Dyspnea  3     Symptoms No     Resting HR 102 bpm     Resting BP 135/84     Resting Oxygen Saturation  97 %     Exercise Oxygen Saturation  during 6 min walk 90 %     Max Ex. HR 120 bpm     Max Ex. BP 142/72       Interval HR   1 Minute HR 102     2 Minute HR 116     3 Minute HR 119     4 Minute HR 119     5 Minute HR 119     6 Minute HR 120     2 Minute Post HR 113     Interval Heart Rate? Yes       Interval Oxygen   Interval Oxygen? Yes     Baseline Oxygen Saturation % 97 %     1 Minute Oxygen Saturation % 96 %  1 Minute Liters of Oxygen 0 L     2 Minute Oxygen Saturation % 91 %     2  Minute Liters of Oxygen 0 L     3 Minute Oxygen Saturation % 91 %     3 Minute Liters of Oxygen 0 L     4 Minute Oxygen Saturation % 91 %     4 Minute Liters of Oxygen 0 L     5 Minute Oxygen Saturation % 90 %     5 Minute Liters of Oxygen 0 L     6 Minute Oxygen Saturation % 90 %     6 Minute Liters of Oxygen 0 L     2 Minute Post Oxygen Saturation % 92 %     2 Minute Post Liters of Oxygen 0 L        Oxygen Initial Assessment:     Oxygen Initial Assessment - 05/09/17 1041      Home Oxygen   Home Oxygen Device Home Concentrator;E-Tanks   Sleep Oxygen Prescription CPAP   Home Exercise Oxygen Prescription Continuous  oxygen prescribed for SOB prior to diagnosis of bilat PE. Patient now uses it intermittently when she feels she needs it. Will do 6 min walk and send results to MD to determine if patient able to discontinue.   Liters per minute 2   Home at Rest Exercise Oxygen Prescription None   Compliance with Home Oxygen Use Yes     Program Oxygen Prescription   Program Oxygen Prescription Continuous     Intervention   Short Term Goals To learn and exhibit compliance with exercise, home and travel O2 prescription;To learn and understand importance of monitoring SPO2 with pulse oximeter and demonstrate accurate use of the pulse oximeter.;To Learn and understand importance of maintaining oxygen saturations>88%;To learn and demonstrate proper purse lipped breathing techniques or other breathing techniques.   Long  Term Goals Verbalizes importance of monitoring SPO2 with pulse oximeter and return demonstration;Exhibits proper breathing techniques, such as purse lipped breathing or other method taught during program session;Exhibits compliance with exercise, home and travel O2 prescription;Maintenance of O2 saturations>88%      Oxygen Re-Evaluation:   Oxygen Discharge (Final Oxygen Re-Evaluation):   Initial Exercise Prescription:     Initial Exercise Prescription - 05/20/17  1600      Date of Initial Exercise RX and Referring Provider   Date 05/20/17   Referring Provider Dr. Nelda Marseille     Bike   Level 0.5   Minutes 17     NuStep   Level 2   Minutes 17     Track   Laps 10   Minutes 17     Prescription Details   Frequency (times per week) 2   Duration Progress to 45 minutes of aerobic exercise without signs/symptoms of physical distress     Intensity   THRR 40-80% of Max Heartrate 63-126   Ratings of Perceived Exertion 11-13   Perceived Dyspnea 0-4     Progression   Progression Continue progressive overload as per policy without signs/symptoms or physical distress.     Resistance Training   Training Prescription Yes   Weight blue bands   Reps 10-15      Perform Capillary Blood Glucose checks as needed.  Exercise Prescription Changes:   Exercise Comments:   Exercise Goals and Review:     Exercise Goals    Row Name 05/09/17 1045  Exercise Goals   Increase Physical Activity Yes       Intervention Provide advice, education, support and counseling about physical activity/exercise needs.;Develop an individualized exercise prescription for aerobic and resistive training based on initial evaluation findings, risk stratification, comorbidities and participant's personal goals.       Expected Outcomes Achievement of increased cardiorespiratory fitness and enhanced flexibility, muscular endurance and strength shown through measurements of functional capacity and personal statement of participant.       Increase Strength and Stamina Yes       Intervention Provide advice, education, support and counseling about physical activity/exercise needs.;Develop an individualized exercise prescription for aerobic and resistive training based on initial evaluation findings, risk stratification, comorbidities and participant's personal goals.       Expected Outcomes Achievement of increased cardiorespiratory fitness and enhanced flexibility,  muscular endurance and strength shown through measurements of functional capacity and personal statement of participant.          Exercise Goals Re-Evaluation :   Discharge Exercise Prescription (Final Exercise Prescription Changes):   Nutrition:  Target Goals: Understanding of nutrition guidelines, daily intake of sodium 1500mg , cholesterol 200mg , calories 30% from fat and 7% or less from saturated fats, daily to have 5 or more servings of fruits and vegetables.  Biometrics:    Nutrition Therapy Plan and Nutrition Goals:   Nutrition Discharge: Rate Your Plate Scores:   Nutrition Goals Re-Evaluation:   Nutrition Goals Discharge (Final Nutrition Goals Re-Evaluation):   Psychosocial: Target Goals: Acknowledge presence or absence of significant depression and/or stress, maximize coping skills, provide positive support system. Participant is able to verbalize types and ability to use techniques and skills needed for reducing stress and depression.  Initial Review & Psychosocial Screening:     Initial Psych Review & Screening - 05/09/17 1119      Initial Review   Current issues with Current Depression;History of Depression;Current Stress Concerns   Source of Stress Concerns Family   Comments son's illness and loss of husband unexpectedly     Family Dynamics   Good Support System? Yes   Concerns Recent loss of significant other     Barriers   Psychosocial barriers to participate in program There are no identifiable barriers or psychosocial needs.  although patient has significant psychosocial issues currently she does not feel they will cause barriers to her participation in pulmonary rehab     Screening Interventions   Interventions Encouraged to exercise;Provide feedback about the scores to participant      Quality of Life Scores:   PHQ-9: Recent Review Flowsheet Data    Depression screen [facility] 2/9 05/09/2017 01/06/2017 12/02/2016 11/24/2015 11/21/2014   Decreased  Interest 2 1 0  1 0   Down, Depressed, Hopeless 1 1 0 1 1   PHQ - 2 Score 3 2 0 2 1   Altered sleeping 1 0 0 2 -   Tired, decreased energy 2 0 0 2 -   Change in appetite 3 0 0 2 -   Feeling bad or failure about yourself  3 0 0 1 -   Trouble concentrating 3 0 0 1 -   Moving slowly or fidgety/restless 0 0 0 0 -   Suicidal thoughts 0 0 0 0 -   PHQ-9 Score 15 2 0 10 -   Difficult doing work/chores Somewhat difficult - - Not difficult at all -     Interpretation of Total Score  Total Score Depression Severity:  1-4 = Minimal depression, 5-9 =  Mild depression, 10-14 = Moderate depression, 15-19 = Moderately severe depression, 20-27 = Severe depression   Psychosocial Evaluation and Intervention:     Psychosocial Evaluation - 05/09/17 1123      Psychosocial Evaluation & Interventions   Interventions Encouraged to exercise with the program and follow exercise prescription   Expected Outcomes psychococial issues that patient currently dealing with will not become barriers to participation   Continue Psychosocial Services  Follow up required by staff      Psychosocial Re-Evaluation:   Psychosocial Discharge (Final Psychosocial Re-Evaluation):   Education: Education Goals: Education classes will be provided on a weekly basis, covering required topics. Participant will state understanding/return demonstration of topics presented.  Learning Barriers/Preferences:     Learning Barriers/Preferences - 05/09/17 1116      Learning Barriers/Preferences   Learning Barriers None   Learning Preferences Written Material      Education Topics: Risk Factor Reduction:  -Group instruction that is supported by a PowerPoint presentation. Instructor discusses the definition of a risk factor, different risk factors for pulmonary disease, and how the heart and lungs work together.     Nutrition for Pulmonary Patient:  -Group instruction provided by PowerPoint slides, verbal discussion, and  written materials to support subject matter. The instructor gives an explanation and review of healthy diet recommendations, which includes a discussion on weight management, recommendations for fruit and vegetable consumption, as well as protein, fluid, caffeine, fiber, sodium, sugar, and alcohol. Tips for eating when patients are short of breath are discussed.   Pursed Lip Breathing:  -Group instruction that is supported by demonstration and informational handouts. Instructor discusses the benefits of pursed lip and diaphragmatic breathing and detailed demonstration on how to preform both.     Oxygen Safety:  -Group instruction provided by PowerPoint, verbal discussion, and written material to support subject matter. There is an overview of "What is Oxygen" and "Why do we need it".  Instructor also reviews how to create a safe environment for oxygen use, the importance of using oxygen as prescribed, and the risks of noncompliance. There is a brief discussion on traveling with oxygen and resources the patient may utilize.   Oxygen Equipment:  -Group instruction provided by [facility] Staff utilizing handouts, written materials, and equipment demonstrations.   Signs and Symptoms:  -Group instruction provided by written material and verbal discussion to support subject matter. Warning signs and symptoms of infection, stroke, and heart attack are reviewed and when to call the physician/911 reinforced. Tips for preventing the spread of infection discussed.   Advanced Directives:  -Group instruction provided by verbal instruction and written material to support subject matter. Instructor reviews Advanced Directive laws and proper instruction for filling out document.   Pulmonary Video:  -Group video education that reviews the importance of medication and oxygen compliance, exercise, good nutrition, pulmonary hygiene, and pursed lip and diaphragmatic breathing for the pulmonary  patient.   Exercise for the Pulmonary Patient:  -Group instruction that is supported by a PowerPoint presentation. Instructor discusses benefits of exercise, core components of exercise, frequency, duration, and intensity of an exercise routine, importance of utilizing pulse oximetry during exercise, safety while exercising, and options of places to exercise outside of rehab.     Pulmonary Medications:  -Verbally interactive group education provided by instructor with focus on inhaled medications and proper administration.   Anatomy and Physiology of the Respiratory System and Intimacy:  -Group instruction provided by PowerPoint, verbal discussion, and written material to support subject matter.  Instructor reviews respiratory cycle and anatomical components of the respiratory system and their functions. Instructor also reviews differences in obstructive and restrictive respiratory diseases with examples of each. Intimacy, Sex, and Sexuality differences are reviewed with a discussion on how relationships can change when diagnosed with pulmonary disease. Common sexual concerns are reviewed.   MD DAY -A group question and answer session with a medical doctor that allows participants to ask questions that relate to their pulmonary disease state.   OTHER EDUCATION -Group or individual verbal, written, or video instructions that support the educational goals of the pulmonary rehab program.   Knowledge Questionnaire Score:   Core Components/Risk Factors/Patient Goals at Admission:     Personal Goals and Risk Factors at Admission - 05/09/17 1116      Core Components/Risk Factors/Patient Goals on Admission    Weight Management Yes;Obesity   Intervention Weight Management: Develop a combined nutrition and exercise program designed to reach desired caloric intake, while maintaining appropriate intake of nutrient and fiber, sodium and fats, and appropriate energy expenditure required for the  weight goal.;Weight Management/Obesity: Establish reasonable short term and long term weight goals.;Obesity: Provide education and appropriate resources to help participant work on and attain dietary goals.   Expected Outcomes Short Term: Continue to assess and modify interventions until short term weight is achieved;Long Term: Adherence to nutrition and physical activity/exercise program aimed toward attainment of established weight goal;Weight Loss: Understanding of general recommendations for a balanced deficit meal plan, which promotes 1-2 lb weight loss per week and includes a negative energy balance of [phone] kcal/d;Understanding recommendations for meals to include 15-35% energy as protein, 25-35% energy from fat, 35-60% energy from carbohydrates, less than 200mg  of dietary cholesterol, 20-35 gm of total fiber daily;Understanding of distribution of calorie intake throughout the day with the consumption of 4-5 meals/snacks   Improve shortness of breath with ADL's Yes   Intervention Provide education, individualized exercise plan and daily activity instruction to help decrease symptoms of SOB with activities of daily living.   Expected Outcomes Short Term: Achieves a reduction of symptoms when performing activities of daily living.   Develop more efficient breathing techniques such as purse lipped breathing and diaphragmatic breathing; and practicing self-pacing with activity Yes   Intervention Provide education, demonstration and support about specific breathing techniuqes utilized for more efficient breathing. Include techniques such as pursed lipped breathing, diaphragmatic breathing and self-pacing activity.   Expected Outcomes Short Term: Participant will be able to demonstrate and use breathing techniques as needed throughout daily activities.      Core Components/Risk Factors/Patient Goals Review:    Core Components/Risk Factors/Patient Goals at Discharge (Final Review):    ITP  Comments:   Comments:

## 2017-05-22 ENCOUNTER — Encounter (HOSPITAL_COMMUNITY): Payer: BLUE CROSS/BLUE SHIELD

## 2017-05-27 ENCOUNTER — Encounter (HOSPITAL_COMMUNITY)
Admission: RE | Admit: 2017-05-27 | Discharge: 2017-05-27 | Disposition: A | Discharge status: Home / Self Care | Payer: BLUE CROSS/BLUE SHIELD | Source: Ambulatory Visit | Attending: Pulmonary Disease | Admitting: Pulmonary Disease

## 2017-05-27 VITALS — Wt 250.0 lb

## 2017-05-27 DIAGNOSIS — J849 Interstitial pulmonary disease, unspecified: Secondary | ICD-10-CM

## 2017-05-27 NOTE — Progress Notes (Signed)
Daily Session Note  Patient Details  Name: Stacey Alvarez MRN: [id number] Date of Birth: 1956-08-01 Referring Provider:     Pulmonary Rehab Walk Test from 05/20/2017 in [place]  Referring Provider  Dr. Nelda Marseille      Encounter Date: 05/27/2017  Check In:     Session Check In - 05/27/17 1233      Check-In   Location [facility]   Staff Present Su Hilt, MS, ACSM RCEP, Exercise Physiologist;Maria Whitaker, RN, BSN;Joann Rion, RN, Luisa Hart, RN, BSN   Supervising physician immediately available to respond to emergencies [facility] immediately available   Physician(s) Dr. Bonner Puna   Medication changes reported     No   Fall or balance concerns reported    No   Tobacco Cessation No Change   Warm-up and Cool-down Performed as group-led instruction   Resistance Training Performed Yes   VAD Patient? No     Pain Assessment   Currently in Pain? No/denies   Multiple Pain Sites No      Capillary Blood Glucose: No results found for this or any previous visit (from the past 24 hour(s)).      Exercise Prescription Changes - 05/27/17 1200      Response to Exercise   Blood Pressure (Admit) 128/80   Blood Pressure (Exercise) 154/76   Blood Pressure (Exit) 122/70   Heart Rate (Admit) 85 bpm   Heart Rate (Exercise) 114 bpm   Heart Rate (Exit) 95 bpm   Oxygen Saturation (Admit) 98 %   Oxygen Saturation (Exercise) 91 %   Oxygen Saturation (Exit) 97 %   Rating of Perceived Exertion (Exercise) 13   Perceived Dyspnea (Exercise) 3   Duration Progress to 45 minutes of aerobic exercise without signs/symptoms of physical distress   Intensity THRR unchanged     Resistance Training   Training Prescription Yes   Weight blue bands   Reps 10-15     Bike   Level 2  scifit   Minutes 17     Track   Laps 16   Minutes 17      History  Smoking Status  . Former Smoker  Smokeless Tobacco  . Never Used    Comment:  smoked 2 years in late teens about 8-10 cigs daily.     Goals Met:  Exercise tolerated well No report of cardiac concerns or symptoms Strength training completed today  Goals Unmet:  Not Applicable  Comments: Service time is from 10:30a to 12:30p    Dr. Rush Farmer is Medical Director for Pulmonary Rehab at [facility].

## 2017-05-28 ENCOUNTER — Ambulatory Visit
Admission: RE | Admit: 2017-05-28 | Discharge: 2017-05-28 | Disposition: A | Discharge status: Home / Self Care | Payer: BLUE CROSS/BLUE SHIELD | Source: Ambulatory Visit | Attending: Family Medicine | Admitting: Family Medicine

## 2017-05-28 DIAGNOSIS — Z1231 Encounter for screening mammogram for malignant neoplasm of breast: Secondary | ICD-10-CM | POA: Diagnosis not present

## 2017-05-28 NOTE — Unmapped (Signed)
[facility] Refill Coordination Note  Specialty Medication(s): MYCOPHENOLATE 500 TAB      Amber Bush, DOB: January 12, 1956  Phone: [phone] (home) , Alternate phone contact: N/A  Phone or address changes today?: No  All above HIPAA information was verified with patient.  Shipping Address: [address]  [place] [address]   Insurance changes? No    Completed refill call assessment today to schedule patient's medication shipment from the [facility] [phone]).      Confirmed the medication and dosage are correct and have not changed: Yes, regimen is correct and unchanged.    Confirmed patient started or stopped the following medications in the past month:  No, there are no changes reported at this time.    Are you tolerating your medication?:  Amber Bush reports tolerating the medication.    ADHERENCE    (Below is required for Medicare Part B or Transplant patients only - per drug):   How many tablets were dispensed last month: 120  Patient currently has 9 DAYS  remaining.    Did you miss any doses in the past 4 weeks? No missed doses reported.    FINANCIAL/SHIPPING    Delivery Scheduled: Yes, Expected medication delivery date: 06/03/17     Amber Bush did not have any additional questions at this time.    Delivery address validated in [facility] scheduling system: Yes, address listed in [facility] is correct.    We will follow up with patient monthly for standard refill processing and delivery.      Thank you,  Westley Gambles   [facility] Specialty Technician

## 2017-05-29 ENCOUNTER — Encounter (HOSPITAL_COMMUNITY)
Admission: RE | Admit: 2017-05-29 | Discharge: 2017-05-29 | Disposition: A | Discharge status: Home / Self Care | Payer: BLUE CROSS/BLUE SHIELD | Source: Ambulatory Visit | Attending: Pulmonary Disease | Admitting: Pulmonary Disease

## 2017-05-29 VITALS — Wt 248.0 lb

## 2017-05-29 DIAGNOSIS — J849 Interstitial pulmonary disease, unspecified: Secondary | ICD-10-CM | POA: Diagnosis not present

## 2017-05-29 NOTE — Progress Notes (Signed)
Daily Session Note  Patient Details  Name: Stacey Alvarez MRN: [id number] Date of Birth: February 21, 1956 Referring Provider:     Pulmonary Rehab Walk Test from 05/20/2017 in [place]  Referring Provider  Dr. Nelda Marseille      Encounter Date: 05/29/2017  Check In:     Session Check In - 05/29/17 1203      Check-In   Location [facility]   Staff Present Su Hilt, MS, ACSM RCEP, Exercise Physiologist;Tahjay Binion Rollene Rotunda, RN, BSN   Supervising physician immediately available to respond to emergencies [facility] immediately available   Physician(s) Dr. Carolin Sicks   Medication changes reported     No   Fall or balance concerns reported    No   Tobacco Cessation No Change   Warm-up and Cool-down Performed as group-led instruction   Resistance Training Performed Yes   VAD Patient? No     Pain Assessment   Currently in Pain? No/denies   Multiple Pain Sites No      Capillary Blood Glucose: No results found for this or any previous visit (from the past 24 hour(s)).      Exercise Prescription Changes - 05/29/17 1202      Response to Exercise   Blood Pressure (Admit) 134/70   Blood Pressure (Exercise) 140/80   Blood Pressure (Exit) 134/60   Heart Rate (Admit) 88 bpm   Heart Rate (Exercise) 114 bpm   Heart Rate (Exit) 98 bpm   Oxygen Saturation (Admit) 97 %   Oxygen Saturation (Exercise) 91 %   Oxygen Saturation (Exit) 96 %   Rating of Perceived Exertion (Exercise) 12   Perceived Dyspnea (Exercise) 3   Duration Progress to 45 minutes of aerobic exercise without signs/symptoms of physical distress   Intensity THRR unchanged     Resistance Training   Training Prescription Yes   Weight blue bands   Reps 10-15     Bike   Level 3  scifit   Minutes 17     NuStep   Level 3   Minutes 1.9     Track   Laps 15   Minutes 17      History  Smoking Status  . Former Smoker  Smokeless Tobacco  . Never Used    Comment: smoked  2 years in late teens about 8-10 cigs daily.     Goals Met:  Improved SOB with ADL's Using PLB without cueing & demonstrates good technique Exercise tolerated well No report of cardiac concerns or symptoms Strength training completed today  Goals Unmet:  Not Applicable  Comments: Service time is from 1030 to 1200    Dr. Rush Farmer is Medical Director for Pulmonary Rehab at [facility].

## 2017-06-02 MED FILL — MYCOPHENOLATE MOFETIL/500MG/TABS: MYCOPHENOLATE MOFETIL/500MG/TABS | 30 days supply | Qty: 120 | Fill #5

## 2017-06-03 ENCOUNTER — Telehealth (HOSPITAL_COMMUNITY): Payer: Self-pay | Admitting: Family Medicine

## 2017-06-03 ENCOUNTER — Encounter (HOSPITAL_COMMUNITY): Payer: BLUE CROSS/BLUE SHIELD

## 2017-06-04 DIAGNOSIS — G4733 Obstructive sleep apnea (adult) (pediatric): Secondary | ICD-10-CM | POA: Diagnosis not present

## 2017-06-05 ENCOUNTER — Encounter (HOSPITAL_COMMUNITY)
Admission: RE | Admit: 2017-06-05 | Discharge: 2017-06-05 | Disposition: A | Discharge status: Home / Self Care | Payer: BLUE CROSS/BLUE SHIELD | Source: Ambulatory Visit | Attending: Pulmonary Disease | Admitting: Pulmonary Disease

## 2017-06-06 NOTE — Progress Notes (Signed)
Pulmonary Individual Treatment Plan  Patient Details  Name: Stacey Alvarez MRN: [id number] Date of Birth: 04-Dec-1955 Referring Provider:     Pulmonary Rehab Walk Test from 05/20/2017 in [facility]  Referring Provider  Dr. Nelda Marseille      Initial Encounter Date:    Pulmonary Rehab Walk Test from 05/20/2017 in [facility]  Date  05/20/17  Referring Provider  Dr. Nelda Marseille      Visit Diagnosis: ILD (interstitial lung disease) ([place])  Patient's Home Medications on Admission:   Current Outpatient Prescriptions:  .  ALPRAZolam (XANAX) 1 MG tablet, 0.5 mg in the am and 2mg  at night, Disp: , Rfl:  .  amLODipine (NORVASC) 5 MG tablet, Take 1 tablet (5 mg total) by mouth at bedtime., Disp: 90 tablet, Rfl: 1 .  azithromycin (ZITHROMAX) 250 MG tablet, TK 1 T PO QD, Disp: , Rfl: 6 .  butalbital-aspirin-caffeine-codeine (FIORINAL WITH CODEINE) 50-325-40-30 MG capsule, TAKE 1 CAPSULE BY MOUTH EVERY 6 HOURS AS NEEDED FOR PAIN, Disp: 30 capsule, Rfl: 0 .  cetirizine (ZYRTEC) 10 MG tablet, Take 10 mg by mouth daily., Disp: , Rfl:  .  cyclobenzaprine (FLEXERIL) 10 MG tablet, Take 1 tablet (10 mg total) by mouth 3 (three) times daily as needed for muscle spasms., Disp: 90 tablet, Rfl: 0 .  fenofibrate 160 MG tablet, Take 1 tablet (160 mg total) by mouth daily., Disp: 90 tablet, Rfl: 1 .  FLUoxetine (PROZAC) 20 MG tablet, Take 40 mg by mouth daily., Disp: , Rfl:  .  fluticasone (FLONASE) 50 MCG/ACT nasal spray, Place 2 sprays into both nostrils daily., Disp: 48 g, Rfl: 1 .  gabapentin (NEURONTIN) 600 MG tablet, TK 1 TO 2 TS PO QHS, Disp: , Rfl: 4 .  lamoTRIgine (LAMICTAL) 100 MG tablet, Take 100 mg by mouth daily., Disp: , Rfl:  .  lisinopril (PRINIVIL,ZESTRIL) 20 MG tablet, Take 1 tablet (20 mg total) by mouth 2 (two) times daily., Disp: 180 tablet, Rfl: 1 .  meclizine (ANTIVERT) 25 MG tablet, Take 1 tablet (25 mg total) by mouth 3 (three) times daily as  needed for dizziness. (Patient not taking: Reported on 05/09/2017), Disp: 45 tablet, Rfl: 0 .  mycophenolate (CELLCEPT) 500 MG tablet, Take 1,000 mg by mouth 2 (two) times daily. , Disp: , Rfl:  .  OXYGEN, Inhale 2 L into the lungs as needed (SOB)., Disp: , Rfl:  .  pantoprazole (PROTONIX) 40 MG tablet, TAKE ONE TABLET BY MOUTH DAILY, Disp: 30 tablet, Rfl: 6 .  pravastatin (PRAVACHOL) 20 MG tablet, TAKE 1 TABLET(20 MG) BY MOUTH DAILY, Disp: 90 tablet, Rfl: 0 .  predniSONE (DELTASONE) 10 MG tablet, Take 5 mg by mouth daily., Disp: , Rfl: 11 .  promethazine (PHENERGAN) 25 MG tablet, Take 1 tablet (25 mg total) by mouth every 8 (eight) hours as needed for nausea or vomiting. (Patient not taking: Reported on 05/09/2017), Disp: 45 tablet, Rfl: 1 .  rivaroxaban (XARELTO) 20 MG TABS tablet, Take 1 tablet (20 mg total) by mouth daily with supper., Disp: 30 tablet, Rfl: 5 .  temazepam (RESTORIL) 30 MG capsule, Take 30 mg by mouth at bedtime., Disp: , Rfl:   Past Medical History: Past Medical History:  Diagnosis Date  . Allergy   . Anxiety   . Arthritis    KNEES,BACK,HIPS  . Depression   . Fibromyalgia   . GERD (gastroesophageal reflux disease)   . Hyperlipidemia   . Hypertension   .  IBS (irritable bowel syndrome)   . Interstitial lung disease ([place])   . Sleep apnea     Tobacco Use: History  Smoking Status  . Former Smoker  Smokeless Tobacco  . Never Used    Comment: smoked 2 years in late teens about 8-10 cigs daily.     Labs: Recent Review Flowsheet Data    Labs for ITP Cardiac and Pulmonary Rehab Latest Ref Rng & Units 11/24/2015 06/12/2016 10/07/2016 10/07/2016 01/06/2017   Cholestrol 0 - 200 mg/dL 224(H) 241(H) - - 225(H)   LDLCALC 0 - 99 mg/dL - - - - 125(H)   LDLDIRECT mg/dL 133.0 144.0 - - -   HDL >39.00 mg/dL 48.80 52.50 - - 64.30   Trlycerides 0.0 - 149.0 mg/dL 249.0(H) 277.0(H) - - 178.0(H)   Hemoglobin A1c 4.6 - 6.5 % 5.2 - - - -   PHART 7.350 - 7.450 - - - 7.416 -   PCO2ART  32.0 - 48.0 mmHg - - - 38.9 -   HCO3 20.0 - 28.0 mmol/L - - 24.3 24.9 -   TCO2 0 - 100 mmol/L - - 25 26 -   O2SAT % - - 94.0 97.0 -      Capillary Blood Glucose: No results found for: GLUCAP   Pulmonary Assessment Scores:   Pulmonary Function Assessment:   Exercise Target Goals:    Exercise Program Goal: Individual exercise prescription set with THRR, safety & activity barriers. Participant demonstrates ability to understand and report RPE using BORG scale, to self-measure pulse accurately, and to acknowledge the importance of the exercise prescription.  Exercise Prescription Goal: Starting with aerobic activity 30 plus minutes a day, 3 days per week for initial exercise prescription. Provide home exercise prescription and guidelines that participant acknowledges understanding prior to discharge.  Activity Barriers & Risk Stratification:     Activity Barriers & Cardiac Risk Stratification - 05/09/17 1044      Activity Barriers & Cardiac Risk Stratification   Activity Barriers Deconditioning;Shortness of Breath;Arthritis      6 Minute Walk:     6 Minute Walk    Row Name 05/20/17 1632         6 Minute Walk   Phase Initial     Distance 1610 feet     Walk Time 6 minutes     # of Rest Breaks 0     MPH 3.04     METS 3.3     RPE 13     Perceived Dyspnea  3     Symptoms No     Resting HR 102 bpm     Resting BP 135/84     Resting Oxygen Saturation  97 %     Exercise Oxygen Saturation  during 6 min walk 90 %     Max Ex. HR 120 bpm     Max Ex. BP 142/72       Interval HR   1 Minute HR 102     2 Minute HR 116     3 Minute HR 119     4 Minute HR 119     5 Minute HR 119     6 Minute HR 120     2 Minute Post HR 113     Interval Heart Rate? Yes       Interval Oxygen   Interval Oxygen? Yes     Baseline Oxygen Saturation % 97 %     1 Minute Oxygen Saturation % 96 %  1 Minute Liters of Oxygen 0 L     2 Minute Oxygen Saturation % 91 %     2 Minute Liters of  Oxygen 0 L     3 Minute Oxygen Saturation % 91 %     3 Minute Liters of Oxygen 0 L     4 Minute Oxygen Saturation % 91 %     4 Minute Liters of Oxygen 0 L     5 Minute Oxygen Saturation % 90 %     5 Minute Liters of Oxygen 0 L     6 Minute Oxygen Saturation % 90 %     6 Minute Liters of Oxygen 0 L     2 Minute Post Oxygen Saturation % 92 %     2 Minute Post Liters of Oxygen 0 L        Oxygen Initial Assessment:     Oxygen Initial Assessment - 05/20/17 1640      Initial 6 min Walk   Oxygen Used None     Program Oxygen Prescription   Program Oxygen Prescription None      Oxygen Re-Evaluation:     Oxygen Re-Evaluation    Row Name 06/06/17 1204             Program Oxygen Prescription   Program Oxygen Prescription None         Home Oxygen   Home Oxygen Device Home Concentrator;E-Tanks       Sleep Oxygen Prescription CPAP       Home Exercise Oxygen Prescription None       Home at Rest Exercise Oxygen Prescription None         Goals/Expected Outcomes   Comments patient has been able to ween off oxygen since placed on it during hospitalization          Oxygen Discharge (Final Oxygen Re-Evaluation):     Oxygen Re-Evaluation - 06/06/17 1204      Program Oxygen Prescription   Program Oxygen Prescription None     Home Oxygen   Home Oxygen Device Home Concentrator;E-Tanks   Sleep Oxygen Prescription CPAP   Home Exercise Oxygen Prescription None   Home at Rest Exercise Oxygen Prescription None     Goals/Expected Outcomes   Comments patient has been able to ween off oxygen since placed on it during hospitalization      Initial Exercise Prescription:     Initial Exercise Prescription - 05/20/17 1600      Date of Initial Exercise RX and Referring Provider   Date 05/20/17   Referring Provider Dr. Nelda Marseille     Bike   Level 0.5   Minutes 17     NuStep   Level 2   Minutes 17     Track   Laps 10   Minutes 17     Prescription Details   Frequency  (times per week) 2   Duration Progress to 45 minutes of aerobic exercise without signs/symptoms of physical distress     Intensity   THRR 40-80% of Max Heartrate 63-126   Ratings of Perceived Exertion 11-13   Perceived Dyspnea 0-4     Progression   Progression Continue progressive overload as per policy without signs/symptoms or physical distress.     Resistance Training   Training Prescription Yes   Weight blue bands   Reps 10-15      Perform Capillary Blood Glucose checks as needed.  Exercise Prescription Changes:     Exercise Prescription Changes  [place] Name 05/27/17 1200 05/29/17 1202           Response to Exercise   Blood Pressure (Admit) 128/80 134/70      Blood Pressure (Exercise) 154/76 140/80      Blood Pressure (Exit) 122/70 134/60      Heart Rate (Admit) 85 bpm 88 bpm      Heart Rate (Exercise) 114 bpm 114 bpm      Heart Rate (Exit) 95 bpm 98 bpm      Oxygen Saturation (Admit) 98 % 97 %      Oxygen Saturation (Exercise) 91 % 91 %      Oxygen Saturation (Exit) 97 % 96 %      Rating of Perceived Exertion (Exercise) 13 12      Perceived Dyspnea (Exercise) 3 3      Duration Progress to 45 minutes of aerobic exercise without signs/symptoms of physical distress Progress to 45 minutes of aerobic exercise without signs/symptoms of physical distress      Intensity THRR unchanged THRR unchanged        Resistance Training   Training Prescription Yes Yes      Weight blue bands blue bands      Reps 10-15 10-15        Bike   Level 2  scifit 3  scifit      Minutes 17 17        NuStep   Level  - 3      Minutes  - 1.9        Track   Laps 16 15      Minutes 17 17         Exercise Comments:   Exercise Goals and Review:     Exercise Goals    Row Name 05/09/17 1045             Exercise Goals   Increase Physical Activity Yes       Intervention Provide advice, education, support and counseling about physical activity/exercise needs.;Develop an  individualized exercise prescription for aerobic and resistive training based on initial evaluation findings, risk stratification, comorbidities and participant's personal goals.       Expected Outcomes Achievement of increased cardiorespiratory fitness and enhanced flexibility, muscular endurance and strength shown through measurements of functional capacity and personal statement of participant.       Increase Strength and Stamina Yes       Intervention Provide advice, education, support and counseling about physical activity/exercise needs.;Develop an individualized exercise prescription for aerobic and resistive training based on initial evaluation findings, risk stratification, comorbidities and participant's personal goals.       Expected Outcomes Achievement of increased cardiorespiratory fitness and enhanced flexibility, muscular endurance and strength shown through measurements of functional capacity and personal statement of participant.          Exercise Goals Re-Evaluation :     Exercise Goals Re-Evaluation    [place] Name 06/02/17 1156             Exercise Goal Re-Evaluation   Exercise Goals Review Increase Strength and Stamina;Increase Physical Activity;Able to understand and use Dyspnea scale;Able to understand and use rate of perceived exertion (RPE) scale;Knowledge and understanding of Target Heart Rate Range (THRR);Understanding of Exercise Prescription       Comments Patient has only attended two exercise sessions. Will cont. to monitor and progress as able.        Expected Outcomes Through exercise and education at rehab and at  home, patient will increase strength and stamina. Patient will also gain a better understanding of the need for physical activity on a daily basis and the effects it can have on quality of life.          Discharge Exercise Prescription (Final Exercise Prescription Changes):     Exercise Prescription Changes - 05/29/17 1202      Response to  Exercise   Blood Pressure (Admit) 134/70   Blood Pressure (Exercise) 140/80   Blood Pressure (Exit) 134/60   Heart Rate (Admit) 88 bpm   Heart Rate (Exercise) 114 bpm   Heart Rate (Exit) 98 bpm   Oxygen Saturation (Admit) 97 %   Oxygen Saturation (Exercise) 91 %   Oxygen Saturation (Exit) 96 %   Rating of Perceived Exertion (Exercise) 12   Perceived Dyspnea (Exercise) 3   Duration Progress to 45 minutes of aerobic exercise without signs/symptoms of physical distress   Intensity THRR unchanged     Resistance Training   Training Prescription Yes   Weight blue bands   Reps 10-15     Bike   Level 3  scifit   Minutes 17     NuStep   Level 3   Minutes 1.9     Track   Laps 15   Minutes 17      Nutrition:  Target Goals: Understanding of nutrition guidelines, daily intake of sodium 1500mg , cholesterol 200mg , calories 30% from fat and 7% or less from saturated fats, daily to have 5 or more servings of fruits and vegetables.  Biometrics:    Nutrition Therapy Plan and Nutrition Goals:   Nutrition Discharge: Rate Your Plate Scores:   Nutrition Goals Re-Evaluation:   Nutrition Goals Discharge (Final Nutrition Goals Re-Evaluation):   Psychosocial: Target Goals: Acknowledge presence or absence of significant depression and/or stress, maximize coping skills, provide positive support system. Participant is able to verbalize types and ability to use techniques and skills needed for reducing stress and depression.  Initial Review & Psychosocial Screening:     Initial Psych Review & Screening - 05/09/17 1119      Initial Review   Current issues with Current Depression;History of Depression;Current Stress Concerns   Source of Stress Concerns Family   Comments son's illness and loss of husband unexpectedly     Family Dynamics   Good Support System? Yes   Concerns Recent loss of significant other     Barriers   Psychosocial barriers to participate in program There  are no identifiable barriers or psychosocial needs.  although patient has significant psychosocial issues currently she does not feel they will cause barriers to her participation in pulmonary rehab     Screening Interventions   Interventions Encouraged to exercise;Provide feedback about the scores to participant      Quality of Life Scores:   PHQ-9: Recent Review Flowsheet Data    Depression screen [facility] 2/9 05/09/2017 01/06/2017 12/02/2016 11/24/2015 11/21/2014   Decreased Interest 2 1 0  1 0   Down, Depressed, Hopeless 1 1 0 1 1   PHQ - 2 Score 3 2 0 2 1   Altered sleeping 1 0 0 2 -   Tired, decreased energy 2 0 0 2 -   Change in appetite 3 0 0 2 -   Feeling bad or failure about yourself  3 0 0 1 -   Trouble concentrating 3 0 0 1 -   Moving slowly or fidgety/restless 0 0 0 0 -   Suicidal thoughts  0 0 0 0 -   PHQ-9 Score 15 2 0 10 -   Difficult doing work/chores Somewhat difficult - - Not difficult at all -     Interpretation of Total Score  Total Score Depression Severity:  1-4 = Minimal depression, 5-9 = Mild depression, 10-14 = Moderate depression, 15-19 = Moderately severe depression, 20-27 = Severe depression   Psychosocial Evaluation and Intervention:     Psychosocial Evaluation - 05/09/17 1123      Psychosocial Evaluation & Interventions   Interventions Encouraged to exercise with the program and follow exercise prescription   Expected Outcomes psychococial issues that patient currently dealing with will not become barriers to participation   Continue Psychosocial Services  Follow up required by staff      Psychosocial Re-Evaluation:     Psychosocial Re-Evaluation    [place] Name 06/06/17 1206             Psychosocial Re-Evaluation   Current issues with Current Depression;History of Depression;Current Stress Concerns       Comments patient appears to enjoy pulmonary rehab. she smiles and interacts with her classmates.       Expected Outcomes patient will remain  free from psychosocial barriers to participation       Interventions -  currently in [place]  Follow up required by staff       Comments son's illness and loss of husband unexpectedly         Initial Review   Source of Stress Concerns Family          Psychosocial Discharge (Final Psychosocial Re-Evaluation):     Psychosocial Re-Evaluation - 06/06/17 1206      Psychosocial Re-Evaluation   Current issues with Current Depression;History of Depression;Current Stress Concerns   Comments patient appears to enjoy pulmonary rehab. she smiles and interacts with her classmates.   Expected Outcomes patient will remain free from psychosocial barriers to participation   Interventions --  currently in [place]  Follow up required by staff   Comments son's illness and loss of husband unexpectedly     Initial Review   Source of Stress Concerns Family      Education: Education Goals: Education classes will be provided on a weekly basis, covering required topics. Participant will state understanding/return demonstration of topics presented.  Learning Barriers/Preferences:     Learning Barriers/Preferences - 05/09/17 1116      Learning Barriers/Preferences   Learning Barriers None   Learning Preferences Written Material      Education Topics: Risk Factor Reduction:  -Group instruction that is supported by a PowerPoint presentation. Instructor discusses the definition of a risk factor, different risk factors for pulmonary disease, and how the heart and lungs work together.     Nutrition for Pulmonary Patient:  -Group instruction provided by PowerPoint slides, verbal discussion, and written materials to support subject matter. The instructor gives an explanation and review of healthy diet recommendations, which includes a discussion on weight management, recommendations for fruit and vegetable consumption, as well  as protein, fluid, caffeine, fiber, sodium, sugar, and alcohol. Tips for eating when patients are short of breath are discussed.   Pursed Lip Breathing:  -Group instruction that is supported by demonstration and informational handouts. Instructor discusses the benefits of pursed lip and diaphragmatic breathing and detailed demonstration on how to preform both.     Oxygen Safety:  -Group instruction provided by PowerPoint, verbal discussion,  and written material to support subject matter. There is an overview of "What is Oxygen" and "Why do we need it".  Instructor also reviews how to create a safe environment for oxygen use, the importance of using oxygen as prescribed, and the risks of noncompliance. There is a brief discussion on traveling with oxygen and resources the patient may utilize.   Oxygen Equipment:  -Group instruction provided by [facility] Staff utilizing handouts, written materials, and equipment demonstrations.   Signs and Symptoms:  -Group instruction provided by written material and verbal discussion to support subject matter. Warning signs and symptoms of infection, stroke, and heart attack are reviewed and when to call the physician/911 reinforced. Tips for preventing the spread of infection discussed.   Advanced Directives:  -Group instruction provided by verbal instruction and written material to support subject matter. Instructor reviews Advanced Directive laws and proper instruction for filling out document.   Pulmonary Video:  -Group video education that reviews the importance of medication and oxygen compliance, exercise, good nutrition, pulmonary hygiene, and pursed lip and diaphragmatic breathing for the pulmonary patient.   Exercise for the Pulmonary Patient:  -Group instruction that is supported by a PowerPoint presentation. Instructor discusses benefits of exercise, core components of exercise, frequency, duration, and intensity of an exercise routine,  importance of utilizing pulse oximetry during exercise, safety while exercising, and options of places to exercise outside of rehab.     PULMONARY REHAB OTHER RESPIRATORY from 05/27/2017 in [place]  Date  05/27/17  Educator  ep  Instruction Review Code  2- meets goals/outcomes      Pulmonary Medications:  -Verbally interactive group education provided by instructor with focus on inhaled medications and proper administration.   Anatomy and Physiology of the Respiratory System and Intimacy:  -Group instruction provided by PowerPoint, verbal discussion, and written material to support subject matter. Instructor reviews respiratory cycle and anatomical components of the respiratory system and their functions. Instructor also reviews differences in obstructive and restrictive respiratory diseases with examples of each. Intimacy, Sex, and Sexuality differences are reviewed with a discussion on how relationships can change when diagnosed with pulmonary disease. Common sexual concerns are reviewed.   MD DAY -A group question and answer session with a medical doctor that allows participants to ask questions that relate to their pulmonary disease state.   OTHER EDUCATION -Group or individual verbal, written, or video instructions that support the educational goals of the pulmonary rehab program.   Knowledge Questionnaire Score:   Core Components/Risk Factors/Patient Goals at Admission:     Personal Goals and Risk Factors at Admission - 05/09/17 1116      Core Components/Risk Factors/Patient Goals on Admission    Weight Management Yes;Obesity   Intervention Weight Management: Develop a combined nutrition and exercise program designed to reach desired caloric intake, while maintaining appropriate intake of nutrient and fiber, sodium and fats, and appropriate energy expenditure required for the weight goal.;Weight Management/Obesity: Establish reasonable short  term and long term weight goals.;Obesity: Provide education and appropriate resources to help participant work on and attain dietary goals.   Expected Outcomes Short Term: Continue to assess and modify interventions until short term weight is achieved;Long Term: Adherence to nutrition and physical activity/exercise program aimed toward attainment of established weight goal;Weight Loss: Understanding of general recommendations for a balanced deficit meal plan, which promotes 1-2 lb weight loss per week and includes a negative energy balance of [phone] kcal/d;Understanding recommendations for meals to include 15-35%  energy as protein, 25-35% energy from fat, 35-60% energy from carbohydrates, less than 200mg  of dietary cholesterol, 20-35 gm of total fiber daily;Understanding of distribution of calorie intake throughout the day with the consumption of 4-5 meals/snacks   Improve shortness of breath with ADL's Yes   Intervention Provide education, individualized exercise plan and daily activity instruction to help decrease symptoms of SOB with activities of daily living.   Expected Outcomes Short Term: Achieves a reduction of symptoms when performing activities of daily living.   Develop more efficient breathing techniques such as purse lipped breathing and diaphragmatic breathing; and practicing self-pacing with activity Yes   Intervention Provide education, demonstration and support about specific breathing techniuqes utilized for more efficient breathing. Include techniques such as pursed lipped breathing, diaphragmatic breathing and self-pacing activity.   Expected Outcomes Short Term: Participant will be able to demonstrate and use breathing techniques as needed throughout daily activities.      Core Components/Risk Factors/Patient Goals Review:      Goals and Risk Factor Review    Row Name 06/06/17 1205             Core Components/Risk Factors/Patient Goals Review   Personal Goals Review  Weight Management/Obesity;Improve shortness of breath with ADL's;Develop more efficient breathing techniques such as purse lipped breathing and diaphragmatic breathing and practicing self-pacing with activity.       Review patient has attended only 2 sessions since admission and too early to evaluate progress towards goals       Expected Outcomes see admission outcomes          Core Components/Risk Factors/Patient Goals at Discharge (Final Review):      Goals and Risk Factor Review - 06/06/17 1205      Core Components/Risk Factors/Patient Goals Review   Personal Goals Review Weight Management/Obesity;Improve shortness of breath with ADL's;Develop more efficient breathing techniques such as purse lipped breathing and diaphragmatic breathing and practicing self-pacing with activity.   Review patient has attended only 2 sessions since admission and too early to evaluate progress towards goals   Expected Outcomes see admission outcomes      ITP Comments:   Comments: ITP REVIEW  Recommend continued exercise, life style modification, education, and utilization of breathing techniques to increase stamina and strength and decrease shortness of breath with exertion.

## 2017-06-10 ENCOUNTER — Encounter (HOSPITAL_COMMUNITY)
Admission: RE | Admit: 2017-06-10 | Discharge: 2017-06-10 | Disposition: A | Discharge status: Home / Self Care | Payer: BLUE CROSS/BLUE SHIELD | Source: Ambulatory Visit | Attending: Pulmonary Disease | Admitting: Pulmonary Disease

## 2017-06-10 ENCOUNTER — Other Ambulatory Visit: Payer: Self-pay | Admitting: Family Medicine

## 2017-06-10 ENCOUNTER — Telehealth (HOSPITAL_COMMUNITY): Payer: Self-pay | Admitting: Family Medicine

## 2017-06-10 DIAGNOSIS — J849 Interstitial pulmonary disease, unspecified: Secondary | ICD-10-CM

## 2017-06-12 ENCOUNTER — Encounter (HOSPITAL_COMMUNITY): Payer: BLUE CROSS/BLUE SHIELD

## 2017-06-15 ENCOUNTER — Other Ambulatory Visit: Payer: Self-pay | Admitting: Family Medicine

## 2017-06-17 ENCOUNTER — Encounter (HOSPITAL_COMMUNITY)
Admission: RE | Admit: 2017-06-17 | Discharge: 2017-06-17 | Disposition: A | Discharge status: Home / Self Care | Payer: BLUE CROSS/BLUE SHIELD | Source: Ambulatory Visit | Attending: Pulmonary Disease | Admitting: Pulmonary Disease

## 2017-06-17 VITALS — Wt 249.1 lb

## 2017-06-17 DIAGNOSIS — J849 Interstitial pulmonary disease, unspecified: Secondary | ICD-10-CM | POA: Insufficient documentation

## 2017-06-17 NOTE — Progress Notes (Signed)
Daily Session Note  Patient Details  Name: Stacey Alvarez MRN: [id number] Date of Birth: 03-Jan-1956 Referring Provider:     Pulmonary Rehab Walk Test from 05/20/2017 in [facility]  Referring Provider  Dr. Nelda Marseille      Encounter Date: 06/17/2017  Check In:     Session Check In - 06/17/17 1247      Check-In   Location [facility]   Staff Present Carols Clemence, MS, ACSM RCEP, Exercise Physiologist      Capillary Blood Glucose: No results found for this or any previous visit (from the past 24 hour(s)).      Exercise Prescription Changes - 06/17/17 1200      Response to Exercise   Blood Pressure (Admit) 132/62   Blood Pressure (Exercise) 140/70   Blood Pressure (Exit) 122/68   Heart Rate (Admit) 90 bpm   Heart Rate (Exercise) 114 bpm   Heart Rate (Exit) 92 bpm   Oxygen Saturation (Admit) 99 %   Oxygen Saturation (Exercise) 89 %   Oxygen Saturation (Exit) 97 %   Rating of Perceived Exertion (Exercise) 13   Perceived Dyspnea (Exercise) 2   Duration Progress to 45 minutes of aerobic exercise without signs/symptoms of physical distress   Intensity THRR unchanged     Resistance Training   Training Prescription Yes   Weight blue bands   Reps 10-15     Bike   Level 3  scifit   Minutes 17     NuStep   Level 3   Minutes 1.8     Track   Laps 16   Minutes 17      History  Smoking Status  . Former Smoker  Smokeless Tobacco  . Never Used    Comment: smoked 2 years in late teens about 8-10 cigs daily.     Goals Met:  Exercise tolerated well No report of cardiac concerns or symptoms Strength training completed today  Goals Unmet:  Not Applicable  Comments: Service time is from 10:30a to 12:10p    Dr. Rush Farmer is Medical Director for Pulmonary Rehab at [facility].

## 2017-06-19 ENCOUNTER — Encounter (HOSPITAL_COMMUNITY): Payer: BLUE CROSS/BLUE SHIELD

## 2017-06-20 ENCOUNTER — Other Ambulatory Visit: Payer: Self-pay | Admitting: Family Medicine

## 2017-06-23 ENCOUNTER — Encounter: Payer: Self-pay | Admitting: Family Medicine

## 2017-06-23 ENCOUNTER — Other Ambulatory Visit: Payer: Self-pay | Admitting: Family Medicine

## 2017-06-23 MED ORDER — CYCLOBENZAPRINE HCL 10 MG PO TABS: 10.0000 mg | ORAL_TABLET | Freq: Three times a day (TID) | ORAL | 0 refills | Status: DC | PRN

## 2017-06-23 MED ORDER — BUTALBITAL-ASA-CAFF-CODEINE 50-325-40-30 MG PO CAPS: ORAL_CAPSULE | ORAL | 0 refills | Status: DC

## 2017-06-23 NOTE — Telephone Encounter (Signed)
Medication filled to pharmacy as requested.   

## 2017-06-23 NOTE — Telephone Encounter (Signed)
Last OV 01/06/17 [facility] last filled 05/02/17 #30 with 0

## 2017-06-24 ENCOUNTER — Encounter (HOSPITAL_COMMUNITY): Payer: BLUE CROSS/BLUE SHIELD

## 2017-06-24 NOTE — Unmapped (Signed)
[facility] Refill and Clinical Coordination Note  Medication(s): MYCOPHENOLATE 500MG     Amber Bush, DOB: 1955-11-24  Phone: [phone] (home) , Alternate phone contact: N/A  Shipping address: [address]  [place] [address]  Phone or address changes today?: No  All above HIPAA information verified.  Insurance changes? No    Completed refill and clinical call assessment today to schedule patient's medication shipment from the [facility] [phone]).      MEDICATION RECONCILIATION    Confirmed the medication and dosage are correct and have not changed: Yes, regimen is correct and unchanged.    Were there any changes to your medication(s) in the past month:  prednisone dose has been decreased recently by Dr Dudley Major.    ADHERENCE    Mycophenolate Mofetil 500 mg   Quantity filled last month: 120   # of tablets left on hand: 36      Did you miss any doses in the past 4 weeks? No missed doses reported.  Adherence counseling provided? Not needed     SIDE EFFECT MANAGEMENT    Are you tolerating your medication?:  Amber Bush reports tolerating the medication.  Side effect management discussed: None      Therapy is appropriate and should be continued.    Evidence of clinical benefit: See Epic note from 03/24/2017      FINANCIAL/SHIPPING    Delivery Scheduled: Yes, Expected medication delivery date: 07/01/2017   Additional medications refilled: No additional medications/refills needed at this time.    Amber Bush did not have any additional questions at this time.    Delivery address validated in [facility] scheduling system: Yes, address listed above is correct.      We will follow up with patient monthly for standard refill processing and delivery.      Thank you,  Thad Ranger   [facility] Specialty Pharmacist

## 2017-06-25 ENCOUNTER — Other Ambulatory Visit: Payer: Self-pay | Admitting: Family Medicine

## 2017-06-26 ENCOUNTER — Encounter (HOSPITAL_COMMUNITY): Admission: RE | Admit: 2017-06-26 | Payer: BLUE CROSS/BLUE SHIELD | Source: Ambulatory Visit

## 2017-06-29 ENCOUNTER — Other Ambulatory Visit: Payer: Self-pay | Admitting: Family Medicine

## 2017-06-29 MED FILL — MYCOPHENOLATE MOFETIL/500MG/TABS: MYCOPHENOLATE MOFETIL/500MG/TABS | 30 days supply | Qty: 120 | Fill #6

## 2017-07-01 ENCOUNTER — Encounter (HOSPITAL_COMMUNITY)
Admission: RE | Admit: 2017-07-01 | Discharge: 2017-07-01 | Disposition: A | Discharge status: Home / Self Care | Payer: BLUE CROSS/BLUE SHIELD | Source: Ambulatory Visit | Attending: Pulmonary Disease | Admitting: Pulmonary Disease

## 2017-07-01 VITALS — Wt 249.8 lb

## 2017-07-01 DIAGNOSIS — F3342 Major depressive disorder, recurrent, in full remission: Secondary | ICD-10-CM | POA: Diagnosis not present

## 2017-07-01 DIAGNOSIS — J849 Interstitial pulmonary disease, unspecified: Secondary | ICD-10-CM | POA: Diagnosis not present

## 2017-07-01 DIAGNOSIS — F4321 Adjustment disorder with depressed mood: Secondary | ICD-10-CM | POA: Diagnosis not present

## 2017-07-01 NOTE — Progress Notes (Signed)
Daily Session Note  Patient Details  Name: Stacey Alvarez MRN: [id number] Date of Birth: [birth year] Referring Provider:     Pulmonary Rehab Walk Test from 05/20/2017 in [place]  Referring Provider  Dr. Nelda Marseille      Encounter Date: 07/01/2017  Check In:     Session Check In - 07/01/17 1222      Check-In   Location [facility]   Staff Present Levester Waldridge, MS, ACSM RCEP, Exercise Physiologist;Lisa Ysidro Evert, RN      Capillary Blood Glucose: No results found for this or any previous visit (from the past 24 hour(s)).      Exercise Prescription Changes - 07/01/17 1200      Response to Exercise   Blood Pressure (Admit) 120/68   Blood Pressure (Exercise) 124/60   Blood Pressure (Exit) 118/62   Heart Rate (Admit) 96 bpm   Heart Rate (Exercise) 115 bpm   Heart Rate (Exit) 93 bpm   Oxygen Saturation (Admit) 96 %   Oxygen Saturation (Exercise) 95 %   Oxygen Saturation (Exit) 97 %   Rating of Perceived Exertion (Exercise) 15   Perceived Dyspnea (Exercise) 3   Duration Progress to 45 minutes of aerobic exercise without signs/symptoms of physical distress   Intensity THRR unchanged     Resistance Training   Training Prescription Yes   Weight blue bands   Reps 10-15     Bike   Level 4  scitfit   Minutes 17     NuStep   Level 4   Minutes 1.7     Track   Laps 6   Minutes 17      History  Smoking Status  . Former Smoker  Smokeless Tobacco  . Never Used    Comment: smoked 2 years in late teens about 8-10 cigs daily.     Goals Met:  Exercise tolerated well No report of cardiac concerns or symptoms Strength training completed today  Goals Unmet:  Not Applicable  Comments: Service time is from 10:30a to 12:10p    Dr. Rush Farmer is Medical Director for Pulmonary Rehab at [facility].

## 2017-07-03 ENCOUNTER — Encounter (HOSPITAL_COMMUNITY): Payer: BLUE CROSS/BLUE SHIELD

## 2017-07-04 DIAGNOSIS — G4733 Obstructive sleep apnea (adult) (pediatric): Secondary | ICD-10-CM | POA: Diagnosis not present

## 2017-07-07 ENCOUNTER — Ambulatory Visit: Payer: Self-pay | Admitting: Family Medicine

## 2017-07-07 NOTE — Progress Notes (Signed)
Pulmonary Individual Treatment Plan  Patient Details  Name: Stacey Alvarez MRN: [id number] Date of Birth: [birth year] Referring Provider:     Pulmonary Rehab Walk Test from 05/20/2017 in [facility]  Referring Provider  Dr. Nelda Marseille      Initial Encounter Date:    Pulmonary Rehab Walk Test from 05/20/2017 in [facility]  Date  05/20/17  Referring Provider  Dr. Nelda Marseille      Visit Diagnosis: ILD (interstitial lung disease) ([place])  Patient's Home Medications on Admission:   Current Outpatient Prescriptions:  .  ALPRAZolam (XANAX) 1 MG tablet, 0.5 mg in the am and 2mg  at night, Disp: , Rfl:  .  amLODipine (NORVASC) 5 MG tablet, TAKE 1 TABLET(5 MG) BY MOUTH AT BEDTIME, Disp: 90 tablet, Rfl: 0 .  azithromycin (ZITHROMAX) 250 MG tablet, TK 1 T PO QD, Disp: , Rfl: 6 .  butalbital-aspirin-caffeine-codeine (FIORINAL WITH CODEINE) 50-325-40-30 MG capsule, TAKE 1 CAPSULE BY MOUTH EVERY 6 HOURS AS NEEDED FOR PAIN, Disp: 10 capsule, Rfl: 0 .  cetirizine (ZYRTEC) 10 MG tablet, Take 10 mg by mouth daily., Disp: , Rfl:  .  cyclobenzaprine (FLEXERIL) 10 MG tablet, Take 1 tablet (10 mg total) by mouth 3 (three) times daily as needed for muscle spasms., Disp: 90 tablet, Rfl: 0 .  fenofibrate 160 MG tablet, TAKE 1 TABLET(160 MG) BY MOUTH DAILY, Disp: 90 tablet, Rfl: 0 .  FLUoxetine (PROZAC) 20 MG tablet, Take 40 mg by mouth daily., Disp: , Rfl:  .  fluticasone (FLONASE) 50 MCG/ACT nasal spray, SHAKE LIQUID AND USE 2 SPRAYS IN EACH NOSTRIL DAILY, Disp: 16 g, Rfl: 3 .  gabapentin (NEURONTIN) 600 MG tablet, TK 1 TO 2 TS PO QHS, Disp: , Rfl: 4 .  lamoTRIgine (LAMICTAL) 100 MG tablet, Take 100 mg by mouth daily., Disp: , Rfl:  .  lisinopril (PRINIVIL,ZESTRIL) 20 MG tablet, TAKE 1 TABLET(20 MG) BY MOUTH TWICE DAILY, Disp: 180 tablet, Rfl: 0 .  meclizine (ANTIVERT) 25 MG tablet, Take 1 tablet (25 mg total) by mouth 3 (three) times daily as needed for  dizziness. (Patient not taking: Reported on 05/09/2017), Disp: 45 tablet, Rfl: 0 .  mycophenolate (CELLCEPT) 500 MG tablet, Take 1,000 mg by mouth 2 (two) times daily. , Disp: , Rfl:  .  OXYGEN, Inhale 2 L into the lungs as needed (SOB)., Disp: , Rfl:  .  pantoprazole (PROTONIX) 40 MG tablet, TAKE ONE TABLET BY MOUTH DAILY, Disp: 30 tablet, Rfl: 6 .  pravastatin (PRAVACHOL) 20 MG tablet, TAKE 1 TABLET(20 MG) BY MOUTH DAILY, Disp: 90 tablet, Rfl: 0 .  predniSONE (DELTASONE) 10 MG tablet, Take 5 mg by mouth daily., Disp: , Rfl: 11 .  promethazine (PHENERGAN) 25 MG tablet, Take 1 tablet (25 mg total) by mouth every 8 (eight) hours as needed for nausea or vomiting. (Patient not taking: Reported on 05/09/2017), Disp: 45 tablet, Rfl: 1 .  rivaroxaban (XARELTO) 20 MG TABS tablet, Take 1 tablet (20 mg total) by mouth daily with supper., Disp: 30 tablet, Rfl: 5 .  temazepam (RESTORIL) 30 MG capsule, Take 30 mg by mouth at bedtime., Disp: , Rfl:   Past Medical History: Past Medical History:  Diagnosis Date  . Allergy   . Anxiety   . Arthritis    KNEES,BACK,HIPS  . Depression   . Fibromyalgia   . GERD (gastroesophageal reflux disease)   . Hyperlipidemia   . Hypertension   . IBS (irritable bowel syndrome)   .  Interstitial lung disease ([place])   . Sleep apnea     Tobacco Use: History  Smoking Status  . Former Smoker  Smokeless Tobacco  . Never Used    Comment: smoked 2 years in late teens about 8-10 cigs daily.     Labs: Recent Review Flowsheet Data    Labs for ITP Cardiac and Pulmonary Rehab Latest Ref Rng & Units 11/24/2015 06/12/2016 10/07/2016 10/07/2016 01/06/2017   Cholestrol 0 - 200 mg/dL 224(H) 241(H) - - 225(H)   LDLCALC 0 - 99 mg/dL - - - - 125(H)   LDLDIRECT mg/dL 133.0 144.0 - - -   HDL >39.00 mg/dL 48.80 52.50 - - 64.30   Trlycerides 0.0 - 149.0 mg/dL 249.0(H) 277.0(H) - - 178.0(H)   Hemoglobin A1c 4.6 - 6.5 % 5.2 - - - -   PHART 7.350 - 7.450 - - - 7.416 -   PCO2ART 32.0 - 48.0  mmHg - - - 38.9 -   HCO3 20.0 - 28.0 mmol/L - - 24.3 24.9 -   TCO2 0 - 100 mmol/L - - 25 26 -   O2SAT % - - 94.0 97.0 -      Capillary Blood Glucose: No results found for: GLUCAP   Pulmonary Assessment Scores:   Pulmonary Function Assessment:   Exercise Target Goals:    Exercise Program Goal: Individual exercise prescription set with THRR, safety & activity barriers. Participant demonstrates ability to understand and report RPE using BORG scale, to self-measure pulse accurately, and to acknowledge the importance of the exercise prescription.  Exercise Prescription Goal: Starting with aerobic activity 30 plus minutes a day, 3 days per week for initial exercise prescription. Provide home exercise prescription and guidelines that participant acknowledges understanding prior to discharge.  Activity Barriers & Risk Stratification:     Activity Barriers & Cardiac Risk Stratification - 05/09/17 1044      Activity Barriers & Cardiac Risk Stratification   Activity Barriers Deconditioning;Shortness of Breath;Arthritis      6 Minute Walk:     6 Minute Walk    Row Name 05/20/17 1632         6 Minute Walk   Phase Initial     Distance 1610 feet     Walk Time 6 minutes     # of Rest Breaks 0     MPH 3.04     METS 3.3     RPE 13     Perceived Dyspnea  3     Symptoms No     Resting HR 102 bpm     Resting BP 135/84     Resting Oxygen Saturation  97 %     Exercise Oxygen Saturation  during 6 min walk 90 %     Max Ex. HR 120 bpm     Max Ex. BP 142/72       Interval HR   1 Minute HR 102     2 Minute HR 116     3 Minute HR 119     4 Minute HR 119     5 Minute HR 119     6 Minute HR 120     2 Minute Post HR 113     Interval Heart Rate? Yes       Interval Oxygen   Interval Oxygen? Yes     Baseline Oxygen Saturation % 97 %     1 Minute Oxygen Saturation % 96 %     1 Minute Liters of Oxygen  0 L     2 Minute Oxygen Saturation % 91 %     2 Minute Liters of Oxygen 0 L      3 Minute Oxygen Saturation % 91 %     3 Minute Liters of Oxygen 0 L     4 Minute Oxygen Saturation % 91 %     4 Minute Liters of Oxygen 0 L     5 Minute Oxygen Saturation % 90 %     5 Minute Liters of Oxygen 0 L     6 Minute Oxygen Saturation % 90 %     6 Minute Liters of Oxygen 0 L     2 Minute Post Oxygen Saturation % 92 %     2 Minute Post Liters of Oxygen 0 L        Oxygen Initial Assessment:     Oxygen Initial Assessment - 05/20/17 1640      Initial 6 min Walk   Oxygen Used None     Program Oxygen Prescription   Program Oxygen Prescription None      Oxygen Re-Evaluation:     Oxygen Re-Evaluation    Row Name 06/06/17 1204 07/06/17 2153           Program Oxygen Prescription   Program Oxygen Prescription None None        Home Oxygen   Home Oxygen Device Home Concentrator;E-Tanks Home Concentrator;E-Tanks      Sleep Oxygen Prescription CPAP CPAP      Home Exercise Oxygen Prescription None None      Home at Rest Exercise Oxygen Prescription None None        Goals/Expected Outcomes   Comments patient has been able to ween off oxygen since placed on it during hospitalization patient continue to remain off oxygen during pulmonary rehab and saturation are staying in the 90s with maximum exertion      Goals/Expected Outcomes  - see admission outcomes         Oxygen Discharge (Final Oxygen Re-Evaluation):     Oxygen Re-Evaluation - 07/06/17 2153      Program Oxygen Prescription   Program Oxygen Prescription None     Home Oxygen   Home Oxygen Device Home Concentrator;E-Tanks   Sleep Oxygen Prescription CPAP   Home Exercise Oxygen Prescription None   Home at Rest Exercise Oxygen Prescription None     Goals/Expected Outcomes   Comments patient continue to remain off oxygen during pulmonary rehab and saturation are staying in the 90s with maximum exertion   Goals/Expected Outcomes see admission outcomes      Initial Exercise Prescription:     Initial  Exercise Prescription - 05/20/17 1600      Date of Initial Exercise RX and Referring Provider   Date 05/20/17   Referring Provider Dr. Nelda Marseille     Bike   Level 0.5   Minutes 17     NuStep   Level 2   Minutes 17     Track   Laps 10   Minutes 17     Prescription Details   Frequency (times per week) 2   Duration Progress to 45 minutes of aerobic exercise without signs/symptoms of physical distress     Intensity   THRR 40-80% of Max Heartrate 63-126   Ratings of Perceived Exertion 11-13   Perceived Dyspnea 0-4     Progression   Progression Continue progressive overload as per policy without signs/symptoms or physical distress.     Resistance Training  Training Prescription Yes   Weight blue bands   Reps 10-15      Perform Capillary Blood Glucose checks as needed.  Exercise Prescription Changes:     Exercise Prescription Changes    Row Name 05/27/17 1200 05/29/17 1202 06/17/17 1200 07/01/17 1200       Response to Exercise   Blood Pressure (Admit) 128/80 134/70 132/62 120/68    Blood Pressure (Exercise) 154/76 140/80 140/70 124/60    Blood Pressure (Exit) 122/70 134/60 122/68 118/62    Heart Rate (Admit) 85 bpm 88 bpm 90 bpm 96 bpm    Heart Rate (Exercise) 114 bpm 114 bpm 114 bpm 115 bpm    Heart Rate (Exit) 95 bpm 98 bpm 92 bpm 93 bpm    Oxygen Saturation (Admit) 98 % 97 % 99 % 96 %    Oxygen Saturation (Exercise) 91 % 91 % 89 % 95 %    Oxygen Saturation (Exit) 97 % 96 % 97 % 97 %    Rating of Perceived Exertion (Exercise) 13 12 13 15     Perceived Dyspnea (Exercise) 3 3 2 3     Duration Progress to 45 minutes of aerobic exercise without signs/symptoms of physical distress Progress to 45 minutes of aerobic exercise without signs/symptoms of physical distress Progress to 45 minutes of aerobic exercise without signs/symptoms of physical distress Progress to 45 minutes of aerobic exercise without signs/symptoms of physical distress    Intensity THRR unchanged THRR  unchanged THRR unchanged THRR unchanged      Resistance Training   Training Prescription Yes Yes Yes Yes    Weight blue bands blue bands blue bands blue bands    Reps 10-15 10-15 10-15 10-15      Bike   Level 2  scifit 3  scifit 3  scifit 4  scitfit    Minutes 17 17 17 17       NuStep   Level  - 3 3 4     Minutes  - 1.9 1.8 1.7      Track   Laps 16 15 16 6     Minutes 17 17 17 17        Exercise Comments:   Exercise Goals and Review:     Exercise Goals    Row Name 05/09/17 1045             Exercise Goals   Increase Physical Activity Yes       Intervention Provide advice, education, support and counseling about physical activity/exercise needs.;Develop an individualized exercise prescription for aerobic and resistive training based on initial evaluation findings, risk stratification, comorbidities and participant's personal goals.       Expected Outcomes Achievement of increased cardiorespiratory fitness and enhanced flexibility, muscular endurance and strength shown through measurements of functional capacity and personal statement of participant.       Increase Strength and Stamina Yes       Intervention Provide advice, education, support and counseling about physical activity/exercise needs.;Develop an individualized exercise prescription for aerobic and resistive training based on initial evaluation findings, risk stratification, comorbidities and participant's personal goals.       Expected Outcomes Achievement of increased cardiorespiratory fitness and enhanced flexibility, muscular endurance and strength shown through measurements of functional capacity and personal statement of participant.          Exercise Goals Re-Evaluation :     Exercise Goals Re-Evaluation    Row Name 06/02/17 1156 07/07/17 0710  Exercise Goal Re-Evaluation   Exercise Goals Review Increase Strength and Stamina;Increase Physical Activity;Able to understand and use Dyspnea  scale;Able to understand and use rate of perceived exertion (RPE) scale;Knowledge and understanding of Target Heart Rate Range (THRR);Understanding of Exercise Prescription Increase Strength and Stamina;Increase Physical Activity;Able to understand and use Dyspnea scale;Able to understand and use rate of perceived exertion (RPE) scale;Knowledge and understanding of Target Heart Rate Range (THRR);Understanding of Exercise Prescription      Comments Patient has only attended two exercise sessions. Will cont. to monitor and progress as able.  Patient has had leave of absense due to son being in the hospital. Will cont. to monitor and progress as able.       Expected Outcomes Through exercise and education at rehab and at home, patient will increase strength and stamina. Patient will also gain a better understanding of the need for physical activity on a daily basis and the effects it can have on quality of life. Through exercise and education at rehab and at home, patient will increase strength and stamina. Patient will also gain a better understanding of the need for physical activity on a daily basis and the effects it can have on quality of life.         Discharge Exercise Prescription (Final Exercise Prescription Changes):     Exercise Prescription Changes - 07/01/17 1200      Response to Exercise   Blood Pressure (Admit) 120/68   Blood Pressure (Exercise) 124/60   Blood Pressure (Exit) 118/62   Heart Rate (Admit) 96 bpm   Heart Rate (Exercise) 115 bpm   Heart Rate (Exit) 93 bpm   Oxygen Saturation (Admit) 96 %   Oxygen Saturation (Exercise) 95 %   Oxygen Saturation (Exit) 97 %   Rating of Perceived Exertion (Exercise) 15   Perceived Dyspnea (Exercise) 3   Duration Progress to 45 minutes of aerobic exercise without signs/symptoms of physical distress   Intensity THRR unchanged     Resistance Training   Training Prescription Yes   Weight blue bands   Reps 10-15     Bike   Level 4   scitfit   Minutes 17     NuStep   Level 4   Minutes 1.7     Track   Laps 6   Minutes 17      Nutrition:  Target Goals: Understanding of nutrition guidelines, daily intake of sodium 1500mg , cholesterol 200mg , calories 30% from fat and 7% or less from saturated fats, daily to have 5 or more servings of fruits and vegetables.  Biometrics:    Nutrition Therapy Plan and Nutrition Goals:   Nutrition Discharge: Rate Your Plate Scores:   Nutrition Goals Re-Evaluation:   Nutrition Goals Discharge (Final Nutrition Goals Re-Evaluation):   Psychosocial: Target Goals: Acknowledge presence or absence of significant depression and/or stress, maximize coping skills, provide positive support system. Participant is able to verbalize types and ability to use techniques and skills needed for reducing stress and depression.  Initial Review & Psychosocial Screening:     Initial Psych Review & Screening - 05/09/17 1119      Initial Review   Current issues with Current Depression;History of Depression;Current Stress Concerns   Source of Stress Concerns Family   Comments son's illness and loss of husband unexpectedly     Family Dynamics   Good Support System? Yes   Concerns Recent loss of significant other     Barriers   Psychosocial barriers to participate in program  There are no identifiable barriers or psychosocial needs.  although patient has significant psychosocial issues currently she does not feel they will cause barriers to her participation in pulmonary rehab     Screening Interventions   Interventions Encouraged to exercise;Provide feedback about the scores to participant      Quality of Life Scores:   PHQ-9: Recent Review Flowsheet Data    Depression screen [facility] 2/9 05/09/2017 01/06/2017 12/02/2016 11/24/2015 11/21/2014   Decreased Interest 2 1 0  1 0   Down, Depressed, Hopeless 1 1 0 1 1   PHQ - 2 Score 3 2 0 2 1   Altered sleeping 1 0 0 2 -   Tired, decreased  energy 2 0 0 2 -   Change in appetite 3 0 0 2 -   Feeling bad or failure about yourself  3 0 0 1 -   Trouble concentrating 3 0 0 1 -   Moving slowly or fidgety/restless 0 0 0 0 -   Suicidal thoughts 0 0 0 0 -   PHQ-9 Score 15 2 0 10 -   Difficult doing work/chores Somewhat difficult - - Not difficult at all -     Interpretation of Total Score  Total Score Depression Severity:  1-4 = Minimal depression, 5-9 = Mild depression, 10-14 = Moderate depression, 15-19 = Moderately severe depression, 20-27 = Severe depression   Psychosocial Evaluation and Intervention:     Psychosocial Evaluation - 05/09/17 1123      Psychosocial Evaluation & Interventions   Interventions Encouraged to exercise with the program and follow exercise prescription   Expected Outcomes psychococial issues that patient currently dealing with will not become barriers to participation   Continue Psychosocial Services  Follow up required by staff      Psychosocial Re-Evaluation:     Psychosocial Re-Evaluation    [place] Name 06/06/17 1206 07/06/17 2157           Psychosocial Re-Evaluation   Current issues with Current Depression;History of Depression;Current Stress Concerns Current Depression;History of Depression;Current Stress Concerns      Comments patient appears to enjoy pulmonary rehab. she smiles and interacts with her classmates. patient appears to enjoy pulmonary rehab. she smiles and interacts with her classmates however when asked how things are with her son she begins to cry. she continues to see several therapist in a group and private setting.      Expected Outcomes patient will remain free from psychosocial barriers to participation patient will remain free from psychosocial barriers to participation      Interventions -  currently in counceling Encouraged to attend Pulmonary Rehabilitation for the exercise  continue with current therapist and physician treatment for depression      Continue  Psychosocial Services  Follow up required by staff Follow up required by staff      Comments son's illness and loss of husband unexpectedly son's illness and loss of husband unexpectedly        Initial Review   Source of Stress Concerns Family Family         Psychosocial Discharge (Final Psychosocial Re-Evaluation):     Psychosocial Re-Evaluation - 07/06/17 2157      Psychosocial Re-Evaluation   Current issues with Current Depression;History of Depression;Current Stress Concerns   Comments patient appears to enjoy pulmonary rehab. she smiles and interacts with her classmates however when asked how things are with her son she begins to cry. she continues to see several therapist in a group  and private setting.   Expected Outcomes patient will remain free from psychosocial barriers to participation   Interventions Encouraged to attend Pulmonary Rehabilitation for the exercise  continue with current therapist and physician treatment for depression   Continue Psychosocial Services  Follow up required by staff   Comments son's illness and loss of husband unexpectedly     Initial Review   Source of Stress Concerns Family      Education: Education Goals: Education classes will be provided on a weekly basis, covering required topics. Participant will state understanding/return demonstration of topics presented.  Learning Barriers/Preferences:     Learning Barriers/Preferences - 05/09/17 1116      Learning Barriers/Preferences   Learning Barriers None   Learning Preferences Written Material      Education Topics: Risk Factor Reduction:  -Group instruction that is supported by a PowerPoint presentation. Instructor discusses the definition of a risk factor, different risk factors for pulmonary disease, and how the heart and lungs work together.     Nutrition for Pulmonary Patient:  -Group instruction provided by PowerPoint slides, verbal discussion, and written materials to  support subject matter. The instructor gives an explanation and review of healthy diet recommendations, which includes a discussion on weight management, recommendations for fruit and vegetable consumption, as well as protein, fluid, caffeine, fiber, sodium, sugar, and alcohol. Tips for eating when patients are short of breath are discussed.   Pursed Lip Breathing:  -Group instruction that is supported by demonstration and informational handouts. Instructor discusses the benefits of pursed lip and diaphragmatic breathing and detailed demonstration on how to preform both.     Oxygen Safety:  -Group instruction provided by PowerPoint, verbal discussion, and written material to support subject matter. There is an overview of "What is Oxygen" and "Why do we need it".  Instructor also reviews how to create a safe environment for oxygen use, the importance of using oxygen as prescribed, and the risks of noncompliance. There is a brief discussion on traveling with oxygen and resources the patient may utilize.   Oxygen Equipment:  -Group instruction provided by [facility] Staff utilizing handouts, written materials, and equipment demonstrations.   Signs and Symptoms:  -Group instruction provided by written material and verbal discussion to support subject matter. Warning signs and symptoms of infection, stroke, and heart attack are reviewed and when to call the physician/911 reinforced. Tips for preventing the spread of infection discussed.   Advanced Directives:  -Group instruction provided by verbal instruction and written material to support subject matter. Instructor reviews Advanced Directive laws and proper instruction for filling out document.   Pulmonary Video:  -Group video education that reviews the importance of medication and oxygen compliance, exercise, good nutrition, pulmonary hygiene, and pursed lip and diaphragmatic breathing for the pulmonary patient.   Exercise for the  Pulmonary Patient:  -Group instruction that is supported by a PowerPoint presentation. Instructor discusses benefits of exercise, core components of exercise, frequency, duration, and intensity of an exercise routine, importance of utilizing pulse oximetry during exercise, safety while exercising, and options of places to exercise outside of rehab.     PULMONARY REHAB OTHER RESPIRATORY from 05/27/2017 in [facility]  Date  05/27/17  Educator  ep  Instruction Review Code  2- meets goals/outcomes      Pulmonary Medications:  -Verbally interactive group education provided by instructor with focus on inhaled medications and proper administration.   Anatomy and Physiology of the Respiratory System and Intimacy:  -  Group instruction provided by PowerPoint, verbal discussion, and written material to support subject matter. Instructor reviews respiratory cycle and anatomical components of the respiratory system and their functions. Instructor also reviews differences in obstructive and restrictive respiratory diseases with examples of each. Intimacy, Sex, and Sexuality differences are reviewed with a discussion on how relationships can change when diagnosed with pulmonary disease. Common sexual concerns are reviewed.   MD DAY -A group question and answer session with a medical doctor that allows participants to ask questions that relate to their pulmonary disease state.   OTHER EDUCATION -Group or individual verbal, written, or video instructions that support the educational goals of the pulmonary rehab program.   Knowledge Questionnaire Score:   Core Components/Risk Factors/Patient Goals at Admission:     Personal Goals and Risk Factors at Admission - 05/09/17 1116      Core Components/Risk Factors/Patient Goals on Admission    Weight Management Yes;Obesity   Intervention Weight Management: Develop a combined nutrition and exercise program designed to reach  desired caloric intake, while maintaining appropriate intake of nutrient and fiber, sodium and fats, and appropriate energy expenditure required for the weight goal.;Weight Management/Obesity: Establish reasonable short term and long term weight goals.;Obesity: Provide education and appropriate resources to help participant work on and attain dietary goals.   Expected Outcomes Short Term: Continue to assess and modify interventions until short term weight is achieved;Long Term: Adherence to nutrition and physical activity/exercise program aimed toward attainment of established weight goal;Weight Loss: Understanding of general recommendations for a balanced deficit meal plan, which promotes 1-2 lb weight loss per week and includes a negative energy balance of [phone] kcal/d;Understanding recommendations for meals to include 15-35% energy as protein, 25-35% energy from fat, 35-60% energy from carbohydrates, less than 200mg  of dietary cholesterol, 20-35 gm of total fiber daily;Understanding of distribution of calorie intake throughout the day with the consumption of 4-5 meals/snacks   Improve shortness of breath with ADL's Yes   Intervention Provide education, individualized exercise plan and daily activity instruction to help decrease symptoms of SOB with activities of daily living.   Expected Outcomes Short Term: Achieves a reduction of symptoms when performing activities of daily living.   Develop more efficient breathing techniques such as purse lipped breathing and diaphragmatic breathing; and practicing self-pacing with activity Yes   Intervention Provide education, demonstration and support about specific breathing techniuqes utilized for more efficient breathing. Include techniques such as pursed lipped breathing, diaphragmatic breathing and self-pacing activity.   Expected Outcomes Short Term: Participant will be able to demonstrate and use breathing techniques as needed throughout daily activities.       Core Components/Risk Factors/Patient Goals Review:      Goals and Risk Factor Review    Row Name 06/06/17 1205 07/06/17 2154           Core Components/Risk Factors/Patient Goals Review   Personal Goals Review Weight Management/Obesity;Improve shortness of breath with ADL's;Develop more efficient breathing techniques such as purse lipped breathing and diaphragmatic breathing and practicing self-pacing with activity. Weight Management/Obesity;Improve shortness of breath with ADL's;Develop more efficient breathing techniques such as purse lipped breathing and diaphragmatic breathing and practicing self-pacing with activity.      Review patient has attended only 2 sessions since admission and too early to evaluate progress towards goals patient has only attended 4 sessions since admission. She has several barriers to participation. her son has been denied a lung transplant related to alcohol and drug addictions. he is end stage CF  and 63 years old. She also is experiencing what she describes as palpatations during exertion and at rest but this cannont be seen on the cardiac monitor. She is not making any progress towards goals related to her inconsistant attendance.      Expected Outcomes see admission outcomes see admission outcomes         Core Components/Risk Factors/Patient Goals at Discharge (Final Review):      Goals and Risk Factor Review - 07/06/17 2154      Core Components/Risk Factors/Patient Goals Review   Personal Goals Review Weight Management/Obesity;Improve shortness of breath with ADL's;Develop more efficient breathing techniques such as purse lipped breathing and diaphragmatic breathing and practicing self-pacing with activity.   Review patient has only attended 4 sessions since admission. She has several barriers to participation. her son has been denied a lung transplant related to alcohol and drug addictions. he is end stage CF and 63 years old. She also is experiencing  what she describes as palpatations during exertion and at rest but this cannont be seen on the cardiac monitor. She is not making any progress towards goals related to her inconsistant attendance.   Expected Outcomes see admission outcomes      ITP Comments:   Comments: ITP REVIEW Pt is making no progress toward pulmonary rehab goals after completing 4 sessions related to lack of consistent attendance. Recommend continued exercise, life style modification, education, and utilization of breathing techniques to increase stamina and strength and decrease shortness of breath with exertion.

## 2017-07-08 ENCOUNTER — Encounter (HOSPITAL_COMMUNITY)
Admission: RE | Admit: 2017-07-08 | Discharge: 2017-07-08 | Disposition: A | Discharge status: Home / Self Care | Payer: BLUE CROSS/BLUE SHIELD | Source: Ambulatory Visit | Attending: Pulmonary Disease | Admitting: Pulmonary Disease

## 2017-07-08 VITALS — Wt 248.7 lb

## 2017-07-08 DIAGNOSIS — J849 Interstitial pulmonary disease, unspecified: Secondary | ICD-10-CM

## 2017-07-08 NOTE — Progress Notes (Signed)
Daily Session Note  Patient Details  Name: Stacey Alvarez MRN: [id number] Date of Birth: 05/24/1956 Referring Provider:     Pulmonary Rehab Walk Test from 05/20/2017 in [facility]  Referring Provider  Dr. Nelda Marseille      Encounter Date: 07/08/2017  Check In:     Session Check In - 07/08/17 1030      Check-In   Location [facility]   Staff Present Rosebud Poles, RN, BSN;Jasiya Markie, MS, ACSM RCEP, Exercise Physiologist;Portia Rollene Rotunda, RN, Roque Cash, RN   Supervising physician immediately available to respond to emergencies [facility] immediately available   Physician(s) [place]   Medication changes reported     No   Fall or balance concerns reported    No   Tobacco Cessation No Change   Warm-up and Cool-down Performed as group-led instruction   Resistance Training Performed Yes   VAD Patient? No     Pain Assessment   Currently in Pain? No/denies   Multiple Pain Sites No      Capillary Blood Glucose: No results found for this or any previous visit (from the past 24 hour(s)).      Exercise Prescription Changes - 07/08/17 1200      Response to Exercise   Blood Pressure (Admit) 124/68   Blood Pressure (Exercise) 136/70   Blood Pressure (Exit) 140/84   Heart Rate (Admit) 89 bpm   Heart Rate (Exercise) 100 bpm   Heart Rate (Exit) 88 bpm   Oxygen Saturation (Admit) 96 %   Oxygen Saturation (Exercise) 96 %   Oxygen Saturation (Exit) 94 %   Rating of Perceived Exertion (Exercise) 9   Perceived Dyspnea (Exercise) 0   Duration Progress to 45 minutes of aerobic exercise without signs/symptoms of physical distress   Intensity THRR unchanged     Resistance Training   Training Prescription Yes   Weight blue bands   Reps 10-15     Bike   Level --  scitfit     NuStep   Level 4   Minutes 1.9      History  Smoking Status  . Former Smoker  Smokeless Tobacco  . Never Used    Comment: smoked 2 years in late  teens about 8-10 cigs daily.     Goals Met:  Exercise tolerated well No report of cardiac concerns or symptoms Strength training completed today  Goals Unmet:  Not Applicable  Comments: Service time is from 10:30a to 11:30a The patient left early due to an emotional upset.     Dr. Rush Farmer is Medical Director for Pulmonary Rehab at [facility].

## 2017-07-10 ENCOUNTER — Encounter (HOSPITAL_COMMUNITY): Payer: BLUE CROSS/BLUE SHIELD

## 2017-07-11 ENCOUNTER — Encounter: Payer: Self-pay | Admitting: Family Medicine

## 2017-07-11 ENCOUNTER — Ambulatory Visit (INDEPENDENT_AMBULATORY_CARE_PROVIDER_SITE_OTHER): Payer: BLUE CROSS/BLUE SHIELD | Admitting: Family Medicine

## 2017-07-11 VITALS — BP 130/86 | HR 90 | Temp 97.9°F | Resp 16 | Ht 64.5 in | Wt 252.0 lb

## 2017-07-11 DIAGNOSIS — I10 Essential (primary) hypertension: Secondary | ICD-10-CM

## 2017-07-11 DIAGNOSIS — Z23 Encounter for immunization: Secondary | ICD-10-CM

## 2017-07-11 DIAGNOSIS — R002 Palpitations: Secondary | ICD-10-CM | POA: Diagnosis not present

## 2017-07-11 DIAGNOSIS — E785 Hyperlipidemia, unspecified: Secondary | ICD-10-CM

## 2017-07-11 LAB — LIPID PANEL
CHOL/HDL RATIO: 4
Cholesterol: 208 mg/dL — ABNORMAL HIGH (ref 0–200)
HDL: 54.5 mg/dL (ref >39.00)
LDL Cholesterol: 123 mg/dL — ABNORMAL HIGH (ref 0–99)
NONHDL: 153.52
Triglycerides: 153 mg/dL — ABNORMAL HIGH (ref 0.0–149.0)
VLDL: 30.6 mg/dL (ref 0.0–40.0)

## 2017-07-11 LAB — HEPATIC FUNCTION PANEL
ALT: 15 U/L (ref 0–35)
AST: 14 U/L (ref 0–37)
Albumin: 4.4 g/dL (ref 3.5–5.2)
Alkaline Phosphatase: 69 U/L (ref 39–117)
BILIRUBIN DIRECT: 0.1 mg/dL (ref 0.0–0.3)
BILIRUBIN TOTAL: 0.4 mg/dL (ref 0.2–1.2)
Total Protein: 6.8 g/dL (ref 6.0–8.3)

## 2017-07-11 LAB — CBC WITH DIFFERENTIAL/PLATELET
BASOS PCT: 0.7 % (ref 0.0–3.0)
Basophils Absolute: 0 10*3/uL (ref 0.0–0.1)
EOS ABS: 0.1 10*3/uL (ref 0.0–0.7)
EOS PCT: 1.5 % (ref 0.0–5.0)
HEMATOCRIT: 38.5 % (ref 36.0–46.0)
HEMOGLOBIN: 12.3 g/dL (ref 12.0–15.0)
LYMPHS PCT: 18.6 % (ref 12.0–46.0)
Lymphs Abs: 1.3 10*3/uL (ref 0.7–4.0)
MCHC: 32 g/dL (ref 30.0–36.0)
MCV: 81.9 fl (ref 78.0–100.0)
Monocytes Absolute: 0.3 10*3/uL (ref 0.1–1.0)
Monocytes Relative: 4.3 % (ref 3.0–12.0)
NEUTROS ABS: 5.3 10*3/uL (ref 1.4–7.7)
Neutrophils Relative %: 74.9 % (ref 43.0–77.0)
PLATELETS: 329 10*3/uL (ref 150.0–400.0)
RBC: 4.7 Mil/uL (ref 3.87–5.11)
RDW: 14.2 % (ref 11.5–15.5)
WBC: 7 10*3/uL (ref 4.0–10.5)

## 2017-07-11 LAB — BASIC METABOLIC PANEL
BUN: 11 mg/dL (ref 6–23)
CALCIUM: 9.5 mg/dL (ref 8.4–10.5)
CHLORIDE: 99 meq/L (ref 96–112)
CO2: 30 meq/L (ref 19–32)
Creatinine, Ser: 0.65 mg/dL (ref 0.40–1.20)
GFR: 97.64 mL/min (ref >60.00)
Glucose, Bld: 119 mg/dL — ABNORMAL HIGH (ref 70–99)
Potassium: 4 mEq/L (ref 3.5–5.1)
SODIUM: 137 meq/L (ref 135–145)

## 2017-07-11 LAB — TSH: TSH: 0.48 u[IU]/mL (ref 0.35–4.50)

## 2017-07-11 LAB — HEMOGLOBIN A1C: Hgb A1c MFr Bld: 5.5 % (ref 4.6–6.5)

## 2017-07-11 MED ORDER — BUTALBITAL-ASA-CAFF-CODEINE 50-325-40-30 MG PO CAPS: ORAL_CAPSULE | ORAL | 0 refills | Status: DC

## 2017-07-11 NOTE — Patient Instructions (Signed)
Schedule your complete physical in 6 months We'll notify you of your lab results and make any changes if needed We'll call you with your Cardiology appt to evaluate the pounding heart Call with any questions or concerns Happy Fall!!!

## 2017-07-11 NOTE — Assessment & Plan Note (Signed)
Chronic problem.  Pt's pressure is adequately control and HR and O2 levels WNL today but she reports that her heart will 'about pound out of my chest' w/ any exertion.  Based on this, will refer to Cardiology for complete evaluation.  Pt expressed understanding and is in agreement w/ plan.

## 2017-07-11 NOTE — Assessment & Plan Note (Signed)
Ongoing issue for pt.  Stressed the need for healthy diet and regular exercise.  Check labs to risk stratify.  Will follow closely.

## 2017-07-11 NOTE — Assessment & Plan Note (Signed)
Chronic problem.  Tolerating statin and fenofibrate w/o difficulty.  Check labs.  Adjust meds prn  

## 2017-07-11 NOTE — Progress Notes (Signed)
   Subjective:    Patient ID: Stacey Alvarez, female    DOB: [birth year], 61 y.o.   MRN: [id number]  HPI HTN- chronic problem, on Amlodipine 5, Lisinopril 20 daily w/ adequate control.   Denies HAs, visual changes.  Hyperlipidemia- chronic problem, on Pravastatin and Fenofibrate daily.  Denies abd pain, N/V.  Obesity- ongoing issue for pt.  BMI is 42.59.  'Heart pounding'- pt reports that during Pulmonary Rehab for ILD that her HR and O2 levels were WNL but 'it felt that my heart was pounding out of my chest'.  No CP.  + SOB at baseline w/ exertion due to lung disease.  Review of Systems For ROS see HPI     Objective:   Physical Exam  Constitutional: She is oriented to person, place, and time. She appears well-developed and well-nourished. No distress.  obese  HENT:  Head: Normocephalic and atraumatic.  Eyes: Pupils are equal, round, and reactive to light. Conjunctivae and EOM are normal.  Neck: Normal range of motion. Neck supple. No thyromegaly present.  Cardiovascular: Normal rate, regular rhythm, normal heart sounds and intact distal pulses.   No murmur heard. Pulmonary/Chest: Effort normal and breath sounds normal. No respiratory distress.  Abdominal: Soft. She exhibits no distension. There is no tenderness.  Musculoskeletal: She exhibits no edema.  Lymphadenopathy:    She has no cervical adenopathy.  Neurological: She is alert and oriented to person, place, and time.  Skin: Skin is warm and dry.  Psychiatric: She has a normal mood and affect. Her behavior is normal.  Vitals reviewed.         Assessment & Plan:

## 2017-07-15 ENCOUNTER — Encounter (HOSPITAL_COMMUNITY): Payer: BLUE CROSS/BLUE SHIELD

## 2017-07-17 ENCOUNTER — Encounter (HOSPITAL_COMMUNITY): Payer: BLUE CROSS/BLUE SHIELD

## 2017-07-21 MED ORDER — RIVAROXABAN 20 MG TABLET
ORAL_TABLET | Freq: Every day | ORAL | 2 refills | 0.00000 days | Status: CP
Start: 2017-07-21 — End: 2017-10-10

## 2017-07-21 MED ORDER — AZITHROMYCIN 250 MG TABLET
ORAL_TABLET | Freq: Every day | ORAL | 6 refills | 0 days | Status: CP
Start: 2017-07-21 — End: 2017-08-11

## 2017-07-22 ENCOUNTER — Encounter (HOSPITAL_COMMUNITY): Payer: BLUE CROSS/BLUE SHIELD

## 2017-07-22 NOTE — Unmapped (Signed)
Refill requested.  [facility] LPN

## 2017-07-22 NOTE — Unmapped (Signed)
[facility] Refill Coordination Note  Specialty Medication(s): MYCOPHENOLATE 500MG   Additional Medications shipped: Amber Bush, DOB: 16-Jun-1956  Phone: [phone] (home) , Alternate phone contact: N/A  Phone or address changes today?: No  All above HIPAA information was verified with patient.  Shipping Address: [address]  [place] [address]   Insurance changes? No    Completed refill call assessment today to schedule patient's medication shipment from the [facility] [phone]).      Confirmed the medication and dosage are correct and have not changed: Yes, regimen is correct and unchanged.    Confirmed patient started or stopped the following medications in the past month:  No, there are no changes reported at this time.    Are you tolerating your medication?:  Amber Bush reports tolerating the medication.    ADHERENCE    Mycophenolate Mofetil 500 mg   Quantity filled last month: 120   # of tablets left on hand: 28    Did you miss any doses in the past 4 weeks? No missed doses reported.    FINANCIAL/SHIPPING    Delivery Scheduled: Yes, Expected medication delivery date: 07/25/17     Amber Bush did not have any additional questions at this time.    Delivery address validated in [facility] scheduling system: Yes, address listed in [facility] is correct.    We will follow up with patient monthly for standard refill processing and delivery.      Thank you,  Thad Ranger   [facility] Specialty Pharmacist

## 2017-07-23 MED ORDER — MYCOPHENOLATE MOFETIL 500 MG TABLET
ORAL_TABLET | Freq: Two times a day (BID) | ORAL | 6 refills | 0.00000 days | Status: CP
Start: 2017-07-23 — End: 2017-10-27

## 2017-07-24 ENCOUNTER — Encounter (HOSPITAL_COMMUNITY): Payer: BLUE CROSS/BLUE SHIELD

## 2017-07-24 MED FILL — MYCOPHENOLATE/500MG/TABS: MYCOPHENOLATE/500MG/TABS | 30 days supply | Qty: 120 | Fill #0

## 2017-07-29 ENCOUNTER — Encounter (HOSPITAL_COMMUNITY)
Admission: RE | Admit: 2017-07-29 | Discharge: 2017-07-29 | Disposition: A | Discharge status: Home / Self Care | Payer: BLUE CROSS/BLUE SHIELD | Source: Ambulatory Visit | Attending: Pulmonary Disease | Admitting: Pulmonary Disease

## 2017-07-29 DIAGNOSIS — J849 Interstitial pulmonary disease, unspecified: Secondary | ICD-10-CM | POA: Insufficient documentation

## 2017-07-31 ENCOUNTER — Encounter (HOSPITAL_COMMUNITY): Payer: BLUE CROSS/BLUE SHIELD

## 2017-07-31 DIAGNOSIS — G4733 Obstructive sleep apnea (adult) (pediatric): Secondary | ICD-10-CM | POA: Diagnosis not present

## 2017-07-31 NOTE — Progress Notes (Signed)
Pulmonary Individual Treatment Plan  Patient Details  Name: Stacey Alvarez MRN: [id number] Date of Birth: 03/15/1956 Referring Provider:     Pulmonary Rehab Walk Test from 05/20/2017 in [facility]  Referring Provider  Dr. Nelda Marseille      Initial Encounter Date:    Pulmonary Rehab Walk Test from 05/20/2017 in [facility]  Date  05/20/17  Referring Provider  Dr. Nelda Marseille      Visit Diagnosis: ILD (interstitial lung disease) ([place])  Patient's Home Medications on Admission:   Current Outpatient Medications:  .  ALPRAZolam (XANAX) 1 MG tablet, 0.5 mg in the am and 5m at night, Disp: , Rfl:  .  amLODipine (NORVASC) 5 MG tablet, TAKE 1 TABLET(5 MG) BY MOUTH AT BEDTIME, Disp: 90 tablet, Rfl: 0 .  azithromycin (ZITHROMAX) 250 MG tablet, TK 1 T PO QD, Disp: , Rfl: 6 .  butalbital-aspirin-caffeine-codeine (FIORINAL WITH CODEINE) 50-325-40-30 MG capsule, TAKE 1 CAPSULE BY MOUTH EVERY 6 HOURS AS NEEDED FOR PAIN, Disp: 30 capsule, Rfl: 0 .  cetirizine (ZYRTEC) 10 MG tablet, Take 10 mg by mouth daily., Disp: , Rfl:  .  cyclobenzaprine (FLEXERIL) 10 MG tablet, Take 1 tablet (10 mg total) by mouth 3 (three) times daily as needed for muscle spasms., Disp: 90 tablet, Rfl: 0 .  fenofibrate 160 MG tablet, TAKE 1 TABLET(160 MG) BY MOUTH DAILY, Disp: 90 tablet, Rfl: 0 .  FLUoxetine (PROZAC) 20 MG tablet, Take 40 mg by mouth daily., Disp: , Rfl:  .  gabapentin (NEURONTIN) 600 MG tablet, TK 1 TO 2 TS PO QHS, Disp: , Rfl: 4 .  lamoTRIgine (LAMICTAL) 100 MG tablet, Take 100 mg by mouth daily., Disp: , Rfl:  .  lisinopril (PRINIVIL,ZESTRIL) 20 MG tablet, TAKE 1 TABLET(20 MG) BY MOUTH TWICE DAILY, Disp: 180 tablet, Rfl: 0 .  meclizine (ANTIVERT) 25 MG tablet, Take 1 tablet (25 mg total) by mouth 3 (three) times daily as needed for dizziness., Disp: 45 tablet, Rfl: 0 .  mycophenolate (CELLCEPT) 500 MG tablet, Take 1,000 mg by mouth 2 (two) times daily. , Disp: ,  Rfl:  .  OXYGEN, Inhale 2 L into the lungs as needed (SOB)., Disp: , Rfl:  .  pantoprazole (PROTONIX) 40 MG tablet, TAKE ONE TABLET BY MOUTH DAILY, Disp: 30 tablet, Rfl: 6 .  pravastatin (PRAVACHOL) 20 MG tablet, TAKE 1 TABLET(20 MG) BY MOUTH DAILY, Disp: 90 tablet, Rfl: 0 .  predniSONE (DELTASONE) 10 MG tablet, Take 5 mg by mouth daily., Disp: , Rfl: 11 .  promethazine (PHENERGAN) 25 MG tablet, Take 1 tablet (25 mg total) by mouth every 8 (eight) hours as needed for nausea or vomiting., Disp: 45 tablet, Rfl: 1 .  rivaroxaban (XARELTO) 20 MG TABS tablet, Take 1 tablet (20 mg total) by mouth daily with supper., Disp: 30 tablet, Rfl: 5 .  temazepam (RESTORIL) 30 MG capsule, Take 30 mg by mouth at bedtime., Disp: , Rfl:   Past Medical History: Past Medical History:  Diagnosis Date  . Allergy   . Anxiety   . Arthritis    KNEES,BACK,HIPS  . Depression   . Fibromyalgia   . GERD (gastroesophageal reflux disease)   . Hyperlipidemia   . Hypertension   . IBS (irritable bowel syndrome)   . [place]   . Sleep apnea     Tobacco Use: Social History   Tobacco Use  Smoking Status Former Smoker  Smokeless Tobacco Never Used  Tobacco  Comment   smoked 2 years in late teens about 8-10 cigs daily.     Labs: Recent Review Flowsheet Data    Labs for ITP Cardiac and Pulmonary Rehab Latest Ref Rng & Units 06/12/2016 10/07/2016 10/07/2016 01/06/2017 07/11/2017   Cholestrol 0 - 200 mg/dL 241(H) - - 225(H) 208(H)   LDLCALC 0 - 99 mg/dL - - - 125(H) 123(H)   LDLDIRECT mg/dL 144.0 - - - -   HDL >39.00 mg/dL 52.50 - - 64.30 54.50   Trlycerides 0.0 - 149.0 mg/dL 277.0(H) - - 178.0(H) 153.0(H)   Hemoglobin A1c 4.6 - 6.5 % - - - - 5.5   PHART 7.350 - 7.450 - - 7.416 - -   PCO2ART 32.0 - 48.0 mmHg - - 38.9 - -   HCO3 20.0 - 28.0 mmol/L - 24.3 24.9 - -   TCO2 0 - 100 mmol/L - 25 26 - -   O2SAT % - 94.0 97.0 - -      Capillary Blood Glucose: No results found for:  GLUCAP   Pulmonary Assessment Scores:   Pulmonary Function Assessment:   Exercise Target Goals:    Exercise Program Goal: Individual exercise prescription set with THRR, safety & activity barriers. Participant demonstrates ability to understand and report RPE using BORG scale, to self-measure pulse accurately, and to acknowledge the importance of the exercise prescription.  Exercise Prescription Goal: Starting with aerobic activity 30 plus minutes a day, 3 days per week for initial exercise prescription. Provide home exercise prescription and guidelines that participant acknowledges understanding prior to discharge.  Activity Barriers & Risk Stratification: Activity Barriers & Cardiac Risk Stratification - 05/09/17 1044      Activity Barriers & Cardiac Risk Stratification   Activity Barriers  Deconditioning;Shortness of Breath;Arthritis       6 Minute Walk: 6 Minute Walk    Row Name 05/20/17 1632         6 Minute Walk   Phase  Initial     Distance  1610 feet     Walk Time  6 minutes     # of Rest Breaks  0     MPH  3.04     METS  3.3     RPE  13     Perceived Dyspnea   3     Symptoms  No     Resting HR  102 bpm     Resting BP  135/84     Resting Oxygen Saturation   97 %     Exercise Oxygen Saturation  during 6 min walk  90 %     Max Ex. HR  120 bpm     Max Ex. BP  142/72       Interval HR   1 Minute HR  102     2 Minute HR  116     3 Minute HR  119     4 Minute HR  119     5 Minute HR  119     6 Minute HR  120     2 Minute Post HR  113     Interval Heart Rate?  Yes       Interval Oxygen   Interval Oxygen?  Yes     Baseline Oxygen Saturation %  97 %     1 Minute Oxygen Saturation %  96 %     1 Minute Liters of Oxygen  0 L     2 Minute Oxygen Saturation %  91 %     2 Minute Liters of Oxygen  0 L     3 Minute Oxygen Saturation %  91 %     3 Minute Liters of Oxygen  0 L     4 Minute Oxygen Saturation %  91 %     4 Minute Liters of Oxygen  0 L     5  Minute Oxygen Saturation %  90 %     5 Minute Liters of Oxygen  0 L     6 Minute Oxygen Saturation %  90 %     6 Minute Liters of Oxygen  0 L     2 Minute Post Oxygen Saturation %  92 %     2 Minute Post Liters of Oxygen  0 L        Oxygen Initial Assessment: Oxygen Initial Assessment - 05/20/17 1640      Initial 6 min Walk   Oxygen Used  None      Program Oxygen Prescription   Program Oxygen Prescription  None       Oxygen Re-Evaluation: Oxygen Re-Evaluation    Row Name 06/06/17 1204 07/06/17 2153 07/28/17 1615         Program Oxygen Prescription   Program Oxygen Prescription  None  None  None       Home Oxygen   Home Oxygen Device  Home Concentrator;E-Tanks  Home Concentrator;E-Tanks  Home Concentrator;E-Tanks     Sleep Oxygen Prescription  CPAP  CPAP  CPAP     Home Exercise Oxygen Prescription  None  None  None     Liters per minute  -  -  0     Home at Rest Exercise Oxygen Prescription  None  None  None     Compliance with Home Oxygen Use  -  -  Yes       Goals/Expected Outcomes   Comments  patient has been able to ween off oxygen since placed on it during hospitalization  patient continue to remain off oxygen during pulmonary rehab and saturation are staying in the 90s with maximum exertion  patient continue to remain off oxygen during pulmonary rehab and saturation are staying in the 90s with maximum exertion     Goals/Expected Outcomes  -  see admission outcomes  -        Oxygen Discharge (Final Oxygen Re-Evaluation): Oxygen Re-Evaluation - 07/28/17 1615      Program Oxygen Prescription   Program Oxygen Prescription  None      Home Oxygen   Home Oxygen Device  Home Concentrator;E-Tanks    Sleep Oxygen Prescription  CPAP    Home Exercise Oxygen Prescription  None    Liters per minute  0    Home at Rest Exercise Oxygen Prescription  None    Compliance with Home Oxygen Use  Yes      Goals/Expected Outcomes   Comments  patient continue to remain off oxygen  during pulmonary rehab and saturation are staying in the 90s with maximum exertion       Initial Exercise Prescription: Initial Exercise Prescription - 05/20/17 1600      Date of Initial Exercise RX and Referring Provider   Date  05/20/17    Referring Provider  Dr. Nelda Marseille      Bike   Level  0.5    Minutes  17      NuStep   Level  2    Minutes  17  Track   Laps  10    Minutes  17      Prescription Details   Frequency (times per week)  2    Duration  Progress to 45 minutes of aerobic exercise without signs/symptoms of physical distress      Intensity   THRR 40-80% of Max Heartrate  63-126    Ratings of Perceived Exertion  11-13    Perceived Dyspnea  0-4      Progression   Progression  Continue progressive overload as per policy without signs/symptoms or physical distress.      Resistance Training   Training Prescription  Yes    Weight  blue bands    Reps  10-15       Perform Capillary Blood Glucose checks as needed.  Exercise Prescription Changes: Exercise Prescription Changes    Row Name 05/27/17 1200 05/29/17 1202 06/17/17 1200 07/01/17 1200 07/08/17 1200     Response to Exercise   Blood Pressure (Admit)  128/80  134/70  132/62  120/68  124/68   Blood Pressure (Exercise)  154/76  140/80  140/70  124/60  136/70   Blood Pressure (Exit)  122/70  134/60  122/68  118/62  140/84   Heart Rate (Admit)  85 bpm  88 bpm  90 bpm  96 bpm  89 bpm   Heart Rate (Exercise)  114 bpm  114 bpm  114 bpm  115 bpm  100 bpm   Heart Rate (Exit)  95 bpm  98 bpm  92 bpm  93 bpm  88 bpm   Oxygen Saturation (Admit)  98 %  97 %  99 %  96 %  96 %   Oxygen Saturation (Exercise)  91 %  91 %  89 %  95 %  96 %   Oxygen Saturation (Exit)  97 %  96 %  97 %  97 %  94 %   Rating of Perceived Exertion (Exercise)  13  12  13  15  9    Perceived Dyspnea (Exercise)  3  3  2  3   0   Duration  Progress to 45 minutes of aerobic exercise without signs/symptoms of physical distress  Progress to 45  minutes of aerobic exercise without signs/symptoms of physical distress  Progress to 45 minutes of aerobic exercise without signs/symptoms of physical distress  Progress to 45 minutes of aerobic exercise without signs/symptoms of physical distress  Progress to 45 minutes of aerobic exercise without signs/symptoms of physical distress   Intensity  THRR unchanged  THRR unchanged  THRR unchanged  THRR unchanged  THRR unchanged     Resistance Training   Training Prescription  Yes  Yes  Yes  Yes  Yes   Weight  blue bands  blue bands  blue bands  blue bands  blue bands   Reps  10-15  10-15  10-15  10-15  10-15     Bike   Level  2 scifit  3 scifit  3 scifit  4 scitfit  - scitfit   Minutes  17  17  17  17   -     NuStep   Level  -  3  3  4  4    Minutes  -  1.9  1.8  1.7  1.9     Track   Laps  16  15  16  6   -   Minutes  17  17  17  17   -  Exercise Comments:   Exercise Goals and Review: Exercise Goals    Row Name 05/09/17 1045             Exercise Goals   Increase Physical Activity  Yes       Intervention  Provide advice, education, support and counseling about physical activity/exercise needs.;Develop an individualized exercise prescription for aerobic and resistive training based on initial evaluation findings, risk stratification, comorbidities and participant's personal goals.       Expected Outcomes  Achievement of increased cardiorespiratory fitness and enhanced flexibility, muscular endurance and strength shown through measurements of functional capacity and personal statement of participant.       Increase Strength and Stamina  Yes       Intervention  Provide advice, education, support and counseling about physical activity/exercise needs.;Develop an individualized exercise prescription for aerobic and resistive training based on initial evaluation findings, risk stratification, comorbidities and participant's personal goals.       Expected Outcomes  Achievement of increased  cardiorespiratory fitness and enhanced flexibility, muscular endurance and strength shown through measurements of functional capacity and personal statement of participant.          Exercise Goals Re-Evaluation : Exercise Goals Re-Evaluation    Row Name 06/02/17 1156 07/07/17 0710 07/31/17 1637         Exercise Goal Re-Evaluation   Exercise Goals Review  Increase Strength and Stamina;Increase Physical Activity;Able to understand and use Dyspnea scale;Able to understand and use rate of perceived exertion (RPE) scale;Knowledge and understanding of Target Heart Rate Range (THRR);Understanding of Exercise Prescription  Increase Strength and Stamina;Increase Physical Activity;Able to understand and use Dyspnea scale;Able to understand and use rate of perceived exertion (RPE) scale;Knowledge and understanding of Target Heart Rate Range (THRR);Understanding of Exercise Prescription  Increase Strength and Stamina;Increase Physical Activity;Able to understand and use Dyspnea scale;Able to understand and use rate of perceived exertion (RPE) scale;Knowledge and understanding of Target Heart Rate Range (THRR);Understanding of Exercise Prescription     Comments  Patient has only attended two exercise sessions. Will cont. to monitor and progress as able.   Patient has had leave of absense due to son being in the hospital. Will cont. to monitor and progress as able.   Patient has had leave of absense due to son being in the hospital. Will cont. to monitor and progress as able.      Expected Outcomes  Through exercise and education at rehab and at home, patient will increase strength and stamina. Patient will also gain a better understanding of the need for physical activity on a daily basis and the effects it can have on quality of life.  Through exercise and education at rehab and at home, patient will increase strength and stamina. Patient will also gain a better understanding of the need for physical activity on a  daily basis and the effects it can have on quality of life.  Through exercise at rehab and at home, patient will increase strength and stamina making ADL's easier to perform. Patient will also have a better understanding of safe exercise and what they are capable to do outside of clinical supervision.        Discharge Exercise Prescription (Final Exercise Prescription Changes): Exercise Prescription Changes - 07/08/17 1200      Response to Exercise   Blood Pressure (Admit)  124/68    Blood Pressure (Exercise)  136/70    Blood Pressure (Exit)  140/84    Heart Rate (Admit)  89 bpm  Heart Rate (Exercise)  100 bpm    Heart Rate (Exit)  88 bpm    Oxygen Saturation (Admit)  96 %    Oxygen Saturation (Exercise)  96 %    Oxygen Saturation (Exit)  94 %    Rating of Perceived Exertion (Exercise)  9    Perceived Dyspnea (Exercise)  0    Duration  Progress to 45 minutes of aerobic exercise without signs/symptoms of physical distress    Intensity  THRR unchanged      Resistance Training   Training Prescription  Yes    Weight  blue bands    Reps  10-15      Bike   Level  -- scitfit      NuStep   Level  4    Minutes  1.9       Nutrition:  Target Goals: Understanding of nutrition guidelines, daily intake of sodium 1500mg , cholesterol 200mg , calories 30% from fat and 7% or less from saturated fats, daily to have 5 or more servings of fruits and vegetables.  Biometrics:    Nutrition Therapy Plan and Nutrition Goals:   Nutrition Discharge: Rate Your Plate Scores:   Nutrition Goals Re-Evaluation:   Nutrition Goals Discharge (Final Nutrition Goals Re-Evaluation):   Psychosocial: Target Goals: Acknowledge presence or absence of significant depression and/or stress, maximize coping skills, provide positive support system. Participant is able to verbalize types and ability to use techniques and skills needed for reducing stress and depression.  Initial Review & Psychosocial  Screening: Initial Psych Review & Screening - 05/09/17 1119      Initial Review   Current issues with  Current Depression;History of Depression;Current Stress Concerns    Source of Stress Concerns  Family    Comments  son's illness and loss of husband unexpectedly      Family Dynamics   Good Support System?  Yes    Concerns  Recent loss of significant other      Barriers   Psychosocial barriers to participate in program  There are no identifiable barriers or psychosocial needs. although patient has significant psychosocial issues currently she does not feel they will cause barriers to her participation in pulmonary rehab      Screening Interventions   Interventions  Encouraged to exercise;Provide feedback about the scores to participant       Quality of Life Scores:   PHQ-9: Recent Review Flowsheet Data    Depression screen [facility] 2/9 05/09/2017 01/06/2017 12/02/2016 11/24/2015 11/21/2014   Decreased Interest 2 1 0  1 0   Down, Depressed, Hopeless 1 1 0 1 1   PHQ - 2 Score 3 2 0 2 1   Altered sleeping 1 0 0 2 -   Tired, decreased energy 2 0 0 2 -   Change in appetite 3 0 0 2 -   Feeling bad or failure about yourself  3 0 0 1 -   Trouble concentrating 3 0 0 1 -   Moving slowly or fidgety/restless 0 0 0 0 -   Suicidal thoughts 0 0 0 0 -   PHQ-9 Score 15 2 0 10 -   Difficult doing work/chores Somewhat difficult - - Not difficult at all -     Interpretation of Total Score  Total Score Depression Severity:  1-4 = Minimal depression, 5-9 = Mild depression, 10-14 = Moderate depression, 15-19 = Moderately severe depression, 20-27 = Severe depression   Psychosocial Evaluation and Intervention: Psychosocial Evaluation - 05/09/17 1123  Psychosocial Evaluation & Interventions   Interventions  Encouraged to exercise with the program and follow exercise prescription    Expected Outcomes  psychococial issues that patient currently dealing with will not become barriers to participation      Continue Psychosocial Services   Follow up required by staff       Psychosocial Re-Evaluation: Psychosocial Re-Evaluation    [place] Name 06/06/17 1206 07/06/17 2157 07/28/17 1619         Psychosocial Re-Evaluation   Current issues with  Current Depression;History of Depression;Current Stress Concerns  Current Depression;History of Depression;Current Stress Concerns  Current Depression;History of Depression;Current Stress Concerns     Comments  patient appears to enjoy pulmonary rehab. she smiles and interacts with her classmates.  patient appears to enjoy pulmonary rehab. she smiles and interacts with her classmates however when asked how things are with her son she begins to cry. she continues to see several therapist in a group and private setting.  patient continues to struggle with the sudden loss of her husband and the stress of having terminally ill adult son who has been denied lung transplant related to his addictions. she is activel seeing a grief therapist and a family councelor on a weekly basis.     Expected Outcomes  patient will remain free from psychosocial barriers to participation  patient will remain free from psychosocial barriers to participation  patient will remain free from psychosocial barriers to participation     Interventions  - currently in counceling  Encouraged to attend Pulmonary Rehabilitation for the exercise continue with current therapist and physician treatment for depression  Encouraged to attend Pulmonary Rehabilitation for the exercise encourage patient to continue to seek outside counseling and compliant with appointments     Continue Psychosocial Services   Follow up required by staff  Follow up required by staff  Follow up required by staff     Comments  son's illness and loss of husband unexpectedly  son's illness and loss of husband unexpectedly  -       Initial Review   Source of Stress Concerns  Family  Family  Family        Psychosocial Discharge  (Final Psychosocial Re-Evaluation): Psychosocial Re-Evaluation - 07/28/17 1619      Psychosocial Re-Evaluation   Current issues with  Current Depression;History of Depression;Current Stress Concerns    Comments  patient continues to struggle with the sudden loss of her husband and the stress of having terminally ill adult son who has been denied lung transplant related to his addictions. she is activel seeing a grief therapist and a family councelor on a weekly basis.    Expected Outcomes  patient will remain free from psychosocial barriers to participation    Interventions  Encouraged to attend Pulmonary Rehabilitation for the exercise encourage patient to continue to seek outside counseling and compliant with appointments    Continue Psychosocial Services   Follow up required by staff      Initial Review   Source of Stress Concerns  Family       Education: Education Goals: Education classes will be provided on a weekly basis, covering required topics. Participant will state understanding/return demonstration of topics presented.  Learning Barriers/Preferences: Learning Barriers/Preferences - 05/09/17 1116      Learning Barriers/Preferences   Learning Barriers  None    Learning Preferences  Written Material       Education Topics: Risk Factor Reduction:  -Group instruction that is supported by a PowerPoint  presentation. Instructor discusses the definition of a risk factor, different risk factors for pulmonary disease, and how the heart and lungs work together.     Nutrition for Pulmonary Patient:  -Group instruction provided by PowerPoint slides, verbal discussion, and written materials to support subject matter. The instructor gives an explanation and review of healthy diet recommendations, which includes a discussion on weight management, recommendations for fruit and vegetable consumption, as well as protein, fluid, caffeine, fiber, sodium, sugar, and alcohol. Tips for eating  when patients are short of breath are discussed.   Pursed Lip Breathing:  -Group instruction that is supported by demonstration and informational handouts. Instructor discusses the benefits of pursed lip and diaphragmatic breathing and detailed demonstration on how to preform both.     Oxygen Safety:  -Group instruction provided by PowerPoint, verbal discussion, and written material to support subject matter. There is an overview of "What is Oxygen" and "Why do we need it".  Instructor also reviews how to create a safe environment for oxygen use, the importance of using oxygen as prescribed, and the risks of noncompliance. There is a brief discussion on traveling with oxygen and resources the patient may utilize.   Oxygen Equipment:  -Group instruction provided by [facility] Staff utilizing handouts, written materials, and equipment demonstrations.   Signs and Symptoms:  -Group instruction provided by written material and verbal discussion to support subject matter. Warning signs and symptoms of infection, stroke, and heart attack are reviewed and when to call the physician/911 reinforced. Tips for preventing the spread of infection discussed.   Advanced Directives:  -Group instruction provided by verbal instruction and written material to support subject matter. Instructor reviews Advanced Directive laws and proper instruction for filling out document.   Pulmonary Video:  -Group video education that reviews the importance of medication and oxygen compliance, exercise, good nutrition, pulmonary hygiene, and pursed lip and diaphragmatic breathing for the pulmonary patient.   Exercise for the Pulmonary Patient:  -Group instruction that is supported by a PowerPoint presentation. Instructor discusses benefits of exercise, core components of exercise, frequency, duration, and intensity of an exercise routine, importance of utilizing pulse oximetry during exercise, safety while exercising, and  options of places to exercise outside of rehab.     PULMONARY REHAB OTHER RESPIRATORY from 05/27/2017 in [facility]  Date  05/27/17  Educator  ep  Instruction Review Code  2- meets goals/outcomes      Pulmonary Medications:  -Verbally interactive group education provided by instructor with focus on inhaled medications and proper administration.   Anatomy and Physiology of the Respiratory System and Intimacy:  -Group instruction provided by PowerPoint, verbal discussion, and written material to support subject matter. Instructor reviews respiratory cycle and anatomical components of the respiratory system and their functions. Instructor also reviews differences in obstructive and restrictive respiratory diseases with examples of each. Intimacy, Sex, and Sexuality differences are reviewed with a discussion on how relationships can change when diagnosed with pulmonary disease. Common sexual concerns are reviewed.   MD DAY -A group question and answer session with a medical doctor that allows participants to ask questions that relate to their pulmonary disease state.   OTHER EDUCATION -Group or individual verbal, written, or video instructions that support the educational goals of the pulmonary rehab program.   Knowledge Questionnaire Score:   Core Components/Risk Factors/Patient Goals at Admission: Personal Goals and Risk Factors at Admission - 05/09/17 1116      Core Components/Risk Factors/Patient Goals  on Admission    Weight Management  Yes;Obesity    Intervention  Weight Management: Develop a combined nutrition and exercise program designed to reach desired caloric intake, while maintaining appropriate intake of nutrient and fiber, sodium and fats, and appropriate energy expenditure required for the weight goal.;Weight Management/Obesity: Establish reasonable short term and long term weight goals.;Obesity: Provide education and appropriate resources to  help participant work on and attain dietary goals.    Expected Outcomes  Short Term: Continue to assess and modify interventions until short term weight is achieved;Long Term: Adherence to nutrition and physical activity/exercise program aimed toward attainment of established weight goal;Weight Loss: Understanding of general recommendations for a balanced deficit meal plan, which promotes 1-2 lb weight loss per week and includes a negative energy balance of [phone] kcal/d;Understanding recommendations for meals to include 15-35% energy as protein, 25-35% energy from fat, 35-60% energy from carbohydrates, less than 200mg  of dietary cholesterol, 20-35 gm of total fiber daily;Understanding of distribution of calorie intake throughout the day with the consumption of 4-5 meals/snacks    Improve shortness of breath with ADL's  Yes    Intervention  Provide education, individualized exercise plan and daily activity instruction to help decrease symptoms of SOB with activities of daily living.    Expected Outcomes  Short Term: Achieves a reduction of symptoms when performing activities of daily living.    Develop more efficient breathing techniques such as purse lipped breathing and diaphragmatic breathing; and practicing self-pacing with activity  Yes    Intervention  Provide education, demonstration and support about specific breathing techniuqes utilized for more efficient breathing. Include techniques such as pursed lipped breathing, diaphragmatic breathing and self-pacing activity.    Expected Outcomes  Short Term: Participant will be able to demonstrate and use breathing techniques as needed throughout daily activities.       Core Components/Risk Factors/Patient Goals Review:  Goals and Risk Factor Review    Row Name 06/06/17 1205 07/06/17 2154 07/28/17 1617         Core Components/Risk Factors/Patient Goals Review   Personal Goals Review  Weight Management/Obesity;Improve shortness of breath with  ADL's;Develop more efficient breathing techniques such as purse lipped breathing and diaphragmatic breathing and practicing self-pacing with activity.  Weight Management/Obesity;Improve shortness of breath with ADL's;Develop more efficient breathing techniques such as purse lipped breathing and diaphragmatic breathing and practicing self-pacing with activity.  Weight Management/Obesity;Improve shortness of breath with ADL's;Develop more efficient breathing techniques such as purse lipped breathing and diaphragmatic breathing and practicing self-pacing with activity.     Review  patient has attended only 2 sessions since admission and too early to evaluate progress towards goals  patient has only attended 4 sessions since admission. She has several barriers to participation. her son has been denied a lung transplant related to alcohol and drug addictions. he is end stage CF and 63 years old. She also is experiencing what she describes as palpatations during exertion and at rest but this cannont be seen on the cardiac monitor. She is not making any progress towards goals related to her inconsistant attendance.  patient is currently on hold from pulmonary rehab. She was experiencing palpatations and has been referred to a cardiologist for workup 08/01/17. She had not progressed towards her pulmonary goals at last session. will re-evaluate 30 days after patient released from medical hold     Expected Outcomes  see admission outcomes  see admission outcomes  see admission outcomes        Core  Components/Risk Factors/Patient Goals at Discharge (Final Review):  Goals and Risk Factor Review - 07/28/17 1617      Core Components/Risk Factors/Patient Goals Review   Personal Goals Review  Weight Management/Obesity;Improve shortness of breath with ADL's;Develop more efficient breathing techniques such as purse lipped breathing and diaphragmatic breathing and practicing self-pacing with activity.    Review  patient is  currently on hold from pulmonary rehab. She was experiencing palpatations and has been referred to a cardiologist for workup 08/01/17. She had not progressed towards her pulmonary goals at last session. will re-evaluate 30 days after patient released from medical hold    Expected Outcomes  see admission outcomes       ITP Comments:   Comments: Unable to evaluate progress towards goals related to patient on medial hold.

## 2017-08-01 ENCOUNTER — Ambulatory Visit (INDEPENDENT_AMBULATORY_CARE_PROVIDER_SITE_OTHER): Payer: BLUE CROSS/BLUE SHIELD | Admitting: Cardiovascular Disease

## 2017-08-01 ENCOUNTER — Encounter: Payer: Self-pay | Admitting: Cardiovascular Disease

## 2017-08-01 VITALS — BP 131/83 | HR 92 | Ht 64.0 in | Wt 252.0 lb

## 2017-08-01 DIAGNOSIS — R0789 Other chest pain: Secondary | ICD-10-CM

## 2017-08-01 DIAGNOSIS — E7800 Pure hypercholesterolemia, unspecified: Secondary | ICD-10-CM

## 2017-08-01 DIAGNOSIS — R0602 Shortness of breath: Secondary | ICD-10-CM | POA: Diagnosis not present

## 2017-08-01 DIAGNOSIS — R002 Palpitations: Secondary | ICD-10-CM

## 2017-08-01 NOTE — Patient Instructions (Addendum)
Medication Instructions:  Your physician recommends that you continue on your current medications as directed. Please refer to the Current Medication list given to you today.  Labwork: none  Testing/Procedures: Your physician has requested that you have en exercise stress myoview. For further information please visit [email or web address]. Please follow instruction sheet, as given.  Follow-Up: Your physician recommends that you schedule a follow-up appointment in: 2 month ov

## 2017-08-01 NOTE — Progress Notes (Signed)
Cardiology Office Note   Date:  08/03/2017   ID:  Stacey Alvarez, DOB Jan 24, 1956, MRN [id number]  PCP:  Midge Minium, MD  Cardiologist:   Skeet Latch, MD   Chief Complaint  Patient presents with  . New Patient (Initial Visit)    pt c/o pounding heart     History of Present Illness: Stacey Alvarez is a 61 y.o. female with hypertension, hyperlipidemia, hypertriglyceridemia  ILD, prior DVT/PE, morbid obesity, and OSA who is being seen today for the evaluation of palpitations at the request of Midge Minium, MD.  Stacey Alvarez reported heart pounding to Dr. Birdie Riddle on 07/11/17.  The episode occurred during pulmonary rehab where Stacey Alvarez vital signs were found to be within normal limits.  Stacey Alvarez has been participating in pulmonary rehab twice per week for the last 2 months.  Stacey Alvarez feels like Stacey Alvarez heart is pounding very hard when exercising and even after Stacey Alvarez quits.  Stacey Alvarez had a 1-lead EKG that reportedly showed normal sinus rhythm.  Stacey Alvarez notes that Stacey Alvarez oxygen saturations and heart rate are always within normal limits but Stacey Alvarez feels poorly.  It is associated with lightheadedness and dizziness but no syncope.  Stacey Alvarez denies associated chest pain but does feel short of breath.  In the past Stacey Alvarez is consumed a lot of caffeine but has been trying to cut back lately.  Stacey Alvarez typically has less than 1 caffeinated beverage daily and has been drinking more water.Stacey Alvarez does sometimes use Mucinex DM.  Stacey Alvarez denies lower extremity edema, orthopnea, or PND.  Stacey Alvarez has sleep apnea and uses Stacey Alvarez CPAP faithfully.  Stacey Alvarez also notes that when Stacey Alvarez is working around Stacey Alvarez home.  It is been ongoing since at least the summer.  Stacey Alvarez had a pulmonary embolism 09/2016.  Echo at that time revealed LVEF 65-70% with grade 1 diastolic dysfunction and mild aortic regurgitation.  Since being diagnosed with ILD Stacey Alvarez has been on prednisone.  Stacey Alvarez reports a 15 pound weight gain in the last 8 months.  Stacey Alvarez husband of 42 years passed away 2017-03-26.  Stacey Alvarez has   struggled with this transition.  He had cancer and he finished his first round of chemotherapy.  His prognosis was reportedly good but he had a heart attack.  Stacey Alvarez also struggles with Stacey Alvarez son who has cystic fibrosis.  He has not been taking care of himself lately and struggles with drug and alcohol abuse.  His lung function has dropped to 30% and he is not a candidate for transplant.  Stacey Alvarez has a good relationship with Stacey Alvarez daughter who lives in [place].  Stacey Alvarez reports that Stacey Alvarez has several friends and family members who have been supportive.  Stacey Alvarez also participates in counseling.   Past Medical History:  Diagnosis Date  . Allergy   . Anxiety   . Arthritis    KNEES,BACK,HIPS  . Depression   . Fibromyalgia   . GERD (gastroesophageal reflux disease)   . Hyperlipidemia   . Hypertension   . IBS (irritable bowel syndrome)   . Interstitial lung disease ([place])   . Sleep apnea     Past Surgical History:  Procedure Laterality Date  . CHOLECYSTECTOMY    . COLONOSCOPY    . PARTIAL HYSTERECTOMY  1999  . POLYPECTOMY       Current Outpatient Medications  Medication Sig Dispense Refill  . ALPRAZolam (XANAX) 1 MG tablet 0.5 mg in the am and 2mg  at night    . amLODipine (NORVASC) 5 MG tablet  TAKE 1 TABLET(5 MG) BY MOUTH AT BEDTIME 90 tablet 0  . azithromycin (ZITHROMAX) 250 MG tablet TK 1 T PO QD  6  . butalbital-aspirin-caffeine-codeine (FIORINAL WITH CODEINE) 50-325-40-30 MG capsule TAKE 1 CAPSULE BY MOUTH EVERY 6 HOURS AS NEEDED FOR PAIN 30 capsule 0  . cetirizine (ZYRTEC) 10 MG tablet Take 10 mg by mouth daily.    . cyclobenzaprine (FLEXERIL) 10 MG tablet Take 1 tablet (10 mg total) by mouth 3 (three) times daily as needed for muscle spasms. 90 tablet 0  . fenofibrate 160 MG tablet TAKE 1 TABLET(160 MG) BY MOUTH DAILY 90 tablet 0  . FLUoxetine (PROZAC) 20 MG tablet Take 40 mg by mouth daily.    Marland Kitchen gabapentin (NEURONTIN) 600 MG tablet TK 1 TO 2 TS PO QHS  4  . lamoTRIgine (LAMICTAL) 100 MG tablet  Take 100 mg by mouth daily.    Marland Kitchen lisinopril (PRINIVIL,ZESTRIL) 20 MG tablet TAKE 1 TABLET(20 MG) BY MOUTH TWICE DAILY 180 tablet 0  . meclizine (ANTIVERT) 25 MG tablet Take 1 tablet (25 mg total) by mouth 3 (three) times daily as needed for dizziness. 45 tablet 0  . mycophenolate (CELLCEPT) 500 MG tablet Take 1,000 mg by mouth 2 (two) times daily.     . OXYGEN Inhale 2 L into the lungs as needed (SOB).    . pantoprazole (PROTONIX) 40 MG tablet TAKE ONE TABLET BY MOUTH DAILY 30 tablet 6  . pravastatin (PRAVACHOL) 20 MG tablet TAKE 1 TABLET(20 MG) BY MOUTH DAILY 90 tablet 0  . predniSONE (DELTASONE) 10 MG tablet Take 5 mg by mouth daily.  11  . promethazine (PHENERGAN) 25 MG tablet Take 1 tablet (25 mg total) by mouth every 8 (eight) hours as needed for nausea or vomiting. 45 tablet 1  . rivaroxaban (XARELTO) 20 MG TABS tablet Take 1 tablet (20 mg total) by mouth daily with supper. 30 tablet 5  . temazepam (RESTORIL) 30 MG capsule Take 30 mg by mouth at bedtime.     No current facility-administered medications for this visit.     Allergies:   Prednisone; Macrobid [nitrofurantoin monohyd macro]; and Sulfa antibiotics    Social History:  The patient  reports that Stacey Alvarez has quit smoking. Stacey Alvarez has never used smokeless tobacco. Stacey Alvarez reports that Stacey Alvarez does not drink alcohol or use drugs.   Family History:  The patient's family history includes Alcohol abuse in Stacey Alvarez son; Anxiety disorder in Stacey Alvarez father and mother; Breast cancer in Stacey Alvarez maternal aunt; Cystic fibrosis in Stacey Alvarez son; Depression in Stacey Alvarez father and mother; Diabetes in Stacey Alvarez father, mother, and paternal grandmother; Heart disease in Stacey Alvarez father and mother; Kidney disease in Stacey Alvarez mother.    ROS:  Please see the history of present illness.   Otherwise, review of systems are positive for none.   All other systems are reviewed and negative.    PHYSICAL EXAM: VS:  BP 131/83 (BP Location: Left Arm)   Pulse 92   Ht 5\' 4"  (1.626 m)   Wt 114.3 kg (252 lb)    BMI 43.26 kg/m  , BMI Body mass index is 43.26 kg/m. GENERAL:  Well appearing.  Tearful at times.  HEENT:  Pupils equal round and reactive, fundi not visualized, oral mucosa unremarkable NECK:  No jugular venous distention, waveform within normal limits, carotid upstroke brisk and symmetric, no bruits, no thyromegaly LUNGS:  Clear to auscultation bilaterally HEART:  RRR.  PMI not displaced or sustained,S1 and S2 within normal limits, no S3,  no S4, no clicks, no rubs, no murmurs ABD:  Flat, positive bowel sounds normal in frequency in pitch, no bruits, no rebound, no guarding, no midline pulsatile mass, no hepatomegaly, no splenomegaly EXT:  2 plus pulses throughout, no edema, no cyanosis no clubbing SKIN:  No rashes no nodules NEURO:  Cranial nerves II through XII grossly intact, motor grossly intact throughout PSYCH:  Cognitively intact, oriented to person place and time   EKG:  EKG is ordered today. The ekg ordered today demonstrates sinus rhythm.  Rate 91 bpm.  LAFB.  LVH.  Poor R wave progression.  Echo 10/07/16: Study Conclusions  - Left ventricle: The cavity size was normal. There was mild   concentric hypertrophy. Systolic function was vigorous. The   estimated ejection fraction was in the range of 65% to 70%. Wall   motion was normal; there were no regional wall motion   abnormalities. Doppler parameters are consistent with abnormal   left ventricular relaxation (grade 1 diastolic dysfunction).   Doppler parameters are consistent with elevated ventricular   end-diastolic filling pressure. - Aortic valve: There was mild regurgitation. - Left atrium: The atrium was normal in size. - Right ventricle: The cavity size was normal. Wall thickness was   normal. Systolic function was normal. - Tricuspid valve: There was mild regurgitation. - Pulmonary arteries: Systolic pressure was within the normal   range. - Inferior vena cava: The vessel was normal in size. - Pericardium,  extracardiac: There was no pericardial effusion.   Recent Labs: 10/07/2016: B Natriuretic Peptide 24.8 07/11/2017: ALT 15; BUN 11; Creatinine, Ser 0.65; Hemoglobin 12.3; Platelets 329.0; Potassium 4.0; Sodium 137; TSH 0.48    Lipid Panel    Component Value Date/Time   CHOL 208 (H) 07/11/2017 1121   TRIG 153.0 (H) 07/11/2017 1121   HDL 54.50 07/11/2017 1121   CHOLHDL 4 07/11/2017 1121   VLDL 30.6 07/11/2017 1121   LDLCALC 123 (H) 07/11/2017 1121   LDLDIRECT 144.0 06/12/2016 1112      Wt Readings from Last 3 Encounters:  08/01/17 114.3 kg (252 lb)  07/11/17 114.3 kg (252 lb)  07/08/17 112.8 kg (248 lb 10.9 oz)      ASSESSMENT AND PLAN:  # Exertional palpitations: Symptoms are atypical and Stacey Alvarez heart rhythm and oxygen saturation have been unremarkable during these episodes.  Therefore we will not get an ambulatory monitor.  We will get an exercise Myoview to assess for ischemia. Stacey Alvarez labs have been unremarkable.  Consider a cardioselective beta blocker.    # Hypertension:  Blood pressure very mildly elevated.  Continue amlodipine, and lisinopril.  Consider beta blocker as above.   # Hyperlipidemia: Continue rosuvastatin.  LDL 123.  Dose may need adjusting based on ischemia work up.  # Prior PE/DVT: On Xarelto.    Current medicines are reviewed at length with the patient today.  The patient does not have concerns regarding medicines.  The following changes have been made:  no change  Labs/ tests ordered today include:   Orders Placed This Encounter  Procedures  . MYOCARDIAL PERFUSION IMAGING  . EKG 12-Lead     Disposition:   FU with Corri Delapaz C. Oval Linsey, MD, [facility] in 2 months.     This note was written with the assistance of speech recognition software.  Please excuse any transcriptional errors.  Signed, Terrika Zuver C. Oval Linsey, MD, [facility]  08/03/2017 9:16 AM    [place]

## 2017-08-03 ENCOUNTER — Encounter: Payer: Self-pay | Admitting: Cardiovascular Disease

## 2017-08-04 DIAGNOSIS — G4733 Obstructive sleep apnea (adult) (pediatric): Secondary | ICD-10-CM | POA: Diagnosis not present

## 2017-08-05 ENCOUNTER — Encounter (HOSPITAL_COMMUNITY)
Admission: RE | Admit: 2017-08-05 | Discharge: 2017-08-05 | Disposition: A | Discharge status: Home / Self Care | Payer: BLUE CROSS/BLUE SHIELD | Source: Ambulatory Visit | Attending: Pulmonary Disease | Admitting: Pulmonary Disease

## 2017-08-05 DIAGNOSIS — J849 Interstitial pulmonary disease, unspecified: Secondary | ICD-10-CM

## 2017-08-06 ENCOUNTER — Telehealth (HOSPITAL_COMMUNITY): Payer: Self-pay

## 2017-08-06 NOTE — Telephone Encounter (Signed)
To return call for questions or concerns.

## 2017-08-11 ENCOUNTER — Ambulatory Visit
Admission: RE | Admit: 2017-08-11 | Discharge: 2017-08-11 | Disposition: A | Discharge status: Home / Self Care | Payer: BC Managed Care – PPO

## 2017-08-11 DIAGNOSIS — Z6841 Body Mass Index (BMI) 40.0 and over, adult: Secondary | ICD-10-CM | POA: Diagnosis not present

## 2017-08-11 DIAGNOSIS — G4733 Obstructive sleep apnea (adult) (pediatric): Secondary | ICD-10-CM | POA: Diagnosis not present

## 2017-08-11 DIAGNOSIS — Z7952 Long term (current) use of systemic steroids: Secondary | ICD-10-CM | POA: Diagnosis not present

## 2017-08-11 DIAGNOSIS — I2510 Atherosclerotic heart disease of native coronary artery without angina pectoris: Secondary | ICD-10-CM | POA: Diagnosis not present

## 2017-08-11 DIAGNOSIS — Z86711 Personal history of pulmonary embolism: Secondary | ICD-10-CM | POA: Diagnosis not present

## 2017-08-11 DIAGNOSIS — Z7901 Long term (current) use of anticoagulants: Secondary | ICD-10-CM | POA: Diagnosis not present

## 2017-08-11 DIAGNOSIS — Z8489 Family history of other specified conditions: Secondary | ICD-10-CM | POA: Diagnosis not present

## 2017-08-11 DIAGNOSIS — M797 Fibromyalgia: Secondary | ICD-10-CM | POA: Diagnosis not present

## 2017-08-11 DIAGNOSIS — R0602 Shortness of breath: Secondary | ICD-10-CM | POA: Diagnosis not present

## 2017-08-11 DIAGNOSIS — J329 Chronic sinusitis, unspecified: Secondary | ICD-10-CM | POA: Diagnosis not present

## 2017-08-11 DIAGNOSIS — Z86718 Personal history of other venous thrombosis and embolism: Secondary | ICD-10-CM | POA: Diagnosis not present

## 2017-08-11 DIAGNOSIS — J849 Interstitial pulmonary disease, unspecified: Secondary | ICD-10-CM | POA: Diagnosis not present

## 2017-08-11 NOTE — Unmapped (Signed)
Refering Physician: Dr Kalman Shan, [facility]    CHief Complaint: ILD complicaitons    HPI   61yo women who presented with back pain. She had a CT abdomen done to look for kidney stones and it showed basilar marking. She than fell and had a rotator cuff tear and had an Xray that showed the basilar marking too. She was still asymptomatic until she had surgery on her sholder. 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 She was stared on xeralto and had slowly gotten better. She has no xtrapulm symptoms.  - she lives in a new house  - no pets/birds  - cough is like tickle in back of throat... Did not respond to azithro  - does have heart burn sympotms    ROS   - the balance of 10 systems is negative other than noted above     PMH   - ILD  - PE, 09/2016    --> bilateral  - OSA    --> CPAP  - CAD  - Fibromyalgia  - Depression    FH   - son with CF    SH   - non-smoker  - no birds/pets  - new house    MEDS (personally reviewed in EPIC, pertinent meds noted below)     PHYSICAL EXAM   Vitals - BP 167/85  - Pulse 98  - Temp 36.8 ??C (98.3 ??F) (Oral)  - Resp 18  - Ht 164 cm (5' 4.57)  - Wt (!) 113.3 kg (249 lb 11.2 oz)  - SpO2 96%  - BMI 42.11 kg/m??   Gen - awake, alert, in NAD   Derm - no rash   HEENT - no adenopathy, TMs clear   Vascular - good pulses throughout, no JVP   CV - RRR,no murmers or gallops   Pulm - faint crackles at the bases  Abd - soft, NT, ND, +BS, no hepatosplenomegally   Ext - no edema, no clubbing   Joints - no enlarged joints, no warmth, redness or effusions   Neuro - CN grossly intact, nl gait     RESULTS     Labs (reviewd in EPIC, pertinent values noted below)       PFTs (personally reveiewed and interpreted)     Date: FVC (% Pred) FEV1 (% Pred) FEF25-75(% Pred) DLCO TBBx/Results                                    08/11/17  1.91  1.57    63%     03/24/17  2.03  1.62  1.97  67%     12/06/16  1.86 (57%)  1.52 (60%)  1.86 (83%) 70%        6 Minute Walk   - walked 326m  - lowest sat was 93% on RA    HRCT Chest (03/2016, OSH, personally reviewed and interpretted)   - faint interstitial markings  - gas trapping  - resolved GG nodule (seen 2 weeks prior on CT abdomen)    HRCT Chest (09/2016, personally reviewed and interpretted)   - worsening bibasilar streaky densitites  - gas trapping    CTA Chest (09/2016, personally reviewed and interpretted)   - bilateral sub-massive PE with signs of RH strain    A/P   61yo with ILD and PE  ILD  - 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 Less likely NSIP  - PFTs with moderate restriction and mildly reduced DLCO... PFTs improved on cellcept orginially but lower today... Will try increasing prednison to 10mg  QD and if feels better, will bump cellcept to 1500mg  PO BID  - walk with mild but not clinically significant hypoxia  - MCTD w/u negative but full panel not sent  - pH/Mano with no reflux but esophogeal issues... Will rfer to GI  - Needs to work on weight loss and exercise... This will help the most  - will continue cellcept 1000mg  PO BID but low threshold to increase to 1500mg  PO BID  - Prednsione 5mg  QD but will bump to 10 to see if help sinus/cough/sob symptoms    Sinusitis/PND  - advised neti pott twice daily  - no azithro as did not help  - will bump prednisone to 10mg  QD from 5

## 2017-08-11 NOTE — Unmapped (Signed)
.  follow up as instructed

## 2017-08-12 ENCOUNTER — Encounter (HOSPITAL_COMMUNITY): Payer: BLUE CROSS/BLUE SHIELD

## 2017-08-12 ENCOUNTER — Ambulatory Visit (HOSPITAL_COMMUNITY)
Admission: RE | Admit: 2017-08-12 | Discharge: 2017-08-12 | Disposition: A | Discharge status: Home / Self Care | Payer: BLUE CROSS/BLUE SHIELD | Source: Ambulatory Visit | Attending: Cardiology | Admitting: Cardiology

## 2017-08-12 DIAGNOSIS — R0602 Shortness of breath: Secondary | ICD-10-CM | POA: Insufficient documentation

## 2017-08-12 DIAGNOSIS — R0789 Other chest pain: Secondary | ICD-10-CM | POA: Insufficient documentation

## 2017-08-12 MED ORDER — TECHNETIUM TC 99M TETROFOSMIN IV KIT
28.0000 | PACK | Freq: Once | INTRAVENOUS | Status: AC | PRN
  Administered 2017-08-12: 28 via INTRAVENOUS
  Filled 2017-08-12: qty 28

## 2017-08-12 MED ORDER — AMINOPHYLLINE 25 MG/ML IV SOLN
75.0000 mg | Freq: Once | INTRAVENOUS | Status: AC
  Administered 2017-08-12: 75 mg via INTRAVENOUS

## 2017-08-12 MED ORDER — REGADENOSON 0.4 MG/5ML IV SOLN
0.4000 mg | Freq: Once | INTRAVENOUS | Status: AC
  Administered 2017-08-12: 0.4 mg via INTRAVENOUS

## 2017-08-13 ENCOUNTER — Ambulatory Visit (HOSPITAL_COMMUNITY)
Admission: RE | Admit: 2017-08-13 | Discharge: 2017-08-13 | Disposition: A | Discharge status: Home / Self Care | Payer: BLUE CROSS/BLUE SHIELD | Source: Ambulatory Visit | Attending: Cardiology | Admitting: Cardiology

## 2017-08-13 LAB — MYOCARDIAL PERFUSION IMAGING
CHL CUP NUCLEAR SSS: 2
CHL CUP RESTING HR STRESS: 85 {beats}/min
CSEPPHR: 96 {beats}/min
LV sys vol: 30 mL
LVDIAVOL: 92 mL (ref 46–106)
NUC STRESS TID: 0.92
SDS: 2
SRS: 0

## 2017-08-13 MED ORDER — TECHNETIUM TC 99M TETROFOSMIN IV KIT
28.3000 | PACK | Freq: Once | INTRAVENOUS | Status: AC | PRN
  Administered 2017-08-13: 28.3 via INTRAVENOUS

## 2017-08-14 ENCOUNTER — Encounter: Payer: Self-pay | Admitting: Cardiovascular Disease

## 2017-08-14 ENCOUNTER — Encounter (HOSPITAL_COMMUNITY): Payer: BLUE CROSS/BLUE SHIELD

## 2017-08-14 NOTE — Unmapped (Signed)
[facility] Refill Coordination Note  Specialty Medication(s): MYCOPHENOLATE  Additional Medications shipped: NO    [name], DOB: [birth year]  Phone: [phone] (home) , Alternate phone contact: N/A  Phone or address changes today?: No  All above HIPAA information was verified with patient.  Shipping Address: [address]  [place] [address]   Insurance changes? No    Completed refill call assessment today to schedule patient's medication shipment from the [facility] [phone]).      Confirmed the medication and dosage are correct and have not changed: Yes, regimen is correct and unchanged.    Confirmed patient started or stopped the following medications in the past month:  No, there are no changes reported at this time.    Are you tolerating your medication?:  Tayten reports tolerating the medication.    ADHERENCE    Did you miss any doses in the past 4 weeks? No missed doses reported.    FINANCIAL/SHIPPING    Delivery Scheduled: Yes, Expected medication delivery date: 08/19/17     [name] did not have any additional questions at this time.    Delivery address validated in [facility] scheduling system: Yes, address listed in [facility] is correct.    We will follow up with patient monthly for standard refill processing and delivery.      Thank you,  [name]   [facility] Specialty Pharmacist

## 2017-08-17 MED FILL — MYCOPHENOLATE MOFETIL/500MG/TABS: MYCOPHENOLATE MOFETIL/500MG/TABS | 30 days supply | Qty: 120 | Fill #1

## 2017-08-19 ENCOUNTER — Encounter (HOSPITAL_COMMUNITY)
Admission: RE | Admit: 2017-08-19 | Discharge: 2017-08-19 | Disposition: A | Discharge status: Home / Self Care | Payer: BLUE CROSS/BLUE SHIELD | Source: Ambulatory Visit | Attending: Pulmonary Disease | Admitting: Pulmonary Disease

## 2017-08-19 VITALS — Wt 253.5 lb

## 2017-08-19 DIAGNOSIS — J849 Interstitial pulmonary disease, unspecified: Secondary | ICD-10-CM | POA: Diagnosis not present

## 2017-08-19 NOTE — Progress Notes (Signed)
Daily Session Note  Patient Details  Name: Stacey Alvarez MRN: [id number] Date of Birth: November 10, 1955 Referring Provider:     Pulmonary Rehab Walk Test from 05/20/2017 in [place]  Referring Provider  Dr. Nelda Marseille      Encounter Date: 08/19/2017  Check In: Session Check In - 08/19/17 1216      Check-In   Location  [facility]    Staff Present  Rosebud Poles, RN, BSN;Rafiel Mecca, MS, ACSM RCEP, Exercise Physiologist;Portia Rollene Rotunda, RN, Roque Cash, RN    Supervising physician immediately available to respond to emergencies  [facility] immediately available    Physician(s)  Dr. Nevada Crane    Medication changes reported      No    Fall or balance concerns reported     No    Tobacco Cessation  No Change    Warm-up and Cool-down  Performed as group-led instruction    Resistance Training Performed  Yes    VAD Patient?  No      Pain Assessment   Currently in Pain?  No/denies    Multiple Pain Sites  No       Capillary Blood Glucose: No results found for this or any previous visit (from the past 24 hour(s)).  Exercise Prescription Changes - 08/19/17 1200      Response to Exercise   Blood Pressure (Admit)  110/64    Blood Pressure (Exercise)  148/68    Blood Pressure (Exit)  118/70    Heart Rate (Admit)  95 bpm    Heart Rate (Exercise)  112 bpm    Heart Rate (Exit)  95 bpm    Oxygen Saturation (Admit)  97 %    Oxygen Saturation (Exercise)  93 %    Oxygen Saturation (Exit)  95 %    Rating of Perceived Exertion (Exercise)  13    Perceived Dyspnea (Exercise)  3    Duration  Progress to 45 minutes of aerobic exercise without signs/symptoms of physical distress    Intensity  THRR unchanged      Resistance Training   Training Prescription  Yes    Weight  blue bands    Reps  10-15      Bike   Level  4 scitfit    Minutes  17      NuStep   Level  4    Minutes  2.2      Track   Laps  11    Minutes  17       Social  History   Tobacco Use  Smoking Status Former Smoker  Smokeless Tobacco Never Used  Tobacco Comment   smoked 2 years in late teens about 8-10 cigs daily.     Goals Met:  Exercise tolerated well No report of cardiac concerns or symptoms Strength training completed today  Goals Unmet:  Not Applicable  Comments: Service time is from 10:30a to 12:05p     Dr. Rush Farmer is Medical Director for Pulmonary Rehab at [facility].

## 2017-08-21 ENCOUNTER — Encounter (HOSPITAL_COMMUNITY)
Admission: RE | Admit: 2017-08-21 | Discharge: 2017-08-21 | Disposition: A | Discharge status: Home / Self Care | Payer: BLUE CROSS/BLUE SHIELD | Source: Ambulatory Visit | Attending: Pulmonary Disease | Admitting: Pulmonary Disease

## 2017-08-21 ENCOUNTER — Other Ambulatory Visit: Payer: Self-pay | Admitting: Family Medicine

## 2017-08-21 VITALS — Wt 254.2 lb

## 2017-08-21 DIAGNOSIS — J849 Interstitial pulmonary disease, unspecified: Secondary | ICD-10-CM

## 2017-08-21 MED ORDER — CYCLOBENZAPRINE HCL 10 MG PO TABS
10.0000 mg | ORAL_TABLET | Freq: Three times a day (TID) | ORAL | 0 refills | Status: DC | PRN
Start: 2017-08-21 — End: 2017-10-22

## 2017-08-21 NOTE — Progress Notes (Signed)
Daily Session Note  Patient Details  Name: Stacey Alvarez MRN: [id number] Date of Birth: 18-Sep-1955 Referring Provider:     Pulmonary Rehab Walk Test from 05/20/2017 in [place]  Referring Provider  Dr. Nelda Marseille      Encounter Date: 08/21/2017  Check In: Session Check In - 08/21/17 1030      Check-In   Location  [facility]    Staff Present  Rosebud Poles, RN, BSN;Molly diVincenzo, MS, ACSM RCEP, Exercise Physiologist;Ifrah Vest Rollene Rotunda, RN, Roque Cash, RN    Supervising physician immediately available to respond to emergencies  [facility] immediately available    Physician(s)  Dr. Starla Link    Medication changes reported      No    Fall or balance concerns reported     No    Tobacco Cessation  No Change    Warm-up and Cool-down  Performed as group-led instruction    Resistance Training Performed  Yes    VAD Patient?  No      Pain Assessment   Currently in Pain?  No/denies    Multiple Pain Sites  No       Capillary Blood Glucose: No results found for this or any previous visit (from the past 24 hour(s)).  Exercise Prescription Changes - 08/21/17 1248      Response to Exercise   Blood Pressure (Admit)  134/62    Blood Pressure (Exercise)  150/70    Blood Pressure (Exit)  120/48    Heart Rate (Admit)  93 bpm    Heart Rate (Exercise)  109 bpm    Heart Rate (Exit)  91 bpm    Oxygen Saturation (Admit)  97 %    Oxygen Saturation (Exercise)  90 %    Oxygen Saturation (Exit)  96 %    Rating of Perceived Exertion (Exercise)  9    Perceived Dyspnea (Exercise)  1    Duration  Progress to 45 minutes of aerobic exercise without signs/symptoms of physical distress    Intensity  THRR unchanged      Resistance Training   Training Prescription  Yes    Weight  blue bands    Reps  10-15      NuStep   Level  4    Minutes  2.2      Track   Laps  11    Minutes  17       Social History   Tobacco Use  Smoking Status Former  Smoker  Smokeless Tobacco Never Used  Tobacco Comment   smoked 2 years in late teens about 8-10 cigs daily.     Goals Met:  Using PLB without cueing & demonstrates good technique Exercise tolerated well No report of cardiac concerns or symptoms Strength training completed today  Goals Unmet:  Not Applicable  Comments: Service time is from 1030 to 1230   Dr. Rush Farmer is Medical Director for Pulmonary Rehab at [facility].

## 2017-08-21 NOTE — Progress Notes (Signed)
Pulmonary Individual Treatment Plan  Patient Details  Name: Stacey Alvarez MRN: [id number] Date of Birth: 03/15/1956 Referring Provider:     Pulmonary Rehab Walk Test from 05/20/2017 in [facility]  Referring Provider  Dr. Nelda Marseille      Initial Encounter Date:    Pulmonary Rehab Walk Test from 05/20/2017 in [facility]  Date  05/20/17  Referring Provider  Dr. Nelda Marseille      Visit Diagnosis: ILD (interstitial lung disease) ([place])  Patient's Home Medications on Admission:   Current Outpatient Medications:  .  ALPRAZolam (XANAX) 1 MG tablet, 0.5 mg in the am and 5m at night, Disp: , Rfl:  .  amLODipine (NORVASC) 5 MG tablet, TAKE 1 TABLET(5 MG) BY MOUTH AT BEDTIME, Disp: 90 tablet, Rfl: 0 .  azithromycin (ZITHROMAX) 250 MG tablet, TK 1 T PO QD, Disp: , Rfl: 6 .  butalbital-aspirin-caffeine-codeine (FIORINAL WITH CODEINE) 50-325-40-30 MG capsule, TAKE 1 CAPSULE BY MOUTH EVERY 6 HOURS AS NEEDED FOR PAIN, Disp: 30 capsule, Rfl: 0 .  cetirizine (ZYRTEC) 10 MG tablet, Take 10 mg by mouth daily., Disp: , Rfl:  .  cyclobenzaprine (FLEXERIL) 10 MG tablet, Take 1 tablet (10 mg total) by mouth 3 (three) times daily as needed for muscle spasms., Disp: 90 tablet, Rfl: 0 .  fenofibrate 160 MG tablet, TAKE 1 TABLET(160 MG) BY MOUTH DAILY, Disp: 90 tablet, Rfl: 0 .  FLUoxetine (PROZAC) 20 MG tablet, Take 40 mg by mouth daily., Disp: , Rfl:  .  gabapentin (NEURONTIN) 600 MG tablet, TK 1 TO 2 TS PO QHS, Disp: , Rfl: 4 .  lamoTRIgine (LAMICTAL) 100 MG tablet, Take 100 mg by mouth daily., Disp: , Rfl:  .  lisinopril (PRINIVIL,ZESTRIL) 20 MG tablet, TAKE 1 TABLET(20 MG) BY MOUTH TWICE DAILY, Disp: 180 tablet, Rfl: 0 .  meclizine (ANTIVERT) 25 MG tablet, Take 1 tablet (25 mg total) by mouth 3 (three) times daily as needed for dizziness., Disp: 45 tablet, Rfl: 0 .  mycophenolate (CELLCEPT) 500 MG tablet, Take 1,000 mg by mouth 2 (two) times daily. , Disp: ,  Rfl:  .  OXYGEN, Inhale 2 L into the lungs as needed (SOB)., Disp: , Rfl:  .  pantoprazole (PROTONIX) 40 MG tablet, TAKE ONE TABLET BY MOUTH DAILY, Disp: 30 tablet, Rfl: 6 .  pravastatin (PRAVACHOL) 20 MG tablet, TAKE 1 TABLET(20 MG) BY MOUTH DAILY, Disp: 90 tablet, Rfl: 0 .  predniSONE (DELTASONE) 10 MG tablet, Take 5 mg by mouth daily., Disp: , Rfl: 11 .  promethazine (PHENERGAN) 25 MG tablet, Take 1 tablet (25 mg total) by mouth every 8 (eight) hours as needed for nausea or vomiting., Disp: 45 tablet, Rfl: 1 .  rivaroxaban (XARELTO) 20 MG TABS tablet, Take 1 tablet (20 mg total) by mouth daily with supper., Disp: 30 tablet, Rfl: 5 .  temazepam (RESTORIL) 30 MG capsule, Take 30 mg by mouth at bedtime., Disp: , Rfl:   Past Medical History: Past Medical History:  Diagnosis Date  . Allergy   . Anxiety   . Arthritis    KNEES,BACK,HIPS  . Depression   . Fibromyalgia   . GERD (gastroesophageal reflux disease)   . Hyperlipidemia   . Hypertension   . IBS (irritable bowel syndrome)   . [place]   . Sleep apnea     Tobacco Use: Social History   Tobacco Use  Smoking Status Former Smoker  Smokeless Tobacco Never Used  Tobacco  Comment   smoked 2 years in late teens about 8-10 cigs daily.     Labs: Recent Review Flowsheet Data    Labs for ITP Cardiac and Pulmonary Rehab Latest Ref Rng & Units 06/12/2016 10/07/2016 10/07/2016 01/06/2017 07/11/2017   Cholestrol 0 - 200 mg/dL 241(H) - - 225(H) 208(H)   LDLCALC 0 - 99 mg/dL - - - 125(H) 123(H)   LDLDIRECT mg/dL 144.0 - - - -   HDL >39.00 mg/dL 52.50 - - 64.30 54.50   Trlycerides 0.0 - 149.0 mg/dL 277.0(H) - - 178.0(H) 153.0(H)   Hemoglobin A1c 4.6 - 6.5 % - - - - 5.5   PHART 7.350 - 7.450 - - 7.416 - -   PCO2ART 32.0 - 48.0 mmHg - - 38.9 - -   HCO3 20.0 - 28.0 mmol/L - 24.3 24.9 - -   TCO2 0 - 100 mmol/L - 25 26 - -   O2SAT % - 94.0 97.0 - -      Capillary Blood Glucose: No results found for:  GLUCAP   Pulmonary Assessment Scores:   Pulmonary Function Assessment:   Exercise Target Goals:    Exercise Program Goal: Individual exercise prescription set with THRR, safety & activity barriers. Participant demonstrates ability to understand and report RPE using BORG scale, to self-measure pulse accurately, and to acknowledge the importance of the exercise prescription.  Exercise Prescription Goal: Starting with aerobic activity 30 plus minutes a day, 3 days per week for initial exercise prescription. Provide home exercise prescription and guidelines that participant acknowledges understanding prior to discharge.  Activity Barriers & Risk Stratification: Activity Barriers & Cardiac Risk Stratification - 05/09/17 1044      Activity Barriers & Cardiac Risk Stratification   Activity Barriers  Deconditioning;Shortness of Breath;Arthritis       6 Minute Walk: 6 Minute Walk    Row Name 05/20/17 1632         6 Minute Walk   Phase  Initial     Distance  1610 feet     Walk Time  6 minutes     # of Rest Breaks  0     MPH  3.04     METS  3.3     RPE  13     Perceived Dyspnea   3     Symptoms  No     Resting HR  102 bpm     Resting BP  135/84     Resting Oxygen Saturation   97 %     Exercise Oxygen Saturation  during 6 min walk  90 %     Max Ex. HR  120 bpm     Max Ex. BP  142/72       Interval HR   1 Minute HR  102     2 Minute HR  116     3 Minute HR  119     4 Minute HR  119     5 Minute HR  119     6 Minute HR  120     2 Minute Post HR  113     Interval Heart Rate?  Yes       Interval Oxygen   Interval Oxygen?  Yes     Baseline Oxygen Saturation %  97 %     1 Minute Oxygen Saturation %  96 %     1 Minute Liters of Oxygen  0 L     2 Minute Oxygen Saturation %  91 %     2 Minute Liters of Oxygen  0 L     3 Minute Oxygen Saturation %  91 %     3 Minute Liters of Oxygen  0 L     4 Minute Oxygen Saturation %  91 %     4 Minute Liters of Oxygen  0 L     5  Minute Oxygen Saturation %  90 %     5 Minute Liters of Oxygen  0 L     6 Minute Oxygen Saturation %  90 %     6 Minute Liters of Oxygen  0 L     2 Minute Post Oxygen Saturation %  92 %     2 Minute Post Liters of Oxygen  0 L        Oxygen Initial Assessment: Oxygen Initial Assessment - 05/20/17 1640      Initial 6 min Walk   Oxygen Used  None      Program Oxygen Prescription   Program Oxygen Prescription  None       Oxygen Re-Evaluation: Oxygen Re-Evaluation    Row Name 06/06/17 1204 07/06/17 2153 07/28/17 1615 08/19/17 1244       Program Oxygen Prescription   Program Oxygen Prescription  None  None  None  None      Home Oxygen   Home Oxygen Device  Home Concentrator;E-Tanks  Home Concentrator;E-Tanks  Home Concentrator;E-Tanks  Home Concentrator;E-Tanks    Sleep Oxygen Prescription  CPAP  CPAP  CPAP  CPAP    Home Exercise Oxygen Prescription  None  None  None  None    Liters per minute  -  -  0  0    Home at Rest Exercise Oxygen Prescription  None  None  None  None    Compliance with Home Oxygen Use  -  -  Yes  Yes compliane with CPAP      Goals/Expected Outcomes   Comments  patient has been able to ween off oxygen since placed on it during hospitalization  patient continue to remain off oxygen during pulmonary rehab and saturation are staying in the 90s with maximum exertion  patient continue to remain off oxygen during pulmonary rehab and saturation are staying in the 90s with maximum exertion  patient continue to remain off oxygen during pulmonary rehab and saturation are staying in the 90s with maximum exertion    Goals/Expected Outcomes  -  see admission outcomes  -  see admission outcomes       Oxygen Discharge (Final Oxygen Re-Evaluation): Oxygen Re-Evaluation - 08/19/17 1244      Program Oxygen Prescription   Program Oxygen Prescription  None      Home Oxygen   Home Oxygen Device  Home Concentrator;E-Tanks    Sleep Oxygen Prescription  CPAP    Home  Exercise Oxygen Prescription  None    Liters per minute  0    Home at Rest Exercise Oxygen Prescription  None    Compliance with Home Oxygen Use  Yes compliane with CPAP      Goals/Expected Outcomes   Comments  patient continue to remain off oxygen during pulmonary rehab and saturation are staying in the 90s with maximum exertion    Goals/Expected Outcomes  see admission outcomes       Initial Exercise Prescription: Initial Exercise Prescription - 05/20/17 1600      Date of Initial Exercise RX and Referring Provider   Date  05/20/17    Referring Provider  Dr. Kasandra Knudsen   Level  0.5    Minutes  17      NuStep   Level  2    Minutes  17      Track   Laps  10    Minutes  17      Prescription Details   Frequency (times per week)  2    Duration  Progress to 45 minutes of aerobic exercise without signs/symptoms of physical distress      Intensity   THRR 40-80% of Max Heartrate  63-126    Ratings of Perceived Exertion  11-13    Perceived Dyspnea  0-4      Progression   Progression  Continue progressive overload as per policy without signs/symptoms or physical distress.      Resistance Training   Training Prescription  Yes    Weight  blue bands    Reps  10-15       Perform Capillary Blood Glucose checks as needed.  Exercise Prescription Changes: Exercise Prescription Changes    Row Name 05/27/17 1200 05/29/17 1202 06/17/17 1200 07/01/17 1200 07/08/17 1200     Response to Exercise   Blood Pressure (Admit)  128/80  134/70  132/62  120/68  124/68   Blood Pressure (Exercise)  154/76  140/80  140/70  124/60  136/70   Blood Pressure (Exit)  122/70  134/60  122/68  118/62  140/84   Heart Rate (Admit)  85 bpm  88 bpm  90 bpm  96 bpm  89 bpm   Heart Rate (Exercise)  114 bpm  114 bpm  114 bpm  115 bpm  100 bpm   Heart Rate (Exit)  95 bpm  98 bpm  92 bpm  93 bpm  88 bpm   Oxygen Saturation (Admit)  98 %  97 %  99 %  96 %  96 %   Oxygen Saturation (Exercise)  91 %  91  %  89 %  95 %  96 %   Oxygen Saturation (Exit)  97 %  96 %  97 %  97 %  94 %   Rating of Perceived Exertion (Exercise)  13  12  13  15  9    Perceived Dyspnea (Exercise)  3  3  2  3   0   Duration  Progress to 45 minutes of aerobic exercise without signs/symptoms of physical distress  Progress to 45 minutes of aerobic exercise without signs/symptoms of physical distress  Progress to 45 minutes of aerobic exercise without signs/symptoms of physical distress  Progress to 45 minutes of aerobic exercise without signs/symptoms of physical distress  Progress to 45 minutes of aerobic exercise without signs/symptoms of physical distress   Intensity  THRR unchanged  THRR unchanged  THRR unchanged  THRR unchanged  THRR unchanged     Resistance Training   Training Prescription  Yes  Yes  Yes  Yes  Yes   Weight  blue bands  blue bands  blue bands  blue bands  blue bands   Reps  10-15  10-15  10-15  10-15  10-15     Bike   Level  2 scifit  3 scifit  3 scifit  4 scitfit  - scitfit   Minutes  17  17  17  17   -     NuStep   Level  -  3  3  4  4   Minutes  -  1.9  1.8  1.7  1.9     Track   Laps  16  15  16  6   -   Minutes  17  17  17  17   -   Row Name 08/19/17 1200             Response to Exercise   Blood Pressure (Admit)  110/64       Blood Pressure (Exercise)  148/68       Blood Pressure (Exit)  118/70       Heart Rate (Admit)  95 bpm       Heart Rate (Exercise)  112 bpm       Heart Rate (Exit)  95 bpm       Oxygen Saturation (Admit)  97 %       Oxygen Saturation (Exercise)  93 %       Oxygen Saturation (Exit)  95 %       Rating of Perceived Exertion (Exercise)  13       Perceived Dyspnea (Exercise)  3       Duration  Progress to 45 minutes of aerobic exercise without signs/symptoms of physical distress       Intensity  THRR unchanged         Resistance Training   Training Prescription  Yes       Weight  blue bands       Reps  10-15         Bike   Level  4 scitfit       Minutes  17          NuStep   Level  4       Minutes  2.2         Track   Laps  11       Minutes  17          Exercise Comments:   Exercise Goals and Review: Exercise Goals    Row Name 05/09/17 1045             Exercise Goals   Increase Physical Activity  Yes       Intervention  Provide advice, education, support and counseling about physical activity/exercise needs.;Develop an individualized exercise prescription for aerobic and resistive training based on initial evaluation findings, risk stratification, comorbidities and participant's personal goals.       Expected Outcomes  Achievement of increased cardiorespiratory fitness and enhanced flexibility, muscular endurance and strength shown through measurements of functional capacity and personal statement of participant.       Increase Strength and Stamina  Yes       Intervention  Provide advice, education, support and counseling about physical activity/exercise needs.;Develop an individualized exercise prescription for aerobic and resistive training based on initial evaluation findings, risk stratification, comorbidities and participant's personal goals.       Expected Outcomes  Achievement of increased cardiorespiratory fitness and enhanced flexibility, muscular endurance and strength shown through measurements of functional capacity and personal statement of participant.          Exercise Goals Re-Evaluation : Exercise Goals Re-Evaluation    Row Name 06/02/17 1156 07/07/17 0710 07/31/17 1637 08/18/17 1630       Exercise Goal Re-Evaluation   Exercise Goals Review  Increase Strength and Stamina;Increase Physical Activity;Able to understand and use Dyspnea scale;Able to understand and use rate of perceived exertion (RPE) scale;Knowledge and understanding of Target Heart Rate  Range (THRR);Understanding of Exercise Prescription  Increase Strength and Stamina;Increase Physical Activity;Able to understand and use Dyspnea scale;Able to  understand and use rate of perceived exertion (RPE) scale;Knowledge and understanding of Target Heart Rate Range (THRR);Understanding of Exercise Prescription  Increase Strength and Stamina;Increase Physical Activity;Able to understand and use Dyspnea scale;Able to understand and use rate of perceived exertion (RPE) scale;Knowledge and understanding of Target Heart Rate Range (THRR);Understanding of Exercise Prescription  Increase Strength and Stamina;Increase Physical Activity;Able to understand and use Dyspnea scale;Able to understand and use rate of perceived exertion (RPE) scale;Knowledge and understanding of Target Heart Rate Range (THRR);Understanding of Exercise Prescription    Comments  Patient has only attended two exercise sessions. Will cont. to monitor and progress as able.   Patient has had leave of absense due to son being in the hospital. Will cont. to monitor and progress as able.   Patient has had leave of absense due to son being in the hospital. Will cont. to monitor and progress as able.   Patient has been on medical leave. Will return to program tomorrow. Will cont. to monitor and progress as able.     Expected Outcomes  Through exercise and education at rehab and at home, patient will increase strength and stamina. Patient will also gain a better understanding of the need for physical activity on a daily basis and the effects it can have on quality of life.  Through exercise and education at rehab and at home, patient will increase strength and stamina. Patient will also gain a better understanding of the need for physical activity on a daily basis and the effects it can have on quality of life.  Through exercise at rehab and at home, patient will increase strength and stamina making ADL's easier to perform. Patient will also have a better understanding of safe exercise and what they are capable to do outside of clinical supervision.  Through exercise at rehab and at home, patient will  increase strength and stamina making ADL's easier to perform. Patient will also have a better understanding of safe exercise and what they are capable to do outside of clinical supervision.       Discharge Exercise Prescription (Final Exercise Prescription Changes): Exercise Prescription Changes - 08/19/17 1200      Response to Exercise   Blood Pressure (Admit)  110/64    Blood Pressure (Exercise)  148/68    Blood Pressure (Exit)  118/70    Heart Rate (Admit)  95 bpm    Heart Rate (Exercise)  112 bpm    Heart Rate (Exit)  95 bpm    Oxygen Saturation (Admit)  97 %    Oxygen Saturation (Exercise)  93 %    Oxygen Saturation (Exit)  95 %    Rating of Perceived Exertion (Exercise)  13    Perceived Dyspnea (Exercise)  3    Duration  Progress to 45 minutes of aerobic exercise without signs/symptoms of physical distress    Intensity  THRR unchanged      Resistance Training   Training Prescription  Yes    Weight  blue bands    Reps  10-15      Bike   Level  4 scitfit    Minutes  17      NuStep   Level  4    Minutes  2.2      Track   Laps  11    Minutes  17       Nutrition:  Target Goals: Understanding of nutrition guidelines, daily intake of sodium 1500mg , cholesterol 200mg , calories 30% from fat and 7% or less from saturated fats, daily to have 5 or more servings of fruits and vegetables.  Biometrics:    Nutrition Therapy Plan and Nutrition Goals:   Nutrition Discharge: Rate Your Plate Scores:   Nutrition Goals Re-Evaluation:   Nutrition Goals Discharge (Final Nutrition Goals Re-Evaluation):   Psychosocial: Target Goals: Acknowledge presence or absence of significant depression and/or stress, maximize coping skills, provide positive support system. Participant is able to verbalize types and ability to use techniques and skills needed for reducing stress and depression.  Initial Review & Psychosocial Screening: Initial Psych Review & Screening - 05/09/17 1119       Initial Review   Current issues with  Current Depression;History of Depression;Current Stress Concerns    Source of Stress Concerns  Family    Comments  son's illness and loss of husband unexpectedly      Family Dynamics   Good Support System?  Yes    Concerns  Recent loss of significant other      Barriers   Psychosocial barriers to participate in program  There are no identifiable barriers or psychosocial needs. although patient has significant psychosocial issues currently she does not feel they will cause barriers to her participation in pulmonary rehab      Screening Interventions   Interventions  Encouraged to exercise;Provide feedback about the scores to participant       Quality of Life Scores:   PHQ-9: Recent Review Flowsheet Data    Depression screen [facility] 2/9 05/09/2017 01/06/2017 12/02/2016 11/24/2015 11/21/2014   Decreased Interest 2 1 0  1 0   Down, Depressed, Hopeless 1 1 0 1 1   PHQ - 2 Score 3 2 0 2 1   Altered sleeping 1 0 0 2 -   Tired, decreased energy 2 0 0 2 -   Change in appetite 3 0 0 2 -   Feeling bad or failure about yourself  3 0 0 1 -   Trouble concentrating 3 0 0 1 -   Moving slowly or fidgety/restless 0 0 0 0 -   Suicidal thoughts 0 0 0 0 -   PHQ-9 Score 15 2 0 10 -   Difficult doing work/chores Somewhat difficult - - Not difficult at all -     Interpretation of Total Score  Total Score Depression Severity:  1-4 = Minimal depression, 5-9 = Mild depression, 10-14 = Moderate depression, 15-19 = Moderately severe depression, 20-27 = Severe depression   Psychosocial Evaluation and Intervention: Psychosocial Evaluation - 05/09/17 1123      Psychosocial Evaluation & Interventions   Interventions  Encouraged to exercise with the program and follow exercise prescription    Expected Outcomes  psychococial issues that patient currently dealing with will not become barriers to participation    Continue Psychosocial Services   Follow up required by staff        Psychosocial Re-Evaluation: Psychosocial Re-Evaluation    Row Name 06/06/17 1206 07/06/17 2157 07/28/17 1619 08/19/17 1247       Psychosocial Re-Evaluation   Current issues with  Current Depression;History of Depression;Current Stress Concerns  Current Depression;History of Depression;Current Stress Concerns  Current Depression;History of Depression;Current Stress Concerns  Current Depression;History of Depression;Current Stress Concerns    Comments  patient appears to enjoy pulmonary rehab. she smiles and interacts with her classmates.  patient appears to enjoy pulmonary rehab. she smiles and  interacts with her classmates however when asked how things are with her son she begins to cry. she continues to see several therapist in a group and private setting.  patient continues to struggle with the sudden loss of her husband and the stress of having terminally ill adult son who has been denied lung transplant related to his addictions. she is activel seeing a grief therapist and a family councelor on a weekly basis.  patient continues to struggle with the sudden loss of her husband and the stress of having terminally ill adult son who has been denied lung transplant related to his addictions. she is activel seeing a grief therapist and a family councelor on a weekly basis.    Expected Outcomes  patient will remain free from psychosocial barriers to participation  patient will remain free from psychosocial barriers to participation  patient will remain free from psychosocial barriers to participation  patient will remain free from psychosocial barriers to participation    Interventions  - currently in counceling  Encouraged to attend Pulmonary Rehabilitation for the exercise continue with current therapist and physician treatment for depression  Encouraged to attend Pulmonary Rehabilitation for the exercise encourage patient to continue to seek outside counseling and compliant with appointments   Encouraged to attend Pulmonary Rehabilitation for the exercise    Continue Psychosocial Services   Follow up required by staff  Follow up required by staff  Follow up required by staff  -    Comments  son's illness and loss of husband unexpectedly  son's illness and loss of husband unexpectedly  -  son's illness and loss of husband unexpectedly      Initial Review   Source of Stress Concerns  Family  Family  Family  Family       Psychosocial Discharge (Final Psychosocial Re-Evaluation): Psychosocial Re-Evaluation - 08/19/17 1247      Psychosocial Re-Evaluation   Current issues with  Current Depression;History of Depression;Current Stress Concerns    Comments  patient continues to struggle with the sudden loss of her husband and the stress of having terminally ill adult son who has been denied lung transplant related to his addictions. she is activel seeing a grief therapist and a family councelor on a weekly basis.    Expected Outcomes  patient will remain free from psychosocial barriers to participation    Interventions  Encouraged to attend Pulmonary Rehabilitation for the exercise    Comments  son's illness and loss of husband unexpectedly      Initial Review   Source of Stress Concerns  Family       Education: Education Goals: Education classes will be provided on a weekly basis, covering required topics. Participant will state understanding/return demonstration of topics presented.  Learning Barriers/Preferences: Learning Barriers/Preferences - 05/09/17 1116      Learning Barriers/Preferences   Learning Barriers  None    Learning Preferences  Written Material       Education Topics: Risk Factor Reduction:  -Group instruction that is supported by a PowerPoint presentation. Instructor discusses the definition of a risk factor, different risk factors for pulmonary disease, and how the heart and lungs work together.     Nutrition for Pulmonary Patient:  -Group instruction  provided by PowerPoint slides, verbal discussion, and written materials to support subject matter. The instructor gives an explanation and review of healthy diet recommendations, which includes a discussion on weight management, recommendations for fruit and vegetable consumption, as well as protein, fluid, caffeine, fiber, sodium,  sugar, and alcohol. Tips for eating when patients are short of breath are discussed.   Pursed Lip Breathing:  -Group instruction that is supported by demonstration and informational handouts. Instructor discusses the benefits of pursed lip and diaphragmatic breathing and detailed demonstration on how to preform both.     Oxygen Safety:  -Group instruction provided by PowerPoint, verbal discussion, and written material to support subject matter. There is an overview of "What is Oxygen" and "Why do we need it".  Instructor also reviews how to create a safe environment for oxygen use, the importance of using oxygen as prescribed, and the risks of noncompliance. There is a brief discussion on traveling with oxygen and resources the patient may utilize.   Oxygen Equipment:  -Group instruction provided by [facility] : [facility] Staff utilizing handouts, written materials, and equipment demonstrations.   Signs and Symptoms:  -Group instruction provided by written material and verbal discussion to support subject matter. Warning signs and symptoms of infection, stroke, and heart attack are reviewed and when to call the physician/911 reinforced. Tips for preventing the spread of infection discussed.   Advanced Directives:  -Group instruction provided by verbal instruction and written material to support subject matter. Instructor reviews Advanced Directive laws and proper instruction for filling out document.   Pulmonary Video:  -Group video education that reviews the importance of medication and oxygen compliance, exercise, good nutrition, pulmonary hygiene, and pursed lip and  diaphragmatic breathing for the pulmonary patient.   Exercise for the Pulmonary Patient:  -Group instruction that is supported by a PowerPoint presentation. Instructor discusses benefits of exercise, core components of exercise, frequency, duration, and intensity of an exercise routine, importance of utilizing pulse oximetry during exercise, safety while exercising, and options of places to exercise outside of rehab.     PULMONARY REHAB OTHER RESPIRATORY from 05/27/2017 in [place]  Date  05/27/17  Educator  ep  Instruction Review Code  2- meets goals/outcomes      Pulmonary Medications:  -Verbally interactive group education provided by instructor with focus on inhaled medications and proper administration.   Anatomy and Physiology of the Respiratory System and Intimacy:  -Group instruction provided by PowerPoint, verbal discussion, and written material to support subject matter. Instructor reviews respiratory cycle and anatomical components of the respiratory system and their functions. Instructor also reviews differences in obstructive and restrictive respiratory diseases with examples of each. Intimacy, Sex, and Sexuality differences are reviewed with a discussion on how relationships can change when diagnosed with pulmonary disease. Common sexual concerns are reviewed.   MD DAY -A group question and answer session with a medical doctor that allows participants to ask questions that relate to their pulmonary disease state.   OTHER EDUCATION -Group or individual verbal, written, or video instructions that support the educational goals of the pulmonary rehab program.   Knowledge Questionnaire Score:   Core Components/Risk Factors/Patient Goals at Admission: Personal Goals and Risk Factors at Admission - 05/09/17 1116      Core Components/Risk Factors/Patient Goals on Admission    Weight Management  Yes;Obesity    Intervention  Weight Management:  Develop a combined nutrition and exercise program designed to reach desired caloric intake, while maintaining appropriate intake of nutrient and fiber, sodium and fats, and appropriate energy expenditure required for the weight goal.;Weight Management/Obesity: Establish reasonable short term and long term weight goals.;Obesity: Provide education and appropriate resources to help participant work on and attain dietary goals.    Expected Outcomes  Short Term: Continue to assess and modify interventions until short term weight is achieved;Long Term: Adherence to nutrition and physical activity/exercise program aimed toward attainment of established weight goal;Weight Loss: Understanding of general recommendations for a balanced deficit meal plan, which promotes 1-2 lb weight loss per week and includes a negative energy balance of [phone] kcal/d;Understanding recommendations for meals to include 15-35% energy as protein, 25-35% energy from fat, 35-60% energy from carbohydrates, less than 200mg  of dietary cholesterol, 20-35 gm of total fiber daily;Understanding of distribution of calorie intake throughout the day with the consumption of 4-5 meals/snacks    Improve shortness of breath with ADL's  Yes    Intervention  Provide education, individualized exercise plan and daily activity instruction to help decrease symptoms of SOB with activities of daily living.    Expected Outcomes  Short Term: Achieves a reduction of symptoms when performing activities of daily living.    Develop more efficient breathing techniques such as purse lipped breathing and diaphragmatic breathing; and practicing self-pacing with activity  Yes    Intervention  Provide education, demonstration and support about specific breathing techniuqes utilized for more efficient breathing. Include techniques such as pursed lipped breathing, diaphragmatic breathing and self-pacing activity.    Expected Outcomes  Short Term: Participant will be able to  demonstrate and use breathing techniques as needed throughout daily activities.       Core Components/Risk Factors/Patient Goals Review:  Goals and Risk Factor Review    Row Name 06/06/17 1205 07/06/17 2154 07/28/17 1617 08/19/17 1245       Core Components/Risk Factors/Patient Goals Review   Personal Goals Review  Weight Management/Obesity;Improve shortness of breath with ADL's;Develop more efficient breathing techniques such as purse lipped breathing and diaphragmatic breathing and practicing self-pacing with activity.  Weight Management/Obesity;Improve shortness of breath with ADL's;Develop more efficient breathing techniques such as purse lipped breathing and diaphragmatic breathing and practicing self-pacing with activity.  Weight Management/Obesity;Improve shortness of breath with ADL's;Develop more efficient breathing techniques such as purse lipped breathing and diaphragmatic breathing and practicing self-pacing with activity.  Weight Management/Obesity;Improve shortness of breath with ADL's;Develop more efficient breathing techniques such as purse lipped breathing and diaphragmatic breathing and practicing self-pacing with activity.    Review  patient has attended only 2 sessions since admission and too early to evaluate progress towards goals  patient has only attended 4 sessions since admission. She has several barriers to participation. her son has been denied a lung transplant related to alcohol and drug addictions. he is end stage CF and 63 years old. She also is experiencing what she describes as palpatations during exertion and at rest but this cannont be seen on the cardiac monitor. She is not making any progress towards goals related to her inconsistant attendance.  patient is currently on hold from pulmonary rehab. She was experiencing palpatations and has been referred to a cardiologist for workup 08/01/17. She had not progressed towards her pulmonary goals at last session. will  re-evaluate 30 days after patient released from medical hold  today is patients first day back from being on medical hold. She has recieved clearence from her cardiologist to return. She was able to tolerate the same workloads she was on when she left and maintained an RPE between 11 and 13. Will evaluate progress towards goals over the next 30 days    Expected Outcomes  see admission outcomes  see admission outcomes  see admission outcomes  see admission outcomes       Core  Components/Risk Factors/Patient Goals at Discharge (Final Review):  Goals and Risk Factor Review - 08/19/17 1245      Core Components/Risk Factors/Patient Goals Review   Personal Goals Review  Weight Management/Obesity;Improve shortness of breath with ADL's;Develop more efficient breathing techniques such as purse lipped breathing and diaphragmatic breathing and practicing self-pacing with activity.    Review  today is patients first day back from being on medical hold. She has recieved clearence from her cardiologist to return. She was able to tolerate the same workloads she was on when she left and maintained an RPE between 11 and 13. Will evaluate progress towards goals over the next 30 days    Expected Outcomes  see admission outcomes       ITP Comments:   Comments: patient has only attended 6 sessions since admission

## 2017-08-26 ENCOUNTER — Encounter (HOSPITAL_COMMUNITY): Payer: BLUE CROSS/BLUE SHIELD

## 2017-08-28 ENCOUNTER — Telehealth (HOSPITAL_COMMUNITY): Payer: Self-pay | Admitting: Family Medicine

## 2017-08-28 ENCOUNTER — Encounter (HOSPITAL_COMMUNITY): Payer: BLUE CROSS/BLUE SHIELD

## 2017-09-02 ENCOUNTER — Encounter (HOSPITAL_COMMUNITY): Payer: BLUE CROSS/BLUE SHIELD

## 2017-09-03 ENCOUNTER — Other Ambulatory Visit: Payer: Self-pay | Admitting: Family Medicine

## 2017-09-03 ENCOUNTER — Encounter: Payer: Self-pay | Admitting: Family Medicine

## 2017-09-03 DIAGNOSIS — G4733 Obstructive sleep apnea (adult) (pediatric): Secondary | ICD-10-CM | POA: Diagnosis not present

## 2017-09-04 ENCOUNTER — Encounter (HOSPITAL_COMMUNITY)
Admission: RE | Admit: 2017-09-04 | Discharge: 2017-09-04 | Disposition: A | Discharge status: Home / Self Care | Payer: BLUE CROSS/BLUE SHIELD | Source: Ambulatory Visit | Attending: Pulmonary Disease | Admitting: Pulmonary Disease

## 2017-09-11 ENCOUNTER — Other Ambulatory Visit: Payer: Self-pay | Admitting: Family Medicine

## 2017-09-11 ENCOUNTER — Encounter (HOSPITAL_COMMUNITY)
Admission: RE | Admit: 2017-09-11 | Discharge: 2017-09-11 | Disposition: A | Discharge status: Home / Self Care | Payer: BLUE CROSS/BLUE SHIELD | Source: Ambulatory Visit | Attending: Pulmonary Disease | Admitting: Pulmonary Disease

## 2017-09-11 DIAGNOSIS — J849 Interstitial pulmonary disease, unspecified: Secondary | ICD-10-CM

## 2017-09-11 NOTE — Unmapped (Signed)
[facility] Refill Coordination Note  Specialty Medication(s): mycophenolate 500mg   Additional Medications shipped: none    Amber Bush, DOB: Feb 09, 1956  Phone: [phone] (home) , Alternate phone contact: N/A  Phone or address changes today?: No  All above HIPAA information was verified with patient.  Shipping Address: [address]  [place] [address]   Insurance changes? No    Completed refill call assessment today to schedule patient's medication shipment from the [facility] [phone]).      Confirmed the medication and dosage are correct and have not changed: Yes, regimen is correct and unchanged.    Confirmed patient started or stopped the following medications in the past month:  No, there are no changes reported at this time.    Are you tolerating your medication?:  Amber Bush reports tolerating the medication.    ADHERENCE    Did you miss any doses in the past 4 weeks? No missed doses reported.    FINANCIAL/SHIPPING    Delivery Scheduled: Yes, Expected medication delivery date: 09/15/17     Amber Bush did not have any additional questions at this time.    Delivery address validated in [facility] scheduling system: Yes, address listed in [facility] is correct.    We will follow up with patient monthly for standard refill processing and delivery.      Thank you,  [name]   [facility] Specialty Pharmacist

## 2017-09-11 NOTE — Progress Notes (Signed)
Pulmonary Individual Treatment Plan  Patient Details  Name: Stacey Alvarez MRN: [id number] Date of Birth: 03/15/1956 Referring Provider:     Pulmonary Rehab Walk Test from 05/20/2017 in [facility]  Referring Provider  Dr. Nelda Marseille      Initial Encounter Date:    Pulmonary Rehab Walk Test from 05/20/2017 in [facility]  Date  05/20/17  Referring Provider  Dr. Nelda Marseille      Visit Diagnosis: ILD (interstitial lung disease) ([place])  Patient's Home Medications on Admission:   Current Outpatient Medications:  .  ALPRAZolam (XANAX) 1 MG tablet, 0.5 mg in the am and 5m at night, Disp: , Rfl:  .  amLODipine (NORVASC) 5 MG tablet, TAKE 1 TABLET(5 MG) BY MOUTH AT BEDTIME, Disp: 90 tablet, Rfl: 0 .  azithromycin (ZITHROMAX) 250 MG tablet, TK 1 T PO QD, Disp: , Rfl: 6 .  butalbital-aspirin-caffeine-codeine (FIORINAL WITH CODEINE) 50-325-40-30 MG capsule, TAKE 1 CAPSULE BY MOUTH EVERY 6 HOURS AS NEEDED FOR PAIN, Disp: 30 capsule, Rfl: 0 .  cetirizine (ZYRTEC) 10 MG tablet, Take 10 mg by mouth daily., Disp: , Rfl:  .  cyclobenzaprine (FLEXERIL) 10 MG tablet, Take 1 tablet (10 mg total) by mouth 3 (three) times daily as needed for muscle spasms., Disp: 90 tablet, Rfl: 0 .  fenofibrate 160 MG tablet, TAKE 1 TABLET(160 MG) BY MOUTH DAILY, Disp: 90 tablet, Rfl: 0 .  FLUoxetine (PROZAC) 20 MG tablet, Take 40 mg by mouth daily., Disp: , Rfl:  .  gabapentin (NEURONTIN) 600 MG tablet, TK 1 TO 2 TS PO QHS, Disp: , Rfl: 4 .  lamoTRIgine (LAMICTAL) 100 MG tablet, Take 100 mg by mouth daily., Disp: , Rfl:  .  lisinopril (PRINIVIL,ZESTRIL) 20 MG tablet, TAKE 1 TABLET(20 MG) BY MOUTH TWICE DAILY, Disp: 180 tablet, Rfl: 0 .  meclizine (ANTIVERT) 25 MG tablet, Take 1 tablet (25 mg total) by mouth 3 (three) times daily as needed for dizziness., Disp: 45 tablet, Rfl: 0 .  mycophenolate (CELLCEPT) 500 MG tablet, Take 1,000 mg by mouth 2 (two) times daily. , Disp: ,  Rfl:  .  OXYGEN, Inhale 2 L into the lungs as needed (SOB)., Disp: , Rfl:  .  pantoprazole (PROTONIX) 40 MG tablet, TAKE ONE TABLET BY MOUTH DAILY, Disp: 30 tablet, Rfl: 6 .  pravastatin (PRAVACHOL) 20 MG tablet, TAKE 1 TABLET(20 MG) BY MOUTH DAILY, Disp: 90 tablet, Rfl: 0 .  predniSONE (DELTASONE) 10 MG tablet, Take 5 mg by mouth daily., Disp: , Rfl: 11 .  promethazine (PHENERGAN) 25 MG tablet, Take 1 tablet (25 mg total) by mouth every 8 (eight) hours as needed for nausea or vomiting., Disp: 45 tablet, Rfl: 1 .  rivaroxaban (XARELTO) 20 MG TABS tablet, Take 1 tablet (20 mg total) by mouth daily with supper., Disp: 30 tablet, Rfl: 5 .  temazepam (RESTORIL) 30 MG capsule, Take 30 mg by mouth at bedtime., Disp: , Rfl:   Past Medical History: Past Medical History:  Diagnosis Date  . Allergy   . Anxiety   . Arthritis    KNEES,BACK,HIPS  . Depression   . Fibromyalgia   . GERD (gastroesophageal reflux disease)   . Hyperlipidemia   . Hypertension   . IBS (irritable bowel syndrome)   . [place]   . Sleep apnea     Tobacco Use: Social History   Tobacco Use  Smoking Status Former Smoker  Smokeless Tobacco Never Used  Tobacco  Comment   smoked 2 years in late teens about 8-10 cigs daily.     Labs: Recent Review Flowsheet Data    Labs for ITP Cardiac and Pulmonary Rehab Latest Ref Rng & Units 06/12/2016 10/07/2016 10/07/2016 01/06/2017 07/11/2017   Cholestrol 0 - 200 mg/dL 241(H) - - 225(H) 208(H)   LDLCALC 0 - 99 mg/dL - - - 125(H) 123(H)   LDLDIRECT mg/dL 144.0 - - - -   HDL >39.00 mg/dL 52.50 - - 64.30 54.50   Trlycerides 0.0 - 149.0 mg/dL 277.0(H) - - 178.0(H) 153.0(H)   Hemoglobin A1c 4.6 - 6.5 % - - - - 5.5   PHART 7.350 - 7.450 - - 7.416 - -   PCO2ART 32.0 - 48.0 mmHg - - 38.9 - -   HCO3 20.0 - 28.0 mmol/L - 24.3 24.9 - -   TCO2 0 - 100 mmol/L - 25 26 - -   O2SAT % - 94.0 97.0 - -      Capillary Blood Glucose: No results found for:  GLUCAP   Pulmonary Assessment Scores:   Pulmonary Function Assessment:   Exercise Target Goals:    Exercise Program Goal: Individual exercise prescription set with THRR, safety & activity barriers. Participant demonstrates ability to understand and report RPE using BORG scale, to self-measure pulse accurately, and to acknowledge the importance of the exercise prescription.  Exercise Prescription Goal: Starting with aerobic activity 30 plus minutes a day, 3 days per week for initial exercise prescription. Provide home exercise prescription and guidelines that participant acknowledges understanding prior to discharge.  Activity Barriers & Risk Stratification: Activity Barriers & Cardiac Risk Stratification - 05/09/17 1044      Activity Barriers & Cardiac Risk Stratification   Activity Barriers  Deconditioning;Shortness of Breath;Arthritis       6 Minute Walk: 6 Minute Walk    Row Name 05/20/17 1632         6 Minute Walk   Phase  Initial     Distance  1610 feet     Walk Time  6 minutes     # of Rest Breaks  0     MPH  3.04     METS  3.3     RPE  13     Perceived Dyspnea   3     Symptoms  No     Resting HR  102 bpm     Resting BP  135/84     Resting Oxygen Saturation   97 %     Exercise Oxygen Saturation  during 6 min walk  90 %     Max Ex. HR  120 bpm     Max Ex. BP  142/72       Interval HR   1 Minute HR  102     2 Minute HR  116     3 Minute HR  119     4 Minute HR  119     5 Minute HR  119     6 Minute HR  120     2 Minute Post HR  113     Interval Heart Rate?  Yes       Interval Oxygen   Interval Oxygen?  Yes     Baseline Oxygen Saturation %  97 %     1 Minute Oxygen Saturation %  96 %     1 Minute Liters of Oxygen  0 L     2 Minute Oxygen Saturation %  91 %     2 Minute Liters of Oxygen  0 L     3 Minute Oxygen Saturation %  91 %     3 Minute Liters of Oxygen  0 L     4 Minute Oxygen Saturation %  91 %     4 Minute Liters of Oxygen  0 L     5  Minute Oxygen Saturation %  90 %     5 Minute Liters of Oxygen  0 L     6 Minute Oxygen Saturation %  90 %     6 Minute Liters of Oxygen  0 L     2 Minute Post Oxygen Saturation %  92 %     2 Minute Post Liters of Oxygen  0 L        Oxygen Initial Assessment: Oxygen Initial Assessment - 05/20/17 1640      Initial 6 min Walk   Oxygen Used  None      Program Oxygen Prescription   Program Oxygen Prescription  None       Oxygen Re-Evaluation: Oxygen Re-Evaluation    Row Name 06/06/17 1204 07/06/17 2153 07/28/17 1615 08/19/17 1244 09/11/17 0701     Program Oxygen Prescription   Program Oxygen Prescription  None  None  None  None  None     Home Oxygen   Home Oxygen Device  Home Concentrator;E-Tanks  Home Concentrator;E-Tanks  Home Concentrator;E-Tanks  Home Concentrator;E-Tanks  Home Concentrator;E-Tanks   Sleep Oxygen Prescription  CPAP  CPAP  CPAP  CPAP  CPAP   Home Exercise Oxygen Prescription  None  None  None  None  None   Liters per minute  -  -  0  0  0   Home at Rest Exercise Oxygen Prescription  None  None  None  None  None   Compliance with Home Oxygen Use  -  -  Yes  Yes compliane with CPAP  Yes     Goals/Expected Outcomes   Comments  patient has been able to ween off oxygen since placed on it during hospitalization  patient continue to remain off oxygen during pulmonary rehab and saturation are staying in the 90s with maximum exertion  patient continue to remain off oxygen during pulmonary rehab and saturation are staying in the 90s with maximum exertion  patient continue to remain off oxygen during pulmonary rehab and saturation are staying in the 90s with maximum exertion  patient continue to remain off oxygen during pulmonary rehab and saturation are staying in the 90s with maximum exertion   Goals/Expected Outcomes  -  see admission outcomes  -  see admission outcomes  see admission outcomes      Oxygen Discharge (Final Oxygen Re-Evaluation): Oxygen Re-Evaluation -  09/11/17 0701      Program Oxygen Prescription   Program Oxygen Prescription  None      Home Oxygen   Home Oxygen Device  Home Concentrator;E-Tanks    Sleep Oxygen Prescription  CPAP    Home Exercise Oxygen Prescription  None    Liters per minute  0    Home at Rest Exercise Oxygen Prescription  None    Compliance with Home Oxygen Use  Yes      Goals/Expected Outcomes   Comments  patient continue to remain off oxygen during pulmonary rehab and saturation are staying in the 90s with maximum exertion    Goals/Expected Outcomes  see admission outcomes  Initial Exercise Prescription: Initial Exercise Prescription - 05/20/17 1600      Date of Initial Exercise RX and Referring Provider   Date  05/20/17    Referring Provider  Dr. Nelda Marseille      Bike   Level  0.5    Minutes  17      NuStep   Level  2    Minutes  17      Track   Laps  10    Minutes  17      Prescription Details   Frequency (times per week)  2    Duration  Progress to 45 minutes of aerobic exercise without signs/symptoms of physical distress      Intensity   THRR 40-80% of Max Heartrate  63-126    Ratings of Perceived Exertion  11-13    Perceived Dyspnea  0-4      Progression   Progression  Continue progressive overload as per policy without signs/symptoms or physical distress.      Resistance Training   Training Prescription  Yes    Weight  blue bands    Reps  10-15       Perform Capillary Blood Glucose checks as needed.  Exercise Prescription Changes: Exercise Prescription Changes    Row Name 05/27/17 1200 05/29/17 1202 06/17/17 1200 07/01/17 1200 07/08/17 1200     Response to Exercise   Blood Pressure (Admit)  128/80  134/70  132/62  120/68  124/68   Blood Pressure (Exercise)  154/76  140/80  140/70  124/60  136/70   Blood Pressure (Exit)  122/70  134/60  122/68  118/62  140/84   Heart Rate (Admit)  85 bpm  88 bpm  90 bpm  96 bpm  89 bpm   Heart Rate (Exercise)  114 bpm  114 bpm  114 bpm   115 bpm  100 bpm   Heart Rate (Exit)  95 bpm  98 bpm  92 bpm  93 bpm  88 bpm   Oxygen Saturation (Admit)  98 %  97 %  99 %  96 %  96 %   Oxygen Saturation (Exercise)  91 %  91 %  89 %  95 %  96 %   Oxygen Saturation (Exit)  97 %  96 %  97 %  97 %  94 %   Rating of Perceived Exertion (Exercise)  _0 Perceived Dyspnea (Exercise)  _1 0   Duration  Progress to 45 minutes of aerobic exercise without signs/symptoms of physical distress  Progress to 45 minutes of aerobic exercise without signs/symptoms of physical distress  Progress to 45 minutes of aerobic exercise without signs/symptoms of physical distress  Progress to 45 minutes of aerobic exercise without signs/symptoms of physical distress  Progress to 45 minutes of aerobic exercise without signs/symptoms of physical distress   Intensity  THRR unchanged  THRR unchanged  THRR unchanged  THRR unchanged  THRR unchanged     Resistance Training   Training Prescription  Yes  Yes  Yes  Yes  Yes   Weight  blue bands  blue bands  blue bands  blue bands  blue bands   Reps  10-15  10-15  10-15  10-15  10-15     Bike   Level  2 scifit  3 scifit  3 scifit  4 scitfit  - scitfit   Minutes  _0 -     NuStep   Level  -  _1 Minutes  -  1.9  1.8  1.7  1.9     Track   Laps  _2 -   Minutes  _3 -   Row Name 08/19/17 1200 08/21/17 1248           Response to Exercise   Blood Pressure (Admit)  110/64  134/62      Blood Pressure (Exercise)  148/68  150/70      Blood Pressure (Exit)  118/70  120/48      Heart Rate (Admit)  95 bpm  93 bpm      Heart Rate (Exercise)  112 bpm  109 bpm      Heart Rate (Exit)  95 bpm  91 bpm      Oxygen Saturation (Admit)  97 %  97 %      Oxygen Saturation (Exercise)  93 %  90 %      Oxygen Saturation (Exit)  95 %  96 %      Rating of Perceived Exertion (Exercise)  13  9      Perceived Dyspnea (Exercise)  3  1      Duration  Progress to 45 minutes of  aerobic exercise without signs/symptoms of physical distress  Progress to 45 minutes of aerobic exercise without signs/symptoms of physical distress      Intensity  THRR unchanged  THRR unchanged        Resistance Training   Training Prescription  Yes  Yes      Weight  blue bands  blue bands      Reps  10-15  10-15        Bike   Level  4 scitfit  -      Minutes  17  -        NuStep   Level  4  4      Minutes  2.2  2.2        Track   Laps  11  11      Minutes  17  17         Exercise Comments:   Exercise Goals and Review: Exercise Goals    Row Name 05/09/17 1045             Exercise Goals   Increase Physical Activity  Yes       Intervention  Provide advice, education, support and counseling about physical activity/exercise needs.;Develop an individualized exercise prescription for aerobic and resistive training based on initial evaluation findings, risk stratification, comorbidities and participant's personal goals.       Expected Outcomes  Achievement of increased cardiorespiratory fitness and enhanced flexibility, muscular endurance and strength shown through measurements of functional capacity and personal statement of participant.       Increase Strength and Stamina  Yes       Intervention  Provide advice, education, support and counseling about physical activity/exercise needs.;Develop an individualized exercise prescription for aerobic and resistive training based on initial evaluation findings, risk stratification, comorbidities and participant's personal goals.       Expected Outcomes  Achievement of increased cardiorespiratory fitness and enhanced flexibility, muscular endurance and strength shown through measurements of functional capacity and personal statement of participant.  Exercise Goals Re-Evaluation : Exercise Goals Re-Evaluation    Row Name 06/02/17 1156 07/07/17 0710 07/31/17 1637 08/18/17 1630 09/04/17 0741     Exercise Goal Re-Evaluation    Exercise Goals Review  Increase Strength and Stamina;Increase Physical Activity;Able to understand and use Dyspnea scale;Able to understand and use rate of perceived exertion (RPE) scale;Knowledge and understanding of Target Heart Rate Range (THRR);Understanding of Exercise Prescription  Increase Strength and Stamina;Increase Physical Activity;Able to understand and use Dyspnea scale;Able to understand and use rate of perceived exertion (RPE) scale;Knowledge and understanding of Target Heart Rate Range (THRR);Understanding of Exercise Prescription  Increase Strength and Stamina;Increase Physical Activity;Able to understand and use Dyspnea scale;Able to understand and use rate of perceived exertion (RPE) scale;Knowledge and understanding of Target Heart Rate Range (THRR);Understanding of Exercise Prescription  Increase Strength and Stamina;Increase Physical Activity;Able to understand and use Dyspnea scale;Able to understand and use rate of perceived exertion (RPE) scale;Knowledge and understanding of Target Heart Rate Range (THRR);Understanding of Exercise Prescription  Increase Strength and Stamina;Increase Physical Activity;Able to understand and use Dyspnea scale;Able to understand and use rate of perceived exertion (RPE) scale;Knowledge and understanding of Target Heart Rate Range (THRR);Understanding of Exercise Prescription   Comments  Patient has only attended two exercise sessions. Will cont. to monitor and progress as able.   Patient has had leave of absense due to son being in the hospital. Will cont. to monitor and progress as able.   Patient has had leave of absense due to son being in the hospital. Will cont. to monitor and progress as able.   Patient has been on medical leave. Will return to program tomorrow. Will cont. to monitor and progress as able.   Patient has been back for two sessions since medical leave. Will cont. to monitor and progress as able.    Expected Outcomes  Through exercise and  education at rehab and at home, patient will increase strength and stamina. Patient will also gain a better understanding of the need for physical activity on a daily basis and the effects it can have on quality of life.  Through exercise and education at rehab and at home, patient will increase strength and stamina. Patient will also gain a better understanding of the need for physical activity on a daily basis and the effects it can have on quality of life.  Through exercise at rehab and at home, patient will increase strength and stamina making ADL's easier to perform. Patient will also have a better understanding of safe exercise and what they are capable to do outside of clinical supervision.  Through exercise at rehab and at home, patient will increase strength and stamina making ADL's easier to perform. Patient will also have a better understanding of safe exercise and what they are capable to do outside of clinical supervision.  Through exercise at rehab and at home, patient will increase strength and stamina making ADL's easier to perform. Patient will also have a better understanding of safe exercise and what they are capable to do outside of clinical supervision.      Discharge Exercise Prescription (Final Exercise Prescription Changes): Exercise Prescription Changes - 08/21/17 1248      Response to Exercise   Blood Pressure (Admit)  134/62    Blood Pressure (Exercise)  150/70    Blood Pressure (Exit)  120/48    Heart Rate (Admit)  93 bpm    Heart Rate (Exercise)  109 bpm    Heart Rate (Exit)  91 bpm  Oxygen Saturation (Admit)  97 %    Oxygen Saturation (Exercise)  90 %    Oxygen Saturation (Exit)  96 %    Rating of Perceived Exertion (Exercise)  9    Perceived Dyspnea (Exercise)  1    Duration  Progress to 45 minutes of aerobic exercise without signs/symptoms of physical distress    Intensity  THRR unchanged      Resistance Training   Training Prescription  Yes    Weight  blue  bands    Reps  10-15      NuStep   Level  4    Minutes  2.2      Track   Laps  11    Minutes  17       Nutrition:  Target Goals: Understanding of nutrition guidelines, daily intake of sodium <1557m, cholesterol <2041m calories 30% from fat and 7% or less from saturated fats, daily to have 5 or more servings of fruits and vegetables.  Biometrics:    Nutrition Therapy Plan and Nutrition Goals:   Nutrition Discharge: Rate Your Plate Scores:   Nutrition Goals Re-Evaluation:   Nutrition Goals Discharge (Final Nutrition Goals Re-Evaluation):   Psychosocial: Target Goals: Acknowledge presence or absence of significant depression and/or stress, maximize coping skills, provide positive support system. Participant is able to verbalize types and ability to use techniques and skills needed for reducing stress and depression.  Initial Review & Psychosocial Screening: Initial Psych Review & Screening - 05/09/17 1119      Initial Review   Current issues with  Current Depression;History of Depression;Current Stress Concerns    Source of Stress Concerns  Family    Comments  son's illness and loss of husband unexpectedly      Family Dynamics   Good Support System?  Yes    Concerns  Recent loss of significant other      Barriers   Psychosocial barriers to participate in program  There are no identifiable barriers or psychosocial needs. although patient has significant psychosocial issues currently she does not feel they will cause barriers to her participation in pulmonary rehab      Screening Interventions   Interventions  Encouraged to exercise;Provide feedback about the scores to participant       Quality of Life Scores:   PHQ-9: Recent Review Flowsheet Data    [facility] 05[facility]09[facility]2017 01/06/2017 12/02/2016 11/24/2015 11/21/2014   Decreased Interest 2 1 0  1 0   Down, Depressed, Hopeless 1 1 0 1 1   PHQ - 2 Score 3 2 0 2 1   Altered sleeping 1 0 0 2 -   Tired,  decreased energy 2 0 0 2 -   Change in appetite 3 0 0 2 -   Feeling bad or failure about yourself  3 0 0 1 -   Trouble concentrating 3 0 0 1 -   Moving slowly or fidgety/restless 0 0 0 0 -   Suicidal thoughts 0 0 0 0 -   PHQ-9 Score 15 2 0 10 -   Difficult doing work/chores Somewhat difficult - - Not difficult at all -     Interpretation of Total Score  Total Score Depression Severity:  1-4 = Minimal depression, 5-9 = Mild depression, 10-14 = Moderate depression, 15-19 = Moderately severe depression, 20-27 = Severe depression   Psychosocial Evaluation and Intervention: Psychosocial Evaluation - 05/09/17 1123      Psychosocial Evaluation & Interventions   Interventions  Encouraged to exercise with the program and follow exercise prescription    Expected Outcomes  psychococial issues that patient currently dealing with will not become barriers to participation    Continue Psychosocial Services   Follow up required by staff       Psychosocial Re-Evaluation: Psychosocial Re-Evaluation    [place] Name 06/06/17 1206 07/06/17 2157 07/28/17 1619 08/19/17 1247 09/11/17 0703     Psychosocial Re-Evaluation   Current issues with  Current Depression;History of Depression;Current Stress Concerns  Current Depression;History of Depression;Current Stress Concerns  Current Depression;History of Depression;Current Stress Concerns  Current Depression;History of Depression;Current Stress Concerns  Current Depression;History of Depression;Current Stress Concerns   Comments  patient appears to enjoy pulmonary rehab. she smiles and interacts with her classmates.  patient appears to enjoy pulmonary rehab. she smiles and interacts with her classmates however when asked how things are with her son she begins to cry. she continues to see several therapist in a group and private setting.  patient continues to struggle with the sudden loss of her husband and the stress of having terminally ill adult son who has been  denied lung transplant related to his addictions. she is activel seeing a grief therapist and a family councelor on a weekly basis.  patient continues to struggle with the sudden loss of her husband and the stress of having terminally ill adult son who has been denied lung transplant related to his addictions. she is activel seeing a grief therapist and a family councelor on a weekly basis.  patient continues to struggle with the sudden loss of her husband and the stress of having terminally ill adult son who has been denied lung transplant related to his addictions. she is activel seeing a grief therapist and a family councelor on a weekly basis.   Expected Outcomes  patient will remain free from psychosocial barriers to participation  patient will remain free from psychosocial barriers to participation  patient will remain free from psychosocial barriers to participation  patient will remain free from psychosocial barriers to participation  patient will remain free from psychosocial barriers to participation   Interventions  - currently in counceling  Encouraged to attend Pulmonary Rehabilitation for the exercise continue with current therapist and physician treatment for depression  Encouraged to attend Pulmonary Rehabilitation for the exercise encourage patient to continue to seek outside counseling and compliant with appointments  Encouraged to attend Pulmonary Rehabilitation for the exercise  Encouraged to attend Pulmonary Rehabilitation for the exercise   Continue Psychosocial Services   Follow up required by staff  Follow up required by staff  Follow up required by staff  -  Follow up required by staff   Comments  son's illness and loss of husband unexpectedly  son's illness and loss of husband unexpectedly  -  son's illness and loss of husband unexpectedly  son's illness and loss of husband unexpectedly     Initial Review   Source of Stress Concerns  Family  Family  Family  Family  Family       Psychosocial Discharge (Final Psychosocial Re-Evaluation): Psychosocial Re-Evaluation - 09/11/17 0703      Psychosocial Re-Evaluation   Current issues with  Current Depression;History of Depression;Current Stress Concerns    Comments  patient continues to struggle with the sudden loss of her husband and the stress of having terminally ill adult son who has been denied lung transplant related to his addictions. she is activel seeing a grief therapist and a family councelor on  a weekly basis.    Expected Outcomes  patient will remain free from psychosocial barriers to participation    Interventions  Encouraged to attend Pulmonary Rehabilitation for the exercise    Continue Psychosocial Services   Follow up required by staff    Comments  son's illness and loss of husband unexpectedly      Initial Review   Source of Stress Concerns  Family       Education: Education Goals: Education classes will be provided on a weekly basis, covering required topics. Participant will state understanding/return demonstration of topics presented.  Learning Barriers/Preferences: Learning Barriers/Preferences - 05/09/17 1116      Learning Barriers/Preferences   Learning Barriers  None    Learning Preferences  Written Material       Education Topics: Risk Factor Reduction:  -Group instruction that is supported by a PowerPoint presentation. Instructor discusses the definition of a risk factor, different risk factors for pulmonary disease, and how the heart and lungs work together.     Nutrition for Pulmonary Patient:  -Group instruction provided by PowerPoint slides, verbal discussion, and written materials to support subject matter. The instructor gives an explanation and review of healthy diet recommendations, which includes a discussion on weight management, recommendations for fruit and vegetable consumption, as well as protein, fluid, caffeine, fiber, sodium, sugar, and alcohol. Tips for eating  when patients are short of breath are discussed.   Pursed Lip Breathing:  -Group instruction that is supported by demonstration and informational handouts. Instructor discusses the benefits of pursed lip and diaphragmatic breathing and detailed demonstration on how to preform both.     Oxygen Safety:  -Group instruction provided by PowerPoint, verbal discussion, and written material to support subject matter. There is an overview of "What is Oxygen" and "Why do we need it".  Instructor also reviews how to create a safe environment for oxygen use, the importance of using oxygen as prescribed, and the risks of noncompliance. There is a brief discussion on traveling with oxygen and resources the patient may utilize.   Oxygen Equipment:  -Group instruction provided by [facility] Staff utilizing handouts, written materials, and equipment demonstrations.   Signs and Symptoms:  -Group instruction provided by written material and verbal discussion to support subject matter. Warning signs and symptoms of infection, stroke, and heart attack are reviewed and when to call the physician/911 reinforced. Tips for preventing the spread of infection discussed.   Advanced Directives:  -Group instruction provided by verbal instruction and written material to support subject matter. Instructor reviews Advanced Directive laws and proper instruction for filling out document.   Pulmonary Video:  -Group video education that reviews the importance of medication and oxygen compliance, exercise, good nutrition, pulmonary hygiene, and pursed lip and diaphragmatic breathing for the pulmonary patient.   Exercise for the Pulmonary Patient:  -Group instruction that is supported by a PowerPoint presentation. Instructor discusses benefits of exercise, core components of exercise, frequency, duration, and intensity of an exercise routine, importance of utilizing pulse oximetry during exercise, safety while exercising, and  options of places to exercise outside of rehab.     PULMONARY REHAB OTHER RESPIRATORY from 08/21/2017 in [place]  Date  05/27/17  Educator  ep  Instruction Review Code  2- meets goals/outcomes      Pulmonary Medications:  -Verbally interactive group education provided by instructor with focus on inhaled medications and proper administration.   Anatomy and Physiology of the Respiratory System and Intimacy:  -  Group instruction provided by PowerPoint, verbal discussion, and written material to support subject matter. Instructor reviews respiratory cycle and anatomical components of the respiratory system and their functions. Instructor also reviews differences in obstructive and restrictive respiratory diseases with examples of each. Intimacy, Sex, and Sexuality differences are reviewed with a discussion on how relationships can change when diagnosed with pulmonary disease. Common sexual concerns are reviewed.   MD DAY -A group question and answer session with a medical doctor that allows participants to ask questions that relate to their pulmonary disease state.   OTHER EDUCATION -Group or individual verbal, written, or video instructions that support the educational goals of the pulmonary rehab program.   Knowledge Questionnaire Score:   Core Components/Risk Factors/Patient Goals at Admission: Personal Goals and Risk Factors at Admission - 05/09/17 1116      Core Components/Risk Factors/Patient Goals on Admission    Weight Management  Yes;Obesity    Intervention  Weight Management: Develop a combined nutrition and exercise program designed to reach desired caloric intake, while maintaining appropriate intake of nutrient and fiber, sodium and fats, and appropriate energy expenditure required for the weight goal.;Weight Management/Obesity: Establish reasonable short term and long term weight goals.;Obesity: Provide education and appropriate resources to  help participant work on and attain dietary goals.    Expected Outcomes  Short Term: Continue to assess and modify interventions until short term weight is achieved;Long Term: Adherence to nutrition and physical activity/exercise program aimed toward attainment of established weight goal;Weight Loss: Understanding of general recommendations for a balanced deficit meal plan, which promotes 1-2 lb weight loss per week and includes a negative energy balance of [phone] kcal/d;Understanding recommendations for meals to include 15-35% energy as protein, 25-35% energy from fat, 35-60% energy from carbohydrates, less than 271m of dietary cholesterol, 20-35 gm of total fiber daily;Understanding of distribution of calorie intake throughout the day with the consumption of 4-5 meals/snacks    Improve shortness of breath with ADL's  Yes    Intervention  Provide education, individualized exercise plan and daily activity instruction to help decrease symptoms of SOB with activities of daily living.    Expected Outcomes  Short Term: Achieves a reduction of symptoms when performing activities of daily living.    Develop more efficient breathing techniques such as purse lipped breathing and diaphragmatic breathing; and practicing self-pacing with activity  Yes    Intervention  Provide education, demonstration and support about specific breathing techniuqes utilized for more efficient breathing. Include techniques such as pursed lipped breathing, diaphragmatic breathing and self-pacing activity.    Expected Outcomes  Short Term: Participant will be able to demonstrate and use breathing techniques as needed throughout daily activities.       Core Components/Risk Factors/Patient Goals Review:  Goals and Risk Factor Review    Row Name 06/06/17 1205 07/06/17 2154 07/28/17 1617 08/19/17 1245 09/11/17 [id number]    Core Components/Risk Factors/Patient Goals Review   Personal Goals Review  Weight Management/Obesity;Improve  shortness of breath with ADL's;Develop more efficient breathing techniques such as purse lipped breathing and diaphragmatic breathing and practicing self-pacing with activity.  Weight Management/Obesity;Improve shortness of breath with ADL's;Develop more efficient breathing techniques such as purse lipped breathing and diaphragmatic breathing and practicing self-pacing with activity.  Weight Management/Obesity;Improve shortness of breath with ADL's;Develop more efficient breathing techniques such as purse lipped breathing and diaphragmatic breathing and practicing self-pacing with activity.  Weight Management/Obesity;Improve shortness of breath with ADL's;Develop more efficient breathing techniques such as purse lipped breathing and  diaphragmatic breathing and practicing self-pacing with activity.  Weight Management/Obesity;Improve shortness of breath with ADL's;Develop more efficient breathing techniques such as purse lipped breathing and diaphragmatic breathing and practicing self-pacing with activity.   Review  patient has attended only 2 sessions since admission and too early to evaluate progress towards goals  patient has only attended 4 sessions since admission. She has several barriers to participation. her son has been denied a lung transplant related to alcohol and drug addictions. he is end stage CF and 63 years old. She also is experiencing what she describes as palpatations during exertion and at rest but this cannont be seen on the cardiac monitor. She is not making any progress towards goals related to her inconsistant attendance.  patient is currently on hold from pulmonary rehab. She was experiencing palpatations and has been referred to a cardiologist for workup 08/01/17. She had not progressed towards her pulmonary goals at last session. will re-evaluate 30 days after patient released from medical hold  today is patients first day back from being on medical hold. She has recieved clearence from  her cardiologist to return. She was able to tolerate the same workloads she was on when she left and maintained an RPE between 11 and 13. Will evaluate progress towards goals over the next 30 days  recieved a note on my desk this am stating that patient had called and requested to be dropped from pulmonary rehab. Patient has struggled with her attendance related to emotional struggles from the sudden loss of her husband and the chronic illness of her son. Will discharge patient as she request.   Expected Outcomes  see admission outcomes  see admission outcomes  see admission outcomes  see admission outcomes  see admission outcomes      Core Components/Risk Factors/Patient Goals at Discharge (Final Review):  Goals and Risk Factor Review - 09/11/17 0702      Core Components/Risk Factors/Patient Goals Review   Personal Goals Review  Weight Management/Obesity;Improve shortness of breath with ADL's;Develop more efficient breathing techniques such as purse lipped breathing and diaphragmatic breathing and practicing self-pacing with activity.    Review  recieved a note on my desk this am stating that patient had called and requested to be dropped from pulmonary rehab. Patient has struggled with her attendance related to emotional struggles from the sudden loss of her husband and the chronic illness of her son. Will discharge patient as she request.    Expected Outcomes  see admission outcomes       ITP Comments:   Comments: Patient has only attended 7 sessions since her first session on 9/11. She has not met any of her program goals. She will be discharged as requested.

## 2017-09-11 NOTE — Progress Notes (Signed)
Discharge Progress Report  Patient Details  Name: Stacey Alvarez MRN: [id number] Date of Birth: 1956/04/24 Referring Provider:     Pulmonary Rehab Walk Test from 05/20/2017 in [place]  Referring Provider  Dr. Nelda Marseille       Number of Visits: 7  Reason for Discharge:  Early Exit:  Lack of attendance, per patient request  Smoking History:  Social History   Tobacco Use  Smoking Status Former Smoker  Smokeless Tobacco Never Used  Tobacco Comment   smoked 2 years in late teens about 8-10 cigs daily.     Diagnosis:  ILD (interstitial lung disease) ([place])  ADL [facility]:   Initial Exercise Prescription: Initial Exercise Prescription - 05/20/17 1600      Date of Initial Exercise RX and Referring Provider   Date  05/20/17    Referring Provider  Dr. Nelda Marseille      Bike   Level  0.5    Minutes  17      NuStep   Level  2    Minutes  17      Track   Laps  10    Minutes  17      Prescription Details   Frequency (times per week)  2    Duration  Progress to 45 minutes of aerobic exercise without signs/symptoms of physical distress      Intensity   THRR 40-80% of Max Heartrate  63-126    Ratings of Perceived Exertion  11-13    Perceived Dyspnea  0-4      Progression   Progression  Continue progressive overload as per policy without signs/symptoms or physical distress.      Resistance Training   Training Prescription  Yes    Weight  blue bands    Reps  10-15       Discharge Exercise Prescription (Final Exercise Prescription Changes): Exercise Prescription Changes - 08/21/17 1248      Response to Exercise   Blood Pressure (Admit)  134/62    Blood Pressure (Exercise)  150/70    Blood Pressure (Exit)  120/48    Heart Rate (Admit)  93 bpm    Heart Rate (Exercise)  109 bpm    Heart Rate (Exit)  91 bpm    Oxygen Saturation (Admit)  97 %    Oxygen Saturation (Exercise)  90 %    Oxygen Saturation (Exit)  96 %    Rating of Perceived Exertion  (Exercise)  9    Perceived Dyspnea (Exercise)  1    Duration  Progress to 45 minutes of aerobic exercise without signs/symptoms of physical distress    Intensity  THRR unchanged      Resistance Training   Training Prescription  Yes    Weight  blue bands    Reps  10-15      NuStep   Level  4    Minutes  2.2      Track   Laps  11    Minutes  17       Functional Capacity: 6 Minute Walk    Row Name 05/20/17 1632         6 Minute Walk   Phase  Initial     Distance  1610 feet     Walk Time  6 minutes     # of Rest Breaks  0     MPH  3.04     METS  3.3     RPE  13     Perceived Dyspnea   3     Symptoms  No     Resting HR  102 bpm     Resting BP  135/84     Resting Oxygen Saturation   97 %     Exercise Oxygen Saturation  during 6 min walk  90 %     Max Ex. HR  120 bpm     Max Ex. BP  142/72       Interval HR   1 Minute HR  102     2 Minute HR  116     3 Minute HR  119     4 Minute HR  119     5 Minute HR  119     6 Minute HR  120     2 Minute Post HR  113     Interval Heart Rate?  Yes       Interval Oxygen   Interval Oxygen?  Yes     Baseline Oxygen Saturation %  97 %     1 Minute Oxygen Saturation %  96 %     1 Minute Liters of Oxygen  0 L     2 Minute Oxygen Saturation %  91 %     2 Minute Liters of Oxygen  0 L     3 Minute Oxygen Saturation %  91 %     3 Minute Liters of Oxygen  0 L     4 Minute Oxygen Saturation %  91 %     4 Minute Liters of Oxygen  0 L     5 Minute Oxygen Saturation %  90 %     5 Minute Liters of Oxygen  0 L     6 Minute Oxygen Saturation %  90 %     6 Minute Liters of Oxygen  0 L     2 Minute Post Oxygen Saturation %  92 %     2 Minute Post Liters of Oxygen  0 L        Psychological, QOL, Others - Outcomes: PHQ 2/9: Depression screen [facility] 2/9 05/09/2017 01/06/2017 12/02/2016 11/24/2015 11/21/2014  Decreased Interest 2 1 0 1 0  Down, Depressed, Hopeless 1 1 0 1 1  PHQ - 2 Score 3 2 0 2 1  Altered sleeping 1 0 0 2 -  Tired,  decreased energy 2 0 0 2 -  Change in appetite 3 0 0 2 -  Feeling bad or failure about yourself  3 0 0 1 -  Trouble concentrating 3 0 0 1 -  Moving slowly or fidgety/restless 0 0 0 0 -  Suicidal thoughts 0 0 0 0 -  PHQ-9 Score 15 2 0 10 -  Difficult doing work/chores Somewhat difficult - - Not difficult at all -    Quality of Life:   Personal Goals: Goals established at orientation with interventions provided to work toward goal. Personal Goals and Risk Factors at Admission - 05/09/17 1116      Core Components/Risk Factors/Patient Goals on Admission    Weight Management  Yes;Obesity    Intervention  Weight Management: Develop a combined nutrition and exercise program designed to reach desired caloric intake, while maintaining appropriate intake of nutrient and fiber, sodium and fats, and appropriate energy expenditure required for the weight goal.;Weight Management/Obesity: Establish reasonable short term and long term weight goals.;Obesity: Provide education and appropriate resources to help participant work on and  attain dietary goals.    Expected Outcomes  Short Term: Continue to assess and modify interventions until short term weight is achieved;Long Term: Adherence to nutrition and physical activity/exercise program aimed toward attainment of established weight goal;Weight Loss: Understanding of general recommendations for a balanced deficit meal plan, which promotes 1-2 lb weight loss per week and includes a negative energy balance of [phone] kcal/d;Understanding recommendations for meals to include 15-35% energy as protein, 25-35% energy from fat, 35-60% energy from carbohydrates, less than 200mg  of dietary cholesterol, 20-35 gm of total fiber daily;Understanding of distribution of calorie intake throughout the day with the consumption of 4-5 meals/snacks    Improve shortness of breath with ADL's  Yes    Intervention  Provide education, individualized exercise plan and daily activity  instruction to help decrease symptoms of SOB with activities of daily living.    Expected Outcomes  Short Term: Achieves a reduction of symptoms when performing activities of daily living.    Develop more efficient breathing techniques such as purse lipped breathing and diaphragmatic breathing; and practicing self-pacing with activity  Yes    Intervention  Provide education, demonstration and support about specific breathing techniuqes utilized for more efficient breathing. Include techniques such as pursed lipped breathing, diaphragmatic breathing and self-pacing activity.    Expected Outcomes  Short Term: Participant will be able to demonstrate and use breathing techniques as needed throughout daily activities.        Personal Goals Discharge: Goals and Risk Factor Review    Row Name 06/06/17 1205 07/06/17 2154 07/28/17 1617 08/19/17 1245 09/11/17 1610     Core Components/Risk Factors/Patient Goals Review   Personal Goals Review  Weight Management/Obesity;Improve shortness of breath with ADL's;Develop more efficient breathing techniques such as purse lipped breathing and diaphragmatic breathing and practicing self-pacing with activity.  Weight Management/Obesity;Improve shortness of breath with ADL's;Develop more efficient breathing techniques such as purse lipped breathing and diaphragmatic breathing and practicing self-pacing with activity.  Weight Management/Obesity;Improve shortness of breath with ADL's;Develop more efficient breathing techniques such as purse lipped breathing and diaphragmatic breathing and practicing self-pacing with activity.  Weight Management/Obesity;Improve shortness of breath with ADL's;Develop more efficient breathing techniques such as purse lipped breathing and diaphragmatic breathing and practicing self-pacing with activity.  Weight Management/Obesity;Improve shortness of breath with ADL's;Develop more efficient breathing techniques such as purse lipped breathing and  diaphragmatic breathing and practicing self-pacing with activity.   Review  patient has attended only 2 sessions since admission and too early to evaluate progress towards goals  patient has only attended 4 sessions since admission. She has several barriers to participation. her son has been denied a lung transplant related to alcohol and drug addictions. he is end stage CF and 63 years old. She also is experiencing what she describes as palpatations during exertion and at rest but this cannont be seen on the cardiac monitor. She is not making any progress towards goals related to her inconsistant attendance.  patient is currently on hold from pulmonary rehab. She was experiencing palpatations and has been referred to a cardiologist for workup 08/01/17. She had not progressed towards her pulmonary goals at last session. will re-evaluate 30 days after patient released from medical hold  today is patients first day back from being on medical hold. She has recieved clearence from her cardiologist to return. She was able to tolerate the same workloads she was on when she left and maintained an RPE between 11 and 13. Will evaluate progress towards goals over the  next 30 days  recieved a note on my desk this am stating that patient had called and requested to be dropped from pulmonary rehab. Patient has struggled with her attendance related to emotional struggles from the sudden loss of her husband and the chronic illness of her son. Will discharge patient as she request.   Expected Outcomes  see admission outcomes  see admission outcomes  see admission outcomes  see admission outcomes  see admission outcomes      Exercise Goals and Review: Exercise Goals    Row Name 05/09/17 1045             Exercise Goals   Increase Physical Activity  Yes       Intervention  Provide advice, education, support and counseling about physical activity/exercise needs.;Develop an individualized exercise prescription for  aerobic and resistive training based on initial evaluation findings, risk stratification, comorbidities and participant's personal goals.       Expected Outcomes  Achievement of increased cardiorespiratory fitness and enhanced flexibility, muscular endurance and strength shown through measurements of functional capacity and personal statement of participant.       Increase Strength and Stamina  Yes       Intervention  Provide advice, education, support and counseling about physical activity/exercise needs.;Develop an individualized exercise prescription for aerobic and resistive training based on initial evaluation findings, risk stratification, comorbidities and participant's personal goals.       Expected Outcomes  Achievement of increased cardiorespiratory fitness and enhanced flexibility, muscular endurance and strength shown through measurements of functional capacity and personal statement of participant.          Nutrition & Weight - Outcomes:    Nutrition:   Nutrition Discharge:   Education Questionnaire Score:   All goals unmet.

## 2017-09-12 MED FILL — MYCOPHENOLATE MOFETIL/500MG/TABS: MYCOPHENOLATE MOFETIL/500MG/TABS | 30 days supply | Qty: 120 | Fill #2

## 2017-09-18 DIAGNOSIS — G4733 Obstructive sleep apnea (adult) (pediatric): Secondary | ICD-10-CM | POA: Diagnosis not present

## 2017-09-20 NOTE — Unmapped (Signed)
[facility] at [facility] for Esophageal Diseases and Swallowing ([facility])  Consultation Visit Note      [facility] CONSULTATION VISIT      REFERRING PROVIDER:  [name], MD  [address]  [address]  [place], [place] [address]    PRIMARY CARE PROVIDER:  Frederico Hamman, MD      PATIENT PROFILE:        Amber Bush is a 62 y.o. female (DOB: May 22, 1956) who is seen in consultation at the request of Dr. Dudley Major for evaluation of esophageal dysmotility.          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 Her GERD appears to be well-controlled on pantoprazole. However she does have some symptoms (cough and voice-changes) which may be extraesophageal manifestations of GERD. Will continue on pantoprazole 40mg  daily (30 minutes prior to breakfast) and add Gaviscon for 2 weeks to see if there is any acid-mediated component to these symptoms. Based on her normal 24-h pH I anticipate that there will not be much acid-mediated component. Regarding her esophageal motility: while she had hypertensive LES with EGJ outflow obstruction she does have normal liquid transit suggesting that motility is preserved. She did have a food impaction recently however so there may be some symptomatic EGJ outflow obstruction. Recommend assessing further with EGD and empiric dilation of the LES. We will also have FEES performed in clinic today to exclude any oropharyngeal component of her dysphagia.       PLAN:        - continue pantoprazole 40mg  daily, take 30 minutes prior to breakfast  - start a 2 week trial of Gaviscon prior to breakfast and bedtime to see if this has an effect on extraesophageal manifestations  (cough and voice changes)  - FEES today in clinic showed good bolus transit, she did have dried secretions in her hypopharynx which cleared with water. She may have some component of hypersensitivity from these thicker secretions  - EGD with empiric LES dilation   - follow up in 3 months    Patient was seen by and discussed with Dr. Wilford Corner who agrees with the above assessment and plan.         CHIEF COMPLAINT: dysphagia    HISTORY OF PRESENT ILLNESS: This is a 62 y.o. 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 Amber Bush was incidentally diagnosed with ILD when imaging for back pain showed basilar marking. She follows with Dr. Dudley Major and is currently on prednisone 10mg /day and cellcept 1000mg  BID for treatment of her hypersensitivity pneumonitis. She had a work up including 24 hour pH testing and esophageal manometry in order to assess for any component of reflux as a driver of her ILD. 24-h pH study showed normal acid exposure in the distal esophagus. Her manometry (performed in May) showed hypertensive UES and LES, EGJ outflow obstruction with normal esophageal transit. Over the past 6 months Amber Bush has had difficulty swallowing which has been more progressive. 2 weeks ago she had an episode of food impaction while at a friend's house for dinner. A piece of chicken got stuck and she was unable to get it down  with water. She eventually forced it up with coughing. She has not had any difficulty with liquid or pills though her fenofibrate is a little larger and takes extra water to get down. She has no heartburn symptoms since being on pantoprazole 40mg  daily for the past year. She does have some extraintestinal manifestations of reflux including cough and horse voice. She also notes cough after eating, no trouble with initiation of swallowing. No abdominal pain, nausea, vomiting, or change in bowels. She had an EGD in 2016 which showed some gastropathy but was otherwise normal.     REVIEW OF SYSTEMS:     The balance of 12 systems reviewed is negative except as noted in the HPI.     PAST MEDICAL HISTORY:    Past Medical History:   Diagnosis Date   ??? Colon polyp    ??? Diverticulitis of colon    ??? Fibrocystic breast    ??? Hypertension    ??? ILD (interstitial lung disease) (CMS-HCC)    ??? OSA (obstructive sleep apnea)    ??? PE (pulmonary thromboembolism) (CMS-HCC)     on xarelto       PAST SURGICAL HISTORY:    Past Surgical History:   Procedure Laterality Date   ??? CHOLECYSTECTOMY     ??? HYSTERECTOMY     ??? PR ESOPHAGEAL MOTILITY STUDY, MANOMETRY N/A 01/16/2017    Procedure: ESOPHAGEAL MOTILITY STUDY W/INT & REP;  Surgeon: Nurse-Based Giproc;  [facility];  Service: Gastroenterology   ??? PR GERD TST W/ NASAL IMPEDENCE ELECTROD N/A 01/16/2017    Procedure: ESOPH FUNCT TST NASL ELEC PLCMT;  Surgeon: Nurse-Based Giproc;  Location: [facility];  Service: Gastroenterology   ??? PR GERD TST W/ NASAL PH ELECTROD N/A 01/16/2017    Procedure: ESOPHAGEAL 24 HOUR PH MONITORING;  Surgeon: Nurse-Based Giproc;  [facility]: [facility];  Service: Gastroenterology       MEDICATIONS:      Current Outpatient Prescriptions:   ???  ALPRAZolam (XANAX) 1 MG tablet, Take 2 mg by mouth nightly. 1/2 tab in the morning., Disp: , Rfl:   ???  amLODIPine (NORVASC) 5 MG tablet, Take 5 mg by mouth daily., Disp: , Rfl:   ???  cetirizine (ZYRTEC) 10 MG tablet, Take 10 mg by mouth daily., Disp: , Rfl:   ???  cyclobenzaprine (FLEXERIL) 10 MG tablet, Take 10 mg by mouth two (2) times a day as needed for muscle spasms., Disp: , Rfl:   ???  estrogens, conjugated, (PREMARIN) 0.625 MG tablet, Take 0.625 mg by mouth daily. Take daily for 21 days then do not take for 7 days., Disp: , Rfl:   ???  fenofibrate (LOFIBRA) 160 MG tablet, Take 160 mg by mouth daily., Disp: , Rfl:   ???  FLUoxetine (PROZAC) 40 MG capsule, Take 40 mg by mouth daily., Disp: , Rfl:   ???  fluticasone (FLONASE) 50 mcg/actuation nasal spray, 1 spray by Each Nare route daily., Disp: , Rfl:   ???  lamoTRIgine (LAMICTAL) 100 MG tablet, Take 100 mg by mouth daily., Disp: , Rfl:   ???  lisinopril (PRINIVIL,ZESTRIL) 20 MG tablet, Take 20 mg by mouth Two (2) times a day. , Disp: , Rfl:   ???  mycophenolate (CELLCEPT) 500 mg tablet, Take 2 tablets (1,000 mg total) by mouth Two (2) times a day., Disp: 120 tablet, Rfl: 6  ???  pantoprazole (PROTONIX) 40 MG tablet, Take 40 mg by mouth daily. 10mg , Disp: , Rfl:   ???  predniSONE (DELTASONE) 10 MG tablet, Take 4 tablets (40 mg total) by mouth daily. (Patient taking differently: Take 5 mg by mouth daily. 5 mg daily), Disp: 120 tablet, Rfl: 11  ???  promethazine (PHENERGAN) 25 MG tablet, Take 25 mg by mouth every six (6) hours as needed for nausea., Disp: , Rfl:   ???  ranitidine (ZANTAC) 150 MG tablet, Take 150 mg by mouth Two (2) times a day., Disp: , Rfl:   ???  rivaroxaban (XARELTO) 20 mg tablet, Take 1 tablet (20 mg total) by mouth daily with evening meal., Disp: 30 tablet, Rfl: 2  ???  temAZEpam (RESTORIL) 30 mg capsule, Take 30 mg by mouth nightly as needed for sleep., Disp: , Rfl:     (Not in a hospital admission)    ALLERGIES:    Sulfa (sulfonamide antibiotics) and Sulfasalazine    SOCIAL HISTORY:    Social History     Social History   ??? Marital status: Married     Spouse name: N/A   ??? Number of children: N/A   ??? Years of education: N/A     Social History Main Topics   ??? Smoking status: Never Smoker   ??? Smokeless tobacco: Never Used   ??? Alcohol use No   ??? Drug use: No   ??? Sexual activity: No     Other Topics Concern   ??? Not on file     Social History Narrative   ??? No narrative on file       FAMILY HISTORY:    family history includes Heart disease in her father and mother; Hypertension in her father and mother; Kidney disease in her father and mother.      VITAL SIGNS:    There were no vitals taken for this visit.    PHYSICAL EXAM:    Constitutional:   Alert, oriented x 3, no acute distress, well nourished, and well hydrated.   Mental Status:   Thought organized, appropriate affect, pleasantly interactive, not anxious appearing.   HEENT:   PERRL, conjunctiva clear, anicteric, oropharynx clear, neck supple, no LAD.   Respiratory: RLL crackles, left clear to auscultation      Cardiac: Euvolemic, regular rate and rhythm, normal S1 and S2, no murmur.     Abdomen: Obese but soft, normal bowel sounds, non-distended, non-tender, no organomegaly or masses.     Perianal/Rectal Exam Not performed.     Extremities:   No edema, well perfused.   Musculoskeletal: No joint swelling or tenderness noted, no deformities.     Skin: No rashes, jaundice or skin lesions noted.     Neuro: No focal deficits.          DIAGNOSTIC STUDIES:  I have reviewed all pertinent diagnostic studies, including:    GI Procedures:    Ph probe 5/18:      DISTAL PROBE:       - Acid Exposure Time(s) for pH < 4.0:       Total Percent Time: 3.1 %.       Supine Percent Time: 0.4 %.       Upright Percent Time: 6.1 %.       PROXIMAL PROBE:       - Acid Exposure Time(s) for pH < 4.0:       Total Percent Time: 0.1 %.       Supine Percent Time: 0.1 %.       Upright Percent Time: 0.2 %.  Impression:        - The distal esophageal acid exposure was normal.                     - The proximal esophageal acid exposure was normal.  Recommendation:    - Return to ordering provider.                                                                                   Manometry 5/18:     LOWER ESOPHAGEAL SPHINCTER (LES):       - Proximal LES border: 38.8 cm from nares.       - Distal LES border: 40.4 cm from nares.       - Pressure Inversion Point: 41 cm from nares.       - Basal / Resting LES pressure: 52 mmHg (eSleeve, normal range 13-43        mmHg).       - Residual LES pressure: 16 mmHg (integrated relaxation, normal < 15        mmHg).       ESOPHAGEAL BODY (motility, peristaltic contractions):       - Number of swallows evaluated: 10.       - Peristaltic: 100 %.       - Normal (effective esophageal peristalsis): 100 %.       - Hypotensive: 0 %.       - Hypertensive: 0 %.       - Simultaneous: 0 %.       - Failed: 0 %.       - Distal contractile integral (mean): 2462.5 mmHg-cm-sec.       - Distal contractile integral (highest): 3400.8 mmHg-cm-sec.       UPPER ESOPHAGEAL SPHINCTER (UES):       - UES location: 19.5 cm from nares.       - Basal / Resting UES pressure: 128 mmHg (mean, normal range 34-104        mmHg).       - Residual UES pressure: 0.5 mmHg (mean, normal < 12 mmHg).       ESOPHAGEAL BOLUS CLEARANCE:       - Incomplete bolus clearance: 0% (normal < 30%)                                                                                   Impression:        - Hypertensive upper esophageal sphincter.                     - Hypertensive lower esophageal sphincter.                     - Gastroesophageal junction (GEJ) outflow obstruction.                     -  The esophageal manometry was otherwise normal.                     - Normal liquid esophageal transit.  Recommendation:    - Return to ordering provider as previously scheduled.      Laboratory results: reviewed from July 2018

## 2017-09-21 ENCOUNTER — Other Ambulatory Visit: Payer: Self-pay | Admitting: Family Medicine

## 2017-09-22 ENCOUNTER — Ambulatory Visit
Admit: 2017-09-22 | Discharge: 2017-09-22 | Payer: MEDICARE | Attending: Student in an Organized Health Care Education/Training Program | Primary: Student in an Organized Health Care Education/Training Program

## 2017-09-22 ENCOUNTER — Ambulatory Visit
Admit: 2017-09-22 | Discharge: 2017-09-22 | Payer: MEDICARE | Attending: Speech-Language Pathologist | Primary: Speech-Language Pathologist

## 2017-09-22 DIAGNOSIS — Z7952 Long term (current) use of systemic steroids: Secondary | ICD-10-CM | POA: Diagnosis not present

## 2017-09-22 DIAGNOSIS — J849 Interstitial pulmonary disease, unspecified: Secondary | ICD-10-CM | POA: Diagnosis not present

## 2017-09-22 DIAGNOSIS — Z8701 Personal history of pneumonia (recurrent): Secondary | ICD-10-CM | POA: Diagnosis not present

## 2017-09-22 DIAGNOSIS — R1310 Dysphagia, unspecified: Secondary | ICD-10-CM | POA: Diagnosis not present

## 2017-09-22 DIAGNOSIS — K224 Dyskinesia of esophagus: Secondary | ICD-10-CM | POA: Diagnosis not present

## 2017-09-22 DIAGNOSIS — Z882 Allergy status to sulfonamides status: Secondary | ICD-10-CM | POA: Diagnosis not present

## 2017-09-22 DIAGNOSIS — M549 Dorsalgia, unspecified: Secondary | ICD-10-CM | POA: Diagnosis not present

## 2017-09-22 DIAGNOSIS — Z86718 Personal history of other venous thrombosis and embolism: Secondary | ICD-10-CM | POA: Diagnosis not present

## 2017-09-22 DIAGNOSIS — Z79899 Other long term (current) drug therapy: Secondary | ICD-10-CM | POA: Diagnosis not present

## 2017-09-22 DIAGNOSIS — K219 Gastro-esophageal reflux disease without esophagitis: Secondary | ICD-10-CM | POA: Diagnosis not present

## 2017-09-22 DIAGNOSIS — Z7901 Long term (current) use of anticoagulants: Secondary | ICD-10-CM | POA: Diagnosis not present

## 2017-09-22 DIAGNOSIS — G4733 Obstructive sleep apnea (adult) (pediatric): Secondary | ICD-10-CM | POA: Diagnosis not present

## 2017-09-22 DIAGNOSIS — Z86711 Personal history of pulmonary embolism: Secondary | ICD-10-CM | POA: Diagnosis not present

## 2017-09-22 DIAGNOSIS — I10 Essential (primary) hypertension: Secondary | ICD-10-CM | POA: Diagnosis not present

## 2017-09-22 DIAGNOSIS — Z6841 Body Mass Index (BMI) 40.0 and over, adult: Secondary | ICD-10-CM | POA: Diagnosis not present

## 2017-09-22 DIAGNOSIS — Z86010 Personal history of colonic polyps: Secondary | ICD-10-CM | POA: Diagnosis not present

## 2017-09-22 DIAGNOSIS — Z9049 Acquired absence of other specified parts of digestive tract: Secondary | ICD-10-CM | POA: Diagnosis not present

## 2017-09-22 NOTE — Unmapped (Signed)
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 She is on a regular diet with thin liquids. She c/o recent choking sensation/globus at level of the thyroid notch with solids as well as difficulty initiating swallows with saliva.       Assessment: Pt presents with an oropharyngeal swallow which is [facility]. No aspiration, penetration or pharyngeal residue observed. Of note, prior to p.o. presentations, mild amount of sthicker secretions on the posterior pharyngeal wall (cleared with liquid rinse). Minimal backflow of thin secretions through the UES.      PO Recommendations : Regular Solids (no restrictions), Thin liquids    Recommended Compensatory Techniques : Alternate liquids and solids, Upright 30 min after meal,  Increase water intake during the day PRN                       PLAN: GI f/u as warranted            Prognosis:  Good                   Goals:  Patient and Family Goals: not stated                SUBJECTIVE:       Communication Preference: Verbal    Barriers to Learning: No Barriers                        Pain?: No               Past Medical History:   Diagnosis Date   ??? Colon polyp    ??? Diverticulitis of colon    ??? Fibrocystic breast    ??? Hypertension    ??? ILD (interstitial lung disease) (CMS-HCC)    ??? OSA (obstructive sleep apnea)    ??? PE (pulmonary thromboembolism) (CMS-HCC)     on xarelto      Family History   Problem Relation Age of Onset   ??? Heart disease Mother    ??? Hypertension Mother    ??? Kidney disease Mother    ??? Heart disease Father    ??? Hypertension Father    ??? Kidney disease Father      Past Surgical History: Procedure Laterality Date   ??? CHOLECYSTECTOMY     ??? HYSTERECTOMY     ??? PR ESOPHAGEAL MOTILITY STUDY, MANOMETRY N/A 01/16/2017    Procedure: ESOPHAGEAL MOTILITY STUDY W/INT & REP;  Surgeon: Nurse-Based Giproc;  [facility];  Service: Gastroenterology   ??? PR GERD TST W/ NASAL IMPEDENCE ELECTROD N/A 01/16/2017    Procedure: ESOPH FUNCT TST NASL ELEC PLCMT;  Surgeon: Nurse-Based Giproc;  [facility];  Service: Gastroenterology   ??? PR GERD TST W/ NASAL PH ELECTROD N/A 01/16/2017    Procedure: ESOPHAGEAL 24 HOUR PH MONITORING;  Surgeon: Nurse-Based Giproc;  [facility]: [facility];  Service: Gastroenterology      Allergies   Allergen Reactions   ??? Sulfa (Sulfonamide  Antibiotics) Rash   ??? Sulfasalazine Rash     Social History   Substance Use Topics   ??? Smoking status: Never Smoker   ??? Smokeless tobacco: Never Used   ??? Alcohol use No      Current Outpatient Prescriptions   Medication Sig Dispense Refill   ??? ALPRAZolam (XANAX) 1 MG tablet Take 2 mg by mouth nightly. 1/2 tab in the morning.     ??? amLODIPine (NORVASC) 5 MG tablet Take 5 mg by mouth daily.     ??? cetirizine (ZYRTEC) 10 MG tablet Take 10 mg by mouth daily.     ??? cyclobenzaprine (FLEXERIL) 10 MG tablet Take 10 mg by mouth two (2) times a day as needed for muscle spasms.     ??? estrogens, conjugated, (PREMARIN) 0.625 MG tablet Take 0.625 mg by mouth daily. Take daily for 21 days then do not take for 7 days.     ??? fenofibrate (LOFIBRA) 160 MG tablet Take 160 mg by mouth daily.     ??? FLUoxetine (PROZAC) 40 MG capsule Take 40 mg by mouth daily.     ??? fluticasone (FLONASE) 50 mcg/actuation nasal spray 1 spray by Each Nare route daily.     ??? lamoTRIgine (LAMICTAL) 100 MG tablet Take 100 mg by mouth daily.     ??? lisinopril (PRINIVIL,ZESTRIL) 20 MG tablet Take 20 mg by mouth Two (2) times a day.      ??? mycophenolate (CELLCEPT) 500 mg tablet Take 2 tablets (1,000 mg total) by mouth Two (2) times a day. 120 tablet 6 ??? pantoprazole (PROTONIX) 40 MG tablet Take 40 mg by mouth daily. 10mg      ??? predniSONE (DELTASONE) 10 MG tablet Take 4 tablets (40 mg total) by mouth daily. (Patient taking differently: Take 10 mg by mouth daily. 5 mg daily) 120 tablet 11   ??? promethazine (PHENERGAN) 25 MG tablet Take 25 mg by mouth every six (6) hours as needed for nausea.     ??? ranitidine (ZANTAC) 150 MG tablet Take 150 mg by mouth Two (2) times a day.     ??? rivaroxaban (XARELTO) 20 mg tablet Take 1 tablet (20 mg total) by mouth daily with evening meal. 30 tablet 2   ??? temAZEpam (RESTORIL) 30 mg capsule Take 30 mg by mouth nightly as needed for sleep.       No current facility-administered medications for this encounter.          Objective Swallow  Cognitive-Communication Status : WFL; mild hoarseness    Presentation Methods Used During Study  Bolus Presentation : Spoon, Straw Sip, Fed self    Thin Liquids   Oral Stage: WFL  Swallow Initiation : WFL  Nasopharyngeal Reflux : None noted  Pharyngeal Stage : WFL  Penetration Aspiration Score: 1 - Material does not enter airway  Aspiration Volume : None              Puree  Oral Stage : WFL  Swallow Initiation : WFL  Nasopharyngeal Reflux: None noted  Pharyngeal Stage : WFL  Penetration Aspiration Score: 1 - Material does not enter airway  Aspiration Volume : None    Solids   Oral Stage : WFL  Swallow Initiation : WFL  Nasopharyngeal Reflux: None noted  Pharyngeal Stage : WFL  Penetration Aspiration Score: 1 - Material does not enter airway  Aspiration Volume : None              Education Provided: Patient (Discussed results  and recommendations)    Education Understood: Understanding verbalized          Session Duration : 35    Today's Charges (noted here with $$):     SLP Evaluations  $$ Endoscopic Swallowing Evaluation [mins]: 35              I attest that I have reviewed the above information.  Signed: Reuel Boom, CCC-SLP  09/22/2017 3:39 PM

## 2017-09-22 NOTE — Unmapped (Addendum)
You were seen today for trouble swallowing.     1. Continue pantoprazole, take 30 minutes prior to breakfast.   2. Add gaviscon chewable tablet before breakfast and bedtime. Try this for 2 weeks to see if it helps with throat clearing and voice changes.   3. You will be called to schedule and upper endoscopy with dilation of the lower esophageal sphincter.   4. Speech therapy will perform a FEES (fluroscopic endoscopic evaluation of swallowing) study today. This assesses for any residual food/liquid after swallowing.    5. Follow up in 3 months.   6. Please contact me via MyChart if you have any questions.

## 2017-09-26 ENCOUNTER — Other Ambulatory Visit: Payer: Self-pay | Admitting: Family Medicine

## 2017-10-04 DIAGNOSIS — G4733 Obstructive sleep apnea (adult) (pediatric): Secondary | ICD-10-CM | POA: Diagnosis not present

## 2017-10-08 NOTE — Unmapped (Signed)
[facility] Refill Coordination Note  Specialty Medication(s): Mycophenolate 500mg   Additional Medications shipped: none    [name], DOB: August 20, 1956  Phone: [phone] (home) , Alternate phone contact: N/A  Phone or address changes today?: No  All above HIPAA information was verified with patient.  Shipping Address: [address]  [place] [address]   Insurance changes? No    Completed refill call assessment today to schedule patient's medication shipment from the [facility] [phone]).      Confirmed the medication and dosage are correct and have not changed: Yes, regimen is correct and unchanged.    Confirmed patient started or stopped the following medications in the past month:  No, there are no changes reported at this time.    Are you tolerating your medication?:  Coty reports tolerating the medication.    ADHERENCE    (Below is required for Medicare Part B or Transplant patients only - per drug):   How many tablets were dispensed last month: 120  Patient currently has 28 remaining.    Did you miss any doses in the past 4 weeks? No missed doses reported.    FINANCIAL/SHIPPING    Delivery Scheduled: Yes, Expected medication delivery date: 10/14/2017     [name] did not have any additional questions at this time.    Delivery address validated in [facility] scheduling system: Yes, address listed in [facility] is correct.    We will follow up with patient monthly for standard refill processing and delivery.      Thank you,  Breck Coons [facility] Specialty Pharmacist

## 2017-10-10 ENCOUNTER — Encounter (HOSPITAL_COMMUNITY): Payer: Self-pay

## 2017-10-10 NOTE — Addendum Note (Signed)
Encounter addended by: Jewel Baize, RD on: 10/10/2017 11:24 AM  Actions taken: Visit Navigator Flowsheet section accepted

## 2017-10-13 MED ORDER — XARELTO 20 MG TABLET
ORAL_TABLET | 0 refills | 0 days | Status: CP
Start: 2017-10-13 — End: 2017-11-27

## 2017-10-13 MED FILL — MYCOPHENOLATE MOFE/500MG/TABS: MYCOPHENOLATE MOFE/500MG/TABS | 30 days supply | Qty: 120 | Fill #3

## 2017-10-13 NOTE — Unmapped (Signed)
Thanks

## 2017-10-20 NOTE — Progress Notes (Signed)
Cardiology Office Note   Date:  10/21/2017   ID:  Stacey Alvarez, DOB 02-Sep-1956, MRN [id number]  PCP:  Midge Minium, MD  Cardiologist:   Skeet Latch, MD   Chief Complaint  Patient presents with  . Follow-up     History of Present Illness: Stacey Alvarez is a 62 y.o. female with hypertension, hyperlipidemia, hypertriglyceridemia  ILD, prior DVT/PE, morbid obesity, and OSA here for follow-up.  She was initially seen 07/2017 for the evaluation of palpitations.  She had one episode of palpitations that occurred during pulmonary rehab where her vital signs were found to be within normal limits.  She feels like her heart is pounding very hard when exercising and even after she quits.  She had a 1-lead EKG while having symptoms  that reportedly showed normal sinus rhythm.  She notes that her oxygen saturations and heart rate are always within normal limits but she feels poorly.  She was referred for [facility] 07/2017 that revealed LVEF 67% and no ischemia.  Stacey Alvarez had a pulmonary embolism 09/2016.  Echo at that time revealed LVEF 65-70% with grade 1 diastolic dysfunction and mild aortic regurgitation.  Stacey Alvarez graduated from the pulmonary rehab program.  She has not been getting much formal exercise since finishing that.  She has stable shortness of breath.  She does try to do some walking around her neighborhood but is not consistent with this.  She has no chest pain or pressure.  She denies lower extremity edema, orthopnea, or PND.  She is frustrated because she continues to gain weight on prednisone.  She has been told that she will have to be on prednisone and CellCept for the rest of her life.  She also continues to struggle with the loss of her husband of 42 years.  She has a lot of family support and is participating in counseling and grief share program.  She is also stressed by her son who has cystic fibrosis and a drug addict.  She notes that her blood pressure has been  running high when she checks it at her doctor's offices.   Past Medical History:  Diagnosis Date  . Allergy   . Anxiety   . Arthritis    KNEES,BACK,HIPS  . Depression   . Fibromyalgia   . GERD (gastroesophageal reflux disease)   . Hyperlipidemia   . Hypertension   . IBS (irritable bowel syndrome)   . Interstitial lung disease ([place])   . Sleep apnea     Past Surgical History:  Procedure Laterality Date  . CHOLECYSTECTOMY    . COLONOSCOPY    . PARTIAL HYSTERECTOMY  1999  . POLYPECTOMY       Current Outpatient Medications  Medication Sig Dispense Refill  . ALPRAZolam (XANAX) 1 MG tablet 0.5 mg in the am and 2mg  at night    . amLODipine (NORVASC) 5 MG tablet TAKE 1/2 IN THE MORNING AND 1 TABLET(5 MG) BY MOUTH AT BEDTIME 135 tablet 1  . butalbital-aspirin-caffeine-codeine (FIORINAL WITH CODEINE) 50-325-40-30 MG capsule TAKE 1 CAPSULE BY MOUTH EVERY 6 HOURS AS NEEDED FOR PAIN 30 capsule 0  . cetirizine (ZYRTEC) 10 MG tablet Take 10 mg by mouth daily.    . cyclobenzaprine (FLEXERIL) 10 MG tablet Take 1 tablet (10 mg total) by mouth 3 (three) times daily as needed for muscle spasms. 90 tablet 0  . fenofibrate 160 MG tablet TAKE 1 TABLET(160 MG) BY MOUTH DAILY 90 tablet 0  . FLUoxetine (PROZAC) 20  MG tablet Take 40 mg by mouth daily.    Marland Kitchen gabapentin (NEURONTIN) 600 MG tablet TK 1 TO 2 TS PO QHS  4  . lamoTRIgine (LAMICTAL) 100 MG tablet Take 100 mg by mouth daily.    Marland Kitchen lisinopril (PRINIVIL,ZESTRIL) 20 MG tablet TAKE 1 TABLET(20 MG) BY MOUTH TWICE DAILY 180 tablet 0  . meclizine (ANTIVERT) 25 MG tablet Take 1 tablet (25 mg total) by mouth 3 (three) times daily as needed for dizziness. 45 tablet 0  . mycophenolate (CELLCEPT) 500 MG tablet Take 1,000 mg by mouth.    . OXYGEN Inhale 2 L into the lungs as needed (SOB).    . pantoprazole (PROTONIX) 40 MG tablet TAKE ONE TABLET BY MOUTH DAILY 30 tablet 0  . pravastatin (PRAVACHOL) 20 MG tablet TAKE 1 TABLET(20 MG) BY MOUTH DAILY 90 tablet  0  . predniSONE (DELTASONE) 10 MG tablet Take 5 mg by mouth daily.  11  . promethazine (PHENERGAN) 25 MG tablet Take 1 tablet (25 mg total) by mouth every 8 (eight) hours as needed for nausea or vomiting. 45 tablet 1  . rivaroxaban (XARELTO) 20 MG TABS tablet Take 1 tablet (20 mg total) by mouth daily with supper. 30 tablet 5  . temazepam (RESTORIL) 30 MG capsule Take 30 mg by mouth at bedtime.     No current facility-administered medications for this visit.     Allergies:   Macrobid [nitrofurantoin monohyd macro] and Sulfa antibiotics    Social History:  The patient  reports that she has quit smoking. she has never used smokeless tobacco. She reports that she does not drink alcohol or use drugs.   Family History:  The patient's family history includes Alcohol abuse in her son; Anxiety disorder in her father and mother; Breast cancer in her maternal aunt; Cystic fibrosis in her son; Depression in her father and mother; Diabetes in her father, mother, and paternal grandmother; Heart disease in her father and mother; Kidney disease in her mother.    ROS:  Please see the history of present illness.   Otherwise, review of systems are positive for none.   All other systems are reviewed and negative.    PHYSICAL EXAM: VS:  BP 136/76   Pulse 100   Ht 5\' 4"  (1.626 m)   Wt 260 lb 6.4 oz (118.1 kg)   BMI 44.70 kg/m  , BMI Body mass index is 44.7 kg/m. GENERAL:  Well appearing.   HEENT: Pupils equal round and reactive, fundi not visualized, oral mucosa unremarkable NECK:  No jugular venous distention, waveform within normal limits, carotid upstroke brisk and symmetric, no bruits LUNGS:  Clear to auscultation bilaterally HEART:  RRR.  PMI not displaced or sustained,S1 and S2 within normal limits, no S3, no S4, no clicks, no rubs, no murmurs ABD:  Flat, positive bowel sounds normal in frequency in pitch, no bruits, no rebound, no guarding, no midline pulsatile mass, no hepatomegaly, no  splenomegaly EXT:  2 plus pulses throughout, no edema, no cyanosis no clubbing SKIN:  No rashes no nodules NEURO:  Cranial nerves II through XII grossly intact, motor grossly intact throughout PSYCH:  Cognitively intact, oriented to person place and time  EKG:  EKG is not ordered today. The ekg ordered today demonstrates sinus rhythm.  Rate 91 bpm.  LAFB.  LVH.  Poor R wave progression.  Echo 10/07/16: Study Conclusions  - Left ventricle: The cavity size was normal. There was mild   concentric hypertrophy. Systolic function was  vigorous. The   estimated ejection fraction was in the range of 65% to 70%. Wall   motion was normal; there were no regional wall motion   abnormalities. Doppler parameters are consistent with abnormal   left ventricular relaxation (grade 1 diastolic dysfunction).   Doppler parameters are consistent with elevated ventricular   end-diastolic filling pressure. - Aortic valve: There was mild regurgitation. - Left atrium: The atrium was normal in size. - Right ventricle: The cavity size was normal. Wall thickness was   normal. Systolic function was normal. - Tricuspid valve: There was mild regurgitation. - Pulmonary arteries: Systolic pressure was within the normal   range. - Inferior vena cava: The vessel was normal in size. - Pericardium, extracardiac: There was no pericardial effusion.   Lexiscan Myoview 08/12/17:  The left ventricular ejection fraction is hyperdynamic (>65%).  Nuclear stress EF: 67%.  There was no ST segment deviation noted during stress.  The study is normal.  This is a low risk study.   Normal resting and stress perfusion. No ischemia or infarction EF 67%   Recent Labs: 07/11/2017: ALT 15; BUN 11; Creatinine, Ser 0.65; Hemoglobin 12.3; Platelets 329.0; Potassium 4.0; Sodium 137; TSH 0.48    Lipid Panel    Component Value Date/Time   CHOL 208 (H) 07/11/2017 1121   TRIG 153.0 (H) 07/11/2017 1121   HDL 54.50 07/11/2017  1121   CHOLHDL 4 07/11/2017 1121   VLDL 30.6 07/11/2017 1121   LDLCALC 123 (H) 07/11/2017 1121   LDLDIRECT 144.0 06/12/2016 1112      Wt Readings from Last 3 Encounters:  10/21/17 260 lb 6.4 oz (118.1 kg)  08/21/17 254 lb 3.1 oz (115.3 kg)  08/19/17 253 lb 8.5 oz (115 kg)      ASSESSMENT AND PLAN:  # Exertional palpitations: Resolved after reducing caffeine.   # Hypertension:  Blood pressure remains elevated.  Start amloidipine 2.5mg  daily and continue 5mg  qhs.  Continue lisinopril.   # Hyperlipidemia: Continue rosuvastatin.  LDL 123 06/2017.   # Prior PE/DVT: On Xarelto.   # Morbid obesity:  Stacey Alvarez continues to struggle with weight gain.  This is partially attributable to her prednisone use.  However, she is also not very physically active.  We discussed the importance of increasing her exercise 250 minutes/week.  Her caloric intake is already low.   Current medicines are reviewed at length with the patient today.  The patient does not have concerns regarding medicines.  The following changes have been made:  no change  Labs/ tests ordered today include:   No orders of the defined types were placed in this encounter.    Disposition:   FU with Abhiraj Dozal C. Oval Linsey, MD, [facility] in 3 months.    This note was written with the assistance of speech recognition software.  Please excuse any transcriptional errors.  Signed, Lauramae Kneisley C. Oval Linsey, MD, [facility]  10/21/2017 9:47 AM    [facility]

## 2017-10-21 ENCOUNTER — Encounter: Payer: Self-pay | Admitting: Cardiovascular Disease

## 2017-10-21 ENCOUNTER — Ambulatory Visit: Payer: BLUE CROSS/BLUE SHIELD | Admitting: Cardiovascular Disease

## 2017-10-21 VITALS — BP 136/76 | HR 100 | Ht 64.0 in | Wt 260.4 lb

## 2017-10-21 DIAGNOSIS — E7800 Pure hypercholesterolemia, unspecified: Secondary | ICD-10-CM | POA: Diagnosis not present

## 2017-10-21 DIAGNOSIS — R1313 Dysphagia, pharyngeal phase: Secondary | ICD-10-CM | POA: Diagnosis not present

## 2017-10-21 DIAGNOSIS — R002 Palpitations: Secondary | ICD-10-CM

## 2017-10-21 DIAGNOSIS — I10 Essential (primary) hypertension: Secondary | ICD-10-CM

## 2017-10-21 MED ORDER — AMLODIPINE BESYLATE 5 MG PO TABS: ORAL_TABLET | ORAL | 1 refills | Status: DC

## 2017-10-21 NOTE — Patient Instructions (Signed)
Medication Instructions:  INCREASE YOUR AMLODIPINE TO 2.5 MG IN THE MORNING AND 5 MG IN THE EVENING  Labwork: NONE  Testing/Procedures: NONE   Follow-Up: Your physician recommends that you schedule a follow-up appointment in: 3  MONTH OV  If you need a refill on your cardiac medications before your next appointment, please call your pharmacy.

## 2017-10-22 ENCOUNTER — Other Ambulatory Visit: Payer: Self-pay | Admitting: Family Medicine

## 2017-10-27 ENCOUNTER — Ambulatory Visit: Admit: 2017-10-27 | Discharge: 2017-10-27 | Payer: MEDICARE

## 2017-10-27 ENCOUNTER — Ambulatory Visit: Admit: 2017-10-27 | Discharge: 2017-10-27 | Payer: MEDICARE | Attending: Internal Medicine | Primary: Internal Medicine

## 2017-10-27 DIAGNOSIS — Z6841 Body Mass Index (BMI) 40.0 and over, adult: Secondary | ICD-10-CM | POA: Diagnosis not present

## 2017-10-27 DIAGNOSIS — J849 Interstitial pulmonary disease, unspecified: Secondary | ICD-10-CM | POA: Diagnosis not present

## 2017-10-27 DIAGNOSIS — D8989 Other specified disorders involving the immune mechanism, not elsewhere classified: Secondary | ICD-10-CM | POA: Diagnosis not present

## 2017-10-27 DIAGNOSIS — Z86711 Personal history of pulmonary embolism: Secondary | ICD-10-CM | POA: Diagnosis not present

## 2017-10-27 DIAGNOSIS — Z79899 Other long term (current) drug therapy: Secondary | ICD-10-CM | POA: Diagnosis not present

## 2017-10-27 DIAGNOSIS — Z86718 Personal history of other venous thrombosis and embolism: Secondary | ICD-10-CM | POA: Diagnosis not present

## 2017-10-27 DIAGNOSIS — Z7901 Long term (current) use of anticoagulants: Secondary | ICD-10-CM | POA: Diagnosis not present

## 2017-10-27 DIAGNOSIS — R0602 Shortness of breath: Secondary | ICD-10-CM | POA: Diagnosis not present

## 2017-10-27 DIAGNOSIS — M359 Systemic involvement of connective tissue, unspecified: Secondary | ICD-10-CM

## 2017-10-27 LAB — CBC W/ AUTO DIFF
BASOPHILS ABSOLUTE COUNT: 0 10*9/L (ref 0.0–0.1)
BASOPHILS RELATIVE PERCENT: 0.4 %
EOSINOPHILS RELATIVE PERCENT: 1.7 %
HEMATOCRIT: 37.5 % (ref 36.0–46.0)
HEMOGLOBIN: 12.1 g/dL (ref 12.0–16.0)
LYMPHOCYTES ABSOLUTE COUNT: 1.1 10*9/L — ABNORMAL LOW (ref 1.5–5.0)
LYMPHOCYTES RELATIVE PERCENT: 17.4 %
MEAN CORPUSCULAR HEMOGLOBIN CONC: 32.3 g/dL (ref 31.0–37.0)
MEAN CORPUSCULAR HEMOGLOBIN: 26.6 pg (ref 26.0–34.0)
MEAN CORPUSCULAR VOLUME: 82.4 fL (ref 80.0–100.0)
MEAN PLATELET VOLUME: 7.4 fL (ref 7.0–10.0)
MONOCYTES RELATIVE PERCENT: 4.3 %
NEUTROPHILS ABSOLUTE COUNT: 4.7 10*9/L (ref 2.0–7.5)
NEUTROPHILS RELATIVE PERCENT: 74.1 %
PLATELET COUNT: 304 10*9/L (ref 150–440)
RED BLOOD CELL COUNT: 4.55 10*12/L (ref 4.00–5.20)
WBC ADJUSTED: 6.4 10*9/L (ref 4.5–11.0)

## 2017-10-27 LAB — HEPATIC FUNCTION PANEL
ALT (SGPT): 29 U/L (ref 15–48)
AST (SGOT): 21 U/L (ref 14–38)
BILIRUBIN DIRECT: 0.4 mg/dL (ref 0.00–0.40)
BILIRUBIN TOTAL: 0.7 mg/dL (ref 0.0–1.2)
PROTEIN TOTAL: 6.8 g/dL (ref 6.5–8.3)

## 2017-10-27 LAB — BASIC METABOLIC PANEL
ANION GAP: 9 mmol/L (ref 9–15)
BLOOD UREA NITROGEN: 12 mg/dL (ref 7–21)
BUN / CREAT RATIO: 19
CALCIUM: 9.6 mg/dL (ref 8.5–10.2)
CO2: 28 mmol/L (ref 22.0–30.0)
CREATININE: 0.62 mg/dL (ref 0.60–1.00)
EGFR MDRD AF AMER: >=60 mL/min/{1.73_m2} (ref >=60)
EGFR MDRD NON AF AMER: >=60 mL/min/{1.73_m2} (ref >=60)
GLUCOSE RANDOM: 117 mg/dL — ABNORMAL HIGH (ref 65–99)
POTASSIUM: 4.4 mmol/L (ref 3.5–5.0)
SODIUM: 138 mmol/L (ref 135–145)

## 2017-10-27 LAB — ALT (SGPT): Alanine aminotransferase:CCnc:Pt:Ser/Plas:Qn:: 29

## 2017-10-27 LAB — C-REACTIVE PROTEIN: C reactive protein:MCnc:Pt:Ser/Plas:Qn:: 29 — ABNORMAL HIGH

## 2017-10-27 LAB — PRO-BNP: Natriuretic peptide.B prohormone N-Terminal:MCnc:Pt:Ser/Plas:Qn:: <11

## 2017-10-27 LAB — ANION GAP: Anion gap 3:SCnc:Pt:Ser/Plas:Qn:: 9

## 2017-10-27 LAB — MAGNESIUM: Magnesium:MCnc:Pt:Ser/Plas:Qn:: 1.7

## 2017-10-27 LAB — HYPOCHROMIA

## 2017-10-27 LAB — ERYTHROCYTE SEDIMENTATION RATE: Lab: 9

## 2017-10-27 LAB — GAMMAGLOBULIN; IGG: IgG:MCnc:Pt:Ser/Plas:Qn:: 524 — ABNORMAL LOW

## 2017-10-27 MED ORDER — MYCOPHENOLATE MOFETIL 500 MG TABLET
ORAL_TABLET | Freq: Two times a day (BID) | ORAL | 11 refills | 0 days | Status: CP
Start: 2017-10-27 — End: 2017-11-18

## 2017-10-27 MED ORDER — PREDNISONE 2.5 MG TABLET
ORAL_TABLET | Freq: Every day | ORAL | 11 refills | 0 days | Status: CP
Start: 2017-10-27 — End: 2018-07-28

## 2017-10-27 NOTE — Unmapped (Signed)
.  follow up as instructed

## 2017-10-27 NOTE — Unmapped (Signed)
Refering Physician: Dr Kalman Shan, [facility]    CHief Complaint: ILD complicaitons    HPI   61yo women who presented with back pain. She had a CT abdomen done to look for kidney stones and it showed basilar marking. She than fell and had a rotator cuff tear and had an Xray that showed the basilar marking too. She was still asymptomatic until she had surgery on her sholder. 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 She was stared on xeralto and had slowly gotten better. She has no xtrapulm symptoms.  - she lives in a new house  - no pets/birds  - cough is like tickle in back of throat... Did not respond to azithro  - does have heart burn sympotms    Of Note  - she is grieving the loss of her husband who died in 2017-04-04 - her son has CF and has been dealing with substance abuse issues    ROS   - the balance of 10 systems is negative other than noted above     PMH   - ILD  - PE, 09/2016    --> bilateral  - OSA    --> CPAP  - CAD  - Fibromyalgia  - Depression    FH   - son with CF    SH   - non-smoker  - no birds/pets  - new house    MEDS (personally reviewed in EPIC, pertinent meds noted below)     PHYSICAL EXAM   Vitals - Pulse 85  - Temp 36 ??C (96.8 ??F)  - Ht 162.6 cm (5' 4)  - Wt (!) 112.9 kg (249 lb)  - SpO2 98%  - BMI 42.74 kg/m??   Gen - awake, alert, in NAD   Derm - no rash   HEENT - no adenopathy, TMs clear   Vascular - good pulses throughout, no JVP   CV - RRR,no murmers or gallops   Pulm - faint crackles at the bases  Abd - soft, NT, ND, +BS, no hepatosplenomegally   Ext - no edema, no clubbing   Joints - no enlarged joints, no warmth, redness or effusions   Neuro - CN grossly intact, nl gait     RESULTS     Labs (reviewd in EPIC, pertinent values noted below)       PFTs (personally reveiewed and interpreted)     Date: FVC (% Pred) FEV1 (% Pred) FEF25-75(% Pred) DLCO TBBx/Results                            10/27/17  1.70 1.41   76%     08/11/17  1.91  1.57    63%     03/24/17  2.03  1.62  1.97  67%     12/06/16  1.86 (57%)  1.52 (60%)  1.86 (83%)  70%        6 Minute Walk   - walked 355m  - lowest sat was 93% on RA    HRCT Chest (03/2016, OSH, personally reviewed and interpretted)   - faint interstitial markings  - gas trapping  - resolved GG nodule (seen 2 weeks prior on CT abdomen)    HRCT Chest (09/2016, personally reviewed and interpretted)   - worsening bibasilar streaky densitites  - gas trapping    CTA Chest (09/2016,  personally reviewed and interpretted)   - bilateral sub-massive PE with signs of RH strain    A/P   61yo with ILD and PE    ILD  - 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 Less likely NSIP  - PFTs with moderate restriction and mildly reduced DLCO... PFTs improved on cellcept orginially but lower today... Will will bump cellcept to 1500mg  PO BID  - Will decrease prednisone to 7.5mg  QD  - walk with mild but not clinically significant hypoxia  - MCTD w/u negative but full panel not sent  - pH/Mano with no reflux but esophogeal issues... Will rfer to GI  - Needs to work on weight loss and exercise... This will help the most    Sinusitis/PND  - advised neti pott twice daily  - no azithro as did not help  - will bump prednisone to 10mg  QD from 5    PE  - bilatearl and secondary to rotator cuff surgery and estrogen supplmetns  - will reimage and stop xeralto

## 2017-10-27 NOTE — Unmapped (Signed)
Mycophenolic Mofetil dose increase - refill too soon until 02/12 / EMPRX

## 2017-10-28 ENCOUNTER — Encounter: Admit: 2017-10-28 | Discharge: 2017-10-28 | Payer: MEDICARE | Attending: Anesthesiology | Primary: Anesthesiology

## 2017-10-28 ENCOUNTER — Ambulatory Visit: Admit: 2017-10-28 | Discharge: 2017-10-28 | Payer: MEDICARE

## 2017-10-28 DIAGNOSIS — Z9049 Acquired absence of other specified parts of digestive tract: Secondary | ICD-10-CM | POA: Diagnosis not present

## 2017-10-28 DIAGNOSIS — K6389 Other specified diseases of intestine: Secondary | ICD-10-CM | POA: Diagnosis not present

## 2017-10-28 DIAGNOSIS — I10 Essential (primary) hypertension: Secondary | ICD-10-CM | POA: Diagnosis not present

## 2017-10-28 DIAGNOSIS — Z86010 Personal history of colonic polyps: Secondary | ICD-10-CM | POA: Diagnosis not present

## 2017-10-28 DIAGNOSIS — Q399 Congenital malformation of esophagus, unspecified: Secondary | ICD-10-CM | POA: Diagnosis not present

## 2017-10-28 DIAGNOSIS — K319 Disease of stomach and duodenum, unspecified: Secondary | ICD-10-CM | POA: Diagnosis not present

## 2017-10-28 DIAGNOSIS — Z882 Allergy status to sulfonamides status: Secondary | ICD-10-CM | POA: Diagnosis not present

## 2017-10-28 DIAGNOSIS — K259 Gastric ulcer, unspecified as acute or chronic, without hemorrhage or perforation: Secondary | ICD-10-CM | POA: Diagnosis not present

## 2017-10-28 DIAGNOSIS — K3189 Other diseases of stomach and duodenum: Secondary | ICD-10-CM | POA: Diagnosis not present

## 2017-10-28 DIAGNOSIS — Z86718 Personal history of other venous thrombosis and embolism: Secondary | ICD-10-CM | POA: Diagnosis not present

## 2017-10-28 DIAGNOSIS — R1310 Dysphagia, unspecified: Secondary | ICD-10-CM | POA: Diagnosis not present

## 2017-10-28 DIAGNOSIS — K2950 Unspecified chronic gastritis without bleeding: Secondary | ICD-10-CM | POA: Diagnosis not present

## 2017-10-28 DIAGNOSIS — G4733 Obstructive sleep apnea (adult) (pediatric): Secondary | ICD-10-CM | POA: Diagnosis not present

## 2017-10-28 MED ORDER — PANTOPRAZOLE 40 MG TABLET,DELAYED RELEASE
ORAL_TABLET | Freq: Two times a day (BID) | ORAL | 6 refills | 0 days | Status: CP
Start: 2017-10-28 — End: 2017-11-27

## 2017-10-28 NOTE — Unmapped (Signed)
[facility] Refill and Clinical Coordination Note  Medication(s): mycophenolate 500mg     Amber Bush, DOB: [birth year]  Phone: [phone] (home) , Alternate phone contact: N/A  Shipping address: [address]  [place] [address]  Phone or address changes today?: No  All above HIPAA information verified.  Insurance changes? No    Completed refill and clinical call assessment today to schedule patient's medication shipment from the [facility] [phone]).      MEDICATION RECONCILIATION    Confirmed the medication and dosage are correct and have not changed: No, patient reports changes to the regimen as follows: increase to 3 tabs bid... pt aware and expecting new dose    Were there any changes to your medication(s) in the past month:  No, there are no changes reported at this time.    ADHERENCE    Is this medicine transplant or covered by Medicare Part B? Yes.    Mycophenolate Mofetil 500 mg   Quantity filled last month: 120   # of tablets left on hand: 10 days    Did you miss any doses in the past 4 weeks? No missed doses reported.  Adherence counseling provided? Not needed     SIDE EFFECT MANAGEMENT    Are you tolerating your medication?:  Amber Bush reports tolerating the medication.  Side effect management discussed: None      Therapy is appropriate and should be continued.    Evidence of clinical benefit: See Epic note from 10/27/17      FINANCIAL/SHIPPING    Delivery Scheduled: Yes, Expected medication delivery date: 10/31/17   Additional medications refilled: No additional medications/refills needed at this time.    Amber Bush did not have any additional questions at this time.    Delivery address validated in [facility] scheduling system: Yes, address listed above is correct.      We will follow up with patient monthly for standard refill processing and delivery.      Thank you,  Amber Bush   [facility] Specialty Pharmacist

## 2017-10-29 ENCOUNTER — Other Ambulatory Visit: Payer: Self-pay | Admitting: Family Medicine

## 2017-10-29 MED FILL — MYCOPHENOLATE MOFETIL/500MG/TABS: MYCOPHENOLATE MOFETIL/500MG/TABS | 30 days supply | Qty: 180 | Fill #0

## 2017-11-04 DIAGNOSIS — G4733 Obstructive sleep apnea (adult) (pediatric): Secondary | ICD-10-CM | POA: Diagnosis not present

## 2017-11-05 ENCOUNTER — Other Ambulatory Visit: Payer: Self-pay | Admitting: Family Medicine

## 2017-11-06 ENCOUNTER — Emergency Department (HOSPITAL_BASED_OUTPATIENT_CLINIC_OR_DEPARTMENT_OTHER): Payer: BLUE CROSS/BLUE SHIELD

## 2017-11-06 ENCOUNTER — Other Ambulatory Visit: Payer: Self-pay

## 2017-11-06 ENCOUNTER — Emergency Department (HOSPITAL_BASED_OUTPATIENT_CLINIC_OR_DEPARTMENT_OTHER)
Admission: EM | Admit: 2017-11-06 | Discharge: 2017-11-06 | Disposition: A | Discharge status: Home / Self Care | Payer: BLUE CROSS/BLUE SHIELD | Attending: Emergency Medicine | Admitting: Emergency Medicine

## 2017-11-06 ENCOUNTER — Encounter (HOSPITAL_BASED_OUTPATIENT_CLINIC_OR_DEPARTMENT_OTHER): Payer: Self-pay | Admitting: Emergency Medicine

## 2017-11-06 DIAGNOSIS — Z87891 Personal history of nicotine dependence: Secondary | ICD-10-CM | POA: Diagnosis not present

## 2017-11-06 DIAGNOSIS — I10 Essential (primary) hypertension: Secondary | ICD-10-CM | POA: Diagnosis not present

## 2017-11-06 DIAGNOSIS — Z7901 Long term (current) use of anticoagulants: Secondary | ICD-10-CM | POA: Insufficient documentation

## 2017-11-06 DIAGNOSIS — Y92018 Other place in single-family (private) house as the place of occurrence of the external cause: Secondary | ICD-10-CM | POA: Diagnosis not present

## 2017-11-06 DIAGNOSIS — S0990XA Unspecified injury of head, initial encounter: Secondary | ICD-10-CM

## 2017-11-06 DIAGNOSIS — Y9389 Activity, other specified: Secondary | ICD-10-CM | POA: Insufficient documentation

## 2017-11-06 DIAGNOSIS — S9001XA Contusion of right ankle, initial encounter: Secondary | ICD-10-CM | POA: Insufficient documentation

## 2017-11-06 DIAGNOSIS — Z79899 Other long term (current) drug therapy: Secondary | ICD-10-CM | POA: Diagnosis not present

## 2017-11-06 DIAGNOSIS — Y999 Unspecified external cause status: Secondary | ICD-10-CM | POA: Insufficient documentation

## 2017-11-06 DIAGNOSIS — W01198A Fall on same level from slipping, tripping and stumbling with subsequent striking against other object, initial encounter: Secondary | ICD-10-CM | POA: Diagnosis not present

## 2017-11-06 DIAGNOSIS — R51 Headache: Secondary | ICD-10-CM | POA: Diagnosis not present

## 2017-11-06 DIAGNOSIS — S199XXA Unspecified injury of neck, initial encounter: Secondary | ICD-10-CM | POA: Diagnosis not present

## 2017-11-06 DIAGNOSIS — M25562 Pain in left knee: Secondary | ICD-10-CM | POA: Diagnosis not present

## 2017-11-06 DIAGNOSIS — M542 Cervicalgia: Secondary | ICD-10-CM | POA: Diagnosis not present

## 2017-11-06 NOTE — ED Notes (Signed)
ED Provider at bedside. 

## 2017-11-06 NOTE — Discharge Instructions (Signed)
Please read and follow all provided instructions.  Your diagnoses today include:  1. Minor head injury, initial encounter   2. Contusion of right ankle, initial encounter     Tests performed today include:  CT scan of your head and neck that did not show any serious injury.  Vital signs. See below for your results today.   Medications prescribed:   None  Take any prescribed medications only as directed.  Home care instructions:  Follow any educational materials contained in this packet.  BE VERY CAREFUL not to take multiple medicines containing Tylenol (also called acetaminophen). Doing so can lead to an overdose which can damage your liver and cause liver failure and possibly death.   Follow-up instructions: Please follow-up with your primary care provider in the next 3 days for further evaluation of your symptoms if not resolved.   Return instructions:  SEEK IMMEDIATE MEDICAL ATTENTION IF:  There is confusion or drowsiness (although children frequently become drowsy after injury).   You cannot awaken the injured person.   You have more than one episode of vomiting.   You notice dizziness or unsteadiness which is getting worse, or inability to walk.   You have convulsions or unconsciousness.   You experience severe, persistent headaches not relieved by Tylenol.  You cannot use arms or legs normally.   There are changes in pupil sizes. (This is the black center in the colored part of the eye)   There is clear or bloody discharge from the nose or ears.   You have change in speech, vision, swallowing, or understanding.   Localized weakness, numbness, tingling, or change in bowel or bladder control.  You have any other emergent concerns.  Additional Information: You have had a head injury which does not appear to require admission at this time.  Your vital signs today were: BP 128/62 (BP Location: Right Arm)    Pulse 94    Temp 98.3 F (36.8 C)    Resp 18    Ht  5\' 4"  (1.626 m)    Wt 117.9 kg (260 lb)    SpO2 98%    BMI 44.63 kg/m  If your blood pressure (BP) was elevated above 135/85 this visit, please have this repeated by your doctor within one month. --------------

## 2017-11-06 NOTE — ED Triage Notes (Signed)
Patient states that she was at home tripped and fell hitting her head and hurt her right shin and ankle

## 2017-11-06 NOTE — Telephone Encounter (Signed)
Last OV 07/11/17 Fiorinal last filled 09/12/17 #30 with 0

## 2017-11-06 NOTE — ED Provider Notes (Signed)
[facility] DEPART[facility] Provider Note   CSN: [id number] Arrival date & time: 11/06/17  1732     History   Chief Complaint Chief Complaint  Patient presents with  . Fall    HPI Stacey Alvarez is a 62 y.o. female.  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  Patient had a fall from standing when she put her right ankle up on a bed railing.  She fell backwards striking the back of her head.  Her foot became stuck in the bed railing.  She was able to call for help and her neighbor helped extricate her.  She did not seek evaluation immediately.  Since that time she has had worsening frontal headache causing her to come in.  She had nausea initially but no vomiting.  She also has some neck pain.  She reports bruising to her right ankle however she is ambulatory.  No difficulty walking, difficulty talking or behavior changes per family.  No treatments prior to arrival.      Past Medical History:  Diagnosis Date  . Allergy   . Anxiety   . Arthritis    KNEES,BACK,HIPS  . Depression   . Fibromyalgia   . GERD (gastroesophageal reflux disease)   . Hyperlipidemia   . Hypertension   . IBS (irritable bowel syndrome)   . Interstitial lung disease ([place])   . Sleep apnea     Patient Active Problem List   Diagnosis Date Noted  . DVT of lower extremity, bilateral ([place]) 10/09/2016  . Bilateral pulmonary embolism ([place]) 10/07/2016  . ILD (interstitial lung disease) ([place]) 10/01/2016  . Atherosclerosis of native coronary artery without angina pectoris 10/01/2016  . Chronic cough 04/05/2016  . Incidental lung nodule, > 40mm and < 72mm 04/05/2016  . History of sleep apnea 04/05/2016  . Back pain 02/07/2016  . Severe obesity (BMI >= 40) ([place]) 11/24/2015  . Hyperlipidemia 05/24/2015  . Diverticulitis 01/09/2015  . Routine general medical  examination at a health care facility 11/21/2014  . Sinusitis, acute maxillary 07/28/2014  . UTI (urinary tract infection) 05/19/2014  . Dysuria 04/22/2014  . Vaginitis and vulvovaginitis 04/22/2014  . HTN (hypertension) 10/28/2013  . OSA (obstructive sleep apnea) 10/28/2013  . Insulin resistance 10/28/2013  . GERD (gastroesophageal reflux disease) 10/28/2013  . Depression with anxiety 10/28/2013  . Postmenopausal HRT (hormone replacement therapy) 10/28/2013    Past Surgical History:  Procedure Laterality Date  . CHOLECYSTECTOMY    . COLONOSCOPY    . PARTIAL HYSTERECTOMY  1999  . POLYPECTOMY      OB History    No data available       Home Medications    Prior to Admission medications   Medication Sig Start Date End Date Taking? Authorizing Provider  ALPRAZolam Duanne Moron) 1 MG tablet 0.5 mg in the am and 2mg  at night 11/01/14   [provider]  amLODipine (NORVASC) 5 MG tablet TAKE 1/2 IN THE MORNING AND 1 TABLET(5 MG) BY MOUTH AT BEDTIME 10/21/17   Skeet Latch, MD  butalbital-aspirin-caffeine-codeine [facility] WITH CODEINE) [phone] MG capsule TAKE 1 CAPSULE BY MOUTH EVERY 6 HOURS AS NEEDED FOR PAIN 11/06/17   Midge Minium, MD  cetirizine (ZYRTEC) 10 MG tablet Take 10 mg by mouth daily.    [provider]  cyclobenzaprine (FLEXERIL) 10 MG tablet TAKE 1 TABLET(10 MG) BY MOUTH THREE TIMES DAILY AS  NEEDED FOR MUSCLE SPASMS 10/22/17   Midge Minium, MD  fenofibrate 160 MG tablet TAKE 1 TABLET(160 MG) BY MOUTH DAILY 09/03/17   Midge Minium, MD  FLUoxetine (PROZAC) 20 MG tablet Take 40 mg by mouth daily.    [provider]  gabapentin (NEURONTIN) 600 MG tablet TK 1 TO 2 TS PO QHS 04/23/17   [provider]  lamoTRIgine (LAMICTAL) 100 MG tablet Take 100 mg by mouth daily.    [provider]  lisinopril (PRINIVIL,ZESTRIL) 20 MG tablet TAKE 1 TABLET(20 MG) BY MOUTH TWICE DAILY 09/12/17   Midge Minium, MD  meclizine  (ANTIVERT) 25 MG tablet Take 1 tablet (25 mg total) by mouth 3 (three) times daily as needed for dizziness. 03/12/17   Midge Minium, MD  mycophenolate (CELLCEPT) 500 MG tablet Take 1,500 mg by mouth 2 (two) times daily.  07/23/17   [provider]  OXYGEN Inhale 2 L into the lungs as needed (SOB).    [provider]  pantoprazole (PROTONIX) 40 MG tablet TAKE ONE TABLET BY MOUTH DAILY Patient taking differently: TAKE ONE TABLET BY MOUTH 2 times daily 09/26/17   Midge Minium, MD  pravastatin (PRAVACHOL) 20 MG tablet TAKE 1 TABLET(20 MG) BY MOUTH DAILY 09/22/17   Midge Minium, MD  predniSONE (DELTASONE) 10 MG tablet Take 5 mg by mouth daily. 04/05/17   [provider]  promethazine (PHENERGAN) 25 MG tablet TAKE 1 TABLET(25 MG) BY MOUTH EVERY 8 HOURS AS NEEDED FOR NAUSEA OR VOMITING 10/30/17   Midge Minium, MD  rivaroxaban (XARELTO) 20 MG TABS tablet Take 1 tablet (20 mg total) by mouth daily with supper. 11/05/16   Brand Males, MD  temazepam (RESTORIL) 30 MG capsule Take 30 mg by mouth at bedtime.    [provider]    Family History Family History  Problem Relation Age of Onset  . Diabetes Mother   . Heart disease Mother   . Anxiety disorder Mother   . Depression Mother   . Kidney disease Mother   . Diabetes Father   . Heart disease Father   . Anxiety disorder Father   . Depression Father   . Diabetes Paternal Grandmother   . Cystic fibrosis Son   . Alcohol abuse Son   . Breast cancer Maternal Aunt     Social History Social History   Tobacco Use  . Smoking status: Former Research scientist (life sciences)  . Smokeless tobacco: Never Used  . Tobacco comment: smoked 2 years in late teens about 8-10 cigs daily.   Substance Use Topics  . Alcohol use: No    Alcohol/week: 0.0 oz    Comment: OCC  . Drug use: No     Allergies   Macrobid [nitrofurantoin monohyd macro] and Sulfa antibiotics   Review of Systems Review of Systems  Constitutional:  Negative for fatigue.  HENT: Negative for tinnitus.   Eyes: Negative for photophobia, pain and visual disturbance.  Respiratory: Negative for shortness of breath.   Cardiovascular: Negative for chest pain.  Gastrointestinal: Negative for nausea and vomiting.  Musculoskeletal: Positive for arthralgias. Negative for back pain, gait problem and neck pain.  Skin: Positive for color change. Negative for wound.  Neurological: Positive for headaches. Negative for dizziness, weakness, light-headedness and numbness.  Psychiatric/Behavioral: Negative for confusion and decreased concentration.     Physical Exam Updated Vital Signs BP (!) 154/83 (BP Location: Left Arm)   Pulse (!) 102   Temp 98.3 F (36.8 C)  Resp (!) 24   Ht 5\' 4"  (1.626 m)   Wt 117.9 kg (260 lb)   SpO2 100%   BMI 44.63 kg/m   Physical Exam  Constitutional: She is oriented to person, place, and time. She appears well-developed and well-nourished.  HENT:  Head: Normocephalic and atraumatic. Head is without raccoon's eyes and without Battle's sign.  Right Ear: Tympanic membrane, external ear and ear canal normal. No hemotympanum.  Left Ear: Tympanic membrane, external ear and ear canal normal. No hemotympanum.  Nose: Nose normal. No nasal septal hematoma.  Mouth/Throat: Uvula is midline, oropharynx is clear and moist and mucous membranes are normal.  Eyes: Conjunctivae, EOM and lids are normal. Pupils are equal, round, and reactive to light. Right eye exhibits no nystagmus. Left eye exhibits no nystagmus.  No visible hyphema noted  Neck: Normal range of motion. Neck supple.  Cardiovascular: Normal rate and regular rhythm.  Pulmonary/Chest: Effort normal and breath sounds normal.  Abdominal: Soft. There is no tenderness.  Musculoskeletal: She exhibits no edema.       Cervical back: She exhibits tenderness (Mild, base of neck). She exhibits normal range of motion and no bony tenderness.       Thoracic back: She exhibits  no tenderness and no bony tenderness.       Lumbar back: She exhibits no tenderness and no bony tenderness.  Circumferential bruising about the right ankle however patient with only mild tenderness and full range of motion of the foot and ankle.  Do not suspect fracture.  Distal CMS is intact.  Neurological: She is alert and oriented to person, place, and time. She has normal strength and normal reflexes. No cranial nerve deficit or sensory deficit. Coordination normal. GCS eye subscore is 4. GCS verbal subscore is 5. GCS motor subscore is 6.  Skin: Skin is warm and dry.  Psychiatric: She has a normal mood and affect.  Nursing note and vitals reviewed.    ED Treatments / Results  Labs (all labs ordered are listed, but only abnormal results are displayed) Labs Reviewed - No data to display  EKG  EKG Interpretation None       Radiology Ct Head Wo Contrast  Result Date: 11/06/2017 CLINICAL DATA:  Fall today injuring back of head with posterior neck pain. Patient on blood thinners. EXAM: CT HEAD WITHOUT CONTRAST CT CERVICAL SPINE WITHOUT CONTRAST TECHNIQUE: Multidetector CT imaging of the head and cervical spine was performed following the standard protocol without intravenous contrast. Multiplanar CT image reconstructions of the cervical spine were also generated. COMPARISON:  None. FINDINGS: CT HEAD FINDINGS Brain: Ventricles, cisterns and other CSF spaces are within normal. There is no mass, mass effect, shift of midline structures or acute hemorrhage. There is no evidence of acute infarction. Vascular: No hyperdense vessel or unexpected calcification. Skull: Normal. Negative for fracture or focal lesion. Sinuses/Orbits: No acute finding. Other: None. CT CERVICAL SPINE FINDINGS Alignment: Normal. Skull base and vertebrae: Vertebral body heights are normal. There is mild spondylosis throughout the cervical spine. The atlantoaxial articulation is normal. Non fusion of the midline posterior arch  of C1. Minimal facet arthropathy is present. No significant neural foraminal narrowing. No evidence of acute fracture. Soft tissues and spinal canal: No prevertebral fluid or swelling. No visible canal hematoma. Disc levels:  Within normal. Upper chest: Negative. Other: None. IMPRESSION: Normal head CT. No acute cervical spine injury. Mild spondylosis of the cervical spine. Electronically Signed   By: Marin Olp M.D.   On:  11/06/2017 18:53   Ct Cervical Spine Wo Contrast  Result Date: 11/06/2017 CLINICAL DATA:  Fall today injuring back of head with posterior neck pain. Patient on blood thinners. EXAM: CT HEAD WITHOUT CONTRAST CT CERVICAL SPINE WITHOUT CONTRAST TECHNIQUE: Multidetector CT imaging of the head and cervical spine was performed following the standard protocol without intravenous contrast. Multiplanar CT image reconstructions of the cervical spine were also generated. COMPARISON:  None. FINDINGS: CT HEAD FINDINGS Brain: Ventricles, cisterns and other CSF spaces are within normal. There is no mass, mass effect, shift of midline structures or acute hemorrhage. There is no evidence of acute infarction. Vascular: No hyperdense vessel or unexpected calcification. Skull: Normal. Negative for fracture or focal lesion. Sinuses/Orbits: No acute finding. Other: None. CT CERVICAL SPINE FINDINGS Alignment: Normal. Skull base and vertebrae: Vertebral body heights are normal. There is mild spondylosis throughout the cervical spine. The atlantoaxial articulation is normal. Non fusion of the midline posterior arch of C1. Minimal facet arthropathy is present. No significant neural foraminal narrowing. No evidence of acute fracture. Soft tissues and spinal canal: No prevertebral fluid or swelling. No visible canal hematoma. Disc levels:  Within normal. Upper chest: Negative. Other: None. IMPRESSION: Normal head CT. No acute cervical spine injury. Mild spondylosis of the cervical spine. Electronically Signed   By:  Marin Olp M.D.   On: 11/06/2017 18:53    Procedures Procedures (including critical care time)  Medications Ordered in ED Medications - No data to display   Initial Impression / Assessment and Plan / ED Course  I have reviewed the triage vital signs and the nursing notes.  Pertinent labs & imaging results that were available during my care of the patient were reviewed by me and considered in my medical decision making (see chart for details).     Patient seen and examined. Work-up initiated.  Vital signs reviewed and are as follows: BP (!) 154/83 (BP Location: Left Arm)   Pulse (!) 102   Temp 98.3 F (36.8 C)   Resp (!) 24   Ht 5\' 4"  (1.626 m)   Wt 117.9 kg (260 lb)   SpO2 100%   BMI 44.63 kg/m   7:47 PM head imaging reviewed by myself.  Read does not demonstrate any acute injuries.  Patient updated.  We discussed signs and symptoms of concussion and need to follow-up if these occur.  Patient counseled to return if they have weakness in their arms or legs, slurred speech, trouble walking or talking, confusion, trouble with their balance, or if they have any other concerns. Patient verbalizes understanding and agrees with plan.    Final Clinical Impressions(s) / ED Diagnoses   Final diagnoses:  Minor head injury, initial encounter  Contusion of right ankle, initial encounter   Head injury: Patient on blood thinners.  Imaging of brain and cervical spine without acute findings.  No neck fracture.  No focal neurological deficits.  Patient with stable headache.  ED Discharge Orders    None       Carlisle Cater, Hershal Coria 11/06/17 1949    Malvin Johns, MD 11/06/17 [phone]

## 2017-11-07 ENCOUNTER — Ambulatory Visit: Payer: BLUE CROSS/BLUE SHIELD | Admitting: Family Medicine

## 2017-11-07 ENCOUNTER — Other Ambulatory Visit: Payer: Self-pay

## 2017-11-07 ENCOUNTER — Encounter: Payer: Self-pay | Admitting: Family Medicine

## 2017-11-07 VITALS — BP 118/82 | HR 88 | Temp 97.9°F | Ht 64.0 in | Wt 253.8 lb

## 2017-11-07 DIAGNOSIS — K146 Glossodynia: Secondary | ICD-10-CM | POA: Diagnosis not present

## 2017-11-07 LAB — B12 AND FOLATE PANEL
Folate: 12.7 ng/mL
Vitamin B-12: 359 pg/mL (ref 200–1100)

## 2017-11-07 MED ORDER — FLUCONAZOLE 150 MG PO TABS: 150.0000 mg | ORAL_TABLET | Freq: Once | ORAL | 0 refills | Status: AC

## 2017-11-07 NOTE — Patient Instructions (Addendum)
Please follow up if symptoms do not improve or as needed.   Please start using an oral mouth antiseptic nightly.  Take one course of anti yeast medication to ensure this is not due to thrush given your chronic prednisone use.   We will check your vitamin B12 and folate levels and call you if they are low.   Check with your dentist for a complete oral exam to ensure no other lesions or sores are identified.   Return if symptoms persist.

## 2017-11-07 NOTE — Progress Notes (Signed)
Subjective  CC:  Chief Complaint  Patient presents with  . Mouth Lesions    Patient states that she has lesions on the front and top of her toungue     HPI: Stacey Alvarez is a 62 y.o. female who presents to the office today to address the problems listed above in the chief complaint.  Acute care visit for 2 weeks of burning in mouth/tongue and bleeding with eating. Tongue feels sore. No lesion. No trauma. No active bleeding. Suffers from dry mouth due to medication side effects. On multiple chronic medications for multiple medical conditions including chronic prednisone, xarelto, and cellcept. She denies f/c/s, myalgias, arthralgias or dysphagia. No new foods, allergy sxs, or hives. No open sore. Otherwise feels at her baseline.   I reviewed the patients updated PMH, FH, and SocHx.    Patient Active Problem List   Diagnosis Date Noted  . DVT of lower extremity, bilateral ([place]) 10/09/2016  . Bilateral pulmonary embolism ([place]) 10/07/2016  . ILD (interstitial lung disease) ([place]) 10/01/2016  . Atherosclerosis of native coronary artery without angina pectoris 10/01/2016  . Chronic cough 04/05/2016  . Incidental lung nodule, > 19mm and < 42mm 04/05/2016  . History of sleep apnea 04/05/2016  . Back pain 02/07/2016  . Severe obesity (BMI >= 40) ([place]) 11/24/2015  . Hyperlipidemia 05/24/2015  . Diverticulitis 01/09/2015  . Routine general medical examination at a health care facility 11/21/2014  . Sinusitis, acute maxillary 07/28/2014  . UTI (urinary tract infection) 05/19/2014  . Dysuria 04/22/2014  . Vaginitis and vulvovaginitis 04/22/2014  . HTN (hypertension) 10/28/2013  . OSA (obstructive sleep apnea) 10/28/2013  . Insulin resistance 10/28/2013  . GERD (gastroesophageal reflux disease) 10/28/2013  . Depression with anxiety 10/28/2013  . Postmenopausal HRT (hormone replacement therapy) 10/28/2013   No outpatient medications have been marked as taking for the 11/07/17 encounter  (Office Visit) with Leamon Arnt, MD.    Allergies: Patient is allergic to macrobid [nitrofurantoin monohyd macro] and sulfa antibiotics. Family History: Patient family history includes Alcohol abuse in her son; Anxiety disorder in her father and mother; Breast cancer in her maternal aunt; Cystic fibrosis in her son; Depression in her father and mother; Diabetes in her father, mother, and paternal grandmother; Heart disease in her father and mother; Kidney disease in her mother. Social History:  Patient  reports that she has quit smoking. she has never used smokeless tobacco. She reports that she does not drink alcohol or use drugs.  Review of Systems: Constitutional: Negative for fever malaise or anorexia Cardiovascular: negative for chest pain Respiratory: negative for SOB or persistent cough Gastrointestinal: negative for abdominal pain  Objective  Vitals: Wt 253 lb 12.8 oz (115.1 kg)   BMI 43.56 kg/m  General: no acute distress , A&Ox3 HEENT: [place]ER[place], conjunctiva normal, Oropharynx with dry mucosa; tongue with papillary hyperplasia but no sores, lesions, leukoplakia or ulcers. No bleeding; gums are recessing but not inflamed or swollen Assessment  Tongue pain     Plan   Tongue pain/burning:  Differential dx is long; possibly due to thrush - RX for diflucan given. Start oral cleansing rinse nightly. rec exam by dentist as well. Check for vitamin deficiencies with labs.  Supportive care for dry mouth. If persists, return and consider burning tongue syndrome.   Follow up: prn    Commons side effects, risks, benefits, and alternatives for medications and treatment plan prescribed today were discussed, and the patient expressed understanding of the given instructions. Patient is instructed  to call or message via MyChart if he/she has any questions or concerns regarding our treatment plan. No barriers to understanding were identified. We discussed Red Flag symptoms and signs in detail.  Patient expressed understanding regarding what to do in case of urgent or emergency type symptoms.   Medication list was reconciled, printed and provided to the patient in AVS. Patient instructions and summary information was reviewed with the patient as documented in the AVS. This note was prepared with assistance of Dragon voice recognition software. Occasional wrong-word or sound-a-like substitutions may have occurred due to the inherent limitations of voice recognition software  No orders of the defined types were placed in this encounter.  No orders of the defined types were placed in this encounter.

## 2017-11-08 ENCOUNTER — Encounter: Payer: Self-pay | Admitting: Family Medicine

## 2017-11-10 ENCOUNTER — Telehealth: Payer: Self-pay | Admitting: Family Medicine

## 2017-11-10 NOTE — Telephone Encounter (Signed)
Result note from Dr. Jonni Sanger read to patient, verbalizes understanding, will start OTC B12 1000 mg QD. Also would like Dr. Jonni Sanger to know symptoms seen for previously are "greatly improved."  Result note was not routed to [facility].

## 2017-11-11 ENCOUNTER — Other Ambulatory Visit: Payer: Self-pay | Admitting: Emergency Medicine

## 2017-11-18 MED ORDER — MYCOPHENOLATE MOFETIL 500 MG TABLET
ORAL_TABLET | Freq: Two times a day (BID) | ORAL | 11 refills | 0.00000 days | Status: CP
Start: 2017-11-18 — End: 2018-03-16

## 2017-11-18 NOTE — Unmapped (Signed)
[facility] Refill and Clinical Coordination Note  Medication(s): mycophenolate    Amber Bush, DOB: 11/10/1955  Phone: [phone] (home) , Alternate phone contact: N/A  Shipping address: [address]  [place] [address]  Phone or address changes today?: No  All above HIPAA information verified.  Insurance changes? No    Completed refill and clinical call assessment today to schedule patient's medication shipment from the [facility] [phone]).      MEDICATION RECONCILIATION    Confirmed the medication and dosage are correct and have not changed: No, patient reports changes to the regimen as follows: patient states MD told her to back down to 1000mg  po bid... requesting new rx from md    Were there any changes to your medication(s) in the past month:  No, there are no changes reported at this time.    ADHERENCE    Is this medicine transplant or covered by Medicare Part B? No.    Did you miss any doses in the past 4 weeks? No missed doses reported.  Adherence counseling provided? Not needed     SIDE EFFECT MANAGEMENT    Are you tolerating your medication?:  Amber Bush reports side effects of dry mouth, aftertaste issues, etc..  Side effect management discussed: reduce to the 1000mg  po bid per your doctor's orders and report any additional side effects      Therapy is appropriate and should be continued.    Evidence of clinical benefit: See Epic note from 10/27/17      FINANCIAL/SHIPPING    Delivery Scheduled: Yes, Expected medication delivery date: 11/27/17   Additional medications refilled: No additional medications/refills needed at this time.    The patient will receive an FSI print out for each medication shipped and additional FDA Medication Guides as required.  Patient education from [place] or Robet Leu may also be included in the shipment.    Amber Bush did not have any additional questions at this time.    Delivery address validated in FSI scheduling system: Yes, address listed above is correct.      We will follow up with patient monthly for standard refill processing and delivery.      Thank you,  [name]   [facility] Specialty Pharmacist

## 2017-11-20 DIAGNOSIS — M25562 Pain in left knee: Secondary | ICD-10-CM | POA: Diagnosis not present

## 2017-11-26 MED FILL — MYCOPHENOLATE MOFE/500MG/TABS: MYCOPHENOLATE MOFE/500MG/TABS | 30 days supply | Qty: 120 | Fill #0

## 2017-11-27 MED ORDER — RIVAROXABAN 20 MG TABLET
ORAL_TABLET | Freq: Every day | ORAL | 2 refills | 0.00000 days | Status: CP
Start: 2017-11-27 — End: 2018-10-30

## 2017-11-29 ENCOUNTER — Other Ambulatory Visit: Payer: Self-pay | Admitting: Family Medicine

## 2017-12-02 DIAGNOSIS — G4733 Obstructive sleep apnea (adult) (pediatric): Secondary | ICD-10-CM | POA: Diagnosis not present

## 2017-12-04 ENCOUNTER — Encounter: Payer: Self-pay | Admitting: Family Medicine

## 2017-12-05 ENCOUNTER — Encounter: Payer: Self-pay | Admitting: Cardiovascular Disease

## 2017-12-05 ENCOUNTER — Telehealth: Payer: Self-pay

## 2017-12-05 NOTE — Telephone Encounter (Signed)
Per email:Dr. Oval Linsey, Dr. Birdie Riddle had originally prescribed the norvasc for me. You increased it to7 1/2 mg when I saw you. I am in need of a 90 day refill, and Dr. Birdie Riddle said there was a note in my chart to contact you for this request. I use [facility] on [address]. In [place], [place].  Thank you for your help.  Stacey Alvarez  DOB - 04/11/1956  Phone [phone]  Per LOV:  Blood pressure remains elevated.  Start amloidipine 2.5mg  daily and continue 5mg  qhs.  Continue lisinopril Replied to email to make sure to call pharmacy as they should have refills available

## 2017-12-08 ENCOUNTER — Other Ambulatory Visit: Payer: Self-pay | Admitting: Family Medicine

## 2017-12-12 ENCOUNTER — Ambulatory Visit: Admit: 2017-12-12 | Discharge: 2017-12-12 | Payer: MEDICARE

## 2017-12-12 DIAGNOSIS — I82413 Acute embolism and thrombosis of femoral vein, bilateral: Secondary | ICD-10-CM | POA: Diagnosis not present

## 2017-12-12 DIAGNOSIS — I517 Cardiomegaly: Secondary | ICD-10-CM | POA: Diagnosis not present

## 2017-12-12 DIAGNOSIS — R918 Other nonspecific abnormal finding of lung field: Secondary | ICD-10-CM | POA: Diagnosis not present

## 2017-12-12 DIAGNOSIS — Z86711 Personal history of pulmonary embolism: Secondary | ICD-10-CM | POA: Diagnosis not present

## 2017-12-16 NOTE — Unmapped (Signed)
[facility] Refill Coordination Note    Medication(s) to be Shipped:   MYCOPHENOLATE 500MG  TAB  Other medications to be shipped:       Amber Bush, DOB: 12/21/1955  Phone: [phone] (home) [phone] (work)  Shipping Address: [address]  [place] [address]    All above HIPAA information was verified with patient.     Completed refill call assessment today to schedule patient's medication shipment from the [facility] [phone]).       Specialty medication(s) and dose(s) confirmed: Patient reports changes to the regimen as follows: INCREASED AMLODIPINE 1/2 AM,AND 1 QHS   Changes to medications: Amber Bush reports no changes reported at this time.  Changes to insurance: No  Questions for the pharmacist: No    The patient will receive an FSI print out for each medication shipped and additional FDA Medication Guides as required.  Patient education from [place] or Robet Leu may also be included in the shipment.    DISEASE-SPECIFIC INFORMATION        N/A    ADHERENCE              Is this medicine covered by Medicare Part B? No.         SHIPPING     Shipping address confirmed in FSI.     Delivery Scheduled: Yes, Expected medication delivery date: 041119 via UPS or courier.     [name]   [facility] Specialty

## 2017-12-19 ENCOUNTER — Other Ambulatory Visit: Payer: Self-pay | Admitting: Family Medicine

## 2017-12-25 ENCOUNTER — Encounter (HOSPITAL_COMMUNITY): Payer: Self-pay

## 2017-12-25 ENCOUNTER — Emergency Department (HOSPITAL_COMMUNITY)
Admission: EM | Admit: 2017-12-25 | Discharge: 2017-12-25 | Disposition: A | Discharge status: Home / Self Care | Payer: BLUE CROSS/BLUE SHIELD | Attending: Emergency Medicine | Admitting: Emergency Medicine

## 2017-12-25 ENCOUNTER — Other Ambulatory Visit: Payer: Self-pay

## 2017-12-25 ENCOUNTER — Emergency Department (HOSPITAL_COMMUNITY): Payer: BLUE CROSS/BLUE SHIELD

## 2017-12-25 ENCOUNTER — Ambulatory Visit: Payer: Self-pay

## 2017-12-25 DIAGNOSIS — Z79899 Other long term (current) drug therapy: Secondary | ICD-10-CM | POA: Diagnosis not present

## 2017-12-25 DIAGNOSIS — R531 Weakness: Secondary | ICD-10-CM | POA: Diagnosis not present

## 2017-12-25 DIAGNOSIS — Z7901 Long term (current) use of anticoagulants: Secondary | ICD-10-CM | POA: Insufficient documentation

## 2017-12-25 DIAGNOSIS — Z87891 Personal history of nicotine dependence: Secondary | ICD-10-CM | POA: Insufficient documentation

## 2017-12-25 DIAGNOSIS — I10 Essential (primary) hypertension: Secondary | ICD-10-CM | POA: Insufficient documentation

## 2017-12-25 DIAGNOSIS — R51 Headache: Secondary | ICD-10-CM | POA: Diagnosis not present

## 2017-12-25 LAB — COMPREHENSIVE METABOLIC PANEL
ALBUMIN: 3.5 g/dL (ref 3.5–5.0)
ALT: 27 U/L (ref 14–54)
ANION GAP: 11 (ref 5–15)
AST: 25 U/L (ref 15–41)
Alkaline Phosphatase: 70 U/L (ref 38–126)
BILIRUBIN TOTAL: 0.4 mg/dL (ref 0.3–1.2)
BUN: 9 mg/dL (ref 6–20)
CALCIUM: 8.9 mg/dL (ref 8.9–10.3)
CO2: 23 mmol/L (ref 22–32)
Chloride: 104 mmol/L (ref 101–111)
Creatinine, Ser: 0.71 mg/dL (ref 0.44–1.00)
GFR calc non Af Amer: >60 mL/min (ref >60)
GLUCOSE: 132 mg/dL — AB (ref 65–99)
POTASSIUM: 3.7 mmol/L (ref 3.5–5.1)
SODIUM: 138 mmol/L (ref 135–145)
TOTAL PROTEIN: 6.2 g/dL — AB (ref 6.5–8.1)

## 2017-12-25 LAB — URINALYSIS, ROUTINE W REFLEX MICROSCOPIC
BILIRUBIN URINE: NEGATIVE
Glucose, UA: NEGATIVE mg/dL
KETONES UR: NEGATIVE mg/dL
NITRITE: NEGATIVE
Protein, ur: NEGATIVE mg/dL
SPECIFIC GRAVITY, URINE: 1.002 — AB (ref 1.005–1.030)
pH: 6 (ref 5.0–8.0)

## 2017-12-25 LAB — I-STAT CHEM 8, ED
BUN: 8 mg/dL (ref 6–20)
CALCIUM ION: 1.11 mmol/L — AB (ref 1.15–1.40)
CHLORIDE: 102 mmol/L (ref 101–111)
Creatinine, Ser: 0.6 mg/dL (ref 0.44–1.00)
GLUCOSE: 133 mg/dL — AB (ref 65–99)
HCT: 33 % — ABNORMAL LOW (ref 36.0–46.0)
Hemoglobin: 11.2 g/dL — ABNORMAL LOW (ref 12.0–15.0)
Potassium: 3.7 mmol/L (ref 3.5–5.1)
SODIUM: 139 mmol/L (ref 135–145)
TCO2: 25 mmol/L (ref 22–32)

## 2017-12-25 LAB — CBC
HCT: 35 % — ABNORMAL LOW (ref 36.0–46.0)
Hemoglobin: 11 g/dL — ABNORMAL LOW (ref 12.0–15.0)
MCH: 26.3 pg (ref 26.0–34.0)
MCHC: 31.4 g/dL (ref 30.0–36.0)
MCV: 83.5 fL (ref 78.0–100.0)
PLATELETS: 298 10*3/uL (ref 150–400)
RBC: 4.19 MIL/uL (ref 3.87–5.11)
RDW: 14.6 % (ref 11.5–15.5)
WBC: 7.1 10*3/uL (ref 4.0–10.5)

## 2017-12-25 LAB — DIFFERENTIAL
Basophils Absolute: 0 10*3/uL (ref 0.0–0.1)
Basophils Relative: 0 %
EOS ABS: 0.1 10*3/uL (ref 0.0–0.7)
EOS PCT: 2 %
Lymphocytes Relative: 19 %
Lymphs Abs: 1.4 10*3/uL (ref 0.7–4.0)
MONO ABS: 0.3 10*3/uL (ref 0.1–1.0)
Monocytes Relative: 4 %
NEUTROS PCT: 75 %
Neutro Abs: 5.3 10*3/uL (ref 1.7–7.7)

## 2017-12-25 LAB — CBG MONITORING, ED: Glucose-Capillary: 103 mg/dL — ABNORMAL HIGH (ref 65–99)

## 2017-12-25 LAB — PROTIME-INR
INR: 1.05
PROTHROMBIN TIME: 13.6 s (ref 11.4–15.2)

## 2017-12-25 LAB — I-STAT TROPONIN, ED: Troponin i, poc: 0 ng/mL (ref 0.00–0.08)

## 2017-12-25 LAB — APTT: aPTT: 34 seconds (ref 24–36)

## 2017-12-25 MED ORDER — ASPIRIN 81 MG PO CHEW
324.0000 mg | CHEWABLE_TABLET | Freq: Once | ORAL | Status: AC
  Administered 2017-12-25: 324 mg via ORAL
  Filled 2017-12-25: qty 4

## 2017-12-25 NOTE — ED Provider Notes (Addendum)
[facility] EMERGENCY DEPARTMENT Provider Note   CSN: [id number] Arrival date & time: 12/25/17  1112     History   Chief Complaint Chief Complaint  Patient presents with  . Stroke Symptoms    HPI Stacey Alvarez is a 62 y.o. female.  HPI   62 year old female with history of coagulopathy including prior DVT and PE who was on Xarelto presenting for evaluation of strokelike symptoms.  Patient report last night, she developed headache, tingling sensation to both her hands and feet, having some blurry vision, as well as slurring her words.  She went to sleep and this morning his symptoms resolved.  She did reach out to her primary care office for a follow-up but after talking to the receptionist, they encourage patient to come to the ER for further evaluation of her condition.  At this time patient does not have any specific complaint.  No complaining of any headache, diplopia, loss of vision, trouble speaking or slurring of the words, confusion, chest pain, trouble breathing, abdominal pain, back pain, focal numbness or weakness, urinary symptoms.  Daughter did mention that patient having been eating well lately.  States she only eats some Nabs crackers for dinner last night.  Patient did have history of DVT and PE and was on Xarelto but the medication was discontinued 4 days ago by her pulmonologist as they felt she is no longer needing the medication.  It was thought that her blood clots likely secondary to prolonged hormone use.  She denies any prior history of stroke.  She does admits occasional bouts of feeling depressed due to loss of her longtime husband a year ago.  She does not like to cook on her own.    Past Medical History:  Diagnosis Date  . Allergy   . Anxiety   . Arthritis    KNEES,BACK,HIPS  . Depression   . Fibromyalgia   . GERD (gastroesophageal reflux disease)   . Hyperlipidemia   . Hypertension   . IBS (irritable bowel syndrome)   . Interstitial  lung disease ([place])   . Sleep apnea     Patient Active Problem List   Diagnosis Date Noted  . DVT of lower extremity, bilateral ([place]) 10/09/2016  . Bilateral pulmonary embolism ([place]) 10/07/2016  . ILD (interstitial lung disease) ([place]) 10/01/2016  . Atherosclerosis of native coronary artery without angina pectoris 10/01/2016  . Chronic cough 04/05/2016  . Incidental lung nodule, > 31mm and < 21mm 04/05/2016  . History of sleep apnea 04/05/2016  . Back pain 02/07/2016  . Severe obesity (BMI >= 40) ([place]) 11/24/2015  . Hyperlipidemia 05/24/2015  . Diverticulitis 01/09/2015  . Routine general medical examination at a health care facility 11/21/2014  . Sinusitis, acute maxillary 07/28/2014  . UTI (urinary tract infection) 05/19/2014  . Dysuria 04/22/2014  . Vaginitis and vulvovaginitis 04/22/2014  . HTN (hypertension) 10/28/2013  . OSA (obstructive sleep apnea) 10/28/2013  . Insulin resistance 10/28/2013  . GERD (gastroesophageal reflux disease) 10/28/2013  . Depression with anxiety 10/28/2013  . Postmenopausal HRT (hormone replacement therapy) 10/28/2013    Past Surgical History:  Procedure Laterality Date  . CHOLECYSTECTOMY    . COLONOSCOPY    . PARTIAL HYSTERECTOMY  1999  . POLYPECTOMY       OB History   None      Home Medications    Prior to Admission medications   Medication Sig Start Date End Date Taking? Authorizing Provider  ALPRAZolam Duanne Moron) 1 MG tablet 0.5 mg  in the am and 2mg  at night 11/01/14   [provider]  amLODipine (NORVASC) 5 MG tablet TAKE 1/2 IN THE MORNING AND 1 TABLET(5 MG) BY MOUTH AT BEDTIME 10/21/17   Skeet Latch, MD  Biotin 10 MG CAPS Take by mouth.    [provider]  butalbital-aspirin-caffeine-codeine Acquanetta Chain WITH CODEINE) [id number] MG capsule TAKE 1 CAPSULE BY MOUTH EVERY 6 HOURS AS NEEDED FOR PAIN 11/06/17   Midge Minium, MD  cetirizine (ZYRTEC) 10 MG tablet Take 10 mg by mouth daily.    [provider]  cyclobenzaprine (FLEXERIL) 10 MG tablet TAKE 1 TABLET(10 MG) BY MOUTH THREE TIMES DAILY AS NEEDED FOR MUSCLE SPASMS 10/22/17   Midge Minium, MD  DOCOSAHEXAENOIC ACID PO Take 1,280 mg by mouth.    [provider]  fenofibrate 160 MG tablet TAKE 1 TABLET(160 MG) BY MOUTH DAILY 12/01/17   Midge Minium, MD  FLUoxetine (PROZAC) 20 MG tablet Take 60 mg by mouth daily.     [provider]  gabapentin (NEURONTIN) 600 MG tablet TK 1 TO 2 TS PO QHS 04/23/17   [provider]  lamoTRIgine (LAMICTAL) 100 MG tablet Take 100 mg by mouth daily.    [provider]  lisinopril (PRINIVIL,ZESTRIL) 20 MG tablet TAKE 1 TABLET(20 MG) BY MOUTH TWICE DAILY 12/08/17   Midge Minium, MD  meclizine (ANTIVERT) 25 MG tablet Take 1 tablet (25 mg total) by mouth 3 (three) times daily as needed for dizziness. 03/12/17   Midge Minium, MD  mycophenolate (CELLCEPT) 500 MG tablet Take 1,500 mg by mouth 2 (two) times daily.  07/23/17   [provider]  OXYGEN Inhale 2 L into the lungs as needed (SOB).    [provider]  pantoprazole (PROTONIX) 40 MG tablet TAKE ONE TABLET BY MOUTH DAILY Patient taking differently: TAKE ONE TABLET BY MOUTH 2 times daily 09/26/17   Midge Minium, MD  pravastatin (PRAVACHOL) 20 MG tablet TAKE 1 TABLET(20 MG) BY MOUTH DAILY 12/19/17   Midge Minium, MD  predniSONE (DELTASONE) 10 MG tablet Take 7.5 mg by mouth daily.  04/05/17   [provider]  promethazine (PHENERGAN) 25 MG tablet TAKE 1 TABLET(25 MG) BY MOUTH EVERY 8 HOURS AS NEEDED FOR NAUSEA OR VOMITING 10/30/17   Midge Minium, MD  rivaroxaban (XARELTO) 20 MG TABS tablet Take 1 tablet (20 mg total) by mouth daily with supper. 11/05/16   Brand Males, MD  temazepam (RESTORIL) 30 MG capsule Take 30 mg by mouth at bedtime.    [provider]  traMADol (ULTRAM) 50 MG tablet TK 1 T  PO  Q 6 H PRF PAIN 11/06/17   [provider]     Family History Family History  Problem Relation Age of Onset  . Diabetes Mother   . Heart disease Mother   . Anxiety disorder Mother   . Depression Mother   . Kidney disease Mother   . Diabetes Father   . Heart disease Father   . Anxiety disorder Father   . Depression Father   . Diabetes Paternal Grandmother   . Cystic fibrosis Son   . Alcohol abuse Son   . Breast cancer Maternal Aunt     Social History Social History   Tobacco Use  . Smoking status: Former Research scientist (life sciences)  . Smokeless tobacco: Never Used  . Tobacco comment: smoked 2 years in late teens about 8-10 cigs daily.   Substance Use Topics  .  Alcohol use: No    Alcohol/week: 0.0 oz    Comment: OCC  . Drug use: No     Allergies   Macrobid [nitrofurantoin monohyd macro] and Sulfa antibiotics   Review of Systems Review of Systems  All other systems reviewed and are negative.    Physical Exam Updated Vital Signs BP 126/75   Pulse 80   Temp 98.2 F (36.8 C) (Oral)   Resp 18   SpO2 98%   Physical Exam  Constitutional: She is oriented to person, place, and time. She appears well-developed and well-nourished. No distress.  HENT:  Head: Atraumatic.  Eyes: Conjunctivae are normal.  Neck: Neck supple.  Cardiovascular: Normal rate and regular rhythm.  Pulmonary/Chest: Effort normal and breath sounds normal.  Abdominal: Soft. Bowel sounds are normal. She exhibits no distension. There is no tenderness.  Musculoskeletal:  5/5 strength to all 4 extremities with intact distal pulses  Neurological: She is alert and oriented to person, place, and time.  Neurologic exam:  Speech clear, pupils equal round reactive to light, extraocular movements intact  Normal peripheral visual fields Cranial nerves III through XII normal including no facial droop Follows commands, moves all extremities x4, normal strength to bilateral upper and lower extremities at all major muscle groups including grip Sensation normal to  light touch and pinprick Coordination intact, no limb ataxia, finger-nose-finger normal Rapid alternating movements normal No pronator drift Gait normal   Skin: No rash noted.  Psychiatric: She has a normal mood and affect.  Nursing note and vitals reviewed.    ED Treatments / Results  Labs (all labs ordered are listed, but only abnormal results are displayed) Labs Reviewed  CBC - Abnormal; Notable for the following components:      Result Value   Hemoglobin 11.0 (*)    HCT 35.0 (*)    All other components within normal limits  COMPREHENSIVE METABOLIC PANEL - Abnormal; Notable for the following components:   Glucose, Bld 132 (*)    Total Protein 6.2 (*)    All other components within normal limits  CBG MONITORING, ED - Abnormal; Notable for the following components:   Glucose-Capillary 103 (*)    All other components within normal limits  I-STAT CHEM 8, ED - Abnormal; Notable for the following components:   Glucose, Bld 133 (*)    Calcium, Ion 1.11 (*)    Hemoglobin 11.2 (*)    HCT 33.0 (*)    All other components within normal limits  PROTIME-INR  APTT  DIFFERENTIAL  URINALYSIS, ROUTINE W REFLEX MICROSCOPIC  I-STAT TROPONIN, ED    EKG None  Date: 12/25/2017  Rate: 86  Rhythm: normal sinus rhythm  QRS Axis: normal  Intervals: normal  ST/T Wave abnormalities: normal  Conduction Disutrbances: none  Narrative Interpretation:   Old EKG Reviewed: No significant changes noted     Radiology Ct Head Wo Contrast  Result Date: 12/25/2017 CLINICAL DATA:  Right arm leg and weakness for 1 day, initial encounter EXAM: CT HEAD WITHOUT CONTRAST TECHNIQUE: Contiguous axial images were obtained from the base of the skull through the vertex without intravenous contrast. COMPARISON:  11/06/2017 FINDINGS: Brain: No evidence of acute infarction, hemorrhage, hydrocephalus, extra-axial collection or mass lesion/mass effect. Vascular: No hyperdense vessel or unexpected  calcification. Skull: Normal. Negative for fracture or focal lesion. Sinuses/Orbits: No acute finding. Other: None. IMPRESSION: No acute intracranial abnormality noted. Electronically Signed   By: Inez Catalina M.D.   On: 12/25/2017 12:57    Procedures  Procedures (including critical care time)  Medications Ordered in ED Medications  aspirin chewable tablet 324 mg (has no administration in time range)     Initial Impression / Assessment and Plan / ED Course  I have reviewed the triage vital signs and the nursing notes.  Pertinent labs & imaging results that were available during my care of the patient were reviewed by me and considered in my medical decision making (see chart for details).     BP 128/68   Pulse 80   Temp 98.2 F (36.8 C)   Resp 18   SpO2 97%    Final Clinical Impressions(s) / ED Diagnoses   Final diagnoses:  Weakness    ED Discharge Orders    None     2:54 PM This patient came in with complaints of mild headache Tingling sensation to all 4 extremities, slight blurred vision and slurring of her speech last night.  Her symptoms completely resolved.  She has no focal neuro deficit on exam.  It is unlikely that she has TIA sxs.  I did discussed with Dr. Maryan Rued and with pt.  Pt does not want to be hospitalize for TIA work up.  Her sxs not consistent with TIA.  Her labs are reassuring, normal Head CT scan.  No hypoglycemia.  Agrees to have pt f/u with PCP for further care.  Return if her condition worsen.  Pt currently taking Cellcepts for ILD.    Domenic Moras, PA-C 12/25/17 1523  ADDENDUM: UA resulted showing moderate leukocytes and 6-30 WBC along with many bacteria.  Pt did not report any urinary sxs.  If her condition worsen, may consider abx treatment.   Domenic Moras, PA-C 12/27/17 4287    Blanchie Dessert, MD 12/27/17 2040

## 2017-12-25 NOTE — ED Triage Notes (Signed)
Pt presents with R arm and leg weakness, R facial droop and speech changes with blurred right vision. Started yesterday around 0730. Uncertain of LKW, daughter spoke with patient on the phone at that time. Pt recently taken off blood thinners which she was on for PE and DVT.

## 2017-12-25 NOTE — Telephone Encounter (Signed)
Will keep an eye on pt chart to ensure she is seen.

## 2017-12-25 NOTE — Telephone Encounter (Addendum)
Pt. Reports last night she had some shortness of breath - had to use her home O2.States her "speech was slurred last night" and this morning her handwriting "was not normal." States she is weak and "I don't feel right." Stopped her Xarelto " 4 days ago" as ordered "by one of my doctors." Instructed pt. To have her daughter take her to the ED for evaluation. Verbalizes understanding. Reason for Disposition . Patient sounds very sick or weak to the triager  Answer Assessment - Initial Assessment Questions 1. DESCRIPTION: "Describe how you are feeling."     Very weak, some shortness of breath 2. SEVERITY: "How bad is it?"  "Can you stand and walk?"   - MILD - Feels weak or tired, but does not interfere with work, school or normal activities   - [place] to stand and walk; weakness interferes with work, school, or normal activities   - SEVERE - Unable to stand or walk     Moderate 3. ONSET:  "When did the weakness begin?"     Last night 4. CAUSE: "What do you think is causing the weakness?"     Unsure 5. MEDICINES: "Have you recently started a new medicine or had a change in the amount of a medicine?"     Stopped xarelto 4 days ago 6. OTHER SYMPTOMS: "Do you have any other symptoms?" (e.g., chest pain, fever, cough, SOB, vomiting, diarrhea, bleeding)     Shortness 7. PREGNANCY: "Is there any chance you are pregnant?" "When was your last menstrual period?"     No  Protocols used: WEAKNESS (GENERALIZED) AND FATIGUE-A-AH

## 2017-12-25 NOTE — ED Notes (Signed)
Pt stable, ambulatory, and verbalizes understanding of d/c instructions.    Pt tried multiple times to sign, another employee in chart and would not allow pt to access signature pad.

## 2017-12-25 NOTE — Discharge Instructions (Addendum)
Please follow up with your primary care provider for further evaluation of your condition.  Return if you have any concerns.

## 2017-12-25 NOTE — Telephone Encounter (Signed)
Pt is currently at the ED

## 2017-12-25 NOTE — Telephone Encounter (Signed)
Agree w/ ER disposition as this is concerning for stroke

## 2017-12-29 MED FILL — MYCOPHENOLATE MOFE/500MG/TABS: MYCOPHENOLATE MOFE/500MG/TABS | 30 days supply | Qty: 120 | Fill #1

## 2018-01-02 DIAGNOSIS — G4733 Obstructive sleep apnea (adult) (pediatric): Secondary | ICD-10-CM | POA: Diagnosis not present

## 2018-01-14 NOTE — Unmapped (Signed)
[facility] Refill Coordination Note    Specialty Medication(s) to be Shipped:   General Specialty: mycophenolate 500mg     Other medication(s) to be shipped:       Amber Bush, DOB: September 12, 1956  Phone: [phone] (home) [phone] (work)  Shipping Address: [address]  [place] [address]    All above HIPAA information was verified with patient.     Completed refill call assessment today to schedule patient's medication shipment from the [facility] [phone]).       Specialty medication(s) and dose(s) confirmed: Regimen is correct and unchanged.   Changes to medications: Amber Bush reports starting the following medications: PREDNISONE,OTC  Changes to insurance: No  Questions for the pharmacist: No    The patient will receive an FSI print out for each medication shipped and additional FDA Medication Guides as required.  Patient education from [place] or Robet Leu may also be included in the shipment.    DISEASE-SPECIFIC INFORMATION        N/A    ADHERENCE              MEDICARE PART B DOCUMENTATION         SHIPPING     Shipping address confirmed in FSI.     Delivery Scheduled: Yes, Expected medication delivery date: [phone] via UPS or courier.     Amber Bush   [facility] Specialty Technician

## 2018-01-15 ENCOUNTER — Other Ambulatory Visit: Payer: Self-pay | Admitting: Family Medicine

## 2018-01-16 NOTE — Telephone Encounter (Signed)
Last OV 11/07/17, No future OV  Last filled Fiorinal 11/06/17, # 30 with 0 refill  Last filled Phenergan 10/30/17, #45 with 0 refills

## 2018-01-19 DIAGNOSIS — G4733 Obstructive sleep apnea (adult) (pediatric): Secondary | ICD-10-CM | POA: Diagnosis not present

## 2018-01-20 ENCOUNTER — Ambulatory Visit: Payer: Self-pay | Admitting: Cardiovascular Disease

## 2018-01-26 MED FILL — MYCOPHENOLATE MOFE/500MG/TABS: MYCOPHENOLATE MOFE/500MG/TABS | 30 days supply | Qty: 120 | Fill #2

## 2018-02-01 DIAGNOSIS — G4733 Obstructive sleep apnea (adult) (pediatric): Secondary | ICD-10-CM | POA: Diagnosis not present

## 2018-02-11 ENCOUNTER — Encounter: Payer: Self-pay | Admitting: Family Medicine

## 2018-02-13 ENCOUNTER — Encounter: Payer: Self-pay | Admitting: Family Medicine

## 2018-02-13 MED ORDER — CIPROFLOXACIN HCL 500 MG PO TABS: 500.0000 mg | ORAL_TABLET | Freq: Two times a day (BID) | ORAL | 0 refills | Status: DC

## 2018-02-25 ENCOUNTER — Other Ambulatory Visit: Payer: Self-pay

## 2018-02-25 ENCOUNTER — Encounter: Payer: Self-pay | Admitting: Family Medicine

## 2018-02-25 ENCOUNTER — Ambulatory Visit: Payer: BLUE CROSS/BLUE SHIELD | Admitting: Family Medicine

## 2018-02-25 VITALS — BP 124/82 | HR 81 | Temp 97.8°F | Resp 16 | Ht 64.0 in | Wt 246.4 lb

## 2018-02-25 DIAGNOSIS — E559 Vitamin D deficiency, unspecified: Secondary | ICD-10-CM

## 2018-02-25 DIAGNOSIS — E538 Deficiency of other specified B group vitamins: Secondary | ICD-10-CM | POA: Diagnosis not present

## 2018-02-25 DIAGNOSIS — M797 Fibromyalgia: Secondary | ICD-10-CM

## 2018-02-25 DIAGNOSIS — F331 Major depressive disorder, recurrent, moderate: Secondary | ICD-10-CM | POA: Diagnosis not present

## 2018-02-25 DIAGNOSIS — M79605 Pain in left leg: Secondary | ICD-10-CM | POA: Diagnosis not present

## 2018-02-25 DIAGNOSIS — M79604 Pain in right leg: Secondary | ICD-10-CM | POA: Diagnosis not present

## 2018-02-25 DIAGNOSIS — R7301 Impaired fasting glucose: Secondary | ICD-10-CM

## 2018-02-25 LAB — CBC WITH DIFFERENTIAL/PLATELET
BASOS ABS: 0.1 10*3/uL (ref 0.0–0.1)
Basophils Relative: 0.7 % (ref 0.0–3.0)
Eosinophils Absolute: 0.1 10*3/uL (ref 0.0–0.7)
Eosinophils Relative: 1.7 % (ref 0.0–5.0)
HEMATOCRIT: 37.7 % (ref 36.0–46.0)
Hemoglobin: 12.4 g/dL (ref 12.0–15.0)
LYMPHS PCT: 15.7 % (ref 12.0–46.0)
Lymphs Abs: 1.3 10*3/uL (ref 0.7–4.0)
MCHC: 33 g/dL (ref 30.0–36.0)
MCV: 80.7 fl (ref 78.0–100.0)
MONO ABS: 0.4 10*3/uL (ref 0.1–1.0)
Monocytes Relative: 4.3 % (ref 3.0–12.0)
NEUTROS ABS: 6.6 10*3/uL (ref 1.4–7.7)
NEUTROS PCT: 77.6 % — AB (ref 43.0–77.0)
PLATELETS: 337 10*3/uL (ref 150.0–400.0)
RBC: 4.67 Mil/uL (ref 3.87–5.11)
RDW: 15.3 % (ref 11.5–15.5)
WBC: 8.5 10*3/uL (ref 4.0–10.5)

## 2018-02-25 LAB — TSH: TSH: 1.04 u[IU]/mL (ref 0.35–4.50)

## 2018-02-25 LAB — POCT GLYCOSYLATED HEMOGLOBIN (HGB A1C): HEMOGLOBIN A1C: 5.6 % (ref 4.0–5.6)

## 2018-02-25 LAB — VITAMIN B12: VITAMIN B 12: 301 pg/mL (ref 211–911)

## 2018-02-25 LAB — VITAMIN D 25 HYDROXY (VIT D DEFICIENCY, FRACTURES): VITD: 28.01 ng/mL — AB (ref 30.00–100.00)

## 2018-02-25 NOTE — Patient Instructions (Addendum)
Please schedule a follow up appointment with Dr. Birdie Riddle  in 2-3 weeks for recheck   Please schedule a follow up appointment with Dr. Robina Ade for your Depression   Work on getting more sleep.   Use medications that you have to manage your pain.   Restless Legs Syndrome Restless legs syndrome is a condition that causes uncomfortable feelings or sensations in the legs, especially while sitting or lying down. The sensations usually cause an overwhelming urge to move the legs. The arms can also sometimes be affected. The condition can range from mild to severe. The symptoms often interfere with a person's ability to sleep. What are the causes? The cause of this condition is not known. What increases the risk? This condition is more likely to develop in:  People who are older than age 78.  Pregnant women. In general, restless legs syndrome is more common in women than in men.  People who have a family history of the condition.  People who have certain medical conditions, such as iron deficiency, kidney disease, Parkinson disease, or nerve damage.  People who take certain medicines, such as medicines for high blood pressure, nausea, colds, allergies, depression, and some heart conditions.  What are the signs or symptoms? The main symptom of this condition is uncomfortable sensations in the legs. These sensations may be:  Described as pulling, tingling, prickling, throbbing, crawling, or burning.  Worse while you are sitting or lying down.  Worse during periods of rest or inactivity.  Worse at night, often interfering with your sleep.  Accompanied by a very strong urge to move your legs.  Temporarily relieved by movement of your legs.  The sensations usually affect both sides of the body. The arms can also be affected, but this is rare. People who have this condition often have tiredness during the day because of their lack of sleep at night. How is this diagnosed? This condition may  be diagnosed based on your description of the symptoms. You may also have tests, including blood tests, to check for other conditions that may lead to your symptoms. In some cases, you may be asked to spend some time in a sleep lab so your sleeping can be monitored. How is this treated? Treatment for this condition is focused on managing the symptoms. Treatment may include:  Self-help and lifestyle changes.  Medicines.  Follow these instructions at home:  Take medicines only as directed by your health care provider.  Try these methods to get temporary relief from the uncomfortable sensations: ? Massage your legs. ? Walk or stretch. ? Take a cold or hot bath.  Practice good sleep habits. For example, go to bed and get up at the same time every day.  Exercise regularly.  Practice ways of relaxing, such as yoga or meditation.  Avoid caffeine and alcohol.  Do not use any tobacco products, including cigarettes, chewing tobacco, or electronic cigarettes. If you need help quitting, ask your health care provider.  Keep all follow-up visits as directed by your health care provider. This is important. Contact a health care provider if: Your symptoms do not improve with treatment, or they get worse. This information is not intended to replace advice given to you by your health care provider. Make sure you discuss any questions you have with your health care provider. Document Released: 08/23/2002 Document Revised: 02/08/2016 Document Reviewed: 08/29/2014 Elsevier Interactive Patient Education  [employer].

## 2018-02-25 NOTE — Progress Notes (Signed)
Subjective  CC:  Chief Complaint  Patient presents with  . Leg Pain    bilateral leg pain, patient states that her whole body hurts, leg pain keeps her up at night, going on for over a week    HPI: Stacey Alvarez is a 62 y.o. female who presents to the office today to address the problems listed above in the chief complaint.  Acute care visit today.  Patient reports that she is in a lot of pain.  Describes generalized full body aching worse in bilateral lower extremities.  Pain is aching and constant.  Unrelieved with over-the-counter medicines, and even her narcotic pain medicine is not helping.  She reports she is not sleeping well.  During the interview she does breakdown.  One year anniversary of the death of her spouse was a few weeks ago.  She has not been doing well.  Continues to grieve.  She is going to counseling.  She has known chronic mood disorder treated by Dr. Robina Ade.  She is on multiple mood and sleep medicines.  She does have a history of fibromyalgia.  She denies fevers, chills, sweats, change in breathing, unilateral calf pain, swollen or warm joints.  No GI symptoms.  She was recently taken off her blood thinners per pulmonology.  Assessment  1. Fibromyalgia   2. Moderate recurrent major depression ([place])   3. IFG (impaired fasting glucose)   4. Vitamin B12 deficiency   5. Vitamin D deficiency   6. Pain in both lower extremities      Plan   Pain most consistent with fibromyalgia flare: Education given.  Recommend working on getting better sleep to help alleviate pain cycle.  Also recommend seeing a psychiatrist to help with mood medications.  No new medications prescribed today.  Check for vitamin deficiencies that she has had these in the past.  Check thyroid.  Follow-up with primary care MD, she is overdue for visit.  Impaired fasting glucose is stable  Follow up: Return in about 2 weeks (around 03/11/2018) for recheck with Dr. Birdie Riddle.   Orders Placed This Encounter    Procedures  . CBC with Differential/Platelet  . TSH  . VITAMIN D 25 Hydroxy (Vit-D Deficiency, Fractures)  . Vitamin B12  . POCT glycosylated hemoglobin (Hb A1C)   No orders of the defined types were placed in this encounter.     I reviewed the patients updated PMH, FH, and SocHx.    Patient Active Problem List   Diagnosis Date Noted  . DVT of lower extremity, bilateral ([place]) 10/09/2016  . Bilateral pulmonary embolism ([place]) 10/07/2016  . ILD (interstitial lung disease) ([place]) 10/01/2016  . Atherosclerosis of native coronary artery without angina pectoris 10/01/2016  . Chronic cough 04/05/2016  . Incidental lung nodule, > 43mm and < 4mm 04/05/2016  . History of sleep apnea 04/05/2016  . Back pain 02/07/2016  . Severe obesity (BMI >= 40) ([place]) 11/24/2015  . Hyperlipidemia 05/24/2015  . Diverticulitis 01/09/2015  . Routine general medical examination at a health care facility 11/21/2014  . Sinusitis, acute maxillary 07/28/2014  . UTI (urinary tract infection) 05/19/2014  . Dysuria 04/22/2014  . Vaginitis and vulvovaginitis 04/22/2014  . HTN (hypertension) 10/28/2013  . OSA (obstructive sleep apnea) 10/28/2013  . Insulin resistance 10/28/2013  . GERD (gastroesophageal reflux disease) 10/28/2013  . Depression with anxiety 10/28/2013  . Postmenopausal HRT (hormone replacement therapy) 10/28/2013   Current Meds  Medication Sig  . ALPRAZolam (XANAX) 1 MG tablet 0.5 mg in  the am and 2mg  at night  . amLODipine (NORVASC) 5 MG tablet TAKE 1/2 IN THE MORNING AND 1 TABLET(5 MG) BY MOUTH AT BEDTIME  . Biotin 10 MG CAPS Take by mouth.  . butalbital-aspirin-caffeine-codeine (FIORINAL WITH CODEINE) 50-325-40-30 MG capsule TAKE 1 CAPSULE BY MOUTH EVERY 6 HOURS AS NEEDED FOR PAIN  . cetirizine (ZYRTEC) 10 MG tablet Take 10 mg by mouth daily.  . cyclobenzaprine (FLEXERIL) 10 MG tablet TAKE 1 TABLET(10 MG) BY MOUTH THREE TIMES DAILY AS NEEDED FOR MUSCLE SPASMS  . fenofibrate 160 MG tablet  TAKE 1 TABLET(160 MG) BY MOUTH DAILY  . FLUoxetine (PROZAC) 20 MG tablet Take 60 mg by mouth daily.   . fluticasone (FLONASE) 50 MCG/ACT nasal spray Place 1 spray into both nostrils daily.  Marland Kitchen lamoTRIgine (LAMICTAL) 100 MG tablet Take 100 mg by mouth daily.  Marland Kitchen lisinopril (PRINIVIL,ZESTRIL) 20 MG tablet TAKE 1 TABLET(20 MG) BY MOUTH TWICE DAILY  . mycophenolate (CELLCEPT) 500 MG tablet Take 500 mg by mouth 2 (two) times daily.   . Omega-3 1000 MG CAPS Take by mouth.  . OXYGEN Inhale 2 L into the lungs as needed (SOB).  . pantoprazole (PROTONIX) 40 MG tablet TAKE ONE TABLET BY MOUTH DAILY (Patient taking differently: No sig reported)  . pravastatin (PRAVACHOL) 20 MG tablet TAKE 1 TABLET(20 MG) BY MOUTH DAILY  . predniSONE (DELTASONE) 10 MG tablet Take 5 mg by mouth daily with breakfast.   . promethazine (PHENERGAN) 25 MG tablet TAKE 1 TABLET(25 MG) BY MOUTH EVERY 8 HOURS AS NEEDED FOR NAUSEA OR VOMITING  . temazepam (RESTORIL) 30 MG capsule Take 30 mg by mouth at bedtime.  . traMADol (ULTRAM) 50 MG tablet TK 1 T  PO  Q 6 H PRF PAIN    Allergies: Patient is allergic to macrobid [nitrofurantoin monohyd macro] and sulfa antibiotics. Family History: Patient family history includes Alcohol abuse in her son; Anxiety disorder in her father and mother; Breast cancer in her maternal aunt; Cystic fibrosis in her son; Depression in her father and mother; Diabetes in her father, mother, and paternal grandmother; Heart disease in her father and mother; Kidney disease in her mother. Social History:  Patient  reports that she has quit smoking. She has never used smokeless tobacco. She reports that she does not drink alcohol or use drugs.  Review of Systems: Constitutional: Negative for fever malaise or anorexia Cardiovascular: negative for chest pain Respiratory: negative for SOB or persistent cough Gastrointestinal: negative for abdominal pain  Objective  Vitals: BP 124/82   Pulse 81   Temp 97.8 F  (36.6 C) (Oral)   Resp 16   Ht 5\' 4"  (1.626 m)   Wt 246 lb 6.4 oz (111.8 kg)   SpO2 95%   BMI 42.29 kg/m  General: no acute distress but crying throughout interview, A&Ox3 HEENT: PEERL, conjunctiva normal, Oropharynx moist,neck is supple Cardiovascular:  RRR without murmur or gallop.  Respiratory:  Good breath sounds bilaterally, CTAB with normal respiratory effort Skin:  Warm, no rashes Musculoskeletal: Diffuse muscular tenderness, positive trigger points on back anterior thighs and lower extremities.  Joints appear normal.  Normal gait.     Commons side effects, risks, benefits, and alternatives for medications and treatment plan prescribed today were discussed, and the patient expressed understanding of the given instructions. Patient is instructed to call or message via MyChart if he/she has any questions or concerns regarding our treatment plan. No barriers to understanding were identified. We discussed Red Flag symptoms and  signs in detail. Patient expressed understanding regarding what to do in case of urgent or emergency type symptoms.   Medication list was reconciled, printed and provided to the patient in AVS. Patient instructions and summary information was reviewed with the patient as documented in the AVS. This note was prepared with assistance of Dragon voice recognition software. Occasional wrong-word or sound-a-like substitutions may have occurred due to the inherent limitations of voice recognition software

## 2018-02-26 ENCOUNTER — Encounter: Payer: Self-pay | Admitting: Family Medicine

## 2018-02-26 MED ORDER — TRAMADOL HCL 50 MG PO TABS: 50.0000 mg | ORAL_TABLET | Freq: Four times a day (QID) | ORAL | 0 refills | Status: DC | PRN

## 2018-03-01 ENCOUNTER — Other Ambulatory Visit: Payer: Self-pay | Admitting: Cardiovascular Disease

## 2018-03-04 DIAGNOSIS — F410 Panic disorder [episodic paroxysmal anxiety] without agoraphobia: Secondary | ICD-10-CM | POA: Diagnosis not present

## 2018-03-04 DIAGNOSIS — F331 Major depressive disorder, recurrent, moderate: Secondary | ICD-10-CM | POA: Diagnosis not present

## 2018-03-04 DIAGNOSIS — G4733 Obstructive sleep apnea (adult) (pediatric): Secondary | ICD-10-CM | POA: Diagnosis not present

## 2018-03-09 ENCOUNTER — Ambulatory Visit: Admit: 2018-03-09 | Discharge: 2018-03-09 | Payer: MEDICARE | Attending: Internal Medicine | Primary: Internal Medicine

## 2018-03-09 ENCOUNTER — Ambulatory Visit: Admit: 2018-03-09 | Discharge: 2018-03-09 | Payer: MEDICARE

## 2018-03-09 DIAGNOSIS — G8918 Other acute postprocedural pain: Secondary | ICD-10-CM | POA: Diagnosis not present

## 2018-03-09 DIAGNOSIS — G4733 Obstructive sleep apnea (adult) (pediatric): Secondary | ICD-10-CM | POA: Diagnosis not present

## 2018-03-09 DIAGNOSIS — J849 Interstitial pulmonary disease, unspecified: Secondary | ICD-10-CM | POA: Diagnosis not present

## 2018-03-09 DIAGNOSIS — F329 Major depressive disorder, single episode, unspecified: Secondary | ICD-10-CM | POA: Diagnosis not present

## 2018-03-09 DIAGNOSIS — M549 Dorsalgia, unspecified: Secondary | ICD-10-CM | POA: Diagnosis not present

## 2018-03-09 DIAGNOSIS — M75100 Unspecified rotator cuff tear or rupture of unspecified shoulder, not specified as traumatic: Secondary | ICD-10-CM | POA: Diagnosis not present

## 2018-03-09 DIAGNOSIS — Z6841 Body Mass Index (BMI) 40.0 and over, adult: Secondary | ICD-10-CM | POA: Diagnosis not present

## 2018-03-09 DIAGNOSIS — M797 Fibromyalgia: Secondary | ICD-10-CM | POA: Diagnosis not present

## 2018-03-09 DIAGNOSIS — R0602 Shortness of breath: Secondary | ICD-10-CM | POA: Diagnosis not present

## 2018-03-09 DIAGNOSIS — I2510 Atherosclerotic heart disease of native coronary artery without angina pectoris: Secondary | ICD-10-CM | POA: Diagnosis not present

## 2018-03-09 LAB — BASIC METABOLIC PANEL
ANION GAP: 12 mmol/L (ref 9–15)
BLOOD UREA NITROGEN: 12 mg/dL (ref 7–21)
BUN / CREAT RATIO: 20
CALCIUM: 9.5 mg/dL (ref 8.5–10.2)
CHLORIDE: 102 mmol/L (ref 98–107)
CO2: 28 mmol/L (ref 22.0–30.0)
CREATININE: 0.6 mg/dL (ref 0.60–1.00)
EGFR MDRD NON AF AMER: >=60 mL/min/{1.73_m2} (ref >=60)
GLUCOSE RANDOM: 117 mg/dL (ref 65–179)
POTASSIUM: 4.5 mmol/L (ref 3.5–5.0)
SODIUM: 142 mmol/L (ref 135–145)

## 2018-03-09 LAB — GAMMAGLOBULIN; IGG: IgG:MCnc:Pt:Ser/Plas:Qn:: 563 — ABNORMAL LOW

## 2018-03-09 LAB — ERYTHROCYTE SEDIMENTATION RATE: Lab: 14

## 2018-03-09 LAB — CBC W/ AUTO DIFF
BASOPHILS ABSOLUTE COUNT: 0 10*9/L (ref 0.0–0.1)
BASOPHILS RELATIVE PERCENT: 0.5 %
EOSINOPHILS ABSOLUTE COUNT: 0.1 10*9/L (ref 0.0–0.4)
HEMATOCRIT: 34.5 % — ABNORMAL LOW (ref 36.0–46.0)
HEMOGLOBIN: 11.3 g/dL — ABNORMAL LOW (ref 12.0–16.0)
LARGE UNSTAINED CELLS: 2 % (ref 0–4)
LYMPHOCYTES ABSOLUTE COUNT: 1.1 10*9/L — ABNORMAL LOW (ref 1.5–5.0)
LYMPHOCYTES RELATIVE PERCENT: 17.6 %
MEAN CORPUSCULAR HEMOGLOBIN CONC: 32.7 g/dL (ref 31.0–37.0)
MEAN CORPUSCULAR HEMOGLOBIN: 26.8 pg (ref 26.0–34.0)
MEAN CORPUSCULAR VOLUME: 81.8 fL (ref 80.0–100.0)
MEAN PLATELET VOLUME: 7.2 fL (ref 7.0–10.0)
MONOCYTES ABSOLUTE COUNT: 0.2 10*9/L (ref 0.2–0.8)
MONOCYTES RELATIVE PERCENT: 3.1 %
NEUTROPHILS ABSOLUTE COUNT: 4.7 10*9/L (ref 2.0–7.5)
NEUTROPHILS RELATIVE PERCENT: 74.9 %
RED BLOOD CELL COUNT: 4.22 10*12/L (ref 4.00–5.20)
RED CELL DISTRIBUTION WIDTH: 15.2 % — ABNORMAL HIGH (ref 12.0–15.0)
WBC ADJUSTED: 6.3 10*9/L (ref 4.5–11.0)

## 2018-03-09 LAB — HEPATIC FUNCTION PANEL
ALKALINE PHOSPHATASE: 62 U/L (ref 38–126)
AST (SGOT): 23 U/L (ref 14–38)
BILIRUBIN DIRECT: <0.1 mg/dL (ref 0.00–0.40)
PROTEIN TOTAL: 7 g/dL (ref 6.5–8.3)

## 2018-03-09 LAB — MAGNESIUM: Magnesium:MCnc:Pt:Ser/Plas:Qn:: 1.8

## 2018-03-09 LAB — PRO-BNP: Natriuretic peptide.B prohormone N-Terminal:MCnc:Pt:Ser/Plas:Qn:: 28.1

## 2018-03-09 LAB — ALT (SGPT): Alanine aminotransferase:CCnc:Pt:Ser/Plas:Qn:: 27

## 2018-03-09 LAB — CREATINE KINASE TOTAL: Creatine kinase:CCnc:Pt:Ser/Plas:Qn:: 56

## 2018-03-09 LAB — C-REACTIVE PROTEIN: C reactive protein:MCnc:Pt:Ser/Plas:Qn:: 31.4 — ABNORMAL HIGH

## 2018-03-09 LAB — CALCIUM: Calcium:MCnc:Pt:Ser/Plas:Qn:: 9.5

## 2018-03-09 LAB — RED CELL DISTRIBUTION WIDTH: Lab: 15.2 — ABNORMAL HIGH

## 2018-03-09 NOTE — Unmapped (Signed)
Refering Physician: Dr Kalman Shan, [facility]    CHief Complaint: ILD complicaitons    HPI   61yo women who presented with back pain. She had a CT abdomen done to look for kidney stones and it showed basilar marking. She than fell and had a rotator cuff tear and had an Xray that showed the basilar marking too. She was still asymptomatic until she had surgery on her sholder. 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 She was stared on xeralto and had slowly gotten better. She has no xtrapulm symptoms.    Of Note  - she is grieving the loss of her husband who died in 2017-03-15 - her son has CF and has been dealing with substance abuse issues    Today  - self decreased prednisone to 2.5mg  QD  - self decreaed celclept tp 500mg  PO BID  - overall, feels well  - mood is better    ROS   - the balance of 10 systems is negative other than noted above     PMH   - ILD  - PE, 09/2016    --> bilateral  - OSA    --> CPAP  - CAD  - Fibromyalgia  - Depression    FH   - son with CF    SH   - non-smoker  - no birds/pets  - new house    MEDS (personally reviewed in EPIC, pertinent meds noted below)     PHYSICAL EXAM   Vitals - BP 141/70 (BP Site: L Arm, BP Position: Sitting, BP Cuff Size: Large)  - Pulse 81  - Temp 36.7 ??C (98.1 ??F)  - Resp 16  - Ht 163 cm (5' 4.17)  - Wt (!) 111.6 kg (246 lb)  - SpO2 95%  - BMI 42.00 kg/m??   Gen - awake, alert, in NAD   Derm - no rash   HEENT - no adenopathy, TMs clear   Vascular - good pulses throughout, no JVP   CV - RRR,no murmers or gallops   Pulm - faint crackles at the bases  Abd - soft, NT, ND, +BS, no hepatosplenomegally   Ext - no edema, no clubbing   Joints - no enlarged joints, no warmth, redness or effusions   Neuro - CN grossly intact, nl gait     RESULTS     Labs (reviewd in EPIC, pertinent values noted below)       PFTs (personally reveiewed and interpreted)     Date: FVC (% Pred) FEV1 (% Pred) FEF25-75(% Pred) DLCO TBBx/Results                    03/09/18  1.86  1.52   63%     10/27/17  1.70  1.41   76%     08/11/17  1.91  1.57    63%     03/24/17  2.03  1.62  1.97  67%     12/06/16  1.86 (57%)  1.52 (60%)  1.86 (83%)  70%        6 Minute Walk   - walked 364m  - lowest sat was 93% on RA    HRCT Chest (03/2016, OSH, personally reviewed and interpretted)   - faint interstitial markings  - gas trapping  - resolved GG nodule (seen 2 weeks prior on CT abdomen)    HRCT Chest (09/2016,  personally reviewed and interpretted)   - worsening bibasilar streaky densitites  - gas trapping    CTA Chest (09/2016, personally reviewed and interpretted)   - bilateral sub-massive PE with signs of RH strain    A/P   61yo with ILD and PE    ILD  - 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 Less likely NSIP  - PFTs with moderate restriction and mildly reduced DLCO... PFTs up today  - walk with mild but not clinically significant hypoxia  - will continue cellcept 500mg  PO BID  - will continue prednsione 2.5mg  QD... She does not like prednisone and really does not want higher doses  - MCTD w/u negative but full panel not sent  - pH/Mano with no reflux but esophogeal issues... Seeing GI  - Needs to work on weight loss and exercise... This will help the most    Sinusitis/PND  - advised neti pott twice daily  - no azithro as did not help    PE  - bilatearl and secondary to rotator cuff surgery and estrogen supplmetns  - stopped xeralto

## 2018-03-09 NOTE — Unmapped (Signed)
.  follow up as instructed

## 2018-03-13 ENCOUNTER — Encounter: Payer: Self-pay | Admitting: Family Medicine

## 2018-03-13 ENCOUNTER — Ambulatory Visit: Payer: BLUE CROSS/BLUE SHIELD | Admitting: Family Medicine

## 2018-03-13 NOTE — Unmapped (Addendum)
[facility] Refill Coordination Note    Specialty Medication(s) to be Shipped:   General Specialty: mycophenolate 500mg     Other medication(s) to be shipped:       Amber Bush, DOB: [birth year]  Phone: [phone] (home) [phone] (work)  Shipping Address: [address]  [place] [address]    All above HIPAA information was verified with patient.     Completed refill call assessment today to schedule patient's medication shipment from the [facility] [phone]).       Specialty medication(s) and dose(s) confirmed: Regimen is correct and unchanged.   Changes to medications: [name] reports no changes reported at this time.  Changes to insurance: No  Questions for the pharmacist: No    The patient will receive an FSI print out for each medication shipped and additional FDA Medication Guides as required.  Patient education from [facility] or Robet Leu may also be included in the shipment.    DISEASE-SPECIFIC INFORMATION        N/A    ADHERENCE              MEDICARE PART B DOCUMENTATION         SHIPPING     Shipping address confirmed in FSI.     Delivery Scheduled: Yes, Expected medication delivery date: 070319 via UPS or courier.     [name]   [facility] Specialty Technician

## 2018-03-14 ENCOUNTER — Other Ambulatory Visit: Payer: Self-pay | Admitting: Family Medicine

## 2018-03-16 MED ORDER — MYCOPHENOLATE MOFETIL 500 MG TABLET
ORAL_TABLET | Freq: Two times a day (BID) | ORAL | 11 refills | 0 days | Status: CP
Start: 2018-03-16 — End: 2019-03-23
  Filled 2018-05-20: qty 60, 30d supply, fill #0

## 2018-03-17 MED FILL — MYCOPHENOLATE MOFE/500MG/TABS: MYCOPHENOLATE MOFE/500MG/TABS | 30 days supply | Qty: 60 | Fill #0

## 2018-03-25 ENCOUNTER — Other Ambulatory Visit: Payer: Self-pay | Admitting: Family Medicine

## 2018-03-25 MED ORDER — BUTALBITAL-ASA-CAFF-CODEINE 50-325-40-30 MG PO CAPS: 1.0000 | ORAL_CAPSULE | Freq: Four times a day (QID) | ORAL | 0 refills | Status: DC | PRN

## 2018-03-25 MED ORDER — PROMETHAZINE HCL 25 MG PO TABS: 25.0000 mg | ORAL_TABLET | Freq: Three times a day (TID) | ORAL | 0 refills | Status: DC | PRN

## 2018-03-25 NOTE — Telephone Encounter (Signed)
Last OV with [facility] 07/11/17, Last OV with Dr. Jonni Sanger 02/25/18 (Fibromyalgia)  NO OV has been scheduled  Last filled Fiorinal 01/16/18, # 30 with 0 refills  Last filled Phenergan 01/16/18, # 25 with 0 refills

## 2018-04-03 DIAGNOSIS — G4733 Obstructive sleep apnea (adult) (pediatric): Secondary | ICD-10-CM | POA: Diagnosis not present

## 2018-04-08 NOTE — Unmapped (Signed)
[facility] Refill and Clinical Coordination Note  Medication(s): mycophenolate 500mg     Amber Bush, DOB: 1956/07/23  Phone: [phone] (home) [phone] (work), Alternate phone contact: N/A  Shipping address: [address]  [place] [address]  Phone or address changes today?: No  All above HIPAA information verified.  Insurance changes? No    Completed refill and clinical call assessment today to schedule patient's medication shipment from the [facility] [phone]).      MEDICATION RECONCILIATION    Confirmed the medication and dosage are correct and have not changed: Yes, regimen is correct and unchanged.    Were there any changes to your medication(s) in the past month:  No, there are no changes reported at this time.    ADHERENCE    Is this medicine transplant or covered by Medicare Part B? No.    Did you miss any doses in the past 4 weeks? No missed doses reported.  Adherence counseling provided? Not needed     SIDE EFFECT MANAGEMENT    Are you tolerating your medication?:  [name] reports tolerating the medication.  Side effect management discussed: None      Therapy is appropriate and should be continued.    Evidence of clinical benefit: See Epic note from 03/09/18      FINANCIAL/SHIPPING    Delivery Scheduled: Yes, Expected medication delivery date: 04/14/18   Additional medications refilled: No additional medications/refills needed at this time.    The patient will receive an FSI print out for each medication shipped and additional FDA Medication Guides as required.  Patient education from [facility] or [facility]et Leu may also be included in the shipment.    [name] did not have any additional questions at this time.    Delivery address validated in FSI scheduling system: Yes, address listed above is correct.      We will follow up with patient monthly for standard refill processing and delivery.      Thank you,  [name]   [facility] Specialty Pharmacist

## 2018-04-12 MED FILL — MYCOPHENOLATE MOFE/500MG/TABS: MYCOPHENOLATE MOFE/500MG/TABS | 30 days supply | Qty: 60 | Fill #1

## 2018-05-01 ENCOUNTER — Telehealth: Payer: Self-pay

## 2018-05-01 ENCOUNTER — Encounter: Payer: Self-pay | Admitting: Family Medicine

## 2018-05-01 ENCOUNTER — Ambulatory Visit: Payer: BLUE CROSS/BLUE SHIELD | Admitting: Family Medicine

## 2018-05-01 ENCOUNTER — Other Ambulatory Visit: Payer: Self-pay

## 2018-05-01 VITALS — BP 130/84 | HR 76 | Temp 97.6°F | Resp 16 | Ht 64.0 in | Wt 239.4 lb

## 2018-05-01 DIAGNOSIS — J0100 Acute maxillary sinusitis, unspecified: Secondary | ICD-10-CM

## 2018-05-01 MED ORDER — AZITHROMYCIN 250 MG PO TABS: ORAL_TABLET | ORAL | 0 refills | Status: DC

## 2018-05-01 NOTE — Telephone Encounter (Signed)
This is not an appropriate fibromyalgia treatment and not something I would use or prescribe for this.  If pt is having issues with her fibromyalgia, she needs an appt w/ me to discuss

## 2018-05-01 NOTE — Patient Instructions (Addendum)
Please follow up if symptoms do not improve or as needed.   I recommend Mucinex DM (generic is fine) for cough and congestion.  Delsym can also be tried.  Sinusitis, Adult Sinusitis is soreness and inflammation of your sinuses. Sinuses are hollow spaces in the bones around your face. They are located:  Around your eyes.  In the middle of your forehead.  Behind your nose.  In your cheekbones.  Your sinuses and nasal passages are lined with a stringy fluid (mucus). Mucus normally drains out of your sinuses. When your nasal tissues get inflamed or swollen, the mucus can get trapped or blocked so air cannot flow through your sinuses. This lets bacteria, viruses, and funguses grow, and that leads to infection. Follow these instructions at home: Medicines  Take, use, or apply over-the-counter and prescription medicines only as told by your doctor. These may include nasal sprays.  If you were prescribed an antibiotic medicine, take it as told by your doctor. Do not stop taking the antibiotic even if you start to feel better. Hydrate and Humidify  Drink enough water to keep your pee (urine) clear or pale yellow.  Use a cool mist humidifier to keep the humidity level in your home above 50%.  Breathe in steam for 10-15 minutes, 3-4 times a day or as told by your doctor. You can do this in the bathroom while a hot shower is running.  Try not to spend time in cool or dry air. Rest  Rest as much as possible.  Sleep with your head raised (elevated).  Make sure to get enough sleep each night. General instructions  Put a warm, moist washcloth on your face 3-4 times a day or as told by your doctor. This will help with discomfort.  Wash your hands often with soap and water. If there is no soap and water, use hand sanitizer.  Do not smoke. Avoid being around people who are smoking (secondhand smoke).  Keep all follow-up visits as told by your doctor. This is important. Contact a doctor  if:  You have a fever.  Your symptoms get worse.  Your symptoms do not get better within 10 days. Get help right away if:  You have a very bad headache.  You cannot stop throwing up (vomiting).  You have pain or swelling around your face or eyes.  You have trouble seeing.  You feel confused.  Your neck is stiff.  You have trouble breathing. This information is not intended to replace advice given to you by your health care provider. Make sure you discuss any questions you have with your health care provider. Document Released: 02/19/2008 Document Revised: 04/28/2016 Document Reviewed: 06/28/2015 Elsevier Interactive Patient Education  [employer].

## 2018-05-01 NOTE — Progress Notes (Signed)
Subjective   CC:  Chief Complaint  Patient presents with  . Sinus Problem    facial pressure, chills, vomitting, achy, taking OTC allergy but not helping  . Cough    worse at night    HPI: Stacey Alvarez is a 62 y.o. female who presents to the office today to address the problems listed above in the chief complaint.  Patient reports sinus congestion and pressure with thick drainage, mild nonproductive cough, ear pressure without pain, and mild malaise.  Symptoms have been present for 7 days.Shedenies high fevers, GI symptoms, shortness of breath. Shehas had sinus infections in the past and this feels similar.  Patient is a non-smoker.  No history of asthma or COPD.  I reviewed the patients updated PMH, FH, and SocHx.    Patient Active Problem List   Diagnosis Date Noted  . DVT of lower extremity, bilateral ([place]) 10/09/2016  . Bilateral pulmonary embolism ([place]) 10/07/2016  . ILD (interstitial lung disease) ([place]) 10/01/2016  . Atherosclerosis of native coronary artery without angina pectoris 10/01/2016  . Chronic cough 04/05/2016  . Incidental lung nodule, > 63mm and < 64mm 04/05/2016  . History of sleep apnea 04/05/2016  . Back pain 02/07/2016  . Severe obesity (BMI >= 40) ([place]) 11/24/2015  . Hyperlipidemia 05/24/2015  . Diverticulitis 01/09/2015  . Routine general medical examination at a health care facility 11/21/2014  . Sinusitis, acute maxillary 07/28/2014  . UTI (urinary tract infection) 05/19/2014  . Dysuria 04/22/2014  . Vaginitis and vulvovaginitis 04/22/2014  . HTN (hypertension) 10/28/2013  . OSA (obstructive sleep apnea) 10/28/2013  . Insulin resistance 10/28/2013  . GERD (gastroesophageal reflux disease) 10/28/2013  . Depression with anxiety 10/28/2013  . Postmenopausal HRT (hormone replacement therapy) 10/28/2013   Current Meds  Medication Sig  . ALPRAZolam (XANAX) 1 MG tablet 0.5 mg in the am and 2mg  at night  . amLODipine (NORVASC) 5 MG tablet TAKE 1/2  TABLET BY MOUTH IN THE MORNING AND 1 TABLET BY MOUTH AT BEDTIME  . butalbital-aspirin-caffeine-codeine (FIORINAL WITH CODEINE) 50-325-40-30 MG capsule Take 1 capsule by mouth every 6 (six) hours as needed for pain.  . cetirizine (ZYRTEC) 10 MG tablet Take 10 mg by mouth daily.  . fenofibrate 160 MG tablet TAKE 1 TABLET(160 MG) BY MOUTH DAILY  . FLUoxetine (PROZAC) 20 MG tablet Take 40 mg by mouth daily.   . fluticasone (FLONASE) 50 MCG/ACT nasal spray Place 1 spray into both nostrils daily.  Marland Kitchen gabapentin (NEURONTIN) 600 MG tablet PRN  . lamoTRIgine (LAMICTAL) 100 MG tablet Take 100 mg by mouth daily.  Marland Kitchen lisinopril (PRINIVIL,ZESTRIL) 20 MG tablet TAKE 1 TABLET(20 MG) BY MOUTH TWICE DAILY  . meclizine (ANTIVERT) 25 MG tablet Take 1 tablet (25 mg total) by mouth 3 (three) times daily as needed for dizziness.  . mycophenolate (CELLCEPT) 500 MG tablet Take 500 mg by mouth 2 (two) times daily.   . Omega-3 1000 MG CAPS Take by mouth.  . OXYGEN Inhale 2 L into the lungs as needed (SOB).  . pantoprazole (PROTONIX) 40 MG tablet TAKE ONE TABLET BY MOUTH DAILY (Patient taking differently: No sig reported)  . pravastatin (PRAVACHOL) 20 MG tablet TAKE 1 TABLET(20 MG) BY MOUTH DAILY  . predniSONE (DELTASONE) 10 MG tablet Take 2.5 mg by mouth daily with breakfast.   . promethazine (PHENERGAN) 25 MG tablet Take 1 tablet (25 mg total) by mouth every 8 (eight) hours as needed for nausea or vomiting.  . temazepam (RESTORIL) 30 MG capsule  Take 30 mg by mouth at bedtime.    Review of Systems: Cardiovascular: negative for chest pain Respiratory: negative for SOB or persistent cough Gastrointestinal: negative for abdominal pain Genitourinary: negative for dysuria or gross hematuria  Objective  Vitals: BP 130/84   Pulse 76   Temp 97.6 F (36.4 C) (Oral)   Resp 16   Ht 5\' 4"  (1.626 m)   Wt 239 lb 6.4 oz (108.6 kg)   SpO2 97%   BMI 41.09 kg/m  General: no acute distress  Psych:  Alert and oriented, normal  mood and affect HEENT:  Normocephalic, atraumatic, TMs with serous effusions or retraction w/o erythema, nasal mucosa is red with purulent drainage, tender maxillary sinus present, OP mild erythematous w/o eudate, supple neck with LAD Cardiovascular:  RRR without murmur or gallop. no peripheral edema Respiratory:  Good breath sounds bilaterally, CTAB with normal respiratory effort Skin:  Warm, no rashes Neurologic:   Mental status is normal. normal gait  Assessment  1. Acute non-recurrent maxillary sinusitis      Plan    Sinusitis: History and exam is most consistent with bacterial sinus infection.  Etiology and prognosis discussed with patient.  Recommend antibiotics as ordered below.  Patient to complete course of antibiotics, use supportive medications like mucolytics and decongestants as needed.  May use Tylenol or Advil if needed.  Symptoms should improve over the next 2 weeks.  Patient will return or call if symptoms persist or worsen.  Follow up: Return if symptoms worsen or fail to improve.    Commons side effects, risks, benefits, and alternatives for medications and treatment plan prescribed today were discussed, and the patient expressed understanding of the given instructions. Patient is instructed to call or message via MyChart if he/she has any questions or concerns regarding our treatment plan. No barriers to understanding were identified. We discussed Red Flag symptoms and signs in detail. Patient expressed understanding regarding what to do in case of urgent or emergency type symptoms.   Medication list was reconciled, printed and provided to the patient in AVS. Patient instructions and summary information was reviewed with the patient as documented in the AVS. This note was prepared with assistance of Dragon voice recognition software. Occasional wrong-word or sound-a-like substitutions may have occurred due to the inherent limitations of voice recognition software  No  orders of the defined types were placed in this encounter.  Meds ordered this encounter  Medications  . azithromycin (ZITHROMAX) 250 MG tablet    Sig: Take 2 tabs today, then 1 tab daily for 4 days    Dispense:  1 each    Refill:  0

## 2018-05-01 NOTE — Telephone Encounter (Signed)
This was not started by me and I am not sure why she is taking this or requesting it.  I need more info before I can decide if a refill is appropriate

## 2018-05-01 NOTE — Telephone Encounter (Signed)
OV 02/25/18, Fibromyalgia per Dr. Jonni Sanger

## 2018-05-01 NOTE — Telephone Encounter (Signed)
Patient was in the office today and requested a refill of Tramadol.   Last filled 02/26/18, # 30 with 0 refills

## 2018-05-01 NOTE — Telephone Encounter (Signed)
Called pt and left a detailed message per DPR with PCP recommendations to call the office to schedule an appointment so we can help get her fibromyalgia under control.   [place] for [facility] to Discuss results / PCP recommendations / Schedule patient.

## 2018-05-04 DIAGNOSIS — G4733 Obstructive sleep apnea (adult) (pediatric): Secondary | ICD-10-CM | POA: Diagnosis not present

## 2018-05-04 NOTE — Telephone Encounter (Signed)
Closing encounter until pt returns call.

## 2018-05-06 DIAGNOSIS — F4323 Adjustment disorder with mixed anxiety and depressed mood: Secondary | ICD-10-CM | POA: Diagnosis not present

## 2018-05-11 NOTE — Unmapped (Signed)
[facility] Refill Coordination Note    Specialty Medication(s) to be Shipped:   Infectious Disease: Xifaxan    Other medication(s) to be shipped:       Amber Bush, DOB: 10/17/1955  Phone: [phone] (home) [phone] (work)  Shipping Address: [address]  [place] [address]    All above HIPAA information was verified with patient.     Completed refill call assessment today to schedule patient's medication shipment from the [facility] [phone]).       Specialty medication(s) and dose(s) confirmed: Regimen is correct and unchanged.   Changes to medications: [name] reports no changes reported at this time.  Changes to insurance: No  Questions for the pharmacist: No    The patient will receive a drug information handout for each medication shipped and additional FDA Medication Guides as required.      DISEASE/MEDICATION-SPECIFIC INFORMATION        N/A    ADHERENCE              MEDICARE PART B DOCUMENTATION         SHIPPING     Shipping address confirmed in Epic.     Delivery Scheduled: Yes, Expected medication delivery date: 090519 via UPS or courier.     [name]   [facility] Specialty Technician

## 2018-05-12 ENCOUNTER — Encounter: Payer: Self-pay | Admitting: General Practice

## 2018-05-12 ENCOUNTER — Other Ambulatory Visit: Payer: Self-pay | Admitting: Family Medicine

## 2018-05-12 MED ORDER — PROMETHAZINE HCL 25 MG PO TABS: 25.0000 mg | ORAL_TABLET | Freq: Three times a day (TID) | ORAL | 0 refills | Status: DC | PRN

## 2018-05-12 MED ORDER — BUTALBITAL-ASA-CAFF-CODEINE 50-325-40-30 MG PO CAPS: 1.0000 | ORAL_CAPSULE | Freq: Four times a day (QID) | ORAL | 0 refills | Status: DC | PRN

## 2018-05-12 NOTE — Telephone Encounter (Signed)
mychart sent as pt never returned last phone call.

## 2018-05-12 NOTE — Telephone Encounter (Signed)
Last OV 05/01/18 Fiorinal last filled 03/25/18 #30 with 0 pheneregan last filled 03/25/18 #45 with 0

## 2018-05-12 NOTE — Telephone Encounter (Signed)
Pt is overdue for appt w/ me.  Will get #15 of both and that will be last refill w/o appt

## 2018-05-13 DIAGNOSIS — F4323 Adjustment disorder with mixed anxiety and depressed mood: Secondary | ICD-10-CM | POA: Diagnosis not present

## 2018-05-15 ENCOUNTER — Ambulatory Visit: Payer: Self-pay | Admitting: Family Medicine

## 2018-05-20 ENCOUNTER — Ambulatory Visit: Payer: BLUE CROSS/BLUE SHIELD | Admitting: Family Medicine

## 2018-05-20 ENCOUNTER — Other Ambulatory Visit: Payer: Self-pay

## 2018-05-20 ENCOUNTER — Encounter: Payer: Self-pay | Admitting: Family Medicine

## 2018-05-20 VITALS — BP 131/81 | HR 82 | Temp 98.1°F | Resp 16 | Ht 64.0 in | Wt 241.0 lb

## 2018-05-20 DIAGNOSIS — E785 Hyperlipidemia, unspecified: Secondary | ICD-10-CM

## 2018-05-20 DIAGNOSIS — F418 Other specified anxiety disorders: Secondary | ICD-10-CM | POA: Diagnosis not present

## 2018-05-20 DIAGNOSIS — I10 Essential (primary) hypertension: Secondary | ICD-10-CM

## 2018-05-20 DIAGNOSIS — Z23 Encounter for immunization: Secondary | ICD-10-CM

## 2018-05-20 LAB — BASIC METABOLIC PANEL
BUN: 11 mg/dL (ref 6–23)
CHLORIDE: 101 meq/L (ref 96–112)
CO2: 30 meq/L (ref 19–32)
Calcium: 9.9 mg/dL (ref 8.4–10.5)
Creatinine, Ser: 0.72 mg/dL (ref 0.40–1.20)
GFR: 86.54 mL/min (ref >60.00)
Glucose, Bld: 131 mg/dL — ABNORMAL HIGH (ref 70–99)
POTASSIUM: 4.1 meq/L (ref 3.5–5.1)
SODIUM: 140 meq/L (ref 135–145)

## 2018-05-20 LAB — LIPID PANEL
Cholesterol: 263 mg/dL — ABNORMAL HIGH (ref 0–200)
HDL: 54.4 mg/dL (ref >39.00)
LDL Cholesterol: 169 mg/dL — ABNORMAL HIGH (ref 0–99)
NONHDL: 208.58
Total CHOL/HDL Ratio: 5
Triglycerides: 200 mg/dL — ABNORMAL HIGH (ref 0.0–149.0)
VLDL: 40 mg/dL (ref 0.0–40.0)

## 2018-05-20 LAB — HEPATIC FUNCTION PANEL
ALT: 11 U/L (ref 0–35)
AST: 12 U/L (ref 0–37)
Albumin: 4.6 g/dL (ref 3.5–5.2)
Alkaline Phosphatase: 65 U/L (ref 39–117)
BILIRUBIN DIRECT: 0.1 mg/dL (ref 0.0–0.3)
BILIRUBIN TOTAL: 0.5 mg/dL (ref 0.2–1.2)
Total Protein: 7.2 g/dL (ref 6.0–8.3)

## 2018-05-20 LAB — CBC WITH DIFFERENTIAL/PLATELET
BASOS PCT: 0.6 % (ref 0.0–3.0)
Basophils Absolute: 0.1 10*3/uL (ref 0.0–0.1)
EOS ABS: 0.1 10*3/uL (ref 0.0–0.7)
EOS PCT: 0.7 % (ref 0.0–5.0)
HEMATOCRIT: 38.8 % (ref 36.0–46.0)
Hemoglobin: 13.2 g/dL (ref 12.0–15.0)
Lymphocytes Relative: 17.3 % (ref 12.0–46.0)
Lymphs Abs: 1.5 10*3/uL (ref 0.7–4.0)
MCHC: 34.1 g/dL (ref 30.0–36.0)
MCV: 81.3 fl (ref 78.0–100.0)
MONO ABS: 0.3 10*3/uL (ref 0.1–1.0)
Monocytes Relative: 3.5 % (ref 3.0–12.0)
NEUTROS ABS: 6.6 10*3/uL (ref 1.4–7.7)
Neutrophils Relative %: 77.9 % — ABNORMAL HIGH (ref 43.0–77.0)
PLATELETS: 370 10*3/uL (ref 150.0–400.0)
RBC: 4.77 Mil/uL (ref 3.87–5.11)
RDW: 15.1 % (ref 11.5–15.5)
WBC: 8.5 10*3/uL (ref 4.0–10.5)

## 2018-05-20 LAB — TSH: TSH: 0.36 u[IU]/mL (ref 0.35–4.50)

## 2018-05-20 LAB — HEMOGLOBIN A1C: HEMOGLOBIN A1C: 5.4 % (ref 4.6–6.5)

## 2018-05-20 MED FILL — MYCOPHENOLATE MOFETIL 500 MG TABLET: 30 days supply | Qty: 60 | Fill #0 | Status: AC

## 2018-05-20 NOTE — Patient Instructions (Signed)
We'll notify you of your lab results and determine the next steps Follow Dr Starleen Arms instructions and increase the Prozac to 80mg  daily If the Prozac is not effective at the higher dose, please ask Dr Toy Care about switching to Cymbalta (for both mood and pain) If the higher dose of Prozac doesn't improve your pain, we can add Lyrica at any time- just let me know! Continue to work on healthy diet and regular exercise- you can do it! Reach out to those around you- they are there for you! Call with any questions or concerns Hang in there!!

## 2018-05-20 NOTE — Progress Notes (Signed)
   Subjective:    Patient ID: Stacey Alvarez, female    DOB: 05/20/1956, 62 y.o.   MRN: [id number]  HPI HTN- chronic problem, on Amlodipine 2.5mg  QAM and 5mg  QPM and Lisinopril 20mg  daily.  [facility]ined [employer]- is walking regularly.  + migraines.  Hyperlipidemia- chronic problem, on Pravastatin and Fenofibrate daily.  Walking regularly.  Depression- husband died 53 months ago, son is in and out of the hospital due to DM and CF and addiction.  Pt is currently seeing psych- on meds. Has done grief counseling.  'I just feel like crap all the time'.  Pt continues individual counseling. Daughter, cousin, and sister-in-law are all local.  No thoughts of self harm.   Review of Systems For ROS see HPI     Objective:   Physical Exam  Constitutional: She is oriented to person, place, and time. She appears well-developed and well-nourished.  obese  HENT:  Head: Normocephalic and atraumatic.  Eyes: Pupils are equal, round, and reactive to light. Conjunctivae and EOM are normal.  Neck: Normal range of motion. Neck supple. No thyromegaly present.  Cardiovascular: Normal rate, regular rhythm, normal heart sounds and intact distal pulses.  No murmur heard. Pulmonary/Chest: Effort normal and breath sounds normal. No respiratory distress.  Abdominal: Soft. She exhibits no distension. There is no tenderness.  Musculoskeletal: She exhibits no edema.  Lymphadenopathy:    She has no cervical adenopathy.  Neurological: She is alert and oriented to person, place, and time.  Skin: Skin is warm and dry.  Psychiatric:  Pt was tearful and shaking throughout entire 30 minute visit  Vitals reviewed.         Assessment & Plan:

## 2018-05-21 ENCOUNTER — Encounter: Payer: Self-pay | Admitting: Family Medicine

## 2018-05-21 ENCOUNTER — Other Ambulatory Visit: Payer: Self-pay | Admitting: General Practice

## 2018-05-21 MED ORDER — PRAVASTATIN SODIUM 40 MG PO TABS: 40.0000 mg | ORAL_TABLET | Freq: Every day | ORAL | 1 refills | Status: DC

## 2018-05-21 NOTE — Assessment & Plan Note (Signed)
Ongoing issue for pt but I suspect this will not improve until her depression is adequately treated.  Discussed need for healthy diet and regular exercise but pt was not in the appropriate head space to hear me.  Check labs to risk stratify.  Will follow.

## 2018-05-21 NOTE — Assessment & Plan Note (Signed)
Adequate control.  Presumably asymptomatic but difficult to assess as pt indicates 'I just feel like crap' and cries throughout visit.  Stressed need for ongoing stress management, healthy diet, regular exercise.  Check labs.  No anticipated med changes at this time but will follow.

## 2018-05-21 NOTE — Assessment & Plan Note (Signed)
Chronic problem, on Pravastatin and fenofibrate daily.  Check labs.  Adjust meds prn

## 2018-05-21 NOTE — Assessment & Plan Note (Signed)
Deteriorated.  Husband died 74 months ago and pt is really struggling with her grief.  She cried and shook throughout our entire visit.  She is seeing 2 counselors, has done Hospice grief counseling, and has a psychiatrist.  She is so distraught today that I recommended checking into behavioral health.  She is able to contract for safety or I would have pursued involuntary commitment based on her current state.  Encouraged her to call psych ASAP to discuss medication adjustment.  Will follow.

## 2018-05-22 ENCOUNTER — Other Ambulatory Visit: Payer: Self-pay | Admitting: Family Medicine

## 2018-05-22 ENCOUNTER — Encounter: Payer: Self-pay | Admitting: Family Medicine

## 2018-05-22 MED ORDER — LEVOTHYROXINE SODIUM 50 MCG PO TABS: 50.0000 ug | ORAL_TABLET | Freq: Every day | ORAL | 3 refills | Status: DC

## 2018-05-27 ENCOUNTER — Encounter: Payer: Self-pay | Admitting: Family Medicine

## 2018-05-27 DIAGNOSIS — F4323 Adjustment disorder with mixed anxiety and depressed mood: Secondary | ICD-10-CM | POA: Diagnosis not present

## 2018-05-28 NOTE — Telephone Encounter (Signed)
Spoke with patient regarding symptoms and request for studies. Patient states she feels the same she did when experiencing clots in the past, including SOB. Patient advised to go to the Emergency Room for evaluation. Patient verbalized understanding, plans to go to [facility].

## 2018-06-01 DIAGNOSIS — M25562 Pain in left knee: Secondary | ICD-10-CM | POA: Diagnosis not present

## 2018-06-01 DIAGNOSIS — S83242A Other tear of medial meniscus, current injury, left knee, initial encounter: Secondary | ICD-10-CM | POA: Diagnosis not present

## 2018-06-02 ENCOUNTER — Other Ambulatory Visit: Payer: Self-pay | Admitting: Family Medicine

## 2018-06-02 NOTE — Telephone Encounter (Signed)
Last OV 05/20/18 Fiorinal last filled 05/12/18 #15 with 0

## 2018-06-04 ENCOUNTER — Ambulatory Visit: Payer: Self-pay | Admitting: Cardiovascular Disease

## 2018-06-04 DIAGNOSIS — G4733 Obstructive sleep apnea (adult) (pediatric): Secondary | ICD-10-CM | POA: Diagnosis not present

## 2018-06-08 ENCOUNTER — Encounter: Payer: Self-pay | Admitting: Family Medicine

## 2018-06-10 ENCOUNTER — Emergency Department (HOSPITAL_BASED_OUTPATIENT_CLINIC_OR_DEPARTMENT_OTHER): Payer: BLUE CROSS/BLUE SHIELD

## 2018-06-10 ENCOUNTER — Encounter (HOSPITAL_BASED_OUTPATIENT_CLINIC_OR_DEPARTMENT_OTHER): Payer: Self-pay | Admitting: *Deleted

## 2018-06-10 ENCOUNTER — Other Ambulatory Visit: Payer: Self-pay

## 2018-06-10 ENCOUNTER — Ambulatory Visit: Payer: Self-pay | Admitting: *Deleted

## 2018-06-10 ENCOUNTER — Emergency Department (HOSPITAL_BASED_OUTPATIENT_CLINIC_OR_DEPARTMENT_OTHER)
Admission: EM | Admit: 2018-06-10 | Discharge: 2018-06-10 | Disposition: A | Discharge status: Home / Self Care | Payer: BLUE CROSS/BLUE SHIELD | Attending: Emergency Medicine | Admitting: Emergency Medicine

## 2018-06-10 DIAGNOSIS — R0602 Shortness of breath: Secondary | ICD-10-CM | POA: Diagnosis not present

## 2018-06-10 DIAGNOSIS — R609 Edema, unspecified: Secondary | ICD-10-CM | POA: Insufficient documentation

## 2018-06-10 DIAGNOSIS — Z86711 Personal history of pulmonary embolism: Secondary | ICD-10-CM | POA: Insufficient documentation

## 2018-06-10 DIAGNOSIS — I10 Essential (primary) hypertension: Secondary | ICD-10-CM | POA: Diagnosis not present

## 2018-06-10 DIAGNOSIS — Z87891 Personal history of nicotine dependence: Secondary | ICD-10-CM | POA: Insufficient documentation

## 2018-06-10 DIAGNOSIS — E785 Hyperlipidemia, unspecified: Secondary | ICD-10-CM | POA: Diagnosis not present

## 2018-06-10 DIAGNOSIS — J9811 Atelectasis: Secondary | ICD-10-CM | POA: Diagnosis not present

## 2018-06-10 DIAGNOSIS — F4323 Adjustment disorder with mixed anxiety and depressed mood: Secondary | ICD-10-CM | POA: Diagnosis not present

## 2018-06-10 DIAGNOSIS — M79604 Pain in right leg: Secondary | ICD-10-CM

## 2018-06-10 DIAGNOSIS — M79605 Pain in left leg: Secondary | ICD-10-CM | POA: Insufficient documentation

## 2018-06-10 DIAGNOSIS — M79662 Pain in left lower leg: Secondary | ICD-10-CM | POA: Diagnosis not present

## 2018-06-10 DIAGNOSIS — M79661 Pain in right lower leg: Secondary | ICD-10-CM | POA: Diagnosis not present

## 2018-06-10 LAB — CBC
HCT: 36.5 % (ref 36.0–46.0)
HEMOGLOBIN: 11.9 g/dL — AB (ref 12.0–15.0)
MCH: 27.4 pg (ref 26.0–34.0)
MCHC: 32.6 g/dL (ref 30.0–36.0)
MCV: 84.1 fL (ref 78.0–100.0)
PLATELETS: 343 10*3/uL (ref 150–400)
RBC: 4.34 MIL/uL (ref 3.87–5.11)
RDW: 14.1 % (ref 11.5–15.5)
WBC: 10.9 10*3/uL — ABNORMAL HIGH (ref 4.0–10.5)

## 2018-06-10 LAB — BASIC METABOLIC PANEL
Anion gap: 11 (ref 5–15)
BUN: 21 mg/dL (ref 8–23)
CALCIUM: 9.2 mg/dL (ref 8.9–10.3)
CO2: 24 mmol/L (ref 22–32)
CREATININE: 1.03 mg/dL — AB (ref 0.44–1.00)
Chloride: 104 mmol/L (ref 98–111)
GFR, EST NON AFRICAN AMERICAN: 56 mL/min — AB (ref >60)
Glucose, Bld: 146 mg/dL — ABNORMAL HIGH (ref 70–99)
Potassium: 3.8 mmol/L (ref 3.5–5.1)
SODIUM: 139 mmol/L (ref 135–145)

## 2018-06-10 MED ORDER — IOPAMIDOL (ISOVUE-370) INJECTION 76%
100.0000 mL | Freq: Once | INTRAVENOUS | Status: AC | PRN
  Administered 2018-06-10: 100 mL via INTRAVENOUS

## 2018-06-10 NOTE — Telephone Encounter (Signed)
Spoke with patient.  She is headed to the ED at this time.

## 2018-06-10 NOTE — ED Notes (Signed)
Pt back from [place] via transport.

## 2018-06-10 NOTE — ED Notes (Signed)
Patient transported to Ultrasound 

## 2018-06-10 NOTE — ED Triage Notes (Signed)
Pt c/o bil calf pain x 1 week , increased SOb x 1 day

## 2018-06-10 NOTE — ED Notes (Signed)
Pt taken to car by wheelchair

## 2018-06-10 NOTE — Discharge Instructions (Addendum)
See your Physician for recheck in 2-3 days  

## 2018-06-10 NOTE — Telephone Encounter (Signed)
Pt stating for approximately a week she has experienced shortness of breath and increased leg pain in both legs. Pt states that legs ache and throb but states that her legs are not red or swollen. Pt states she is concerned that she has a blood clot because she is experiencing the same symptoms as she did previously. Pt also states that she sent a MyChart Message to Dr. Birdie Riddle previously regarding these symptoms. Pt states she uses 2L of O2 as needed and is currently needing her portable O2 tank. Pt also stating she does not know if current symptoms could be related to a reaction of Lamictal and Levothyroxine. Pt states she is not going to the ED for current symptoms. [place], FC contacted and notified that pt is refusing to go to the ED. Triage note will be routed to the the office for recommendations from pt's PCP. Pt notified that she would be contacted with recommendations of PCP. Pt states that she notes that her pulse ox is 96% and HR-122. Pt advised again that with current symptoms she should seek treatment in the ED. Pt asking if she could go to the ED in [facility] on [address] or if she should wait to hear from Dr. Birdie Riddle. Notified pt that she could go to this ED now  for treatment and with current symptoms.  Pt verbalized understanding and states she will go to the ED in [facility].  Reason for Disposition . Difficulty breathing  Answer Assessment - Initial Assessment Questions 1. ONSET: "When did the pain start?"      Last week 2. LOCATION: "Where is the pain located?"      Both legs 3. PAIN: "How bad is the pain?"    (Scale 1-10; or mild, moderate, severe)   -  MILD (1-3): doesn't interfere with normal activities    -  MODERATE (4-7): interferes with normal activities (e.g., work or school) or awakens from sleep, limping    -  SEVERE (8-10): excruciating pain, unable to do any normal activities, unable to walk     Not assessed 4. WORK OR EXERCISE: "Has there been any recent work or exercise  that involved this part of the body?"      no 5. CAUSE: "What do you think is causing the leg pain?"     Maybe a blood clot, pt has a history of blood clots and was on xarelto previoisuly 6. OTHER SYMPTOMS: "Do you have any other symptoms?" (e.g., chest pain, back pain, breathing difficulty, swelling, rash, fever, numbness, weakness)     Shortness of breah of breath 7. PREGNANCY: "Is there any chance you are pregnant?" "When was your last menstrual period?"     n/a  Protocols used: LEG PAIN-A-AH

## 2018-06-10 NOTE — Telephone Encounter (Signed)
Attempted to reach patient to confirm she is going to ER for evaluation, no answer.

## 2018-06-10 NOTE — ED Notes (Signed)
Patient reports history of PE's.  States she was on blood thinners but they were discontinued by PCP

## 2018-06-10 NOTE — ED Notes (Signed)
Patient transported to CT 

## 2018-06-11 NOTE — ED Provider Notes (Signed)
[facility] EMERGENCY DEPARTMENT Provider Note   CSN: [id number] Arrival date & time: 06/10/18  1402     History   Chief Complaint Chief Complaint  Patient presents with  . Leg Pain    HPI Stacey Alvarez is a 62 y.o. female.  The history is provided by the patient. No language interpreter was used.  Leg Pain   This is a new problem. The current episode started more than 2 days ago. The problem occurs constantly. The problem has been gradually worsening. The pain is present in the right lower leg and left lower leg. The quality of the pain is described as aching. The pain is moderate. Pertinent negatives include no numbness. She has tried nothing for the symptoms. The treatment provided no relief. There has been no history of extremity trauma.  Pt complains of swelling both legs.  Pt reports she is feeling short of breath.  Pt is concerned about having a PE or DVt. Pt has had a PE in the past.    Past Medical History:  Diagnosis Date  . Allergy   . Anxiety   . Arthritis    KNEES,BACK,HIPS  . Depression   . Fibromyalgia   . GERD (gastroesophageal reflux disease)   . Hyperlipidemia   . Hypertension   . IBS (irritable bowel syndrome)   . Interstitial lung disease ([place])   . Sleep apnea     Patient Active Problem List   Diagnosis Date Noted  . ILD (interstitial lung disease) ([place]) 10/01/2016  . Atherosclerosis of native coronary artery without angina pectoris 10/01/2016  . Chronic cough 04/05/2016  . Incidental lung nodule, > 69mm and < 26mm 04/05/2016  . History of sleep apnea 04/05/2016  . Back pain 02/07/2016  . Severe obesity (BMI >= 40) ([place]) 11/24/2015  . Hyperlipidemia 05/24/2015  . Diverticulitis 01/09/2015  . Routine general medical examination at a health care facility 11/21/2014  . HTN (hypertension) 10/28/2013  . OSA (obstructive sleep apnea) 10/28/2013  . Insulin resistance 10/28/2013  . GERD (gastroesophageal reflux disease) 10/28/2013  .  Depression with anxiety 10/28/2013  . Postmenopausal HRT (hormone replacement therapy) 10/28/2013    Past Surgical History:  Procedure Laterality Date  . CHOLECYSTECTOMY    . COLONOSCOPY    . PARTIAL HYSTERECTOMY  1999  . POLYPECTOMY       OB History   None      Home Medications    Prior to Admission medications   Medication Sig Start Date End Date Taking? Authorizing Provider  ALPRAZolam Duanne Moron) 1 MG tablet 0.5 mg in the am and 2mg  at night 11/01/14   [provider]  amLODipine (NORVASC) 5 MG tablet TAKE 1/2 TABLET BY MOUTH IN THE MORNING AND 1 TABLET BY MOUTH AT BEDTIME 03/02/18   Skeet Latch, MD  Biotin 10 MG CAPS Take by mouth.    [provider]  butalbital-aspirin-caffeine-codeine Acquanetta Chain WITH CODEINE) [phone] MG capsule TAKE 1 CAPSULE BY MOUTH EVERY 6 HOURS AS NEEDED FOR PAIN 06/02/18   Midge Minium, MD  cetirizine (ZYRTEC) 10 MG tablet Take 10 mg by mouth daily.    [provider]  DOCOSAHEXAENOIC ACID PO Take 1,280 mg by mouth.    [provider]  fenofibrate 160 MG tablet TAKE 1 TABLET(160 MG) BY MOUTH DAILY 03/16/18   Midge Minium, MD  FLUoxetine (PROZAC) 20 MG capsule TK 3 CS PO QD 05/06/18   [provider]  FLUoxetine (PROZAC) 40 MG capsule Take 80 mg  by mouth daily. 03/05/18   [provider]  fluticasone (FLONASE) 50 MCG/ACT nasal spray Place 1 spray into both nostrils daily.    [provider]  lamoTRIgine (LAMICTAL) 100 MG tablet Take 100 mg by mouth daily.    [provider]  levothyroxine (SYNTHROID, LEVOTHROID) 50 MCG tablet Take 1 tablet (50 mcg total) by mouth daily. 05/22/18   Midge Minium, MD  lisinopril (PRINIVIL,ZESTRIL) 20 MG tablet TAKE 1 TABLET(20 MG) BY MOUTH TWICE DAILY 03/16/18   Midge Minium, MD  meclizine (ANTIVERT) 25 MG tablet Take 1 tablet (25 mg total) by mouth 3 (three) times daily as needed for dizziness. Patient not taking: Reported on  05/20/2018 03/12/17   Midge Minium, MD  mycophenolate (CELLCEPT) 500 MG tablet Take 500 mg by mouth 2 (two) times daily.  07/23/17   [provider]  Omega-3 1000 MG CAPS Take by mouth.    [provider]  OXYGEN Inhale 2 L into the lungs as needed (SOB).    [provider]  pantoprazole (PROTONIX) 40 MG tablet TAKE ONE TABLET BY MOUTH DAILY Patient taking differently: No sig reported 09/26/17   Midge Minium, MD  pravastatin (PRAVACHOL) 40 MG tablet Take 1 tablet (40 mg total) by mouth daily. 05/21/18   Midge Minium, MD  predniSONE (DELTASONE) 10 MG tablet Take 2.5 mg by mouth daily with breakfast.  04/05/17   [provider]  promethazine (PHENERGAN) 25 MG tablet Take 1 tablet (25 mg total) by mouth every 8 (eight) hours as needed for nausea or vomiting. 05/12/18   Midge Minium, MD  promethazine (PHENERGAN) 25 MG tablet TAKE 1 TABLET(25 MG) BY MOUTH EVERY 8 HOURS AS NEEDED FOR NAUSEA OR VOMITING 06/02/18   Midge Minium, MD  temazepam (RESTORIL) 30 MG capsule Take 30 mg by mouth at bedtime.    [provider]  traMADol (ULTRAM) 50 MG tablet Take 1 tablet (50 mg total) by mouth every 6 (six) hours as needed. Patient not taking: Reported on 05/01/2018 02/26/18   Leamon Arnt, MD    Family History Family History  Problem Relation Age of Onset  . Diabetes Mother   . Heart disease Mother   . Anxiety disorder Mother   . Depression Mother   . Kidney disease Mother   . Diabetes Father   . Heart disease Father   . Anxiety disorder Father   . Depression Father   . Diabetes Paternal Grandmother   . Cystic fibrosis Son   . Alcohol abuse Son   . Breast cancer Maternal Aunt     Social History Social History   Tobacco Use  . Smoking status: Former Research scientist (life sciences)  . Smokeless tobacco: Never Used  . Tobacco comment: smoked 2 years in late teens about 8-10 cigs daily.   Substance Use Topics  . Alcohol use: No    Alcohol/week: 0.0  standard drinks    Comment: OCC  . Drug use: No     Allergies   Macrobid [nitrofurantoin monohyd macro] and Sulfa antibiotics   Review of Systems Review of Systems  Neurological: Negative for numbness.  All other systems reviewed and are negative.    Physical Exam Updated Vital Signs BP 138/85 (BP Location: Right Arm)   Pulse 77   Temp (!) 97.3 F (36.3 C) (Oral)   Resp 17   Ht 5\' 4"  (1.626 m)   Wt 109 kg   SpO2 99%   BMI 41.25 kg/m  Physical Exam  Constitutional: She is oriented to person, place, and time. She appears well-developed and well-nourished.  HENT:  Head: Normocephalic.  Eyes: EOM are normal.  Neck: Normal range of motion.  Cardiovascular: Normal rate and regular rhythm.  Pulmonary/Chest: Effort normal. No respiratory distress.  Abdominal: Soft. She exhibits no distension.  Musculoskeletal: Normal range of motion.  Neurological: She is alert and oriented to person, place, and time.  Skin: Skin is warm.  Psychiatric: She has a normal mood and affect.  Nursing note and vitals reviewed.    ED Treatments / Results  Labs (all labs ordered are listed, but only abnormal results are displayed) Labs Reviewed  CBC - Abnormal; Notable for the following components:      Result Value   WBC 10.9 (*)    Hemoglobin 11.9 (*)    All other components within normal limits  BASIC METABOLIC PANEL - Abnormal; Notable for the following components:   Glucose, Bld 146 (*)    Creatinine, Ser 1.03 (*)    GFR calc non Af Amer 56 (*)    All other components within normal limits    EKG EKG Interpretation  Date/Time:  Wednesday June 10 2018 14:15:14 EDT Ventricular Rate:  83 PR Interval:    QRS Duration: 84 QT Interval:  374 QTC Calculation: 440 R Axis:   -15 Text Interpretation:  Sinus rhythm Probable left atrial enlargement Borderline left axis deviation Probable anteroseptal infarct, old No significant change since last tracing Confirmed by Deno Etienne  [phone]) on 06/10/2018 4:22:30 PM   Radiology Dg Chest 2 View  Result Date: 06/10/2018 CLINICAL DATA:  Bilateral calf pain and dyspnea x1 week EXAM: CHEST - 2 VIEW COMPARISON:  10/16/2016 FINDINGS: The heart size and mediastinal contours are within normal limits. Chronic mild elevation of the right hemidiaphragm is stable. Bibasilar subsegmental atelectasis is noted at each lung base. No pulmonary consolidation or edema. No effusion or pneumothorax. No acute osseous abnormality. Tapered appearance of the distal right may represent postop change or can be seen inflammatory arthropathy such as rheumatoid. IMPRESSION: No active cardiopulmonary disease.  Bibasilar atelectasis. Electronically Signed   By: Ashley Royalty M.D.   On: 06/10/2018 14:44   Ct Angio Chest Pe W And/or Wo Contrast  Result Date: 06/10/2018 CLINICAL DATA:  Shortness of breath and leg pain, history of P EXAM: CT ANGIOGRAPHY CHEST WITH CONTRAST TECHNIQUE: Multidetector CT imaging of the chest was performed using the standard protocol during bolus administration of intravenous contrast. Multiplanar CT image reconstructions and MIPs were obtained to evaluate the vascular anatomy. CONTRAST:  167mL ISOVUE-370 IOPAMIDOL (ISOVUE-370) INJECTION 76% COMPARISON:  10/07/2016 chest CT, chest x-ray from earlier in the same day. FINDINGS: Cardiovascular: Thoracic aorta demonstrates a normal branching pattern. Mild atherosclerotic changes are noted. No aneurysmal dilatation is seen. No dissection is noted. No cardiac enlargement is seen. The pulmonary artery shows a normal branching pattern. No definitive filling defects are identified to suggest pulmonary embolism. Mediastinum/Nodes: Thoracic inlet is within normal limits. The esophagus is unremarkable. No hilar or mediastinal adenopathy is identified at this time. Lungs/Pleura: Lungs are well aerated bilaterally. Mild right lower lobe atelectatic changes are seen. No focal infiltrate or sizable effusion is  noted. Some minimal atelectatic changes are noted in the left upper lobe. Upper Abdomen: Visualized upper abdomen is within normal limits. Musculoskeletal: No acute abnormality is noted. Review of the MIP images confirms the above findings. IMPRESSION: No evidence of pulmonary emboli. Patchy atelectatic changes bilaterally without focal confluent  infiltrate. No sizable effusion is seen. Aortic Atherosclerosis (ICD10-I70.0). Electronically Signed   By: Inez Catalina M.D.   On: 06/10/2018 16:10   US Venous Img Lower Bilateral  Result Date: 06/10/2018 CLINICAL DATA:  Bilateral lower extremity pain. Shortness of breath. EXAM: BILATERAL LOWER EXTREMITY VENOUS DOPPLER ULTRASOUND TECHNIQUE: Gray-scale sonography with graded compression, as well as color Doppler and duplex ultrasound were performed to evaluate the lower extremity deep venous systems from the level of the common femoral vein and including the common femoral, femoral, profunda femoral, popliteal and calf veins including the posterior tibial, peroneal and gastrocnemius veins when visible. The superficial great saphenous vein was also interrogated. Spectral Doppler was utilized to evaluate flow at rest and with distal augmentation maneuvers in the common femoral, femoral and popliteal veins. COMPARISON:  None. FINDINGS: RIGHT LOWER EXTREMITY Common Femoral Vein: No evidence of thrombus. Normal compressibility, respiratory phasicity and response to augmentation. Saphenofemoral Junction: No evidence of thrombus. Normal compressibility and flow on color Doppler imaging. Profunda Femoral Vein: No evidence of thrombus. Normal compressibility and flow on color Doppler imaging. Femoral Vein: No evidence of thrombus. Normal compressibility, respiratory phasicity and response to augmentation. Popliteal Vein: No evidence of thrombus. Normal compressibility, respiratory phasicity and response to augmentation. Calf Veins: No evidence of thrombus. Normal compressibility  and flow on color Doppler imaging. Superficial Great Saphenous Vein: No evidence of thrombus. Normal compressibility. Venous Reflux:  None. Other Findings:  None. LEFT LOWER EXTREMITY Common Femoral Vein: No evidence of thrombus. Normal compressibility, respiratory phasicity and response to augmentation. Saphenofemoral Junction: No evidence of thrombus. Normal compressibility and flow on color Doppler imaging. Profunda Femoral Vein: No evidence of thrombus. Normal compressibility and flow on color Doppler imaging. Femoral Vein: No evidence of thrombus. Normal compressibility, respiratory phasicity and response to augmentation. Popliteal Vein: No evidence of thrombus. Normal compressibility, respiratory phasicity and response to augmentation. Calf Veins: No evidence of thrombus. Normal compressibility and flow on color Doppler imaging. Superficial Great Saphenous Vein: No evidence of thrombus. Normal compressibility. Venous Reflux:  None. Other Findings:  None. IMPRESSION: No evidence of deep venous thrombosis. Electronically Signed   By: Rolm Baptise M.D.   On: 06/10/2018 18:07    Procedures Procedures (including critical care time)  Medications Ordered in ED Medications  iopamidol (ISOVUE-370) 76 % injection 100 mL (100 mLs Intravenous Contrast Given 06/10/18 1546)     Initial Impression / Assessment and Plan / ED Course  I have reviewed the triage vital signs and the nursing notes.  Pertinent labs & imaging results that were available during my care of the patient were reviewed by me and considered in my medical decision making (see chart for details).     MDM   Ct angio chest  Shows no PE.  bilat lower extremity ultrasound shows no evidence of DVT.  Pt counseled on edema of lower extremities.  Pt advised to follow up with her MD for recheck.   Final Clinical Impressions(s) / ED Diagnoses   Final diagnoses:  Swelling  Bilateral leg pain    ED Discharge Orders    None    An After Visit  Summary was printed and given to the patient.    Fransico Meadow, PA-C 06/11/18 2311    Deno Etienne, DO 06/14/18 [phone]

## 2018-06-12 NOTE — Unmapped (Signed)
[facility] Refill Coordination Note  Specialty Medication(s): Mycophenolate 500mg   Additional Medications shipped:      Amber Bush, DOB: 01-28-1956  Phone: [phone] (home) [phone] (work), Alternate phone contact: N/A  Phone or address changes today?: No  All above HIPAA information was verified with patient.  Shipping Address: [address]  [place] [address]   Insurance changes? No    Completed refill call assessment today to schedule patient's medication shipment from the [facility] [phone]).      Confirmed the medication and dosage are correct and have not changed: Yes, regimen is correct and unchanged.    Confirmed patient started or stopped the following medications in the past month:  No, there are no changes reported at this time.    Are you tolerating your medication?:  Amber Bush reports tolerating the medication.    ADHERENCE    (Below is required for Medicare Part B or Transplant patients only - per drug):   How many tablets were dispensed last month: 60 tabs  Patient currently has 14 tabs remaining.    Did you miss any doses in the past 4 weeks? No missed doses reported.    FINANCIAL/SHIPPING    Delivery Scheduled: Yes, Expected medication delivery date: 100219     The patient will receive a drug information handout for each medication shipped and additional FDA Medication Guides as required.      Amber Bush did not have any additional questions at this time.    Delivery address validated in Epic.    We will follow up with patient monthly for standard refill processing and delivery.      Thank you,  Amber Bush   [facility] Specialty Technician

## 2018-06-16 ENCOUNTER — Other Ambulatory Visit: Payer: Self-pay | Admitting: Family Medicine

## 2018-06-16 MED FILL — MYCOPHENOLATE MOFETIL 500 MG TABLET: 30 days supply | Qty: 60 | Fill #1 | Status: AC

## 2018-06-16 MED FILL — MYCOPHENOLATE MOFETIL 500 MG TABLET: ORAL | 30 days supply | Qty: 60 | Fill #1

## 2018-06-17 ENCOUNTER — Encounter: Payer: Self-pay | Admitting: Family Medicine

## 2018-06-17 DIAGNOSIS — F4323 Adjustment disorder with mixed anxiety and depressed mood: Secondary | ICD-10-CM | POA: Diagnosis not present

## 2018-06-19 ENCOUNTER — Encounter: Payer: Self-pay | Admitting: Family Medicine

## 2018-06-22 ENCOUNTER — Other Ambulatory Visit: Payer: Self-pay | Admitting: Family Medicine

## 2018-06-25 ENCOUNTER — Other Ambulatory Visit: Payer: Self-pay | Admitting: Family Medicine

## 2018-06-25 DIAGNOSIS — Z1231 Encounter for screening mammogram for malignant neoplasm of breast: Secondary | ICD-10-CM

## 2018-07-01 DIAGNOSIS — F4323 Adjustment disorder with mixed anxiety and depressed mood: Secondary | ICD-10-CM | POA: Diagnosis not present

## 2018-07-02 ENCOUNTER — Other Ambulatory Visit: Payer: Self-pay | Admitting: Family Medicine

## 2018-07-02 MED ORDER — BUTALBITAL-ASA-CAFF-CODEINE 50-325-40-30 MG PO CAPS: 1.0000 | ORAL_CAPSULE | Freq: Three times a day (TID) | ORAL | 0 refills | Status: DC | PRN

## 2018-07-02 NOTE — Telephone Encounter (Signed)
Last OV 05/20/18 Fiorinal last filled 06/02/18 #15 with 0

## 2018-07-04 DIAGNOSIS — G4733 Obstructive sleep apnea (adult) (pediatric): Secondary | ICD-10-CM | POA: Diagnosis not present

## 2018-07-07 NOTE — Unmapped (Signed)
102219-pt states dose has decreased on Mycophenolate 500mg  to :1 tablet twice a day,pt request [place] to call back in 3 weeks,advised her to call back if needs before-CB

## 2018-07-08 ENCOUNTER — Ambulatory Visit: Payer: Self-pay | Admitting: *Deleted

## 2018-07-08 DIAGNOSIS — F4323 Adjustment disorder with mixed anxiety and depressed mood: Secondary | ICD-10-CM | POA: Diagnosis not present

## 2018-07-08 NOTE — Telephone Encounter (Signed)
Will send to Dr Birdie Riddle as Juluis Rainier [facility] sent message to wrong office)

## 2018-07-08 NOTE — Telephone Encounter (Signed)
Patient phoned requesting appointment with Dr. Birdie Riddle. She sounded severely SOB, couldn't speak complete sentences due to her difficulty breathing. Audible wheezing could be heard during our conversation. She reported she has Interstitial lung disease. Reports she does not have inhaler nor nebulizer. She uses 02 at home and is currently driving home. Recommended she pull over and call 911 immediately. She refused, wanting to see Dr. Birdie Riddle. TC to [facility]BS spoke with Levada Dy regarding Dr. Availability this afternoon. Office is closed for the day. Again, recommended patient call 911. Refused again and disconnected our call. TC to her emergency contact # no answer and recording would not allow a message. Will place in call back.  Reason for Disposition . Severe difficulty breathing (e.g., struggling for each breath, speaks in single words)  Answer Assessment - Initial Assessment Questions 1. RESPIRATORY STATUS: "Describe your breathing?" (e.g., wheezing, shortness of breath, unable to speak, severe coughing)      SOB with wheezing 2. ONSET: "When did this breathing problem begin?"      yesterday 3. PATTERN "Does the difficult breathing come and go, or has it been constant since it started?"      Constant worse today 4. SEVERITY: "How bad is your breathing?" (e.g., mild, moderate, severe)    - MILD: No SOB at rest, mild SOB with walking, speaks normally in sentences, can lay down, no retractions, pulse < 100.    - MODERATE: SOB at rest, SOB with minimal exertion and prefers to sit, cannot lie down flat, speaks in phrases, mild retractions, audible wheezing, pulse 100-120.    - SEVERE: Very SOB at rest, speaks in single words, struggling to breathe, sitting hunched forward, retractions, pulse > 120       5. RECURRENT SYMPTOM: "Have you had difficulty breathing before?" If so, ask: "When was the last time?" and "What happened that time?"       6. CARDIAC HISTORY: "Do you have any history of heart disease?"  (e.g., heart attack, angina, bypass surgery, angioplasty)       7. LUNG HISTORY: "Do you have any history of lung disease?"  (e.g., pulmonary embolus, asthma, emphysema)     Interstitial lung disease  8. CAUSE: "What do you think is causing the breathing problem?"      Her lung disease 9. OTHER SYMPTOMS: "Do you have any other symptoms? (e.g., dizziness, runny nose, cough, chest pain, fever)      10. PREGNANCY: "Is there any chance you are pregnant?" "When was your last menstrual period?"       11. TRAVEL: "Have you traveled out of the country in the last month?" (e.g., travel history, exposures)      no  Protocols used: BREATHING DIFFICULTY-A-AH

## 2018-07-08 NOTE — Telephone Encounter (Signed)
ATC pt m# and h# with n/a, LMTCB to assess and offer OV

## 2018-07-08 NOTE — Telephone Encounter (Signed)
TC follow-up to patient. She is at home and applied her O2@2L  and sounds much improved and reports improvement with the wheezing and SOB. She has a pulse oximetry however the battery is dead. She can now talk in full sentences and I no longer hear wheezing over the phone. She explains she was running errands and became concerned over her son who is in critical care at [facility]. Stated she felt the anxiety overcame her earlier but she has settled since. Offered her appointment at other [facility] office today and a 07/09/18 appointment at her PCP office, she refused. Reviewed symptoms for which she should call EMS. Stated she understood.

## 2018-07-23 ENCOUNTER — Ambulatory Visit: Payer: BLUE CROSS/BLUE SHIELD | Admitting: Cardiovascular Disease

## 2018-07-24 ENCOUNTER — Other Ambulatory Visit: Payer: Self-pay

## 2018-07-24 ENCOUNTER — Ambulatory Visit: Payer: BLUE CROSS/BLUE SHIELD | Admitting: Physician Assistant

## 2018-07-24 ENCOUNTER — Encounter: Payer: Self-pay | Admitting: Physician Assistant

## 2018-07-24 ENCOUNTER — Other Ambulatory Visit: Payer: Self-pay | Admitting: Family Medicine

## 2018-07-24 VITALS — BP 118/70 | HR 90 | Temp 97.4°F | Resp 18 | Ht 64.0 in | Wt 240.0 lb

## 2018-07-24 DIAGNOSIS — J111 Influenza due to unidentified influenza virus with other respiratory manifestations: Secondary | ICD-10-CM

## 2018-07-24 LAB — POC INFLUENZA A&B (BINAX/QUICKVUE)
INFLUENZA A, POC: NEGATIVE
INFLUENZA B, POC: POSITIVE — AB

## 2018-07-24 MED ORDER — OSELTAMIVIR PHOSPHATE 75 MG PO CAPS: 75.0000 mg | ORAL_CAPSULE | Freq: Two times a day (BID) | ORAL | 0 refills | Status: DC

## 2018-07-24 MED ORDER — HYDROCOD POLST-CPM POLST ER 10-8 MG/5ML PO SUER: 5.0000 mL | Freq: Two times a day (BID) | ORAL | 0 refills | Status: DC | PRN

## 2018-07-24 NOTE — Patient Instructions (Signed)
Please keep well-hydrated and get plenty of rest. Your symptoms are consistent with a flu-like illness. I am starting you on an antiviral medication called Tamiflu to help shorten the duration and decrease severity of symptoms.  I want you to continue with plain Mucinex to thin congestion. Place a humidifier in the bedroom. Take the Tussionex as directed to calm the cough down.  Giving your history of ILD, and that we are heading into the weekend, I want you to go to the ER if there are any worsening symptoms.

## 2018-07-24 NOTE — Telephone Encounter (Signed)
Last OV 07/24/18 Fiorinal last filled 1017/19 #15 with 0

## 2018-07-24 NOTE — Progress Notes (Signed)
Patient presents to clinic today c/o 2 days of nasal congestion, rhinorrhea, sinus pressure, cough that is mostly dry but productive of some gree sputum this morning, chills and aches. Notes symptoms began two nights ago, waking her from slip. Notes has been significant since. Is taking OTC medications with only some relief of congestion. Cough is affecting sleep and breathing. Denies recent travel or sick contact. Patient with history of ILD.Stacey Alvarez   Past Medical History:  Diagnosis Date  . Allergy   . Anxiety   . Arthritis    KNEES,BACK,HIPS  . Depression   . Fibromyalgia   . GERD (gastroesophageal reflux disease)   . Hyperlipidemia   . Hypertension   . IBS (irritable bowel syndrome)   . Interstitial lung disease ([place])   . Sleep apnea     Current Outpatient Medications on File Prior to Visit  Medication Sig Dispense Refill  . ALPRAZolam (XANAX) 1 MG tablet 0.5 mg in the am and 23m at night    . amLODipine (NORVASC) 5 MG tablet TAKE 1/2 TABLET BY MOUTH IN THE MORNING AND 1 TABLET BY MOUTH AT BEDTIME 135 tablet 2  . Biotin 10 MG CAPS Take by mouth.    . butalbital-aspirin-caffeine-codeine (FIORINAL WITH CODEINE) 50-325-40-30 MG capsule Take 1 capsule by mouth every 8 (eight) hours as needed for pain. 15 capsule 0  . cetirizine (ZYRTEC) 10 MG tablet Take 10 mg by mouth daily.    . DOCOSAHEXAENOIC ACID PO Take 1,280 mg by mouth.    . fenofibrate 160 MG tablet TAKE 1 TABLET(160 MG) BY MOUTH DAILY 90 tablet 0  . FLUoxetine (PROZAC) 20 MG capsule TK 3 CS PO QD  4  . FLUoxetine (PROZAC) 40 MG capsule Take 80 mg by mouth daily.  4  . fluticasone (FLONASE) 50 MCG/ACT nasal spray Place 1 spray into both nostrils daily.    .Stacey KitchenlamoTRIgine (LAMICTAL) 100 MG tablet Take 100 mg by mouth daily.    .Stacey Kitchenlisinopril (PRINIVIL,ZESTRIL) 20 MG tablet TAKE 1 TABLET(20 MG) BY MOUTH TWICE DAILY 180 tablet 0  . meclizine (ANTIVERT) 25 MG tablet Take 1 tablet (25 mg total) by mouth 3 (three) times daily as needed  for dizziness. 45 tablet 0  . mycophenolate (CELLCEPT) 500 MG tablet Take 500 mg by mouth 2 (two) times daily.     . Omega-3 1000 MG CAPS Take by mouth.    . OXYGEN Inhale 2 L into the lungs as needed (SOB).    . pantoprazole (PROTONIX) 40 MG tablet TAKE ONE TABLET BY MOUTH DAILY (Patient taking differently: No sig reported) 30 tablet 0  . pravastatin (PRAVACHOL) 40 MG tablet Take 1 tablet (40 mg total) by mouth daily. 90 tablet 1  . predniSONE (DELTASONE) 10 MG tablet Take 2.5 mg by mouth daily with breakfast.   11  . promethazine (PHENERGAN) 25 MG tablet Take 1 tablet (25 mg total) by mouth every 8 (eight) hours as needed for nausea or vomiting. 15 tablet 0  . promethazine (PHENERGAN) 25 MG tablet TAKE 1 TABLET(25 MG) BY MOUTH EVERY 8 HOURS AS NEEDED FOR NAUSEA OR VOMITING 45 tablet 0  . temazepam (RESTORIL) 30 MG capsule Take 30 mg by mouth at bedtime.    . traMADol (ULTRAM) 50 MG tablet Take 1 tablet (50 mg total) by mouth every 6 (six) hours as needed. 30 tablet 0  . levothyroxine (SYNTHROID, LEVOTHROID) 50 MCG tablet Take 1 tablet (50 mcg total) by mouth daily. (Patient not taking: Reported on  07/24/2018) 30 tablet 3   No current facility-administered medications on file prior to visit.     Allergies  Allergen Reactions  . Macrobid [Nitrofurantoin Monohyd Macro] Diarrhea    Diarrhea, GI upset  . Sulfa Antibiotics Rash    Family History  Problem Relation Age of Onset  . Diabetes Mother   . Heart disease Mother   . Anxiety disorder Mother   . Depression Mother   . Kidney disease Mother   . Diabetes Father   . Heart disease Father   . Anxiety disorder Father   . Depression Father   . Diabetes Paternal Grandmother   . Cystic fibrosis Son   . Alcohol abuse Son   . Breast cancer Maternal Aunt     Social History   Socioeconomic History  . Marital status: Widowed    Spouse name: Not on file  . Number of children: Not on file  . Years of education: Not on file  . Highest  education level: Not on file  Occupational History  . Not on file  Social Needs  . Financial resource strain: Not on file  . Food insecurity:    Worry: Not on file    Inability: Not on file  . Transportation needs:    Medical: Not on file    Non-medical: Not on file  Tobacco Use  . Smoking status: Former Research scientist (life sciences)  . Smokeless tobacco: Never Used  . Tobacco comment: smoked 2 years in late teens about 8-10 cigs daily.   Substance and Sexual Activity  . Alcohol use: No    Alcohol/week: 0.0 standard drinks    Comment: OCC  . Drug use: No  . Sexual activity: Yes  Lifestyle  . Physical activity:    Days per week: Not on file    Minutes per session: Not on file  . Stress: Not on file  Relationships  . Social connections:    Talks on phone: Not on file    Gets together: Not on file    Attends religious service: Not on file    Active member of club or organization: Not on file    Attends meetings of clubs or organizations: Not on file    Relationship status: Not on file  Other Topics Concern  . Not on file  Social History Narrative  . Not on file   Review of Systems - See HPI.  All other ROS are negative.  BP 118/70   Pulse 90   Temp (!) 97.4 F (36.3 C) (Oral)   Resp 18   Ht '5\' 4"'  (1.626 m)   Wt 240 lb (108.9 kg)   SpO2 98%   BMI 41.20 kg/m   Physical Exam  Constitutional: She is oriented to person, place, and time. She appears well-developed and well-nourished.  HENT:  Head: Normocephalic and atraumatic.  Right Ear: External ear normal.  Left Ear: External ear normal.  Nose: Mucosal edema and rhinorrhea present. Right sinus exhibits no maxillary sinus tenderness and no frontal sinus tenderness. Left sinus exhibits no maxillary sinus tenderness and no frontal sinus tenderness.  Mouth/Throat: Oropharynx is clear and moist.  Eyes: Conjunctivae are normal.  Neck: Neck supple.  Cardiovascular: Normal rate, regular rhythm, normal heart sounds and intact distal pulses.    Pulmonary/Chest: Effort normal and breath sounds normal. No stridor. No respiratory distress. She has no wheezes. She has no rales. She exhibits no tenderness.  Lymphadenopathy:    She has no cervical adenopathy.  Neurological: She is alert  and oriented to person, place, and time.  Psychiatric: She has a normal mood and affect.  Vitals reviewed.   Recent Results (from the past 2160 hour(s))  Lipid panel     Status: Abnormal   Collection Time: 05/20/18  2:33 PM  Result Value Ref Range   Cholesterol 263 (H) 0 - 200 mg/dL    Comment: ATP III Classification       Desirable:  < 200 mg/dL               Borderline High:  200 - 239 mg/dL          High:  > = 240 mg/dL   Triglycerides 200.0 (H) 0.0 - 149.0 mg/dL    Comment: Normal:  <150 mg/dLBorderline High:  150 - 199 mg/dL   HDL 54.40 >39.00 mg/dL   VLDL 40.0 0.0 - 40.0 mg/dL   LDL Cholesterol 169 (H) 0 - 99 mg/dL   Total CHOL/HDL Ratio 5     Comment:                Men          Women1/2 Average Risk     3.4          3.3Average Risk          5.0          4.42X Average Risk          9.6          7.13X Average Risk          15.0          11.0                       NonHDL 208.58     Comment: NOTE:  Non-HDL goal should be 30 mg/dL higher than patient's LDL goal (i.e. LDL goal of < 70 mg/dL, would have non-HDL goal of < 100 mg/dL)  Basic metabolic panel     Status: Abnormal   Collection Time: 05/20/18  2:33 PM  Result Value Ref Range   Sodium 140 135 - 145 mEq/L   Potassium 4.1 3.5 - 5.1 mEq/L   Chloride 101 96 - 112 mEq/L   CO2 30 19 - 32 mEq/L   Glucose, Bld 131 (H) 70 - 99 mg/dL   BUN 11 6 - 23 mg/dL   Creatinine, Ser 0.72 0.40 - 1.20 mg/dL   Calcium 9.9 8.4 - 10.5 mg/dL   GFR 86.54 >60.00 mL/min  TSH     Status: None   Collection Time: 05/20/18  2:33 PM  Result Value Ref Range   TSH 0.36 0.35 - 4.50 uIU/mL  Hepatic function panel     Status: None   Collection Time: 05/20/18  2:33 PM  Result Value Ref Range   Total Bilirubin 0.5  0.2 - 1.2 mg/dL   Bilirubin, Direct 0.1 0.0 - 0.3 mg/dL   Alkaline Phosphatase 65 39 - 117 U/L   AST 12 0 - 37 U/L   ALT 11 0 - 35 U/L   Total Protein 7.2 6.0 - 8.3 g/dL   Albumin 4.6 3.5 - 5.2 g/dL  CBC with Differential/Platelet     Status: Abnormal   Collection Time: 05/20/18  2:33 PM  Result Value Ref Range   WBC 8.5 4.0 - 10.5 K/uL   RBC 4.77 3.87 - 5.11 Mil/uL   Hemoglobin 13.2 12.0 - 15.0 g/dL   HCT 38.8 36.0 - 46.0 %  MCV 81.3 78.0 - 100.0 fl   MCHC 34.1 30.0 - 36.0 g/dL   RDW 15.1 11.5 - 15.5 %   Platelets 370.0 150.0 - 400.0 K/uL   Neutrophils Relative % 77.9 (H) 43.0 - 77.0 %   Lymphocytes Relative 17.3 12.0 - 46.0 %   Monocytes Relative 3.5 3.0 - 12.0 %   Eosinophils Relative 0.7 0.0 - 5.0 %   Basophils Relative 0.6 0.0 - 3.0 %   Neutro Abs 6.6 1.4 - 7.7 K/uL   Lymphs Abs 1.5 0.7 - 4.0 K/uL   Monocytes Absolute 0.3 0.1 - 1.0 K/uL   Eosinophils Absolute 0.1 0.0 - 0.7 K/uL   Basophils Absolute 0.1 0.0 - 0.1 K/uL  Hemoglobin A1c     Status: None   Collection Time: 05/20/18  2:33 PM  Result Value Ref Range   Hgb A1c MFr Bld 5.4 4.6 - 6.5 %    Comment: Glycemic Control Guidelines for People with Diabetes:Non Diabetic:  <6%Goal of Therapy: <7%Additional Action Suggested:  >8%   CBC     Status: Abnormal   Collection Time: 06/10/18  2:18 PM  Result Value Ref Range   WBC 10.9 (H) 4.0 - 10.5 K/uL   RBC 4.34 3.87 - 5.11 MIL/uL   Hemoglobin 11.9 (L) 12.0 - 15.0 g/dL   HCT 36.5 36.0 - 46.0 %   MCV 84.1 78.0 - 100.0 fL   MCH 27.4 26.0 - 34.0 pg   MCHC 32.6 30.0 - 36.0 g/dL   RDW 14.1 11.5 - 15.5 %   Platelets 343 150 - 400 K/uL    Comment: Performed at [facility], [address] [place], [place] [address]  Basic metabolic panel     Status: Abnormal   Collection Time: 06/10/18  2:18 PM  Result Value Ref Range   Sodium 139 135 - 145 mmol/L   Potassium 3.8 3.5 - 5.1 mmol/L   Chloride 104 98 - 111 mmol/L   CO2 24 22 - 32 mmol/L   Glucose, Bld 146 (H) 70 -  99 mg/dL   BUN 21 8 - 23 mg/dL   Creatinine, Ser 1.03 (H) 0.44 - 1.00 mg/dL   Calcium 9.2 8.9 - 10.3 mg/dL   GFR calc non Af Amer 56 (L) >60 mL/min   GFR calc Af Amer >60 >60 mL/min    Comment: (NOTE) The eGFR has been calculated using the CKD EPI equation. This calculation has not been validated in all clinical situations. eGFR's persistently <60 mL/min signify possible Chronic Kidney Disease.    Anion gap 11 5 - 15    Comment: Performed at [facility], [address] [place], [place] [address]  POC Influenza A&B(BINAX/QUICKVUE)     Status: Abnormal   Collection Time: 07/24/18 12:07 PM  Result Value Ref Range   Influenza A, POC Negative Negative   Influenza B, POC Positive (A) Negative   Assessment/Plan: 1. Influenza + Flu B. Start Tamilfu BID x 5 days. RX Tussionex. Continue chronic medications as directed along with plain Mucinex. Strict ER precautions reviewed with patient if any worsening symptoms over the weekend giving respiratory history. - POC Influenza A&B(BINAX/QUICKVUE) - oseltamivir (TAMIFLU) 75 MG capsule; Take 1 capsule (75 mg total) by mouth 2 (two) times daily.  Dispense: 10 capsule; Refill: 0 - chlorpheniramine-HYDROcodone (TUSSIONEX PENNKINETIC ER) 10-8 MG/5ML SUER; Take 5 mLs by mouth every 12 (twelve) hours as needed.  Dispense: 140 mL; Refill: 0   Leeanne Rio, PA-C

## 2018-07-27 ENCOUNTER — Encounter: Payer: Self-pay | Admitting: Family Medicine

## 2018-07-28 MED ORDER — PREDNISONE 2.5 MG TABLET
ORAL_TABLET | Freq: Every day | ORAL | 11 refills | 0.00000 days | Status: CP
Start: 2018-07-28 — End: 2018-10-30

## 2018-07-29 ENCOUNTER — Ambulatory Visit: Payer: Self-pay

## 2018-07-31 ENCOUNTER — Other Ambulatory Visit: Payer: Self-pay | Admitting: Physician Assistant

## 2018-07-31 DIAGNOSIS — J111 Influenza due to unidentified influenza virus with other respiratory manifestations: Secondary | ICD-10-CM

## 2018-08-03 NOTE — Unmapped (Signed)
[facility] Refi3ll and Clinical Coordination Note  Medication(s): mycophenolate 500mg     Amber Bush, DOB: 18-May-1956  Phone: [phone] (home) [phone] (work), Alternate phone contact: N/A  Shipping address: [address]  [place] [address]  Phone or address changes today?: No  All above HIPAA information verified.  Insurance changes? No    Completed refill and clinical call assessment today to schedule patient's medication shipment from the [facility] [phone]).      MEDICATION RECONCILIATION    Confirmed the medication and dosage are correct and have not changed: Yes, regimen is correct and unchanged.    Were there any changes to your medication(s) in the past month:  No, there are no changes reported at this time.    ADHERENCE    Is this medicine transplant or covered by Medicare Part B? No.    Did you miss any doses in the past 4 weeks? No missed doses reported.  Adherence counseling provided? Not needed     SIDE EFFECT MANAGEMENT    Are you tolerating your medication?:  Roslind reports tolerating the medication.  Side effect management discussed: None      Therapy is appropriate and should be continued.    Evidence of clinical benefit: See Epic note from 03/09/18      FINANCIAL/SHIPPING    Delivery Scheduled: Yes, Expected medication delivery date: 08/11/18     Medication will be delivered via UPS to the home address in [facility].    Additional medications refilled: No additional medications/refills needed at this time.    The patient will receive a drug information handout for each medication shipped and additional FDA Medication Guides as required.      Tam did not have any additional questions at this time.    Delivery address confirmed in Epic.     We will follow up with patient monthly for standard refill processing and delivery.      Thank you,  [name]   [facility] Specialty Pharmacist

## 2018-08-04 DIAGNOSIS — G4733 Obstructive sleep apnea (adult) (pediatric): Secondary | ICD-10-CM | POA: Diagnosis not present

## 2018-08-05 ENCOUNTER — Encounter: Payer: Self-pay | Admitting: Family Medicine

## 2018-08-06 MED ORDER — ALBUTEROL SULFATE HFA 108 (90 BASE) MCG/ACT IN AERS: 2.0000 | INHALATION_SPRAY | Freq: Four times a day (QID) | RESPIRATORY_TRACT | 2 refills | Status: DC | PRN

## 2018-08-10 ENCOUNTER — Telehealth (INDEPENDENT_AMBULATORY_CARE_PROVIDER_SITE_OTHER): Payer: BLUE CROSS/BLUE SHIELD | Admitting: *Deleted

## 2018-08-10 ENCOUNTER — Encounter (HOSPITAL_COMMUNITY): Payer: Self-pay | Admitting: Emergency Medicine

## 2018-08-10 ENCOUNTER — Other Ambulatory Visit: Payer: Self-pay

## 2018-08-10 ENCOUNTER — Ambulatory Visit: Payer: BLUE CROSS/BLUE SHIELD | Admitting: Cardiovascular Disease

## 2018-08-10 ENCOUNTER — Emergency Department (HOSPITAL_COMMUNITY)
Admission: EM | Admit: 2018-08-10 | Discharge: 2018-08-10 | Disposition: A | Discharge status: Home / Self Care | Payer: BLUE CROSS/BLUE SHIELD | Attending: Emergency Medicine | Admitting: Emergency Medicine

## 2018-08-10 ENCOUNTER — Emergency Department (HOSPITAL_COMMUNITY): Payer: BLUE CROSS/BLUE SHIELD

## 2018-08-10 DIAGNOSIS — Z7902 Long term (current) use of antithrombotics/antiplatelets: Secondary | ICD-10-CM | POA: Diagnosis not present

## 2018-08-10 DIAGNOSIS — J849 Interstitial pulmonary disease, unspecified: Secondary | ICD-10-CM | POA: Insufficient documentation

## 2018-08-10 DIAGNOSIS — Z87891 Personal history of nicotine dependence: Secondary | ICD-10-CM | POA: Diagnosis not present

## 2018-08-10 DIAGNOSIS — R0789 Other chest pain: Secondary | ICD-10-CM | POA: Insufficient documentation

## 2018-08-10 DIAGNOSIS — R0602 Shortness of breath: Secondary | ICD-10-CM

## 2018-08-10 DIAGNOSIS — I10 Essential (primary) hypertension: Secondary | ICD-10-CM | POA: Diagnosis not present

## 2018-08-10 DIAGNOSIS — J80 Acute respiratory distress syndrome: Secondary | ICD-10-CM | POA: Diagnosis not present

## 2018-08-10 DIAGNOSIS — R0902 Hypoxemia: Secondary | ICD-10-CM | POA: Diagnosis not present

## 2018-08-10 DIAGNOSIS — Z79899 Other long term (current) drug therapy: Secondary | ICD-10-CM | POA: Insufficient documentation

## 2018-08-10 DIAGNOSIS — R0689 Other abnormalities of breathing: Secondary | ICD-10-CM | POA: Diagnosis not present

## 2018-08-10 LAB — BASIC METABOLIC PANEL
ANION GAP: 13 (ref 5–15)
BUN: 15 mg/dL (ref 8–23)
CO2: 21 mmol/L — ABNORMAL LOW (ref 22–32)
CREATININE: 0.84 mg/dL (ref 0.44–1.00)
Calcium: 9.4 mg/dL (ref 8.9–10.3)
Chloride: 104 mmol/L (ref 98–111)
GFR calc non Af Amer: >60 mL/min (ref >60)
Glucose, Bld: 129 mg/dL — ABNORMAL HIGH (ref 70–99)
POTASSIUM: 3.5 mmol/L (ref 3.5–5.1)
SODIUM: 138 mmol/L (ref 135–145)

## 2018-08-10 LAB — CBC WITH DIFFERENTIAL/PLATELET
Abs Immature Granulocytes: 0.05 10*3/uL (ref 0.00–0.07)
BASOS ABS: 0.1 10*3/uL (ref 0.0–0.1)
Basophils Relative: 0 %
EOS PCT: 1 %
Eosinophils Absolute: 0.1 10*3/uL (ref 0.0–0.5)
HCT: 39.5 % (ref 36.0–46.0)
HEMOGLOBIN: 12.4 g/dL (ref 12.0–15.0)
IMMATURE GRANULOCYTES: 0 %
Lymphocytes Relative: 15 %
Lymphs Abs: 1.8 10*3/uL (ref 0.7–4.0)
MCH: 26.5 pg (ref 26.0–34.0)
MCHC: 31.4 g/dL (ref 30.0–36.0)
MCV: 84.4 fL (ref 80.0–100.0)
Monocytes Absolute: 0.5 10*3/uL (ref 0.1–1.0)
Monocytes Relative: 4 %
NEUTROS PCT: 80 %
NRBC: 0 % (ref 0.0–0.2)
Neutro Abs: 9.5 10*3/uL — ABNORMAL HIGH (ref 1.7–7.7)
Platelets: 333 10*3/uL (ref 150–400)
RBC: 4.68 MIL/uL (ref 3.87–5.11)
RDW: 13.6 % (ref 11.5–15.5)
WBC: 12 10*3/uL — AB (ref 4.0–10.5)

## 2018-08-10 LAB — I-STAT TROPONIN, ED: TROPONIN I, POC: 0 ng/mL (ref 0.00–0.08)

## 2018-08-10 LAB — BRAIN NATRIURETIC PEPTIDE: B Natriuretic Peptide: 19.5 pg/mL (ref 0.0–100.0)

## 2018-08-10 MED ORDER — IPRATROPIUM-ALBUTEROL 0.5-2.5 (3) MG/3ML IN SOLN
3.0000 mL | Freq: Once | RESPIRATORY_TRACT | Status: AC
  Administered 2018-08-10: 3 mL via RESPIRATORY_TRACT
  Filled 2018-08-10: qty 3

## 2018-08-10 MED FILL — MYCOPHENOLATE MOFETIL 500 MG TABLET: 30 days supply | Qty: 60 | Fill #2 | Status: AC

## 2018-08-10 MED FILL — MYCOPHENOLATE MOFETIL 500 MG TABLET: ORAL | 30 days supply | Qty: 60 | Fill #2

## 2018-08-10 NOTE — ED Triage Notes (Signed)
Pt here from MD office with c/o sob , sats 97 % on room air ems gave one albuterol treatment with improvement

## 2018-08-10 NOTE — Telephone Encounter (Signed)
Patient presented to office for follow up visit. When she arrived in lobby she was having difficulty breathing, history of ILD She did have flu last week. 911 called for patient, copy of EKG given to EMS. Dr Oval Linsey discussed with patient and advised ED

## 2018-08-10 NOTE — ED Notes (Signed)
Patient verbalizes understanding of discharge instructions. Opportunity for questioning and answers were provided. Armband removed by staff, pt discharged from ED ambulatory.   

## 2018-08-10 NOTE — Progress Notes (Deleted)
Cardiology Office Note   Date:  08/10/2018   ID:  Stacey Alvarez, DOB 1956/07/06, MRN [id number]  PCP:  Stacey Minium, MD  Cardiologist:   Stacey Latch, MD   No chief complaint on file.    History of Present Illness: Stacey Alvarez is a 62 y.o. female with hypertension, hyperlipidemia, hypertriglyceridemia  ILD, prior DVT/PE, morbid obesity, and OSA here for follow-up.  She was initially seen 07/2017 for the evaluation of palpitations.  She had one episode of palpitations that occurred during pulmonary rehab where her vital signs were found to be within normal limits.  She feels like her heart is pounding very hard when exercising and even after she quits.  She had a 1-lead EKG while having symptoms  that reportedly showed normal sinus rhythm.  She notes that her oxygen saturations and heart rate are always within normal limits but she feels poorly.  She was referred for [facility] 07/2017 that revealed LVEF 67% and no ischemia.  Stacey Alvarez had a pulmonary embolism 09/2016.  Echo at that time revealed LVEF 65-70% with grade 1 diastolic dysfunction and mild aortic regurgitation.  Stacey Alvarez graduated from the pulmonary rehab program.  She has not been getting much formal exercise since finishing that.  She has stable shortness of breath.  She does try to do some walking around her neighborhood but is not consistent with this.  She has no chest pain or pressure.  She denies lower extremity edema, orthopnea, or PND.  She is frustrated because she continues to gain weight on prednisone.  She has been told that she will have to be on prednisone and CellCept for the rest of her life.  She also continues to struggle with the loss of her husband of 42 years.  She has a lot of family support and is participating in counseling and grief share program.  She is also stressed by her son who has cystic fibrosis and a drug addict.  She notes that her blood pressure has been running high when she  checks it at her doctor's offices.   Past Medical History:  Diagnosis Date  . Allergy   . Anxiety   . Arthritis    KNEES,BACK,HIPS  . Depression   . Fibromyalgia   . GERD (gastroesophageal reflux disease)   . Hyperlipidemia   . Hypertension   . IBS (irritable bowel syndrome)   . Interstitial lung disease ([place])   . Sleep apnea     Past Surgical History:  Procedure Laterality Date  . CHOLECYSTECTOMY    . COLONOSCOPY    . PARTIAL HYSTERECTOMY  1999  . POLYPECTOMY       Current Outpatient Medications  Medication Sig Dispense Refill  . albuterol (PROVENTIL HFA;VENTOLIN HFA) 108 (90 Base) MCG/ACT inhaler Inhale 2 puffs into the lungs every 6 (six) hours as needed for wheezing or shortness of breath. 1 Inhaler 2  . ALPRAZolam (XANAX) 1 MG tablet 0.5 mg in the am and 2mg  at night    . amLODipine (NORVASC) 5 MG tablet TAKE 1/2 TABLET BY MOUTH IN THE MORNING AND 1 TABLET BY MOUTH AT BEDTIME 135 tablet 2  . Biotin 10 MG CAPS Take by mouth.    . butalbital-aspirin-caffeine-codeine (FIORINAL WITH CODEINE) 50-325-40-30 MG capsule TAKE 1 CAPSULE BY MOUTH EVERY 8 HOURS AS NEEDED FOR PAIN 15 capsule 0  . cetirizine (ZYRTEC) 10 MG tablet Take 10 mg by mouth daily.    . chlorpheniramine-HYDROcodone (TUSSIONEX PENNKINETIC ER) 10-8 MG/5ML  SUER Take 5 mLs by mouth every 12 (twelve) hours as needed. 140 mL 0  . DOCOSAHEXAENOIC ACID PO Take 1,280 mg by mouth.    . fenofibrate 160 MG tablet TAKE 1 TABLET(160 MG) BY MOUTH DAILY 90 tablet 0  . FLUoxetine (PROZAC) 20 MG capsule TK 3 CS PO QD  4  . FLUoxetine (PROZAC) 40 MG capsule Take 80 mg by mouth daily.  4  . fluticasone (FLONASE) 50 MCG/ACT nasal spray Place 1 spray into both nostrils daily.    Marland Kitchen lamoTRIgine (LAMICTAL) 100 MG tablet Take 100 mg by mouth daily.    Marland Kitchen levothyroxine (SYNTHROID, LEVOTHROID) 50 MCG tablet Take 1 tablet (50 mcg total) by mouth daily. (Patient not taking: Reported on 07/24/2018) 30 tablet 3  . lisinopril  (PRINIVIL,ZESTRIL) 20 MG tablet TAKE 1 TABLET(20 MG) BY MOUTH TWICE DAILY 180 tablet 0  . meclizine (ANTIVERT) 25 MG tablet Take 1 tablet (25 mg total) by mouth 3 (three) times daily as needed for dizziness. 45 tablet 0  . mycophenolate (CELLCEPT) 500 MG tablet Take 500 mg by mouth 2 (two) times daily.     . Omega-3 1000 MG CAPS Take by mouth.    . oseltamivir (TAMIFLU) 75 MG capsule Take 1 capsule (75 mg total) by mouth 2 (two) times daily. 10 capsule 0  . OXYGEN Inhale 2 L into the lungs as needed (SOB).    . pantoprazole (PROTONIX) 40 MG tablet TAKE ONE TABLET BY MOUTH DAILY (Patient taking differently: No sig reported) 30 tablet 0  . pravastatin (PRAVACHOL) 40 MG tablet Take 1 tablet (40 mg total) by mouth daily. 90 tablet 1  . predniSONE (DELTASONE) 10 MG tablet Take 2.5 mg by mouth daily with breakfast.   11  . promethazine (PHENERGAN) 25 MG tablet Take 1 tablet (25 mg total) by mouth every 8 (eight) hours as needed for nausea or vomiting. 15 tablet 0  . promethazine (PHENERGAN) 25 MG tablet TAKE 1 TABLET(25 MG) BY MOUTH EVERY 8 HOURS AS NEEDED FOR NAUSEA OR VOMITING 45 tablet 0  . temazepam (RESTORIL) 30 MG capsule Take 30 mg by mouth at bedtime.    . traMADol (ULTRAM) 50 MG tablet Take 1 tablet (50 mg total) by mouth every 6 (six) hours as needed. 30 tablet 0   No current facility-administered medications for this visit.     Allergies:   Macrobid [nitrofurantoin monohyd macro] and Sulfa antibiotics    Social History:  The patient  reports that she has quit smoking. She has never used smokeless tobacco. She reports that she does not drink alcohol or use drugs.   Family History:  The patient's family history includes Alcohol abuse in her son; Anxiety disorder in her father and mother; Breast cancer in her maternal aunt; Cystic fibrosis in her son; Depression in her father and mother; Diabetes in her father, mother, and paternal grandmother; Heart disease in her father and mother; Kidney  disease in her mother.    ROS:  Please see the history of present illness.   Otherwise, review of systems are positive for none.   All other systems are reviewed and negative.    PHYSICAL EXAM: VS:  There were no vitals taken for this visit. , BMI There is no height or weight on file to calculate BMI. GENERAL:  Well appearing.   HEENT: Pupils equal round and reactive, fundi not visualized, oral mucosa unremarkable NECK:  No jugular venous distention, waveform within normal limits, carotid upstroke brisk and symmetric,  no bruits LUNGS:  Clear to auscultation bilaterally HEART:  RRR.  PMI not displaced or sustained,S1 and S2 within normal limits, no S3, no S4, no clicks, no rubs, no murmurs ABD:  Flat, positive bowel sounds normal in frequency in pitch, no bruits, no rebound, no guarding, no midline pulsatile mass, no hepatomegaly, no splenomegaly EXT:  2 plus pulses throughout, no edema, no cyanosis no clubbing SKIN:  No rashes no nodules NEURO:  Cranial nerves II through XII grossly intact, motor grossly intact throughout PSYCH:  Cognitively intact, oriented to person place and time  EKG:  EKG is not ordered today. The ekg ordered today demonstrates sinus rhythm.  Rate 91 bpm.  LAFB.  LVH.  Poor R wave progression.  Echo 10/07/16: Study Conclusions  - Left ventricle: The cavity size was normal. There was mild   concentric hypertrophy. Systolic function was vigorous. The   estimated ejection fraction was in the range of 65% to 70%. Wall   motion was normal; there were no regional wall motion   abnormalities. Doppler parameters are consistent with abnormal   left ventricular relaxation (grade 1 diastolic dysfunction).   Doppler parameters are consistent with elevated ventricular   end-diastolic filling pressure. - Aortic valve: There was mild regurgitation. - Left atrium: The atrium was normal in size. - Right ventricle: The cavity size was normal. Wall thickness was   normal.  Systolic function was normal. - Tricuspid valve: There was mild regurgitation. - Pulmonary arteries: Systolic pressure was within the normal   range. - Inferior vena cava: The vessel was normal in size. - Pericardium, extracardiac: There was no pericardial effusion.   Lexiscan Myoview 08/12/17:  The left ventricular ejection fraction is hyperdynamic (>65%).  Nuclear stress EF: 67%.  There was no ST segment deviation noted during stress.  The study is normal.  This is a low risk study.   Normal resting and stress perfusion. No ischemia or infarction EF 67%   Recent Labs: 05/20/2018: ALT 11; TSH 0.36 06/10/2018: BUN 21; Creatinine, Ser 1.03; Hemoglobin 11.9; Platelets 343; Potassium 3.8; Sodium 139    Lipid Panel    Component Value Date/Time   CHOL 263 (H) 05/20/2018 1433   TRIG 200.0 (H) 05/20/2018 1433   HDL 54.40 05/20/2018 1433   CHOLHDL 5 05/20/2018 1433   VLDL 40.0 05/20/2018 1433   LDLCALC 169 (H) 05/20/2018 1433   LDLDIRECT 144.0 06/12/2016 1112      Wt Readings from Last 3 Encounters:  07/24/18 240 lb (108.9 kg)  06/10/18 240 lb 4.8 oz (109 kg)  05/20/18 241 lb (109.3 kg)      ASSESSMENT AND PLAN:  # Exertional palpitations: Resolved after reducing caffeine.   # Hypertension:  Blood pressure remains elevated.  Start amloidipine 2.5mg  daily and continue 5mg  qhs.  Continue lisinopril.   # Hyperlipidemia: Continue rosuvastatin.  LDL 123 06/2017.   # Prior PE/DVT: On Xarelto.   # Morbid obesity:  Ms. Bice continues to struggle with weight gain.  This is partially attributable to her prednisone use.  However, she is also not very physically active.  We discussed the importance of increasing her exercise 250 minutes/week.  Her caloric intake is already low.   Current medicines are reviewed at length with the patient today.  The patient does not have concerns regarding medicines.  The following changes have been made:  no change  Labs/ tests ordered  today include:   No orders of the defined types were placed in this encounter.  Disposition:   FU with Leen Tworek C. Oval Linsey, MD, [facility] in 3 months.    This note was written with the assistance of speech recognition software.  Please excuse any transcriptional errors.  Signed, Yoland Scherr C. Oval Linsey, MD, [facility]  08/10/2018 10:51 AM    [facility]

## 2018-08-10 NOTE — Discharge Instructions (Addendum)
Use your inhaler as prescribed by your primary care provider. Follow-up with your primary care provider as well as your cardiologist to further discuss your chest discomfort. Return to the ER for any changes in her typical pattern of chest discomfort or for any other worsening or concerning symptoms.

## 2018-08-10 NOTE — ED Provider Notes (Signed)
[facility] EMERGENCY DEPARTMENT Provider Note   CSN: [id number] Arrival date & time: 08/10/18  1155     History   Chief Complaint Chief Complaint  Patient presents with  . Shortness of Breath    HPI Stacey Alvarez is a 62 y.o. female.  62yo female brought in by EMS for shortness of breath.  Patient states she was diagnosed with flu B on November 8, treated with Tamiflu and Tussionex and has not felt well since that time. Patient is visibly not feeling well and does not participate very much in her history at this time.  Patient called her PCP office 5 days ago and ask for an inhaler to help with wheezing, this was called into her pharmacy however she has not picked up.  Patient went to her cardiologist's office today for follow up after increase in her BP meds however arrived to the office short of breath and 911 was called for transport to the ER.  Patient was given an albuterol neb treatment in route to the ER and placed on oxygen with some improvement in her symptoms.  Patient states that she has had an aching pain in her left shoulder and under her left breast off and on for the past few days, no pain today or at this time.  Patient has a history of interstitial lung disease, takes CellCept and prednisone, prior history of PE, no longer on anticoagulant. No other complaints or concerns.      Past Medical History:  Diagnosis Date  . Allergy   . Anxiety   . Arthritis    KNEES,BACK,HIPS  . Depression   . Fibromyalgia   . GERD (gastroesophageal reflux disease)   . Hyperlipidemia   . Hypertension   . IBS (irritable bowel syndrome)   . Interstitial lung disease ([place])   . Sleep apnea     Patient Active Problem List   Diagnosis Date Noted  . ILD (interstitial lung disease) ([place]) 10/01/2016  . Atherosclerosis of native coronary artery without angina pectoris 10/01/2016  . Chronic cough 04/05/2016  . Incidental lung nodule, > 17mm and < 54mm 04/05/2016  .  History of sleep apnea 04/05/2016  . Back pain 02/07/2016  . Severe obesity (BMI >= 40) ([place]) 11/24/2015  . Hyperlipidemia 05/24/2015  . Diverticulitis 01/09/2015  . Routine general medical examination at a health care facility 11/21/2014  . HTN (hypertension) 10/28/2013  . OSA (obstructive sleep apnea) 10/28/2013  . Insulin resistance 10/28/2013  . GERD (gastroesophageal reflux disease) 10/28/2013  . Depression with anxiety 10/28/2013  . Postmenopausal HRT (hormone replacement therapy) 10/28/2013    Past Surgical History:  Procedure Laterality Date  . CHOLECYSTECTOMY    . COLONOSCOPY    . PARTIAL HYSTERECTOMY  1999  . POLYPECTOMY       OB History   None      Home Medications    Prior to Admission medications   Medication Sig Start Date End Date Taking? Authorizing Provider  albuterol (PROVENTIL HFA;VENTOLIN HFA) 108 (90 Base) MCG/ACT inhaler Inhale 2 puffs into the lungs every 6 (six) hours as needed for wheezing or shortness of breath. 08/06/18   Midge Minium, MD  ALPRAZolam Duanne Moron) 1 MG tablet 0.5 mg in the am and 2mg  at night 11/01/14   [provider]  amLODipine (NORVASC) 5 MG tablet TAKE 1/2 TABLET BY MOUTH IN THE MORNING AND 1 TABLET BY MOUTH AT BEDTIME 03/02/18   Skeet Latch, MD  Biotin 10 MG CAPS Take  by mouth.    [provider]  butalbital-aspirin-caffeine-codeine Acquanetta Chain WITH CODEINE) [phone] MG capsule TAKE 1 CAPSULE BY MOUTH EVERY 8 HOURS AS NEEDED FOR PAIN 07/24/18   Midge Minium, MD  cetirizine (ZYRTEC) 10 MG tablet Take 10 mg by mouth daily.    [provider]  chlorpheniramine-HYDROcodone (TUSSIONEX PENNKINETIC ER) 10-8 MG/5ML SUER Take 5 mLs by mouth every 12 (twelve) hours as needed. 07/24/18   Brunetta Jeans, PA-C  DOCOSAHEXAENOIC ACID PO Take 1,280 mg by mouth.    [provider]  fenofibrate 160 MG tablet TAKE 1 TABLET(160 MG) BY MOUTH DAILY 06/23/18   Midge Minium, MD  FLUoxetine  (PROZAC) 20 MG capsule TK 3 CS PO QD 05/06/18   [provider]  FLUoxetine (PROZAC) 40 MG capsule Take 80 mg by mouth daily. 03/05/18   [provider]  fluticasone (FLONASE) 50 MCG/ACT nasal spray Place 1 spray into both nostrils daily.    [provider]  lamoTRIgine (LAMICTAL) 100 MG tablet Take 100 mg by mouth daily.    [provider]  levothyroxine (SYNTHROID, LEVOTHROID) 50 MCG tablet Take 1 tablet (50 mcg total) by mouth daily. Patient not taking: Reported on 07/24/2018 05/22/18   Midge Minium, MD  lisinopril (PRINIVIL,ZESTRIL) 20 MG tablet TAKE 1 TABLET(20 MG) BY MOUTH TWICE DAILY 06/17/18   Midge Minium, MD  meclizine (ANTIVERT) 25 MG tablet Take 1 tablet (25 mg total) by mouth 3 (three) times daily as needed for dizziness. 03/12/17   Midge Minium, MD  mycophenolate (CELLCEPT) 500 MG tablet Take 500 mg by mouth 2 (two) times daily.  07/23/17   [provider]  Omega-3 1000 MG CAPS Take by mouth.    [provider]  oseltamivir (TAMIFLU) 75 MG capsule Take 1 capsule (75 mg total) by mouth 2 (two) times daily. 07/24/18   Brunetta Jeans, PA-C  OXYGEN Inhale 2 L into the lungs as needed (SOB).    [provider]  pantoprazole (PROTONIX) 40 MG tablet TAKE ONE TABLET BY MOUTH DAILY Patient taking differently: No sig reported 09/26/17   Midge Minium, MD  pravastatin (PRAVACHOL) 40 MG tablet Take 1 tablet (40 mg total) by mouth daily. 05/21/18   Midge Minium, MD  predniSONE (DELTASONE) 10 MG tablet Take 2.5 mg by mouth daily with breakfast.  04/05/17   [provider]  promethazine (PHENERGAN) 25 MG tablet Take 1 tablet (25 mg total) by mouth every 8 (eight) hours as needed for nausea or vomiting. 05/12/18   Midge Minium, MD  promethazine (PHENERGAN) 25 MG tablet TAKE 1 TABLET(25 MG) BY MOUTH EVERY 8 HOURS AS NEEDED FOR NAUSEA OR VOMITING 06/02/18   Midge Minium, MD  temazepam (RESTORIL)  30 MG capsule Take 30 mg by mouth at bedtime.    [provider]  traMADol (ULTRAM) 50 MG tablet Take 1 tablet (50 mg total) by mouth every 6 (six) hours as needed. 02/26/18   Leamon Arnt, MD    Family History Family History  Problem Relation Age of Onset  . Diabetes Mother   . Heart disease Mother   . Anxiety disorder Mother   . Depression Mother   . Kidney disease Mother   . Diabetes Father   . Heart disease Father   . Anxiety disorder Father   . Depression Father   . Diabetes Paternal Grandmother   . Cystic fibrosis Son   . Alcohol abuse Son   .  Breast cancer Maternal Aunt     Social History Social History   Tobacco Use  . Smoking status: Former Research scientist (life sciences)  . Smokeless tobacco: Never Used  . Tobacco comment: smoked 2 years in late teens about 8-10 cigs daily.   Substance Use Topics  . Alcohol use: No    Alcohol/week: 0.0 standard drinks    Comment: OCC  . Drug use: No     Allergies   Macrobid [nitrofurantoin monohyd macro] and Sulfa antibiotics   Review of Systems Review of Systems  Constitutional: Negative for chills, diaphoresis and fever.  Respiratory: Positive for chest tightness, shortness of breath and wheezing.   Cardiovascular: Positive for chest pain.  Gastrointestinal: Negative for nausea and vomiting.  Skin: Negative for rash and wound.  Allergic/Immunologic: Positive for immunocompromised state.  All other systems reviewed and are negative.    Physical Exam Updated Vital Signs BP (!) 148/70   Pulse 96   Temp 98.2 F (36.8 C)   Resp (!) 24   Ht 5\' 4"  (1.626 m)   Wt 108.8 kg   SpO2 97%   BMI 41.17 kg/m   Physical Exam  Constitutional: She is oriented to person, place, and time. She appears well-developed and well-nourished. She appears distressed.  HENT:  Head: Normocephalic and atraumatic.  Mouth/Throat: Oropharynx is clear and moist. No posterior oropharyngeal edema.  Eyes: Pupils are equal, round, and reactive to light.    Neck: Neck supple.  Cardiovascular: Normal rate, regular rhythm, normal heart sounds and intact distal pulses.  No murmur heard. Pulmonary/Chest: Effort normal. Tachypnea noted. She has decreased breath sounds. She has wheezes. She exhibits no tenderness.  Abdominal: Soft. There is no tenderness.  Musculoskeletal:       Right lower leg: She exhibits no tenderness and no edema.       Left lower leg: She exhibits no tenderness and no edema.  Lymphadenopathy:    She has no cervical adenopathy.  Neurological: She is alert and oriented to person, place, and time.  Skin: Skin is warm and dry. She is not diaphoretic.  Psychiatric: She has a normal mood and affect. Her behavior is normal.  Nursing note and vitals reviewed.    ED Treatments / Results  Labs (all labs ordered are listed, but only abnormal results are displayed) Labs Reviewed  BASIC METABOLIC PANEL - Abnormal; Notable for the following components:      Result Value   CO2 21 (*)    Glucose, Bld 129 (*)    All other components within normal limits  CBC WITH DIFFERENTIAL/PLATELET - Abnormal; Notable for the following components:   WBC 12.0 (*)    Neutro Abs 9.5 (*)    All other components within normal limits  BRAIN NATRIURETIC PEPTIDE  I-STAT TROPONIN, ED    EKG EKG Interpretation  Date/Time:  Monday August 10 2018 12:04:05 EST Ventricular Rate:  96 PR Interval:    QRS Duration: 79 QT Interval:  414 QTC Calculation: 524 R Axis:   -23 Text Interpretation:  Sinus rhythm Borderline left axis deviation Borderline T abnormalities, diffuse leads Prolonged QT interval Confirmed by Julianne Rice [phone]) on 08/10/2018 2:11:50 PM   Radiology Dg Chest Port 1 View  Result Date: 08/10/2018 CLINICAL DATA:  Shortness of Breath EXAM: PORTABLE CHEST 1 VIEW COMPARISON:  06/10/2018 FINDINGS: Cardiac shadow is mildly prominent but accentuated by the portable technique. The overall inspiratory effort is stable. Elevation of  the right hemidiaphragm is seen. No focal infiltrate is noted. IMPRESSION:  No acute abnormality noted. Electronically Signed   By: Inez Catalina M.D.   On: 08/10/2018 12:32    Procedures Procedures (including critical care time)  Medications Ordered in ED Medications  ipratropium-albuterol (DUONEB) 0.5-2.5 (3) MG/3ML nebulizer solution 3 mL (3 mLs Nebulization Given 08/10/18 1233)     Initial Impression / Assessment and Plan / ED Course  I have reviewed the triage vital signs and the nursing notes.  Pertinent labs & imaging results that were available during my care of the patient were reviewed by me and considered in my medical decision making (see chart for details).  Clinical Course as of Aug 10 1506  Mon Aug 10, 2018  1449 62yo female with history of interstitial lung disease brought in by EMS for difficulty breathing. Patient given albuterol neb by EMS with slight improvement, given duoneb in the ER with resolution of symptoms. Patient has home O2 PRN, was not using her O2 at the time of the episode today. Patient reports intermittent aching pain in her left shoulder and left chest under her left breast area, intermittent x 3 weeks, occurs spontaneously as well as with exertion, resolves with rest, no pain at this time. Patient was treated for the flu 07/24/18, completed tamiflu, reports she was feeling better however had ongoing cough/wheezing and requested inhaler from her pcp but did not pick up the rx. Discussed results including negative bnp, negative trop, WBC with 12.0 WBC on prednisone currently, BMP without significant changes. Recommend repeat troponin, patient declines but agrees to discuss her discomfort with her cardiologist in follow up and will return to the ER for changes in her typical pain pattern or any other new/concerning symptoms. CXR is negative for PNA. Patient weaned from O2 and maintains even and unlabored respirations with normal O2 sats. Dc home to follow up, return  as needed.   [LM]    Clinical Course User Index [LM] Tacy Learn, PA-C   Final Clinical Impressions(s) / ED Diagnoses   Final diagnoses:  Interstitial lung disease ([place])  SOB (shortness of breath)  Chest pain, atypical    ED Discharge Orders    None       Roque Lias 08/10/18 1507    Julianne Rice, MD 08/11/18 2253

## 2018-08-12 ENCOUNTER — Encounter: Payer: Self-pay | Admitting: Family Medicine

## 2018-08-12 MED ORDER — PANTOPRAZOLE SODIUM 40 MG PO TBEC: 40.0000 mg | DELAYED_RELEASE_TABLET | Freq: Two times a day (BID) | ORAL | 6 refills | Status: DC

## 2018-08-19 DIAGNOSIS — R0902 Hypoxemia: Secondary | ICD-10-CM | POA: Diagnosis not present

## 2018-08-19 DIAGNOSIS — I10 Essential (primary) hypertension: Secondary | ICD-10-CM | POA: Diagnosis not present

## 2018-08-19 DIAGNOSIS — R457 State of emotional shock and stress, unspecified: Secondary | ICD-10-CM | POA: Diagnosis not present

## 2018-08-19 DIAGNOSIS — R0602 Shortness of breath: Secondary | ICD-10-CM | POA: Diagnosis not present

## 2018-08-21 ENCOUNTER — Encounter: Payer: Self-pay | Admitting: Family Medicine

## 2018-08-21 ENCOUNTER — Other Ambulatory Visit: Payer: Self-pay | Admitting: General Practice

## 2018-08-21 MED ORDER — ALBUTEROL SULFATE HFA 108 (90 BASE) MCG/ACT IN AERS: 2.0000 | INHALATION_SPRAY | Freq: Four times a day (QID) | RESPIRATORY_TRACT | 2 refills | Status: DC | PRN

## 2018-08-21 NOTE — Telephone Encounter (Signed)
Please advise 

## 2018-08-24 ENCOUNTER — Ambulatory Visit: Payer: Self-pay

## 2018-08-24 NOTE — Telephone Encounter (Signed)
Incoming call from Patient with complaint of Fainting x's 2 last week. This happened xs 2 last week.  Denies seizure activity while out. Patient reports having ILD   Patient is Oriented X3 .  On one occasion Patient was laying flowers on a grave.  The other occasion Patient had just walked into Cardiologist office.  Then transported to ED.   Denies injury to last two fainting episodes.   Denies neurologic Sx.  Denies GI Sx or any other Sx.  Patient is menopausal.  Patient  Reports that she notices that whenever she becomes anxious or in a hurry ,She needs her O2 for which she has an PRN order.  Patient states that O2 levels are in the upper 90's. She begins to feel shaky and clumsy.  Reports having headaches and vertigo more often.  Scheduled and appointment 08/26/18 Wed.  At 08: 15 am.  Reviewed, Care advice with Patient.  Patient voiced understanding.  Encouraged Patient to keep appointment.      Reason for Disposition . Simple fainting is a chronic symptom (has occurred multiple times)  Answer Assessment - Initial Assessment Questions 1. ONSET: "How long were you unconscious?" (minutes) "When did it happen?"     *No Answer* 2. CONTENT: "What happened during period of unconsciousness?" (e.g., seizure activity)      na 3. MENTAL STATUS: "Alert and oriented now?" (oriented x 3 = name, month, location)      Oriented.yes  4. TRIGGER: "What do you think caused the fainting?" "What were you doing just before you fainted?"  (e.g., exercise, sudden standing up, prolonged standing)     5. RECURRENT SYMPTOM: "Have you ever passed out before?" If so, ask: "When was the last time?" and "What happened that time?"      The last two  6. INJURY: "Did you sustain any injury during the fall?"      *No Answer* 7. CARDIAC SYMPTOMS: "Have you had any of the following symptoms: chest pain, difficulty breathing, palpitations?"     *No Answer* 8. NEUROLOGIC SYMPTOMS: "Have you had any of the following symptoms:  headache, numbness, vertigo, weakness?"     *No Answer* 9. GI SYMPTOMS: "Have you had any of the following symptoms: abdominal pain, vomiting, diarrhea, blood in stools?"     na 10. OTHER SYMPTOMS: "Do you have any other symptoms?"        11. PREGNANCY: "Is there any chance you are pregnant?" "When was your last menstrual period?"       mennopausal  Protocols used: [facility]

## 2018-08-26 ENCOUNTER — Encounter: Payer: Self-pay | Admitting: Family Medicine

## 2018-08-26 ENCOUNTER — Other Ambulatory Visit: Payer: Self-pay | Admitting: General Practice

## 2018-08-26 ENCOUNTER — Ambulatory Visit: Payer: Self-pay | Admitting: Family Medicine

## 2018-08-26 ENCOUNTER — Other Ambulatory Visit: Payer: Self-pay

## 2018-08-26 ENCOUNTER — Ambulatory Visit (INDEPENDENT_AMBULATORY_CARE_PROVIDER_SITE_OTHER): Payer: BLUE CROSS/BLUE SHIELD | Admitting: Family Medicine

## 2018-08-26 ENCOUNTER — Encounter: Payer: Self-pay | Admitting: General Practice

## 2018-08-26 VITALS — BP 120/84 | HR 83 | Temp 98.0°F | Resp 18 | Ht 64.0 in | Wt 235.0 lb

## 2018-08-26 DIAGNOSIS — R55 Syncope and collapse: Secondary | ICD-10-CM | POA: Diagnosis not present

## 2018-08-26 DIAGNOSIS — R42 Dizziness and giddiness: Secondary | ICD-10-CM | POA: Diagnosis not present

## 2018-08-26 DIAGNOSIS — E8881 Metabolic syndrome: Secondary | ICD-10-CM

## 2018-08-26 LAB — CBC WITH DIFFERENTIAL/PLATELET
BASOS PCT: 0.7 % (ref 0.0–3.0)
Basophils Absolute: 0.1 10*3/uL (ref 0.0–0.1)
EOS ABS: 0.2 10*3/uL (ref 0.0–0.7)
Eosinophils Relative: 1.7 % (ref 0.0–5.0)
HEMATOCRIT: 40.1 % (ref 36.0–46.0)
HEMOGLOBIN: 13 g/dL (ref 12.0–15.0)
LYMPHS ABS: 1.9 10*3/uL (ref 0.7–4.0)
LYMPHS PCT: 22 % (ref 12.0–46.0)
MCHC: 32.6 g/dL (ref 30.0–36.0)
MCV: 84.3 fl (ref 78.0–100.0)
Monocytes Absolute: 0.5 10*3/uL (ref 0.1–1.0)
Monocytes Relative: 6.3 % (ref 3.0–12.0)
Neutro Abs: 6 10*3/uL (ref 1.4–7.7)
Neutrophils Relative %: 69.3 % (ref 43.0–77.0)
PLATELETS: 335 10*3/uL (ref 150.0–400.0)
RBC: 4.75 Mil/uL (ref 3.87–5.11)
RDW: 15.1 % (ref 11.5–15.5)
WBC: 8.6 10*3/uL (ref 4.0–10.5)

## 2018-08-26 LAB — LIPID PANEL
CHOL/HDL RATIO: 4
Cholesterol: 224 mg/dL — ABNORMAL HIGH (ref 0–200)
HDL: 50.8 mg/dL (ref >39.00)
LDL Cholesterol: 138 mg/dL — ABNORMAL HIGH (ref 0–99)
NonHDL: 172.82
TRIGLYCERIDES: 172 mg/dL — AB (ref 0.0–149.0)
VLDL: 34.4 mg/dL (ref 0.0–40.0)

## 2018-08-26 LAB — HEPATIC FUNCTION PANEL
ALBUMIN: 4.5 g/dL (ref 3.5–5.2)
ALT: 10 U/L (ref 0–35)
AST: 13 U/L (ref 0–37)
Alkaline Phosphatase: 51 U/L (ref 39–117)
BILIRUBIN DIRECT: 0.1 mg/dL (ref 0.0–0.3)
TOTAL PROTEIN: 7.2 g/dL (ref 6.0–8.3)
Total Bilirubin: 0.3 mg/dL (ref 0.2–1.2)

## 2018-08-26 LAB — BASIC METABOLIC PANEL
BUN: 15 mg/dL (ref 6–23)
CO2: 28 meq/L (ref 19–32)
Calcium: 10 mg/dL (ref 8.4–10.5)
Chloride: 103 mEq/L (ref 96–112)
Creatinine, Ser: 0.77 mg/dL (ref 0.40–1.20)
GFR: 80.02 mL/min (ref >60.00)
Glucose, Bld: 120 mg/dL — ABNORMAL HIGH (ref 70–99)
POTASSIUM: 3.7 meq/L (ref 3.5–5.1)
Sodium: 144 mEq/L (ref 135–145)

## 2018-08-26 LAB — HEMOGLOBIN A1C: HEMOGLOBIN A1C: 5.2 % (ref 4.6–6.5)

## 2018-08-26 LAB — TSH: TSH: 0.71 u[IU]/mL (ref 0.35–4.50)

## 2018-08-26 MED ORDER — PROMETHAZINE HCL 25 MG PO TABS: 25.0000 mg | ORAL_TABLET | Freq: Three times a day (TID) | ORAL | 0 refills | Status: DC | PRN

## 2018-08-26 MED ORDER — MECLIZINE HCL 25 MG PO TABS: 25.0000 mg | ORAL_TABLET | Freq: Three times a day (TID) | ORAL | 0 refills | Status: DC | PRN

## 2018-08-26 MED ORDER — BUTALBITAL-ASA-CAFF-CODEINE 50-325-40-30 MG PO CAPS: ORAL_CAPSULE | ORAL | 0 refills | Status: DC

## 2018-08-26 NOTE — Patient Instructions (Signed)
Follow up as needed or as scheduled We'll notify you of your lab results and make any changes if needed Make sure you are staying well hydrated- LOTS of water Make sure you are eating regularly throughout the day.  A lot of your symptoms sounds like low blood sugar Use the Meclizine as needed for vertigo We'll call you with your Neuro appt Call with any questions or concerns Hang in there! Happy Holidays!

## 2018-08-26 NOTE — Progress Notes (Signed)
   Subjective:    Patient ID: Stacey Alvarez, female    DOB: November 26, 1955, 62 y.o.   MRN: [id number]  HPI Pre-syncopal episodes- pt has had 2 episodes where she becomes 'disoriented and out of it'.  Pt has hx of vertigo, 'it feels like that but worse'.  Was transported to ER on 11/25 from cardiology office due to SOB and confusion.  Happened again last week when she was putting flowers on parents' grave.  EMS arrived- she refused to go to hospital.  Vitals and EKG WNL.  No CP.  Pt has seen cardiology.  'i'm just not steady on my feet like I used to'.  + nausea, dizziness.  Pt admits to not eating regularly and when I reviewed the sxs of hypoglycemia she felt these matched her sxs.   Review of Systems For ROS see HPI     Objective:   Physical Exam  Constitutional: She is oriented to person, place, and time. She appears well-developed and well-nourished. No distress.  obese  HENT:  Head: Normocephalic and atraumatic.  Mouth/Throat: Uvula is midline and mucous membranes are normal.  TMs WNL No TTP over sinuses Minimal nasal congestion  Eyes: Pupils are equal, round, and reactive to light. Conjunctivae and EOM are normal.  2-3 beats of horizontal nystagmus  Neck: Normal range of motion. Neck supple.  Cardiovascular: Normal rate, regular rhythm, normal heart sounds and intact distal pulses.  Pulmonary/Chest: Effort normal and breath sounds normal. No respiratory distress. She has no wheezes. She has no rales.  Musculoskeletal: She exhibits no edema.  Lymphadenopathy:    She has no cervical adenopathy.  Neurological: She is alert and oriented to person, place, and time. She has normal reflexes. No cranial nerve deficit.  Skin: Skin is warm and dry.  Psychiatric: She has a normal mood and affect. Her behavior is normal. Judgment and thought content normal.  Vitals reviewed.         Assessment & Plan:  Syncope/presyncope- new.  Suspect pt's current episodes are multifactorial- anxiety,  vertigo, hypoglycemia.  Check labs to r/o metabolic cause.  Recent cardiac w/u has been WNL.  Will refer to neuro for complete evaluation.  Pt expressed understanding and is in agreement w/ plan.

## 2018-08-26 NOTE — Assessment & Plan Note (Signed)
Check labs and determine if pt's sugar is the cause of her recent sxs.  I suspect she is having hypoglycemia from not eating regularly rather than diabetes.  Encouraged her to eat more regularly and carry snacks w/ her.

## 2018-08-26 NOTE — Telephone Encounter (Signed)
I am not sure who needs to get this feedback, but multiple syncopal episodes- including transport to ED- is more than a 15 minute visit

## 2018-08-26 NOTE — Assessment & Plan Note (Signed)
New to provider, recurrent problem for pt.  She has hx of this but feels current sxs are worse.  + nystagmus on exam today but otherwise neuro exam WNL.  Restart Meclizine.  Refer to Neuro for a complete w/u of her dizziness.  Pt expressed understanding and is in agreement w/ plan.

## 2018-08-31 NOTE — Unmapped (Signed)
[id number] states has 4 weeks supply,advised her to call back if needed before 3 weeks-CB

## 2018-09-01 ENCOUNTER — Encounter: Payer: Self-pay | Admitting: Family Medicine

## 2018-09-02 ENCOUNTER — Encounter: Payer: Self-pay | Admitting: Family Medicine

## 2018-09-03 DIAGNOSIS — G4733 Obstructive sleep apnea (adult) (pediatric): Secondary | ICD-10-CM | POA: Diagnosis not present

## 2018-09-10 ENCOUNTER — Emergency Department (HOSPITAL_COMMUNITY)
Admission: EM | Admit: 2018-09-10 | Discharge: 2018-09-11 | Disposition: A | Discharge status: Home / Self Care | Payer: BLUE CROSS/BLUE SHIELD | Attending: Emergency Medicine | Admitting: Emergency Medicine

## 2018-09-10 DIAGNOSIS — Y998 Other external cause status: Secondary | ICD-10-CM | POA: Diagnosis not present

## 2018-09-10 DIAGNOSIS — S8992XA Unspecified injury of left lower leg, initial encounter: Secondary | ICD-10-CM | POA: Diagnosis not present

## 2018-09-10 DIAGNOSIS — W010XXA Fall on same level from slipping, tripping and stumbling without subsequent striking against object, initial encounter: Secondary | ICD-10-CM | POA: Diagnosis not present

## 2018-09-10 DIAGNOSIS — Y939 Activity, unspecified: Secondary | ICD-10-CM | POA: Diagnosis not present

## 2018-09-10 DIAGNOSIS — E785 Hyperlipidemia, unspecified: Secondary | ICD-10-CM | POA: Diagnosis not present

## 2018-09-10 DIAGNOSIS — Y929 Unspecified place or not applicable: Secondary | ICD-10-CM | POA: Insufficient documentation

## 2018-09-10 DIAGNOSIS — Z79899 Other long term (current) drug therapy: Secondary | ICD-10-CM | POA: Insufficient documentation

## 2018-09-10 DIAGNOSIS — I2510 Atherosclerotic heart disease of native coronary artery without angina pectoris: Secondary | ICD-10-CM | POA: Diagnosis not present

## 2018-09-10 DIAGNOSIS — I10 Essential (primary) hypertension: Secondary | ICD-10-CM | POA: Diagnosis not present

## 2018-09-10 DIAGNOSIS — Z87891 Personal history of nicotine dependence: Secondary | ICD-10-CM | POA: Diagnosis not present

## 2018-09-10 DIAGNOSIS — S82832A Other fracture of upper and lower end of left fibula, initial encounter for closed fracture: Secondary | ICD-10-CM | POA: Diagnosis not present

## 2018-09-10 DIAGNOSIS — Q7291 Unspecified reduction defect of right lower limb: Secondary | ICD-10-CM

## 2018-09-10 DIAGNOSIS — S82892A Other fracture of left lower leg, initial encounter for closed fracture: Secondary | ICD-10-CM

## 2018-09-10 DIAGNOSIS — S9305XA Dislocation of left ankle joint, initial encounter: Secondary | ICD-10-CM | POA: Insufficient documentation

## 2018-09-10 DIAGNOSIS — Q7292 Unspecified reduction defect of left lower limb: Secondary | ICD-10-CM

## 2018-09-10 DIAGNOSIS — W19XXXA Unspecified fall, initial encounter: Secondary | ICD-10-CM

## 2018-09-10 DIAGNOSIS — Y92009 Unspecified place in unspecified non-institutional (private) residence as the place of occurrence of the external cause: Secondary | ICD-10-CM

## 2018-09-11 ENCOUNTER — Encounter (HOSPITAL_COMMUNITY): Payer: Self-pay | Admitting: Emergency Medicine

## 2018-09-11 ENCOUNTER — Emergency Department (HOSPITAL_COMMUNITY): Payer: BLUE CROSS/BLUE SHIELD

## 2018-09-11 ENCOUNTER — Telehealth: Payer: Self-pay | Admitting: Emergency Medicine

## 2018-09-11 MED ORDER — PROPOFOL 10 MG/ML IV BOLUS
0.5000 mg/kg | Freq: Once | INTRAVENOUS | Status: DC
  Filled 2018-09-11 (×2): qty 20

## 2018-09-11 MED ORDER — PROPOFOL 10 MG/ML IV BOLUS
INTRAVENOUS | Status: AC | PRN
  Administered 2018-09-11: 54.5 mg via INTRAVENOUS

## 2018-09-11 MED ORDER — FENTANYL CITRATE (PF) 100 MCG/2ML IJ SOLN
INTRAMUSCULAR | Status: AC | PRN
  Administered 2018-09-11: 50 ug via INTRAVENOUS

## 2018-09-11 MED ORDER — FENTANYL CITRATE (PF) 100 MCG/2ML IJ SOLN
50.0000 ug | INTRAMUSCULAR | Status: DC | PRN
  Administered 2018-09-11: 50 ug via INTRAVENOUS
  Filled 2018-09-11: qty 2

## 2018-09-11 MED ORDER — OXYCODONE-ACETAMINOPHEN 5-325 MG PO TABS: 1.0000 | ORAL_TABLET | ORAL | 0 refills | Status: DC | PRN

## 2018-09-11 MED ORDER — ONDANSETRON HCL 4 MG/2ML IJ SOLN
4.0000 mg | Freq: Once | INTRAMUSCULAR | Status: AC
  Administered 2018-09-11: 4 mg via INTRAVENOUS
  Filled 2018-09-11: qty 2

## 2018-09-11 MED ORDER — FENTANYL CITRATE (PF) 100 MCG/2ML IJ SOLN
50.0000 ug | Freq: Once | INTRAMUSCULAR | Status: AC
  Administered 2018-09-11: 50 ug via INTRAVENOUS
  Filled 2018-09-11: qty 2

## 2018-09-11 NOTE — Telephone Encounter (Signed)
Copied from [place] [id number]. Topic: General - Other >> Sep 11, 2018 11:21 AM Alanda Slim E wrote: Reason for CRM: [name] called due to Pt going to ER. Pt broke the bottom shin bone, two bones in left foot and dislocated ankle. Pt ankle was reset at the ER. Pt was told to reach out to PCP for coordination with orthopedic Surgeon(Dr. Melony Overly).   Pt daughter is not able to care for or lift Pt and would like information on home health aid or assisted living during recovery. / please advise

## 2018-09-11 NOTE — ED Triage Notes (Signed)
  Patient BIB EMS after a fall at home.  Patient has hx of vertigo and states she has fallen a lot recently.  Patient states she fell three times today and the last time she rolled her ankle and felt a pop.  Patient has swelling around L ankle, bilateral weak pulses, no color or temp change.  Patient was given 200 mcg of fentanyl via EMS and has no complaints of pain.  Patient wears O2 at home and is on 2L at the moment.  Patient A&O x4.

## 2018-09-11 NOTE — ED Provider Notes (Signed)
[facility] EMERGENCY DEPARTMENT Provider Note   CSN: [id number] Arrival date & time: 09/10/18  2359     History   Chief Complaint Chief Complaint  Patient presents with  . Fall  . Ankle Pain    HPI Stacey Alvarez is a 62 y.o. female.  The history is provided by the patient.  Fall   Ankle Pain    She has history of hypertension, hyperlipidemia, depression, fibromyalgia, interstitial lung disease, GERD, vertigo and comes in having fallen and injured her left ankle.  She had an attack of vertigo and fell 2 times.  The second time, she felt something snap in her left ankle.  She denies other injury.  Past Medical History:  Diagnosis Date  . Allergy   . Anxiety   . Arthritis    KNEES,BACK,HIPS  . Depression   . Fibromyalgia   . GERD (gastroesophageal reflux disease)   . Hyperlipidemia   . Hypertension   . IBS (irritable bowel syndrome)   . Interstitial lung disease ([place])   . Sleep apnea     Patient Active Problem List   Diagnosis Date Noted  . Vertigo 08/26/2018  . ILD (interstitial lung disease) ([place]) 10/01/2016  . Atherosclerosis of native coronary artery without angina pectoris 10/01/2016  . Chronic cough 04/05/2016  . Incidental lung nodule, > 59mm and < 29mm 04/05/2016  . History of sleep apnea 04/05/2016  . Back pain 02/07/2016  . Severe obesity (BMI >= 40) ([place]) 11/24/2015  . Hyperlipidemia 05/24/2015  . Diverticulitis 01/09/2015  . Routine general medical examination at a health care facility 11/21/2014  . HTN (hypertension) 10/28/2013  . OSA (obstructive sleep apnea) 10/28/2013  . Insulin resistance 10/28/2013  . GERD (gastroesophageal reflux disease) 10/28/2013  . Depression with anxiety 10/28/2013  . Postmenopausal HRT (hormone replacement therapy) 10/28/2013    Past Surgical History:  Procedure Laterality Date  . CHOLECYSTECTOMY    . COLONOSCOPY    . PARTIAL HYSTERECTOMY  1999  . POLYPECTOMY       OB History   No  obstetric history on file.      Home Medications    Prior to Admission medications   Medication Sig Start Date End Date Taking? Authorizing Provider  albuterol (PROVENTIL HFA;VENTOLIN HFA) 108 (90 Base) MCG/ACT inhaler Inhale 2 puffs into the lungs every 6 (six) hours as needed for wheezing or shortness of breath. 08/21/18   Midge Minium, MD  ALPRAZolam Duanne Moron) 1 MG tablet 0.5 mg in the am and 2mg  at night 11/01/14   [provider]  amLODipine (NORVASC) 5 MG tablet TAKE 1/2 TABLET BY MOUTH IN THE MORNING AND 1 TABLET BY MOUTH AT BEDTIME 03/02/18   Skeet Latch, MD  Biotin 10 MG CAPS Take by mouth.    [provider]  butalbital-aspirin-caffeine-codeine Acquanetta Chain WITH CODEINE) [phone] MG capsule TAKE 1 CAPSULE BY MOUTH EVERY 8 HOURS AS NEEDED FOR PAIN 08/26/18   Midge Minium, MD  cetirizine (ZYRTEC) 10 MG tablet Take 10 mg by mouth daily.    [provider]  fenofibrate 160 MG tablet TAKE 1 TABLET(160 MG) BY MOUTH DAILY 06/23/18   Midge Minium, MD  FLUoxetine (PROZAC) 20 MG capsule TK 3 CS PO QD 05/06/18   [provider]  FLUoxetine (PROZAC) 40 MG capsule Take 80 mg by mouth daily. 03/05/18   [provider]  fluticasone (FLONASE) 50 MCG/ACT nasal spray Place 1 spray into both nostrils daily.    [provider]  lamoTRIgine (LAMICTAL) 100 MG tablet Take 100 mg by mouth daily.    [provider]  levothyroxine (SYNTHROID, LEVOTHROID) 50 MCG tablet Take 1 tablet (50 mcg total) by mouth daily. Patient not taking: Reported on 08/26/2018 05/22/18   Midge Minium, MD  lisinopril (PRINIVIL,ZESTRIL) 20 MG tablet TAKE 1 TABLET(20 MG) BY MOUTH TWICE DAILY 06/17/18   Midge Minium, MD  meclizine (ANTIVERT) 25 MG tablet Take 1 tablet (25 mg total) by mouth 3 (three) times daily as needed for dizziness. 08/26/18   Midge Minium, MD  mycophenolate (CELLCEPT) 500 MG tablet Take 500 mg by mouth 2 (two)  times daily.  07/23/17   [provider]  Omega-3 1000 MG CAPS Take by mouth.    [provider]  OXYGEN Inhale 2 L into the lungs as needed (SOB).    [provider]  pantoprazole (PROTONIX) 40 MG tablet Take 1 tablet (40 mg total) by mouth 2 (two) times daily. 08/12/18   Midge Minium, MD  pravastatin (PRAVACHOL) 40 MG tablet Take 1 tablet (40 mg total) by mouth daily. 05/21/18   Midge Minium, MD  predniSONE (DELTASONE) 10 MG tablet Take 2.5 mg by mouth daily with breakfast.  04/05/17   [provider]  promethazine (PHENERGAN) 25 MG tablet Take 1 tablet (25 mg total) by mouth every 8 (eight) hours as needed for nausea or vomiting. 08/26/18   Midge Minium, MD  temazepam (RESTORIL) 30 MG capsule Take 30 mg by mouth at bedtime.    [provider]    Family History Family History  Problem Relation Age of Onset  . Diabetes Mother   . Heart disease Mother   . Anxiety disorder Mother   . Depression Mother   . Kidney disease Mother   . Diabetes Father   . Heart disease Father   . Anxiety disorder Father   . Depression Father   . Diabetes Paternal Grandmother   . Cystic fibrosis Son   . Alcohol abuse Son   . Breast cancer Maternal Aunt     Social History Social History   Tobacco Use  . Smoking status: Former Research scientist (life sciences)  . Smokeless tobacco: Never Used  . Tobacco comment: smoked 2 years in late teens about 8-10 cigs daily.   Substance Use Topics  . Alcohol use: No    Alcohol/week: 0.0 standard drinks    Comment: OCC  . Drug use: No     Allergies   Macrobid [nitrofurantoin monohyd macro] and Sulfa antibiotics   Review of Systems Review of Systems  All other systems reviewed and are negative.    Physical Exam Updated Vital Signs BP 124/86 (BP Location: Right Arm)   Pulse 71   Temp 98 F (36.7 C) (Oral)   Resp 18   Ht 5\' 4"  (1.626 m)   Wt 108.9 kg   SpO2 100%   BMI 41.20 kg/m   Physical Exam Vitals signs  and nursing note reviewed.    62 year old female, resting comfortably and in no acute distress. Vital signs are normal. Oxygen saturation is 100%, which is normal. Head is normocephalic and atraumatic. PERRLA, EOMI. Oropharynx is clear. Neck is nontender and supple without adenopathy or JVD. Back is nontender and there is no CVA tenderness. Lungs are clear without rales, wheezes, or rhonchi. Chest is nontender. Heart has regular rate and rhythm without murmur. Abdomen is soft, flat, nontender without masses or hepatosplenomegaly and peristalsis is normoactive. Extremities: Moderate soft  tissue swelling noted around the left ankle.  There is instability the ankle mortise.  There is pain with passive range of motion.  Distal neurovascular exam is intact with strong pulses, prompt capillary refill, normal sensation. Skin is warm and dry without rash. Neurologic: Mental status is normal, cranial nerves are intact, there are no motor or sensory deficits.  ED Treatments / Results  Labs (all labs ordered are listed, but only abnormal results are displayed) Labs Reviewed - No data to display  EKG None  Radiology Dg Ankle Complete Left  Result Date: 09/11/2018 CLINICAL DATA:  Status post 3 falls, with left ankle pain, swelling and bruising. Obvious left ankle deformity. Initial encounter. EXAM: LEFT ANKLE COMPLETE - 3+ VIEW COMPARISON:  None. FINDINGS: There is a laterally displaced fracture of the distal fibula, with lateral dislocation of the talus. No definite medial malleolar or posterior malleolar fracture is seen. Scattered accessory ossicles are noted at the distal Achilles tendon. A plantar calcaneal spur is seen. The subtalar joint is grossly unremarkable. Diffuse soft tissue swelling is noted about the ankle. IMPRESSION: Laterally displaced fracture of the distal fibula, with lateral dislocation of the talus. Electronically Signed   By: Garald Balding M.D.   On: 09/11/2018 04:01   Dg  Ankle Left Port  Result Date: 09/11/2018 CLINICAL DATA:  Status post reduction of left ankle fracture/dislocation. Initial encounter. EXAM: PORTABLE LEFT ANKLE - 2 VIEW COMPARISON:  Left ankle radiographs performed earlier today at 3:34 a.m. FINDINGS: There has been reduction of the talar dislocation. There is mild residual lateral displacement of the distal fibular fracture, and mild residual medial widening of the ankle mortise. Residual apparent mild misalignment of the anterior facet of the subtalar joint may reflect underlying ligamentous injury. No new fractures are seen. A plantar calcaneal spur is noted. The soft tissues are difficult to fully assess due to the overlying splint. IMPRESSION: 1. Successful reduction of talar dislocation. Mild residual lateral displacement of the distal fibular fracture, and mild residual medial widening of the ankle mortise. 2. Residual apparent mild misalignment of the anterior facet of the subtalar joint may reflect underlying ligamentous injury. Electronically Signed   By: Garald Balding M.D.   On: 09/11/2018 06:15   Dg Foot Complete Left  Result Date: 09/11/2018 CLINICAL DATA:  Status post fall, with left ankle pain, swelling, bruising and deformity. Initial encounter. EXAM: LEFT FOOT - COMPLETE 3+ VIEW COMPARISON:  None. FINDINGS: There is lateral displacement of a distal fibular fracture, and lateral anterior dislocation of the talus. Diffuse surrounding soft tissue swelling is noted. There appears to be misalignment of the calcaneus and cuboid at the anterior facet of the subtalar joint; this may reflect underlying ligamentous injury, but is difficult to fully characterize. Accessory ossicles are noted at the distal Achilles tendon. A small plantar calcaneal spur is seen. The subtalar joint is grossly unremarkable. IMPRESSION: 1. Lateral displacement of a distal fibular fracture, and lateral anterior dislocation of the talus. 2. Apparent misalignment of the  calcaneus and cuboid at the anterior facet of the subtalar joint; this may reflect underlying ligamentous injury, but is difficult to fully characterize. Electronically Signed   By: Garald Balding M.D.   On: 09/11/2018 04:08    Procedures .Sedation Date/Time: 09/11/2018 5:51 AM Performed by: Delora Fuel, MD Authorized by: Delora Fuel, MD   Consent:    Consent obtained:  Verbal   Consent given by:  Patient   Risks discussed:  Allergic reaction, dysrhythmia, inadequate sedation, nausea,  prolonged hypoxia resulting in organ damage, prolonged sedation necessitating reversal, respiratory compromise necessitating ventilatory assistance and intubation and vomiting   Alternatives discussed:  Analgesia without sedation, anxiolysis and regional anesthesia Universal protocol:    Procedure explained and questions answered to patient or proxy's satisfaction: yes     Relevant documents present and verified: yes     Test results available and properly labeled: yes     Imaging studies available: yes     Required blood products, implants, devices, and special equipment available: yes     Site/side marked: yes     Immediately prior to procedure a time out was called: yes     Patient identity confirmation method:  Verbally with patient and arm band Indications:    Procedure performed:  Fracture reduction   Procedure necessitating sedation performed by:  Physician performing sedation Pre-sedation assessment:    Time since last food or drink:  6 hours   ASA classification: class 1 - normal, healthy patient     Neck mobility: normal     Mouth opening:  3 or more finger widths   Thyromental distance:  4 finger widths   Mallampati score:  I - soft palate, uvula, fauces, pillars visible   Pre-sedation assessments completed and reviewed: airway patency, cardiovascular function, hydration status, mental status, nausea/vomiting, pain level, respiratory function and temperature     Pre-sedation assessment  completed:  09/11/2018 5:30 AM Immediate pre-procedure details:    Reassessment: Patient reassessed immediately prior to procedure     Reviewed: vital signs, relevant labs/tests and NPO status     Verified: bag valve mask available, emergency equipment available, intubation equipment available, IV patency confirmed, oxygen available and suction available   Procedure details (see MAR for exact dosages):    Preoxygenation:  Nasal cannula   Sedation:  Propofol   Intra-procedure monitoring:  Blood pressure monitoring, cardiac monitor, continuous pulse oximetry, frequent LOC assessments, frequent vital sign checks and continuous capnometry   Intra-procedure events: none     Total Provider sedation time (minutes):  30 Post-procedure details:    Post-sedation assessment completed:  09/11/2018 6:00 AM   Attendance: Constant attendance by certified staff until patient recovered     Recovery: Patient returned to pre-procedure baseline     Post-sedation assessments completed and reviewed: airway patency, cardiovascular function, hydration status, mental status, nausea/vomiting, pain level, respiratory function and temperature     Patient is stable for discharge or admission: yes     Patient tolerance:  Tolerated well, no immediate complications  Reduction of fracture Date/Time: 09/11/2018 5:40 AM Performed by: Delora Fuel, MD Authorized by: Delora Fuel, MD  Consent: Verbal consent obtained. Written consent not obtained. Risks and benefits: risks, benefits and alternatives were discussed Consent given by: patient Patient understanding: patient states understanding of the procedure being performed Patient consent: the patient's understanding of the procedure matches consent given Procedure consent: procedure consent matches procedure scheduled Relevant documents: relevant documents present and verified Test results: test results available and properly labeled Site marked: the operative site was  marked Imaging studies: imaging studies available Required items: required blood products, implants, devices, and special equipment available Patient identity confirmed: verbally with patient and arm band Time out: Immediately prior to procedure a "time out" was called to verify the correct patient, procedure, equipment, support staff and site/side marked as required. Local anesthesia used: no  Anesthesia: Local anesthesia used: no  Sedation: Patient sedated: yes Sedation type: moderate (conscious) sedation Sedatives: propofol Analgesia: fentanyl Sedation start  date/time: 09/11/2018 5:30 AM Sedation end date/time: 09/11/2018 6:00 AM Vitals: Vital signs were monitored during sedation.  Patient tolerance: Patient tolerated the procedure well with no immediate complications Comments: Dislocation reduced by manual traction. Post procedure x-ray ordered  .Splint Application Date/Time: 81/27/5170 5:58 AM Performed by: Delora Fuel, MD Authorized by: Delora Fuel, MD   Consent:    Consent obtained:  Verbal   Consent given by:  Patient   Risks discussed:  Discoloration, numbness, pain and swelling   Alternatives discussed:  No treatment Pre-procedure details:    Sensation:  Normal   Skin color:  Normal Procedure details:    Laterality:  Left   Location:  Ankle   Ankle:  L ankle   Strapping: no     Splint type:  Ankle stirrup (plus posterior short leg)   Supplies:  Ortho-Glass and elastic bandage Post-procedure details:    Pain:  Improved   Sensation:  Normal   Skin color:  Normal   Patient tolerance of procedure:  Tolerated well, no immediate complications Comments:     Splint applied jointly by myself and orthopedic technician    Medications Ordered in ED Medications  fentaNYL (SUBLIMAZE) injection 50 mcg (50 mcg Intravenous Given 09/11/18 0406)  fentaNYL (SUBLIMAZE) injection 50 mcg (has no administration in time range)  propofol (DIPRIVAN) 10 mg/mL bolus/IV push  54.5 mg (has no administration in time range)  ondansetron (ZOFRAN) injection 4 mg (4 mg Intravenous Given 09/11/18 0406)     Initial Impression / Assessment and Plan / ED Course  I have reviewed the triage vital signs and the nursing notes.  Pertinent labs & imaging results that were available during my care of the patient were reviewed by me and considered in my medical decision making (see chart for details).  Fracture dislocation of left ankle.  Under procedural sedation, ankle was reduced and splint applied.  She is referred to orthopedics for elective open reduction and internal fixation.  Old records are reviewed, old records are reviewed, and she does have a prior ED visit for fall.    Postreduction x-rays show satisfactory alignment.  I have discussed the case with Dr. Berenice Primas, who is her personal orthopedic physician.  He requests patient be seen by his associate Dr. Lucia Gaskins, who is a foot and ankle specialist.  They will make arrangements for prompt follow-up and elective surgical management.  Final Clinical Impressions(s) / ED Diagnoses   Final diagnoses:  Reduction deformity of right leg  Fall in home, initial encounter  Closed fracture dislocation of left ankle, initial encounter    ED Discharge Orders         Ordered    oxyCODONE-acetaminophen (PERCOCET) 5-325 MG tablet  Every 4 hours PRN     09/11/18 0174           Delora Fuel, MD 94/49/67 [phone]

## 2018-09-11 NOTE — Progress Notes (Addendum)
RT at bedside for conscious sedation procedure.  Pt currently on 2L [place], ETCO2 was between 45 -31cmH20 from start of procedure to finish. Pt tolerated well.

## 2018-09-11 NOTE — Telephone Encounter (Signed)
LVM for pts daughter to call back and discuss.  

## 2018-09-11 NOTE — Telephone Encounter (Signed)
Spoke with pts daughter to inform.  

## 2018-09-11 NOTE — Telephone Encounter (Signed)
The ER note indicates that her case was already discussed w/ Dr Berenice Primas and that they would arrange her f/u w/ Dr Lucia Gaskins at his office.  If needed, we can place a stat referral to [facility] for L ankle fracture/dislocation.  This usually requires surgery and all home health or rehab placement will come from the orthopedic team

## 2018-09-11 NOTE — Discharge Instructions (Addendum)
Apply ice and keep your leg elevated as much as possible.  You may take acetaminophen and/or ibuprofen as needed for less severe pain.  Taking acetaminophen plus ibuprofen together gives you better pain relief than either one by itself.  If you have to take the oxycodone-acetaminophen for pain, do not take acetaminophen in addition to that.  You may take ibuprofen in addition to the oxycodone-acetaminophen.

## 2018-09-12 ENCOUNTER — Emergency Department (HOSPITAL_COMMUNITY): Payer: BLUE CROSS/BLUE SHIELD

## 2018-09-12 ENCOUNTER — Other Ambulatory Visit: Payer: Self-pay

## 2018-09-12 ENCOUNTER — Inpatient Hospital Stay (HOSPITAL_COMMUNITY)
Admission: EM | Admit: 2018-09-12 | Discharge: 2018-09-17 | DRG: 493 | Disposition: A | Discharge status: Skilled Nursing Facility | Payer: BLUE CROSS/BLUE SHIELD | Attending: Internal Medicine | Admitting: Internal Medicine

## 2018-09-12 ENCOUNTER — Encounter (HOSPITAL_COMMUNITY): Payer: Self-pay | Admitting: *Deleted

## 2018-09-12 DIAGNOSIS — Z87891 Personal history of nicotine dependence: Secondary | ICD-10-CM

## 2018-09-12 DIAGNOSIS — Z9981 Dependence on supplemental oxygen: Secondary | ICD-10-CM

## 2018-09-12 DIAGNOSIS — Z803 Family history of malignant neoplasm of breast: Secondary | ICD-10-CM | POA: Diagnosis not present

## 2018-09-12 DIAGNOSIS — Z833 Family history of diabetes mellitus: Secondary | ICD-10-CM

## 2018-09-12 DIAGNOSIS — Z90710 Acquired absence of both cervix and uterus: Secondary | ICD-10-CM

## 2018-09-12 DIAGNOSIS — Z8249 Family history of ischemic heart disease and other diseases of the circulatory system: Secondary | ICD-10-CM

## 2018-09-12 DIAGNOSIS — S82892A Other fracture of left lower leg, initial encounter for closed fracture: Secondary | ICD-10-CM | POA: Diagnosis present

## 2018-09-12 DIAGNOSIS — R42 Dizziness and giddiness: Secondary | ICD-10-CM

## 2018-09-12 DIAGNOSIS — Z86711 Personal history of pulmonary embolism: Secondary | ICD-10-CM

## 2018-09-12 DIAGNOSIS — Z881 Allergy status to other antibiotic agents status: Secondary | ICD-10-CM

## 2018-09-12 DIAGNOSIS — Z7951 Long term (current) use of inhaled steroids: Secondary | ICD-10-CM | POA: Diagnosis not present

## 2018-09-12 DIAGNOSIS — S82892K Other fracture of left lower leg, subsequent encounter for closed fracture with nonunion: Secondary | ICD-10-CM | POA: Diagnosis not present

## 2018-09-12 DIAGNOSIS — Z818 Family history of other mental and behavioral disorders: Secondary | ICD-10-CM

## 2018-09-12 DIAGNOSIS — Z9049 Acquired absence of other specified parts of digestive tract: Secondary | ICD-10-CM | POA: Diagnosis not present

## 2018-09-12 DIAGNOSIS — I10 Essential (primary) hypertension: Secondary | ICD-10-CM | POA: Diagnosis not present

## 2018-09-12 DIAGNOSIS — S82842A Displaced bimalleolar fracture of left lower leg, initial encounter for closed fracture: Secondary | ICD-10-CM | POA: Diagnosis not present

## 2018-09-12 DIAGNOSIS — R0603 Acute respiratory distress: Secondary | ICD-10-CM | POA: Diagnosis not present

## 2018-09-12 DIAGNOSIS — E785 Hyperlipidemia, unspecified: Secondary | ICD-10-CM | POA: Diagnosis not present

## 2018-09-12 DIAGNOSIS — M25572 Pain in left ankle and joints of left foot: Secondary | ICD-10-CM | POA: Diagnosis not present

## 2018-09-12 DIAGNOSIS — F418 Other specified anxiety disorders: Secondary | ICD-10-CM | POA: Diagnosis not present

## 2018-09-12 DIAGNOSIS — S90852A Superficial foreign body, left foot, initial encounter: Secondary | ICD-10-CM | POA: Diagnosis not present

## 2018-09-12 DIAGNOSIS — E669 Obesity, unspecified: Secondary | ICD-10-CM | POA: Diagnosis present

## 2018-09-12 DIAGNOSIS — S9306XA Dislocation of unspecified ankle joint, initial encounter: Secondary | ICD-10-CM | POA: Diagnosis present

## 2018-09-12 DIAGNOSIS — G4700 Insomnia, unspecified: Secondary | ICD-10-CM | POA: Diagnosis not present

## 2018-09-12 DIAGNOSIS — G4733 Obstructive sleep apnea (adult) (pediatric): Secondary | ICD-10-CM | POA: Diagnosis present

## 2018-09-12 DIAGNOSIS — W19XXXA Unspecified fall, initial encounter: Secondary | ICD-10-CM | POA: Diagnosis present

## 2018-09-12 DIAGNOSIS — R609 Edema, unspecified: Secondary | ICD-10-CM | POA: Diagnosis not present

## 2018-09-12 DIAGNOSIS — K219 Gastro-esophageal reflux disease without esophagitis: Secondary | ICD-10-CM | POA: Diagnosis present

## 2018-09-12 DIAGNOSIS — Z7952 Long term (current) use of systemic steroids: Secondary | ICD-10-CM | POA: Diagnosis not present

## 2018-09-12 DIAGNOSIS — J849 Interstitial pulmonary disease, unspecified: Secondary | ICD-10-CM | POA: Diagnosis not present

## 2018-09-12 DIAGNOSIS — Z841 Family history of disorders of kidney and ureter: Secondary | ICD-10-CM | POA: Diagnosis not present

## 2018-09-12 DIAGNOSIS — Z882 Allergy status to sulfonamides status: Secondary | ICD-10-CM

## 2018-09-12 DIAGNOSIS — Z86718 Personal history of other venous thrombosis and embolism: Secondary | ICD-10-CM

## 2018-09-12 DIAGNOSIS — Z419 Encounter for procedure for purposes other than remedying health state, unspecified: Secondary | ICD-10-CM

## 2018-09-12 DIAGNOSIS — I2510 Atherosclerotic heart disease of native coronary artery without angina pectoris: Secondary | ICD-10-CM | POA: Diagnosis not present

## 2018-09-12 DIAGNOSIS — Z6841 Body Mass Index (BMI) 40.0 and over, adult: Secondary | ICD-10-CM

## 2018-09-12 DIAGNOSIS — S9305XA Dislocation of left ankle joint, initial encounter: Secondary | ICD-10-CM | POA: Diagnosis not present

## 2018-09-12 DIAGNOSIS — M797 Fibromyalgia: Secondary | ICD-10-CM | POA: Diagnosis present

## 2018-09-12 LAB — APTT: aPTT: 33 seconds (ref 24–36)

## 2018-09-12 LAB — CBC WITH DIFFERENTIAL/PLATELET
Abs Immature Granulocytes: 0.03 10*3/uL (ref 0.00–0.07)
Basophils Absolute: 0 10*3/uL (ref 0.0–0.1)
Basophils Relative: 0 %
Eosinophils Absolute: 0.2 10*3/uL (ref 0.0–0.5)
Eosinophils Relative: 2 %
HCT: 35.2 % — ABNORMAL LOW (ref 36.0–46.0)
Hemoglobin: 10.8 g/dL — ABNORMAL LOW (ref 12.0–15.0)
IMMATURE GRANULOCYTES: 0 %
Lymphocytes Relative: 19 %
Lymphs Abs: 1.9 10*3/uL (ref 0.7–4.0)
MCH: 26.3 pg (ref 26.0–34.0)
MCHC: 30.7 g/dL (ref 30.0–36.0)
MCV: 85.9 fL (ref 80.0–100.0)
Monocytes Absolute: 0.6 10*3/uL (ref 0.1–1.0)
Monocytes Relative: 6 %
NEUTROS ABS: 7.1 10*3/uL (ref 1.7–7.7)
NEUTROS PCT: 73 %
Platelets: 286 10*3/uL (ref 150–400)
RBC: 4.1 MIL/uL (ref 3.87–5.11)
RDW: 14 % (ref 11.5–15.5)
WBC: 9.9 10*3/uL (ref 4.0–10.5)
nRBC: 0 % (ref 0.0–0.2)

## 2018-09-12 LAB — COMPREHENSIVE METABOLIC PANEL
ALT: 12 U/L (ref 0–44)
ANION GAP: 11 (ref 5–15)
AST: 12 U/L — ABNORMAL LOW (ref 15–41)
Albumin: 3.5 g/dL (ref 3.5–5.0)
Alkaline Phosphatase: 43 U/L (ref 38–126)
BUN: 13 mg/dL (ref 8–23)
CO2: 25 mmol/L (ref 22–32)
Calcium: 9.3 mg/dL (ref 8.9–10.3)
Chloride: 103 mmol/L (ref 98–111)
Creatinine, Ser: 0.9 mg/dL (ref 0.44–1.00)
GFR calc Af Amer: >60 mL/min (ref >60)
GFR calc non Af Amer: >60 mL/min (ref >60)
Glucose, Bld: 115 mg/dL — ABNORMAL HIGH (ref 70–99)
Potassium: 3.9 mmol/L (ref 3.5–5.1)
Sodium: 139 mmol/L (ref 135–145)
Total Bilirubin: 0.3 mg/dL (ref 0.3–1.2)
Total Protein: 6.3 g/dL — ABNORMAL LOW (ref 6.5–8.1)

## 2018-09-12 LAB — PROTIME-INR
INR: 1.07
Prothrombin Time: 13.8 seconds (ref 11.4–15.2)

## 2018-09-12 LAB — I-STAT CHEM 8, ED
BUN: 14 mg/dL (ref 8–23)
CALCIUM ION: 1.13 mmol/L — AB (ref 1.15–1.40)
Chloride: 103 mmol/L (ref 98–111)
Creatinine, Ser: 0.9 mg/dL (ref 0.44–1.00)
GLUCOSE: 109 mg/dL — AB (ref 70–99)
HCT: 34 % — ABNORMAL LOW (ref 36.0–46.0)
Hemoglobin: 11.6 g/dL — ABNORMAL LOW (ref 12.0–15.0)
Potassium: 3.8 mmol/L (ref 3.5–5.1)
Sodium: 137 mmol/L (ref 135–145)
TCO2: 24 mmol/L (ref 22–32)

## 2018-09-12 LAB — I-STAT TROPONIN, ED: Troponin i, poc: 0.01 ng/mL (ref 0.00–0.08)

## 2018-09-12 LAB — BRAIN NATRIURETIC PEPTIDE: B NATRIURETIC PEPTIDE 5: 30 pg/mL (ref 0.0–100.0)

## 2018-09-12 MED ORDER — ACETAMINOPHEN 500 MG PO TABS
1000.0000 mg | ORAL_TABLET | Freq: Once | ORAL | Status: DC
  Filled 2018-09-12: qty 2

## 2018-09-12 MED ORDER — IBUPROFEN 400 MG PO TABS
400.0000 mg | ORAL_TABLET | Freq: Once | ORAL | Status: DC
  Filled 2018-09-12: qty 1

## 2018-09-12 MED ORDER — IOPAMIDOL (ISOVUE-370) INJECTION 76%
100.0000 mL | Freq: Once | INTRAVENOUS | Status: AC | PRN
  Administered 2018-09-12: 100 mL via INTRAVENOUS

## 2018-09-12 MED ORDER — MORPHINE SULFATE (PF) 4 MG/ML IV SOLN
4.0000 mg | Freq: Once | INTRAVENOUS | Status: AC
  Administered 2018-09-13: 4 mg via INTRAVENOUS
  Filled 2018-09-12: qty 1

## 2018-09-12 NOTE — ED Provider Notes (Signed)
[facility] EMERGENCY DEPARTMENT Provider Note   CSN: [id number] Arrival date & time: 09/12/18  2113     History   Chief Complaint Chief Complaint  Patient presents with  . Shortness of Breath    HPI Stacey Alvarez is a 62 y.o. female.  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  Patient denies any acute chest pain or fevers or cough.  She denies any vomiting, diarrhea, dysuria, abdominal pain.  She states her pain in her left lower extremity has gotten worse over the last couple days and her p.o. pain medications have not significantly improved her pain.  Regarding her shortness of breath she denies any alleviating or aggravating factors include eating positional or exertional components.  She states this feels similar to her prior PEs.  Denies any other acute concerns including rash, sick contacts, recent travel out of [place], or other acute traumatic injuries.  Past Medical History:  Diagnosis Date  . Allergy   . Anxiety   . Arthritis    KNEES,BACK,HIPS  . Depression   . Fibromyalgia   . GERD (gastroesophageal reflux disease)   . Hyperlipidemia   . Hypertension   . IBS (irritable bowel syndrome)   . Interstitial lung disease ([place])   . Sleep apnea     Patient Active Problem List   Diagnosis Date Noted  . Closed left ankle fracture 09/13/2018  . Vertigo 08/26/2018  . ILD (interstitial lung disease) ([place]) 10/01/2016  . SOB (shortness of breath) 10/01/2016  . Atherosclerosis of native coronary artery without angina pectoris 10/01/2016  . Chronic cough 04/05/2016  . Incidental lung nodule, > 57mm and < 95mm 04/05/2016  . History of sleep apnea 04/05/2016  . Back pain  02/07/2016  . Severe obesity (BMI >= 40) ([place]) 11/24/2015  . Hyperlipidemia 05/24/2015  . Diverticulitis 01/09/2015  . Routine general medical examination at a health care facility 11/21/2014  . HTN (hypertension) 10/28/2013  . OSA (obstructive sleep apnea) 10/28/2013  . Insulin resistance 10/28/2013  . GERD (gastroesophageal reflux disease) 10/28/2013  . Depression with anxiety 10/28/2013  . Postmenopausal HRT (hormone replacement therapy) 10/28/2013    Past Surgical History:  Procedure Laterality Date  . CHOLECYSTECTOMY    . COLONOSCOPY    . PARTIAL HYSTERECTOMY  1999  . POLYPECTOMY       OB History   No obstetric history on file.      Home Medications    Prior to Admission medications   Medication Sig Start Date End Date Taking? Authorizing Provider  albuterol (PROVENTIL HFA;VENTOLIN HFA) 108 (90 Base) MCG/ACT inhaler Inhale 2 puffs into the lungs every 6 (six) hours as needed for wheezing or shortness of breath. 08/21/18  Yes Midge Minium, MD  ALPRAZolam Duanne Moron) 1 MG tablet Take 0.5-2 mg by mouth See admin instructions. Take 1/2 tablet every morning and take 2 tablets at bedtime 11/01/14  Yes [provider]  amLODipine (NORVASC) 5 MG tablet TAKE 1/2 TABLET BY MOUTH IN THE MORNING AND 1 TABLET BY MOUTH AT BEDTIME Patient taking differently: Take 2.5-5 mg by mouth See admin instructions. TAKE 1/2 TABLET BY MOUTH IN THE MORNING AND 1 TABLET BY MOUTH AT BEDTIME 03/02/18  Yes Skeet Latch, MD  butalbital-aspirin-caffeine-codeine (  FIORINAL WITH CODEINE) 50-325-40-30 MG capsule TAKE 1 CAPSULE BY MOUTH EVERY 8 HOURS AS NEEDED FOR PAIN Patient taking differently: Take 1 capsule by mouth every 8 (eight) hours as needed for headache.  08/26/18  Yes Midge Minium, MD  cetirizine (ZYRTEC) 10 MG tablet Take 10 mg by mouth daily.   Yes [provider]  fenofibrate 160 MG tablet TAKE 1 TABLET(160 MG) BY MOUTH DAILY Patient taking differently: Take 160 mg  by mouth daily.  06/23/18  Yes Midge Minium, MD  FLUoxetine (PROZAC) 20 MG capsule Take 60 mg by mouth daily.  05/06/18  Yes [provider]  fluticasone (FLONASE) 50 MCG/ACT nasal spray Place 1 spray into both nostrils daily.   Yes [provider]  lamoTRIgine (LAMICTAL) 100 MG tablet Take 100 mg by mouth daily.   Yes [provider]  lisinopril (PRINIVIL,ZESTRIL) 20 MG tablet TAKE 1 TABLET(20 MG) BY MOUTH TWICE DAILY Patient taking differently: Take 20 mg by mouth 2 (two) times daily.  06/17/18  Yes Midge Minium, MD  meclizine (ANTIVERT) 25 MG tablet Take 1 tablet (25 mg total) by mouth 3 (three) times daily as needed for dizziness. 08/26/18  Yes Midge Minium, MD  mycophenolate (CELLCEPT) 500 MG tablet Take 500 mg by mouth 2 (two) times daily.  07/23/17  Yes [provider]  oxyCODONE-acetaminophen (PERCOCET) 5-325 MG tablet Take 1 tablet by mouth every 4 (four) hours as needed for moderate pain. 02/63/78  Yes Delora Fuel, MD  pantoprazole (PROTONIX) 40 MG tablet Take 1 tablet (40 mg total) by mouth 2 (two) times daily. 08/12/18  Yes Midge Minium, MD  pravastatin (PRAVACHOL) 40 MG tablet Take 1 tablet (40 mg total) by mouth daily. 05/21/18  Yes Midge Minium, MD  predniSONE (DELTASONE) 10 MG tablet Take 2.5 mg by mouth daily with breakfast.  04/05/17  Yes [provider]  promethazine (PHENERGAN) 25 MG tablet Take 1 tablet (25 mg total) by mouth every 8 (eight) hours as needed for nausea or vomiting. 08/26/18  Yes Midge Minium, MD  temazepam (RESTORIL) 30 MG capsule Take 30 mg by mouth at bedtime.   Yes [provider]  OXYGEN Inhale 2 L into the lungs as needed (SOB).    [provider]    Family History Family History  Problem Relation Age of Onset  . Diabetes Mother   . Heart disease Mother   . Anxiety disorder Mother   . Depression Mother   . Kidney disease Mother   . Diabetes Father   .  Heart disease Father   . Anxiety disorder Father   . Depression Father   . Diabetes Paternal Grandmother   . Cystic fibrosis Son   . Alcohol abuse Son   . Breast cancer Maternal Aunt     Social History Social History   Tobacco Use  . Smoking status: Former Research scientist (life sciences)  . Smokeless tobacco: Never Used  . Tobacco comment: smoked 2 years in late teens about 8-10 cigs daily.   Substance Use Topics  . Alcohol use: No    Alcohol/week: 0.0 standard drinks    Comment: OCC  . Drug use: No     Allergies   Macrobid [nitrofurantoin monohyd macro] and Sulfa antibiotics   Review of Systems Review of Systems  Constitutional: Negative for chills and fever.  HENT: Negative for ear pain and sore throat.   Eyes: Negative for pain and visual disturbance.  Respiratory: Positive for shortness of breath.  Negative for cough.   Cardiovascular: Negative for chest pain and palpitations.  Gastrointestinal: Negative for abdominal pain and vomiting.  Genitourinary: Negative for dysuria and hematuria.  Musculoskeletal: Positive for arthralgias ( L ankle), gait problem, joint swelling ( L ankle) and myalgias ( L ankle and foot). Negative for back pain.  Skin: Negative for color change and rash.  Neurological: Negative for seizures and syncope.  All other systems reviewed and are negative.    Physical Exam Updated Vital Signs BP 128/61   Pulse 80   Resp 14   Ht 5\' 4"  (1.626 m)   Wt 108.9 kg   SpO2 96%   BMI 41.21 kg/m   Physical Exam Vitals signs and nursing note reviewed.  Constitutional:      General: She is not in acute distress.    Appearance: She is well-developed.  HENT:     Head: Normocephalic and atraumatic.     Mouth/Throat:     Mouth: Mucous membranes are moist.  Eyes:     Conjunctiva/sclera: Conjunctivae normal.     Pupils: Pupils are equal, round, and reactive to light.  Neck:     Musculoskeletal: Neck supple.  Cardiovascular:     Rate and Rhythm: Normal rate and regular  rhythm.     Pulses: Normal pulses.     Heart sounds: No murmur.  Pulmonary:     Effort: Pulmonary effort is normal. No respiratory distress.     Breath sounds: Normal breath sounds.  Abdominal:     Palpations: Abdomen is soft.     Tenderness: There is no abdominal tenderness.  Musculoskeletal:     Left ankle: She exhibits decreased range of motion, swelling and ecchymosis. She exhibits no deformity.  Skin:    General: Skin is warm and dry.  Neurological:     Mental Status: She is alert.      ED Treatments / Results  Labs (all labs ordered are listed, but only abnormal results are displayed) Labs Reviewed  CBC WITH DIFFERENTIAL/PLATELET - Abnormal; Notable for the following components:      Result Value   Hemoglobin 10.8 (*)    HCT 35.2 (*)    All other components within normal limits  COMPREHENSIVE METABOLIC PANEL - Abnormal; Notable for the following components:   Glucose, Bld 115 (*)    Total Protein 6.3 (*)    AST 12 (*)    All other components within normal limits  I-STAT CHEM 8, ED - Abnormal; Notable for the following components:   Glucose, Bld 109 (*)    Calcium, Ion 1.13 (*)    Hemoglobin 11.6 (*)    HCT 34.0 (*)    All other components within normal limits  RESPIRATORY PANEL BY PCR  BRAIN NATRIURETIC PEPTIDE  PROTIME-INR  APTT  HIV ANTIBODY (ROUTINE TESTING W REFLEX)  BASIC METABOLIC PANEL  CBC  I-STAT TROPONIN, ED    EKG EKG Interpretation  Date/Time:  Saturday September 12 2018 21:20:32 EST Ventricular Rate:  87 PR Interval:    QRS Duration: 99 QT Interval:  364 QTC Calculation: 438 R Axis:   -20 Text Interpretation:  Sinus rhythm Abnormal R-wave progression, late transition Left ventricular hypertrophy No significant change since last tracing Confirmed by Wandra Arthurs [phone]) on 09/12/2018 9:22:56 PM   Radiology Dg Ankle Complete Left  Result Date: 09/11/2018 CLINICAL DATA:  Status post 3 falls, with left ankle pain, swelling and bruising.  Obvious left ankle deformity. Initial encounter. EXAM: LEFT ANKLE COMPLETE - 3+  VIEW COMPARISON:  None. FINDINGS: There is a laterally displaced fracture of the distal fibula, with lateral dislocation of the talus. No definite medial malleolar or posterior malleolar fracture is seen. Scattered accessory ossicles are noted at the distal Achilles tendon. A plantar calcaneal spur is seen. The subtalar joint is grossly unremarkable. Diffuse soft tissue swelling is noted about the ankle. IMPRESSION: Laterally displaced fracture of the distal fibula, with lateral dislocation of the talus. Electronically Signed   By: Garald Balding M.D.   On: 09/11/2018 04:01   Ct Angio Chest Pe W And/or Wo Contrast  Result Date: 09/12/2018 CLINICAL DATA:  Shortness of breath.  History of prior DVTs. EXAM: CT ANGIOGRAPHY CHEST WITH CONTRAST TECHNIQUE: Multidetector CT imaging of the chest was performed using the standard protocol during bolus administration of intravenous contrast. Multiplanar CT image reconstructions and MIPs were obtained to evaluate the vascular anatomy. CONTRAST:  149mL ISOVUE-370 IOPAMIDOL (ISOVUE-370) INJECTION 76% COMPARISON:  June 10, 2018 FINDINGS: Cardiovascular: Satisfactory opacification of the pulmonary arteries to the segmental level. No evidence of pulmonary embolism. The heart size is mildly enlarged. No pericardial effusion. Mediastinum/Nodes: No enlarged mediastinal, hilar, or axillary lymph nodes. Thyroid gland, trachea, and esophagus demonstrate no significant findings. Lungs/Pleura: Patchy ground-glass opacities are identified in bilateral upper lobes slightly more prominent compared to prior CT. There is no pleural effusion. Mild dependent atelectasis of posterior lung bases are noted. Upper Abdomen: No acute abnormality. Musculoskeletal: Mild degenerative joint changes of the spine are noted. Review of the MIP images confirms the above findings. IMPRESSION: No pulmonary embolus. Mild patchy  ground-glass opacities are identified in bilateral upper lobes slightly more prominent compared to prior exam. This is nonspecific. Findings can be seen in some degree of pulmonary edema or infectious/inflammatory etiology. Electronically Signed   By: Abelardo Diesel M.D.   On: 09/12/2018 23:25   Dg Chest Port 1 View  Result Date: 09/12/2018 CLINICAL DATA:  Shortness of breath and chest pain starting yesterday. EXAM: PORTABLE CHEST 1 VIEW COMPARISON:  None. FINDINGS: The heart size and mediastinal contours are within normal limits. There is no focal infiltrate, pulmonary edema, or pleural effusion. The visualized skeletal structures are stable. IMPRESSION: No active cardiopulmonary disease. Electronically Signed   By: Abelardo Diesel M.D.   On: 09/12/2018 22:14   Dg Ankle Left Port  Result Date: 09/11/2018 CLINICAL DATA:  Status post reduction of left ankle fracture/dislocation. Initial encounter. EXAM: PORTABLE LEFT ANKLE - 2 VIEW COMPARISON:  Left ankle radiographs performed earlier today at 3:34 a.m. FINDINGS: There has been reduction of the talar dislocation. There is mild residual lateral displacement of the distal fibular fracture, and mild residual medial widening of the ankle mortise. Residual apparent mild misalignment of the anterior facet of the subtalar joint may reflect underlying ligamentous injury. No new fractures are seen. A plantar calcaneal spur is noted. The soft tissues are difficult to fully assess due to the overlying splint. IMPRESSION: 1. Successful reduction of talar dislocation. Mild residual lateral displacement of the distal fibular fracture, and mild residual medial widening of the ankle mortise. 2. Residual apparent mild misalignment of the anterior facet of the subtalar joint may reflect underlying ligamentous injury. Electronically Signed   By: Garald Balding M.D.   On: 09/11/2018 06:15   Dg Foot Complete Left  Result Date: 09/11/2018 CLINICAL DATA:  Status post fall, with  left ankle pain, swelling, bruising and deformity. Initial encounter. EXAM: LEFT FOOT - COMPLETE 3+ VIEW COMPARISON:  None. FINDINGS: There is  lateral displacement of a distal fibular fracture, and lateral anterior dislocation of the talus. Diffuse surrounding soft tissue swelling is noted. There appears to be misalignment of the calcaneus and cuboid at the anterior facet of the subtalar joint; this may reflect underlying ligamentous injury, but is difficult to fully characterize. Accessory ossicles are noted at the distal Achilles tendon. A small plantar calcaneal spur is seen. The subtalar joint is grossly unremarkable. IMPRESSION: 1. Lateral displacement of a distal fibular fracture, and lateral anterior dislocation of the talus. 2. Apparent misalignment of the calcaneus and cuboid at the anterior facet of the subtalar joint; this may reflect underlying ligamentous injury, but is difficult to fully characterize. Electronically Signed   By: Garald Balding M.D.   On: 09/11/2018 04:08    Procedures Procedures (including critical care time)  Medications Ordered in ED Medications  butalbital-aspirin-caffeine-codeine (FIORINAL WITH CODEINE) capsule 1 capsule (has no administration in time range)  oxyCODONE-acetaminophen (PERCOCET/ROXICET) 5-325 MG per tablet 1 tablet (has no administration in time range)  amLODipine (NORVASC) tablet 2.5-5 mg (has no administration in time range)  lisinopril (PRINIVIL,ZESTRIL) tablet 20 mg (has no administration in time range)  pravastatin (PRAVACHOL) tablet 40 mg (has no administration in time range)  ALPRAZolam (XANAX) tablet 0.5-2 mg (has no administration in time range)  FLUoxetine (PROZAC) capsule 60 mg (has no administration in time range)  temazepam (RESTORIL) capsule 30 mg (has no administration in time range)  meclizine (ANTIVERT) tablet 25 mg (has no administration in time range)  pantoprazole (PROTONIX) EC tablet 40 mg (has no administration in time range)    mycophenolate (CELLCEPT) tablet 500 mg (has no administration in time range)  lamoTRIgine (LAMICTAL) tablet 100 mg (has no administration in time range)  loratadine (CLARITIN) tablet 10 mg (has no administration in time range)  fluticasone (FLONASE) 50 MCG/ACT nasal spray 1 spray (has no administration in time range)  promethazine (PHENERGAN) tablet 25 mg (has no administration in time range)  hydrocortisone sodium succinate (SOLU-CORTEF) 100 MG injection 50 mg (has no administration in time range)  morphine 2 MG/ML injection 2 mg (has no administration in time range)  methocarbamol (ROBAXIN) tablet 500 mg (has no administration in time range)  albuterol (PROVENTIL) (2.5 MG/3ML) 0.083% nebulizer solution 2.5 mg (has no administration in time range)  dextromethorphan-guaiFENesin (MUCINEX DM) 30-600 MG per 12 hr tablet 1 tablet (has no administration in time range)  heparin injection 5,000 Units (has no administration in time range)  acetaminophen (TYLENOL) tablet 650 mg (has no administration in time range)    Or  acetaminophen (TYLENOL) suppository 650 mg (has no administration in time range)  senna-docusate (Senokot-S) tablet 1 tablet (has no administration in time range)  hydrALAZINE (APRESOLINE) injection 5 mg (has no administration in time range)  iopamidol (ISOVUE-370) 76 % injection 100 mL (100 mLs Intravenous Contrast Given 09/12/18 2249)  morphine 4 MG/ML injection 4 mg (4 mg Intravenous Given 09/13/18 0025)     Initial Impression / Assessment and Plan / ED Course  I have reviewed the triage vital signs and the nursing notes.  Pertinent labs & imaging results that were available during my care of the patient were reviewed by me and considered in my medical decision making (see chart for details).     Patient is a 62 year old female who presents with above-stated history exam.  On presentation patient is afebrile stable vital signs.  Exam as above remarkable for lungs clear to  auscultation bilaterally, intact extremity pulses, and left ankle  that has decreased range of motion and is tender and swollen.  Regarding the patient's acute shortness of breath have low suspicion for ACS given she denies any chest pain EKG shows left ventricular hypertrophy with otherwise normal intervals and no signs of acute ischemic change or changes from her last tracing.  In addition troponin is nonelevated.  Doubt PE given CTA chest does not show PE.  In addition I have a low suspicion for pneumonia given absence of focal consolidation on chest x-ray, absence of fever, and CBC with no lymphocytosis.  Doubt acute heart failure given patient has no pulmonary edema on her CTA chest and BNP is 30.  CMP WNL.  Patient given IV and p.o. analgesia in the ED.  Orthopedic service consulted as patient will likely require admission for IV pain medication.  Orthopedic surgery stated they had no acute recommendations that they would follow the patient as an inpatient to determine her out surgical plan.  Patient admitted in stable condition.  Final Clinical Impressions(s) / ED Diagnoses   Final diagnoses:  Acute respiratory distress    ED Discharge Orders    None       Hulan Saas, MD 09/13/18 0032    Drenda Freeze, MD 09/15/18 [phone]

## 2018-09-12 NOTE — ED Notes (Signed)
Pt returned from [place] here to replace the cast material the ed res removed initially  Pt is in muich pain with movement  Pulse present in the lt foot when she returned from c-t

## 2018-09-12 NOTE — ED Triage Notes (Signed)
The pt is here by [facility] for [facility] for 2 days  She was here 2 days ago for lt lower extremity pain  She fell 2 days ago  Pt has a splint on her lt lower extremity removed by the ed res  Hyperventilating on arrival

## 2018-09-12 NOTE — Progress Notes (Signed)
Orthopedic Tech Progress Note Patient Details:  Stacey Alvarez 04-Nov-1955 [id number]  Talked to nurse she said the PA requested a new stirrup splint to be put back on  Ortho Devices Type of Ortho Device: Stirrup splint, Post (short leg) splint Ortho Device/Splint Location: LLE Ortho Device/Splint Interventions: Application, Ordered   Post Interventions Patient Tolerated: Well Instructions Provided: Care of device   Janit Pagan 09/12/2018, 11:46 PM

## 2018-09-13 ENCOUNTER — Inpatient Hospital Stay (HOSPITAL_COMMUNITY): Payer: BLUE CROSS/BLUE SHIELD | Admitting: Anesthesiology

## 2018-09-13 ENCOUNTER — Encounter (HOSPITAL_COMMUNITY): Payer: Self-pay | Admitting: Surgery

## 2018-09-13 ENCOUNTER — Encounter (HOSPITAL_COMMUNITY)
Admission: EM | Disposition: A | Discharge status: Skilled Nursing Facility | Payer: Self-pay | Source: Home / Self Care | Attending: Family Medicine

## 2018-09-13 ENCOUNTER — Inpatient Hospital Stay (HOSPITAL_COMMUNITY): Payer: BLUE CROSS/BLUE SHIELD

## 2018-09-13 DIAGNOSIS — M797 Fibromyalgia: Secondary | ICD-10-CM | POA: Diagnosis present

## 2018-09-13 DIAGNOSIS — I10 Essential (primary) hypertension: Secondary | ICD-10-CM

## 2018-09-13 DIAGNOSIS — S9305XA Dislocation of left ankle joint, initial encounter: Secondary | ICD-10-CM | POA: Diagnosis not present

## 2018-09-13 DIAGNOSIS — Z90710 Acquired absence of both cervix and uterus: Secondary | ICD-10-CM | POA: Diagnosis not present

## 2018-09-13 DIAGNOSIS — Z87891 Personal history of nicotine dependence: Secondary | ICD-10-CM | POA: Diagnosis not present

## 2018-09-13 DIAGNOSIS — Z833 Family history of diabetes mellitus: Secondary | ICD-10-CM | POA: Diagnosis not present

## 2018-09-13 DIAGNOSIS — Z9049 Acquired absence of other specified parts of digestive tract: Secondary | ICD-10-CM | POA: Diagnosis not present

## 2018-09-13 DIAGNOSIS — S90852A Superficial foreign body, left foot, initial encounter: Secondary | ICD-10-CM | POA: Diagnosis not present

## 2018-09-13 DIAGNOSIS — Z86711 Personal history of pulmonary embolism: Secondary | ICD-10-CM | POA: Diagnosis not present

## 2018-09-13 DIAGNOSIS — S9306XA Dislocation of unspecified ankle joint, initial encounter: Secondary | ICD-10-CM | POA: Diagnosis present

## 2018-09-13 DIAGNOSIS — Z803 Family history of malignant neoplasm of breast: Secondary | ICD-10-CM | POA: Diagnosis not present

## 2018-09-13 DIAGNOSIS — K219 Gastro-esophageal reflux disease without esophagitis: Secondary | ICD-10-CM

## 2018-09-13 DIAGNOSIS — R0603 Acute respiratory distress: Secondary | ICD-10-CM | POA: Diagnosis present

## 2018-09-13 DIAGNOSIS — S82892K Other fracture of left lower leg, subsequent encounter for closed fracture with nonunion: Secondary | ICD-10-CM

## 2018-09-13 DIAGNOSIS — J849 Interstitial pulmonary disease, unspecified: Secondary | ICD-10-CM | POA: Diagnosis present

## 2018-09-13 DIAGNOSIS — R609 Edema, unspecified: Secondary | ICD-10-CM | POA: Diagnosis not present

## 2018-09-13 DIAGNOSIS — Z8249 Family history of ischemic heart disease and other diseases of the circulatory system: Secondary | ICD-10-CM | POA: Diagnosis not present

## 2018-09-13 DIAGNOSIS — Z882 Allergy status to sulfonamides status: Secondary | ICD-10-CM | POA: Diagnosis not present

## 2018-09-13 DIAGNOSIS — E785 Hyperlipidemia, unspecified: Secondary | ICD-10-CM | POA: Diagnosis not present

## 2018-09-13 DIAGNOSIS — F418 Other specified anxiety disorders: Secondary | ICD-10-CM | POA: Diagnosis not present

## 2018-09-13 DIAGNOSIS — W19XXXA Unspecified fall, initial encounter: Secondary | ICD-10-CM | POA: Diagnosis present

## 2018-09-13 DIAGNOSIS — I2510 Atherosclerotic heart disease of native coronary artery without angina pectoris: Secondary | ICD-10-CM | POA: Diagnosis present

## 2018-09-13 DIAGNOSIS — S82842A Displaced bimalleolar fracture of left lower leg, initial encounter for closed fracture: Secondary | ICD-10-CM | POA: Diagnosis not present

## 2018-09-13 DIAGNOSIS — Z818 Family history of other mental and behavioral disorders: Secondary | ICD-10-CM | POA: Diagnosis not present

## 2018-09-13 DIAGNOSIS — S82892A Other fracture of left lower leg, initial encounter for closed fracture: Secondary | ICD-10-CM | POA: Diagnosis present

## 2018-09-13 DIAGNOSIS — Z7951 Long term (current) use of inhaled steroids: Secondary | ICD-10-CM | POA: Diagnosis not present

## 2018-09-13 DIAGNOSIS — Z86718 Personal history of other venous thrombosis and embolism: Secondary | ICD-10-CM | POA: Diagnosis not present

## 2018-09-13 DIAGNOSIS — Z841 Family history of disorders of kidney and ureter: Secondary | ICD-10-CM | POA: Diagnosis not present

## 2018-09-13 DIAGNOSIS — Z881 Allergy status to other antibiotic agents status: Secondary | ICD-10-CM | POA: Diagnosis not present

## 2018-09-13 DIAGNOSIS — G4733 Obstructive sleep apnea (adult) (pediatric): Secondary | ICD-10-CM | POA: Diagnosis present

## 2018-09-13 DIAGNOSIS — Z7952 Long term (current) use of systemic steroids: Secondary | ICD-10-CM | POA: Diagnosis not present

## 2018-09-13 DIAGNOSIS — R42 Dizziness and giddiness: Secondary | ICD-10-CM

## 2018-09-13 DIAGNOSIS — Z6841 Body Mass Index (BMI) 40.0 and over, adult: Secondary | ICD-10-CM | POA: Diagnosis not present

## 2018-09-13 HISTORY — PX: ORIF ANKLE FRACTURE: SHX5408

## 2018-09-13 LAB — RESPIRATORY PANEL BY PCR
Adenovirus: NOT DETECTED
Bordetella pertussis: NOT DETECTED
Chlamydophila pneumoniae: NOT DETECTED
Coronavirus 229E: NOT DETECTED
Coronavirus HKU1: NOT DETECTED
Coronavirus NL63: NOT DETECTED
Coronavirus OC43: NOT DETECTED
Influenza A: NOT DETECTED
Influenza B: NOT DETECTED
METAPNEUMOVIRUS-RVPPCR: NOT DETECTED
Mycoplasma pneumoniae: NOT DETECTED
PARAINFLUENZA VIRUS 2-RVPPCR: NOT DETECTED
Parainfluenza Virus 1: NOT DETECTED
Parainfluenza Virus 3: NOT DETECTED
Parainfluenza Virus 4: NOT DETECTED
Respiratory Syncytial Virus: NOT DETECTED
Rhinovirus / Enterovirus: NOT DETECTED

## 2018-09-13 LAB — BASIC METABOLIC PANEL
ANION GAP: 10 (ref 5–15)
BUN: 11 mg/dL (ref 8–23)
CO2: 25 mmol/L (ref 22–32)
Calcium: 8.6 mg/dL — ABNORMAL LOW (ref 8.9–10.3)
Chloride: 103 mmol/L (ref 98–111)
Creatinine, Ser: 0.75 mg/dL (ref 0.44–1.00)
GFR calc Af Amer: >60 mL/min (ref >60)
GFR calc non Af Amer: >60 mL/min (ref >60)
Glucose, Bld: 114 mg/dL — ABNORMAL HIGH (ref 70–99)
Potassium: 3.6 mmol/L (ref 3.5–5.1)
Sodium: 138 mmol/L (ref 135–145)

## 2018-09-13 LAB — CBC
HCT: 31.6 % — ABNORMAL LOW (ref 36.0–46.0)
Hemoglobin: 9.9 g/dL — ABNORMAL LOW (ref 12.0–15.0)
MCH: 27.1 pg (ref 26.0–34.0)
MCHC: 31.3 g/dL (ref 30.0–36.0)
MCV: 86.6 fL (ref 80.0–100.0)
Platelets: 263 10*3/uL (ref 150–400)
RBC: 3.65 MIL/uL — ABNORMAL LOW (ref 3.87–5.11)
RDW: 13.9 % (ref 11.5–15.5)
WBC: 8.4 10*3/uL (ref 4.0–10.5)
nRBC: 0 % (ref 0.0–0.2)

## 2018-09-13 LAB — CK: Total CK: 33 U/L — ABNORMAL LOW (ref 38–234)

## 2018-09-13 SURGERY — OPEN REDUCTION INTERNAL FIXATION (ORIF) ANKLE FRACTURE
Anesthesia: General | Site: Leg Lower | Laterality: Left

## 2018-09-13 MED ORDER — AMLODIPINE BESYLATE 5 MG PO TABS
5.0000 mg | ORAL_TABLET | Freq: Every day | ORAL | Status: DC
  Administered 2018-09-13 – 2018-09-16 (×5): 5 mg via ORAL
  Filled 2018-09-13 (×5): qty 1

## 2018-09-13 MED ORDER — GLYCOPYRROLATE PF 0.2 MG/ML IJ SOSY
PREFILLED_SYRINGE | INTRAMUSCULAR | Status: DC | PRN
  Administered 2018-09-13: .2 mg via INTRAVENOUS

## 2018-09-13 MED ORDER — LACTATED RINGERS IV SOLN
INTRAVENOUS | Status: DC | PRN
  Administered 2018-09-13 (×2): via INTRAVENOUS

## 2018-09-13 MED ORDER — 0.9 % SODIUM CHLORIDE (POUR BTL) OPTIME
TOPICAL | Status: DC | PRN
  Administered 2018-09-13: 1000 mL

## 2018-09-13 MED ORDER — ONDANSETRON HCL 4 MG/2ML IJ SOLN
INTRAMUSCULAR | Status: DC | PRN
  Administered 2018-09-13: 4 mg via INTRAVENOUS

## 2018-09-13 MED ORDER — FENTANYL CITRATE (PF) 100 MCG/2ML IJ SOLN
INTRAMUSCULAR | Status: DC | PRN
  Administered 2018-09-13 (×3): 50 ug via INTRAVENOUS

## 2018-09-13 MED ORDER — FENTANYL CITRATE (PF) 250 MCG/5ML IJ SOLN
INTRAMUSCULAR | Status: AC
  Filled 2018-09-13: qty 5

## 2018-09-13 MED ORDER — LIDOCAINE 2% (20 MG/ML) 5 ML SYRINGE
INTRAMUSCULAR | Status: DC | PRN
  Administered 2018-09-13: 100 mg via INTRAVENOUS

## 2018-09-13 MED ORDER — FLUTICASONE PROPIONATE 50 MCG/ACT NA SUSP
1.0000 | Freq: Every day | NASAL | Status: DC
  Administered 2018-09-14 – 2018-09-17 (×4): 1 via NASAL
  Filled 2018-09-13: qty 16

## 2018-09-13 MED ORDER — ACETAMINOPHEN 325 MG PO TABS
650.0000 mg | ORAL_TABLET | Freq: Four times a day (QID) | ORAL | Status: DC | PRN
  Administered 2018-09-17: 650 mg via ORAL
  Filled 2018-09-13 (×2): qty 2

## 2018-09-13 MED ORDER — PANTOPRAZOLE SODIUM 40 MG PO TBEC
40.0000 mg | DELAYED_RELEASE_TABLET | Freq: Two times a day (BID) | ORAL | Status: DC
  Administered 2018-09-13 – 2018-09-17 (×9): 40 mg via ORAL
  Filled 2018-09-13 (×9): qty 1

## 2018-09-13 MED ORDER — DEXAMETHASONE SODIUM PHOSPHATE 10 MG/ML IJ SOLN
INTRAMUSCULAR | Status: DC | PRN
  Administered 2018-09-13: 8 mg via INTRAVENOUS

## 2018-09-13 MED ORDER — FENOFIBRATE 160 MG PO TABS
160.0000 mg | ORAL_TABLET | Freq: Every day | ORAL | Status: DC
  Administered 2018-09-14 – 2018-09-17 (×4): 160 mg via ORAL
  Filled 2018-09-13 (×4): qty 1

## 2018-09-13 MED ORDER — AMLODIPINE BESYLATE 2.5 MG PO TABS
2.5000 mg | ORAL_TABLET | Freq: Every day | ORAL | Status: DC
  Administered 2018-09-14 – 2018-09-17 (×5): 2.5 mg via ORAL
  Filled 2018-09-13 (×5): qty 1

## 2018-09-13 MED ORDER — ONDANSETRON HCL 4 MG PO TABS: 4.0000 mg | ORAL_TABLET | Freq: Four times a day (QID) | ORAL | Status: DC | PRN

## 2018-09-13 MED ORDER — SENNOSIDES-DOCUSATE SODIUM 8.6-50 MG PO TABS: 1.0000 | ORAL_TABLET | Freq: Every evening | ORAL | Status: DC | PRN

## 2018-09-13 MED ORDER — LAMOTRIGINE 100 MG PO TABS
100.0000 mg | ORAL_TABLET | Freq: Every day | ORAL | Status: DC
  Administered 2018-09-14 – 2018-09-17 (×4): 100 mg via ORAL
  Filled 2018-09-13 (×4): qty 1

## 2018-09-13 MED ORDER — CHLORHEXIDINE GLUCONATE 4 % EX LIQD: 60.0000 mL | Freq: Once | CUTANEOUS | Status: DC

## 2018-09-13 MED ORDER — DM-GUAIFENESIN ER 30-600 MG PO TB12
1.0000 | ORAL_TABLET | Freq: Two times a day (BID) | ORAL | Status: DC | PRN
  Filled 2018-09-13: qty 1

## 2018-09-13 MED ORDER — ALPRAZOLAM 0.5 MG PO TABS
1.0000 mg | ORAL_TABLET | Freq: Every day | ORAL | Status: DC
  Administered 2018-09-13 – 2018-09-14 (×3): 1 mg via ORAL
  Filled 2018-09-13: qty 4
  Filled 2018-09-13: qty 2
  Filled 2018-09-13: qty 4

## 2018-09-13 MED ORDER — VASOPRESSIN 20 UNIT/ML IV SOLN
INTRAVENOUS | Status: AC
  Filled 2018-09-13: qty 1

## 2018-09-13 MED ORDER — MYCOPHENOLATE MOFETIL 250 MG PO CAPS
500.0000 mg | ORAL_CAPSULE | Freq: Two times a day (BID) | ORAL | Status: DC
  Administered 2018-09-13 – 2018-09-17 (×9): 500 mg via ORAL
  Filled 2018-09-13 (×11): qty 2

## 2018-09-13 MED ORDER — METOCLOPRAMIDE HCL 5 MG PO TABS: 5.0000 mg | ORAL_TABLET | Freq: Three times a day (TID) | ORAL | Status: DC | PRN

## 2018-09-13 MED ORDER — LORAZEPAM 2 MG/ML IJ SOLN
INTRAMUSCULAR | Status: AC
  Administered 2018-09-13: 1 mg
  Filled 2018-09-13: qty 1

## 2018-09-13 MED ORDER — PROPOFOL 10 MG/ML IV BOLUS
INTRAVENOUS | Status: DC | PRN
  Administered 2018-09-13: 150 mg via INTRAVENOUS

## 2018-09-13 MED ORDER — MEPERIDINE HCL 50 MG/ML IJ SOLN: 6.2500 mg | INTRAMUSCULAR | Status: DC | PRN

## 2018-09-13 MED ORDER — HYDRALAZINE HCL 20 MG/ML IJ SOLN: 5.0000 mg | INTRAMUSCULAR | Status: DC | PRN

## 2018-09-13 MED ORDER — BUPIVACAINE HCL (PF) 0.5 % IJ SOLN
INTRAMUSCULAR | Status: DC | PRN
  Administered 2018-09-13: 20 mL
  Administered 2018-09-13: 30 mL

## 2018-09-13 MED ORDER — ACETAMINOPHEN 650 MG RE SUPP: 650.0000 mg | Freq: Four times a day (QID) | RECTAL | Status: DC | PRN

## 2018-09-13 MED ORDER — TEMAZEPAM 15 MG PO CAPS
30.0000 mg | ORAL_CAPSULE | Freq: Every day | ORAL | Status: DC
  Administered 2018-09-13 – 2018-09-16 (×5): 30 mg via ORAL
  Filled 2018-09-13 (×5): qty 2

## 2018-09-13 MED ORDER — MIDAZOLAM HCL 5 MG/5ML IJ SOLN
INTRAMUSCULAR | Status: DC | PRN
  Administered 2018-09-13 (×2): 1 mg via INTRAVENOUS

## 2018-09-13 MED ORDER — MIDAZOLAM HCL 2 MG/2ML IJ SOLN
INTRAMUSCULAR | Status: AC
  Filled 2018-09-13: qty 2

## 2018-09-13 MED ORDER — ALBUTEROL SULFATE (2.5 MG/3ML) 0.083% IN NEBU: 2.5000 mg | INHALATION_SOLUTION | RESPIRATORY_TRACT | Status: DC | PRN

## 2018-09-13 MED ORDER — ONDANSETRON HCL 4 MG/2ML IJ SOLN
4.0000 mg | Freq: Four times a day (QID) | INTRAMUSCULAR | Status: DC | PRN
  Administered 2018-09-16: 4 mg via INTRAVENOUS
  Filled 2018-09-13: qty 2

## 2018-09-13 MED ORDER — ALPRAZOLAM 0.5 MG PO TABS
0.5000 mg | ORAL_TABLET | Freq: Every day | ORAL | Status: DC
  Administered 2018-09-14 – 2018-09-15 (×2): 0.5 mg via ORAL
  Filled 2018-09-13 (×2): qty 1

## 2018-09-13 MED ORDER — METHOCARBAMOL 500 MG PO TABS
500.0000 mg | ORAL_TABLET | Freq: Three times a day (TID) | ORAL | Status: DC | PRN
  Administered 2018-09-13 – 2018-09-17 (×6): 500 mg via ORAL
  Filled 2018-09-13 (×6): qty 1

## 2018-09-13 MED ORDER — CEFAZOLIN SODIUM-DEXTROSE 2-4 GM/100ML-% IV SOLN
2.0000 g | Freq: Four times a day (QID) | INTRAVENOUS | Status: AC
  Administered 2018-09-13 – 2018-09-14 (×3): 2 g via INTRAVENOUS
  Filled 2018-09-13 (×3): qty 100

## 2018-09-13 MED ORDER — FLUOXETINE HCL 20 MG PO CAPS
60.0000 mg | ORAL_CAPSULE | Freq: Every day | ORAL | Status: DC
  Administered 2018-09-14 – 2018-09-17 (×4): 60 mg via ORAL
  Filled 2018-09-13 (×4): qty 3

## 2018-09-13 MED ORDER — LISINOPRIL 20 MG PO TABS
20.0000 mg | ORAL_TABLET | Freq: Two times a day (BID) | ORAL | Status: DC
  Administered 2018-09-13 – 2018-09-17 (×8): 20 mg via ORAL
  Filled 2018-09-13 (×8): qty 1

## 2018-09-13 MED ORDER — PHENYLEPHRINE HCL 10 MG/ML IJ SOLN
INTRAMUSCULAR | Status: DC | PRN
  Administered 2018-09-13 (×2): 100 ug via INTRAVENOUS

## 2018-09-13 MED ORDER — CLONIDINE HCL (ANALGESIA) 100 MCG/ML EP SOLN
EPIDURAL | Status: DC | PRN
  Administered 2018-09-13: 50 ug
  Administered 2018-09-13: 30 ug

## 2018-09-13 MED ORDER — PRAVASTATIN SODIUM 40 MG PO TABS
40.0000 mg | ORAL_TABLET | Freq: Every day | ORAL | Status: DC
  Administered 2018-09-13 – 2018-09-16 (×4): 40 mg via ORAL
  Filled 2018-09-13 (×4): qty 1

## 2018-09-13 MED ORDER — OXYCODONE-ACETAMINOPHEN 5-325 MG PO TABS
1.0000 | ORAL_TABLET | ORAL | Status: DC | PRN
  Administered 2018-09-13 – 2018-09-17 (×12): 1 via ORAL
  Filled 2018-09-13 (×13): qty 1

## 2018-09-13 MED ORDER — PROMETHAZINE HCL 25 MG/ML IJ SOLN: 6.2500 mg | INTRAMUSCULAR | Status: DC | PRN

## 2018-09-13 MED ORDER — DOCUSATE SODIUM 100 MG PO CAPS
100.0000 mg | ORAL_CAPSULE | Freq: Two times a day (BID) | ORAL | Status: DC
  Administered 2018-09-13 – 2018-09-15 (×3): 100 mg via ORAL
  Filled 2018-09-13 (×7): qty 1

## 2018-09-13 MED ORDER — HYDROMORPHONE HCL 1 MG/ML IJ SOLN: 0.2500 mg | INTRAMUSCULAR | Status: DC | PRN

## 2018-09-13 MED ORDER — PROMETHAZINE HCL 25 MG PO TABS: 25.0000 mg | ORAL_TABLET | Freq: Three times a day (TID) | ORAL | Status: DC | PRN

## 2018-09-13 MED ORDER — METOCLOPRAMIDE HCL 5 MG/ML IJ SOLN
5.0000 mg | Freq: Three times a day (TID) | INTRAMUSCULAR | Status: DC | PRN
  Administered 2018-09-16: 10 mg via INTRAVENOUS
  Filled 2018-09-13: qty 2

## 2018-09-13 MED ORDER — CEFAZOLIN SODIUM-DEXTROSE 2-4 GM/100ML-% IV SOLN
2.0000 g | INTRAVENOUS | Status: AC
  Administered 2018-09-13: 2 g via INTRAVENOUS

## 2018-09-13 MED ORDER — LORATADINE 10 MG PO TABS
10.0000 mg | ORAL_TABLET | Freq: Every day | ORAL | Status: DC
  Administered 2018-09-14 – 2018-09-17 (×4): 10 mg via ORAL
  Filled 2018-09-13 (×4): qty 1

## 2018-09-13 MED ORDER — MECLIZINE HCL 25 MG PO TABS
25.0000 mg | ORAL_TABLET | Freq: Three times a day (TID) | ORAL | Status: DC | PRN
  Filled 2018-09-13: qty 1

## 2018-09-13 MED ORDER — LORAZEPAM 2 MG/ML IJ SOLN: 1.0000 mg | Freq: Once | INTRAMUSCULAR | Status: DC

## 2018-09-13 MED ORDER — BUTALBITAL-APAP-CAFFEINE 50-325-40 MG PO TABS: 1.0000 | ORAL_TABLET | Freq: Three times a day (TID) | ORAL | Status: DC | PRN

## 2018-09-13 MED ORDER — HYDROCORTISONE NA SUCCINATE PF 100 MG IJ SOLR
50.0000 mg | Freq: Two times a day (BID) | INTRAMUSCULAR | Status: DC
  Administered 2018-09-13 – 2018-09-14 (×3): 50 mg via INTRAVENOUS
  Filled 2018-09-13 (×3): qty 1
  Filled 2018-09-13: qty 2

## 2018-09-13 MED ORDER — LACTATED RINGERS IV SOLN
INTRAVENOUS | Status: DC
  Administered 2018-09-13: 12:00:00 via INTRAVENOUS

## 2018-09-13 MED ORDER — HEPARIN SODIUM (PORCINE) 5000 UNIT/ML IJ SOLN
5000.0000 [IU] | Freq: Three times a day (TID) | INTRAMUSCULAR | Status: DC
  Administered 2018-09-13 – 2018-09-17 (×13): 5000 [IU] via SUBCUTANEOUS
  Filled 2018-09-13 (×13): qty 1

## 2018-09-13 MED ORDER — MORPHINE SULFATE (PF) 2 MG/ML IV SOLN
2.0000 mg | INTRAVENOUS | Status: DC | PRN
  Administered 2018-09-13 – 2018-09-14 (×2): 2 mg via INTRAVENOUS
  Filled 2018-09-13 (×2): qty 1

## 2018-09-13 MED ORDER — SODIUM CHLORIDE 0.9 % IV SOLN
INTRAVENOUS | Status: DC | PRN
  Administered 2018-09-13: 30 ug/min via INTRAVENOUS

## 2018-09-13 MED ORDER — CEFAZOLIN SODIUM-DEXTROSE 2-4 GM/100ML-% IV SOLN
INTRAVENOUS | Status: AC
  Filled 2018-09-13: qty 100

## 2018-09-13 MED ORDER — EPHEDRINE SULFATE 50 MG/ML IJ SOLN
INTRAMUSCULAR | Status: DC | PRN
  Administered 2018-09-13: 10 mg via INTRAVENOUS
  Administered 2018-09-13: 5 mg via INTRAVENOUS
  Administered 2018-09-13: 10 mg via INTRAVENOUS

## 2018-09-13 SURGICAL SUPPLY — 58 items
BANDAGE ESMARK 6X9 LF (GAUZE/BANDAGES/DRESSINGS) ×1 IMPLANT
BIT DRILL 2.5X110 QC LCP DISP (BIT) ×2 IMPLANT
BLADE SURG 15 STRL LF DISP TIS (BLADE) ×1 IMPLANT
BLADE SURG 15 STRL SS (BLADE) ×1
BNDG COHESIVE 4X5 TAN STRL (GAUZE/BANDAGES/DRESSINGS) IMPLANT
BNDG COHESIVE 6X5 TAN STRL LF (GAUZE/BANDAGES/DRESSINGS) IMPLANT
BNDG ELASTIC 6X10 VLCR STRL LF (GAUZE/BANDAGES/DRESSINGS) ×2 IMPLANT
BNDG ESMARK 6X9 LF (GAUZE/BANDAGES/DRESSINGS) ×2
CANISTER SUCT 3000ML PPV (MISCELLANEOUS) ×2 IMPLANT
CHLORAPREP W/TINT 26ML (MISCELLANEOUS) ×4 IMPLANT
COVER SURGICAL LIGHT HANDLE (MISCELLANEOUS) ×2 IMPLANT
COVER WAND RF STERILE (DRAPES) IMPLANT
CUFF TOURNIQUET SINGLE 34IN LL (TOURNIQUET CUFF) ×2 IMPLANT
CUFF TOURNIQUET SINGLE 44IN (TOURNIQUET CUFF) IMPLANT
DRAPE C-ARM 42X72 X-RAY (DRAPES) ×2 IMPLANT
DRAPE OEC MINIVIEW 54X84 (DRAPES) IMPLANT
DRAPE U-SHAPE 47X51 STRL (DRAPES) ×2 IMPLANT
DRSG MEPITEL 4X7.2 (GAUZE/BANDAGES/DRESSINGS) IMPLANT
DRSG PAD ABDOMINAL 8X10 ST (GAUZE/BANDAGES/DRESSINGS) IMPLANT
DRSG XEROFORM 1X8 (GAUZE/BANDAGES/DRESSINGS) ×2 IMPLANT
ELECT REM PT RETURN 9FT ADLT (ELECTROSURGICAL) ×2
ELECTRODE REM PT RTRN 9FT ADLT (ELECTROSURGICAL) ×1 IMPLANT
GAUZE SPONGE 4X4 12PLY STRL (GAUZE/BANDAGES/DRESSINGS) ×2 IMPLANT
GLOVE BIO SURGEON STRL SZ7.5 (GLOVE) ×2 IMPLANT
GLOVE BIOGEL PI IND STRL 8 (GLOVE) ×1 IMPLANT
GLOVE BIOGEL PI INDICATOR 8 (GLOVE) ×1
GLOVE ECLIPSE 8.0 STRL XLNG CF (GLOVE) IMPLANT
GOWN STRL REUS W/ TWL LRG LVL3 (GOWN DISPOSABLE) ×1 IMPLANT
GOWN STRL REUS W/ TWL XL LVL3 (GOWN DISPOSABLE) ×1 IMPLANT
GOWN STRL REUS W/TWL LRG LVL3 (GOWN DISPOSABLE) ×1
GOWN STRL REUS W/TWL XL LVL3 (GOWN DISPOSABLE) ×1
KIT BASIN OR (CUSTOM PROCEDURE TRAY) ×2 IMPLANT
KIT TURNOVER KIT B (KITS) ×2 IMPLANT
NS IRRIG 1000ML POUR BTL (IV SOLUTION) ×2 IMPLANT
PACK ORTHO EXTREMITY (CUSTOM PROCEDURE TRAY) ×2 IMPLANT
PAD ARMBOARD 7.5X6 YLW CONV (MISCELLANEOUS) ×4 IMPLANT
PAD CAST 4YDX4 CTTN HI CHSV (CAST SUPPLIES) ×1 IMPLANT
PADDING CAST COTTON 4X4 STRL (CAST SUPPLIES) ×1
PADDING CAST SYN 6 (CAST SUPPLIES) ×2
PADDING CAST SYNTHETIC 4 (CAST SUPPLIES) ×2
PADDING CAST SYNTHETIC 4X4 STR (CAST SUPPLIES) ×2 IMPLANT
PADDING CAST SYNTHETIC 6X4 NS (CAST SUPPLIES) ×2 IMPLANT
PLATE LCP 3.5 1/3 TUB 6HX69 (Plate) ×2 IMPLANT
SCREW CANC FT ST SFS 4X16 (Screw) ×4 IMPLANT
SCREW CORTEX 3.5 14MM (Screw) ×1 IMPLANT
SCREW CORTEX 3.5 45MM (Screw) ×4 IMPLANT
SCREW LOCK CORT ST 3.5X14 (Screw) ×1 IMPLANT
SPONGE LAP 18X18 X RAY DECT (DISPOSABLE) IMPLANT
SUCTION FRAZIER HANDLE 10FR (MISCELLANEOUS) ×1
SUCTION TUBE FRAZIER 10FR DISP (MISCELLANEOUS) ×1 IMPLANT
SUT ETHILON 3 0 PS 1 (SUTURE) ×4 IMPLANT
SUT MNCRL AB 3-0 PS2 18 (SUTURE) ×2 IMPLANT
SUT PDS AB 2-0 CT1 27 (SUTURE) ×2 IMPLANT
SUT VIC AB 2-0 CT1 27 (SUTURE)
SUT VIC AB 2-0 CT1 TAPERPNT 27 (SUTURE) IMPLANT
TOWEL OR 17X24 6PK STRL BLUE (TOWEL DISPOSABLE) ×2 IMPLANT
TOWEL OR 17X26 10 PK STRL BLUE (TOWEL DISPOSABLE) ×2 IMPLANT
TUBE CONNECTING 12X1/4 (SUCTIONS) ×2 IMPLANT

## 2018-09-13 NOTE — Progress Notes (Signed)
PROGRESS NOTE    Stacey Alvarez  SWF:[id number] DOB: [birth year] DOA: 09/12/2018 PCP: Midge Minium, MD    Brief Narrative:  62 year old female with a history of interstitial lung disease, GERD, hypertension, depression and anxiety, recently had a fall and suffered a left ankle fracture.  She was seen in the emergency room, was placed in a splint and was asked to follow-up with orthopedics.  Patient reports that she had worsening swelling and pain in her leg which led her to come back to the emergency room for evaluation.  She has been seen by orthopedics and plan is for operative management later today.   Assessment & Plan:   Principal Problem:   Closed left ankle fracture Active Problems:   HTN (hypertension)   GERD (gastroesophageal reflux disease)   Depression with anxiety   Hyperlipidemia   ILD (interstitial lung disease) (HCC)   Vertigo   1. Closed left ankle fracture.  Seen by orthopedics and plan is for operative management today.  Continue pain management.  Physical therapy.  Venous Dopplers of the left lower extremity been ordered to rule out DVT. 2. Hypertension.  Continue on amlodipine and lisinopril. 3. GERD.  Continue Protonix 4. Interstitial lung disease.  Continued on CellCept.  Holding prednisone and she has been started on stress dose Solu-Cortef.  Continue as needed nebulizers. 5. History of vertigo.  Continue meclizine as needed. 6. Hyperlipidemia.  Continue on fenofibrate. 7. Depression anxiety.  Continue on home dose of Xanax, Prozac and Lamictal.   DVT prophylaxis: heparin Code Status: full code Family Communication: no family present Disposition Plan: pending hospital course, may need placement depending on how she does after surgery   Consultants:   Orthopedics, Dr. Lucia Gaskins  Procedures:     Antimicrobials:      Subjective: Has pain in her left ankle. Denies any shortness of breath. Was previously very anxious, but is feeling better  now.  Objective: Vitals:   09/13/18 0400 09/13/18 0528 09/13/18 0600 09/13/18 0935  BP: (!) 121/58 121/73 (!) 145/75 115/64  Pulse: 77 74 79 77  Resp:  16 18 18   Temp:    (!) 97.2 F (36.2 C)  TempSrc:    Oral  SpO2: 94% 95% 95% 100%  Weight:      Height:       No intake or output data in the 24 hours ending 09/13/18 1028 Filed Weights   09/12/18 2120  Weight: 108.9 kg    Examination:  General exam: Appears calm and comfortable  Respiratory system: Clear to auscultation. Respiratory effort normal. Cardiovascular system: S1 & S2 heard, RRR. No JVD, murmurs, rubs, gallops or clicks. No pedal edema. Gastrointestinal system: Abdomen is nondistended, soft and nontender. No organomegaly or masses felt. Normal bowel sounds heard. Central nervous system: Alert and oriented. No focal neurological deficits. Extremities: left leg in splint Skin: No rashes, lesions or ulcers Psychiatry: Judgement and insight appear normal. Mood & affect appropriate.     Data Reviewed: I have personally reviewed following labs and imaging studies  CBC: Recent Labs  Lab 09/12/18 2130 09/12/18 2143 09/13/18 0255  WBC 9.9  --  8.4  NEUTROABS 7.1  --   --   HGB 10.8* 11.6* 9.9*  HCT 35.2* 34.0* 31.6*  MCV 85.9  --  86.6  PLT 286  --  220   Basic Metabolic Panel: Recent Labs  Lab 09/12/18 2130 09/12/18 2143 09/13/18 0255  NA 139 137 138  K 3.9 3.8 3.6  CL 103  103 103  CO2 25  --  25  GLUCOSE 115* 109* 114*  BUN 13 14 11   CREATININE 0.90 0.90 0.75  CALCIUM 9.3  --  8.6*   GFR: Estimated Creatinine Clearance: 85.7 mL/min (by C-G formula based on SCr of 0.75 mg/dL). Liver Function Tests: Recent Labs  Lab 09/12/18 2130  AST 12*  ALT 12  ALKPHOS 43  BILITOT 0.3  PROT 6.3*  ALBUMIN 3.5   No results for input(s): LIPASE, AMYLASE in the last 168 hours. No results for input(s): AMMONIA in the last 168 hours. Coagulation Profile: Recent Labs  Lab 09/12/18 2130  INR 1.07    Cardiac Enzymes: Recent Labs  Lab 09/13/18 0255  CKTOTAL 33*   BNP (last 3 results) No results for input(s): PROBNP in the last 8760 hours. HbA1C: No results for input(s): HGBA1C in the last 72 hours. CBG: No results for input(s): GLUCAP in the last 168 hours. Lipid Profile: No results for input(s): CHOL, HDL, LDLCALC, TRIG, CHOLHDL, LDLDIRECT in the last 72 hours. Thyroid Function Tests: No results for input(s): TSH, T4TOTAL, FREET4, T3FREE, THYROIDAB in the last 72 hours. Anemia Panel: No results for input(s): VITAMINB12, FOLATE, FERRITIN, TIBC, IRON, RETICCTPCT in the last 72 hours. Sepsis Labs: No results for input(s): PROCALCITON, LATICACIDVEN in the last 168 hours.  No results found for this or any previous visit (from the past 240 hour(s)).       Radiology Studies: Ct Angio Chest Pe W And/or Wo Contrast  Result Date: 09/12/2018 CLINICAL DATA:  Shortness of breath.  History of prior DVTs. EXAM: CT ANGIOGRAPHY CHEST WITH CONTRAST TECHNIQUE: Multidetector CT imaging of the chest was performed using the standard protocol during bolus administration of intravenous contrast. Multiplanar CT image reconstructions and MIPs were obtained to evaluate the vascular anatomy. CONTRAST:  178mL ISOVUE-370 IOPAMIDOL (ISOVUE-370) INJECTION 76% COMPARISON:  June 10, 2018 FINDINGS: Cardiovascular: Satisfactory opacification of the pulmonary arteries to the segmental level. No evidence of pulmonary embolism. The heart size is mildly enlarged. No pericardial effusion. Mediastinum/Nodes: No enlarged mediastinal, hilar, or axillary lymph nodes. Thyroid gland, trachea, and esophagus demonstrate no significant findings. Lungs/Pleura: Patchy ground-glass opacities are identified in bilateral upper lobes slightly more prominent compared to prior CT. There is no pleural effusion. Mild dependent atelectasis of posterior lung bases are noted. Upper Abdomen: No acute abnormality. Musculoskeletal: Mild  degenerative joint changes of the spine are noted. Review of the MIP images confirms the above findings. IMPRESSION: No pulmonary embolus. Mild patchy ground-glass opacities are identified in bilateral upper lobes slightly more prominent compared to prior exam. This is nonspecific. Findings can be seen in some degree of pulmonary edema or infectious/inflammatory etiology. Electronically Signed   By: Abelardo Diesel M.D.   On: 09/12/2018 23:25   Dg Chest Port 1 View  Result Date: 09/12/2018 CLINICAL DATA:  Shortness of breath and chest pain starting yesterday. EXAM: PORTABLE CHEST 1 VIEW COMPARISON:  None. FINDINGS: The heart size and mediastinal contours are within normal limits. There is no focal infiltrate, pulmonary edema, or pleural effusion. The visualized skeletal structures are stable. IMPRESSION: No active cardiopulmonary disease. Electronically Signed   By: Abelardo Diesel M.D.   On: 09/12/2018 22:14        Scheduled Meds: . ALPRAZolam  0.5 mg Oral Daily  . ALPRAZolam  1 mg Oral QHS  . amLODipine  2.5 mg Oral Daily  . amLODipine  5 mg Oral QHS  . fenofibrate  160 mg Oral Daily  . FLUoxetine  60 mg Oral Daily  . fluticasone  1 spray Each Nare Daily  . heparin  5,000 Units Subcutaneous Q8H  . hydrocortisone sod succinate (SOLU-CORTEF) inj  50 mg Intravenous Q12H  . lamoTRIgine  100 mg Oral Daily  . lisinopril  20 mg Oral BID  . loratadine  10 mg Oral Daily  . LORazepam  1 mg Intravenous Once  . mycophenolate  500 mg Oral BID  . pantoprazole  40 mg Oral BID  . pravastatin  40 mg Oral q1800  . temazepam  30 mg Oral QHS   Continuous Infusions:   LOS: 0 days    Time spent: 43mins    Kathie Dike, MD [facility] Pager [phone]  If 7PM-7AM, please contact night-coverage [email or web address] Password [facility] 09/13/2018, 10:28 AM

## 2018-09-13 NOTE — Consult Note (Signed)
Reason for Consult: Left bimalleolar equivalent ankle fracture dislocation Referring Physician: [facility]  Stacey Alvarez is an 62 y.o. female.  HPI: Patient had a fall 4 days ago and sustained a bimalleolar equivalent ankle fracture dislocation.  She was initially seen in the emergency department where reduction was done and she was splinted.  She was sent home.  Since then she has had difficulty mobilizing and increase shortness of breath.  She does have a history of interstitial lung disease.  She presented to the emergency department and was admitted to the medical service for pain control and work-up of her increased shortness of breath and dizziness.  She had negative CTAs and has been doing better.  The splint was removed and replaced and she is more comfortable.  She is concerned about disposition following surgical fixation of her ankle.  Past Medical History:  Diagnosis Date  . Allergy   . Anxiety   . Arthritis    KNEES,BACK,HIPS  . Depression   . Fibromyalgia   . GERD (gastroesophageal reflux disease)   . Hyperlipidemia   . Hypertension   . IBS (irritable bowel syndrome)   . Interstitial lung disease ([place])   . Sleep apnea     Past Surgical History:  Procedure Laterality Date  . CHOLECYSTECTOMY    . COLONOSCOPY    . PARTIAL HYSTERECTOMY  1999  . POLYPECTOMY      Family History  Problem Relation Age of Onset  . Diabetes Mother   . Heart disease Mother   . Anxiety disorder Mother   . Depression Mother   . Kidney disease Mother   . Diabetes Father   . Heart disease Father   . Anxiety disorder Father   . Depression Father   . Diabetes Paternal Grandmother   . Cystic fibrosis Son   . Alcohol abuse Son   . Breast cancer Maternal Aunt     Social History:  reports that she has quit smoking. She has never used smokeless tobacco. She reports that she does not drink alcohol or use drugs.  Allergies:  Allergies  Allergen Reactions  . Macrobid  [Nitrofurantoin Monohyd Macro] Diarrhea    Diarrhea, GI upset  . Sulfa Antibiotics Rash    Medications: I have reviewed the patient's current medications.  Results for orders placed or performed during the hospital encounter of 09/12/18 (from the past 48 hour(s))  CBC with Differential     Status: Abnormal   Collection Time: 09/12/18  9:30 PM  Result Value Ref Range   WBC 9.9 4.0 - 10.5 K/uL   RBC 4.10 3.87 - 5.11 MIL/uL   Hemoglobin 10.8 (L) 12.0 - 15.0 g/dL   HCT 35.2 (L) 36.0 - 46.0 %   MCV 85.9 80.0 - 100.0 fL   MCH 26.3 26.0 - 34.0 pg   MCHC 30.7 30.0 - 36.0 g/dL   RDW 14.0 11.5 - 15.5 %   Platelets 286 150 - 400 K/uL   nRBC 0.0 0.0 - 0.2 %   Neutrophils Relative % 73 %   Neutro Abs 7.1 1.7 - 7.7 K/uL   Lymphocytes Relative 19 %   Lymphs Abs 1.9 0.7 - 4.0 K/uL   Monocytes Relative 6 %   Monocytes Absolute 0.6 0.1 - 1.0 K/uL   Eosinophils Relative 2 %   Eosinophils Absolute 0.2 0.0 - 0.5 K/uL   Basophils Relative 0 %   Basophils Absolute 0.0 0.0 - 0.1 K/uL   Immature Granulocytes 0 %  Abs Immature Granulocytes 0.03 0.00 - 0.07 K/uL    Comment: Performed at [facility], [place] [address], [place], [place] [address]  Brain natriuretic peptide     Status: None   Collection Time: 09/12/18  9:30 PM  Result Value Ref Range   B Natriuretic Peptide 30.0 0.0 - 100.0 pg/mL    Comment: Performed at [facility] [address] [place], [place] [address]  Protime-INR     Status: None   Collection Time: 09/12/18  9:30 PM  Result Value Ref Range   Prothrombin Time 13.8 11.4 - 15.2 seconds   INR 1.07     Comment: Performed at [facility] [address], [place], [place] [address]  APTT     Status: None   Collection Time: 09/12/18  9:30 PM  Result Value Ref Range   aPTT 33 24 - 36 seconds    Comment: Performed at [facility] [address], [place], [place] [address]  Comprehensive metabolic panel     Status: Abnormal   Collection Time: 09/12/18   9:30 PM  Result Value Ref Range   Sodium 139 135 - 145 mmol/L   Potassium 3.9 3.5 - 5.1 mmol/L   Chloride 103 98 - 111 mmol/L   CO2 25 22 - 32 mmol/L   Glucose, Bld 115 (H) 70 - 99 mg/dL   BUN 13 8 - 23 mg/dL   Creatinine, Ser 0.90 0.44 - 1.00 mg/dL   Calcium 9.3 8.9 - 10.3 mg/dL   Total Protein 6.3 (L) 6.5 - 8.1 g/dL   Albumin 3.5 3.5 - 5.0 g/dL   AST 12 (L) 15 - 41 U/L   ALT 12 0 - 44 U/L   Alkaline Phosphatase 43 38 - 126 U/L   Total Bilirubin 0.3 0.3 - 1.2 mg/dL   GFR calc non Af Amer >60 >60 mL/min   GFR calc Af Amer >60 >60 mL/min   Anion gap 11 5 - 15    Comment: Performed at [facility] [address], [place], [place] [address]  I-stat troponin, ED     Status: None   Collection Time: 09/12/18  9:41 PM  Result Value Ref Range   Troponin i, poc 0.01 0.00 - 0.08 ng/mL   Comment 3            Comment: Due to the release kinetics of cTnI, a negative result within the first hours of the onset of symptoms does not rule out myocardial infarction with certainty. If myocardial infarction is still suspected, repeat the test at appropriate intervals.   I-stat chem 8, ed     Status: Abnormal   Collection Time: 09/12/18  9:43 PM  Result Value Ref Range   Sodium 137 135 - 145 mmol/L   Potassium 3.8 3.5 - 5.1 mmol/L   Chloride 103 98 - 111 mmol/L   BUN 14 8 - 23 mg/dL   Creatinine, Ser 0.90 0.44 - 1.00 mg/dL   Glucose, Bld 109 (H) 70 - 99 mg/dL   Calcium, Ion 1.13 (L) 1.15 - 1.40 mmol/L   TCO2 24 22 - 32 mmol/L   Hemoglobin 11.6 (L) 12.0 - 15.0 g/dL   HCT 34.0 (L) 36.0 - 48.1 %  Basic metabolic panel     Status: Abnormal   Collection Time: 09/13/18  2:55 AM  Result Value Ref Range   Sodium 138 135 - 145 mmol/L   Potassium 3.6 3.5 - 5.1 mmol/L  Chloride 103 98 - 111 mmol/L   CO2 25 22 - 32 mmol/L   Glucose, Bld 114 (H) 70 - 99 mg/dL   BUN 11 8 - 23 mg/dL   Creatinine, Ser 0.75 0.44 - 1.00 mg/dL   Calcium 8.6 (L) 8.9 - 10.3 mg/dL   GFR calc non Af Amer >60 >60  mL/min   GFR calc Af Amer >60 >60 mL/min   Anion gap 10 5 - 15    Comment: Performed at [facility] [address], [place], [place] [address]  CBC     Status: Abnormal   Collection Time: 09/13/18  2:55 AM  Result Value Ref Range   WBC 8.4 4.0 - 10.5 K/uL   RBC 3.65 (L) 3.87 - 5.11 MIL/uL   Hemoglobin 9.9 (L) 12.0 - 15.0 g/dL   HCT 31.6 (L) 36.0 - 46.0 %   MCV 86.6 80.0 - 100.0 fL   MCH 27.1 26.0 - 34.0 pg   MCHC 31.3 30.0 - 36.0 g/dL   RDW 13.9 11.5 - 15.5 %   Platelets 263 150 - 400 K/uL   nRBC 0.0 0.0 - 0.2 %    Comment: Performed at [facility], [place] [address] [place], [place] [address]  CK     Status: Abnormal   Collection Time: 09/13/18  2:55 AM  Result Value Ref Range   Total CK 33 (L) 38 - 234 U/L    Comment: Performed at [facility], [place] [address], [place], [place] [address]    Ct Angio Chest Pe W And/or Wo Contrast  Result Date: 09/12/2018 CLINICAL DATA:  Shortness of breath.  History of prior DVTs. EXAM: CT ANGIOGRAPHY CHEST WITH CONTRAST TECHNIQUE: Multidetector CT imaging of the chest was performed using the standard protocol during bolus administration of intravenous contrast. Multiplanar CT image reconstructions and MIPs were obtained to evaluate the vascular anatomy. CONTRAST:  156mL ISOVUE-370 IOPAMIDOL (ISOVUE-370) INJECTION 76% COMPARISON:  June 10, 2018 FINDINGS: Cardiovascular: Satisfactory opacification of the pulmonary arteries to the segmental level. No evidence of pulmonary embolism. The heart size is mildly enlarged. No pericardial effusion. Mediastinum/Nodes: No enlarged mediastinal, hilar, or axillary lymph nodes. Thyroid gland, trachea, and esophagus demonstrate no significant findings. Lungs/Pleura: Patchy ground-glass opacities are identified in bilateral upper lobes slightly more prominent compared to prior CT. There is no pleural effusion. Mild dependent atelectasis of posterior lung bases are noted. Upper Abdomen: No acute  abnormality. Musculoskeletal: Mild degenerative joint changes of the spine are noted. Review of the MIP images confirms the above findings. IMPRESSION: No pulmonary embolus. Mild patchy ground-glass opacities are identified in bilateral upper lobes slightly more prominent compared to prior exam. This is nonspecific. Findings can be seen in some degree of pulmonary edema or infectious/inflammatory etiology. Electronically Signed   By: Abelardo Diesel M.D.   On: 09/12/2018 23:25   Dg Chest Port 1 View  Result Date: 09/12/2018 CLINICAL DATA:  Shortness of breath and chest pain starting yesterday. EXAM: PORTABLE CHEST 1 VIEW COMPARISON:  None. FINDINGS: The heart size and mediastinal contours are within normal limits. There is no focal infiltrate, pulmonary edema, or pleural effusion. The visualized skeletal structures are stable. IMPRESSION: No active cardiopulmonary disease. Electronically Signed   By: Abelardo Diesel M.D.   On: 09/12/2018 22:14    Review of Systems  Constitutional: Negative.   HENT: Negative.   Eyes: Negative.   Respiratory: Positive for shortness of breath.   Cardiovascular: Negative.   Gastrointestinal:  Negative.   Musculoskeletal:       Left ankle pain  Skin: Negative.   Neurological: Negative.   Psychiatric/Behavioral: Negative.    Blood pressure (!) 145/75, pulse 79, resp. rate 18, height 5\' 4"  (1.626 m), weight 108.9 kg, SpO2 95 %. Physical Exam  Constitutional: She appears well-developed.  HENT:  Head: Normocephalic.  Eyes: Conjunctivae are normal.  Neck: Neck supple.  Cardiovascular: Normal rate.  Respiratory: Effort normal.  Nasal cannula in place  GI: Soft.  Musculoskeletal:     Comments: Left lower extremity with splint in place.  Toes exposed are warm and well-perfused.  She is able to wiggle toes.  Endorses sensation to light touch over the toes.  No tenderness palpation proximally about the knee or thigh.  No evidence of right lower extremity or bilateral  upper extremity injuries.  Skin: Skin is warm.  Psychiatric: She has a normal mood and affect.    Assessment/Plan: Chanah has a displaced left bimalleolar equivalent ankle fracture dislocation.  She also has interstitial lung disease and other medical comorbidities.  She has a history of blood clots and PEs.  She does not currently take an anticoagulant for these.  Given this we discussed the increased surgical risk for her ankle fracture.  She certainly has an unstable ankle and would benefit from surgical intervention.  She is currently n.p.o. and we will plan to do the surgery today.  She understands that postoperatively she will need to remain nonweightbearing for a minimum of 6 weeks.  The risks, benefits and alternatives to the surgery were discussed with the patient.  The risks discussed included but were not limited to wound healing complications, infection, nonunion, malunion, need for second surgery, damage to surrounding structures.  After weighing these risks she opted to proceed with surgery.  We also briefly discussed the perioperative and anesthetic risks which include death.   Erle Crocker 27-Sep-2018, 8:35 AM

## 2018-09-13 NOTE — Transfer of Care (Signed)
Immediate Anesthesia Transfer of Care Note  Patient: Stacey Alvarez  Procedure(s) Performed: OPEN REDUCTION INTERNAL FIXATION (ORIF) ANKLE FRACTURE (Left Leg Lower)  Patient Location: PACU  Anesthesia Type:General  Level of Consciousness: awake, alert , oriented and patient cooperative  Airway & Oxygen Therapy: Patient Spontanous Breathing and Patient connected to nasal cannula oxygen  Post-op Assessment: Report given to RN and Post -op Vital signs reviewed and stable  Post vital signs: Reviewed and stable  Last Vitals:  Vitals Value Taken Time  BP 143/70 09/13/2018  2:34 PM  Temp 36.5 C 09/13/2018  2:34 PM  Pulse 87 09/13/2018  2:38 PM  Resp 13 09/13/2018  2:38 PM  SpO2 100 % 09/13/2018  2:38 PM  Vitals shown include unvalidated device data.  Last Pain:  Vitals:   09/13/18 1434  TempSrc:   PainSc: Asleep         Complications: No apparent anesthesia complications

## 2018-09-13 NOTE — Brief Op Note (Signed)
09/13/2018  2:30 PM  PATIENT:  Stacey Alvarez  62 y.o. female  PRE-OPERATIVE DIAGNOSIS:  left ankle fracture  POST-OPERATIVE DIAGNOSIS:  left ankle fracture, syndesmotic disruption  PROCEDURE:  Procedure(s): OPEN REDUCTION INTERNAL FIXATION (ORIF) ANKLE FRACTURE (Left)  Open treatment syndesmosis Ankle arthrotomy and loose body removal  SURGEON:  Surgeon(s) and Role:    * Erle Crocker, MD - Primary  PHYSICIAN ASSISTANT:   ASSISTANTS: none   ANESTHESIA:   general, popliteal block  EBL:  10 mL   BLOOD ADMINISTERED:none  DRAINS: none   LOCAL MEDICATIONS USED:  NONE  SPECIMEN:  No Specimen  DISPOSITION OF SPECIMEN:  PATHOLOGY  COUNTS:  YES  TOURNIQUET:   Total Tourniquet Time Documented: Thigh (Left) - 45 minutes Total: Thigh (Left) - 45 minutes   DICTATION: .Viviann Spare Dictation  PLAN OF CARE: Admit to inpatient   PATIENT DISPOSITION:  PACU - hemodynamically stable.   Delay start of Pharmacological VTE agent (>24hrs) due to surgical blood loss or risk of bleeding: no

## 2018-09-13 NOTE — ED Notes (Signed)
Pt c/o lt ankle pain after the splints were replaced ortho tech called to replace the bandage

## 2018-09-13 NOTE — Anesthesia Procedure Notes (Signed)
Anesthesia Regional Block: Adductor canal block   Pre-Anesthetic Checklist: ,, timeout performed, Correct Patient, Correct Site, Correct Laterality, Correct Procedure, Correct Position, site marked, Risks and benefits discussed,  Surgical consent,  Pre-op evaluation,  At surgeon's request and post-op pain management  Laterality: Left  Prep: chloraprep       Needles:  Injection technique: Single-shot  Needle Type: Stimiplex     Needle Length: 9cm  Needle Gauge: 21     Additional Needles:   Procedures:,,,, ultrasound used (permanent image in chart),,,,  Narrative:  Start time: 09/13/2018 11:40 AM End time: 09/13/2018 11:43 AM Injection made incrementally with aspirations every 5 mL.  Performed by: Personally  Anesthesiologist: Nolon Nations, MD  Additional Notes: BP cuff, EKG monitors applied. Sedation begun. Artery and nerve location verified with U/S and anesthetic injected incrementally, slowly, and after negative aspirations under direct u/s guidance. Good fascial /perineural spread. Tolerated well.

## 2018-09-13 NOTE — H&P (Signed)
History and Physical    Stacey Alvarez ZHY:[id number] DOB: May 14, 1956 DOA: 09/12/2018  Referring MD/NP/PA:   PCP: Midge Minium, MD   Patient coming from:  The patient is coming from home.  At baseline, pt is independent for most of ADL.        Chief Complaint: Left foot and leg pain  HPI: Stacey Alvarez is a 62 y.o. female with medical history significant of hypertension, hyperlipidemia, interstitial lung disease on 2 L nasal cannula oxygen as needed, GERD, depression with anxiety, fibromyalgia, OSA on CPAP, obesity, vertigo, who presents with left foot and left lower leg pain.  Patient states that because of vertigo, she fell 2 days ago, injured her left foot and ankle.  She was seen in the ED, and found to have laterally displaced fracture of the distal fibula, with lateral dislocation of the talus. Fracture dislocation was reduced and splint applied. She is referred to orthopedics for elective open reduction and internal fixation. Per EDP's note, "she is referred to orthopedics for elective open reduction and internal fixation. Discussed the case with Dr. Berenice Primas, who is her personal orthopedic physician.  He requests patient be seen by his associate Dr. Lucia Gaskins, who is a foot and ankle specialist.  They will make arrangements for prompt follow-up and elective surgical management."  Today patient states that she has worsening pain in the left foot and lower leg.  She also has worsening edema in the left lower leg.  The pain is constant, 10 out of 10 severity, sharp, nonradiating.  Patient states that the pain is intolerable.  She cannot put any weight on the leg.  She states that when she has worsening pain, her shortness of breath is also worse than the baseline.  She has dry cough, but no fever or chills.  She had chest discomfort earlier, which has resolved.  Currently patient does not have any chest pain.  Patient has nausea, no vomiting, diarrhea or abdominal pain pain no symptoms of UTI or  unilateral weakness.  ED Course: pt was found to have WBC 9.9, INR 1.07, PTT 33, negative troponin, BNP 30, electrolytes renal function okay, no fever, heart rate in 90s, oxygen saturation 95% on room air.  Chest x-ray negative.  CT Ang of chest was negative for PE, but showed mild patchy groundglass in bilateral upper lobe.  Patient is admitted to [facility] bed as inpatient.  Orthopedic surgeon, Dr. Lucia Gaskins was consulted.  Review of Systems:   General: no fevers, chills, no body weight gain, has fatigue HEENT: no blurry vision, hearing changes or sore throat Respiratory: no dyspnea, coughing, wheezing CV: no chest pain, no palpitations GI: has nausea, no vomiting, abdominal pain, diarrhea, constipation GU: no dysuria, burning on urination, increased urinary frequency, hematuria  Ext: has left leg edema Neuro: no unilateral weakness, numbness, or tingling, no vision change or hearing loss Skin: no rash, no skin tear. MSK: has left foot, ankle and lower leg pain. Heme: No easy bruising.  Travel history: No recent long distant travel.  Allergy:  Allergies  Allergen Reactions  . Macrobid [Nitrofurantoin Monohyd Macro] Diarrhea    Diarrhea, GI upset  . Sulfa Antibiotics Rash    Past Medical History:  Diagnosis Date  . Allergy   . Anxiety   . Arthritis    KNEES,BACK,HIPS  . Depression   . Fibromyalgia   . GERD (gastroesophageal reflux disease)   . Hyperlipidemia   . Hypertension   . IBS (irritable bowel syndrome)   .  Interstitial lung disease ([place])   . Sleep apnea     Past Surgical History:  Procedure Laterality Date  . CHOLECYSTECTOMY    . COLONOSCOPY    . PARTIAL HYSTERECTOMY  1999  . POLYPECTOMY      Social History:  reports that she has quit smoking. She has never used smokeless tobacco. She reports that she does not drink alcohol or use drugs.  Family History:  Family History  Problem Relation Age of Onset  . Diabetes Mother   . Heart disease Mother   . Anxiety  disorder Mother   . Depression Mother   . Kidney disease Mother   . Diabetes Father   . Heart disease Father   . Anxiety disorder Father   . Depression Father   . Diabetes Paternal Grandmother   . Cystic fibrosis Son   . Alcohol abuse Son   . Breast cancer Maternal Aunt      Prior to Admission medications   Medication Sig Start Date End Date Taking? Authorizing Provider  albuterol (PROVENTIL HFA;VENTOLIN HFA) 108 (90 Base) MCG/ACT inhaler Inhale 2 puffs into the lungs every 6 (six) hours as needed for wheezing or shortness of breath. 08/21/18  Yes Midge Minium, MD  ALPRAZolam Duanne Moron) 1 MG tablet Take 0.5-2 mg by mouth See admin instructions. Take 1/2 tablet every morning and take 2 tablets at bedtime 11/01/14  Yes [provider]  amLODipine (NORVASC) 5 MG tablet TAKE 1/2 TABLET BY MOUTH IN THE MORNING AND 1 TABLET BY MOUTH AT BEDTIME Patient taking differently: Take 2.5-5 mg by mouth See admin instructions. TAKE 1/2 TABLET BY MOUTH IN THE MORNING AND 1 TABLET BY MOUTH AT BEDTIME 03/02/18  Yes Skeet Latch, MD  butalbital-aspirin-caffeine-codeine [facility] WITH CODEINE) [phone] MG capsule TAKE 1 CAPSULE BY MOUTH EVERY 8 HOURS AS NEEDED FOR PAIN Patient taking differently: Take 1 capsule by mouth every 8 (eight) hours as needed for headache.  08/26/18  Yes Midge Minium, MD  cetirizine (ZYRTEC) 10 MG tablet Take 10 mg by mouth daily.   Yes [provider]  fenofibrate 160 MG tablet TAKE 1 TABLET(160 MG) BY MOUTH DAILY Patient taking differently: Take 160 mg by mouth daily.  06/23/18  Yes Midge Minium, MD  FLUoxetine (PROZAC) 20 MG capsule Take 60 mg by mouth daily.  05/06/18  Yes [provider]  fluticasone (FLONASE) 50 MCG/ACT nasal spray Place 1 spray into both nostrils daily.   Yes [provider]  lamoTRIgine (LAMICTAL) 100 MG tablet Take 100 mg by mouth daily.   Yes [provider]  lisinopril (PRINIVIL,ZESTRIL) 20  MG tablet TAKE 1 TABLET(20 MG) BY MOUTH TWICE DAILY Patient taking differently: Take 20 mg by mouth 2 (two) times daily.  06/17/18  Yes Midge Minium, MD  meclizine (ANTIVERT) 25 MG tablet Take 1 tablet (25 mg total) by mouth 3 (three) times daily as needed for dizziness. 08/26/18  Yes Midge Minium, MD  mycophenolate (CELLCEPT) 500 MG tablet Take 500 mg by mouth 2 (two) times daily.  07/23/17  Yes [provider]  oxyCODONE-acetaminophen (PERCOCET) 5-325 MG tablet Take 1 tablet by mouth every 4 (four) hours as needed for moderate pain. 12/87/86  Yes Delora Fuel, MD  pantoprazole (PROTONIX) 40 MG tablet Take 1 tablet (40 mg total) by mouth 2 (two) times daily. 08/12/18  Yes Midge Minium, MD  pravastatin (PRAVACHOL) 40 MG tablet Take 1 tablet (40 mg total) by mouth daily. 05/21/18  Yes Tabori,  Aundra Millet, MD  predniSONE (DELTASONE) 10 MG tablet Take 2.5 mg by mouth daily with breakfast.  04/05/17  Yes [provider]  promethazine (PHENERGAN) 25 MG tablet Take 1 tablet (25 mg total) by mouth every 8 (eight) hours as needed for nausea or vomiting. 08/26/18  Yes Midge Minium, MD  temazepam (RESTORIL) 30 MG capsule Take 30 mg by mouth at bedtime.   Yes [provider]  OXYGEN Inhale 2 L into the lungs as needed (SOB).    [provider]    Physical Exam: Vitals:   09/13/18 0000 09/13/18 0130 09/13/18 0300 09/13/18 0305  BP: 128/61 93/81 (!) 116/58 (!) 116/58  Pulse: 80 77 78   Resp: 14 15 18    SpO2: 96% 94% 96%   Weight:      Height:       General: Not in acute distress. Pt is anxious. HEENT:       Eyes: PERRL, EOMI, no scleral icterus.       ENT: No discharge from the ears and nose, no pharynx injection, no tonsillar enlargement.        Neck: No JVD, no bruit, no mass felt. Heme: No neck lymph node enlargement. Cardiac: S1/S2, RRR, No murmurs, No gallops or rubs. Respiratory: No rales, wheezing, rhonchi or rubs. GI: Soft,  nondistended, nontender, no rebound pain, no organomegaly, BS present. GU: No hematuria Ext: has left leg edema. 2+DP/PT pulse bilaterally. Musculoskeletal: has tenderness in left ankle and left lower leg Skin: No rashes.  Neuro: Alert, oriented X3, cranial nerves II-XII grossly intact, moves all extremities normally.  Psych: Patient is not psychotic, no suicidal or hemocidal ideation.  Labs on Admission: I have personally reviewed following labs and imaging studies  CBC: Recent Labs  Lab 09/12/18 2130 09/12/18 2143  WBC 9.9  --   NEUTROABS 7.1  --   HGB 10.8* 11.6*  HCT 35.2* 34.0*  MCV 85.9  --   PLT 286  --    Basic Metabolic Panel: Recent Labs  Lab 09/12/18 2130 09/12/18 2143  NA 139 137  K 3.9 3.8  CL 103 103  CO2 25  --   GLUCOSE 115* 109*  BUN 13 14  CREATININE 0.90 0.90  CALCIUM 9.3  --    GFR: Estimated Creatinine Clearance: 76.2 mL/min (by C-G formula based on SCr of 0.9 mg/dL). Liver Function Tests: Recent Labs  Lab 09/12/18 2130  AST 12*  ALT 12  ALKPHOS 43  BILITOT 0.3  PROT 6.3*  ALBUMIN 3.5   No results for input(s): LIPASE, AMYLASE in the last 168 hours. No results for input(s): AMMONIA in the last 168 hours. Coagulation Profile: Recent Labs  Lab 09/12/18 2130  INR 1.07   Cardiac Enzymes: No results for input(s): CKTOTAL, CKMB, CKMBINDEX, TROPONINI in the last 168 hours. BNP (last 3 results) No results for input(s): PROBNP in the last 8760 hours. HbA1C: No results for input(s): HGBA1C in the last 72 hours. CBG: No results for input(s): GLUCAP in the last 168 hours. Lipid Profile: No results for input(s): CHOL, HDL, LDLCALC, TRIG, CHOLHDL, LDLDIRECT in the last 72 hours. Thyroid Function Tests: No results for input(s): TSH, T4TOTAL, FREET4, T3FREE, THYROIDAB in the last 72 hours. Anemia Panel: No results for input(s): VITAMINB12, FOLATE, FERRITIN, TIBC, IRON, RETICCTPCT in the last 72 hours. Urine analysis:    Component Value  Date/Time   COLORURINE STRAW (A) 12/25/2017 1512   APPEARANCEUR CLEAR 12/25/2017 1512   LABSPEC 1.002 (L) 12/25/2017  1512   PHURINE 6.0 12/25/2017 1512   GLUCOSEU NEGATIVE 12/25/2017 1512   HGBUR SMALL (A) 12/25/2017 1512   BILIRUBINUR NEGATIVE 12/25/2017 1512   BILIRUBINUR neg 05/23/2016 1424   KETONESUR NEGATIVE 12/25/2017 1512   PROTEINUR NEGATIVE 12/25/2017 1512   UROBILINOGEN 0.2 05/23/2016 1424   UROBILINOGEN 0.2 04/14/2014 0410   NITRITE NEGATIVE 12/25/2017 1512   LEUKOCYTESUR MODERATE (A) 12/25/2017 1512   Sepsis Labs: @LABRCNTIP (procalcitonin:4,lacticidven:4) )No results found for this or any previous visit (from the past 240 hour(s)).   Radiological Exams on Admission: Dg Ankle Complete Left  Result Date: 09/11/2018 CLINICAL DATA:  Status post 3 falls, with left ankle pain, swelling and bruising. Obvious left ankle deformity. Initial encounter. EXAM: LEFT ANKLE COMPLETE - 3+ VIEW COMPARISON:  None. FINDINGS: There is a laterally displaced fracture of the distal fibula, with lateral dislocation of the talus. No definite medial malleolar or posterior malleolar fracture is seen. Scattered accessory ossicles are noted at the distal Achilles tendon. A plantar calcaneal spur is seen. The subtalar joint is grossly unremarkable. Diffuse soft tissue swelling is noted about the ankle. IMPRESSION: Laterally displaced fracture of the distal fibula, with lateral dislocation of the talus. Electronically Signed   By: Garald Balding M.D.   On: 09/11/2018 04:01   Ct Angio Chest Pe W And/or Wo Contrast  Result Date: 09/12/2018 CLINICAL DATA:  Shortness of breath.  History of prior DVTs. EXAM: CT ANGIOGRAPHY CHEST WITH CONTRAST TECHNIQUE: Multidetector CT imaging of the chest was performed using the standard protocol during bolus administration of intravenous contrast. Multiplanar CT image reconstructions and MIPs were obtained to evaluate the vascular anatomy. CONTRAST:  193mL ISOVUE-370  IOPAMIDOL (ISOVUE-370) INJECTION 76% COMPARISON:  June 10, 2018 FINDINGS: Cardiovascular: Satisfactory opacification of the pulmonary arteries to the segmental level. No evidence of pulmonary embolism. The heart size is mildly enlarged. No pericardial effusion. Mediastinum/Nodes: No enlarged mediastinal, hilar, or axillary lymph nodes. Thyroid gland, trachea, and esophagus demonstrate no significant findings. Lungs/Pleura: Patchy ground-glass opacities are identified in bilateral upper lobes slightly more prominent compared to prior CT. There is no pleural effusion. Mild dependent atelectasis of posterior lung bases are noted. Upper Abdomen: No acute abnormality. Musculoskeletal: Mild degenerative joint changes of the spine are noted. Review of the MIP images confirms the above findings. IMPRESSION: No pulmonary embolus. Mild patchy ground-glass opacities are identified in bilateral upper lobes slightly more prominent compared to prior exam. This is nonspecific. Findings can be seen in some degree of pulmonary edema or infectious/inflammatory etiology. Electronically Signed   By: Abelardo Diesel M.D.   On: 09/12/2018 23:25   Dg Chest Port 1 View  Result Date: 09/12/2018 CLINICAL DATA:  Shortness of breath and chest pain starting yesterday. EXAM: PORTABLE CHEST 1 VIEW COMPARISON:  None. FINDINGS: The heart size and mediastinal contours are within normal limits. There is no focal infiltrate, pulmonary edema, or pleural effusion. The visualized skeletal structures are stable. IMPRESSION: No active cardiopulmonary disease. Electronically Signed   By: Abelardo Diesel M.D.   On: 09/12/2018 22:14   Dg Ankle Left Port  Result Date: 09/11/2018 CLINICAL DATA:  Status post reduction of left ankle fracture/dislocation. Initial encounter. EXAM: PORTABLE LEFT ANKLE - 2 VIEW COMPARISON:  Left ankle radiographs performed earlier today at 3:34 a.m. FINDINGS: There has been reduction of the talar dislocation. There is mild  residual lateral displacement of the distal fibular fracture, and mild residual medial widening of the ankle mortise. Residual apparent mild misalignment of the anterior facet of  the subtalar joint may reflect underlying ligamentous injury. No new fractures are seen. A plantar calcaneal spur is noted. The soft tissues are difficult to fully assess due to the overlying splint. IMPRESSION: 1. Successful reduction of talar dislocation. Mild residual lateral displacement of the distal fibular fracture, and mild residual medial widening of the ankle mortise. 2. Residual apparent mild misalignment of the anterior facet of the subtalar joint may reflect underlying ligamentous injury. Electronically Signed   By: Garald Balding M.D.   On: 09/11/2018 06:15   Dg Foot Complete Left  Result Date: 09/11/2018 CLINICAL DATA:  Status post fall, with left ankle pain, swelling, bruising and deformity. Initial encounter. EXAM: LEFT FOOT - COMPLETE 3+ VIEW COMPARISON:  None. FINDINGS: There is lateral displacement of a distal fibular fracture, and lateral anterior dislocation of the talus. Diffuse surrounding soft tissue swelling is noted. There appears to be misalignment of the calcaneus and cuboid at the anterior facet of the subtalar joint; this may reflect underlying ligamentous injury, but is difficult to fully characterize. Accessory ossicles are noted at the distal Achilles tendon. A small plantar calcaneal spur is seen. The subtalar joint is grossly unremarkable. IMPRESSION: 1. Lateral displacement of a distal fibular fracture, and lateral anterior dislocation of the talus. 2. Apparent misalignment of the calcaneus and cuboid at the anterior facet of the subtalar joint; this may reflect underlying ligamentous injury, but is difficult to fully characterize. Electronically Signed   By: Garald Balding M.D.   On: 09/11/2018 04:08     EKG: Independently reviewed.  Sinus rhythm, QTC 438, anteroseptal infarction pattern, poor  R wave progression, LAD, nonspecific T wave change    Assessment/Plan Principal Problem:   Closed left ankle fracture Active Problems:   HTN (hypertension)   GERD (gastroesophageal reflux disease)   Depression with anxiety   Hyperlipidemia   ILD (interstitial lung disease) ([facility])   Vertigo   Closed left ankle fracture: As evidenced by x-ray. Patient has persistent pain now. No neurovascular compromise. Orthopedic surgeon, Dr. Lucia Gaskins was consulted.   - will admit to Med-surg bed - Pain control: morphine prn and percocet - When necessary Zofran for nausea - Robaxin for muscle spasm - type and cross - INR/PTT - PT/OT when able to (not ordered now) - f/u LE venous Doppler to rule out DVT  HTN:  -Continue home medications: Amlodipine, lisinopril, -IV hydralazine prn  GERD: -Protonix  Depression with anxiety: No SI or HI -Continue home Xanax, Prozac, Lamictal  Hyperlipidemia: -Fenofibrate  ILD (interstitial lung disease) ([place]):  -Continue home CellCept -Hold prednisone - Start Solu-Cortef 50 mg twice daily as stress dose -PRN albuterol nebulizers shortness of breath and Mucinex for cough  History of vertigo: -As needed meclizine   Inpatient status:  # Patient requires inpatient status due to high intensity of service, high risk for further deterioration and high frequency of surveillance required.  I certify that at the point of admission it is my clinical judgment that the patient will require inpatient hospital care spanning beyond 2 midnights from the point of admission.  . This patient has multiple chronic comorbidities including hypertension, hyperlipidemia, interstitial lung disease on 2 L nasal cannula oxygen as needed, GERD, depression with anxiety, fibromyalgia, OSA on CPAP, obesity, vertigo .  Marland Kitchen Now patient has presenting with worsening pain due to left ankle fracture. . The worrisome physical exam findings include left leg swelling and left ankle and lower  leg tenderness . The initial radiographic and laboratory data are worrisome  because of left ankle fracture. . Current medical needs: please see my assessment and plan . Predictability of an adverse outcome (risk): Patient has left ankle fracture, will likely need surgery, therefore will need to stay in hospital for at least 2 days.    DVT ppx: SQ Heparin    Code Status: Full code Family Communication:   Yes, patient's daughter at bed side Disposition Plan:  Anticipate discharge back to previous home environment Consults called: Dr. Lucia Gaskins of ortho Admission status:   medical floor/inpt  Date of Service 09/13/2018    Ivor Costa [facility] Pager [phone]  If 7PM-7AM, please contact night-coverage [email or web address] Password [email or web address] 09/13/2018, 3:13 AM

## 2018-09-13 NOTE — Anesthesia Preprocedure Evaluation (Signed)
Anesthesia Evaluation  Patient identified by MRN, date of birth, ID band Patient awake    Reviewed: Allergy & Precautions, NPO status , Patient's Chart, lab work & pertinent test results  Airway Mallampati: II  TM Distance: >3 FB Neck ROM: Full    Dental  (+) Dental Advisory Given   Pulmonary sleep apnea , former smoker,    Pulmonary exam normal breath sounds clear to auscultation       Cardiovascular hypertension, + CAD  Normal cardiovascular exam Rhythm:Regular Rate:Normal     Neuro/Psych PSYCHIATRIC DISORDERS Anxiety Depression negative neurological ROS     GI/Hepatic Neg liver ROS, GERD  ,  Endo/Other  Morbid obesity  Renal/GU negative Renal ROS  negative genitourinary   Musculoskeletal  (+) Arthritis ,   Abdominal (+) + obese,   Peds negative pediatric ROS (+)  Hematology negative hematology ROS (+)   Anesthesia Other Findings   Reproductive/Obstetrics negative OB ROS                             Anesthesia Physical Anesthesia Plan  ASA: III  Anesthesia Plan: General   Post-op Pain Management: GA combined w/ Regional for post-op pain   Induction: Intravenous  PONV Risk Score and Plan: 3 and Ondansetron, Dexamethasone and Midazolam  Airway Management Planned: LMA  Additional Equipment:   Intra-op Plan:   Post-operative Plan: Extubation in OR  Informed Consent: I have reviewed the patients History and Physical, chart, labs and discussed the procedure including the risks, benefits and alternatives for the proposed anesthesia with the patient or authorized representative who has indicated his/her understanding and acceptance.   Dental advisory given  Plan Discussed with: CRNA  Anesthesia Plan Comments:         Anesthesia Quick Evaluation

## 2018-09-13 NOTE — ED Notes (Signed)
Pt still hyperventilating she is c/o pain in that lt nakle she wants surgery done on it.  The daughter at bedside is asking for 02 to be placed  pts 02 sats is 36  edmitting doctor advised of pts and daughters concerns  At present her lt lower extremity remains un wrapped    Ativan given as ordered

## 2018-09-13 NOTE — Anesthesia Procedure Notes (Signed)
Procedure Name: LMA Insertion Date/Time: 09/13/2018 1:10 PM Performed by: Adalberto Ill, CRNA Pre-anesthesia Checklist: Patient identified, Emergency Drugs available, Suction available, Patient being monitored and Timeout performed Patient Re-evaluated:Patient Re-evaluated prior to induction Oxygen Delivery Method: Circle system utilized Preoxygenation: Pre-oxygenation with 100% oxygen Induction Type: IV induction Ventilation: Mask ventilation without difficulty LMA: LMA inserted LMA Size: 5.0 Number of attempts: 1 (brief atraumatic easily placed lubricated with ky jelly on both sides of LMA) Placement Confirmation: positive ETCO2 and breath sounds checked- equal and bilateral ETT to lip (cm): yes. Tube secured with: Tape Dental Injury: Teeth and Oropharynx as per pre-operative assessment

## 2018-09-13 NOTE — Anesthesia Postprocedure Evaluation (Signed)
Anesthesia Post Note  Patient: Stacey Alvarez  Procedure(s) Performed: OPEN REDUCTION INTERNAL FIXATION (ORIF) ANKLE FRACTURE (Left Leg Lower)     Patient location during evaluation: PACU Anesthesia Type: General Level of consciousness: sedated and patient cooperative Pain management: pain level controlled Vital Signs Assessment: post-procedure vital signs reviewed and stable Respiratory status: spontaneous breathing Cardiovascular status: stable Anesthetic complications: no    Last Vitals:  Vitals:   09/13/18 1448 09/13/18 1453  BP: 130/65 127/66  Pulse: 91 88  Resp: 14 10  Temp:  36.5 C  SpO2: 98% 97%    Last Pain:  Vitals:   09/13/18 1525  TempSrc:   PainSc: 0-No pain                 Nolon Nations

## 2018-09-13 NOTE — ED Notes (Signed)
The pt is hyperventilating again crying with ankle pain .  She has had paion med ortho tech at bedside  Replacing the splint

## 2018-09-13 NOTE — Progress Notes (Signed)
Report called to Horris Latino, RN in Short Stay.  Patient is ready for OR

## 2018-09-13 NOTE — Anesthesia Procedure Notes (Signed)
Anesthesia Regional Block: Popliteal block   Pre-Anesthetic Checklist: ,, timeout performed, Correct Patient, Correct Site, Correct Laterality, Correct Procedure, Correct Position, site marked, Risks and benefits discussed,  Surgical consent,  Pre-op evaluation,  At surgeon's request and post-op pain management  Laterality: Left  Prep: chloraprep       Needles:  Injection technique: Single-shot  Needle Type: Stimiplex     Needle Length: 10cm  Needle Gauge: 21     Additional Needles:   Procedures:,,,, ultrasound used (permanent image in chart),,,,  Motor weakness within 5 minutes.   Nerve Stimulator or Paresthesia:  Response: Plantar flexion/toe flexion, 0.5 mA,   Additional Responses:   Narrative:  Start time: 09/13/2018 11:44 AM End time: 09/13/2018 11:49 AM Injection made incrementally with aspirations every 5 mL.  Performed by: Personally  Anesthesiologist: Nolon Nations, MD  Additional Notes: Nerve located and needle positioned with direct ultrasound guidance. Good perineural spread. Patient tolerated well.

## 2018-09-14 ENCOUNTER — Other Ambulatory Visit: Payer: Self-pay

## 2018-09-14 ENCOUNTER — Inpatient Hospital Stay (HOSPITAL_BASED_OUTPATIENT_CLINIC_OR_DEPARTMENT_OTHER): Payer: BLUE CROSS/BLUE SHIELD

## 2018-09-14 DIAGNOSIS — R609 Edema, unspecified: Secondary | ICD-10-CM

## 2018-09-14 LAB — HIV ANTIBODY (ROUTINE TESTING W REFLEX): HIV Screen 4th Generation wRfx: NONREACTIVE

## 2018-09-14 MED ORDER — PREDNISONE 5 MG PO TABS
2.5000 mg | ORAL_TABLET | Freq: Every day | ORAL | Status: DC
  Administered 2018-09-15 – 2018-09-17 (×3): 2.5 mg via ORAL
  Filled 2018-09-14 (×3): qty 1

## 2018-09-14 NOTE — Plan of Care (Signed)
Problem: Education: Goal: Knowledge of General Education information will improve Description Including pain rating scale, medication(s)/side effects and non-pharmacologic comfort measures Outcome: Progressing   Problem: Health Behavior/Discharge Planning: Goal: Ability to manage health-related needs will improve Outcome: Progressing   Problem: Clinical Measurements: Goal: Respiratory complications will improve Outcome: Progressing Goal: Cardiovascular complication will be avoided Outcome: Progressing   Problem: Nutrition: Goal: Adequate nutrition will be maintained Outcome: Progressing   Problem: Coping: Goal: Level of anxiety will decrease Outcome: Progressing   Problem: Elimination: Goal: Will not experience complications related to urinary retention Outcome: Progressing   Problem: Pain Managment: Goal: General experience of comfort will improve Outcome: Progressing   Problem: Safety: Goal: Ability to remain free from injury will improve Outcome: Progressing   Problem: Skin Integrity: Goal: Risk for impaired skin integrity will decrease Outcome: Progressing   

## 2018-09-14 NOTE — Evaluation (Signed)
Physical Therapy Evaluation Patient Details Name: Stacey Alvarez MRN: [id number] DOB: 11/05/1955 Today's Date: 09/14/2018   History of Present Illness  62 y.o. female with a history of interstitial lung disease, GERD, hypertension, depression and anxiety, recently had a fall and suffered a left ankle fracture. She presented with worsening swelling/pain related to her recent fracture and underwent ORIF of her left ankle 09/13/18  Clinical Impression  Prior to fall that resulted in L ankle fracture pt living alone in single story home with 2 steps to enter. Pt had developed increasingly debilitating vertigo symptoms that limited her community ambulation, but otherwise was independent with ambulation and ADLs. Pt currently continues to be limited by vertigo symptoms as well as decreased strength and mobility due to NWB through L LE. Pt currently mod I for bed mobility, and minA for transfer to recliner. Pt lacks strength to hop forward using UE on RW. PT recommending SNF level rehab prior to going home to improve strength and mobility. PT also recommending Vestibular Evaluation with therapy tomorrow.      Follow Up Recommendations SNF    Equipment Recommendations  Other (comment)(TBD at next venue)    Recommendations for Other Services       Precautions / Restrictions Precautions Precautions: Fall Precaution Comments: hx Vertigo Restrictions Weight Bearing Restrictions: Yes LLE Weight Bearing: Non weight bearing      Mobility  Bed Mobility Overal bed mobility: Modified Independent             General bed mobility comments: HOB elevated and use of bedrail to pull to EoB  Transfers Overall transfer level: Needs assistance Equipment used: Rolling walker (2 wheeled) Transfers: Sit to/from [facility] Sit to Stand: Min assist;From elevated surface Stand pivot transfers: Min assist       General transfer comment: minA for steadying with power up, dizziness on  standing, min A for steadying with lifting up on ball of foot and twisting on foot to pivot from bed to chair   Ambulation/Gait             General Gait Details: unable to hop on R LE to advance mobility          Balance Overall balance assessment: Needs assistance Sitting-balance support: No upper extremity supported;Feet supported Sitting balance-Leahy Scale: Fair     Standing balance support: Bilateral upper extremity supported Standing balance-Leahy Scale: Poor                               Pertinent Vitals/Pain Pain Assessment: No/denies pain    Home Living Family/patient expects to be discharged to:: Private residence Living Arrangements: Alone Available Help at Discharge: Available PRN/intermittently;Family;Friend(s) Type of Home: House Home Access: Stairs to enter Entrance Stairs-Rails: None Entrance Stairs-Number of Steps: 2 Home Layout: One level;Able to live on main level with bedroom/bathroom Home Equipment: Bedside commode;Walker - 2 wheels;Shower seat;Other (comment)(knee scooter)      Prior Function Level of Independence: Independent         Comments: having trouble with vertigo, limiting her from going out     Hand Dominance        Extremity/Trunk Assessment   Upper Extremity Assessment Upper Extremity Assessment: Overall WFL for tasks assessed    Lower Extremity Assessment Lower Extremity Assessment: RLE deficits/detail;LLE deficits/detail RLE Deficits / Details: AROM WFL, Strength grossly assessed hip 3+/5, knee flex 4/5 knee ext 3+/5, ankle dorsi/plantarflex 4/5 LLE Deficits / Details: L  ankle ROM limited by short cast, knee and hip AROM limite by weight of cast, strength grossly assessed in hip and knee 3+/5       Communication   Communication: No difficulties  Cognition Arousal/Alertness: Awake/alert Behavior During Therapy: WFL for tasks assessed/performed Overall Cognitive Status: Within Functional Limits for  tasks assessed                                        General Comments General comments (skin integrity, edema, etc.): Appears to have 1 beat nystagmus with looking to L, and with focus on finger and rotation of head.          Assessment/Plan    PT Assessment Patient needs continued PT services  PT Problem List Decreased strength;Decreased activity tolerance;Decreased balance;Decreased mobility;Decreased knowledge of use of DME       PT Treatment Interventions DME instruction;Gait training;Stair training;Functional mobility training;Therapeutic activities;Therapeutic exercise;Cognitive remediation;Patient/family education    PT Goals (Current goals can be found in the Care Plan section)  Acute Rehab PT Goals Patient Stated Goal: get strong enough to get around PT Goal Formulation: With patient Time For Goal Achievement: 09/28/18 Potential to Achieve Goals: Good    Frequency Min 3X/week    AM-PAC PT "6 Clicks" Mobility  Outcome Measure Help needed turning from your back to your side while in a flat bed without using bedrails?: A Little Help needed moving from lying on your back to sitting on the side of a flat bed without using bedrails?: A Little Help needed moving to and from a bed to a chair (including a wheelchair)?: A Little Help needed standing up from a chair using your arms (e.g., wheelchair or bedside chair)?: A Little Help needed to walk in hospital room?: Total Help needed climbing 3-5 steps with a railing? : Total 6 Click Score: 14    End of Session Equipment Utilized During Treatment: Gait belt Activity Tolerance: Patient tolerated treatment well Patient left: in chair;with call bell/phone within reach;with chair alarm set;with family/visitor present Nurse Communication: Mobility status PT Visit Diagnosis: Other abnormalities of gait and mobility (R26.89);Muscle weakness (generalized) (M62.81);History of falling (Z91.81);Repeated falls  (R29.6);Difficulty in walking, not elsewhere classified (R26.2);Dizziness and giddiness (R42)    Time: 7106-2694 PT Time Calculation (min) (ACUTE ONLY): 24 min   Charges:   PT Evaluation $PT Eval Moderate Complexity: 1 Mod PT Treatments $Gait Training: 8-22 mins        Sumiko Ceasar B. Migdalia Dk PT, DPT Acute Rehabilitation Services Pager [phone] Office [phone]   [place] 09/14/2018, 1:06 PM

## 2018-09-14 NOTE — Progress Notes (Signed)
LLE venous duplex       has been completed. Preliminary results can be found under CV proc through chart review. Pryce Folts, BS, RDMS, RVT    

## 2018-09-14 NOTE — Progress Notes (Signed)
PROGRESS NOTE    Stacey Alvarez  WNU:[id number] DOB: 06/18/1956 DOA: 09/12/2018 PCP: Midge Minium, MD   Brief Narrative: Stacey Alvarez is a 63 y.o. female with a history of interstitial lung disease, GERD, hypertension, depression and anxiety, recently had a fall and suffered a left ankle fracture. She presented with worsening swelling/pain related to her recent fracture and underwent ORIF of her left ankle.   Assessment & Plan:   Principal Problem:   Closed left ankle fracture Active Problems:   HTN (hypertension)   GERD (gastroesophageal reflux disease)   Depression with anxiety   Hyperlipidemia   ILD (interstitial lung disease) (HCC)   Vertigo   Closed ankle fracture Suffered after fall. S/p ORIF on 12/29. Pain control. PT pending -Orthopedic surgery recommendations: NWB of left leg -PT recommendations: pending -CSW consult for likely rehab need  Essential hypertension Controlled -Continue amlodipine and lisinopril  GERD -Continue Protonix  Interstitial lung disease Seen by pulmonology at [facility]. Currently on CellCept and prednisone. -Discontinue Solu-cortef -Resume home prednisone 2.5 mg (confirmed with patient's pharmacy on 12/30)  History of vertigo Patient is scheduled to follow-up with neurology in February  Hyperlipidemia -Continue fenofibrate  Depression/anxiety -Continue Prozac, lamictal and Xanax  DVT prophylaxis: Per orthopedic surgery Code Status:   Code Status: Full Code Family Communication: Daughter at bedside Disposition Plan: Discharge likely to SNF pending PT recommendations   Consultants:   Orthopedic surgery  Procedures:   12/29: ORIF  Antimicrobials:  None    Subjective: Concerned about going home as she lives alone and is unable to ambulate well.  Objective: Vitals:   09/14/18 0006 09/14/18 0545 09/14/18 0947 09/14/18 1015  BP: 129/64 110/66 (!) 125/56 (!) 125/58  Pulse: 83 77  80  Resp:    16  Temp: 98.4 F  (36.9 C) 98 F (36.7 C)  98 F (36.7 C)  TempSrc: Oral Oral  Oral  SpO2: 99% 100%  100%  Weight:      Height:        Intake/Output Summary (Last 24 hours) at 09/14/2018 1241 Last data filed at 09/14/2018 1100 Gross per 24 hour  Intake 1990 ml  Output 2660 ml  Net -670 ml   Filed Weights   09/12/18 2120  Weight: 108.9 kg    Examination:  General exam: Appears calm and comfortable Respiratory system: Clear to auscultation. Respiratory effort normal. Cardiovascular system: S1 & S2 heard, RRR. 1/6 systolic murmur Gastrointestinal system: Abdomen is nondistended, soft and nontender. No organomegaly or masses felt. Normal bowel sounds heard. Central nervous system: Alert and oriented. No focal neurological deficits. Extremities: No edema. No calf tenderness Skin: No cyanosis. No rashes Psychiatry: Judgement and insight appear normal. Mood & affect appropriate.     Data Reviewed: I have personally reviewed following labs and imaging studies  CBC: Recent Labs  Lab 09/12/18 2130 09/12/18 2143 09/13/18 0255  WBC 9.9  --  8.4  NEUTROABS 7.1  --   --   HGB 10.8* 11.6* 9.9*  HCT 35.2* 34.0* 31.6*  MCV 85.9  --  86.6  PLT 286  --  034   Basic Metabolic Panel: Recent Labs  Lab 09/12/18 2130 09/12/18 2143 09/13/18 0255  NA 139 137 138  K 3.9 3.8 3.6  CL 103 103 103  CO2 25  --  25  GLUCOSE 115* 109* 114*  BUN 13 14 11   CREATININE 0.90 0.90 0.75  CALCIUM 9.3  --  8.6*   GFR: Estimated Creatinine Clearance: 85.7 mL/min (  by C-G formula based on SCr of 0.75 mg/dL). Liver Function Tests: Recent Labs  Lab 09/12/18 2130  AST 12*  ALT 12  ALKPHOS 43  BILITOT 0.3  PROT 6.3*  ALBUMIN 3.5   No results for input(s): LIPASE, AMYLASE in the last 168 hours. No results for input(s): AMMONIA in the last 168 hours. Coagulation Profile: Recent Labs  Lab 09/12/18 2130  INR 1.07   Cardiac Enzymes: Recent Labs  Lab 09/13/18 0255  CKTOTAL 33*   BNP (last 3  results) No results for input(s): PROBNP in the last 8760 hours. HbA1C: No results for input(s): HGBA1C in the last 72 hours. CBG: No results for input(s): GLUCAP in the last 168 hours. Lipid Profile: No results for input(s): CHOL, HDL, LDLCALC, TRIG, CHOLHDL, LDLDIRECT in the last 72 hours. Thyroid Function Tests: No results for input(s): TSH, T4TOTAL, FREET4, T3FREE, THYROIDAB in the last 72 hours. Anemia Panel: No results for input(s): VITAMINB12, FOLATE, FERRITIN, TIBC, IRON, RETICCTPCT in the last 72 hours. Sepsis Labs: No results for input(s): PROCALCITON, LATICACIDVEN in the last 168 hours.  Recent Results (from the past 240 hour(s))  Respiratory Panel by PCR     Status: None   Collection Time: 09/13/18  3:13 AM  Result Value Ref Range Status   Adenovirus NOT DETECTED NOT DETECTED Final   Coronavirus 229E NOT DETECTED NOT DETECTED Final   Coronavirus HKU1 NOT DETECTED NOT DETECTED Final   Coronavirus NL63 NOT DETECTED NOT DETECTED Final   Coronavirus OC43 NOT DETECTED NOT DETECTED Final   Metapneumovirus NOT DETECTED NOT DETECTED Final   Rhinovirus / Enterovirus NOT DETECTED NOT DETECTED Final   Influenza A NOT DETECTED NOT DETECTED Final   Influenza B NOT DETECTED NOT DETECTED Final   Parainfluenza Virus 1 NOT DETECTED NOT DETECTED Final   Parainfluenza Virus 2 NOT DETECTED NOT DETECTED Final   Parainfluenza Virus 3 NOT DETECTED NOT DETECTED Final   Parainfluenza Virus 4 NOT DETECTED NOT DETECTED Final   Respiratory Syncytial Virus NOT DETECTED NOT DETECTED Final   Bordetella pertussis NOT DETECTED NOT DETECTED Final   Chlamydophila pneumoniae NOT DETECTED NOT DETECTED Final   Mycoplasma pneumoniae NOT DETECTED NOT DETECTED Final         Radiology Studies: Dg Ankle Complete Left  Result Date: 09/13/2018 CLINICAL DATA:  Left ankle fracture dislocation. EXAM: DG C-ARM 61-120 MIN; LEFT ANKLE COMPLETE - 3+ VIEW COMPARISON:  Radiographs dated 09/11/2018 FINDINGS:  Three C-arm images demonstrate the patient has undergone open reduction and internal fixation of the fracture dislocation at the left ankle. Hardware appears in good position. Dislocation has been completely reduced. Alignment is essentially anatomic. IMPRESSION: Open reduction and internal fixation of fracture dislocation of the left ankle. FLUOROSCOPY TIME:  34 seconds C-arm fluoroscopic images were obtained intraoperatively and submitted for post operative interpretation. Electronically Signed   By: Lorriane Shire M.D.   On: 09/13/2018 15:29   Ct Angio Chest Pe W And/or Wo Contrast  Result Date: 09/12/2018 CLINICAL DATA:  Shortness of breath.  History of prior DVTs. EXAM: CT ANGIOGRAPHY CHEST WITH CONTRAST TECHNIQUE: Multidetector CT imaging of the chest was performed using the standard protocol during bolus administration of intravenous contrast. Multiplanar CT image reconstructions and MIPs were obtained to evaluate the vascular anatomy. CONTRAST:  138mL ISOVUE-370 IOPAMIDOL (ISOVUE-370) INJECTION 76% COMPARISON:  June 10, 2018 FINDINGS: Cardiovascular: Satisfactory opacification of the pulmonary arteries to the segmental level. No evidence of pulmonary embolism. The heart size is mildly enlarged. No pericardial effusion.  Mediastinum/Nodes: No enlarged mediastinal, hilar, or axillary lymph nodes. Thyroid gland, trachea, and esophagus demonstrate no significant findings. Lungs/Pleura: Patchy ground-glass opacities are identified in bilateral upper lobes slightly more prominent compared to prior CT. There is no pleural effusion. Mild dependent atelectasis of posterior lung bases are noted. Upper Abdomen: No acute abnormality. Musculoskeletal: Mild degenerative joint changes of the spine are noted. Review of the MIP images confirms the above findings. IMPRESSION: No pulmonary embolus. Mild patchy ground-glass opacities are identified in bilateral upper lobes slightly more prominent compared to prior  exam. This is nonspecific. Findings can be seen in some degree of pulmonary edema or infectious/inflammatory etiology. Electronically Signed   By: Abelardo Diesel M.D.   On: 09/12/2018 23:25   Dg Chest Port 1 View  Result Date: 09/12/2018 CLINICAL DATA:  Shortness of breath and chest pain starting yesterday. EXAM: PORTABLE CHEST 1 VIEW COMPARISON:  None. FINDINGS: The heart size and mediastinal contours are within normal limits. There is no focal infiltrate, pulmonary edema, or pleural effusion. The visualized skeletal structures are stable. IMPRESSION: No active cardiopulmonary disease. Electronically Signed   By: Abelardo Diesel M.D.   On: 09/12/2018 22:14   Dg C-arm 1-60 Min  Result Date: 09/13/2018 CLINICAL DATA:  Left ankle fracture dislocation. EXAM: DG C-ARM 61-120 MIN; LEFT ANKLE COMPLETE - 3+ VIEW COMPARISON:  Radiographs dated 09/11/2018 FINDINGS: Three C-arm images demonstrate the patient has undergone open reduction and internal fixation of the fracture dislocation at the left ankle. Hardware appears in good position. Dislocation has been completely reduced. Alignment is essentially anatomic. IMPRESSION: Open reduction and internal fixation of fracture dislocation of the left ankle. FLUOROSCOPY TIME:  34 seconds C-arm fluoroscopic images were obtained intraoperatively and submitted for post operative interpretation. Electronically Signed   By: Lorriane Shire M.D.   On: 09/13/2018 15:29        Scheduled Meds: . ALPRAZolam  0.5 mg Oral Daily  . ALPRAZolam  1 mg Oral QHS  . amLODipine  2.5 mg Oral Daily  . amLODipine  5 mg Oral QHS  . docusate sodium  100 mg Oral BID  . fenofibrate  160 mg Oral Daily  . FLUoxetine  60 mg Oral Daily  . fluticasone  1 spray Each Nare Daily  . heparin  5,000 Units Subcutaneous Q8H  . hydrocortisone sod succinate (SOLU-CORTEF) inj  50 mg Intravenous Q12H  . lamoTRIgine  100 mg Oral Daily  . lisinopril  20 mg Oral BID  . loratadine  10 mg Oral Daily  .  LORazepam  1 mg Intravenous Once  . mycophenolate  500 mg Oral BID  . pantoprazole  40 mg Oral BID  . pravastatin  40 mg Oral q1800  . temazepam  30 mg Oral QHS   Continuous Infusions: . lactated ringers 10 mL/hr at 09/14/18 0200     LOS: 1 day     Cordelia Poche, MD [facility] 09/14/2018, 12:41 PM  If 7PM-7AM, please contact night-coverage [email or web address]

## 2018-09-14 NOTE — Clinical Social Work Note (Signed)
Clinical Social Work Assessment  Patient Details  Name: Stacey Alvarez MRN: [id number] Date of Birth: 19-Jul-1956  Date of referral:  09/14/18               Reason for consult:  Discharge Planning                Permission sought to share information with:  Case Manager, Facility Sport and exercise psychologist, Family Supports Permission granted to share information::  Yes, Verbal Permission Granted  Name::     Stacey Alvarez  Agency::  SNFs  Relationship::  daughter  Contact Information:     [phone]   Housing/Transportation Living arrangements for the past 2 months:  [place] of Information:  Patient Patient Interpreter Needed:  None Criminal Activity/Legal Involvement Pertinent to Current Situation/Hospitalization:  No - Comment as needed Significant Relationships:  Adult Children Lives with:  Self Do you feel safe going back to the place where you live?  No Need for family participation in patient care:  No (Coment)  Care giving concerns:  CSW received referral for possible SNF placement at time of discharge. Spoke with patient regarding possibility of SNF placement . Patient's family and daughter Stacey Alvarez at bedside is currently unable to care for her at their home given patient's current needs and fall risk.  Patient and daughter Stacey Alvarez at bedside expressed understanding of PT recommendation and are agreeable to SNF placement at time of discharge. CSW to continue to follow and assist with discharge planning needs.     Social Worker assessment / plan:  Spoke with patient and  Daughter Stacey Alvarez at bedside concerning possibility of rehab at [facility] before returning home.    Employment status:  Retired Nurse, adult PT Recommendations:  [place] / Referral to community resources:  [place]  Patient/Family's Response to care:  Patient and  Daughter Stacey Alvarez at bedside recognize need for rehab before returning home and  are agreeable to a SNF in [place]. They report preference for  [facility] however not in network  . CSW explained insurance authorization process. Patient's family reported that they want patient to get stronger to be able to come back home.    Patient/Family's Understanding of and Emotional Response to Diagnosis, Current Treatment, and Prognosis:  Patient/family is realistic regarding therapy needs and expressed being hopeful for SNF placement. Patient expressed understanding of CSW role and discharge process as well as medical condition. No questions/concerns about plan or treatment.    Emotional Assessment Appearance:  Appears stated age Attitude/Demeanor/Rapport:  Self-Confident Affect (typically observed):  Accepting Orientation:  Oriented to Self, Oriented to Place, Oriented to  Time, Oriented to Situation Alcohol / Substance use:  Not Applicable Psych involvement (Current and /or in the community):  No (Comment)  Discharge Needs  Concerns to be addressed:  Discharge Planning Concerns Readmission within the last 30 days:  No Current discharge risk:  Dependent with Mobility Barriers to Discharge:  Continued Medical Work up   [employer], LCSW 09/14/2018, 3:16 PM

## 2018-09-14 NOTE — Progress Notes (Signed)
Subjective: 1 Day Post-Op Procedure(s) (LRB): OPEN REDUCTION INTERNAL FIXATION (ORIF) ANKLE FRACTURE (Left)  Patient doing well this morning.  Pain is controlled.  She is respirating well without nasal cannula.  She has not mobilized with physical therapy yet.  She is concerned with disposition given that she lives alone.  Denies any shortness of breath.  Objective: Vital signs in last 24 hours: Temp:  [97.5 F (36.4 C)-98.4 F (36.9 C)] 98 F (36.7 C) (12/30 0545) Pulse Rate:  [72-91] 77 (12/30 0545) Resp:  [10-28] 10 (12/29 1453) BP: (104-166)/(44-74) 110/66 (12/30 0545) SpO2:  [97 %-100 %] 100 % (12/30 0545)  Intake/Output from previous day: 12/29 0701 - 12/30 0700 In: 1990 [P.O.:840; I.V.:1050; IV Piggyback:100] Out: 2010 [Urine:2000; Blood:10] Intake/Output this shift: No intake/output data recorded.  Recent Labs    09/12/18 2130 09/12/18 2143 09/13/18 0255  HGB 10.8* 11.6* 9.9*   Recent Labs    09/12/18 2130 09/12/18 2143 09/13/18 0255  WBC 9.9  --  8.4  RBC 4.10  --  3.65*  HCT 35.2* 34.0* 31.6*  PLT 286  --  263   Recent Labs    09/12/18 2130 09/12/18 2143 09/13/18 0255  NA 139 137 138  K 3.9 3.8 3.6  CL 103 103 103  CO2 25  --  25  BUN 13 14 11   CREATININE 0.90 0.90 0.75  GLUCOSE 115* 109* 114*  CALCIUM 9.3  --  8.6*   Recent Labs    09/12/18 2130  INR 1.07   Awake and alert and in no acute distress. Left lower extremity in a splint.  She is unable to wiggle the toes due to block.  No sensation light touch about the toes due to the block.  Toes are warm and well-perfused with brisk capillary refill.  No tenderness palpation proximal to the splint.  Assessment/Plan: 1 Day Post-Op Procedure(s) (LRB): OPEN REDUCTION INTERNAL FIXATION (ORIF) ANKLE FRACTURE (Left)  Patient doing well.  She will mobilize with physical therapy.  Based on their recommendations we will plan for discharge. Lovenox for DVT prophylaxis given her history of blood clots.   She will be discharged on Lovenox as well. Continue with pain control while in house.   Completed perioperative antibiotics.  She will remain nonweightbearing on the left lower extremity. She will follow-up in 2 weeks for x-rays and suture removal and cast placement.    Erle Crocker 09/14/2018, 9:49 AM

## 2018-09-14 NOTE — Progress Notes (Signed)
Pt places self on/off cpap when needed. RT lowered humidity per pt request and filled O2 chamber.  RT will monitor.

## 2018-09-14 NOTE — [place] FL2 (Signed)
[place] MEDICAID FL2 LEVEL OF CARE SCREENING TOOL     IDENTIFICATION  Patient Name: Stacey Alvarez Birthdate: 09-Oct-1955 Sex: female Admission Date (Current Location): 09/12/2018  [facility] and [place] Number:  Herbalist and Address:  [facility], [place] [address], [place], [place] [address]      Provider Number: [id number]  Attending Physician Name and Address:  Mariel Aloe, MD  Relative Name and Phone Number:  [name] (daughter) [phone]    Current Level of Care: Hospital Recommended Level of Care: [place] Prior Approval Number:    Date Approved/Denied: 09/14/18 PASRR Number: [id number]  Discharge Plan: SNF    Current Diagnoses: Patient Active Problem List   Diagnosis Date Noted  . Closed left ankle fracture 09/13/2018  . Vertigo 08/26/2018  . ILD (interstitial lung disease) ([place]) 10/01/2016  . Atherosclerosis of native coronary artery without angina pectoris 10/01/2016  . Chronic cough 04/05/2016  . Incidental lung nodule, > 77mm and < 33mm 04/05/2016  . History of sleep apnea 04/05/2016  . Back pain 02/07/2016  . Severe obesity (BMI >= 40) ([place]) 11/24/2015  . Hyperlipidemia 05/24/2015  . Diverticulitis 01/09/2015  . Routine general medical examination at a health care facility 11/21/2014  . HTN (hypertension) 10/28/2013  . OSA (obstructive sleep apnea) 10/28/2013  . Insulin resistance 10/28/2013  . GERD (gastroesophageal reflux disease) 10/28/2013  . Depression with anxiety 10/28/2013  . Postmenopausal HRT (hormone replacement therapy) 10/28/2013    Orientation RESPIRATION BLADDER Height & Weight     Self, Time, Situation, Place  Normal, O2(2L/min nasal cannula oxygen as needed) Continent, External catheter Weight: 108.9 kg Height:  5\' 4"  (162.6 cm)  BEHAVIORAL SYMPTOMS/MOOD NEUROLOGICAL BOWEL NUTRITION STATUS      Continent Diet(see discharge summary)  AMBULATORY STATUS COMMUNICATION OF NEEDS Skin    Limited Assist Verbally Surgical wounds(left leg closed surgical incision)                       Personal Care Assistance Level of Assistance  Bathing, Feeding, Dressing, Total care Bathing Assistance: Limited assistance Feeding assistance: Independent Dressing Assistance: Limited assistance Total Care Assistance: Limited assistance   Functional Limitations Info  Sight, Hearing, Speech Sight Info: Adequate Hearing Info: Adequate Speech Info: Adequate    SPECIAL CARE FACTORS FREQUENCY  PT (By licensed PT), OT (By licensed OT)     PT Frequency: min5x weekly OT Frequency: min 5x weekly            Contractures Contractures Info: Not present    Additional Factors Info  Code Status, Allergies Code Status Info: Full Allergies Info: Macrobid, Sulfa Antibiotics           Current Medications (09/14/2018):  This is the current hospital active medication list Current Facility-Administered Medications  Medication Dose Route Frequency Provider Last Rate Last Dose  . acetaminophen (TYLENOL) tablet 650 mg  650 mg Oral Q6H PRN Erle Crocker, MD       Or  . acetaminophen (TYLENOL) suppository 650 mg  650 mg Rectal Q6H PRN Erle Crocker, MD      . albuterol (PROVENTIL) (2.5 MG/3ML) 0.083% nebulizer solution 2.5 mg  2.5 mg Nebulization Q4H PRN Erle Crocker, MD      . ALPRAZolam Duanne Moron) tablet 0.5 mg  0.5 mg Oral Daily Erle Crocker, MD   0.5 mg at 09/14/18 0947  . ALPRAZolam Duanne Moron) tablet 1 mg  1 mg Oral QHS Erle Crocker,  MD   1 mg at 09/13/18 2026  . amLODipine (NORVASC) tablet 2.5 mg  2.5 mg Oral Daily Erle Crocker, MD   2.5 mg at 09/14/18 0947  . amLODipine (NORVASC) tablet 5 mg  5 mg Oral QHS Erle Crocker, MD   5 mg at 09/13/18 2114  . butalbital-acetaminophen-caffeine (FIORICET, ESGIC) 50-325-40 MG per tablet 1 tablet  1 tablet Oral Q8H PRN Erle Crocker, MD      . dextromethorphan-guaiFENesin [facility] DM) 30-600  MG per 12 hr tablet 1 tablet  1 tablet Oral BID PRN Erle Crocker, MD      . docusate sodium (COLACE) capsule 100 mg  100 mg Oral BID Erle Crocker, MD   100 mg at 09/14/18 0947  . fenofibrate tablet 160 mg  160 mg Oral Daily Erle Crocker, MD   160 mg at 09/14/18 0947  . FLUoxetine (PROZAC) capsule 60 mg  60 mg Oral Daily Erle Crocker, MD   60 mg at 09/14/18 0947  . fluticasone (FLONASE) 50 MCG/ACT nasal spray 1 spray  1 spray Each Nare Daily Erle Crocker, MD   1 spray at 09/14/18 0950  . heparin injection 5,000 Units  5,000 Units Subcutaneous Q8H Erle Crocker, MD   5,000 Units at 09/14/18 1432  . hydrALAZINE (APRESOLINE) injection 5 mg  5 mg Intravenous Q2H PRN Erle Crocker, MD      . lactated ringers infusion   Intravenous Continuous Erle Crocker, MD 10 mL/hr at 09/14/18 0200    . lamoTRIgine (LAMICTAL) tablet 100 mg  100 mg Oral Daily Erle Crocker, MD   100 mg at 09/14/18 0948  . lisinopril (PRINIVIL,ZESTRIL) tablet 20 mg  20 mg Oral BID Erle Crocker, MD   20 mg at 09/14/18 0947  . loratadine (CLARITIN) tablet 10 mg  10 mg Oral Daily Erle Crocker, MD   10 mg at 09/14/18 0947  . LORazepam (ATIVAN) injection 1 mg  1 mg Intravenous Once Erle Crocker, MD   Stopped at 09/13/18 [id number]  . meclizine (ANTIVERT) tablet 25 mg  25 mg Oral TID PRN Erle Crocker, MD      . methocarbamol (ROBAXIN) tablet 500 mg  500 mg Oral Q8H PRN Erle Crocker, MD   500 mg at 09/14/18 0947  . metoCLOPramide (REGLAN) tablet 5-10 mg  5-10 mg Oral Q8H PRN Erle Crocker, MD       Or  . metoCLOPramide (REGLAN) injection 5-10 mg  5-10 mg Intravenous Q8H PRN Erle Crocker, MD      . morphine 2 MG/ML injection 2 mg  2 mg Intravenous Q4H PRN Erle Crocker, MD   2 mg at 09/13/18 0556  . mycophenolate (CELLCEPT) capsule 500 mg  500 mg Oral BID Erle Crocker, MD   500 mg at 09/14/18 0946  . ondansetron  (ZOFRAN) tablet 4 mg  4 mg Oral Q6H PRN Erle Crocker, MD       Or  . ondansetron [facility]) injection 4 mg  4 mg Intravenous Q6H PRN Erle Crocker, MD      . oxyCODONE-acetaminophen (PERCOCET/ROXICET) 5-325 MG per tablet 1 tablet  1 tablet Oral Q4H PRN Erle Crocker, MD   1 tablet at 09/14/18 [phone]  . pantoprazole (PROTONIX) EC tablet 40 mg  40 mg Oral BID Erle Crocker, MD   40 mg at 09/14/18 0947  . pravastatin (PRAVACHOL) tablet 40 mg  40 mg Oral q1800 Erle Crocker, MD   40 mg at 09/13/18 1726  . [START ON 09/15/2018] predniSONE (DELTASONE) tablet 2.5 mg  2.5 mg Oral Q breakfast Mariel Aloe, MD      . promethazine (PHENERGAN) tablet 25 mg  25 mg Oral Q8H PRN Erle Crocker, MD      . senna-docusate (Senokot-S) tablet 1 tablet  1 tablet Oral QHS PRN Erle Crocker, MD      . temazepam (RESTORIL) capsule 30 mg  30 mg Oral QHS Erle Crocker, MD   30 mg at 09/13/18 2114     Discharge Medications: Please see discharge summary for a list of discharge medications.  Relevant Imaging Results:  Relevant Lab Results:   Additional Information SSN: [id number]  Alberteen Sam, LCSW

## 2018-09-15 ENCOUNTER — Encounter (HOSPITAL_COMMUNITY): Payer: Self-pay | Admitting: Orthopaedic Surgery

## 2018-09-15 LAB — CBC
HCT: 34 % — ABNORMAL LOW (ref 36.0–46.0)
Hemoglobin: 10.7 g/dL — ABNORMAL LOW (ref 12.0–15.0)
MCH: 27.4 pg (ref 26.0–34.0)
MCHC: 31.5 g/dL (ref 30.0–36.0)
MCV: 87.2 fL (ref 80.0–100.0)
Platelets: 316 10*3/uL (ref 150–400)
RBC: 3.9 MIL/uL (ref 3.87–5.11)
RDW: 14 % (ref 11.5–15.5)
WBC: 7.6 10*3/uL (ref 4.0–10.5)
nRBC: 0 % (ref 0.0–0.2)

## 2018-09-15 MED ORDER — ALPRAZOLAM 0.5 MG PO TABS
1.0000 mg | ORAL_TABLET | Freq: Every day | ORAL | Status: DC
  Administered 2018-09-15 – 2018-09-17 (×3): 1 mg via ORAL
  Filled 2018-09-15 (×3): qty 2

## 2018-09-15 MED ORDER — OXYCODONE HCL 5 MG PO TABS: 5.0000 mg | ORAL_TABLET | ORAL | 0 refills | Status: AC | PRN

## 2018-09-15 MED ORDER — ALPRAZOLAM 0.5 MG PO TABS
2.0000 mg | ORAL_TABLET | Freq: Every day | ORAL | Status: DC
  Administered 2018-09-16 (×2): 2 mg via ORAL
  Filled 2018-09-15 (×2): qty 4

## 2018-09-15 MED ORDER — ALPRAZOLAM 0.5 MG PO TABS
1.0000 mg | ORAL_TABLET | Freq: Every day | ORAL | Status: DC | PRN
  Administered 2018-09-17: 1 mg via ORAL
  Filled 2018-09-15: qty 2

## 2018-09-15 NOTE — Progress Notes (Signed)
Patient choose [facility] for Rehab. [place] confirmed availability and will start the authorization process.   Thurmond Butts, [place] Social Worker [phone]

## 2018-09-15 NOTE — Progress Notes (Signed)
PROGRESS NOTE    Stacey Alvarez  VZC:[id number] DOB: 09-05-1956 DOA: 09/12/2018 PCP: Midge Minium, MD   Brief Narrative: Stacey Alvarez is a 62 y.o. female with a history of interstitial lung disease, GERD, hypertension, depression and anxiety, recently had a fall and suffered a left ankle fracture. She presented with worsening swelling/pain related to her recent fracture and underwent ORIF of her left ankle.   Assessment & Plan:   Principal Problem:   Closed left ankle fracture Active Problems:   HTN (hypertension)   GERD (gastroesophageal reflux disease)   Depression with anxiety   Hyperlipidemia   ILD (interstitial lung disease) (HCC)   Vertigo   Closed ankle fracture Suffered after fall. S/p ORIF on 12/29. Pain control. PT pending -Orthopedic surgery recommendations: NWB of left leg -PT recommendations: SNF, social work consulted -OT eval for upper body strength exercises  Essential hypertension Controlled -Continue amlodipine and lisinopril  GERD -Continue Protonix  Interstitial lung disease Seen by pulmonology at [facility]. Currently on CellCept and prednisone. -Continue home prednisone 2.5 mg (confirmed with patient's pharmacy on 12/30)  History of vertigo Patient is scheduled to follow-up with neurology in February  Hyperlipidemia -Continue fenofibrate  Depression/anxiety -Continue Prozac, lamictal and Xanax (home regimen ordered; verified on [place] database and patient's Rx bottle).  Insomnia -Continue Restoril  DVT prophylaxis: Per orthopedic surgery Code Status:   Code Status: Full Code Family Communication: Daughter at bedside Disposition Plan: Discharge likely to SNF pending PT recommendations   Consultants:   Orthopedic surgery  Procedures:   12/29: ORIF  Antimicrobials:  None    Subjective: Pain worsened today as she "got behind" on treatment of pain.  Objective: Vitals:   09/14/18 1415 09/14/18 2007 09/15/18 0401 09/15/18 1514    BP: 128/69 130/75 (!) 141/70 130/65  Pulse: 89 85 74 82  Resp: 17 20 16 16   Temp: 97.9 F (36.6 C) 98.6 F (37 C) 98.2 F (36.8 C) 98.5 F (36.9 C)  TempSrc: Oral Oral Oral Oral  SpO2: 97% 99% 96% 96%  Weight:      Height:       No intake or output data in the 24 hours ending 09/15/18 1610 Filed Weights   09/12/18 2120  Weight: 108.9 kg    Examination:  General exam: Appears calm and comfortable Respiratory system: Clear to auscultation. Respiratory effort normal. Cardiovascular system: S1 & S2 heard, RRR. No murmurs, rubs, gallops or clicks. Gastrointestinal system: Abdomen is nondistended, soft and nontender. No organomegaly or masses felt. Normal bowel sounds heard. Central nervous system: Alert and oriented. No focal neurological deficits. Extremities: Left leg in cast. No calf tenderness of right leg Skin: No cyanosis. No rashes Psychiatry: Judgement and insight appear normal. Mood & affect appropriate.     Data Reviewed: I have personally reviewed following labs and imaging studies  CBC: Recent Labs  Lab 09/12/18 2130 09/12/18 2143 09/13/18 0255 09/15/18 0808  WBC 9.9  --  8.4 7.6  NEUTROABS 7.1  --   --   --   HGB 10.8* 11.6* 9.9* 10.7*  HCT 35.2* 34.0* 31.6* 34.0*  MCV 85.9  --  86.6 87.2  PLT 286  --  263 128   Basic Metabolic Panel: Recent Labs  Lab 09/12/18 2130 09/12/18 2143 09/13/18 0255  NA 139 137 138  K 3.9 3.8 3.6  CL 103 103 103  CO2 25  --  25  GLUCOSE 115* 109* 114*  BUN 13 14 11   CREATININE 0.90 0.90 0.75  CALCIUM 9.3  --  8.6*   GFR: Estimated Creatinine Clearance: 85.7 mL/min (by C-G formula based on SCr of 0.75 mg/dL). Liver Function Tests: Recent Labs  Lab 09/12/18 2130  AST 12*  ALT 12  ALKPHOS 43  BILITOT 0.3  PROT 6.3*  ALBUMIN 3.5   No results for input(s): LIPASE, AMYLASE in the last 168 hours. No results for input(s): AMMONIA in the last 168 hours. Coagulation Profile: Recent Labs  Lab 09/12/18 2130   INR 1.07   Cardiac Enzymes: Recent Labs  Lab 09/13/18 0255  CKTOTAL 33*   BNP (last 3 results) No results for input(s): PROBNP in the last 8760 hours. HbA1C: No results for input(s): HGBA1C in the last 72 hours. CBG: No results for input(s): GLUCAP in the last 168 hours. Lipid Profile: No results for input(s): CHOL, HDL, LDLCALC, TRIG, CHOLHDL, LDLDIRECT in the last 72 hours. Thyroid Function Tests: No results for input(s): TSH, T4TOTAL, FREET4, T3FREE, THYROIDAB in the last 72 hours. Anemia Panel: No results for input(s): VITAMINB12, FOLATE, FERRITIN, TIBC, IRON, RETICCTPCT in the last 72 hours. Sepsis Labs: No results for input(s): PROCALCITON, LATICACIDVEN in the last 168 hours.  Recent Results (from the past 240 hour(s))  Respiratory Panel by PCR     Status: None   Collection Time: 09/13/18  3:13 AM  Result Value Ref Range Status   Adenovirus NOT DETECTED NOT DETECTED Final   Coronavirus 229E NOT DETECTED NOT DETECTED Final   Coronavirus HKU1 NOT DETECTED NOT DETECTED Final   Coronavirus NL63 NOT DETECTED NOT DETECTED Final   Coronavirus OC43 NOT DETECTED NOT DETECTED Final   Metapneumovirus NOT DETECTED NOT DETECTED Final   Rhinovirus / Enterovirus NOT DETECTED NOT DETECTED Final   Influenza A NOT DETECTED NOT DETECTED Final   Influenza B NOT DETECTED NOT DETECTED Final   Parainfluenza Virus 1 NOT DETECTED NOT DETECTED Final   Parainfluenza Virus 2 NOT DETECTED NOT DETECTED Final   Parainfluenza Virus 3 NOT DETECTED NOT DETECTED Final   Parainfluenza Virus 4 NOT DETECTED NOT DETECTED Final   Respiratory Syncytial Virus NOT DETECTED NOT DETECTED Final   Bordetella pertussis NOT DETECTED NOT DETECTED Final   Chlamydophila pneumoniae NOT DETECTED NOT DETECTED Final   Mycoplasma pneumoniae NOT DETECTED NOT DETECTED Final         Radiology Studies: Vas [place] Lower Extremity Venous (dvt)  Result Date: 09/14/2018  Lower Venous Study Indications: Swelling, and left  ankle fracture/ calf and foot in cast.  Performing Technologist: June Leap RDMS, RVT  Examination Guidelines: A complete evaluation includes B-mode imaging, spectral Doppler, color Doppler, and power Doppler as needed of all accessible portions of each vessel. Bilateral testing is considered an integral part of a complete examination. Limited examinations for reoccurring indications may be performed as noted.  Left Venous Findings: +---------+---------------+---------+-----------+----------+-------+          CompressibilityPhasicitySpontaneityPropertiesSummary +---------+---------------+---------+-----------+----------+-------+ CFV      Full           Yes      Yes                          +---------+---------------+---------+-----------+----------+-------+ SFJ      Full                                                 +---------+---------------+---------+-----------+----------+-------+  FV Prox  Full                                                 +---------+---------------+---------+-----------+----------+-------+ FV Mid   Full                                                 +---------+---------------+---------+-----------+----------+-------+ FV DistalFull                                                 +---------+---------------+---------+-----------+----------+-------+ PFV      Full                                                 +---------+---------------+---------+-----------+----------+-------+ POP      Full           Yes      Yes                          +---------+---------------+---------+-----------+----------+-------+ left calf unable to image due to cast    Summary: Left: There is no evidence of deep vein thrombosis in the lower extremity. However, portions of this examination were limited- see technologist comments above. No cystic structure found in the popliteal fossa.  *See table(s) above for measurements and observations. Electronically signed  by Deitra Mayo MD on 09/14/2018 at 5:09:11 PM.    Final         Scheduled Meds: . ALPRAZolam  2 mg Oral QHS   And  . ALPRAZolam  1 mg Oral Daily  . amLODipine  2.5 mg Oral Daily  . amLODipine  5 mg Oral QHS  . docusate sodium  100 mg Oral BID  . fenofibrate  160 mg Oral Daily  . FLUoxetine  60 mg Oral Daily  . fluticasone  1 spray Each Nare Daily  . heparin  5,000 Units Subcutaneous Q8H  . lamoTRIgine  100 mg Oral Daily  . lisinopril  20 mg Oral BID  . loratadine  10 mg Oral Daily  . mycophenolate  500 mg Oral BID  . pantoprazole  40 mg Oral BID  . pravastatin  40 mg Oral q1800  . predniSONE  2.5 mg Oral Q breakfast  . temazepam  30 mg Oral QHS   Continuous Infusions: . lactated ringers 10 mL/hr at 09/14/18 0200     LOS: 2 days     Cordelia Poche, MD [facility] 09/15/2018, 4:10 PM  If 7PM-7AM, please contact night-coverage [email or web address]

## 2018-09-15 NOTE — Progress Notes (Signed)
Subjective: 2 Days Post-Op Procedure(s) (LRB): OPEN REDUCTION INTERNAL FIXATION (ORIF) ANKLE FRACTURE (Left)  Patient has increased pain of the block wore off.  It is tolerable.  She mobilized with therapy yesterday and SNF placement was recommended.  She denies any shortness of breath.  Objective: Vital signs in last 24 hours: Temp:  [97.9 F (36.6 C)-98.6 F (37 C)] 98.2 F (36.8 C) (12/31 0401) Pulse Rate:  [74-89] 74 (12/31 0401) Resp:  [16-20] 16 (12/31 0401) BP: (125-141)/(56-75) 141/70 (12/31 0401) SpO2:  [96 %-100 %] 96 % (12/31 0401)  Intake/Output from previous day: 12/30 0701 - 12/31 0700 In: 480 [P.O.:480] Out: 650 [Urine:650] Intake/Output this shift: No intake/output data recorded.  Recent Labs    09/12/18 2130 09/12/18 2143 09/13/18 0255  HGB 10.8* 11.6* 9.9*   Recent Labs    09/12/18 2130 09/12/18 2143 09/13/18 0255  WBC 9.9  --  8.4  RBC 4.10  --  3.65*  HCT 35.2* 34.0* 31.6*  PLT 286  --  263   Recent Labs    09/12/18 2130 09/12/18 2143 09/13/18 0255  NA 139 137 138  K 3.9 3.8 3.6  CL 103 103 103  CO2 25  --  25  BUN 13 14 11   CREATININE 0.90 0.90 0.75  GLUCOSE 115* 109* 114*  CALCIUM 9.3  --  8.6*   Recent Labs    09/12/18 2130  INR 1.07   Patient is awake and alert Respirations even and unlabored. Left lower extremity in splint.  Toes exposed are warm and well-perfused.  She is able to wiggle toes albeit painfully.  She endorses sensation to light touch about the dorsal plantar surface of the toes.  No tenderness palpation proximal to the splint.   Assessment/Plan: 2 Days Post-Op Procedure(s) (LRB): OPEN REDUCTION INTERNAL FIXATION (ORIF) ANKLE FRACTURE (Left)  Patient is doing well status post ORIF of her left ankle fracture. Physical therapy is recommended SNF placement and discharge planning for placement has begun. I have written for pain medication. Patient should be discharged on a formal anticoagulant due to her blood  clot history.  Would appreciate hospitalist team input on appropriate anticoagulant.  She is currently on heparin.  She will maintain nonweightbearing to the left lower extremity Follow-up with me in 2 weeks for x-rays and wound check     Erle Crocker 09/15/2018, 6:59 AM

## 2018-09-15 NOTE — Progress Notes (Signed)
Pt places self on/off cpap when needed. RT will monitor. 

## 2018-09-15 NOTE — Progress Notes (Signed)
Physical Therapy Treatment Patient Details Name: Stacey Alvarez MRN: [id number] DOB: May 04, 1956 Today's Date: 09/15/2018    History of Present Illness 62 y.o. female with a history of interstitial lung disease, GERD, hypertension, depression and anxiety, recently had a fall and suffered a left ankle fracture. She presented with worsening swelling/pain related to her recent fracture and underwent ORIF of her left ankle 09/13/18    PT Comments    Discussed her vertigo and she denies being dizzy with movement at this time but does report having a recent spell that has lasted 4 weeks. Discussed following up at [facility] and/or outpatient once L LE has healed as she most likely has BPPV. Pt was able to hop x 5' today and was given L LE HEP. Acute PT to cont to follow.    Follow Up Recommendations  SNF     Equipment Recommendations       Recommendations for Other Services       Precautions / Restrictions Precautions Precautions: Fall Restrictions Weight Bearing Restrictions: Yes LLE Weight Bearing: Non weight bearing    Mobility  Bed Mobility               General bed mobility comments: pt up in chair upon PT arrival  Transfers Overall transfer level: Needs assistance Equipment used: Rolling walker (2 wheeled) Transfers: Sit to/from Stand Sit to Stand: Min assist Stand pivot transfers: Min assist       General transfer comment: minA to steady patient, max directional verbal cues, pt very anxious re: falling, pt assist std pvt to [facility] and then back to chair  Ambulation/Gait Ambulation/Gait assistance: Min assist Gait Distance (Feet): 5 Feet Assistive device: Rolling walker (2 wheeled) Gait Pattern/deviations: Step-to pattern Gait velocity: dec Gait velocity interpretation: <1.8 ft/sec, indicate of risk for recurrent falls General Gait Details: pt able to clear foot, max directional verbal cues as pt trying to hop quick and fast, minA for walker mangement/sequencing, pt  TTWB on L LE x 3 times but no overt Production assistant, radio    Modified Rankin (Stroke Patients Only)       Balance           Standing balance support: Bilateral upper extremity supported Standing balance-Leahy Scale: Poor                              Cognition Arousal/Alertness: Awake/alert Behavior During Therapy: WFL for tasks assessed/performed Overall Cognitive Status: Within Functional Limits for tasks assessed                                        Exercises General Exercises - Lower Extremity Ankle Circles/Pumps: (wiggled L toes) Gluteal Sets: AROM;Both;10 reps;Seated Straight Leg Raises: AROM;Left;5 reps;Seated    General Comments General comments (skin integrity, edema, etc.): discussed vertigo and evaluating and treating it today. Pt declined as she hasn't felt dizzy today and didn't feel comfortable with the positions i would have to put her in to complete assessment and/or treatment. discussed following up at SNF or out patient once L LE had healed      Pertinent Vitals/Pain Pain Assessment: 0-10 Pain Score: 8  Pain Location: L ankle Pain Descriptors / Indicators: Throbbing Pain Intervention(s): Monitored during session    Home Living  Prior Function            PT Goals (current goals can now be found in the care plan section) Progress towards PT goals: Progressing toward goals    Frequency    Min 3X/week      PT Plan Current plan remains appropriate    Co-evaluation              AM-PAC PT "6 Clicks" Mobility   Outcome Measure  Help needed turning from your back to your side while in a flat bed without using bedrails?: A Little Help needed moving from lying on your back to sitting on the side of a flat bed without using bedrails?: A Little Help needed moving to and from a bed to a chair (including a wheelchair)?: A Little Help needed  standing up from a chair using your arms (e.g., wheelchair or bedside chair)?: A Little Help needed to walk in hospital room?: A Lot Help needed climbing 3-5 steps with a railing? : Total 6 Click Score: 15    End of Session Equipment Utilized During Treatment: Gait belt Activity Tolerance: Patient tolerated treatment well Patient left: in chair;with call bell/phone within reach;with chair alarm set;with family/visitor present Nurse Communication: Mobility status PT Visit Diagnosis: Other abnormalities of gait and mobility (R26.89);Muscle weakness (generalized) (M62.81);History of falling (Z91.81);Repeated falls (R29.6);Difficulty in walking, not elsewhere classified (R26.2);Dizziness and giddiness (R42)     Time: 1219-7588 PT Time Calculation (min) (ACUTE ONLY): 21 min  Charges:  $Therapeutic Activity: 8-22 mins                     Kittie Plater, PT, DPT Acute Rehabilitation Services Pager #: [phone] Office #: [phone]    Berline Lopes 09/15/2018, 3:10 PM

## 2018-09-16 NOTE — Plan of Care (Signed)
  Problem: Health Behavior/Discharge Planning: Goal: Ability to manage health-related needs will improve Outcome: Progressing   Problem: Clinical Measurements: Goal: Will remain free from infection Outcome: Progressing   Problem: Activity: Goal: Risk for activity intolerance will decrease Outcome: Progressing   Problem: Nutrition: Goal: Adequate nutrition will be maintained Outcome: Progressing   Problem: Elimination: Goal: Will not experience complications related to bowel motility Outcome: Progressing   Problem: Pain Managment: Goal: General experience of comfort will improve Outcome: Progressing   Problem: Safety: Goal: Ability to remain free from injury will improve Outcome: Progressing

## 2018-09-16 NOTE — Evaluation (Signed)
Occupational Therapy Evaluation Patient Details Name: Stacey Alvarez MRN: [id number] DOB: 23-Mar-1956 Today's Date: 09/16/2018    History of Present Illness 63 y.o. female with a history of interstitial lung disease, GERD, hypertension, depression and anxiety, recently had a fall and suffered a left ankle fracture. She presented with worsening swelling/pain related to her recent fracture and underwent ORIF of her left ankle 09/13/18   Clinical Impression   Patient is s/p see above surgery resulting in functional limitations due to the deficits listed below (see OT problem list). Pt currently min (A) for basic transfer and needs (A) for steady with peri hygiene. Pt with decr support at home and requires rehab for d/c.  Patient will benefit from skilled OT acutely to increase independence and safety with ADLS to allow discharge SNF.      Follow Up Recommendations  SNF    Equipment Recommendations  3 in 1 bedside commode;Hospital bed    Recommendations for Other Services       Precautions / Restrictions Precautions Precautions: Fall Precaution Comments: hx Vertigo Restrictions Weight Bearing Restrictions: Yes LLE Weight Bearing: Non weight bearing      Mobility Bed Mobility Overal bed mobility: Modified Independent                Transfers Overall transfer level: Needs assistance Equipment used: Rolling walker (2 wheeled) Transfers: Sit to/from Stand Sit to Stand: Min assist Stand pivot transfers: Min assist       General transfer comment: pt able to tolerate use of RW to [facility]    Balance Overall balance assessment: Needs assistance Sitting-balance support: No upper extremity supported;Feet supported Sitting balance-Leahy Scale: Fair     Standing balance support: Bilateral upper extremity supported Standing balance-Leahy Scale: Poor                             ADL either performed or assessed with clinical judgement   ADL Overall ADL's : Needs  assistance/impaired Eating/Feeding: Independent   Grooming: Wash/dry hands   Upper Body Bathing: Minimal assistance   Lower Body Bathing: Moderate assistance;Sit to/from stand   Upper Body Dressing : Minimal assistance   Lower Body Dressing: Maximal assistance   Toilet Transfer: Minimal assistance;Stand-pivot;BSC   Toileting- Clothing Manipulation and Hygiene: Minimal assistance;Sit to/from stand Toileting - Clothing Manipulation Details (indicate cue type and reason): requires standing position       General ADL Comments: pt completed OOB to [facility] and then to chair      Vision Baseline Vision/History: Wears glasses       Perception     Praxis      Pertinent Vitals/Pain Pain Assessment: 0-10 Pain Score: 5  Pain Location: L ankle Pain Descriptors / Indicators: Throbbing Pain Intervention(s): Monitored during session;Premedicated before session;Repositioned     Hand Dominance Right   Extremity/Trunk Assessment Upper Extremity Assessment Upper Extremity Assessment: Overall WFL for tasks assessed   Lower Extremity Assessment Lower Extremity Assessment: Defer to PT evaluation   Cervical / Trunk Assessment Cervical / Trunk Assessment: Normal   Communication Communication Communication: No difficulties   Cognition Arousal/Alertness: Awake/alert Behavior During Therapy: WFL for tasks assessed/performed Overall Cognitive Status: Within Functional Limits for tasks assessed                                     General Comments       Exercises  General Exercises - Lower Extremity Ankle Circles/Pumps: (wiggled L toes)   Shoulder Instructions      Home Living Family/patient expects to be discharged to:: Private residence Living Arrangements: Alone Available Help at Discharge: Available PRN/intermittently;Family;Friend(s) Type of Home: House Home Access: Stairs to enter [employer] of Steps: 2 Entrance Stairs-Rails: None Home Layout:  One level;Able to live on main level with bedroom/bathroom     Bathroom Shower/Tub: Walk-in shower(with lip)   Bathroom Toilet: Handicapped height     Home Equipment: Bedside commode;Walker - 2 wheels;Shower seat;Other (comment)(knee scooter)   Additional Comments: children leave toward [facility]      Prior Functioning/Environment Level of Independence: Independent        Comments: having trouble with vertigo, limiting her from going out        OT Problem List: Decreased strength;Decreased activity tolerance;Impaired balance (sitting and/or standing);Decreased safety awareness;Decreased knowledge of use of DME or AE;Decreased knowledge of precautions;Obesity;Pain      OT Treatment/Interventions: Self-care/ADL training;Therapeutic exercise;Neuromuscular education;Energy conservation;DME and/or AE instruction;Manual therapy;Modalities;Splinting;Therapeutic activities;Patient/family education;Balance training    OT Goals(Current goals can be found in the care plan section) Acute Rehab OT Goals Patient Stated Goal: get strong enough to get around OT Goal Formulation: With patient Time For Goal Achievement: 09/30/18 Potential to Achieve Goals: Good  OT Frequency: Min 2X/week   Barriers to D/C: Decreased caregiver support          Co-evaluation              AM-PAC OT "6 Clicks" Daily Activity     Outcome Measure Help from another person eating meals?: None Help from another person taking care of personal grooming?: None Help from another person toileting, which includes using toliet, bedpan, or urinal?: A Little Help from another person bathing (including washing, rinsing, drying)?: A Lot Help from another person to put on and taking off regular upper body clothing?: A Little Help from another person to put on and taking off regular lower body clothing?: A Lot 6 Click Score: 18   End of Session Equipment Utilized During Treatment: Gait belt;Rolling walker Nurse  Communication: Mobility status;Precautions  Activity Tolerance: Patient tolerated treatment well Patient left: in chair;with call bell/phone within reach  OT Visit Diagnosis: Unsteadiness on feet (R26.81);Muscle weakness (generalized) (M62.81)                Time: 6433-2951 OT Time Calculation (min): 23 min Charges:  OT General Charges $OT Visit: 1 Visit OT Evaluation $OT Eval Moderate Complexity: 1 Mod   Jeri Modena, OTR/L  Acute Rehabilitation Services Pager: [phone] Office: [phone] .   Jeri Modena 09/16/2018, 3:35 PM

## 2018-09-16 NOTE — Op Note (Signed)
Treniece Holsclaw female 63 y.o. 09/16/2018  PreOperative Diagnosis: Left bimalleolar equivalent ankle fracture with syndesmotic disruption  PostOperative Diagnosis: Same   Procedure(s) and Anesthesia Type:    * OPEN REDUCTION INTERNAL FIXATION (ORIF) ANKLE FRACTURE - General Open treatment of syndesmosis  Surgeon: Erle Crocker   Assistants: None  Anesthesia: General LMA anesthesia  Findings: Left bimalleolar equivalent ankle fracture dislocation with syndesmotic disruption  Implants: Synthes small fragment set  Indications:62 y.o. female fell at home and sustained the above injury.  She has a history of interstitial lung disease, morbid obesity and pulmonary embolism.  She is no longer on anticoagulation.  She was seen initially at the emergency department where reduction was complete and she was placed in a short leg splint.  She was sent home from the emergency department.  However given her obesity and difficulty with mobilizing she was a significant fall risk and therefore she presented to the emergency department with increased leg swelling, shortness of breath and pain issues.  She was admitted to the medicine team and orthopedics was consulted.  On my evaluation the patient continued to have pain in her left ankle.  Her splint was intact.  Given her mobility issues and comorbidities she was indicated for open treatment of her fracture.  After lengthy discussion about the risk, benefits alternatives of surgery she opted to proceed.  The risk discussed included but were not limited to wound healing complications, infection, nonunion, malunion, continued pain, development of arthritis, damage surrounding structures, need for second surgery, perioperative and anesthetic risks which include death.  After weighing these risks he opted to proceed.  Procedure Detail: Patient was identified in the preoperative holding area the left leg was marked by myself.  The consent form was signed  by myself and the patient.  A peripheral nerve block was performed by anesthesia.  She was taken the operative suite and moved to the operative table in the supine position.  General LMA anesthesia was induced without difficulty.  A thigh tourniquet was placed on the left thigh.  A bump was placed on the left hip.  All bony prominences well-padded.  Preoperative antibiotics were given.  A surgical timeout was performed.  The left lower extremity was prepped and draped in usual sterile fashion.  Bone foam was used.  The thigh tourniquet was elevated to 250 mmHg.  I began by making a longitudinal incision along the distal fibula overlying the fracture site.  This taken sharply down through skin skin and subcutaneous tissue.  Blunt dissection was used to identify any branches of the superficial peroneal nerve which was not found in the surgical field.  Then once the fracture site was identified the sides of the fracture were cleaned sharply of any periosteal tissue and the fracture site was opened up and curetted out and cleaned with a rondure directly.  The fracture site was at the level of the syndesmosis and there was obvious disruption of the syndesmotic ligaments.  Is able to visualize within the ankle joint and some small bony fragments were removed.  The ankle joint was irrigated.  The fracture was then reduced with a pointed reduction clamp and held in place provisionally.  Given the transverse nature of the fracture I was unable to place a lag screw.  Then a one third tubular plate was placed for definitive fixation.  Appropriate reduction was confirmed on fluoroscopy.  Then the ankle joint was stressed using fluoroscopy and there was widening of the syndesmosis.  Through  direct visualization is able to reduce the syndesmosis and clamp it with a Weber clamp.  Then 2 syndesmotic screws were placed.  After this the ankle was stable to stress view.  The wound was irrigated out and the thigh tourniquet was  released.  Hemostasis was obtained.  Deep tissue was closed with 2-0 PDS.  Subcuticular tissue was closed with 3-0 Monocryl and 2-0 nylon was used for skin.  She was awakened from anesthesia moved over to the hospital bed.  She was taken to recovery room in stable condition.  The counts were correct at the end the case.  There were no complications.  Post Op Instructions: Nonweightbearing to left lower extremity Keep splint clean and dry She will be on Lovenox for DVT prophylaxis given her history of PE She will likely need SNF placement after mobilizing with physical therapy She will follow-up with me in 2 weeks for splint removal, cast placement and x-rays.   Estimated Blood Loss:  less than 50 mL         Drains: none  Blood Given: none         Specimens: none       Complications:  * No complications entered in OR log *         Disposition: PACU - hemodynamically stable.         Condition: stable

## 2018-09-16 NOTE — Plan of Care (Signed)
  Problem: Pain Managment: Goal: General experience of comfort will improve Outcome: Progressing   Problem: Safety: Goal: Ability to remain free from injury will improve Outcome: Progressing   Problem: Skin Integrity: Goal: Risk for impaired skin integrity will decrease Outcome: Progressing   

## 2018-09-16 NOTE — Progress Notes (Signed)
Subjective: 3 Days Post-Op Procedure(s) (LRB): OPEN REDUCTION INTERNAL FIXATION (ORIF) ANKLE FRACTURE (Left)  Pain is improved since yesterday.  She has no complaints today.  Denies any shortness of breath.  Is anticipating SNF placement.  Objective: Vital signs in last 24 hours: Temp:  [97.7 F (36.5 C)-98.5 F (36.9 C)] 97.7 F (36.5 C) (01/01 0522) Pulse Rate:  [75-82] 75 (01/01 0522) Resp:  [16] 16 (12/31 1514) BP: (130-173)/(65-81) 173/81 (01/01 0522) SpO2:  [96 %-98 %] 98 % (01/01 0522)  She is awake and alert.  Left lower extremity in a short leg splint.  Toes exposed are warm and well-perfused.  She has sensation to light touch over the toes.  She is able to wiggle her toes.  Foot is warm and well-perfused.  No tenderness palpation proximal to the splint.   Assessment/Plan: 3 Days Post-Op Procedure(s) (LRB): OPEN REDUCTION INTERNAL FIXATION (ORIF) ANKLE FRACTURE (Left)  Patient is doing well.  She will continue to mobilize with physical therapy while here.  She will remain nonweightbearing on the left lower extremity.  Anticipate SNF discharge within the next couple of days.  She will need formal DVT prophylaxis upon discharge in the form of Lovenox.  She will follow-up in 2 weeks for cast placement, wound check and x-rays.    Erle Crocker 09/16/2018, 9:28 AM

## 2018-09-16 NOTE — Progress Notes (Signed)
PROGRESS NOTE    Stacey Alvarez  WUJ:[id number] DOB: 21-Nov-1955 DOA: 09/12/2018 PCP: Midge Minium, MD   Brief Narrative: Stacey Alvarez is a 63 y.o. female with a history of interstitial lung disease, GERD, hypertension, depression and anxiety, recently had a fall and suffered a left ankle fracture. She presented with worsening swelling/pain related to her recent fracture and underwent ORIF of her left ankle.   Assessment & Plan:   Principal Problem:   Closed left ankle fracture Active Problems:   HTN (hypertension)   GERD (gastroesophageal reflux disease)   Depression with anxiety   Hyperlipidemia   ILD (interstitial lung disease) (HCC)   Vertigo   Closed ankle fracture Suffered after fall. S/p ORIF on 12/29. Pain control. PT pending -Orthopedic surgery recommendations: NWB of left leg -PT recommendations: SNF, social work consulted -OT eval for upper body strength exercises pending  Essential hypertension Controlled -Continue amlodipine and lisinopril  GERD -Continue Protonix  Interstitial lung disease Seen by pulmonology at [facility]. Currently on CellCept and prednisone. -Continue home prednisone 2.5 mg (confirmed with patient's pharmacy on 12/30)  History of vertigo Patient is scheduled to follow-up with neurology in February  Hyperlipidemia -Continue fenofibrate  Depression/anxiety -Continue Prozac, lamictal and Xanax (home regimen ordered; verified on [place] database and patient's Rx bottle).  Insomnia -Continue Restoril  DVT prophylaxis: Per orthopedic surgery Code Status:   Code Status: Full Code Family Communication: Daughter at bedside Disposition Plan: Discharge likely to SNF pending PT recommendations   Consultants:   Orthopedic surgery  Procedures:   12/29: ORIF  Antimicrobials:  None    Subjective: Pain better controlled today. No other issues noted.  Objective: Vitals:   09/15/18 0401 09/15/18 1514 09/15/18 2100 09/16/18 0522    BP: (!) 141/70 130/65 133/68 (!) 173/81  Pulse: 74 82 75 75  Resp: 16 16    Temp: 98.2 F (36.8 C) 98.5 F (36.9 C) 98.2 F (36.8 C) 97.7 F (36.5 C)  TempSrc: Oral Oral Oral Oral  SpO2: 96% 96% 97% 98%  Weight:      Height:       No intake or output data in the 24 hours ending 09/16/18 0923 Filed Weights   09/12/18 2120  Weight: 108.9 kg    Examination:  General: Well appearing, no distress    Data Reviewed: I have personally reviewed following labs and imaging studies  CBC: Recent Labs  Lab 09/12/18 2130 09/12/18 2143 09/13/18 0255 09/15/18 0808  WBC 9.9  --  8.4 7.6  NEUTROABS 7.1  --   --   --   HGB 10.8* 11.6* 9.9* 10.7*  HCT 35.2* 34.0* 31.6* 34.0*  MCV 85.9  --  86.6 87.2  PLT 286  --  263 956   Basic Metabolic Panel: Recent Labs  Lab 09/12/18 2130 09/12/18 2143 09/13/18 0255  NA 139 137 138  K 3.9 3.8 3.6  CL 103 103 103  CO2 25  --  25  GLUCOSE 115* 109* 114*  BUN 13 14 11   CREATININE 0.90 0.90 0.75  CALCIUM 9.3  --  8.6*   GFR: Estimated Creatinine Clearance: 85.7 mL/min (by C-G formula based on SCr of 0.75 mg/dL). Liver Function Tests: Recent Labs  Lab 09/12/18 2130  AST 12*  ALT 12  ALKPHOS 43  BILITOT 0.3  PROT 6.3*  ALBUMIN 3.5   No results for input(s): LIPASE, AMYLASE in the last 168 hours. No results for input(s): AMMONIA in the last 168 hours. Coagulation Profile: Recent Labs  Lab 09/12/18 2130  INR 1.07   Cardiac Enzymes: Recent Labs  Lab 09/13/18 0255  CKTOTAL 33*   BNP (last 3 results) No results for input(s): PROBNP in the last 8760 hours. HbA1C: No results for input(s): HGBA1C in the last 72 hours. CBG: No results for input(s): GLUCAP in the last 168 hours. Lipid Profile: No results for input(s): CHOL, HDL, LDLCALC, TRIG, CHOLHDL, LDLDIRECT in the last 72 hours. Thyroid Function Tests: No results for input(s): TSH, T4TOTAL, FREET4, T3FREE, THYROIDAB in the last 72 hours. Anemia Panel: No results for  input(s): VITAMINB12, FOLATE, FERRITIN, TIBC, IRON, RETICCTPCT in the last 72 hours. Sepsis Labs: No results for input(s): PROCALCITON, LATICACIDVEN in the last 168 hours.  Recent Results (from the past 240 hour(s))  Respiratory Panel by PCR     Status: None   Collection Time: 09/13/18  3:13 AM  Result Value Ref Range Status   Adenovirus NOT DETECTED NOT DETECTED Final   Coronavirus 229E NOT DETECTED NOT DETECTED Final   Coronavirus HKU1 NOT DETECTED NOT DETECTED Final   Coronavirus NL63 NOT DETECTED NOT DETECTED Final   Coronavirus OC43 NOT DETECTED NOT DETECTED Final   Metapneumovirus NOT DETECTED NOT DETECTED Final   Rhinovirus / Enterovirus NOT DETECTED NOT DETECTED Final   Influenza A NOT DETECTED NOT DETECTED Final   Influenza B NOT DETECTED NOT DETECTED Final   Parainfluenza Virus 1 NOT DETECTED NOT DETECTED Final   Parainfluenza Virus 2 NOT DETECTED NOT DETECTED Final   Parainfluenza Virus 3 NOT DETECTED NOT DETECTED Final   Parainfluenza Virus 4 NOT DETECTED NOT DETECTED Final   Respiratory Syncytial Virus NOT DETECTED NOT DETECTED Final   Bordetella pertussis NOT DETECTED NOT DETECTED Final   Chlamydophila pneumoniae NOT DETECTED NOT DETECTED Final   Mycoplasma pneumoniae NOT DETECTED NOT DETECTED Final         Radiology Studies: Vas [place] Lower Extremity Venous (dvt)  Result Date: 09/14/2018  Lower Venous Study Indications: Swelling, and left ankle fracture/ calf and foot in cast.  Performing Technologist: June Leap RDMS, RVT  Examination Guidelines: A complete evaluation includes B-mode imaging, spectral Doppler, color Doppler, and power Doppler as needed of all accessible portions of each vessel. Bilateral testing is considered an integral part of a complete examination. Limited examinations for reoccurring indications may be performed as noted.  Left Venous Findings: +---------+---------------+---------+-----------+----------+-------+           CompressibilityPhasicitySpontaneityPropertiesSummary +---------+---------------+---------+-----------+----------+-------+ CFV      Full           Yes      Yes                          +---------+---------------+---------+-----------+----------+-------+ SFJ      Full                                                 +---------+---------------+---------+-----------+----------+-------+ FV Prox  Full                                                 +---------+---------------+---------+-----------+----------+-------+ FV Mid   Full                                                 +---------+---------------+---------+-----------+----------+-------+  FV DistalFull                                                 +---------+---------------+---------+-----------+----------+-------+ PFV      Full                                                 +---------+---------------+---------+-----------+----------+-------+ POP      Full           Yes      Yes                          +---------+---------------+---------+-----------+----------+-------+ left calf unable to image due to cast    Summary: Left: There is no evidence of deep vein thrombosis in the lower extremity. However, portions of this examination were limited- see technologist comments above. No cystic structure found in the popliteal fossa.  *See table(s) above for measurements and observations. Electronically signed by Deitra Mayo MD on 09/14/2018 at 5:09:11 PM.    Final         Scheduled Meds: . ALPRAZolam  2 mg Oral QHS   And  . ALPRAZolam  1 mg Oral Daily  . amLODipine  2.5 mg Oral Daily  . amLODipine  5 mg Oral QHS  . docusate sodium  100 mg Oral BID  . fenofibrate  160 mg Oral Daily  . FLUoxetine  60 mg Oral Daily  . fluticasone  1 spray Each Nare Daily  . heparin  5,000 Units Subcutaneous Q8H  . lamoTRIgine  100 mg Oral Daily  . lisinopril  20 mg Oral BID  . loratadine  10 mg Oral Daily  .  mycophenolate  500 mg Oral BID  . pantoprazole  40 mg Oral BID  . pravastatin  40 mg Oral q1800  . predniSONE  2.5 mg Oral Q breakfast  . temazepam  30 mg Oral QHS   Continuous Infusions: . lactated ringers 10 mL/hr at 09/14/18 0200     LOS: 3 days     Cordelia Poche, MD [facility] 09/16/2018, 9:23 AM  If 7PM-7AM, please contact night-coverage [email or web address]

## 2018-09-17 MED ORDER — ENOXAPARIN SODIUM 40 MG/0.4ML ~~LOC~~ SOLN: 40.0000 mg | SUBCUTANEOUS | 0 refills | Status: DC

## 2018-09-17 NOTE — Plan of Care (Signed)
  Problem: Coping: Goal: Level of anxiety will decrease Outcome: Progressing   Problem: Clinical Measurements: Goal: Will remain free from infection Outcome: Progressing Goal: Cardiovascular complication will be avoided Outcome: Progressing   Problem: Activity: Goal: Risk for activity intolerance will decrease Outcome: Progressing

## 2018-09-17 NOTE — Plan of Care (Signed)

## 2018-09-17 NOTE — Discharge Summary (Signed)
Physician Discharge Summary  Stacey Alvarez SWF:[id number] DOB: [birth year] DOA: 09/12/2018  PCP: Midge Minium, MD  Admit date: 09/12/2018 Discharge date: 09/17/2018  Admitted From: Home Disposition:  SNF   Recommendations for Outpatient Follow-up:  Nonweightbearing to left lower extremity Keep splint clean and dry Lovenox prescribed for 2 weeks for DVT ppx. Further refills per orthopedic surgery  She will follow-up with Dr. Lucia Gaskins in 2 weeks for splint removal, cast placement and x-rays.  Discharge Condition: Stable CODE STATUS: Full  Diet recommendation: Heart healthy   Brief/Interim Summary: HPI per Dr. Blaine Hamper: Stacey Alvarez is a 63 y.o. female with medical history significant of hypertension, hyperlipidemia, interstitial lung disease on 2 L nasal cannula oxygen as needed, GERD, depression with anxiety, fibromyalgia, OSA on CPAP, obesity, vertigo, who presents with left foot and left lower leg pain.  Patient states that because of vertigo, she fell 2 days ago, injured her left foot and ankle.  She was seen in the ED, and found to have laterally displaced fracture of the distal fibula, with lateral dislocation of the talus. Fracture dislocation was reduced and splint applied. She is referred to orthopedics for elective open reduction and internal fixation.   Today patient states that she has worsening pain in the left foot and lower leg.  She also has worsening edema in the left lower leg.  The pain is constant, 10 out of 10 severity, sharp, nonradiating.  Patient states that the pain is intolerable.  She cannot put any weight on the leg.  She states that when she has worsening pain, her shortness of breath is also worse than the baseline.  She has dry cough, but no fever or chills.  She had chest discomfort earlier, which has resolved.  Currently patient does not have any chest pain.  Patient has nausea, no vomiting, diarrhea or abdominal pain pain no symptoms of UTI or unilateral  weakness.  ED Course: pt was found to have WBC 9.9, INR 1.07, PTT 33, negative troponin, BNP 30, electrolytes renal function okay, no fever, heart rate in 90s, oxygen saturation 95% on room air.  Chest x-ray negative.  CT Ang of chest was negative for PE, but showed mild patchy groundglass in bilateral upper lobe.  Patient is admitted to [facility] bed as inpatient.  Orthopedic surgeon, Dr. Lucia Gaskins was consulted.  Interim: Patient underwent open reduction internal fixation of left ankle fracture on 12/29 with Dr. Lucia Gaskins.  She is discharged to skilled nursing facility   Discharge Diagnoses:  Principal Problem:   Closed left ankle fracture Active Problems:   HTN (hypertension)   GERD (gastroesophageal reflux disease)   Depression with anxiety   Hyperlipidemia   ILD (interstitial lung disease) (HCC)   Vertigo  Closed ankle fracture Suffered after fall. S/p ORIF on 12/29. Lovenox prescribed for 2 weeks for DVT ppx. Further refills per orthopedic surgery   Essential hypertension Controlled. Continue amlodipine and lisinopril  GERD Continue Protonix  Interstitial lung disease Seen by pulmonology at [facility]. Currently on CellCept and prednisone. Continue home prednisone 2.5 mg  History of vertigo Patient is scheduled to follow-up with neurology in February  Hyperlipidemia Continue fenofibrate  Depression/anxiety Continue Prozac, lamictal and Xanax   Insomnia Continue Restoril  Discharge Instructions  Discharge Instructions    Diet - low sodium heart healthy   Complete by:  As directed    Increase activity slowly   Complete by:  As directed      Allergies as of 09/17/2018  Reactions   Macrobid [nitrofurantoin Monohyd Macro] Diarrhea   Diarrhea, GI upset   Sulfa Antibiotics Rash      Medication List    STOP taking these medications   oxyCODONE-acetaminophen 5-325 MG tablet Commonly known as:  PERCOCET     TAKE these medications   albuterol 108 (90 Base)  MCG/ACT inhaler Commonly known as:  PROVENTIL HFA;VENTOLIN HFA Inhale 2 puffs into the lungs every 6 (six) hours as needed for wheezing or shortness of breath.   ALPRAZolam 1 MG tablet Commonly known as:  XANAX Take 0.5-2 mg by mouth See admin instructions. Take 1/2 tablet every morning and take 2 tablets at bedtime   amLODipine 5 MG tablet Commonly known as:  NORVASC TAKE 1/2 TABLET BY MOUTH IN THE MORNING AND 1 TABLET BY MOUTH AT BEDTIME What changed:    how much to take  how to take this  when to take this   butalbital-aspirin-caffeine-codeine 50-325-40-30 MG capsule Commonly known as:  FIORINAL WITH CODEINE TAKE 1 CAPSULE BY MOUTH EVERY 8 HOURS AS NEEDED FOR PAIN What changed:    how much to take  how to take this  when to take this  reasons to take this  additional instructions   cetirizine 10 MG tablet Commonly known as:  ZYRTEC Take 10 mg by mouth daily.   enoxaparin 40 MG/0.4ML injection Commonly known as:  LOVENOX Inject 0.4 mLs (40 mg total) into the skin daily for 14 days.   fenofibrate 160 MG tablet TAKE 1 TABLET(160 MG) BY MOUTH DAILY What changed:  See the new instructions.   FLONASE 50 MCG/ACT nasal spray Generic drug:  fluticasone Place 1 spray into both nostrils daily.   FLUoxetine 20 MG capsule Commonly known as:  PROZAC Take 60 mg by mouth daily.   lamoTRIgine 100 MG tablet Commonly known as:  LAMICTAL Take 100 mg by mouth daily.   lisinopril 20 MG tablet Commonly known as:  PRINIVIL,ZESTRIL TAKE 1 TABLET(20 MG) BY MOUTH TWICE DAILY What changed:  See the new instructions.   meclizine 25 MG tablet Commonly known as:  ANTIVERT Take 1 tablet (25 mg total) by mouth 3 (three) times daily as needed for dizziness.   mycophenolate 500 MG tablet Commonly known as:  CELLCEPT Take 500 mg by mouth 2 (two) times daily.   oxyCODONE 5 MG immediate release tablet Commonly known as:  ROXICODONE Take 1 tablet (5 mg total) by mouth every 4  (four) hours as needed for up to 7 days.   OXYGEN Inhale 2 L into the lungs as needed (SOB).   pantoprazole 40 MG tablet Commonly known as:  PROTONIX Take 1 tablet (40 mg total) by mouth 2 (two) times daily.   pravastatin 40 MG tablet Commonly known as:  PRAVACHOL Take 1 tablet (40 mg total) by mouth daily.   predniSONE 10 MG tablet Commonly known as:  DELTASONE Take 2.5 mg by mouth daily with breakfast.   promethazine 25 MG tablet Commonly known as:  PHENERGAN Take 1 tablet (25 mg total) by mouth every 8 (eight) hours as needed for nausea or vomiting.   temazepam 30 MG capsule Commonly known as:  RESTORIL Take 30 mg by mouth at bedtime.      Follow-up Information    Erle Crocker, MD In 2 weeks.   Specialty:  Orthopedic Surgery Contact information: [place] [address] [phone]          Allergies  Allergen Reactions  . Macrobid [employer]  Monohyd Macro] Diarrhea    Diarrhea, GI upset  . Sulfa Antibiotics Rash    Consultations:  Orthopedic surgery   Procedures/Studies: Dg Ankle Complete Left  Result Date: 09/13/2018 CLINICAL DATA:  Left ankle fracture dislocation. EXAM: DG C-ARM 61-120 MIN; LEFT ANKLE COMPLETE - 3+ VIEW COMPARISON:  Radiographs dated 09/11/2018 FINDINGS: Three C-arm images demonstrate the patient has undergone open reduction and internal fixation of the fracture dislocation at the left ankle. Hardware appears in good position. Dislocation has been completely reduced. Alignment is essentially anatomic. IMPRESSION: Open reduction and internal fixation of fracture dislocation of the left ankle. FLUOROSCOPY TIME:  34 seconds C-arm fluoroscopic images were obtained intraoperatively and submitted for post operative interpretation. Electronically Signed   By: Lorriane Shire M.D.   On: 09/13/2018 15:29   Dg Ankle Complete Left  Result Date: 09/11/2018 CLINICAL DATA:  Status post 3 falls, with left ankle pain, swelling  and bruising. Obvious left ankle deformity. Initial encounter. EXAM: LEFT ANKLE COMPLETE - 3+ VIEW COMPARISON:  None. FINDINGS: There is a laterally displaced fracture of the distal fibula, with lateral dislocation of the talus. No definite medial malleolar or posterior malleolar fracture is seen. Scattered accessory ossicles are noted at the distal Achilles tendon. A plantar calcaneal spur is seen. The subtalar joint is grossly unremarkable. Diffuse soft tissue swelling is noted about the ankle. IMPRESSION: Laterally displaced fracture of the distal fibula, with lateral dislocation of the talus. Electronically Signed   By: Garald Balding M.D.   On: 09/11/2018 04:01   Ct Angio Chest Pe W And/or Wo Contrast  Result Date: 09/12/2018 CLINICAL DATA:  Shortness of breath.  History of prior DVTs. EXAM: CT ANGIOGRAPHY CHEST WITH CONTRAST TECHNIQUE: Multidetector CT imaging of the chest was performed using the standard protocol during bolus administration of intravenous contrast. Multiplanar CT image reconstructions and MIPs were obtained to evaluate the vascular anatomy. CONTRAST:  174mL ISOVUE-370 IOPAMIDOL (ISOVUE-370) INJECTION 76% COMPARISON:  June 10, 2018 FINDINGS: Cardiovascular: Satisfactory opacification of the pulmonary arteries to the segmental level. No evidence of pulmonary embolism. The heart size is mildly enlarged. No pericardial effusion. Mediastinum/Nodes: No enlarged mediastinal, hilar, or axillary lymph nodes. Thyroid gland, trachea, and esophagus demonstrate no significant findings. Lungs/Pleura: Patchy ground-glass opacities are identified in bilateral upper lobes slightly more prominent compared to prior CT. There is no pleural effusion. Mild dependent atelectasis of posterior lung bases are noted. Upper Abdomen: No acute abnormality. Musculoskeletal: Mild degenerative joint changes of the spine are noted. Review of the MIP images confirms the above findings. IMPRESSION: No pulmonary  embolus. Mild patchy ground-glass opacities are identified in bilateral upper lobes slightly more prominent compared to prior exam. This is nonspecific. Findings can be seen in some degree of pulmonary edema or infectious/inflammatory etiology. Electronically Signed   By: Abelardo Diesel M.D.   On: 09/12/2018 23:25   Dg Chest Port 1 View  Result Date: 09/12/2018 CLINICAL DATA:  Shortness of breath and chest pain starting yesterday. EXAM: PORTABLE CHEST 1 VIEW COMPARISON:  None. FINDINGS: The heart size and mediastinal contours are within normal limits. There is no focal infiltrate, pulmonary edema, or pleural effusion. The visualized skeletal structures are stable. IMPRESSION: No active cardiopulmonary disease. Electronically Signed   By: Abelardo Diesel M.D.   On: 09/12/2018 22:14   Dg Ankle Left Port  Result Date: 09/11/2018 CLINICAL DATA:  Status post reduction of left ankle fracture/dislocation. Initial encounter. EXAM: PORTABLE LEFT ANKLE - 2 VIEW COMPARISON:  Left ankle radiographs performed  earlier today at 3:34 a.m. FINDINGS: There has been reduction of the talar dislocation. There is mild residual lateral displacement of the distal fibular fracture, and mild residual medial widening of the ankle mortise. Residual apparent mild misalignment of the anterior facet of the subtalar joint may reflect underlying ligamentous injury. No new fractures are seen. A plantar calcaneal spur is noted. The soft tissues are difficult to fully assess due to the overlying splint. IMPRESSION: 1. Successful reduction of talar dislocation. Mild residual lateral displacement of the distal fibular fracture, and mild residual medial widening of the ankle mortise. 2. Residual apparent mild misalignment of the anterior facet of the subtalar joint may reflect underlying ligamentous injury. Electronically Signed   By: Garald Balding M.D.   On: 09/11/2018 06:15   Dg Foot Complete Left  Result Date: 09/11/2018 CLINICAL DATA:   Status post fall, with left ankle pain, swelling, bruising and deformity. Initial encounter. EXAM: LEFT FOOT - COMPLETE 3+ VIEW COMPARISON:  None. FINDINGS: There is lateral displacement of a distal fibular fracture, and lateral anterior dislocation of the talus. Diffuse surrounding soft tissue swelling is noted. There appears to be misalignment of the calcaneus and cuboid at the anterior facet of the subtalar joint; this may reflect underlying ligamentous injury, but is difficult to fully characterize. Accessory ossicles are noted at the distal Achilles tendon. A small plantar calcaneal spur is seen. The subtalar joint is grossly unremarkable. IMPRESSION: 1. Lateral displacement of a distal fibular fracture, and lateral anterior dislocation of the talus. 2. Apparent misalignment of the calcaneus and cuboid at the anterior facet of the subtalar joint; this may reflect underlying ligamentous injury, but is difficult to fully characterize. Electronically Signed   By: Garald Balding M.D.   On: 09/11/2018 04:08   Dg C-arm 1-60 Min  Result Date: 09/13/2018 CLINICAL DATA:  Left ankle fracture dislocation. EXAM: DG C-ARM 61-120 MIN; LEFT ANKLE COMPLETE - 3+ VIEW COMPARISON:  Radiographs dated 09/11/2018 FINDINGS: Three C-arm images demonstrate the patient has undergone open reduction and internal fixation of the fracture dislocation at the left ankle. Hardware appears in good position. Dislocation has been completely reduced. Alignment is essentially anatomic. IMPRESSION: Open reduction and internal fixation of fracture dislocation of the left ankle. FLUOROSCOPY TIME:  34 seconds C-arm fluoroscopic images were obtained intraoperatively and submitted for post operative interpretation. Electronically Signed   By: Lorriane Shire M.D.   On: 09/13/2018 15:29   Vas [place] Lower Extremity Venous (dvt)  Result Date: 09/14/2018  Lower Venous Study Indications: Swelling, and left ankle fracture/ calf and foot in cast.   Performing Technologist: June Leap RDMS, RVT  Examination Guidelines: A complete evaluation includes B-mode imaging, spectral Doppler, color Doppler, and power Doppler as needed of all accessible portions of each vessel. Bilateral testing is considered an integral part of a complete examination. Limited examinations for reoccurring indications may be performed as noted.  Left Venous Findings: +---------+---------------+---------+-----------+----------+-------+          CompressibilityPhasicitySpontaneityPropertiesSummary +---------+---------------+---------+-----------+----------+-------+ CFV      Full           Yes      Yes                          +---------+---------------+---------+-----------+----------+-------+ SFJ      Full                                                 +---------+---------------+---------+-----------+----------+-------+  FV Prox  Full                                                 +---------+---------------+---------+-----------+----------+-------+ FV Mid   Full                                                 +---------+---------------+---------+-----------+----------+-------+ FV DistalFull                                                 +---------+---------------+---------+-----------+----------+-------+ PFV      Full                                                 +---------+---------------+---------+-----------+----------+-------+ POP      Full           Yes      Yes                          +---------+---------------+---------+-----------+----------+-------+ left calf unable to image due to cast    Summary: Left: There is no evidence of deep vein thrombosis in the lower extremity. However, portions of this examination were limited- see technologist comments above. No cystic structure found in the popliteal fossa.  *See table(s) above for measurements and observations. Electronically signed by Deitra Mayo MD on 09/14/2018  at 5:09:11 PM.    Final       Discharge Exam: Vitals:   09/16/18 2300 09/17/18 0437  BP:  139/81  Pulse: 95 87  Resp: 18 20  Temp:  98.5 F (36.9 C)  SpO2: 93% 94%    General: Pt is alert, awake, not in acute distress Cardiovascular: RRR, S1/S2 +, no rubs, no gallops Respiratory: CTA bilaterally, no wheezing, no rhonchi Abdominal: Soft, NT, ND, bowel sounds + Extremities: no edema, no cyanosis, left lower extremity in splint    The results of significant diagnostics from this hospitalization (including imaging, microbiology, ancillary and laboratory) are listed below for reference.     Microbiology: Recent Results (from the past 240 hour(s))  Respiratory Panel by PCR     Status: None   Collection Time: 09/13/18  3:13 AM  Result Value Ref Range Status   Adenovirus NOT DETECTED NOT DETECTED Final   Coronavirus 229E NOT DETECTED NOT DETECTED Final   Coronavirus HKU1 NOT DETECTED NOT DETECTED Final   Coronavirus NL63 NOT DETECTED NOT DETECTED Final   Coronavirus OC43 NOT DETECTED NOT DETECTED Final   Metapneumovirus NOT DETECTED NOT DETECTED Final   Rhinovirus / Enterovirus NOT DETECTED NOT DETECTED Final   Influenza A NOT DETECTED NOT DETECTED Final   Influenza B NOT DETECTED NOT DETECTED Final   Parainfluenza Virus 1 NOT DETECTED NOT DETECTED Final   Parainfluenza Virus 2 NOT DETECTED NOT DETECTED Final   Parainfluenza Virus 3 NOT DETECTED NOT DETECTED Final   Parainfluenza Virus 4 NOT DETECTED NOT DETECTED Final   Respiratory Syncytial Virus NOT DETECTED  NOT DETECTED Final   Bordetella pertussis NOT DETECTED NOT DETECTED Final   Chlamydophila pneumoniae NOT DETECTED NOT DETECTED Final   Mycoplasma pneumoniae NOT DETECTED NOT DETECTED Final     Labs: BNP (last 3 results) Recent Labs    08/10/18 1210 09/12/18 2130  BNP 19.5 02.5   Basic Metabolic Panel: Recent Labs  Lab 09/12/18 2130 09/12/18 2143 09/13/18 0255  NA 139 137 138  K 3.9 3.8 3.6  CL 103 103  103  CO2 25  --  25  GLUCOSE 115* 109* 114*  BUN 13 14 11   CREATININE 0.90 0.90 0.75  CALCIUM 9.3  --  8.6*   Liver Function Tests: Recent Labs  Lab 09/12/18 2130  AST 12*  ALT 12  ALKPHOS 43  BILITOT 0.3  PROT 6.3*  ALBUMIN 3.5   No results for input(s): LIPASE, AMYLASE in the last 168 hours. No results for input(s): AMMONIA in the last 168 hours. CBC: Recent Labs  Lab 09/12/18 2130 09/12/18 2143 09/13/18 0255 09/15/18 0808  WBC 9.9  --  8.4 7.6  NEUTROABS 7.1  --   --   --   HGB 10.8* 11.6* 9.9* 10.7*  HCT 35.2* 34.0* 31.6* 34.0*  MCV 85.9  --  86.6 87.2  PLT 286  --  263 316   Cardiac Enzymes: Recent Labs  Lab 09/13/18 0255  CKTOTAL 33*   BNP: Invalid input(s): POCBNP CBG: No results for input(s): GLUCAP in the last 168 hours. D-Dimer No results for input(s): DDIMER in the last 72 hours. Hgb A1c No results for input(s): HGBA1C in the last 72 hours. Lipid Profile No results for input(s): CHOL, HDL, LDLCALC, TRIG, CHOLHDL, LDLDIRECT in the last 72 hours. Thyroid function studies No results for input(s): TSH, T4TOTAL, T3FREE, THYROIDAB in the last 72 hours.  Invalid input(s): FREET3 Anemia work up No results for input(s): VITAMINB12, FOLATE, FERRITIN, TIBC, IRON, RETICCTPCT in the last 72 hours. Urinalysis    Component Value Date/Time   COLORURINE STRAW (A) 12/25/2017 1512   APPEARANCEUR CLEAR 12/25/2017 1512   LABSPEC 1.002 (L) 12/25/2017 1512   PHURINE 6.0 12/25/2017 1512   GLUCOSEU NEGATIVE 12/25/2017 1512   HGBUR SMALL (A) 12/25/2017 1512   BILIRUBINUR NEGATIVE 12/25/2017 1512   BILIRUBINUR neg 05/23/2016 1424   KETONESUR NEGATIVE 12/25/2017 1512   PROTEINUR NEGATIVE 12/25/2017 1512   UROBILINOGEN 0.2 05/23/2016 1424   UROBILINOGEN 0.2 04/14/2014 0410   NITRITE NEGATIVE 12/25/2017 1512   LEUKOCYTESUR MODERATE (A) 12/25/2017 1512   Sepsis Labs Invalid input(s): PROCALCITONIN,  WBC,  LACTICIDVEN Microbiology Recent Results (from the past  240 hour(s))  Respiratory Panel by PCR     Status: None   Collection Time: 09/13/18  3:13 AM  Result Value Ref Range Status   Adenovirus NOT DETECTED NOT DETECTED Final   Coronavirus 229E NOT DETECTED NOT DETECTED Final   Coronavirus HKU1 NOT DETECTED NOT DETECTED Final   Coronavirus NL63 NOT DETECTED NOT DETECTED Final   Coronavirus OC43 NOT DETECTED NOT DETECTED Final   Metapneumovirus NOT DETECTED NOT DETECTED Final   Rhinovirus / Enterovirus NOT DETECTED NOT DETECTED Final   Influenza A NOT DETECTED NOT DETECTED Final   Influenza B NOT DETECTED NOT DETECTED Final   Parainfluenza Virus 1 NOT DETECTED NOT DETECTED Final   Parainfluenza Virus 2 NOT DETECTED NOT DETECTED Final   Parainfluenza Virus 3 NOT DETECTED NOT DETECTED Final   Parainfluenza Virus 4 NOT DETECTED NOT DETECTED Final   Respiratory Syncytial Virus NOT DETECTED  NOT DETECTED Final   Bordetella pertussis NOT DETECTED NOT DETECTED Final   Chlamydophila pneumoniae NOT DETECTED NOT DETECTED Final   Mycoplasma pneumoniae NOT DETECTED NOT DETECTED Final     Patient was seen and examined on the day of discharge and was found to be in stable condition. Time coordinating discharge: 40 minutes including assessment and coordination of care, as well as examination of the patient.   SIGNED:  Dessa Phi, DO [facility] Pager [phone]  If 7PM-7AM, please contact night-coverage [email or web address] Password [email or web address] 09/17/2018, 2:03 PM

## 2018-09-17 NOTE — Progress Notes (Signed)
Patient will DC to: [facility] date: 09/17/2018 Family notified: [name] by: Corey Harold  Per MD patient ready for DC to [facility]. RN, patient, patient's family, and facility notified of DC. Discharge Summary sent to facility. RN given number for report [phone]. DC packet on chart. Ambulance transport requested for patient.  CSW signing off.  [place], [place]

## 2018-09-17 NOTE — Progress Notes (Signed)
Called facility, gave report to [place]. All questions and concerns addressed, Pt not in distress, daughter at bedside, discharged to facility via [facility] with belongings.

## 2018-09-17 NOTE — Progress Notes (Signed)
CSW reached out to [facility] for insurance update. They report insurance is still running, they will notify CSW as soon as we have auth. CSW informed them patient medically ready to dc awaiting insurance auth.   CSW will continue to follow up.   [place], [place]

## 2018-09-17 NOTE — Progress Notes (Signed)
Physical Therapy Treatment Patient Details Name: Stacey Alvarez MRN: [id number] DOB: [birth year] Today's Date: 09/17/2018    History of Present Illness 63 y.o. female with a history of interstitial lung disease, GERD, hypertension, depression and anxiety, recently had a fall and suffered a left ankle fracture. She presented with worsening swelling/pain related to her recent fracture and underwent ORIF of her left ankle 09/13/18    PT Comments    Patient seen for mobility progression. Pt is making progress toward PT goals and eager to mobilize. This session focused on functional transfers and gait training with RW and knee scooter. Pt requires min/mod A for all OOB mobility and presents with decreased activity tolerance and SOB with mobility. Continue to progress as tolerated with anticipated d/c to SNF for further skilled PT services.     Follow Up Recommendations  SNF     Equipment Recommendations  Other (comment)(TBD at next venue)    Recommendations for Other Services       Precautions / Restrictions Precautions Precautions: Fall Precaution Comments: hx Vertigo Restrictions Weight Bearing Restrictions: Yes LLE Weight Bearing: Non weight bearing    Mobility  Bed Mobility Overal bed mobility: Modified Independent             General bed mobility comments: use of rail and HOB elevated  Transfers Overall transfer level: Needs assistance Equipment used: Rolling walker (2 wheeled)(knee scooter) Transfers: Sit to/from Stand Sit to Stand: Min assist;Mod assist         General transfer comment: assist to power up into standing from elevated bed height and to steady when transfering from knee scooter to recliner; cues for sequencing and technique   Ambulation/Gait Ambulation/Gait assistance: Min assist;Mod assist Gait Distance (Feet): (a few steps with RW and then ~40 ft with knee scooter) Assistive device: Rolling walker (2 wheeled)(knee scooter) Gait Pattern/deviations:  Step-to pattern     General Gait Details: cues for posture and proximitiy to RW; assistance for balance and guiding knee scooter and cues for safety and navigating environment   Stairs             Wheelchair Mobility    Modified Rankin (Stroke Patients Only)       Balance Overall balance assessment: Needs assistance Sitting-balance support: No upper extremity supported;Feet supported Sitting balance-Leahy Scale: Good     Standing balance support: Bilateral upper extremity supported Standing balance-Leahy Scale: Poor                              Cognition Arousal/Alertness: Awake/alert Behavior During Therapy: WFL for tasks assessed/performed Overall Cognitive Status: Within Functional Limits for tasks assessed                                        Exercises General Exercises - Lower Extremity Straight Leg Raises: AROM;Left;5 reps;Seated Other Exercises Other Exercises: chair push ups     General Comments General comments (skin integrity, edema, etc.): SOB with mobility      Pertinent Vitals/Pain Pain Assessment: Faces Faces Pain Scale: Hurts little more Pain Location: L ankle Pain Descriptors / Indicators: Sore Pain Intervention(s): Monitored during session;Premedicated before session;Repositioned    Home Living                      Prior Function  PT Goals (current goals can now be found in the care plan section) Acute Rehab PT Goals Patient Stated Goal: get strong enough to get around Progress towards PT goals: Progressing toward goals    Frequency    Min 3X/week      PT Plan Current plan remains appropriate    Co-evaluation              AM-PAC PT "6 Clicks" Mobility   Outcome Measure  Help needed turning from your back to your side while in a flat bed without using bedrails?: A Little Help needed moving from lying on your back to sitting on the side of a flat bed without using  bedrails?: A Little Help needed moving to and from a bed to a chair (including a wheelchair)?: A Little Help needed standing up from a chair using your arms (e.g., wheelchair or bedside chair)?: A Little Help needed to walk in hospital room?: A Lot Help needed climbing 3-5 steps with a railing? : Total 6 Click Score: 15    End of Session Equipment Utilized During Treatment: Gait belt Activity Tolerance: Patient tolerated treatment well Patient left: in chair;with call bell/phone within reach;with family/visitor present Nurse Communication: Mobility status PT Visit Diagnosis: Other abnormalities of gait and mobility (R26.89);Muscle weakness (generalized) (M62.81);History of falling (Z91.81);Repeated falls (R29.6);Difficulty in walking, not elsewhere classified (R26.2);Dizziness and giddiness (R42)     Time: 0932-6712 PT Time Calculation (min) (ACUTE ONLY): 23 min  Charges:  $Gait Training: 8-22 mins $Therapeutic Activity: 8-22 mins                     Earney Navy, PTA Acute Rehabilitation Services Pager: [phone] Office: [phone]     Darliss Cheney 09/17/2018, 2:56 PM

## 2018-09-17 NOTE — Progress Notes (Signed)
Pt placed on CPAP for the night. Mask secured. Pt tolerating well.

## 2018-09-17 NOTE — Clinical Social Work Placement (Signed)
   CLINICAL SOCIAL WORK PLACEMENT  NOTE  Date:  09/17/2018  Patient Details  Name: Stacey Alvarez MRN: [id number] Date of Birth: 02-23-1956  Clinical Social Work is seeking post-discharge placement for this patient at the [place] level of care (*CSW will initial, date and re-position this form in  chart as items are completed):      Patient/family provided with [facility] Work Department's list of facilities offering this level of care within the geographic area requested by the patient (or if unable, by the patient's family).  Yes   Patient/family informed of their freedom to choose among providers that offer the needed level of care, that participate in Medicare, Medicaid or managed care program needed by the patient, have an available bed and are willing to accept the patient.      Patient/family informed of [facility]'s ownership interest in [facility] and [facility], as well as of the fact that they are under no obligation to receive care at these facilities.  PASRR submitted to EDS on       PASRR number received on 09/14/18     Existing PASRR number confirmed on       FL2 transmitted to all facilities in geographic area requested by pt/family on 09/14/18     FL2 transmitted to all facilities within larger geographic area on       Patient informed that his/her managed care company has contracts with or will negotiate with certain facilities, including the following:        Yes   Patient/family informed of bed offers received.  Patient chooses bed at [facility]     Physician recommends and patient chooses bed at      Patient to be transferred to [facility] on 09/17/18.  Patient to be transferred to facility by [facility]     Patient family notified on 09/17/18 of transfer.  Name of family member notified:  [name]     PHYSICIAN       Additional Comment:     _______________________________________________ Alberteen Sam, LCSW 09/17/2018, 3:02 PM

## 2018-09-22 NOTE — Unmapped (Signed)
[facility] Refill and Clinical Coordination Note  Medication(s): mycophenolate 500mg     Amber Bush, DOB: 11-19-1955  Phone: [phone] (home) [phone] (work), Alternate phone contact: N/A  Shipping address: [address]  [place] [address]  Phone or address changes today?: No  All above HIPAA information verified.  Insurance changes? No    Completed refill and clinical call assessment today to schedule patient's medication shipment from the [facility] [phone]).      MEDICATION RECONCILIATION    Confirmed the medication and dosage are correct and have not changed: Yes, regimen is correct and unchanged.    Were there any changes to your medication(s) in the past month:  No, there are no changes reported at this time.    ADHERENCE    Is this medicine transplant or covered by Medicare Part B? No.    Did you miss any doses in the past 4 weeks? No missed doses reported.  Adherence counseling provided? Not needed     SIDE EFFECT MANAGEMENT    Are you tolerating your medication?:  Amber Bush reports tolerating the medication.  Side effect management discussed: None      Therapy is appropriate and should be continued.    Evidence of clinical benefit: See Epic note from 03/09/18      FINANCIAL/SHIPPING    Delivery Scheduled: Yes, Expected medication delivery date: 10/01/18     Medication will be delivered via UPS to the home address in [facility].    Additional medications refilled: No additional medications/refills needed at this time.    The patient will receive a drug information handout for each medication shipped and additional FDA Medication Guides as required.      Amber Bush did not have any additional questions at this time.    Delivery address confirmed in Epic.     We will follow up with patient monthly for standard refill processing and delivery.      Thank you,  [name]   [facility] Specialty Pharmacist

## 2018-09-23 ENCOUNTER — Other Ambulatory Visit: Payer: Self-pay | Admitting: Family Medicine

## 2018-09-24 NOTE — Telephone Encounter (Signed)
Last OV 08/26/18 Fiorinal last filled 08/26/18 #15 with 0

## 2018-10-01 MED FILL — MYCOPHENOLATE MOFETIL 500 MG TABLET: 30 days supply | Qty: 60 | Fill #3 | Status: AC

## 2018-10-01 MED FILL — MYCOPHENOLATE MOFETIL 500 MG TABLET: ORAL | 30 days supply | Qty: 60 | Fill #3

## 2018-10-26 MED FILL — MYCOPHENOLATE MOFETIL 500 MG TABLET: 30 days supply | Qty: 60 | Fill #4 | Status: AC

## 2018-10-26 MED FILL — MYCOPHENOLATE MOFETIL 500 MG TABLET: ORAL | 30 days supply | Qty: 60 | Fill #4

## 2018-10-26 NOTE — Unmapped (Signed)
[facility] Refill Coordination Note  Specialty Medication(s): Mycophenolate 500mg   Additional Medications shipped: N/A    Amber Bush, DOB: [birth year]  Phone: [phone] (home) [phone] (work), Alternate phone contact: N/A  Phone or address changes today?: No  All above HIPAA information was verified with patient.  Shipping Address: [address]  [place] [address]   Insurance changes? Yes    Completed refill call assessment today to schedule patient's medication shipment from the [facility] [phone]).      Confirmed the medication and dosage are correct and have not changed: Yes, regimen is correct and unchanged.    Confirmed patient started or stopped the following medications in the past month:  No, there are no changes reported at this time.    Are you tolerating your medication?:  Amber Bush reports tolerating the medication.    ADHERENCE    (Below is required for Medicare Part B or Transplant patients only - per drug):   How many tablets were dispensed last month: 60  Patient currently has 5 days of medication remaining.    Did you miss any doses in the past 4 weeks? No missed doses reported.    FINANCIAL/SHIPPING    Delivery Scheduled: Yes, Expected medication delivery date: 10/27/18     Medication will be delivered via UPS to the home address in [facility].    The patient will receive a drug information handout for each medication shipped and additional FDA Medication Guides as required.      Amber Bush did not have any additional questions at this time.    We will follow up with patient monthly for standard refill processing and delivery.      Thank you,  [name]   [facility] Specialty Technician

## 2018-10-28 DIAGNOSIS — S82842D Displaced bimalleolar fracture of left lower leg, subsequent encounter for closed fracture with routine healing: Secondary | ICD-10-CM | POA: Diagnosis not present

## 2018-10-29 ENCOUNTER — Other Ambulatory Visit: Payer: Self-pay

## 2018-10-29 ENCOUNTER — Encounter: Payer: Self-pay | Admitting: Neurology

## 2018-10-29 ENCOUNTER — Ambulatory Visit: Payer: Medicare HMO | Admitting: Neurology

## 2018-10-29 VITALS — BP 144/82 | HR 82 | Resp 18 | Ht 64.0 in | Wt 230.0 lb

## 2018-10-29 DIAGNOSIS — R69 Illness, unspecified: Secondary | ICD-10-CM | POA: Diagnosis not present

## 2018-10-29 DIAGNOSIS — R55 Syncope and collapse: Secondary | ICD-10-CM | POA: Diagnosis not present

## 2018-10-29 DIAGNOSIS — J849 Interstitial pulmonary disease, unspecified: Secondary | ICD-10-CM | POA: Diagnosis not present

## 2018-10-29 DIAGNOSIS — Z8249 Family history of ischemic heart disease and other diseases of the circulatory system: Secondary | ICD-10-CM

## 2018-10-29 DIAGNOSIS — S82892K Other fracture of left lower leg, subsequent encounter for closed fracture with nonunion: Secondary | ICD-10-CM | POA: Diagnosis not present

## 2018-10-29 DIAGNOSIS — F418 Other specified anxiety disorders: Secondary | ICD-10-CM

## 2018-10-29 NOTE — Progress Notes (Signed)
[facility]  PATIENT: Stacey Alvarez DOB: [birth year]  REFERRING DOCTOR OR PCP:  Annye Asa, MD  SOURCE: patient, notes from Dr. Birdie Riddle and ED  _________________________________   HISTORICAL  CHIEF COMPLAINT:  Chief Complaint  Patient presents with  . Loss of Consciousness    Rm. 13.  Here with a friend to discuss 2 syncopal episodes--1 in Nov. 2019, and 1 in Dec. 2019. Hx. of flu (type B) in Nov. 2019. Hx. of vertigo, with fall on 09/10/18, in which she fx. left ankle. Sts. when the first episode occurred, she was shopping at [facility], paying for her items. 2nd episode  she had just finished putting flowers on her brother's grave, was walking to her parent's grave, and had the thought "I'm going down."  Denies N/V, lightheadedness, flushing. Sts. 2 men at the cemetary saw and  . Hx. of Vertigo    called 911. Sts. when she came to, she couldn't catch her breath. Known lung dz. Occ. uses oxygen. Past hx. of PE, DVT. Currently on ASA 325mg  daily. Denies dizziness with position changes or at other times. Denies chest pain. Sts. she occ. feels like heart is beating too fast, but sts. all heart testing has been ok. Sts. she has a lot of anxiety.[place]    HISTORY OF PRESENT ILLNESS:  I had the pleasure of seeing your patient, Stacey Alvarez, at [facility] for neurologic consultation regarding her episodes of syncope.  She had 1 episode of syncope in November 2019 and a second episode December 2019.  The first episode, she was shopping and standing in line when she lost consciousness.   She had lightheadedness before she lost consciousness.   She was helped to a chair and quickly came to within seconds.  The second episode occurred after she placed flowers on her brother's grave and was walking towards her parents. She knew she was going down but did not have lightheadedness or vertigo.    She was out for a few minutes and took a few minutes to get back to  baseline.   She was short of breath and anxious when she recovered.  She has interstitial lung disease and also has a h/o a PE 2 years ago (was on Xarelto).     Two men saw her  911 was called and she was taken to the emergency room.  She had shortness of breath as well.  Multiple tests were done in the emergency room and she was admitted for several days.    No evidence of DVT's though she had ground-glas appearance to the upper lobes (? Related to ILD).  She is on Cellcept and low dose prednisone for her ILD.     She has no other episodes of syncope besides these two.   She notes her handwriting is worse.   She now is in a wheelchair as her left ankle broke from a recent fall (tripped over bottom of long pants).  She has some migraine headachs and sometimes gets unusual sensations in the left scalp.  She has a FH of aneurysm.     REVIEW OF SYSTEMS: Constitutional: No fevers, chills, sweats, or change in appetite Eyes: No visual changes, double vision, eye pain Ear, nose and throat: No hearing loss, ear pain, nasal congestion, sore throat Cardiovascular: No chest pain, palpitations Respiratory: No shortness of breath at rest or with exertion.   No wheezes GastrointestinaI: No nausea, vomiting, diarrhea, abdominal pain, fecal incontinence Genitourinary: No dysuria, urinary retention  or frequency.  No nocturia. Musculoskeletal: No neck pain, back pain Integumentary: No rash, pruritus, skin lesions Neurological: as above Psychiatric: No depression at this time.  No anxiety Endocrine: No palpitations, diaphoresis, change in appetite, change in weigh or increased thirst Hematologic/Lymphatic: No anemia, purpura, petechiae. Allergic/Immunologic: No itchy/runny eyes, nasal congestion, recent allergic reactions, rashes  ALLERGIES: Allergies  Allergen Reactions  . Macrobid [Nitrofurantoin Monohyd Macro] Diarrhea    Diarrhea, GI upset  . Sulfa Antibiotics Rash    HOME MEDICATIONS:  Current  Outpatient Medications:  .  ALPRAZolam (XANAX) 1 MG tablet, Take 0.5-2 mg by mouth See admin instructions. Take 1/2 tablet every morning and take 2 tablets at bedtime, Disp: , Rfl:  .  amLODipine (NORVASC) 5 MG tablet, TAKE 1/2 TABLET BY MOUTH IN THE MORNING AND 1 TABLET BY MOUTH AT BEDTIME (Patient taking differently: Take 2.5-5 mg by mouth See admin instructions. TAKE 1/2 TABLET BY MOUTH IN THE MORNING AND 1 TABLET BY MOUTH AT BEDTIME), Disp: 135 tablet, Rfl: 2 .  butalbital-aspirin-caffeine-codeine (FIORINAL WITH CODEINE) 50-325-40-30 MG capsule, TAKE 1 CAPSULE BY MOUTH EVERY 8 HOURS AS NEEDED FOR PAIN, Disp: 30 capsule, Rfl: 0 .  cetirizine (ZYRTEC) 10 MG tablet, Take 10 mg by mouth daily., Disp: , Rfl:  .  fenofibrate 160 MG tablet, TAKE 1 TABLET(160 MG) BY MOUTH DAILY, Disp: 90 tablet, Rfl: 0 .  FLUoxetine (PROZAC) 20 MG capsule, Take 60 mg by mouth daily. , Disp: , Rfl: 4 .  fluticasone (FLONASE) 50 MCG/ACT nasal spray, Place 1 spray into both nostrils daily., Disp: , Rfl:  .  lamoTRIgine (LAMICTAL) 100 MG tablet, Take 100 mg by mouth daily., Disp: , Rfl:  .  lisinopril (PRINIVIL,ZESTRIL) 20 MG tablet, TAKE 1 TABLET(20 MG) BY MOUTH TWICE DAILY, Disp: 180 tablet, Rfl: 0 .  mycophenolate (CELLCEPT) 500 MG tablet, Take 500 mg by mouth 2 (two) times daily. , Disp: , Rfl:  .  OXYGEN, Inhale 2 L into the lungs as needed (SOB)., Disp: , Rfl:  .  pantoprazole (PROTONIX) 40 MG tablet, Take 1 tablet (40 mg total) by mouth 2 (two) times daily., Disp: 60 tablet, Rfl: 6 .  pravastatin (PRAVACHOL) 40 MG tablet, Take 1 tablet (40 mg total) by mouth daily., Disp: 90 tablet, Rfl: 1 .  predniSONE (DELTASONE) 10 MG tablet, Take 2.5 mg by mouth daily with breakfast. , Disp: , Rfl: 11 .  promethazine (PHENERGAN) 25 MG tablet, TAKE 1 TABLET(25 MG) BY MOUTH EVERY 8 HOURS AS NEEDED FOR NAUSEA OR VOMITING, Disp: 15 tablet, Rfl: 0 .  temazepam (RESTORIL) 30 MG capsule, Take 30 mg by mouth at bedtime., Disp: , Rfl:  .   enoxaparin (LOVENOX) 40 MG/0.4ML injection, Inject 0.4 mLs (40 mg total) into the skin daily for 14 days., Disp: 5.6 mL, Rfl: 0  PAST MEDICAL HISTORY: Past Medical History:  Diagnosis Date  . Allergy   . Anxiety   . Arthritis    KNEES,BACK,HIPS  . Depression   . Fibromyalgia   . GERD (gastroesophageal reflux disease)   . Hyperlipidemia   . Hypertension   . IBS (irritable bowel syndrome)   . Interstitial lung disease ([place])   . Sleep apnea     PAST SURGICAL HISTORY: Past Surgical History:  Procedure Laterality Date  . CHOLECYSTECTOMY    . COLONOSCOPY    . ORIF ANKLE FRACTURE Left 09/13/2018   Procedure: OPEN REDUCTION INTERNAL FIXATION (ORIF) ANKLE FRACTURE;  Surgeon: Erle Crocker, MD;  Location: [place];  Service: Orthopedics;  Laterality: Left;  . PARTIAL HYSTERECTOMY  1999  . POLYPECTOMY      FAMILY HISTORY: Family History  Problem Relation Age of Onset  . Diabetes Mother   . Heart disease Mother   . Anxiety disorder Mother   . Depression Mother   . Kidney disease Mother   . Diabetes Father   . Heart disease Father   . Anxiety disorder Father   . Depression Father   . Diabetes Paternal Grandmother   . Cystic fibrosis Son   . Alcohol abuse Son   . Breast cancer Maternal Aunt     SOCIAL HISTORY:  Social History   Socioeconomic History  . Marital status: Widowed    Spouse name: Not on file  . Number of children: 2  . Years of education: 46  . Highest education level: High school graduate  Occupational History  . 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 Financial resource strain: Not on file  . Food insecurity:    Worry: Not on file    Inability: Not on file  . Transportation needs:    Medical: Not on file    Non-medical: Not on file  Tobacco Use  . Smoking status: Former Research scientist (life sciences)  . Smokeless tobacco: Never Used  . Tobacco comment: smoked 2 years in late teens about 8-10 cigs daily.   Substance and Sexual  Activity  . Alcohol use: No    Alcohol/week: 0.0 standard drinks    Comment: OCC  . Drug use: No  . Sexual activity: Yes  Lifestyle  . Physical activity:    Days per week: Not on file    Minutes per session: Not on file  . Stress: Not on file  Relationships  . Social connections:    Talks on phone: Not on file    Gets together: Not on file    Attends religious service: Not on file    Active member of club or organization: Not on file    Attends meetings of clubs or organizations: Not on file    Relationship status: Not on file  . Intimate partner violence:    Fear of current or ex partner: Not on file    Emotionally abused: Not on file    Physically abused: Not on file    Forced sexual activity: Not on file  Other Topics Concern  . Not on file  Social History Narrative   Lives alone, widowed. Has one dtr. in [place]. [place] and one son in [place]./[place]im     PHYSICAL EXAM  Vitals:   10/29/18 1035  BP: (!) 144/82  Pulse: 82  Resp: 18  Weight: 230 lb (104.3 kg)  Height: 5\' 4"  (1.626 m)    Body mass index is 39.48 kg/m.   General: The patient is well-developed and well-nourished and in no acute distress  Eyes:  Funduscopic exam shows normal optic discs and retinal vessels.  Neck: The neck is supple, no carotid bruits are noted.  The neck is nontender.  Cardiovascular: The heart has a regular rate and rhythm with a normal S1 and S2. There were no murmurs, gallops or rubs. Lungs are clear to auscultation.  Skin: Extremities are without significant edema.  Musculoskeletal:  Back is nontender  Neurologic Exam  Mental status: The patient is alert and oriented x 3 at the time of the examination. The patient has apparent normal recent and remote memory, with an apparently normal attention span and concentration ability.  Speech is normal.  Cranial nerves: Extraocular movements are full. Pupils are equal, round, and reactive to light and accomodation.  Visual fields are  full.  Facial symmetry is present. There is good facial sensation to soft touch bilaterally.Facial strength is normal.  Trapezius and sternocleidomastoid strength is normal. No dysarthria is noted.  The tongue is midline, and the patient has symmetric elevation of the soft palate. No obvious hearing deficits are noted.  Motor:  Muscle bulk is normal.   Tone is normal. Strength is  5 / 5 in all 4 extremities.   Sensory: Sensory testing is intact to pinprick, soft touch and vibration sensation in all 4 extremities.  Coordination: Cerebellar testing reveals good finger-nose-finger and heel-to-shin bilaterally.  Gait and station: Station is normal.   Gait is normal. Tandem gait is slightly wide. Romberg is negative.   Reflexes: Deep tendon reflexes are symmetric and normal bilaterally.   Plantar responses are flexor.    DIAGNOSTIC DATA (LABS, IMAGING, TESTING) - I reviewed patient records, labs, notes, testing and imaging myself where available.  Lab Results  Component Value Date   WBC 7.6 09/15/2018   HGB 10.7 (L) 09/15/2018   HCT 34.0 (L) 09/15/2018   MCV 87.2 09/15/2018   PLT 316 09/15/2018      Component Value Date/Time   NA 138 09/13/2018 0255   K 3.6 09/13/2018 0255   CL 103 09/13/2018 0255   CO2 25 09/13/2018 0255   GLUCOSE 114 (H) 09/13/2018 0255   BUN 11 09/13/2018 0255   CREATININE 0.75 09/13/2018 0255   CALCIUM 8.6 (L) 09/13/2018 0255   PROT 6.3 (L) 09/12/2018 2130   ALBUMIN 3.5 09/12/2018 2130   AST 12 (L) 09/12/2018 2130   ALT 12 09/12/2018 2130   ALKPHOS 43 09/12/2018 2130   BILITOT 0.3 09/12/2018 2130   GFRNONAA >60 09/13/2018 0255   GFRAA >60 09/13/2018 0255   Lab Results  Component Value Date   CHOL 224 (H) 08/26/2018   HDL 50.80 08/26/2018   LDLCALC 138 (H) 08/26/2018   LDLDIRECT 144.0 06/12/2016   TRIG 172.0 (H) 08/26/2018   CHOLHDL 4 08/26/2018   Lab Results  Component Value Date   HGBA1C 5.2 08/26/2018   Lab Results  Component Value Date    VITAMINB12 301 02/25/2018   Lab Results  Component Value Date   TSH 0.71 08/26/2018       ASSESSMENT AND PLAN  Syncope, unspecified syncope type - Plan: MR BRAIN WO CONTRAST, MR MRA HEAD WO CONTRAST, MR MRA NECK W WO CONTRAST, EEG adult  Family history of cerebral aneurysm - Plan: MR BRAIN WO CONTRAST, MR MRA HEAD WO CONTRAST, MR MRA NECK W WO CONTRAST, EEG adult  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 Bump is a 63 year old woman with 2 episodes of loss of consciousness with no clear etiology.  We need to check an EEG to determine if there is any asymmetry or epileptiform activity.  If present, the spells might represent seizures and we would need to consider an anti-epileptiform agent.  Vertebrobasilar insufficiency also be playing a role and we need to check brain MRI and intracranial neck MR angiograms to further evaluate.  She has a family history of aneurysm and this could help to investigate this further as well.      She will follow-up with me after the studies but call sooner if there are new or  worsening neurologic symptoms.   Richard A. Felecia Shelling, MD, [facility] 02/20/3015, 01:09 AM Certified in Neurology, Clinical Neurophysiology, Sleep Medicine, Pain Medicine and Neuroimaging  [facility] [address] [place], [place] [address] [phone]

## 2018-10-30 ENCOUNTER — Telehealth: Payer: Self-pay | Admitting: Neurology

## 2018-10-30 MED ORDER — PREDNISONE 2.5 MG TABLET: 3 mg | tablet | Freq: Every day | 11 refills | 0 days | Status: AC

## 2018-10-30 MED ORDER — PREDNISONE 2.5 MG TABLET
ORAL_TABLET | Freq: Every day | ORAL | 11 refills | 0 days | Status: CP
Start: 2018-10-30 — End: 2019-10-30

## 2018-10-30 NOTE — Telephone Encounter (Signed)
[employer] medicare order sent to GI. Gi obtains the auth and will reach out to the pt to schedule.

## 2018-11-04 DIAGNOSIS — J849 Interstitial pulmonary disease, unspecified: Secondary | ICD-10-CM | POA: Diagnosis not present

## 2018-11-04 DIAGNOSIS — R0602 Shortness of breath: Secondary | ICD-10-CM | POA: Diagnosis not present

## 2018-11-04 DIAGNOSIS — G4733 Obstructive sleep apnea (adult) (pediatric): Secondary | ICD-10-CM | POA: Diagnosis not present

## 2018-11-23 NOTE — Unmapped (Signed)
[facility] Refill Coordination Note    Specialty Medication(s) to be Shipped:   Interstitial Lung Disease: mycophenolate 500 mg       Amber Bush, DOB: 1956/08/16  Phone: [phone] (home)       All above HIPAA information was verified with patient.     Completed refill call assessment today to schedule patient's medication shipment from the [facility] [phone]).       Specialty medication(s) and dose(s) confirmed: Regimen is correct and unchanged.   Changes to medications: [name] reports no changes reported at this time.  Changes to insurance: No  Questions for the pharmacist: No    Confirmed patient received Welcome Packet with first shipment. The patient will receive a drug information handout for each medication shipped and additional FDA Medication Guides as required.       DISEASE/MEDICATION-SPECIFIC INFORMATION        N/A    SPECIALTY MEDICATION ADHERENCE     Medication Adherence    Patient reported X missed doses in the last month:  0  Specialty Medication:  mycophenolate 500 mg  Patient is on additional specialty medications:  No  Patient is on more than two specialty medications:  No  Any gaps in refill history greater than 2 weeks in the last 3 months:  no  Demonstrates understanding of importance of adherence:  yes  Informant:  patient  Reliability of informant:  reliable  Confirmed plan for next specialty medication refill:  delivery by pharmacy                Mycophenolate  500 mg: 9 days of medicine on hand       SHIPPING     Shipping address confirmed in Epic.     Delivery Scheduled: Yes, Expected medication delivery date: 12/01/2018.     Medication will be delivered via UPS to the home address in Epic WAM.    Amber Bush   [facility] Specialty Technician

## 2018-11-24 ENCOUNTER — Other Ambulatory Visit: Payer: Self-pay

## 2018-11-24 ENCOUNTER — Inpatient Hospital Stay: Admission: RE | Admit: 2018-11-24 | Payer: Self-pay | Source: Ambulatory Visit

## 2018-11-25 NOTE — Telephone Encounter (Signed)
Stacey Alvarez Stacey Alvarez: [id number] (exp. 11/24/18 to 02/22/19) patient canceled her appt with GI that was scheduled for 11/24/18 states will call back to r/s.

## 2018-11-30 MED FILL — MYCOPHENOLATE MOFETIL 500 MG TABLET: ORAL | 30 days supply | Qty: 60 | Fill #5

## 2018-11-30 MED FILL — MYCOPHENOLATE MOFETIL 500 MG TABLET: 30 days supply | Qty: 60 | Fill #5 | Status: AC

## 2018-12-03 ENCOUNTER — Encounter: Payer: Self-pay | Admitting: Family Medicine

## 2018-12-03 DIAGNOSIS — J849 Interstitial pulmonary disease, unspecified: Secondary | ICD-10-CM | POA: Diagnosis not present

## 2018-12-03 DIAGNOSIS — G4733 Obstructive sleep apnea (adult) (pediatric): Secondary | ICD-10-CM | POA: Diagnosis not present

## 2018-12-03 DIAGNOSIS — R062 Wheezing: Secondary | ICD-10-CM | POA: Diagnosis not present

## 2018-12-03 MED ORDER — PRAVASTATIN SODIUM 40 MG PO TABS: 40.0000 mg | ORAL_TABLET | Freq: Every day | ORAL | 1 refills | Status: DC

## 2018-12-09 ENCOUNTER — Other Ambulatory Visit: Payer: Medicare HMO

## 2018-12-21 NOTE — Unmapped (Signed)
[facility] Refill Coordination Note    Specialty Medication(s) to be Shipped:   General Specialty: mycophenolate 500mg     Other medication(s) to be shipped:       Amber Bush, DOB: [birth year]  Phone: [phone] (home)       All above HIPAA information was verified with patient.     Completed refill call assessment today to schedule patient's medication shipment from the [facility] [phone]).       Specialty medication(s) and dose(s) confirmed: Regimen is correct and unchanged.   Changes to medications: Amber Bush reports no changes reported at this time.  Changes to insurance: No  Questions for the pharmacist: No    Confirmed patient received Welcome Packet with first shipment. The patient will receive a drug information handout for each medication shipped and additional FDA Medication Guides as required.       DISEASE/MEDICATION-SPECIFIC INFORMATION        N/A    SPECIALTY MEDICATION ADHERENCE     Medication Adherence    Patient reported X missed doses in the last month:  0  Specialty Medication:  Mycophenaolate 500mg                 Mycophenolate  500 mg: 14 days of medicine on hand       SHIPPING     Shipping address confirmed in Epic.     Delivery Scheduled: Yes, Expected medication delivery date: 041620.     Medication will be delivered via UPS to the home address in Epic WAM.    [name]   [facility] Specialty Technician

## 2018-12-25 ENCOUNTER — Encounter: Payer: Self-pay | Admitting: Family Medicine

## 2018-12-25 ENCOUNTER — Other Ambulatory Visit: Payer: Self-pay | Admitting: Family Medicine

## 2018-12-28 ENCOUNTER — Other Ambulatory Visit: Payer: Self-pay | Admitting: Family Medicine

## 2018-12-28 MED ORDER — FENOFIBRATE 160 MG PO TABS: 160.0000 mg | ORAL_TABLET | Freq: Every day | ORAL | 0 refills | Status: DC

## 2018-12-28 MED ORDER — PROMETHAZINE HCL 25 MG PO TABS: 25.0000 mg | ORAL_TABLET | Freq: Three times a day (TID) | ORAL | 0 refills | Status: DC | PRN

## 2018-12-28 MED ORDER — BUTALBITAL-ASA-CAFF-CODEINE 50-325-40-30 MG PO CAPS: ORAL_CAPSULE | ORAL | 0 refills | Status: DC

## 2018-12-28 MED ORDER — LISINOPRIL 20 MG PO TABS: 20.0000 mg | ORAL_TABLET | Freq: Every day | ORAL | 0 refills | Status: DC

## 2018-12-30 DIAGNOSIS — S82842D Displaced bimalleolar fracture of left lower leg, subsequent encounter for closed fracture with routine healing: Secondary | ICD-10-CM | POA: Diagnosis not present

## 2018-12-30 MED FILL — MYCOPHENOLATE MOFETIL 500 MG TABLET: ORAL | 30 days supply | Qty: 60 | Fill #6

## 2018-12-30 MED FILL — MYCOPHENOLATE MOFETIL 500 MG TABLET: 30 days supply | Qty: 60 | Fill #6 | Status: AC

## 2019-01-03 DIAGNOSIS — R062 Wheezing: Secondary | ICD-10-CM | POA: Diagnosis not present

## 2019-01-03 DIAGNOSIS — J849 Interstitial pulmonary disease, unspecified: Secondary | ICD-10-CM | POA: Diagnosis not present

## 2019-01-03 DIAGNOSIS — G4733 Obstructive sleep apnea (adult) (pediatric): Secondary | ICD-10-CM | POA: Diagnosis not present

## 2019-01-07 DIAGNOSIS — R69 Illness, unspecified: Secondary | ICD-10-CM | POA: Diagnosis not present

## 2019-01-14 DIAGNOSIS — M25562 Pain in left knee: Secondary | ICD-10-CM | POA: Diagnosis not present

## 2019-01-19 NOTE — Unmapped (Signed)
[facility] Refill Coordination Note    Specialty Medication(s) to be Shipped:   General Specialty: mycophenolate 500mg     Other medication(s) to be shipped:      Amber Bush, DOB: 08/09/1956  Phone: [phone] (home)       All above HIPAA information was verified with patient.     Completed refill call assessment today to schedule patient's medication shipment from the [facility] [phone]).       Specialty medication(s) and dose(s) confirmed: Regimen is correct and unchanged.   Changes to medications: Amber Bush reports no changes at this time.  Changes to insurance: No  Questions for the pharmacist: No    Confirmed patient received Welcome Packet with first shipment. The patient will receive a drug information handout for each medication shipped and additional FDA Medication Guides as required.       DISEASE/MEDICATION-SPECIFIC INFORMATION        N/A    SPECIALTY MEDICATION ADHERENCE     Medication Adherence    Patient reported X missed doses in the last month:  0  Specialty Medication:  Mycophenolate 500mg                  Mycophenolate 500 mg: 12 days of medicine on hand       SHIPPING     Shipping address confirmed in Epic.     Delivery Scheduled: Yes, Expected medication delivery date: 10.     Medication will be delivered via UPS to the home address in Epic WAM.    [name]   [facility] Specialty Technician

## 2019-01-26 ENCOUNTER — Encounter: Payer: Self-pay | Admitting: Family Medicine

## 2019-01-26 ENCOUNTER — Encounter: Payer: Self-pay | Admitting: *Deleted

## 2019-01-26 DIAGNOSIS — R69 Illness, unspecified: Secondary | ICD-10-CM | POA: Diagnosis not present

## 2019-01-27 ENCOUNTER — Ambulatory Visit: Payer: BLUE CROSS/BLUE SHIELD | Admitting: Family Medicine

## 2019-01-28 MED FILL — MYCOPHENOLATE MOFETIL 500 MG TABLET: 30 days supply | Qty: 60 | Fill #7 | Status: AC

## 2019-01-28 MED FILL — MYCOPHENOLATE MOFETIL 500 MG TABLET: ORAL | 30 days supply | Qty: 60 | Fill #7

## 2019-02-02 DIAGNOSIS — G4733 Obstructive sleep apnea (adult) (pediatric): Secondary | ICD-10-CM | POA: Diagnosis not present

## 2019-02-02 DIAGNOSIS — R062 Wheezing: Secondary | ICD-10-CM | POA: Diagnosis not present

## 2019-02-02 DIAGNOSIS — J849 Interstitial pulmonary disease, unspecified: Secondary | ICD-10-CM | POA: Diagnosis not present

## 2019-02-14 ENCOUNTER — Encounter: Payer: Self-pay | Admitting: Family Medicine

## 2019-02-15 ENCOUNTER — Telehealth: Payer: Self-pay | Admitting: Family Medicine

## 2019-02-15 ENCOUNTER — Other Ambulatory Visit: Payer: Self-pay

## 2019-02-15 MED ORDER — PANTOPRAZOLE SODIUM 40 MG PO TBEC: 40.0000 mg | DELAYED_RELEASE_TABLET | Freq: Two times a day (BID) | ORAL | 6 refills | Status: DC

## 2019-02-15 MED ORDER — LISINOPRIL 20 MG PO TABS: 20.0000 mg | ORAL_TABLET | Freq: Every day | ORAL | 0 refills | Status: DC

## 2019-02-15 NOTE — Telephone Encounter (Signed)
New prescription has been sent in.  

## 2019-02-15 NOTE — Telephone Encounter (Signed)
Copied from [place] [phone]. Topic: Quick Communication - Rx Refill/Question >> Feb 15, 2019  2:33 PM Rutherford Nail, NT wrote: Stacey Alvarez with [facility] calling and states that the patient is requesting 90 Day supply. States that she does have refills left, but not enough for 90 day supply. Would like a new prescription.**  Medication: pantoprazole (PROTONIX) 40 MG tablet  Has the patient contacted their pharmacy? yes (Agent: If no, request that the patient contact the pharmacy for the refill.) (Agent: If yes, when and what did the pharmacy advise?)  Preferred Pharmacy (with phone number or street name): [place], [place]  Agent: Please be advised that RX refills may take up to 3 business days. We ask that you follow-up with your pharmacy.

## 2019-02-16 ENCOUNTER — Other Ambulatory Visit: Payer: Self-pay | Admitting: Family Medicine

## 2019-02-16 NOTE — Unmapped (Signed)
[facility] Clinical Assessment & Refill Coordination Note    Amber Bush, DOB: 05-Jan-1956  Phone: [phone] (home)     All above HIPAA information was verified with patient.     Specialty Medication(s):   Inflammatory Disorders: mycophenolate     Current Outpatient Medications   Medication Sig Dispense Refill   ??? ALPRAZolam (XANAX) 1 MG tablet Take 2 mg by mouth nightly. 1/2 tab in the morning.     ??? amLODIPine (NORVASC) 5 MG tablet Take 7.5 mg by mouth daily.      ??? biotin 10,000 mcg cap Take 10,000 mcg by mouth Two (2) times a day.     ??? cetirizine (ZYRTEC) 10 MG tablet Take 10 mg by mouth daily.     ??? fenofibrate (LOFIBRA) 160 MG tablet Take 160 mg by mouth daily.     ??? FLUoxetine (PROZAC) 40 MG capsule Take 40 mg by mouth daily.     ??? fluticasone (FLONASE) 50 mcg/actuation nasal spray 1 spray by Each Nare route daily.     ??? lamoTRIgine (LAMICTAL) 100 MG tablet Take 100 mg by mouth daily.     ??? lisinopril (PRINIVIL,ZESTRIL) 20 MG tablet Take 20 mg by mouth Two (2) times a day.      ??? mycophenolate (CELLCEPT) 500 mg tablet Take 1 tablet (500 mg total) by mouth Two (2) times a day. 60 tablet 11   ??? predniSONE (DELTASONE) 2.5 MG tablet Take 1 tablet (2.5 mg total) by mouth daily. 30 tablet 11   ??? promethazine (PHENERGAN) 25 MG tablet Take 25 mg by mouth every six (6) hours as needed for nausea.     ??? temAZEpam (RESTORIL) 30 mg capsule Take 30 mg by mouth nightly as needed for sleep.       No current facility-administered medications for this visit.         Changes to medications: Amber Bush reports no changes at this time.(ONLY DOSE INCREASE ON FLUOXETINE DUE TO HER SON'S DEATH)    Allergies   Allergen Reactions   ??? Sulfa (Sulfonamide Antibiotics) Rash   ??? Sulfasalazine Rash       Changes to allergies: No    SPECIALTY MEDICATION ADHERENCE     MYCOPHENOLATE 500 mg: 14 days of medicine on hand     Medication Adherence    Patient reported X missed doses in the last month:  0 Specialty Medication:  MYCOPHENOLATE  Patient is on additional specialty medications:  No          Specialty medication(s) dose(s) confirmed: Regimen is correct and unchanged.     Are there any concerns with adherence? No    Adherence counseling provided? Not needed    CLINICAL MANAGEMENT AND INTERVENTION      Clinical Benefit Assessment:    Do you feel the medicine is effective or helping your condition? Yes    Clinical Benefit counseling provided? Not needed    Adverse Effects Assessment:    Are you experiencing any side effects? No    Are you experiencing difficulty administering your medicine? No    Quality of Life Assessment:    How many days over the past month did your ILD  keep you from your normal activities? For example, brushing your teeth or getting up in the morning. 0    Have you discussed this with your provider? Not needed    Therapy Appropriateness:    Is therapy appropriate? Yes, therapy is appropriate and should be continued  DISEASE/MEDICATION-SPECIFIC INFORMATION      N/A    PATIENT SPECIFIC NEEDS     ? Does the patient have any physical, cognitive, or cultural barriers? No    ? Is the patient high risk? No     ? Does the patient require a Care Management Plan? No     ? Does the patient require physician intervention or other additional services (i.e. nutrition, smoking cessation, social work)? No      SHIPPING     Specialty Medication(s) to be Shipped:   Inflammatory Disorders: mycophenolate 500MG     Other medication(s) to be shipped: N/A     Changes to insurance: No    Delivery Scheduled: Yes, Expected medication delivery date: 02/26/19.     Medication will be delivered via UPS to the confirmed home address in [facility].    The patient will receive a drug information handout for each medication shipped and additional FDA Medication Guides as required.  Verified that patient has previously received a Conservation officer, historic buildings.    All of the patient's questions and concerns have been addressed.    Amber Bush   [facility] Specialty Pharmacist

## 2019-02-17 ENCOUNTER — Other Ambulatory Visit: Payer: Self-pay | Admitting: Family Medicine

## 2019-02-17 ENCOUNTER — Ambulatory Visit: Payer: Self-pay | Admitting: Physician Assistant

## 2019-02-17 ENCOUNTER — Telehealth: Payer: Self-pay | Admitting: Family Medicine

## 2019-02-17 NOTE — Telephone Encounter (Signed)
Last OV 08/26/18 Fiorinal last filled 12/28/18 #30 with 0

## 2019-02-17 NOTE — Telephone Encounter (Signed)
Pt states that her BP is running high with her machine at home, pt asking what should she do. She was not sure if she could come in to have it checked. Please advise

## 2019-02-17 NOTE — Telephone Encounter (Signed)
If pt passes the office screening ok for an in office visit. Can bring her home machine

## 2019-02-17 NOTE — Telephone Encounter (Signed)
Pt has been scheduled.  °

## 2019-02-23 DIAGNOSIS — R69 Illness, unspecified: Secondary | ICD-10-CM | POA: Diagnosis not present

## 2019-02-25 ENCOUNTER — Ambulatory Visit: Payer: Self-pay | Admitting: Family Medicine

## 2019-02-25 MED FILL — MYCOPHENOLATE MOFETIL 500 MG TABLET: ORAL | 30 days supply | Qty: 60 | Fill #8

## 2019-02-25 MED FILL — MYCOPHENOLATE MOFETIL 500 MG TABLET: 30 days supply | Qty: 60 | Fill #8 | Status: AC

## 2019-03-05 ENCOUNTER — Other Ambulatory Visit: Payer: Self-pay

## 2019-03-05 DIAGNOSIS — J849 Interstitial pulmonary disease, unspecified: Secondary | ICD-10-CM | POA: Diagnosis not present

## 2019-03-05 DIAGNOSIS — G4733 Obstructive sleep apnea (adult) (pediatric): Secondary | ICD-10-CM | POA: Diagnosis not present

## 2019-03-05 DIAGNOSIS — R062 Wheezing: Secondary | ICD-10-CM | POA: Diagnosis not present

## 2019-03-05 MED ORDER — AMLODIPINE BESYLATE 5 MG PO TABS: ORAL_TABLET | ORAL | 0 refills | Status: DC

## 2019-03-05 NOTE — Telephone Encounter (Signed)
Rx(s) sent to pharmacy electronically.  

## 2019-03-15 NOTE — Unmapped (Signed)
[facility] Refill Coordination Note    Specialty Medication(s) to be Shipped:   General Specialty: mycophenolate  500mg     Other medication(s) to be shipped:       Amber Bush, DOB: August 31, 1956  Phone: [phone] (home)       All above HIPAA information was verified with patient.     Completed refill call assessment today to schedule patient's medication shipment from the [facility] [phone]).       Specialty medication(s) and dose(s) confirmed: Regimen is correct and unchanged.   Changes to medications: Amber Bush reports no changes at this time.  Changes to insurance: No  Questions for the pharmacist: No    Confirmed patient received Welcome Packet with first shipment. The patient will receive a drug information handout for each medication shipped and additional FDA Medication Guides as required.       DISEASE/MEDICATION-SPECIFIC INFORMATION        N/A    SPECIALTY MEDICATION ADHERENCE     Medication Adherence    Patient reported X missed doses in the last month:  0  Specialty Medication:  mycophenolate 500mg   Patient is on additional specialty medications:  No                Mycophenolate 500 mg: 14 days of medicine on hand       SHIPPING     Shipping address confirmed in Epic.     Delivery Scheduled: Yes, Expected medication delivery date: 07/08.     Medication will be delivered via UPS to the home address in Epic WAM.    [name]   [facility] Specialty Technician

## 2019-03-23 ENCOUNTER — Ambulatory Visit: Admit: 2019-03-23 | Discharge: 2019-03-24 | Payer: MEDICARE

## 2019-03-23 ENCOUNTER — Ambulatory Visit: Admit: 2019-03-23 | Discharge: 2019-03-24 | Payer: MEDICARE | Attending: Internal Medicine | Primary: Internal Medicine

## 2019-03-23 DIAGNOSIS — R0602 Shortness of breath: Secondary | ICD-10-CM | POA: Diagnosis not present

## 2019-03-23 DIAGNOSIS — G4733 Obstructive sleep apnea (adult) (pediatric): Secondary | ICD-10-CM | POA: Diagnosis not present

## 2019-03-23 DIAGNOSIS — J849 Interstitial pulmonary disease, unspecified: Secondary | ICD-10-CM | POA: Diagnosis not present

## 2019-03-23 DIAGNOSIS — I2510 Atherosclerotic heart disease of native coronary artery without angina pectoris: Secondary | ICD-10-CM | POA: Diagnosis not present

## 2019-03-23 DIAGNOSIS — M359 Systemic involvement of connective tissue, unspecified: Secondary | ICD-10-CM | POA: Diagnosis not present

## 2019-03-23 DIAGNOSIS — M549 Dorsalgia, unspecified: Secondary | ICD-10-CM | POA: Diagnosis not present

## 2019-03-23 LAB — HEPATIC FUNCTION PANEL
ALBUMIN: 4.1 g/dL (ref 3.5–5.0)
ALKALINE PHOSPHATASE: 69 U/L (ref 38–126)
BILIRUBIN DIRECT: <0.1 mg/dL (ref 0.00–0.40)
BILIRUBIN TOTAL: 0.3 mg/dL (ref 0.0–1.2)

## 2019-03-23 LAB — PRO-BNP: Natriuretic peptide.B prohormone N-Terminal:MCnc:Pt:Ser/Plas:Qn:: 12

## 2019-03-23 LAB — CBC W/ AUTO DIFF
BASOPHILS RELATIVE PERCENT: 0.5 %
EOSINOPHILS ABSOLUTE COUNT: 0.2 10*9/L (ref 0.0–0.4)
EOSINOPHILS RELATIVE PERCENT: 2.2 %
HEMATOCRIT: 36.6 % (ref 36.0–46.0)
LARGE UNSTAINED CELLS: 1 % (ref 0–4)
LYMPHOCYTES ABSOLUTE COUNT: 1.2 10*9/L — ABNORMAL LOW (ref 1.5–5.0)
LYMPHOCYTES RELATIVE PERCENT: 16.5 %
MEAN CORPUSCULAR HEMOGLOBIN CONC: 32.9 g/dL (ref 31.0–37.0)
MEAN CORPUSCULAR HEMOGLOBIN: 27.9 pg (ref 26.0–34.0)
MEAN CORPUSCULAR VOLUME: 84.8 fL (ref 80.0–100.0)
MEAN PLATELET VOLUME: 7.7 fL (ref 7.0–10.0)
MONOCYTES ABSOLUTE COUNT: 0.4 10*9/L (ref 0.2–0.8)
MONOCYTES RELATIVE PERCENT: 4.9 %
NEUTROPHILS RELATIVE PERCENT: 75 %
PLATELET COUNT: 335 10*9/L (ref 150–440)
RED BLOOD CELL COUNT: 4.31 10*12/L (ref 4.00–5.20)
RED CELL DISTRIBUTION WIDTH: 14.9 % (ref 12.0–15.0)
WBC ADJUSTED: 7.6 10*9/L (ref 4.5–11.0)

## 2019-03-23 LAB — BASIC METABOLIC PANEL
ANION GAP: 12 mmol/L (ref 7–15)
BLOOD UREA NITROGEN: 15 mg/dL (ref 7–21)
BUN / CREAT RATIO: 23
CALCIUM: 9.4 mg/dL (ref 8.5–10.2)
CHLORIDE: 102 mmol/L (ref 98–107)
CO2: 28 mmol/L (ref 22.0–30.0)
CREATININE: 0.64 mg/dL (ref 0.60–1.00)
EGFR CKD-EPI AA FEMALE: >90 mL/min/{1.73_m2} (ref >=60)
EGFR CKD-EPI NON-AA FEMALE: >90 mL/min/{1.73_m2} (ref >=60)
GLUCOSE RANDOM: 110 mg/dL (ref 70–179)
POTASSIUM: 4 mmol/L (ref 3.5–5.0)

## 2019-03-23 LAB — MEAN CORPUSCULAR VOLUME: Lab: 84.8

## 2019-03-23 LAB — GAMMAGLOBULIN; IGG: IgG:MCnc:Pt:Ser/Plas:Qn:: 583 — ABNORMAL LOW

## 2019-03-23 LAB — ERYTHROCYTE SEDIMENTATION RATE: Lab: 11

## 2019-03-23 LAB — MAGNESIUM: Magnesium:MCnc:Pt:Ser/Plas:Qn:: 1.7

## 2019-03-23 LAB — IGG: GAMMAGLOBULIN; IGG: 583 mg/dL — ABNORMAL LOW (ref 600–1700)

## 2019-03-23 LAB — ALT (SGPT): Alanine aminotransferase:CCnc:Pt:Ser/Plas:Qn:: 12

## 2019-03-23 LAB — EGFR CKD-EPI NON-AA FEMALE: Lab: >90

## 2019-03-23 LAB — C-REACTIVE PROTEIN: C reactive protein:MCnc:Pt:Ser/Plas:Qn:: 25.9 — ABNORMAL HIGH

## 2019-03-23 MED ORDER — MYCOPHENOLATE MOFETIL 500 MG TABLET
ORAL_TABLET | Freq: Two times a day (BID) | ORAL | 11 refills | 30 days | Status: CP
Start: 2019-03-23 — End: 2020-03-22
  Filled 2019-03-23: qty 60, 30d supply, fill #0

## 2019-03-23 MED FILL — MYCOPHENOLATE MOFETIL 500 MG TABLET: 30 days supply | Qty: 60 | Fill #0 | Status: AC

## 2019-03-23 NOTE — Unmapped (Signed)
.  follow up as instructed

## 2019-03-23 NOTE — Unmapped (Signed)
Refering Physician: Dr Kalman Shan, [facility]    CHief Complaint: ILD complicaitons    HPI   63yo women who presented with back pain. She had a CT abdomen done to look for kidney stones and it showed basilar marking. She than fell and had a rotator cuff tear and had an Xray that showed the basilar marking too. She was still asymptomatic until she had surgery on her sholder. 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 She was stared on xeralto and had slowly gotten better. She has no xtrapulm symptoms.    Of Note  - she is grieving the loss of her husband who died in Jul 14, 2018and her son who died 12-28-18   Today  - feels OK, biggest issue is grief    ROS   - the balance of 10 systems is negative other than noted above     PMH   - ILD  - PE, 09/2016    --> bilateral  - OSA    --> CPAP  - CAD  - Fibromyalgia  - Depression    FH   - son with CF    SH   - non-smoker  - no birds/pets  - new house    MEDS (personally reviewed in EPIC, pertinent meds noted below)     PHYSICAL EXAM   Vitals - BP 143/73  - Pulse 87  - Temp 36.2 ??C (97.1 ??F)  - Ht 163 cm (5' 4.17)  - BMI 42.00 kg/m??   Gen - awake, alert, in NAD   Derm - no rash   HEENT - no adenopathy, TMs clear   Vascular - good pulses throughout, no JVP   CV - RRR,no murmers or gallops   Pulm - faint crackles at the bases  Abd - soft, NT, ND, +BS, no hepatosplenomegally   Ext - no edema, no clubbing   Joints - no enlarged joints, no warmth, redness or effusions   Neuro - CN grossly intact, nl gait     RESULTS     Labs (reviewd in EPIC, pertinent values noted below)       PFTs (personally reveiewed and interpreted)     Date: FVC (% Pred) FEV1 (% Pred) FEF25-75(% Pred) DLCO TBBx/Results            03/23/19  1.92  1.61   66%     03/09/18  1.86  1.52   63%     10/27/17  1.70  1.41   76%     08/11/17  1.91  1.57    63%     03/24/17  2.03  1.62  1.97  67%     12/06/16  1.86 (57%)  1.52 (60%)  1.86 (83%)  70%        6 Minute Walk   - walked 421m  - lowest sat was 93% on RA    HRCT Chest (03/2016, OSH, personally reviewed and interpretted)   - faint interstitial markings  - gas trapping  - resolved GG nodule (seen 2 weeks prior on CT abdomen)    HRCT Chest (09/2016, personally reviewed and interpretted)   - worsening bibasilar streaky densitites  - gas trapping    CTA Chest (09/2016, personally reviewed and interpretted)   - bilateral sub-massive PE with signs of RH strain    A/P   63yo with ILD and PE  ILD  - 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 Less likely NSIP  - PFTs with moderate restriction and mildly reduced DLCO... PFTs up today  - walk with mild but not clinically significant hypoxia, simialr to prior  - will continue cellcept 500mg  PO BID  - will continue prednsione 2.5mg  QD... She does not like prednisone and really does not want higher doses  - MCTD w/u negative but full panel not sent  - pH/Mano with no reflux but esophogeal issues... Seeing GI  - Needs to work on weight loss and exercise... This will help the most    Sinusitis/PND  - advised neti pott twice daily  - no azithro as did not help    PE  - bilatearl and secondary to rotator cuff surgery and estrogen supplmetns  - stopped xeralto    PCP Proph:  - none needed    Bone Health  - Last Dexa:  Through her PCP    Vaccines  - Influenza:  - TDAP: 11/2015  - Prevnar 13: she says UTD  - PNA 23: she says UTD  - Shinrix: needs through local pharmacy

## 2019-03-26 ENCOUNTER — Telehealth: Payer: Self-pay | Admitting: Pulmonary Disease

## 2019-03-26 ENCOUNTER — Encounter: Payer: Self-pay | Admitting: *Deleted

## 2019-03-26 NOTE — Telephone Encounter (Signed)
Patient is scheduled to see [facility] NP on 7.13.2020 for CPAP supplies Patient has never been seen in this office for sleep MR notes in past visits that pt may need to be set up with a sleep provider but then pt declined  ATC pt to get more information

## 2019-03-26 NOTE — Telephone Encounter (Signed)
Pt calling in to talk with Janett Billow regarding a call from her. CB# [phone].

## 2019-03-26 NOTE — Telephone Encounter (Signed)
LMOMTCB x 1 

## 2019-03-26 NOTE — Telephone Encounter (Signed)
Patient returned call After extensively speaking with patient it was discovered that patient is no longer a patient of this practice for her pulmonary diagnosis - she goes to [facility].  Patient stated that her previous sleep physician is no longer practicing and she just received a letter in the mail this week stating as much but the letter was dated March 2020.  Discussed with patient the office policy about APPs seeing new patients Consult scheduled with [place] for 7.14.2020 @ 1000 Patient believes that her former PCP Dr Kathee Polite in [place] referred her for the sleep study (the place she had the sleep study done is out of business).  Called Dr Meier's office to attempt to obtain results, they will not release without signed records release  Patient is aware she will need to call to obtain these records  Nothing further needed at this time; will sign off

## 2019-03-28 ENCOUNTER — Encounter: Payer: Self-pay | Admitting: Pulmonary Disease

## 2019-03-29 ENCOUNTER — Ambulatory Visit: Payer: Medicare HMO | Admitting: Pulmonary Disease

## 2019-03-30 ENCOUNTER — Other Ambulatory Visit: Payer: Self-pay

## 2019-03-30 ENCOUNTER — Encounter: Payer: Self-pay | Admitting: Pulmonary Disease

## 2019-03-30 ENCOUNTER — Ambulatory Visit (INDEPENDENT_AMBULATORY_CARE_PROVIDER_SITE_OTHER): Payer: Medicare HMO | Admitting: Pulmonary Disease

## 2019-03-30 VITALS — BP 128/68 | HR 88 | Temp 98.1°F | Ht 64.0 in | Wt 223.2 lb

## 2019-03-30 DIAGNOSIS — G4733 Obstructive sleep apnea (adult) (pediatric): Secondary | ICD-10-CM | POA: Diagnosis not present

## 2019-03-30 NOTE — Patient Instructions (Signed)
Obstructive sleep apnea Appears well treated with your CPAP download  Continue CPAP use We [facility]  Call with any significant concerns I will see you back in 1 year, sooner if needed

## 2019-03-30 NOTE — Progress Notes (Signed)
Subjective:    Patient ID: Stacey Alvarez, female    DOB: [birth year], 63 y.o.   MRN: [id number]  Evaluation for obstructive sleep apnea  Diagnosed with OSA over 10 years ago  Has been using CPAP regularly Continues to work well for her  Company managing her CPAP supplies did close for business She needs follow-up and supplies  Compliant with CPAP use Denies any significant daytime symptoms Feels rested when she wakes up in the morning Occasional dryness of the mouth Usually uses a CPAP for over 8 hours No early morning headaches Does have chronic migraine  She does occasionally forget to use her CPAP  Weight is better controlled  She does have interstitial lung disease with stable symptoms PFT recently did show stable numbers     Review of Systems  Constitutional: Negative for fever and unexpected weight change.  HENT: Negative for congestion, dental problem, ear pain, nosebleeds, postnasal drip, rhinorrhea, sinus pressure, sneezing, sore throat and trouble swallowing.   Eyes: Negative for redness and itching.  Respiratory: Positive for cough. Negative for chest tightness, shortness of breath and wheezing.   Cardiovascular: Negative for palpitations and leg swelling.  Gastrointestinal: Negative for nausea and vomiting.  Genitourinary: Negative for dysuria.  Musculoskeletal: Negative for joint swelling.  Skin: Negative for rash.  Allergic/Immunologic: Negative.  Negative for environmental allergies, food allergies and immunocompromised state.  Neurological: Positive for headaches.  Hematological: Does not bruise/bleed easily.  Psychiatric/Behavioral: Negative for dysphoric mood. The patient is nervous/anxious.    Family History  Problem Relation Age of Onset  . Diabetes Mother   . Heart disease Mother   . Anxiety disorder Mother   . Depression Mother   . Kidney disease Mother   . Diabetes Father   . Heart disease Father   . Anxiety disorder Father   .  Depression Father   . Diabetes Paternal Grandmother   . Cystic fibrosis Son   . Alcohol abuse Son   . Breast cancer Maternal Aunt    Social History   Socioeconomic History  . Marital status: Widowed    Spouse name: Not on file  . Number of children: 2  . Years of education: 25  . Highest education level: High school graduate  Occupational History  . 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 Financial resource strain: Not on file  . Food insecurity    Worry: Not on file    Inability: Not on file  . Transportation needs    Medical: Not on file    Non-medical: Not on file  Tobacco Use  . Smoking status: Former Research scientist (life sciences)  . Smokeless tobacco: Never Used  . Tobacco comment: smoked 2 years in late teens about 8-10 cigs daily.   Substance and Sexual Activity  . Alcohol use: No    Alcohol/week: 0.0 standard drinks    Comment: OCC  . Drug use: No  . Sexual activity: Yes  Lifestyle  . Physical activity    Days per week: Not on file    Minutes per session: Not on file  . Stress: Not on file  Relationships  . Social Herbalist on phone: Not on file    Gets together: Not on file    Attends religious service: Not on file    Active member of club or organization: Not on file    Attends meetings of clubs or organizations: Not on file    Relationship status: Not on file  .  Intimate partner violence    Fear of current or ex partner: Not on file    Emotionally abused: Not on file    Physically abused: Not on file    Forced sexual activity: Not on file  Other Topics Concern  . Not on file  Social History Narrative   Lives alone, widowed. Has one dtr. in [place]. [place] and one son in [place]./fim   Past Medical History:  Diagnosis Date  . Allergy   . Anxiety   . Arthritis    KNEES,BACK,HIPS  . Depression   . Fibromyalgia   . GERD (gastroesophageal reflux disease)   . Hyperlipidemia   . Hypertension   . IBS (irritable bowel syndrome)   .  Interstitial lung disease ([place])   . Sleep apnea        Objective:   Physical Exam Constitutional:      Appearance: Normal appearance.  HENT:     Head: Normocephalic and atraumatic.  Eyes:     General:        Right eye: No discharge.        Left eye: No discharge.     Pupils: Pupils are equal, round, and reactive to light.  Neck:     Musculoskeletal: Normal range of motion and neck supple. No neck rigidity or muscular tenderness.  Cardiovascular:     Rate and Rhythm: Normal rate and regular rhythm.     Heart sounds: No murmur.  Pulmonary:     Effort: Pulmonary effort is normal.     Comments: She does have very mild rales at the bases Abdominal:     General: There is no distension.     Palpations: There is no mass.     Tenderness: There is no abdominal tenderness.  Musculoskeletal: Normal range of motion.        General: No swelling.  Skin:    General: Skin is warm and dry.     Coloration: Skin is not jaundiced or pale.  Neurological:     General: No focal deficit present.     Mental Status: She is alert.  Psychiatric:        Mood and Affect: Mood normal.    Vitals:   03/30/19 0947  BP: 128/68  Pulse: 88  Temp: 98.1 F (36.7 C)  SpO2: 98%   Results of the Epworth flowsheet 03/30/2019  Sitting and reading 0  Watching TV 0  Sitting, inactive in a public place (e.g. a theatre or a meeting) 0  As a passenger in a car for an hour without a break 0  Lying down to rest in the afternoon when circumstances permit 1  Sitting and talking to someone 0  Sitting quietly after a lunch without alcohol 0  In a car, while stopped for a few minutes in traffic 0  Total score 1   CPAP compliance data does reveal 77% compliance Residual AHI of 0.3    Assessment & Plan:  .  History of obstructive sleep apnea -Appears well treated with CPAP therapy at present -Has no significant daytime symptoms  Obesity -She is working on weight loss -Functioning fairly well at present   Interstitial lung disease -Follows up at [facility] to be doing well-stable lung functions recently  Plan: Continue CPAP at current pressures of 8 Will provide prescription for CPAP supplies  No changes to a pressure at present I will see her in a year from now Encouraged to call if she has any significant concerns  [facility]

## 2019-04-02 ENCOUNTER — Telehealth: Payer: Self-pay | Admitting: Pulmonary Disease

## 2019-04-02 NOTE — Telephone Encounter (Signed)
Spoke with the pt  She states that she spoke with Alisha at [facility] and was told that they did not have the order for CPAP supplies  The chart says otherwise  Forwarding to [facility] pool per protocol since order already placed  Thanks

## 2019-04-02 NOTE — Telephone Encounter (Signed)
Called [employer] & spoke to [place].  She states she does have order and it was sent to resupplly team.  She is going to send them another email today and let them know the pt has called [place] to check on it.  She said she would have them to call the pt.  I called the pt and told her they have the order, gave her Jenny's name and told her they should call her today or Monday.  Nothing further needed at this time.

## 2019-04-04 DIAGNOSIS — G4733 Obstructive sleep apnea (adult) (pediatric): Secondary | ICD-10-CM | POA: Diagnosis not present

## 2019-04-04 DIAGNOSIS — R062 Wheezing: Secondary | ICD-10-CM | POA: Diagnosis not present

## 2019-04-04 DIAGNOSIS — J849 Interstitial pulmonary disease, unspecified: Secondary | ICD-10-CM | POA: Diagnosis not present

## 2019-04-07 DIAGNOSIS — G4733 Obstructive sleep apnea (adult) (pediatric): Secondary | ICD-10-CM | POA: Diagnosis not present

## 2019-04-08 DIAGNOSIS — G4733 Obstructive sleep apnea (adult) (pediatric): Secondary | ICD-10-CM | POA: Diagnosis not present

## 2019-04-12 ENCOUNTER — Encounter: Payer: Self-pay | Admitting: Physician Assistant

## 2019-04-12 ENCOUNTER — Other Ambulatory Visit: Payer: Self-pay

## 2019-04-12 ENCOUNTER — Ambulatory Visit (INDEPENDENT_AMBULATORY_CARE_PROVIDER_SITE_OTHER): Payer: Medicare HMO | Admitting: Physician Assistant

## 2019-04-12 VITALS — BP 120/80 | HR 83 | Temp 98.2°F | Resp 16 | Ht 64.0 in | Wt 226.0 lb

## 2019-04-12 DIAGNOSIS — H8113 Benign paroxysmal vertigo, bilateral: Secondary | ICD-10-CM

## 2019-04-12 MED ORDER — LEVOCETIRIZINE DIHYDROCHLORIDE 5 MG PO TABS: 5.0000 mg | ORAL_TABLET | Freq: Every evening | ORAL | 0 refills | Status: DC

## 2019-04-12 MED ORDER — MECLIZINE HCL 25 MG PO TABS: 25.0000 mg | ORAL_TABLET | Freq: Three times a day (TID) | ORAL | 0 refills | Status: DC | PRN

## 2019-04-12 MED ORDER — ONDANSETRON HCL 4 MG PO TABS: 4.0000 mg | ORAL_TABLET | Freq: Three times a day (TID) | ORAL | 0 refills | Status: DC | PRN

## 2019-04-12 NOTE — Patient Instructions (Addendum)
Please keep up with Flonase and switch Zyrtec to Xyzal. Continue daily steroid. Start the Meclizine as directed if needed for dizziness. Start the exercises below. Please follow-up if symptoms are not improving. If you note any acute worsening of symptoms, please be seen at the nearest ER.  How to Perform the Epley Maneuver The Epley maneuver is an exercise that relieves symptoms of vertigo. Vertigo is the feeling that you or your surroundings are moving when they are not. When you feel vertigo, you may feel like the room is spinning and have trouble walking. Dizziness is a little different than vertigo. When you are dizzy, you may feel unsteady or light-headed. You can do this maneuver at home whenever you have symptoms of vertigo. You can do it up to 3 times a day until your symptoms go away. Even though the Epley maneuver may relieve your vertigo for a few weeks, it is possible that your symptoms will return. This maneuver relieves vertigo, but it does not relieve dizziness. What are the risks? If it is done correctly, the Epley maneuver is considered safe. Sometimes it can lead to dizziness or nausea that goes away after a short time. If you develop other symptoms, such as changes in vision, weakness, or numbness, stop doing the maneuver and call your health care provider. How to perform the Epley maneuver 1. Sit on the edge of a bed or table with your back straight and your legs extended or hanging over the edge of the bed or table. 2. Turn your head halfway toward the affected ear or side. 3. Lie backward quickly with your head turned until you are lying flat on your back. You may want to position a pillow under your shoulders. 4. Hold this position for 30 seconds. You may experience an attack of vertigo. This is normal. 5. Turn your head to the opposite direction until your unaffected ear is facing the floor. 6. Hold this position for 30 seconds. You may experience an attack of vertigo.  This is normal. Hold this position until the vertigo stops. 7. Turn your whole body to the same side as your head. Hold for another 30 seconds. 8. Sit back up. You can repeat this exercise up to 3 times a day. Follow these instructions at home:  After doing the Epley maneuver, you can return to your normal activities.  Ask your health care provider if there is anything you should do at home to prevent vertigo. He or she may recommend that you: ? Keep your head raised (elevated) with two or more pillows while you sleep. ? Do not sleep on the side of your affected ear. ? Get up slowly from bed. ? Avoid sudden movements during the day. ? Avoid extreme head movement, like looking up or bending over. Contact a health care provider if:  Your vertigo gets worse.  You have other symptoms, including: ? Nausea. ? Vomiting. ? Headache. Get help right away if:  You have vision changes.  You have a severe or worsening headache or neck pain.  You cannot stop vomiting.  You have new numbness or weakness in any part of your body. Summary  Vertigo is the feeling that you or your surroundings are moving when they are not.  The Epley maneuver is an exercise that relieves symptoms of vertigo.  If the Epley maneuver is done correctly, it is considered safe. You can do it up to 3 times a day. This information is not intended to replace advice  given to you by your health care provider. Make sure you discuss any questions you have with your health care provider. Document Released: 09/07/2013 Document Revised: 08/15/2017 Document Reviewed: 07/23/2016 Elsevier Patient Education  2020 [employer].

## 2019-04-12 NOTE — Progress Notes (Signed)
Patient presents to clinic today c/o 1 week of intermittent vertigo described as the room spinning.  Notes this occurs when she turns her head a certain way, usually to the side or down.  Notes some dizziness with position change.  Denies lightheadedness.  Denies fever, chills, sinus pressure or sinus pain.  Does note some left ear pressure and popping.  Has been taking her Flonase and Zyrtec daily for seasonal allergy symptoms.  Patient denies palpitations, chest pain, shortness of breath.  Denies recent change in medication.  Patient has history of benign positional vertigo, noting that this feels similar.  Denies vision change, difficulty with speech, focal weakness or memory changes.   Past Medical History:  Diagnosis Date  . Allergy   . Anxiety   . Arthritis    KNEES,BACK,HIPS  . Depression   . Fibromyalgia   . GERD (gastroesophageal reflux disease)   . Hyperlipidemia   . Hypertension   . IBS (irritable bowel syndrome)   . Interstitial lung disease ([place])   . Sleep apnea     Current Outpatient Medications on File Prior to Visit  Medication Sig Dispense Refill  . ALPRAZolam (XANAX) 1 MG tablet Take 0.5-2 mg by mouth See admin instructions. Take 1/2 tablet every morning and take 2 tablets at bedtime    . amLODipine (NORVASC) 5 MG tablet Take 0.5 tablets (2.5 mg total) by mouth every morning AND 1 tablet (5 mg total) at bedtime. NEEDS APPOINTMENT FOR FUTURE REFILLS. 135 tablet 0  . butalbital-aspirin-caffeine-codeine (FIORINAL WITH CODEINE) 50-325-40-30 MG capsule TAKE ONE CAPSULE BY MOUTH EVERY 8 HOURS AS NEEDED FOR MIGRAINE 21 capsule 0  . cetirizine (ZYRTEC) 10 MG tablet Take 10 mg by mouth daily.    . fenofibrate 160 MG tablet Take 1 tablet (160 mg total) by mouth daily. 90 tablet 0  . fluticasone (FLONASE) 50 MCG/ACT nasal spray Place 1 spray into both nostrils daily.    Marland Kitchen lamoTRIgine (LAMICTAL) 100 MG tablet Take 100 mg by mouth daily.    Marland Kitchen lisinopril (ZESTRIL) 20 MG tablet  Take 1 tablet (20 mg total) by mouth daily. 180 tablet 0  . mycophenolate (CELLCEPT) 500 MG tablet Take 500 mg by mouth 2 (two) times daily.     . OXYGEN Inhale 2 L into the lungs as needed (SOB).    . pantoprazole (PROTONIX) 40 MG tablet Take 1 tablet (40 mg total) by mouth 2 (two) times daily. 60 tablet 6  . pravastatin (PRAVACHOL) 40 MG tablet Take 1 tablet (40 mg total) by mouth daily. 90 tablet 1  . predniSONE (DELTASONE) 10 MG tablet Take 2.5 mg by mouth daily with breakfast.   11  . promethazine (PHENERGAN) 25 MG tablet TAKE ONE TABLET BY MOUTH EVERY 8 HOURS AS NEEDED FOR FOR NAUSEA AND VOMITING 30 tablet 0  . temazepam (RESTORIL) 30 MG capsule Take 30 mg by mouth at bedtime.    Marland Kitchen FLUoxetine (PROZAC) 40 MG capsule Take 80 mg by mouth daily.   4   No current facility-administered medications on file prior to visit.     Allergies  Allergen Reactions  . Macrobid [Nitrofurantoin Monohyd Macro] Diarrhea    Diarrhea, GI upset  . Sulfa Antibiotics Rash    Family History  Problem Relation Age of Onset  . Diabetes Mother   . Heart disease Mother   . Anxiety disorder Mother   . Depression Mother   . Kidney disease Mother   . Diabetes Father   . Heart  disease Father   . Anxiety disorder Father   . Depression Father   . Diabetes Paternal Grandmother   . Cystic fibrosis Son   . Alcohol abuse Son   . Breast cancer Maternal Aunt     Social History   Socioeconomic History  . Marital status: Widowed    Spouse name: Not on file  . Number of children: 2  . Years of education: 70  . Highest education level: High school graduate  Occupational History  . 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 Financial resource strain: Not on file  . Food insecurity    Worry: Not on file    Inability: Not on file  . Transportation needs    Medical: Not on file    Non-medical: Not on file  Tobacco Use  . Smoking status: Former Research scientist (life sciences)  . Smokeless tobacco:  Never Used  . Tobacco comment: smoked 2 years in late teens about 8-10 cigs daily.   Substance and Sexual Activity  . Alcohol use: No    Alcohol/week: 0.0 standard drinks    Comment: OCC  . Drug use: No  . Sexual activity: Yes  Lifestyle  . Physical activity    Days per week: Not on file    Minutes per session: Not on file  . Stress: Not on file  Relationships  . Social Herbalist on phone: Not on file    Gets together: Not on file    Attends religious service: Not on file    Active member of club or organization: Not on file    Attends meetings of clubs or organizations: Not on file    Relationship status: Not on file  Other Topics Concern  . Not on file  Social History Narrative   Lives alone, widowed. Has one dtr. in [place]. [place] and one son in [place]./fim   Review of Systems - See HPI.  All other ROS are negative.  BP 120/80   Pulse 83   Temp 98.2 F (36.8 C) (Skin)   Resp 16   Ht 5\' 4"  (1.626 m)   Wt 226 lb (102.5 kg)   SpO2 98%   BMI 38.79 kg/m   Physical Exam Vitals signs reviewed.  Constitutional:      Appearance: Normal appearance.  HENT:     Head: Normocephalic and atraumatic.     Right Ear: Tympanic membrane normal.     Left Ear: A middle ear effusion (serous) is present.     Nose: Nose normal.     Mouth/Throat:     Lips: Pink.     Mouth: Mucous membranes are moist.     Pharynx: Oropharynx is clear.  Neck:     Musculoskeletal: Neck supple.  Cardiovascular:     Rate and Rhythm: Normal rate and regular rhythm.     Pulses: Normal pulses.     Heart sounds: Normal heart sounds.  Pulmonary:     Effort: Pulmonary effort is normal.     Breath sounds: Normal breath sounds.  Neurological:     Mental Status: She is alert.     Assessment/Plan: 1. Benign paroxysmal positional vertigo due to bilateral vestibular disorder Patient stable and office.  Orthostatic vitals performed and negative for true orthostasis, especially given she is on  antihypertensive medications.  There is serous fluid noted behind left tympanic membrane indicating some eustachian tube dysfunction that is likely contributing to current symptoms.  Will switch Zyrtec to Xyzal.  Continue daily Flonase.  Rx meclizine for dizziness.  Zofran for nausea if needed.  Reviewed Epley maneuvers and handout given.  Strict return precautions reviewed with patient.  ER precautions reviewed. - meclizine (ANTIVERT) 25 MG tablet; Take 1 tablet (25 mg total) by mouth 3 (three) times daily as needed for dizziness.  Dispense: 30 tablet; Refill: 0   Leeanne Rio, PA-C

## 2019-04-18 ENCOUNTER — Other Ambulatory Visit: Payer: Self-pay | Admitting: Family Medicine

## 2019-04-19 NOTE — Unmapped (Signed)
[facility] Refill Coordination Note    Specialty Medication(s) to be Shipped:   General Specialty: mycophenolate 500mg     Other medication(s) to be shipped:       Amber Bush, DOB: 1956/04/11  Phone: [phone] (home)       All above HIPAA information was verified with patient.     Completed refill call assessment today to schedule patient's medication shipment from the [facility] [phone]).       Specialty medication(s) and dose(s) confirmed: Regimen is correct and unchanged.   Changes to medications: [name] reports no changes at this time.  Changes to insurance: No  Questions for the pharmacist: No    Confirmed patient received Welcome Packet with first shipment. The patient will receive a drug information handout for each medication shipped and additional FDA Medication Guides as required.       DISEASE/MEDICATION-SPECIFIC INFORMATION        N/A    SPECIALTY MEDICATION ADHERENCE     Medication Adherence    Patient reported X missed doses in the last month: 0  Specialty Medication: Mycophenolate 500mg   Patient is on additional specialty medications: No                Mycophenolate 500 mg: 10 days of medicine on hand       SHIPPING     Shipping address confirmed in Epic.     Delivery Scheduled: Yes, Expected medication delivery date: 08/05.     Medication will be delivered via UPS to the home address in Epic WAM.    Amber Bush   [facility] Specialty Technician

## 2019-04-20 MED FILL — MYCOPHENOLATE MOFETIL 500 MG TABLET: ORAL | 30 days supply | Qty: 60 | Fill #1

## 2019-04-20 MED FILL — MYCOPHENOLATE MOFETIL 500 MG TABLET: 30 days supply | Qty: 60 | Fill #1 | Status: AC

## 2019-04-29 ENCOUNTER — Other Ambulatory Visit: Payer: Self-pay | Admitting: Family Medicine

## 2019-04-30 NOTE — Telephone Encounter (Signed)
Last OV 04/12/19 (saw Cody- vertigo) Fiorinal last filled 02/17/19 #21 with 0

## 2019-05-05 DIAGNOSIS — R062 Wheezing: Secondary | ICD-10-CM | POA: Diagnosis not present

## 2019-05-05 DIAGNOSIS — J849 Interstitial pulmonary disease, unspecified: Secondary | ICD-10-CM | POA: Diagnosis not present

## 2019-05-05 DIAGNOSIS — G4733 Obstructive sleep apnea (adult) (pediatric): Secondary | ICD-10-CM | POA: Diagnosis not present

## 2019-05-08 DIAGNOSIS — G4733 Obstructive sleep apnea (adult) (pediatric): Secondary | ICD-10-CM | POA: Diagnosis not present

## 2019-05-17 NOTE — Unmapped (Signed)
[facility] Refill Coordination Note    Specialty Medication(s) to be Shipped:   General Specialty: mycophenolate 500mg     Other medication(s) to be shipped:       Amber Bush, DOB: 11-30-1955  Phone: [phone] (home)       All above HIPAA information was verified with patient.     Completed refill call assessment today to schedule patient's medication shipment from the [facility] [phone]).       Specialty medication(s) and dose(s) confirmed: Regimen is correct and unchanged.   Changes to medications: Amber Bush reports no changes at this time.  Changes to insurance: No  Questions for the pharmacist: No    Confirmed patient received Welcome Packet with first shipment. The patient will receive a drug information handout for each medication shipped and additional FDA Medication Guides as required.       DISEASE/MEDICATION-SPECIFIC INFORMATION        N/A    SPECIALTY MEDICATION ADHERENCE     Medication Adherence    Patient reported X missed doses in the last month: 0  Specialty Medication: Mycophenolate 500mg   Patient is on additional specialty medications: No                Mycophenolate 500 mg: 6 days of medicine on hand     SHIPPING     Shipping address confirmed in Epic.     Delivery Scheduled: Yes, Expected medication delivery date: 09/03.     Medication will be delivered via UPS to the home address in Epic WAM.    [name]   [facility] Specialty Technician

## 2019-05-19 MED FILL — MYCOPHENOLATE MOFETIL 500 MG TABLET: 30 days supply | Qty: 60 | Fill #2 | Status: AC

## 2019-05-19 MED FILL — MYCOPHENOLATE MOFETIL 500 MG TABLET: ORAL | 30 days supply | Qty: 60 | Fill #2

## 2019-05-20 ENCOUNTER — Encounter: Payer: Self-pay | Admitting: Family Medicine

## 2019-05-20 ENCOUNTER — Other Ambulatory Visit: Payer: Medicare HMO

## 2019-05-25 ENCOUNTER — Telehealth: Payer: Self-pay | Admitting: Neurology

## 2019-05-25 NOTE — Telephone Encounter (Signed)
[employer] medicare Josem [id number] (exp. Jun 04, 2019 to 01-Dec-2019) for CPT codes [place], [id number] & [phone]. Patient was scheduled at GI for 05/20/19 but canceled due to being sick.

## 2019-06-02 ENCOUNTER — Other Ambulatory Visit: Payer: Self-pay | Admitting: Family Medicine

## 2019-06-02 ENCOUNTER — Other Ambulatory Visit: Payer: Self-pay | Admitting: Cardiovascular Disease

## 2019-06-03 ENCOUNTER — Encounter: Payer: Self-pay | Admitting: General Practice

## 2019-06-03 ENCOUNTER — Other Ambulatory Visit: Payer: Self-pay | Admitting: Family Medicine

## 2019-06-03 NOTE — Telephone Encounter (Signed)
Pt saw Cody on 04/12/19.

## 2019-06-03 NOTE — Telephone Encounter (Signed)
Please advise pt seen 04/12/19 by Einar Pheasant. Please advise.

## 2019-06-03 NOTE — Telephone Encounter (Signed)
mychart sent to pt to inform.

## 2019-06-03 NOTE — Telephone Encounter (Signed)
This will be last 30 day supply without appt

## 2019-06-05 DIAGNOSIS — J849 Interstitial pulmonary disease, unspecified: Secondary | ICD-10-CM | POA: Diagnosis not present

## 2019-06-05 DIAGNOSIS — R062 Wheezing: Secondary | ICD-10-CM | POA: Diagnosis not present

## 2019-06-05 DIAGNOSIS — G4733 Obstructive sleep apnea (adult) (pediatric): Secondary | ICD-10-CM | POA: Diagnosis not present

## 2019-06-07 ENCOUNTER — Other Ambulatory Visit: Payer: Self-pay | Admitting: Cardiology

## 2019-06-07 MED ORDER — AMLODIPINE BESYLATE 5 MG PO TABS: ORAL_TABLET | ORAL | 1 refills | Status: DC

## 2019-06-07 NOTE — Unmapped (Signed)
[facility] Refill Coordination Note    Specialty Medication(s) to be Shipped:   General Specialty: mycophenolate 500mg      Other medication(s) to be shipped:       Amber Bush, DOB: 09-05-1956  Phone: [phone] (home)       All above HIPAA information was verified with patient.     Completed refill call assessment today to schedule patient's medication shipment from the [facility] [phone]).       Specialty medication(s) and dose(s) confirmed: Regimen is correct and unchanged.   Changes to medications: Amber Bush reports no changes at this time.  Changes to insurance: No  Questions for the pharmacist: No    Confirmed patient received Welcome Packet with first shipment. The patient will receive a drug information handout for each medication shipped and additional FDA Medication Guides as required.       DISEASE/MEDICATION-SPECIFIC INFORMATION        N/A    SPECIALTY MEDICATION ADHERENCE     Medication Adherence    Patient reported X missed doses in the last month: 0  Specialty Medication: Mycophenolate 500mg   Patient is on additional specialty medications: No                Mycophenolate 500 mg: 10 days of medicine on hand         SHIPPING     Shipping address confirmed in Epic.     Delivery Scheduled: Yes, Expected medication delivery date: 09/29.     Medication will be delivered via UPS to the home address in Epic WAM.    Amber Bush   [facility] Specialty Technician

## 2019-06-07 NOTE — Telephone Encounter (Signed)
Called patient to let her know that I refilled her Amlodipine.

## 2019-06-07 NOTE — Telephone Encounter (Signed)
° °*  STAT* If patient is at the pharmacy, call can be transferred to refill team.   1. Which medications need to be refilled? (please list name of each medication and dose if known) amLODipine (NORVASC) 5 MG tablet   2. Which pharmacy/location (including street and city if local pharmacy) is medication to be sent toHarris Insurance claims handler at [place], [place]  3. Do they need a 30 day or 90 day supply? [place]

## 2019-06-08 ENCOUNTER — Other Ambulatory Visit: Payer: Self-pay | Admitting: Family Medicine

## 2019-06-08 DIAGNOSIS — G4733 Obstructive sleep apnea (adult) (pediatric): Secondary | ICD-10-CM | POA: Diagnosis not present

## 2019-06-14 ENCOUNTER — Ambulatory Visit (INDEPENDENT_AMBULATORY_CARE_PROVIDER_SITE_OTHER): Payer: Medicare HMO | Admitting: Family Medicine

## 2019-06-14 ENCOUNTER — Other Ambulatory Visit: Payer: Self-pay

## 2019-06-14 ENCOUNTER — Encounter: Payer: Self-pay | Admitting: Family Medicine

## 2019-06-14 VITALS — BP 123/82 | HR 81 | Temp 97.6°F | Resp 16 | Ht 64.0 in | Wt 226.4 lb

## 2019-06-14 DIAGNOSIS — R350 Frequency of micturition: Secondary | ICD-10-CM

## 2019-06-14 DIAGNOSIS — Z1239 Encounter for other screening for malignant neoplasm of breast: Secondary | ICD-10-CM | POA: Diagnosis not present

## 2019-06-14 DIAGNOSIS — Z0000 Encounter for general adult medical examination without abnormal findings: Secondary | ICD-10-CM

## 2019-06-14 DIAGNOSIS — R319 Hematuria, unspecified: Secondary | ICD-10-CM

## 2019-06-14 DIAGNOSIS — F331 Major depressive disorder, recurrent, moderate: Secondary | ICD-10-CM

## 2019-06-14 DIAGNOSIS — E785 Hyperlipidemia, unspecified: Secondary | ICD-10-CM | POA: Diagnosis not present

## 2019-06-14 DIAGNOSIS — R82998 Other abnormal findings in urine: Secondary | ICD-10-CM | POA: Diagnosis not present

## 2019-06-14 DIAGNOSIS — E8881 Metabolic syndrome: Secondary | ICD-10-CM | POA: Diagnosis not present

## 2019-06-14 DIAGNOSIS — R69 Illness, unspecified: Secondary | ICD-10-CM | POA: Diagnosis not present

## 2019-06-14 DIAGNOSIS — Z23 Encounter for immunization: Secondary | ICD-10-CM | POA: Diagnosis not present

## 2019-06-14 DIAGNOSIS — I10 Essential (primary) hypertension: Secondary | ICD-10-CM | POA: Diagnosis not present

## 2019-06-14 LAB — POCT URINALYSIS DIPSTICK
Bilirubin, UA: NEGATIVE
Glucose, UA: NEGATIVE
Nitrite, UA: NEGATIVE
Protein, UA: NEGATIVE
Spec Grav, UA: <=1.005 — AB (ref 1.010–1.025)
Urobilinogen, UA: 0.2 E.U./dL
pH, UA: 7.5 (ref 5.0–8.0)

## 2019-06-14 MED ORDER — ZOSTER VAC RECOMB ADJUVANTED 50 MCG/0.5ML IM SUSR: 0.5000 mL | Freq: Once | INTRAMUSCULAR | 0 refills | Status: AC

## 2019-06-14 MED FILL — MYCOPHENOLATE MOFETIL 500 MG TABLET: ORAL | 30 days supply | Qty: 60 | Fill #3

## 2019-06-14 MED FILL — MYCOPHENOLATE MOFETIL 500 MG TABLET: 30 days supply | Qty: 60 | Fill #3 | Status: AC

## 2019-06-14 NOTE — Assessment & Plan Note (Signed)
Pt has hx of this.  Check A1C to assess for diabetes.

## 2019-06-14 NOTE — Progress Notes (Signed)
Subjective:    Patient ID: Stacey Alvarez, female    DOB: [birth year], 63 y.o.   MRN: [id number]  HPI Here today for Welcome to Medicare CPE.  Risk Factors: Hyperlipidemia- chronic problem, on Pravastatin 40mg  daily and Fenofibrate 160mg  daily.  Denies abd pain, N/V HTN- chronic problem, on Lisinopril 20mg  and Amlodipine 2.5mg  QAM and 5mg  QHS. daily w/ good control.  No CP, SOB, HAs, visual changes, edema. Obesity- pt continues to struggle w/ weight.  Not following particular diet and not exercising.  BMI is 38.86 Physical Activity: no regular activity Fall Risk: moderate- medications can cause dizziness, fell and broke ankle Depression: chronic problem, on multiple meds, seeing psych.  Lost son this year. Hearing: normal to conversational tones and whispered voice at 6 ft ADL's: independent Cognitive: normal linear thought process, memory and attention intact Home Safety: safe at home Height, Weight, BMI, Visual Acuity: see vitals, vision corrected to 20/30 w/ glasses (20/40 L, 20/50 R) Counseling: due for DEXA and mammo.  Pt declines DEXA.  UTD on colonoscopy.  Due for Prevnar and Flu today. Health Care POA/Living will- daughter is POA, has living will as well Labs Ordered: See A&P Care Plan: See A&P   Patient Care Team    Relationship Specialty Notifications Start End  Midge Minium, MD PCP - General Family Medicine  10/28/13   Chucky May, MD Consulting Physician Psychiatry  11/18/14   Aloha Gell, MD Consulting Physician Obstetrics and Gynecology  11/21/14   Michael Boston, MD Consulting Physician General Surgery  09/05/15   Jerene Bears, MD Consulting Physician Gastroenterology  09/05/15       Review of Systems Patient reports no hearing changes, adenopathy,fever, weight change,  persistant/recurrent hoarseness , swallowing issues, chest pain, palpitations, edema, persistant/recurrent cough, hemoptysis, dyspnea (rest/exertional/paroxysmal nocturnal), gastrointestinal  bleeding (melena, rectal bleeding), abdominal pain, significant heartburn, bowel changes, Gyn symptoms (abnormal  bleeding, pain),  syncope, focal weakness, memory loss, numbness & tingling, hair/nail changes, abnormal bruising or bleeding.   + cataracts + bladder frequency and urgency- sxs have worsened x3 months + skin rash    Objective:   Physical Exam General Appearance:    Alert, cooperative, no distress, appears stated age  Head:    Normocephalic, without obvious abnormality, atraumatic  Eyes:    PERRL, conjunctiva/corneas clear, EOM's intact, fundi    benign, both eyes  Ears:    Normal TM's and external ear canals, both ears  Nose:   Deferred due to COVID  Throat:   Neck:   Supple, symmetrical, trachea midline, no adenopathy;    Thyroid: no enlargement/tenderness/nodules  Back:     Symmetric, no curvature, ROM normal, no CVA tenderness  Lungs:     Clear to auscultation bilaterally, respirations unlabored  Chest Wall:    No tenderness or deformity   Heart:    Regular rate and rhythm, S1 and S2 normal, no murmur, rub   or gallop  Breast Exam:    Deferred to mammo  Abdomen:     Soft, non-tender, bowel sounds active all four quadrants,    no masses, no organomegaly  Genitalia:    Deferred  Rectal:    Extremities:   Extremities normal, atraumatic, no cyanosis or edema  Pulses:   2+ and symmetric all extremities  Skin:   Skin color, texture, turgor normal, + tinea versicolor on back  Lymph nodes:   Cervical, supraclavicular, and axillary nodes normal  Neurologic:   CNII-XII intact, normal strength, sensation and reflexes  throughout          Assessment & Plan:

## 2019-06-14 NOTE — Addendum Note (Signed)
Addended by: Davis Gourd on: 06/14/2019 03:20 PM   Modules accepted: Orders

## 2019-06-14 NOTE — Assessment & Plan Note (Signed)
Pt has BMI of 38.86.  This combined with HTN and hyperlipidemia classifies as morbidly obese.  Encouraged healthy diet and regular exercise.  Will follow.

## 2019-06-14 NOTE — Assessment & Plan Note (Signed)
Chronic problem.  Adequate control.  Asymptomatic.  Check labs.  No anticipated med changes 

## 2019-06-14 NOTE — Assessment & Plan Note (Signed)
Chronic problem.  Tolerating statin w/o difficulty.  Encouraged healthy diet and regular exercise.  Check labs.  Adjust meds prn. 

## 2019-06-14 NOTE — Patient Instructions (Addendum)
Follow up in 6 months to recheck BP and cholesterol We'll notify you of your lab results and make any changes if needed Continue to work on healthy diet and regular exercise- you can do it!! We'll call you to schedule your mammogram Call with any questions or concerns Stay Safe! Hang in there!!!

## 2019-06-14 NOTE — Assessment & Plan Note (Signed)
Chronic problem.  On multiple medications, following w/ psych.

## 2019-06-14 NOTE — Assessment & Plan Note (Signed)
Pt's PE WNL w/ exception of obesity.  Due for mammo, DEXA.  Declines DEXA.  Flu and Prevnar given.  Check labs.  Anticipatory guidance provided.

## 2019-06-15 ENCOUNTER — Encounter: Payer: Self-pay | Admitting: General Practice

## 2019-06-15 LAB — CBC WITH DIFFERENTIAL/PLATELET
Basophils Absolute: 0.1 10*3/uL (ref 0.0–0.1)
Basophils Relative: 1.3 % (ref 0.0–3.0)
Eosinophils Absolute: 0.2 10*3/uL (ref 0.0–0.7)
Eosinophils Relative: 2.1 % (ref 0.0–5.0)
HCT: 39 % (ref 36.0–46.0)
Hemoglobin: 13.1 g/dL (ref 12.0–15.0)
Lymphocytes Relative: 18.6 % (ref 12.0–46.0)
Lymphs Abs: 1.6 10*3/uL (ref 0.7–4.0)
MCHC: 33.4 g/dL (ref 30.0–36.0)
MCV: 83.6 fl (ref 78.0–100.0)
Monocytes Absolute: 0.4 10*3/uL (ref 0.1–1.0)
Monocytes Relative: 4.5 % (ref 3.0–12.0)
Neutro Abs: 6.3 10*3/uL (ref 1.4–7.7)
Neutrophils Relative %: 73.5 % (ref 43.0–77.0)
Platelets: 329 10*3/uL (ref 150.0–400.0)
RBC: 4.67 Mil/uL (ref 3.87–5.11)
RDW: 14.3 % (ref 11.5–15.5)
WBC: 8.5 10*3/uL (ref 4.0–10.5)

## 2019-06-15 LAB — LIPID PANEL
Cholesterol: 216 mg/dL — ABNORMAL HIGH (ref 0–200)
HDL: 55.6 mg/dL (ref >39.00)
LDL Cholesterol: 127 mg/dL — ABNORMAL HIGH (ref 0–99)
NonHDL: 160.59
Total CHOL/HDL Ratio: 4
Triglycerides: 169 mg/dL — ABNORMAL HIGH (ref 0.0–149.0)
VLDL: 33.8 mg/dL (ref 0.0–40.0)

## 2019-06-15 LAB — HEPATIC FUNCTION PANEL
ALT: 9 U/L (ref 0–35)
AST: 10 U/L (ref 0–37)
Albumin: 4.6 g/dL (ref 3.5–5.2)
Alkaline Phosphatase: 65 U/L (ref 39–117)
Bilirubin, Direct: 0.1 mg/dL (ref 0.0–0.3)
Total Bilirubin: 0.5 mg/dL (ref 0.2–1.2)
Total Protein: 7.1 g/dL (ref 6.0–8.3)

## 2019-06-15 LAB — BASIC METABOLIC PANEL
BUN: 16 mg/dL (ref 6–23)
CO2: 28 mEq/L (ref 19–32)
Calcium: 10.1 mg/dL (ref 8.4–10.5)
Chloride: 100 mEq/L (ref 96–112)
Creatinine, Ser: 0.75 mg/dL (ref 0.40–1.20)
GFR: 77.41 mL/min (ref >60.00)
Glucose, Bld: 98 mg/dL (ref 70–99)
Potassium: 4.7 mEq/L (ref 3.5–5.1)
Sodium: 138 mEq/L (ref 135–145)

## 2019-06-15 LAB — TSH: TSH: 0.6 u[IU]/mL (ref 0.35–4.50)

## 2019-06-15 LAB — HEMOGLOBIN A1C: Hgb A1c MFr Bld: 5.4 % (ref 4.6–6.5)

## 2019-06-16 ENCOUNTER — Other Ambulatory Visit: Payer: Self-pay | Admitting: Family Medicine

## 2019-06-16 LAB — URINE CULTURE
MICRO NUMBER:: 929808
SPECIMEN QUALITY:: ADEQUATE

## 2019-06-16 MED ORDER — CEPHALEXIN 500 MG PO CAPS: 500.0000 mg | ORAL_CAPSULE | Freq: Two times a day (BID) | ORAL | 0 refills | Status: AC

## 2019-06-16 NOTE — Progress Notes (Signed)
Prescription sent for Keflex for UTI

## 2019-06-17 ENCOUNTER — Encounter: Payer: Self-pay | Admitting: Family Medicine

## 2019-06-17 ENCOUNTER — Other Ambulatory Visit: Payer: Self-pay | Admitting: General Practice

## 2019-06-17 DIAGNOSIS — R2232 Localized swelling, mass and lump, left upper limb: Secondary | ICD-10-CM

## 2019-06-17 MED ORDER — FLUCONAZOLE 150 MG PO TABS: 150.0000 mg | ORAL_TABLET | Freq: Once | ORAL | 0 refills | Status: AC

## 2019-06-18 ENCOUNTER — Other Ambulatory Visit: Payer: Self-pay | Admitting: Family Medicine

## 2019-06-18 DIAGNOSIS — R2232 Localized swelling, mass and lump, left upper limb: Secondary | ICD-10-CM

## 2019-06-22 ENCOUNTER — Other Ambulatory Visit: Payer: Self-pay

## 2019-06-22 ENCOUNTER — Ambulatory Visit
Admission: RE | Admit: 2019-06-22 | Discharge: 2019-06-22 | Disposition: A | Discharge status: Home / Self Care | Payer: Medicare HMO | Source: Ambulatory Visit | Attending: Family Medicine | Admitting: Family Medicine

## 2019-06-22 DIAGNOSIS — R2232 Localized swelling, mass and lump, left upper limb: Secondary | ICD-10-CM

## 2019-06-22 DIAGNOSIS — R928 Other abnormal and inconclusive findings on diagnostic imaging of breast: Secondary | ICD-10-CM | POA: Diagnosis not present

## 2019-06-22 DIAGNOSIS — N644 Mastodynia: Secondary | ICD-10-CM | POA: Diagnosis not present

## 2019-06-23 ENCOUNTER — Telehealth: Payer: Self-pay

## 2019-06-23 ENCOUNTER — Encounter: Payer: Self-pay | Admitting: Cardiology

## 2019-06-23 ENCOUNTER — Telehealth (INDEPENDENT_AMBULATORY_CARE_PROVIDER_SITE_OTHER): Payer: Medicare HMO | Admitting: Cardiology

## 2019-06-23 DIAGNOSIS — Z86711 Personal history of pulmonary embolism: Secondary | ICD-10-CM | POA: Diagnosis not present

## 2019-06-23 DIAGNOSIS — E785 Hyperlipidemia, unspecified: Secondary | ICD-10-CM | POA: Insufficient documentation

## 2019-06-23 MED ORDER — AMLODIPINE BESYLATE 5 MG PO TABS: ORAL_TABLET | ORAL | 3 refills | Status: DC

## 2019-06-23 NOTE — Assessment & Plan Note (Signed)
Jan 2018 after rotator cuff repair. Xarelto has been discontinued.  She is followed by Dr Farley Ly at [facility]

## 2019-06-23 NOTE — Telephone Encounter (Signed)

## 2019-06-23 NOTE — Assessment & Plan Note (Signed)
Followed by her PCP. 

## 2019-06-23 NOTE — Patient Instructions (Signed)
Medication Instructions:  Your physician recommends that you continue on your current medications as directed. Please refer to the Current Medication list given to you today. If you need a refill on your cardiac medications before your next appointment, please call your pharmacy.   Lab work: None  If you have labs (blood work) drawn today and your tests are completely normal, you will receive your results only by: Marland Kitchen MyChart Message (if you have MyChart) OR . A paper copy in the mail If you have any lab test that is abnormal or we need to change your treatment, we will call you to review the results.  Testing/Procedures: None   Follow-Up: At [facility], you and your health needs are our priority.  As part of our continuing mission to provide you with exceptional heart care, we have created designated Provider Care Teams.  These Care Teams include your primary Cardiologist (physician) and Advanced Practice Providers (APPs -  Physician Assistants and Nurse Practitioners) who all work together to provide you with the care you need, when you need it. You will need a follow up appointment in 12 months. Please call our office 2 months in advance to schedule this appointment.  You may see Dr Skeet Latch or one of the following Advanced Practice Providers on your designated Care Team:   Kerin Ransom, PA-C Roby Lofts, [place] . Sande Rives, PA-C  Any Other Special Instructions Will Be Listed Below (If Applicable).

## 2019-06-23 NOTE — Assessment & Plan Note (Signed)
Echo Jan 2018- normal LVF, mild LVH, grade 1 DD

## 2019-06-23 NOTE — Telephone Encounter (Signed)
Contacted patient to discuss AVS Instructions. Gave patient Stacey Alvarez's recommendations from today's virtual office visit. Informed patient that someone from the scheduling dept will be in contact with them to schedule their follow up appt. Patient voiced understanding and AVS mailed.    

## 2019-06-23 NOTE — Assessment & Plan Note (Signed)
Dr Farley Ly at [facility] follows.  Stable on chronic steroids and cellcept

## 2019-06-23 NOTE — Progress Notes (Signed)
Virtual Visit via Telephone Note   This visit type was conducted due to national recommendations for restrictions regarding the COVID-19 Pandemic (e.g. social distancing) in an effort to limit this patient's exposure and mitigate transmission in our community.  Due to her co-morbid illnesses, this patient is at least at moderate risk for complications without adequate follow up.  This format is felt to be most appropriate for this patient at this time.  The patient did not have access to video technology/had technical difficulties with video requiring transitioning to audio format only (telephone).  All issues noted in this document were discussed and addressed.  No physical exam could be performed with this format.  Please refer to the patient's chart for her  consent to telehealth for [facility].   Date:  06/23/2019   ID:  Stacey Alvarez, DOB 06-24-1956, MRN [id number]  Patient Location: Home Provider Location: Home  PCP:  Midge Minium, MD  Cardiologist:  Skeet Latch, MD  Electrophysiologist:  None   Evaluation Performed:  Follow-Up Visit  Chief Complaint:  none  History of Present Illness:    Stacey Alvarez is a 63 y.o. female with a history of palpitations, hypertension, interstitial lung disease, and prior provoked pulmonary embolism.  The patient had a low risk Myoview study in November 2018.  Echocardiogram in January 2018 showed normal LV function with mild LVH and grade 1 diastolic dysfunction.  In January 2018 she had rotator cuff repair.  Afterwards she had a pulmonary embolism and DVT.  She was treated with Xarelto which has since been discontinued.  She also has a history of interstitial lung disease.  She has been treated with steroids and CellCept.  She is followed by Dr. Farley Ly at [facility].  She tells me this is been stable.  Other problems include obesity and history of sleep apnea and dyslipidemia.  She also has depression, she lost her son and her husband in the past 2  years.  She has had no cardiac issues.  She says her blood pressures under good control on her current medicine regime and will resume this.  We will arrange for a follow-up in 1 year with Dr. Oval Linsey in the office. Her Amlodipine was refilled at the current dose.   The patient does not have symptoms concerning for COVID-19 infection (fever, chills, cough, or new shortness of breath).    Past Medical History:  Diagnosis Date   Allergy    Anxiety    Arthritis    KNEES,BACK,HIPS   Depression    Fibromyalgia    GERD (gastroesophageal reflux disease)    Hyperlipidemia    Hypertension    IBS (irritable bowel syndrome)    Interstitial lung disease ([place])    Pulmonary embolism ([place]) 09/2016   provoked- s/p rotator cuff repair   Sleep apnea    Past Surgical History:  Procedure Laterality Date   CHOLECYSTECTOMY     COLONOSCOPY     ORIF ANKLE FRACTURE Left 09/13/2018   Procedure: OPEN REDUCTION INTERNAL FIXATION (ORIF) ANKLE FRACTURE;  Surgeon: Erle Crocker, MD;  Location: [facility];  Service: Orthopedics;  Laterality: Left;   PARTIAL HYSTERECTOMY  1999   POLYPECTOMY       Current Meds  Medication Sig   ALPRAZolam (XANAX) 1 MG tablet Take 0.5-2 mg by mouth See admin instructions. Take 1/2 tablet every morning and take 2 tablets at bedtime   amLODipine (NORVASC) 5 MG tablet TAKE 1/2 TABLET (2.5 MG) IN THE MORNING AND 1  WHOLE TABLET (5 MG) AT BEDTIME.   butalbital-aspirin-caffeine-codeine (FIORINAL WITH CODEINE) 50-325-40-30 MG capsule TAKE ONE CAPSULE BY MOUTH EVERY 8 HOURS AS NEEDED FOR MIGRAINE   cetirizine (ZYRTEC) 10 MG tablet Take 10 mg by mouth daily.   fenofibrate 160 MG tablet TAKE ONE TABLET BY MOUTH DAILY   FLUoxetine (PROZAC) 20 MG tablet Take 20 mg by mouth daily. Take with 40mg  for a total of 60mg  daily   FLUoxetine (PROZAC) 40 MG capsule Take 40 mg by mouth daily. Take with 20mg  for a total of 60mg  daily   fluticasone (FLONASE) 50 MCG/ACT  nasal spray Place 1 spray into both nostrils daily.   lamoTRIgine (LAMICTAL) 100 MG tablet Take 100 mg by mouth daily.   lisinopril (ZESTRIL) 20 MG tablet Take 1 tablet (20 mg total) by mouth daily.   mycophenolate (CELLCEPT) 500 MG tablet Take 500 mg by mouth 2 (two) times daily.    OXYGEN Inhale 2 L into the lungs as needed (SOB).   pantoprazole (PROTONIX) 40 MG tablet Take 1 tablet (40 mg total) by mouth 2 (two) times daily.   pravastatin (PRAVACHOL) 40 MG tablet TAKE 1 TABLET BY MOUTH DAILY   predniSONE (DELTASONE) 10 MG tablet Take 2.5 mg by mouth daily with breakfast.    promethazine (PHENERGAN) 25 MG tablet TAKE ONE TABLET BY MOUTH EVERY 8 HOURS AS NEEDED FOR FOR NAUSEA AND VOMITING   temazepam (RESTORIL) 30 MG capsule Take 30 mg by mouth at bedtime.     Allergies:   Macrobid [nitrofurantoin monohyd macro] and Sulfa antibiotics   Social History   Tobacco Use   Smoking status: Former Smoker   Smokeless tobacco: Never Used   Tobacco comment: smoked 2 years in late teens about 8-10 cigs daily.   Substance Use Topics   Alcohol use: No    Alcohol/week: 0.0 standard drinks    Comment: OCC   Drug use: No     Family Hx: The patient's family history includes Alcohol abuse in her son; Anxiety disorder in her father and mother; Breast cancer in her maternal aunt and maternal aunt; Cystic fibrosis in her son; Depression in her father and mother; Diabetes in her father, mother, and paternal grandmother; Heart disease in her father and mother; Kidney disease in her mother.  ROS:   Please see the history of present illness.    All other systems reviewed and are negative.   Prior CV studies:   The following studies were reviewed today:  Echo Jan 2018 Myoview Nov 2018  Labs/Other Tests and Data Reviewed:    EKG:  No ECG reviewed.  Recent Labs: 09/12/2018: B Natriuretic Peptide 30.0 06/14/2019: ALT 9; BUN 16; Creatinine, Ser 0.75; Hemoglobin 13.1; Platelets 329.0;  Potassium 4.7; Sodium 138; TSH 0.60   Recent Lipid Panel Lab Results  Component Value Date/Time   CHOL 216 (H) 06/14/2019 03:09 PM   TRIG 169.0 (H) 06/14/2019 03:09 PM   HDL 55.60 06/14/2019 03:09 PM   CHOLHDL 4 06/14/2019 03:09 PM   LDLCALC 127 (H) 06/14/2019 03:09 PM   LDLDIRECT 144.0 06/12/2016 11:12 AM    Wt Readings from Last 3 Encounters:  06/23/19 224 lb (101.6 kg)  06/14/19 226 lb 6 oz (102.7 kg)  04/12/19 226 lb (102.5 kg)     Objective:    Vital Signs:  Ht 5\' 4"  (1.626 m)    Wt 224 lb (101.6 kg)    BMI 38.45 kg/m    VITAL SIGNS:  reviewed  ASSESSMENT & PLAN:  History of pulmonary embolus (PE) Jan 2018 after rotator cuff repair. Xarelto has been discontinued.  She is followed by Dr Farley Ly at [facility] Jan 2018- normal LVF, mild LVH, grade 1 DD  ILD (interstitial lung disease) ([place]) Dr Farley Ly at [facility] follows.  Stable on chronic steroids and cellcept  Dyslipidemia Followed by her PCP-  COVID-19 Education: The signs and symptoms of COVID-19 were discussed with the patient and how to seek care for testing (follow up with PCP or arrange E-visit).  The importance of social distancing was discussed today.  Time:   Today, I have spent 15 minutes with the patient with telehealth technology discussing the above problems.     Medication Adjustments/Labs and Tests Ordered: Current medicines are reviewed at length with the patient today.  Concerns regarding medicines are outlined above.   Tests Ordered: No orders of the defined types were placed in this encounter.   Medication Changes: No orders of the defined types were placed in this encounter.   Follow Up:  In Person One year Dr Oval Linsey  Signed, Kerin Ransom, PA-C  06/23/2019 10:06 AM    [place]

## 2019-07-05 DIAGNOSIS — R062 Wheezing: Secondary | ICD-10-CM | POA: Diagnosis not present

## 2019-07-05 DIAGNOSIS — G4733 Obstructive sleep apnea (adult) (pediatric): Secondary | ICD-10-CM | POA: Diagnosis not present

## 2019-07-05 DIAGNOSIS — J849 Interstitial pulmonary disease, unspecified: Secondary | ICD-10-CM | POA: Diagnosis not present

## 2019-07-05 NOTE — Unmapped (Signed)
[facility] Refill Coordination Note    Specialty Medication(s) to be Shipped:   General Specialty: mycophenolate 500mg     Other medication(s) to be shipped:       Amber Bush, DOB: 07/09/1956  Phone: [phone] (home)       All above HIPAA information was verified with patient.     Completed refill call assessment today to schedule patient's medication shipment from the [facility] [phone]).       Specialty medication(s) and dose(s) confirmed: Regimen is correct and unchanged.   Changes to medications: Amber Bush reports no changes at this time.  Changes to insurance: No  Questions for the pharmacist: No    Confirmed patient received Welcome Packet with first shipment. The patient will receive a drug information handout for each medication shipped and additional FDA Medication Guides as required.       DISEASE/MEDICATION-SPECIFIC INFORMATION        N/A    SPECIALTY MEDICATION ADHERENCE     Medication Adherence    Specialty Medication: Mycophenolate 500mg   Patient is on additional specialty medications: No  Informant: patient                Mycophenolate 500 mg: 14 days of medicine on hand         SHIPPING     Shipping address confirmed in Epic.     Delivery Scheduled: Yes, Expected medication delivery date: 10/28.     Medication will be delivered via UPS to the home address in Epic WAM.    Amber Bush   [facility] Specialty Technician

## 2019-07-08 DIAGNOSIS — G4733 Obstructive sleep apnea (adult) (pediatric): Secondary | ICD-10-CM | POA: Diagnosis not present

## 2019-07-13 MED FILL — MYCOPHENOLATE MOFETIL 500 MG TABLET: ORAL | 30 days supply | Qty: 60 | Fill #4

## 2019-07-13 MED FILL — MYCOPHENOLATE MOFETIL 500 MG TABLET: 30 days supply | Qty: 60 | Fill #4 | Status: AC

## 2019-07-19 ENCOUNTER — Other Ambulatory Visit: Payer: Self-pay | Admitting: Family Medicine

## 2019-07-26 ENCOUNTER — Other Ambulatory Visit: Payer: Self-pay | Admitting: Family Medicine

## 2019-07-26 NOTE — Telephone Encounter (Signed)
Last OV 06/14/19 Fiorinal last filled 06/03/19 #21 with 0

## 2019-07-30 ENCOUNTER — Other Ambulatory Visit: Payer: Self-pay

## 2019-07-30 ENCOUNTER — Encounter: Payer: Self-pay | Admitting: Family Medicine

## 2019-07-30 ENCOUNTER — Ambulatory Visit (INDEPENDENT_AMBULATORY_CARE_PROVIDER_SITE_OTHER): Payer: Medicare HMO | Admitting: Family Medicine

## 2019-07-30 VITALS — HR 62

## 2019-07-30 DIAGNOSIS — R42 Dizziness and giddiness: Secondary | ICD-10-CM

## 2019-07-30 DIAGNOSIS — R296 Repeated falls: Secondary | ICD-10-CM | POA: Diagnosis not present

## 2019-07-30 NOTE — Progress Notes (Signed)
I have discussed the procedure for the virtual visit with the patient who has given consent to proceed with assessment and treatment.   Pt unable to obtain vitals.   Zakai Gonyea L Nikeisha Klutz, CMA     

## 2019-07-30 NOTE — Progress Notes (Signed)
Virtual Visit via Video   I connected with patient on 07/30/19 at  3:00 PM EST by a video enabled telemedicine application and verified that I am speaking with the correct person using two identifiers.  Location patient: Home Location provider: [facility]up[facility]ist, Office Persons participating in the virtual visit: Patient, Provider, [place] (Jess B)  I discussed the limitations of evaluation and management by telemedicine and the availability of in person appointments. The patient expressed understanding and agreed to proceed.  Subjective:   HPI:   Dizziness/Frequent Falls- pt reports she is having dizziness after taking AM meds.  Dizziness occurs ~30-60 minutes after taking meds.  Pt reports dizziness lasts 'all day and all night'.  Unable to bend down to pick things up.  Having to use door frame to steady herself.  Denies vertigo.  Pt is to have CT ang of head/neck and MRI brain per Dr Felecia Shelling (Neuro).  Imaging was ordered in Feb but pt has not called to schedule.  Pt reports 'this has been going on for about 6 months'.  Pt is not eating 3 meals/day, low motivation and energy since husband's death.  Would like to wean off some of her psych meds- unclear if this is causing her dizziness.  ROS:   See pertinent positives and negatives per HPI.  Patient Active Problem List   Diagnosis Date Noted  . History of pulmonary embolus (PE) 06/23/2019  . Dyslipidemia 06/23/2019  . Moderate recurrent major depression ([place]) 06/14/2019  . Family history of cerebral aneurysm 10/29/2018  . Syncope 10/29/2018  . Closed left ankle fracture 09/13/2018  . Vertigo 08/26/2018  . ILD (interstitial lung disease) ([place]) 10/01/2016  . Atherosclerosis of native coronary artery without angina pectoris 10/01/2016  . Chronic cough 04/05/2016  . Incidental lung nodule, > 51mm and < 54mm 04/05/2016  . History of sleep apnea 04/05/2016  . Back pain 02/07/2016  . Morbid obesity ([place]) 11/24/2015  . Hyperlipidemia  05/24/2015  . Diverticulitis 01/09/2015  . Routine general medical examination at a health care facility 11/21/2014  . Essential hypertension 10/28/2013  . OSA (obstructive sleep apnea) 10/28/2013  . Insulin resistance 10/28/2013  . GERD (gastroesophageal reflux disease) 10/28/2013  . Postmenopausal HRT (hormone replacement therapy) 10/28/2013    Social History   Tobacco Use  . Smoking status: Former Research scientist (life sciences)  . Smokeless tobacco: Never Used  . Tobacco comment: smoked 2 years in late teens about 8-10 cigs daily.   Substance Use Topics  . Alcohol use: No    Alcohol/week: 0.0 standard drinks    Comment: OCC    Current Outpatient Medications:  .  ALPRAZolam (XANAX) 1 MG tablet, Take 0.5-2 mg by mouth See admin instructions. Take 1/2 tablet every morning and take 2 tablets at bedtime, Disp: , Rfl:  .  amLODipine (NORVASC) 5 MG tablet, TAKE 1/2 TABLET (2.5 MG) IN THE MORNING AND 1 WHOLE TABLET (5 MG) AT BEDTIME., Disp: 135 tablet, Rfl: 3 .  butalbital-aspirin-caffeine-codeine (FIORINAL WITH CODEINE) 50-325-40-30 MG capsule, TAKE ONE CAPSULE BY MOUTH EVERY 8 HOURS AS NEEDED FOR MIGRAINE, Disp: 21 capsule, Rfl: 0 .  cetirizine (ZYRTEC) 10 MG tablet, Take 10 mg by mouth daily., Disp: , Rfl:  .  fenofibrate 160 MG tablet, TAKE ONE TABLET BY MOUTH DAILY, Disp: 90 tablet, Rfl: 0 .  FLUoxetine (PROZAC) 20 MG tablet, Take 20 mg by mouth daily. Take with 40mg  for a total of 60mg  daily, Disp: , Rfl:  .  FLUoxetine (PROZAC) 40 MG capsule, Take  40 mg by mouth daily. Take with 20mg  for a total of 60mg  daily, Disp: , Rfl: 4 .  fluticasone (FLONASE) 50 MCG/ACT nasal spray, Place 1 spray into both nostrils daily., Disp: , Rfl:  .  lamoTRIgine (LAMICTAL) 100 MG tablet, Take 100 mg by mouth daily., Disp: , Rfl:  .  lisinopril (ZESTRIL) 20 MG tablet, Take 1 tablet (20 mg total) by mouth daily., Disp: 180 tablet, Rfl: 0 .  mycophenolate (CELLCEPT) 500 MG tablet, Take 500 mg by mouth 2 (two) times daily. , Disp:  , Rfl:  .  OXYGEN, Inhale 2 L into the lungs as needed (SOB)., Disp: , Rfl:  .  pantoprazole (PROTONIX) 40 MG tablet, Take 1 tablet (40 mg total) by mouth 2 (two) times daily., Disp: 60 tablet, Rfl: 6 .  pravastatin (PRAVACHOL) 40 MG tablet, TAKE 1 TABLET BY MOUTH DAILY, Disp: 90 tablet, Rfl: 0 .  predniSONE (DELTASONE) 10 MG tablet, Take 2.5 mg by mouth daily with breakfast. , Disp: , Rfl: 11 .  promethazine (PHENERGAN) 25 MG tablet, TAKE ONE TABLET BY MOUTH EVERY 8 HOURS AS NEEDED FOR FOR NAUSEA AND VOMITING, Disp: 30 tablet, Rfl: 0 .  temazepam (RESTORIL) 30 MG capsule, Take 30 mg by mouth at bedtime., Disp: , Rfl:   Allergies  Allergen Reactions  . Macrobid [Nitrofurantoin Monohyd Macro] Diarrhea    Diarrhea, GI upset  . Sulfa Antibiotics Rash    Objective:   Pulse 62   SpO2 93%  AAOx3, NAD Obese NCAT, EOMI No obvious CN deficits Coloring WNL Pt is able to speak clearly, coherently without shortness of breath or increased work of breathing.  Thought process is linear.  Mood is appropriate.   Assessment and Plan:   Dizziness/falls- pt reports this is different than her vertigo.  Likely multifactorial given her multiple meds and lack of regular eating.  Her sxs of bending over and then becoming dizzy are consistent w/ orthostatic hypotension.  She is not able to check BP today but at last visit was well controlled.  Will decrease amlodipine to 1/2 tab nightly and hold AM dose.  Encouraged increased water intake and changing positions slowly.  Encouraged her to call both Dr Toy Care and Dr Felecia Shelling about the dizziness for either med adjustments or further evaluation.  Pt expressed understanding and is in agreement w/ plan.    Annye Asa, MD 07/30/2019

## 2019-08-05 ENCOUNTER — Encounter: Payer: Self-pay | Admitting: Family Medicine

## 2019-08-05 DIAGNOSIS — J849 Interstitial pulmonary disease, unspecified: Secondary | ICD-10-CM | POA: Diagnosis not present

## 2019-08-05 DIAGNOSIS — G4733 Obstructive sleep apnea (adult) (pediatric): Secondary | ICD-10-CM | POA: Diagnosis not present

## 2019-08-05 DIAGNOSIS — R062 Wheezing: Secondary | ICD-10-CM | POA: Diagnosis not present

## 2019-08-06 NOTE — Unmapped (Signed)
Amber Bush reports things are stable on her mycophenolate and prednisone. Her outside providers have adjusted some medications due to increased dizziness, but this has since resolved. No issues identified.       [facility] Clinical Assessment & Refill Coordination Note    [name], DOB: 1956-09-15  Phone: [phone] (home)     All above HIPAA information was verified with patient.     Specialty Medication(s):   Inflammatory Disorders: mycophenolate     Current Outpatient Medications   Medication Sig Dispense Refill   ??? amLODIPine (NORVASC) 5 MG tablet Take 2.5 mg by mouth daily. 2.5 mg QHS     ??? ALPRAZolam (XANAX) 1 MG tablet Take 2 mg by mouth nightly. 1/2 tab in the morning.     ??? biotin 10,000 mcg cap Take 10,000 mcg by mouth Two (2) times a day.     ??? cetirizine (ZYRTEC) 10 MG tablet Take 10 mg by mouth daily.     ??? fenofibrate (LOFIBRA) 160 MG tablet Take 160 mg by mouth daily.     ??? FLUoxetine (PROZAC) 40 MG capsule Take 40 mg by mouth daily. 40 mg once daily     ??? fluticasone (FLONASE) 50 mcg/actuation nasal spray 1 spray by Each Nare route daily.     ??? lamoTRIgine (LAMICTAL) 100 MG tablet Take 100 mg by mouth daily. Taking 1/2 tablet (50 mg) once daily     ??? lisinopril (PRINIVIL,ZESTRIL) 20 MG tablet Take 20 mg by mouth Two (2) times a day.      ??? mycophenolate (CELLCEPT) 500 mg tablet Take 1 tablet (500 mg total) by mouth Two (2) times a day. 60 tablet 11   ??? predniSONE (DELTASONE) 2.5 MG tablet Take 1 tablet (2.5 mg total) by mouth daily. 30 tablet 11   ??? promethazine (PHENERGAN) 25 MG tablet Take 25 mg by mouth every six (6) hours as needed for nausea.     ??? temAZEpam (RESTORIL) 30 mg capsule Take 30 mg by mouth nightly as needed for sleep.       No current facility-administered medications for this visit.         Changes to medications: Amber Bush reports no changes at this time.    Allergies   Allergen Reactions   ??? Sulfa (Sulfonamide Antibiotics) Rash ??? Sulfasalazine Rash       Changes to allergies: No    SPECIALTY MEDICATION ADHERENCE     Mycophenolate ~ 14 days remaining  Medication Adherence    Patient reported X missed doses in the last month: 0  Specialty Medication: mycophenolate  Patient is on additional specialty medications: No          Specialty medication(s) dose(s) confirmed: Regimen is correct and unchanged.     Are there any concerns with adherence? No    Adherence counseling provided? Not needed    CLINICAL MANAGEMENT AND INTERVENTION      Clinical Benefit Assessment:    Do you feel the medicine is effective or helping your condition? Yes    Clinical Benefit counseling provided? Not needed    Adverse Effects Assessment:    Are you experiencing any side effects? No    Are you experiencing difficulty administering your medicine? No    Quality of Life Assessment:    How many days over the past month did your ILD  keep you from your normal activities? For example, brushing your teeth or getting up in the morning. 0  Have you discussed this with your provider? Not needed    Therapy Appropriateness:    Is therapy appropriate? Yes, therapy is appropriate and should be continued    DISEASE/MEDICATION-SPECIFIC INFORMATION      N/A    PATIENT SPECIFIC NEEDS     ? Does the patient have any physical, cognitive, or cultural barriers? No    ? Is the patient high risk? No     ? Does the patient require a Care Management Plan? No     ? Does the patient require physician intervention or other additional services (i.e. nutrition, smoking cessation, social work)? No      SHIPPING     Specialty Medication(s) to be Shipped:   Inflammatory Disorders: mycophenolate    Other medication(s) to be shipped: NA     Changes to insurance: No    Delivery Scheduled: Yes, Expected medication delivery date: Tues, Dec 1.     Medication will be delivered via UPS to the confirmed prescription address in [facility]. The patient will receive a drug information handout for each medication shipped and additional FDA Medication Guides as required.  Verified that patient has previously received a Conservation officer, historic buildings.    All of the patient's questions and concerns have been addressed.    Lanney Gins   [facility] Specialty Pharmacist

## 2019-08-08 DIAGNOSIS — G4733 Obstructive sleep apnea (adult) (pediatric): Secondary | ICD-10-CM | POA: Diagnosis not present

## 2019-08-13 ENCOUNTER — Other Ambulatory Visit: Payer: Self-pay | Admitting: Family Medicine

## 2019-08-16 MED FILL — MYCOPHENOLATE MOFETIL 500 MG TABLET: ORAL | 30 days supply | Qty: 60 | Fill #5

## 2019-08-22 ENCOUNTER — Other Ambulatory Visit: Payer: Medicare HMO

## 2019-08-25 ENCOUNTER — Other Ambulatory Visit: Payer: Self-pay | Admitting: Family Medicine

## 2019-08-28 ENCOUNTER — Other Ambulatory Visit: Payer: Medicare HMO

## 2019-08-29 ENCOUNTER — Encounter (INDEPENDENT_AMBULATORY_CARE_PROVIDER_SITE_OTHER): Payer: Medicare HMO | Admitting: Family Medicine

## 2019-08-29 DIAGNOSIS — N390 Urinary tract infection, site not specified: Secondary | ICD-10-CM

## 2019-08-30 MED ORDER — CEPHALEXIN 500 MG PO CAPS: 500.0000 mg | ORAL_CAPSULE | Freq: Two times a day (BID) | ORAL | 0 refills | Status: AC

## 2019-08-30 NOTE — Telephone Encounter (Signed)
Cumulative Time: 8 minutes Consent: Pt reached out via MyChart and gave consent to proceed People Involved: Patient, Keane Scrape B (CMA), myself CC: repeat UTI HPI: pt reports she is again having burning w/ urination, frequency and urgency, and her 'tell tale' sign of urine odor.  She is allergic to sulfa and macrobid. AP: UTI.  Start Keflex 500mg  BID x5 days.  If no improvement will need visit w/ UA to ensure appropriate tx.

## 2019-08-31 ENCOUNTER — Encounter: Payer: Self-pay | Admitting: Family Medicine

## 2019-08-31 NOTE — Unmapped (Signed)
[facility] Refill Coordination Note    Specialty Medication(s) to be Shipped:   General Specialty: mycophenolate 500mg     Other medication(s) to be shipped:       Amber Bush, DOB: 09-13-1956  Phone: [phone] (home)       All above HIPAA information was verified with patient.     Was a Nurse, learning disability used for this call? No    Completed refill call assessment today to schedule patient's medication shipment from the [facility] [phone]).       Specialty medication(s) and dose(s) confirmed: Regimen is correct and unchanged.   Changes to medications: Amber Bush reports no changes at this time.  Changes to insurance: No  Questions for the pharmacist: No    Confirmed patient received Welcome Packet with first shipment. The patient will receive a drug information handout for each medication shipped and additional FDA Medication Guides as required.       DISEASE/MEDICATION-SPECIFIC INFORMATION        N/A    SPECIALTY MEDICATION ADHERENCE     Medication Adherence    Patient reported X missed doses in the last month: 0  Specialty Medication: mycophenolate 500mg   Patient is on additional specialty medications: No  Informant: patient                Mycophenolate 500 mg/ml: 14 days of medicine on hand         SHIPPING     Shipping address confirmed in Epic.     Delivery Scheduled: Yes, Expected medication delivery date: 12/29.     Medication will be delivered via UPS to the prescription address in Epic W[facility].    [name]   [facility] Specialty Technician

## 2019-09-01 ENCOUNTER — Other Ambulatory Visit: Payer: Self-pay | Admitting: Family Medicine

## 2019-09-02 DIAGNOSIS — M545 Low back pain: Secondary | ICD-10-CM | POA: Diagnosis not present

## 2019-09-02 DIAGNOSIS — M25551 Pain in right hip: Secondary | ICD-10-CM | POA: Diagnosis not present

## 2019-09-02 DIAGNOSIS — M25562 Pain in left knee: Secondary | ICD-10-CM | POA: Diagnosis not present

## 2019-09-02 DIAGNOSIS — M25552 Pain in left hip: Secondary | ICD-10-CM | POA: Diagnosis not present

## 2019-09-03 ENCOUNTER — Other Ambulatory Visit: Payer: Self-pay | Admitting: Family Medicine

## 2019-09-04 DIAGNOSIS — R062 Wheezing: Secondary | ICD-10-CM | POA: Diagnosis not present

## 2019-09-04 DIAGNOSIS — G4733 Obstructive sleep apnea (adult) (pediatric): Secondary | ICD-10-CM | POA: Diagnosis not present

## 2019-09-04 DIAGNOSIS — J849 Interstitial pulmonary disease, unspecified: Secondary | ICD-10-CM | POA: Diagnosis not present

## 2019-09-07 DIAGNOSIS — G4733 Obstructive sleep apnea (adult) (pediatric): Secondary | ICD-10-CM | POA: Diagnosis not present

## 2019-09-13 MED FILL — MYCOPHENOLATE MOFETIL 500 MG TABLET: ORAL | 30 days supply | Qty: 60 | Fill #6

## 2019-09-13 MED FILL — MYCOPHENOLATE MOFETIL 500 MG TABLET: 30 days supply | Qty: 60 | Fill #6 | Status: AC

## 2019-09-22 MED ORDER — PREDNISONE 2.5 MG TABLET
ORAL_TABLET | Freq: Every day | ORAL | 11 refills | 30 days | Status: CP
Start: 2019-09-22 — End: 2020-09-21

## 2019-09-27 ENCOUNTER — Telehealth: Payer: Self-pay | Admitting: Pulmonary Disease

## 2019-09-27 DIAGNOSIS — G4733 Obstructive sleep apnea (adult) (pediatric): Secondary | ICD-10-CM

## 2019-09-27 NOTE — Telephone Encounter (Signed)
Spoke with the pt  She states her CPAP machine is overheating and is over 64 y/o  Order sent to [facility] for a new unit  Nothing further needed

## 2019-09-30 NOTE — Unmapped (Signed)
[facility] Refill Coordination Note    Specialty Medication(s) to be Shipped:   General Specialty: mycophenolate 500mg     Other medication(s) to be shipped:       Amber Bush, DOB: Oct 15, 1955  Phone: [phone] (home)       All above HIPAA information was verified with patient.     Was a Nurse, learning disability used for this call? No    Completed refill call assessment today to schedule patient's medication shipment from the [facility] [phone]).       Specialty medication(s) and dose(s) confirmed: Regimen is correct and unchanged.   Changes to medications: Amber Bush reports no changes at this time.  Changes to insurance: No  Questions for the pharmacist: No    Confirmed patient received Welcome Packet with first shipment. The patient will receive a drug information handout for each medication shipped and additional FDA Medication Guides as required.       DISEASE/MEDICATION-SPECIFIC INFORMATION        N/A    SPECIALTY MEDICATION ADHERENCE     Medication Adherence    Patient reported X missed doses in the last month: 0  Specialty Medication: mycophenolate 500mg   Patient is on additional specialty medications: No  Informant: patient                mycophenolate 500 mg: 14 days of medicine on hand         SHIPPING     Shipping address confirmed in Epic.     Delivery Scheduled: Yes, Expected medication delivery date: 01/26.     Medication will be delivered via UPS to the prescription address in Epic WAM.    Amber Bush   [facility] Specialty Technician

## 2019-10-05 ENCOUNTER — Encounter: Payer: Self-pay | Admitting: Family Medicine

## 2019-10-05 DIAGNOSIS — J849 Interstitial pulmonary disease, unspecified: Secondary | ICD-10-CM | POA: Diagnosis not present

## 2019-10-05 DIAGNOSIS — R062 Wheezing: Secondary | ICD-10-CM | POA: Diagnosis not present

## 2019-10-05 DIAGNOSIS — G4733 Obstructive sleep apnea (adult) (pediatric): Secondary | ICD-10-CM | POA: Diagnosis not present

## 2019-10-06 DIAGNOSIS — J849 Interstitial pulmonary disease, unspecified: Secondary | ICD-10-CM | POA: Diagnosis not present

## 2019-10-06 DIAGNOSIS — R062 Wheezing: Secondary | ICD-10-CM | POA: Diagnosis not present

## 2019-10-06 DIAGNOSIS — G4733 Obstructive sleep apnea (adult) (pediatric): Secondary | ICD-10-CM | POA: Diagnosis not present

## 2019-10-11 MED FILL — MYCOPHENOLATE MOFETIL 500 MG TABLET: 30 days supply | Qty: 60 | Fill #7 | Status: AC

## 2019-10-11 MED FILL — MYCOPHENOLATE MOFETIL 500 MG TABLET: ORAL | 30 days supply | Qty: 60 | Fill #7

## 2019-10-18 ENCOUNTER — Other Ambulatory Visit: Payer: Self-pay | Admitting: Family Medicine

## 2019-11-01 ENCOUNTER — Other Ambulatory Visit: Payer: Self-pay

## 2019-11-01 ENCOUNTER — Ambulatory Visit (INDEPENDENT_AMBULATORY_CARE_PROVIDER_SITE_OTHER): Payer: Medicare HMO | Admitting: Physician Assistant

## 2019-11-01 ENCOUNTER — Encounter: Payer: Self-pay | Admitting: Physician Assistant

## 2019-11-01 VITALS — BP 140/82 | HR 86

## 2019-11-01 DIAGNOSIS — H9209 Otalgia, unspecified ear: Secondary | ICD-10-CM | POA: Diagnosis not present

## 2019-11-01 DIAGNOSIS — R509 Fever, unspecified: Secondary | ICD-10-CM | POA: Diagnosis not present

## 2019-11-01 DIAGNOSIS — J3489 Other specified disorders of nose and nasal sinuses: Secondary | ICD-10-CM

## 2019-11-01 DIAGNOSIS — R070 Pain in throat: Secondary | ICD-10-CM

## 2019-11-01 DIAGNOSIS — R519 Headache, unspecified: Secondary | ICD-10-CM | POA: Diagnosis not present

## 2019-11-01 DIAGNOSIS — M791 Myalgia, unspecified site: Secondary | ICD-10-CM | POA: Diagnosis not present

## 2019-11-01 DIAGNOSIS — R6889 Other general symptoms and signs: Secondary | ICD-10-CM

## 2019-11-01 MED ORDER — AZITHROMYCIN 250 MG PO TABS: ORAL_TABLET | ORAL | 0 refills | Status: DC

## 2019-11-01 NOTE — Progress Notes (Signed)
Virtual Visit via Video   I connected with [name] on 11/01/19 at  1:20 PM EST by a video enabled telemedicine application and verified that I am speaking with the correct person using two identifiers. Location patient: Home Location provider: [facility], Office Persons participating in the virtual visit: [place], [place] PA-C, Anselmo Pickler, LPN   I discussed the limitations of evaluation and management by telemedicine and the availability of in person appointments. The patient expressed understanding and agreed to proceed.  I acted as a Education administrator for [employer], PA-C [employer],  LPN  Subjective:   HPI:   Patient is requesting evaluation for possible COVID-19.  Symptom onset: Friday  Travel/contacts: No travel or exposure  Patient endorses the following symptoms: Fever of 101 over the weekend, 100 today. Endorses sinus headache, rhinorrhea, itchy watery eyes, ear pain, sore throat, shortness of breath and myalgias. She has oxygen for prn use, put it on this morning because she felt SOB. Currently at 2 Liters/min. Tells me while we are on the call that her pulse ox reads: 96% SpO2 and HR 86. Has the flu in the past and she feels like she possibly has that. Is due for her second COVID vaccine tomorrow but has cancelled her appointment. Poor appetite but she is hydrating well.  Currently on cellcept for her ILD as well as 2.5 mg prednisone daily (cannot tolerate higher doses because they "make me crazy")  Patient denies the following symptoms: sinus pain, wheezing, chest tightness and chest pain  Treatments tried: Tylenol  Patient risk factors: Current [id number] risk of complications score: 5 Smoking status: Stacey Alvarez  reports that she has quit smoking. She has never used smokeless tobacco. If female, currently pregnant? []   Yes [x]   No  ROS: See pertinent positives and negatives per HPI.  Patient Active Problem List   Diagnosis Date Noted    . History of pulmonary embolus (PE) 06/23/2019  . Dyslipidemia 06/23/2019  . Moderate recurrent major depression ([place]) 06/14/2019  . Family history of cerebral aneurysm 10/29/2018  . Syncope 10/29/2018  . Closed left ankle fracture 09/13/2018  . Vertigo 08/26/2018  . ILD (interstitial lung disease) ([place]) 10/01/2016  . Atherosclerosis of native coronary artery without angina pectoris 10/01/2016  . Chronic cough 04/05/2016  . Incidental lung nodule, > 8mm and < 5mm 04/05/2016  . History of sleep apnea 04/05/2016  . Back pain 02/07/2016  . Morbid obesity ([place]) 11/24/2015  . Hyperlipidemia 05/24/2015  . Diverticulitis 01/09/2015  . Routine general medical examination at a health care facility 11/21/2014  . Essential hypertension 10/28/2013  . OSA (obstructive sleep apnea) 10/28/2013  . Insulin resistance 10/28/2013  . GERD (gastroesophageal reflux disease) 10/28/2013  . Postmenopausal HRT (hormone replacement therapy) 10/28/2013    Social History   Tobacco Use  . Smoking status: Former Research scientist (life sciences)  . Smokeless tobacco: Never Used  . Tobacco comment: smoked 2 years in late teens about 8-10 cigs daily.   Substance Use Topics  . Alcohol use: No    Alcohol/week: 0.0 standard drinks    Comment: OCC    Current Outpatient Medications:  .  ALPRAZolam (XANAX) 1 MG tablet, Take 0.5-2 mg by mouth See admin instructions. Take 1tablet every morning and take 2 tablets at bedtime, Disp: , Rfl:  .  amLODipine (NORVASC) 5 MG tablet, TAKE 1/2 TABLET (2.5 MG) IN THE MORNING AND 1 WHOLE TABLET (5 MG) AT BEDTIME. (Patient taking differently: 1 WHOLE TABLET (5 MG) AT BEDTIME.),  Disp: 135 tablet, Rfl: 3 .  butalbital-aspirin-caffeine-codeine (FIORINAL WITH CODEINE) 50-325-40-30 MG capsule, TAKE ONE CAPSULE BY MOUTH EVERY 8 HOURS AS NEEDED FOR MIGRAINE, Disp: 21 capsule, Rfl: 0 .  cetirizine (ZYRTEC) 10 MG tablet, Take 10 mg by mouth daily., Disp: , Rfl:  .  fenofibrate 160 MG tablet, TAKE ONE TABLET BY  MOUTH DAILY, Disp: 90 tablet, Rfl: 0 .  FLUoxetine (PROZAC) 20 MG tablet, Take 20 mg by mouth daily. Take with 40mg  for a total of 60mg  daily, Disp: , Rfl:  .  FLUoxetine (PROZAC) 40 MG capsule, Take 40 mg by mouth daily. Take with 20mg  for a total of 60mg  daily, Disp: , Rfl: 4 .  fluticasone (FLONASE) 50 MCG/ACT nasal spray, Place 1 spray into both nostrils daily., Disp: , Rfl:  .  lamoTRIgine (LAMICTAL) 100 MG tablet, Take 150 mg by mouth daily. , Disp: , Rfl:  .  lisinopril (ZESTRIL) 20 MG tablet, TAKE ONE TABLET BY MOUTH DAILY, Disp: 180 tablet, Rfl: 0 .  mycophenolate (CELLCEPT) 500 MG tablet, Take 500 mg by mouth 2 (two) times daily. , Disp: , Rfl:  .  OXYGEN, Inhale 2 L into the lungs as needed (SOB)., Disp: , Rfl:  .  pantoprazole (PROTONIX) 40 MG tablet, TAKE ONE TABLET BY MOUTH TWICE A DAY, Disp: 180 tablet, Rfl: 1 .  pravastatin (PRAVACHOL) 40 MG tablet, TAKE ONE TABLET BY MOUTH DAILY, Disp: 90 tablet, Rfl: 0 .  predniSONE (DELTASONE) 10 MG tablet, Take 2.5 mg by mouth daily with breakfast. , Disp: , Rfl: 11 .  promethazine (PHENERGAN) 25 MG tablet, TAKE ONE TABLET BY MOUTH EVERY 8 HOURS AS NEEDED FOR FOR NAUSEA AND VOMITING, Disp: 30 tablet, Rfl: 0 .  temazepam (RESTORIL) 30 MG capsule, Take 30 mg by mouth at bedtime., Disp: , Rfl:  .  azithromycin (ZITHROMAX) 250 MG tablet, Take two tablets on day 1, then one daily x 4 days, Disp: 6 tablet, Rfl: 0  Allergies  Allergen Reactions  . Macrobid [Nitrofurantoin Monohyd Macro] Diarrhea    Diarrhea, GI upset  . Sulfa Antibiotics Rash    Objective:   VITALS: Per patient if applicable, see vitals. GENERAL: Alert, appears well and in no acute distress. HEENT: Atraumatic, conjunctiva clear, no obvious abnormalities on inspection of external nose and ears. NECK: Normal movements of the head and neck. CARDIOPULMONARY: No increased WOB. Speaking in clear sentences. I:E ratio WNL.  MS: Moves all visible extremities without noticeable  abnormality. PSYCH: Pleasant and cooperative, well-groomed. Speech normal rate and rhythm. Affect is appropriate. Insight and judgement are appropriate. Attention is focused, linear, and appropriate.  NEURO: CN grossly intact. Oriented as arrived to appointment on time with no prompting. Moves both UE equally.  SKIN: No obvious lesions, wounds, erythema, or cyanosis noted on face or hands.  Assessment and Plan:   Stacey Alvarez was seen today for covid symptoms.  Diagnoses and all orders for this visit:  Flu-like symptoms  Other orders -     azithromycin (ZITHROMAX) 250 MG tablet; Take two tablets on day 1, then one daily x 4 days  Patient has a respiratory illness without signs of acute distress or respiratory compromise at this time.  Patient refused COVID-19 testing as well as a visit to the [facility] this afternoon for formal lung exam and work-up. I also recommended urgent care however she refused. I discussed with patient that if she has COVID-19 and takes a turn for the worse, she could end up requiring hospitalization,  and given her increased risk of complications, she may die -- patient verbalized understanding and still declined evaluation.   Recommended that if her oxygen levels begin to drop below 94%, she develops worsening SOB, chest pain, or any other concerning symptoms that she should seek emergent treatment. She verbalized understanding and is in agreement to this. She reports limited social support but does have neighbors that check in on her. I have advised her to discuss with her neighbors that she isn't feeling well so they can check in on her.   I will send in Azithromycin oral antibiotic for her trial to cover for possible CAP.  I have asked my nurse to call patient tomorrow to check in on her and see how she is doing.  . Reviewed expectations re: course of current medical issues. . Discussed self-management of symptoms. . Outlined signs and symptoms indicating  need for more acute intervention. . Patient verbalized understanding and all questions were answered. Marland Kitchen Health Maintenance issues including appropriate healthy diet, exercise, and smoking avoidance were discussed with patient. . See orders for this visit as documented in the electronic medical record.  I discussed the assessment and treatment plan with the patient. The patient was provided an opportunity to ask questions and all were answered. The patient agreed with the plan and demonstrated an understanding of the instructions.   The patient was advised to call back or seek an in-person evaluation if the symptoms worsen or if the condition fails to improve as anticipated.   CMA or LPN served as scribe during this visit. History, Physical, and Plan performed by medical provider. The above documentation has been reviewed and is accurate and complete.   [place], [place] 11/01/2019

## 2019-11-02 ENCOUNTER — Telehealth: Payer: Self-pay | Admitting: *Deleted

## 2019-11-02 NOTE — Telephone Encounter (Signed)
Left message on voicemail to call office. Following up from visit yesterday.

## 2019-11-02 NOTE — Telephone Encounter (Signed)
Spoke to pt , asked her how she is feeling? Pt said she is feeling better since started the antibiotic yesterday. Asked pt how her breathing was? Pt said it is better under control. So is it better than yesterday? Pt said yes, she is feeling much better today and headache is gone. Told her okay good, please complete the antibiotics and if any other problems contact your PCP. Pt verbalized understanding.

## 2019-11-05 DIAGNOSIS — J849 Interstitial pulmonary disease, unspecified: Secondary | ICD-10-CM | POA: Diagnosis not present

## 2019-11-05 DIAGNOSIS — R062 Wheezing: Secondary | ICD-10-CM | POA: Diagnosis not present

## 2019-11-05 DIAGNOSIS — G4733 Obstructive sleep apnea (adult) (pediatric): Secondary | ICD-10-CM | POA: Diagnosis not present

## 2019-11-06 DIAGNOSIS — G4733 Obstructive sleep apnea (adult) (pediatric): Secondary | ICD-10-CM | POA: Diagnosis not present

## 2019-11-06 DIAGNOSIS — R062 Wheezing: Secondary | ICD-10-CM | POA: Diagnosis not present

## 2019-11-06 DIAGNOSIS — J849 Interstitial pulmonary disease, unspecified: Secondary | ICD-10-CM | POA: Diagnosis not present

## 2019-11-07 ENCOUNTER — Encounter: Payer: Self-pay | Admitting: Family Medicine

## 2019-11-08 ENCOUNTER — Other Ambulatory Visit: Payer: Self-pay | Admitting: General Practice

## 2019-11-08 MED ORDER — LISINOPRIL 20 MG PO TABS: 20.0000 mg | ORAL_TABLET | Freq: Every day | ORAL | 0 refills | Status: DC

## 2019-11-08 NOTE — Unmapped (Signed)
[facility] Refill Coordination Note    Specialty Medication(s) to be Shipped:   General Specialty: mycophenolate 500mg     Other medication(s) to be shipped: N/A     Amber Bush, DOB: [birth year]  Phone: [phone] (home)       All above HIPAA information was verified with patient.     Was a Nurse, learning disability used for this call? No    Completed refill call assessment today to schedule patient's medication shipment from the [facility] [phone]).       Specialty medication(s) and dose(s) confirmed: Regimen is correct and unchanged.   Changes to medications: Amber Bush reports no changes at this time.  Changes to insurance: No  Questions for the pharmacist: No    Confirmed patient received Welcome Packet with first shipment. The patient will receive a drug information handout for each medication shipped and additional FDA Medication Guides as required.       DISEASE/MEDICATION-SPECIFIC INFORMATION        N/A    SPECIALTY MEDICATION ADHERENCE     Medication Adherence    Patient reported X missed doses in the last month: 0  Specialty Medication: Mycophenolate  Patient is on additional specialty medications: No                Mycophenolate 500 mg: 14 days of medicine on hand         SHIPPING     Shipping address confirmed in Epic.     Delivery Scheduled: Yes, Expected medication delivery date: 11/18/19.     Medication will be delivered via UPS to the prescription address in Epic WAM.    Amber Bush [facility] Specialty Technician

## 2019-11-11 ENCOUNTER — Other Ambulatory Visit: Payer: Self-pay | Admitting: Family Medicine

## 2019-11-11 NOTE — Telephone Encounter (Signed)
Last OV 11/01/19 [facility] last filled 09/02/19 #21 with 0

## 2019-11-17 MED FILL — MYCOPHENOLATE MOFETIL 500 MG TABLET: ORAL | 30 days supply | Qty: 60 | Fill #8

## 2019-11-17 MED FILL — MYCOPHENOLATE MOFETIL 500 MG TABLET: 30 days supply | Qty: 60 | Fill #8 | Status: AC

## 2019-11-23 ENCOUNTER — Ambulatory Visit (INDEPENDENT_AMBULATORY_CARE_PROVIDER_SITE_OTHER): Payer: Medicare HMO | Admitting: Psychiatry

## 2019-11-23 ENCOUNTER — Other Ambulatory Visit: Payer: Self-pay

## 2019-11-23 ENCOUNTER — Encounter: Payer: Self-pay | Admitting: Psychiatry

## 2019-11-23 DIAGNOSIS — F332 Major depressive disorder, recurrent severe without psychotic features: Secondary | ICD-10-CM | POA: Diagnosis not present

## 2019-11-23 DIAGNOSIS — R69 Illness, unspecified: Secondary | ICD-10-CM | POA: Diagnosis not present

## 2019-11-23 DIAGNOSIS — F411 Generalized anxiety disorder: Secondary | ICD-10-CM

## 2019-11-23 DIAGNOSIS — F5101 Primary insomnia: Secondary | ICD-10-CM

## 2019-11-23 MED ORDER — FLUOXETINE HCL 20 MG PO TABS: ORAL_TABLET | ORAL | Status: DC

## 2019-11-23 MED ORDER — VORTIOXETINE HBR 5 MG PO TABS: ORAL_TABLET | ORAL | 0 refills | Status: DC

## 2019-11-23 NOTE — Patient Instructions (Addendum)
Recommend contacting [facility] [phone]  Decrease Prozac to 20 mg daily for one week, then stop.  Start Trintellix 5 mg daily with a meal for one week (while also taking Prozac 20 mg daily), then increase to 10 mg daily.

## 2019-11-23 NOTE — Progress Notes (Signed)
[facility] MD/PA/NP Initial Note  11/23/2019 7:11 PM Stacey Alvarez  MRN:  [id number]  Chief Complaint:  Chief Complaint    Depression; Anxiety      HPI: Patient is a 64 year old female being seen for initial evaluation for anxiety and depression.  She is referred by her therapist Beckey Downing, [facility] whom she has been seen for several years.  Patient has also been seeing Dr. Toy Care for psychiatric medication management. She reports that Dr. Toy Care has discussed switching to Trintellix and pt has some apprehension about this change and cost of medication.   She reports that she has been having worsening anxiety. She reports worry and rumination about events from the past. She will shake and feel a "heavy heart" with anxiety. Denies full panic attacks. Denies any obsessions or compulsions. Denies h/o social anxiety. Reports that she now avoids crowds. She reports intrusive memories about husband's death. Denies nightmares or re-experiencing. Denies exaggerated startle response or hypervigilance.   Reports chronic depression- "it's become my life." Lost her 5 yo brother when she was 80 yo and thinks depression was present then. She reports that she has had short periods of time, 1-2 years in her 51's when depression improved. She reports persistent sad mood and that her mood has continued to worsen since the loss of her son and her husband. Denies persistent irritability. Reports occasional anger about events from the past. Frequent crying episodes.   She reports that she blames herself for "not trying to save" her husband when he died after he left the hospital AMA and collapsed in the shower from suspected MI. Reports excessive guilt. "I have trouble letting go of things I cannot control." Anhedonia and diminished interest in things. Reports poor concentration and will start projects and be unable to complete them. She reports that she takes medications to sleep and typically sleeps about 7 hours and will then  return to bed for another 2-3 hours after taking morning medication. Appetite is poor. Reports that she typically will eat a late lunch and supper. Reports that she lost weight after husband's death and gained it back. Energy has been low. She reports that she has been socially withdrawn and does not want to talk with neighbors. Motivation has been low. She reports that she questions her purpose and has been questioning her faith. Denies suicidal intent since she cannot imagine leaving her daughter.   Denies any past manic s/s. Denies paranoia. Denies AH or VH.   Never had a seizure.  Born and raised in [place]. Has a sister that is 65 years older and they have no communication. Reports that her sister bullied her throughout childhood. Brother died when pt was 26 yo. Reports that her mother was "sickly" and father was a Control and instrumentation engineer." Reports that parents were not emotionally available after the death of her brother. Graduated HS. 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 Stopped work when daughter was born in 68. Has also worked providing office support. Married 40 years and together 42 years. Has a daughter and a son. Son was dx'd with cystic fibrosis. Reports that son had substance dependence. Husband had affair in Dec 18, 2010 and went to counseling and got back together.  Lived in [place] for 35 years with husband's job. Lost husband in 2016-12-17 and lost her son last year. She reports that her husband was going to retire. Father died in 12-17-12. Daughter has been in relationship for 15 years and they do  not want children. Close with daughter who lives in [place]. [place]. Reports that her most supportive friends are in [place] and [place]. Denies any hobbies or interest.   Past Psychiatric Medication Trials: (Reports sensitivity to medication) Lamictal- reports that 150 mg felt that this was too high. Paxil- Took years ago in combination with other  medications Lexapro-ineffective Zoloft- ineffective Prozac- Has tremor and unsteadiness on 80 mg. Has taken about 5 years. Unsure of benefit. Effexor XR- adverse reaction. Confusion. Remeron- Wt gain. Wellbutrin- rash Gabapentin- felt unsteady Temazepam Xanax Lithium-Ineffective Abilify-Ineffective.  Viibryd- Reports that Dr. Toy Care has told her that she was on it and she does not recall this.   Not sure if she has taken Cymbalta.   Visit Diagnosis:    ICD-10-CM   1. Severe episode of recurrent major depressive disorder, without psychotic features ([place])  F33.2   2. Generalized anxiety disorder  F41.1   3. Primary insomnia  F51.01     Past Psychiatric History: Has seen Beckey Downing, [facility] for therapy for several years. Has been seeing Dr. Toy Care. Saw a psychiatrist in [place] in the past and felt "over-medicated" at that time. Saw a grief counselor after husband's death. Denies past psychiatric hospitalization.   Past Medical History:  Past Medical History:  Diagnosis Date  . Allergy   . Anxiety   . Arthritis    KNEES,BACK,HIPS  . Depression   . Fibromyalgia   . GERD (gastroesophageal reflux disease)   . Hyperlipidemia   . Hypertension   . IBS (irritable bowel syndrome)   . Interstitial lung disease ([place])   . Pulmonary embolism ([place]) 09/2016   provoked- s/p rotator cuff repair  . Sleep apnea     Past Surgical History:  Procedure Laterality Date  . CHOLECYSTECTOMY    . COLONOSCOPY    . ORIF ANKLE FRACTURE Left 09/13/2018   Procedure: OPEN REDUCTION INTERNAL FIXATION (ORIF) ANKLE FRACTURE;  Surgeon: Erle Crocker, MD;  Location: [facility];  Service: Orthopedics;  Laterality: Left;  . PARTIAL HYSTERECTOMY  1999  . POLYPECTOMY      Family History:  Family History  Problem Relation Age of Onset  . Diabetes Mother   . Heart disease Mother   . Anxiety disorder Mother   . Depression Mother   . Kidney disease Mother   . Diabetes Father   . Heart disease Father   .  Anxiety disorder Father   . Depression Father   . Diabetes Paternal Grandmother   . Cystic fibrosis Son   . Alcohol abuse Son   . Drug abuse Son   . Anxiety disorder Son   . Depression Son   . Breast cancer Maternal Aunt   . Breast cancer Maternal Aunt     Social History:  Social History   Socioeconomic History  . Marital status: Widowed    Spouse name: Not on file  . Number of children: 2  . Years of education: 56  . Highest education level: High school graduate  Occupational History  . 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 Smoking status: Former Research scientist (life sciences)  . Smokeless tobacco: Never Used  . Tobacco comment: smoked 2 years in late teens about 8-10 cigs daily.   Substance and Sexual Activity  . Alcohol use: No    Alcohol/week: 0.0 standard drinks  . Drug use: No  . Sexual activity: Not on file  Other Topics Concern  . Not on file  Social History Narrative  Lives alone, widowed. Has one dtr. in [place]. [place] and one son in [place]./fim   Social Determinants of Health   Financial Resource Strain:   . Difficulty of Paying Living Expenses: Not on file  Food Insecurity:   . Worried About Charity fundraiser in the Last Year: Not on file  . Ran Out of Food in the Last Year: Not on file  Transportation Needs:   . Lack of Transportation (Medical): Not on file  . Lack of Transportation (Non-Medical): Not on file  Physical Activity:   . Days of Exercise per Week: Not on file  . Minutes of Exercise per Session: Not on file  Stress:   . Feeling of Stress : Not on file  Social Connections:   . Frequency of Communication with Friends and Family: Not on file  . Frequency of Social Gatherings with Friends and Family: Not on file  . Attends Religious Services: Not on file  . Active Member of Clubs or Organizations: Not on file  . Attends Archivist Meetings: Not on file  . Marital Status: Not on file    Allergies:  Allergies  Allergen  Reactions  . Macrobid [Nitrofurantoin Monohyd Macro] Diarrhea    Diarrhea, GI upset  . Sulfa Antibiotics Rash    Metabolic Disorder Labs: Lab Results  Component Value Date   HGBA1C 5.4 06/14/2019   No results found for: PROLACTIN Lab Results  Component Value Date   CHOL 216 (H) 06/14/2019   TRIG 169.0 (H) 06/14/2019   HDL 55.60 06/14/2019   CHOLHDL 4 06/14/2019   VLDL 33.8 06/14/2019   LDLCALC 127 (H) 06/14/2019   LDLCALC 138 (H) 08/26/2018   Lab Results  Component Value Date   TSH 0.60 06/14/2019   TSH 0.71 08/26/2018    Therapeutic Level Labs: No results found for: LITHIUM No results found for: VALPROATE No components found for:  CBMZ  Current Medications: Current Outpatient Medications  Medication Sig Dispense Refill  . ALPRAZolam (XANAX) 1 MG tablet Take 0.5-2 mg by mouth See admin instructions. Take 1tablet every morning and take 2 tablets at bedtime    . amLODipine (NORVASC) 5 MG tablet TAKE 1/2 TABLET (2.5 MG) IN THE MORNING AND 1 WHOLE TABLET (5 MG) AT BEDTIME. (Patient taking differently: 1 WHOLE TABLET (5 MG) AT BEDTIME.) 135 tablet 3  . butalbital-aspirin-caffeine-codeine (FIORINAL WITH CODEINE) 50-325-40-30 MG capsule TAKE ONE CAPSULE BY MOUTH EVERY 8 HOURS AS NEEDED FOR MIGRAINE 21 capsule 0  . cetirizine (ZYRTEC) 10 MG tablet Take 10 mg by mouth daily.    . fenofibrate 160 MG tablet TAKE ONE TABLET BY MOUTH DAILY 90 tablet 0  . FLUoxetine (PROZAC) 20 MG tablet Take 20 mg by mouth daily. Take with 40mg  for a total of 60mg  daily    . FLUoxetine (PROZAC) 40 MG capsule Take 40 mg by mouth daily. Take with 20mg  for a total of 60mg  daily  4  . fluticasone (FLONASE) 50 MCG/ACT nasal spray Place 1 spray into both nostrils daily.    Marland Kitchen lamoTRIgine (LAMICTAL) 100 MG tablet Take 75 mg by mouth daily.     Marland Kitchen lisinopril (ZESTRIL) 20 MG tablet Take 1 tablet (20 mg total) by mouth daily. 180 tablet 0  . mycophenolate (CELLCEPT) 500 MG tablet Take 500 mg by mouth 2 (two)  times daily.     . OXYGEN Inhale 2 L into the lungs as needed (SOB).    . pantoprazole (PROTONIX) 40 MG tablet TAKE ONE TABLET  BY MOUTH TWICE A DAY 180 tablet 1  . pravastatin (PRAVACHOL) 40 MG tablet TAKE ONE TABLET BY MOUTH DAILY 90 tablet 0  . predniSONE (DELTASONE) 10 MG tablet Take 2.5 mg by mouth daily with breakfast.   11  . temazepam (RESTORIL) 30 MG capsule Take 30 mg by mouth at bedtime.    Marland Kitchen azithromycin (ZITHROMAX) 250 MG tablet Take two tablets on day 1, then one daily x 4 days (Patient not taking: Reported on 11/23/2019) 6 tablet 0  . promethazine (PHENERGAN) 25 MG tablet TAKE ONE TABLET BY MOUTH EVERY 8 HOURS AS NEEDED FOR FOR NAUSEA AND VOMITING 30 tablet 0   No current facility-administered medications for this visit.    Medication Side Effects: none  Orders placed this visit:  No orders of the defined types were placed in this encounter.   Psychiatric Specialty Exam:  Review of Systems  Constitutional: Positive for diaphoresis.  HENT: Negative.   Eyes: Negative.   Respiratory: Positive for shortness of breath.        Uses cPap  Cardiovascular: Negative.   Gastrointestinal: Negative.   Endocrine: Negative.   Genitourinary: Negative.   Musculoskeletal: Negative.   Allergic/Immunologic: Negative.   Neurological: Positive for dizziness and headaches.  Hematological: Negative.   Psychiatric/Behavioral:       Please refer to HPI    There were no vitals taken for this visit.There is no height or weight on file to calculate BMI.  General Appearance: Casual  Eye Contact:  Fair  Speech:  Clear and Coherent and Slow  Volume:  Decreased  Mood:  Anxious and Depressed  Affect:  Blunt and Depressed  Thought Process:  Coherent, Linear and Descriptions of Associations: Intact  Orientation:  Full (Time, Place, and Person)  Thought Content: Logical, Hallucinations: None and Rumination   Suicidal Thoughts:  No  Homicidal Thoughts:  No  Memory:  WNL  Judgement:  Good   Insight:  Good  Psychomotor Activity:  Decreased  Concentration:  Concentration: Fair and Attention Span: Fair  Recall:  Good  Fund of Knowledge: Good  Language: Good  Assets:  Resilience Social Support  ADL's:  Intact  Cognition: WNL  Prognosis:  Fair   Screenings:  PHQ2-9     Office Visit from 07/30/2019 in [facility] Office Visit from 06/14/2019 in [facility] Office Visit from 04/12/2019 in [facility] Office Visit from 08/26/2018 in [facility] Office Visit from 05/20/2018 in [facility]  PHQ-2 Total Score  0  3  2  0  6  PHQ-9 Total Score  0  3  3  0  21      Receiving Psychotherapy: Yes   Treatment Plan/Recommendations: Case staffed with Dr. Clovis Pu.  Discussed potential benefits, risks, and side effects of Trintellix.  Patient agrees to trial of Trintellix.  Discussed cross titrating medications to minimize risk of side effects and discontinuation signs and symptoms. Will decrease Prozac to 20 mg x 1 week, then stop.  Will start Trintellix 5 mg po qd x 1 week, then increase to 10 mg po qd. Discussed potential benefits of [facility] and provided patient with referral information.  Discussed that [facility] can be helpful for patients whose depression has not responded to medication and/or patients that have had tolerability issues with medications.  Recommend continuing psychotherapy with Beckey Downing, [facility].  Patient to follow-up with this provider in 4 to 5 weeks  or sooner if clinically indicated. Patient advised to contact office with any questions, adverse effects, or acute worsening in signs and symptoms.    Thayer Headings, PMHNP

## 2019-11-24 ENCOUNTER — Telehealth: Payer: Self-pay | Admitting: Psychiatry

## 2019-11-24 NOTE — Telephone Encounter (Signed)
Pt was seen in office on yesterday. She received an AVS but is still requesting clarification on the tapering of her medication.

## 2019-11-25 NOTE — Telephone Encounter (Signed)
RTC to patient and went over instructions again about Prozac and Trintellix from last office visit. Patient verbalized understanding.

## 2019-11-29 ENCOUNTER — Telehealth: Payer: Self-pay | Admitting: Psychiatry

## 2019-11-29 NOTE — Telephone Encounter (Signed)
Patient called and stated she is having MRI's done and wants to know if that will interfere with her doing the magnetic testing. She is requesting a call back from J. Eulas Post.

## 2019-12-01 ENCOUNTER — Other Ambulatory Visit: Payer: Medicare HMO

## 2019-12-02 ENCOUNTER — Telehealth: Payer: Self-pay | Admitting: Neurology

## 2019-12-02 NOTE — Telephone Encounter (Signed)
[employer] medicare Josem [id number] (exp. 12/02/19 to 2020/06/10) that is good for CPT codes [place], [place] & [address]. Patient is scheduled at GI for 12/18/19.

## 2019-12-03 DIAGNOSIS — R062 Wheezing: Secondary | ICD-10-CM | POA: Diagnosis not present

## 2019-12-03 DIAGNOSIS — G4733 Obstructive sleep apnea (adult) (pediatric): Secondary | ICD-10-CM | POA: Diagnosis not present

## 2019-12-03 DIAGNOSIS — J849 Interstitial pulmonary disease, unspecified: Secondary | ICD-10-CM | POA: Diagnosis not present

## 2019-12-04 DIAGNOSIS — R062 Wheezing: Secondary | ICD-10-CM | POA: Diagnosis not present

## 2019-12-04 DIAGNOSIS — J849 Interstitial pulmonary disease, unspecified: Secondary | ICD-10-CM | POA: Diagnosis not present

## 2019-12-04 DIAGNOSIS — G4733 Obstructive sleep apnea (adult) (pediatric): Secondary | ICD-10-CM | POA: Diagnosis not present

## 2019-12-07 DIAGNOSIS — R69 Illness, unspecified: Secondary | ICD-10-CM | POA: Diagnosis not present

## 2019-12-13 NOTE — Unmapped (Addendum)
[facility] Refill Coordination Note    Specialty Medication(s) to be Shipped:   General Specialty: mycophenolate 500mg     Other medication(s) to be shipped: N/A     Amber Bush, DOB: 1956-04-08  Phone: [phone] (home)       All above HIPAA information was verified with patient.     Was a Nurse, learning disability used for this call? No    Completed refill call assessment today to schedule patient's medication shipment from the [facility] [phone]).       Specialty medication(s) and dose(s) confirmed: Regimen is correct and unchanged.   Changes to medications: Amber Bush reports starting the following medications: Trintellix  Changes to insurance: No  Questions for the pharmacist: No    Confirmed patient received Welcome Packet with first shipment. The patient will receive a drug information handout for each medication shipped and additional FDA Medication Guides as required.       DISEASE/MEDICATION-SPECIFIC INFORMATION        N/A    SPECIALTY MEDICATION ADHERENCE     Medication Adherence    Patient reported X missed doses in the last month: 0  Specialty Medication: Mycophenolate 500mg   Patient is on additional specialty medications: No                Mycophenolate 500 mg: 7 days of medicine on hand       SHIPPING     Shipping address confirmed in Epic.     Delivery Scheduled: Yes, Expected medication delivery date: 12/16/19.     Medication will be delivered via UPS to the prescription address in Epic WAM.    Nancy Nordmann [facility] Specialty Technician

## 2019-12-14 DIAGNOSIS — R69 Illness, unspecified: Secondary | ICD-10-CM | POA: Diagnosis not present

## 2019-12-15 ENCOUNTER — Telehealth: Payer: Self-pay | Admitting: Family Medicine

## 2019-12-15 DIAGNOSIS — R69 Illness, unspecified: Secondary | ICD-10-CM | POA: Diagnosis not present

## 2019-12-15 MED FILL — MYCOPHENOLATE MOFETIL 500 MG TABLET: ORAL | 30 days supply | Qty: 60 | Fill #9

## 2019-12-15 MED FILL — MYCOPHENOLATE MOFETIL 500 MG TABLET: 30 days supply | Qty: 60 | Fill #9 | Status: AC

## 2019-12-15 NOTE — Progress Notes (Signed)
  Chronic Care Management   Outreach Note  12/15/2019 Name: Stacey Alvarez MRN: [id number] DOB: [birth year]  Referred by: Midge Minium, MD Reason for referral : No chief complaint on file.   An unsuccessful telephone outreach was attempted today. The patient was referred to the pharmacist for assistance with care management and care coordination.   Follow Up Plan:   Earney [place] Upstream Scheduler

## 2019-12-16 DIAGNOSIS — R69 Illness, unspecified: Secondary | ICD-10-CM | POA: Diagnosis not present

## 2019-12-17 ENCOUNTER — Ambulatory Visit
Admission: RE | Admit: 2019-12-17 | Discharge: 2019-12-17 | Disposition: A | Discharge status: Home / Self Care | Payer: Medicare HMO | Source: Ambulatory Visit | Attending: Neurology | Admitting: Neurology

## 2019-12-17 DIAGNOSIS — Z8249 Family history of ischemic heart disease and other diseases of the circulatory system: Secondary | ICD-10-CM

## 2019-12-17 DIAGNOSIS — R55 Syncope and collapse: Secondary | ICD-10-CM | POA: Diagnosis not present

## 2019-12-17 DIAGNOSIS — R69 Illness, unspecified: Secondary | ICD-10-CM | POA: Diagnosis not present

## 2019-12-18 ENCOUNTER — Ambulatory Visit
Admission: RE | Admit: 2019-12-18 | Discharge: 2019-12-18 | Disposition: A | Discharge status: Home / Self Care | Payer: Medicare HMO | Source: Ambulatory Visit | Attending: Neurology | Admitting: Neurology

## 2019-12-18 DIAGNOSIS — Z8249 Family history of ischemic heart disease and other diseases of the circulatory system: Secondary | ICD-10-CM

## 2019-12-18 DIAGNOSIS — R55 Syncope and collapse: Secondary | ICD-10-CM

## 2019-12-18 MED ORDER — GADOBENATE DIMEGLUMINE 529 MG/ML IV SOLN
20.0000 mL | Freq: Once | INTRAVENOUS | Status: AC | PRN
  Administered 2019-12-18: 20 mL via INTRAVENOUS

## 2019-12-20 DIAGNOSIS — R69 Illness, unspecified: Secondary | ICD-10-CM | POA: Diagnosis not present

## 2019-12-21 ENCOUNTER — Other Ambulatory Visit: Payer: Self-pay

## 2019-12-21 ENCOUNTER — Ambulatory Visit (INDEPENDENT_AMBULATORY_CARE_PROVIDER_SITE_OTHER): Payer: Medicare HMO | Admitting: Psychiatry

## 2019-12-21 ENCOUNTER — Encounter: Payer: Self-pay | Admitting: Psychiatry

## 2019-12-21 DIAGNOSIS — F5101 Primary insomnia: Secondary | ICD-10-CM

## 2019-12-21 DIAGNOSIS — F411 Generalized anxiety disorder: Secondary | ICD-10-CM | POA: Diagnosis not present

## 2019-12-21 DIAGNOSIS — F332 Major depressive disorder, recurrent severe without psychotic features: Secondary | ICD-10-CM | POA: Diagnosis not present

## 2019-12-21 DIAGNOSIS — R69 Illness, unspecified: Secondary | ICD-10-CM | POA: Diagnosis not present

## 2019-12-21 NOTE — Progress Notes (Signed)
Roderica Mahin [id number] [birth year] 64 y.o.  Subjective:   Patient ID:  Margot Pfingsten is a 64 y.o. (DOB Feb 04, 1956) female.  Chief Complaint:  Chief Complaint  Patient presents with  . Follow-up    Depression, Anxiety    HPI Niema Holwerda presents to the office today for follow-up of depression and anxiety. She is accompanied by friend Hoyle Sauer. She reports that she started [place] a week ago and had 7th tx of TMS this morning. Reports that she is not crying as often. Pt reports improved mood. Friend noticed some improvement in mood about 7-10 days ago.  Pt reports that she is still having significant anxiety. Reports that she has some rumination at night and that this is not as severe. Worry has been ok.   She reports that Trintelix may be causing sleepiness. Taking naps in the afternoon. She reports that her motivation has improved some and has done some things she has not been doing in the past (Getting house painted, cleaning out home, pursuing patient assistance). Reports that she has difficulty getting motivated to exercise. She reports that she is thinking about the future more and is wanting to do some things. She reports that she has been sleeping ok except for pain. She reports that she does not eat much, "but what I eat is not good for me." She reports that her concentration and focus has improved some. She reports significant improvement in passive death wishes. Denies SI.   She reports that the holidays remain difficult for her due to losses.   Past Psychiatric Medication Trials: (Reports sensitivity to medication) Lamictal- reports that 150 mg felt that this was too high. Paxil- Took years ago in combination with other medications Lexapro-ineffective Zoloft- ineffective Prozac- Has tremor and unsteadiness on 80 mg. Has taken about 5 years. Unsure of benefit. Effexor XR- adverse reaction. Confusion. Remeron- Wt gain. Wellbutrin- rash Gabapentin- felt  unsteady Temazepam Xanax Lithium-Ineffective Abilify-Ineffective.  Viibryd- Reports that Dr. Toy Care has told her that she was on it and she does not recall this.   Not sure if she has taken Cymbalta.   PHQ2-9     Office Visit from 07/30/2019 in [facility] Office Visit from 06/14/2019 in [facility] Office Visit from 04/12/2019 in [facility] Office Visit from 08/26/2018 in [facility] Office Visit from 05/20/2018 in [facility]  PHQ-2 Total Score  0  3  2  0  6  PHQ-9 Total Score  0  3  3  0  21       Review of Systems:  Review of Systems  Musculoskeletal: Positive for back pain. Negative for gait problem.       Has been having some bursitis pain  Neurological: Negative for tremors.  Psychiatric/Behavioral:       Please refer to HPI    Medications: I have reviewed the patient's current medications.  Current Outpatient Medications  Medication Sig Dispense Refill  . ALPRAZolam (XANAX) 1 MG tablet Take 0.5-2 mg by mouth See admin instructions. Take 1tablet every morning and take 2 tablets at bedtime    . amLODipine (NORVASC) 5 MG tablet TAKE 1/2 TABLET (2.5 MG) IN THE MORNING AND 1 WHOLE TABLET (5 MG) AT BEDTIME. (Patient taking differently: 1 WHOLE TABLET (5 MG) AT BEDTIME.) 135 tablet 3  . butalbital-aspirin-caffeine-codeine (FIORINAL WITH CODEINE) 50-325-40-30 MG capsule TAKE ONE CAPSULE BY MOUTH EVERY 8 HOURS AS NEEDED FOR  MIGRAINE 21 capsule 0  . cetirizine (ZYRTEC) 10 MG tablet Take 10 mg by mouth daily.    . fenofibrate 160 MG tablet TAKE ONE TABLET BY MOUTH DAILY 90 tablet 0  . fluticasone (FLONASE) 50 MCG/ACT nasal spray Place 1 spray into both nostrils daily.    Marland Kitchen lamoTRIgine (LAMICTAL) 100 MG tablet Take 75 mg by mouth daily.     Marland Kitchen lisinopril (ZESTRIL) 20 MG tablet Take 1 tablet  (20 mg total) by mouth daily. 180 tablet 0  . mycophenolate (CELLCEPT) 500 MG tablet Take 500 mg by mouth 2 (two) times daily.     . pantoprazole (PROTONIX) 40 MG tablet TAKE ONE TABLET BY MOUTH TWICE A DAY 180 tablet 1  . pravastatin (PRAVACHOL) 40 MG tablet TAKE ONE TABLET BY MOUTH DAILY 90 tablet 0  . predniSONE (DELTASONE) 10 MG tablet Take 2.5 mg by mouth daily with breakfast.   11  . promethazine (PHENERGAN) 25 MG tablet TAKE ONE TABLET BY MOUTH EVERY 8 HOURS AS NEEDED FOR FOR NAUSEA AND VOMITING 30 tablet 0  . temazepam (RESTORIL) 30 MG capsule Take 30 mg by mouth at bedtime.    . vortioxetine HBr (TRINTELLIX) 10 MG TABS tablet Take 1 tablet (10 mg total) by mouth daily. 30 tablet 0  . azithromycin (ZITHROMAX) 250 MG tablet Take two tablets on day 1, then one daily x 4 days (Patient not taking: Reported on 11/23/2019) 6 tablet 0  . OXYGEN Inhale 2 L into the lungs as needed (SOB).     No current facility-administered medications for this visit.    Medication Side Effects: Other: Sleepiness  Allergies:  Allergies  Allergen Reactions  . Macrobid [Nitrofurantoin Monohyd Macro] Diarrhea    Diarrhea, GI upset  . Sulfa Antibiotics Rash    Past Medical History:  Diagnosis Date  . Allergy   . Anxiety   . Arthritis    KNEES,BACK,HIPS  . Depression   . Fibromyalgia   . GERD (gastroesophageal reflux disease)   . Hyperlipidemia   . Hypertension   . IBS (irritable bowel syndrome)   . Interstitial lung disease ([place])   . Pulmonary embolism ([place]) 09/2016   provoked- s/p rotator cuff repair  . Sleep apnea     Family History  Problem Relation Age of Onset  . Diabetes Mother   . Heart disease Mother   . Anxiety disorder Mother   . Depression Mother   . Kidney disease Mother   . Diabetes Father   . Heart disease Father   . Anxiety disorder Father   . Depression Father   . Diabetes Paternal Grandmother   . Cystic fibrosis Son   . Alcohol abuse Son   . Drug abuse Son   .  Anxiety disorder Son   . Depression Son   . Breast cancer Maternal Aunt   . Breast cancer Maternal Aunt     Social History   Socioeconomic History  . Marital status: Widowed    Spouse name: Not on file  . Number of children: 2  . Years of education: 89  . Highest education level: High school graduate  Occupational History  . 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 Smoking status: Former Research scientist (life sciences)  . Smokeless tobacco: Never Used  . Tobacco comment: smoked 2 years in late teens about 8-10 cigs daily.   Substance and Sexual Activity  . Alcohol use: No    Alcohol/week: 0.0 standard drinks  . Drug use: No  .  Sexual activity: Not on file  Other Topics Concern  . Not on file  Social History Narrative   Lives alone, widowed. Has one dtr. in [place]. [place] and one son in [place]./fim   Social Determinants of Health   Financial Resource Strain:   . Difficulty of Paying Living Expenses:   Food Insecurity:   . Worried About Charity fundraiser in the Last Year:   . Arboriculturist in the Last Year:   Transportation Needs:   . Film/video editor (Medical):   Marland Kitchen Lack of Transportation (Non-Medical):   Physical Activity:   . Days of Exercise per Week:   . Minutes of Exercise per Session:   Stress:   . Feeling of Stress :   Social Connections:   . Frequency of Communication with Friends and Family:   . Frequency of Social Gatherings with Friends and Family:   . Attends Religious Services:   . Active Member of Clubs or Organizations:   . Attends Archivist Meetings:   Marland Kitchen Marital Status:   Intimate Partner Violence:   . Fear of Current or Ex-Partner:   . Emotionally Abused:   Marland Kitchen Physically Abused:   . Sexually Abused:     Past Medical History, Surgical history, Social history, and Family history were reviewed and updated as appropriate.   Please see review of systems for further details on the patient's review from today.   Objective:    Physical Exam:  There were no vitals taken for this visit.  Physical Exam Constitutional:      General: She is not in acute distress. Musculoskeletal:        General: No deformity.  Neurological:     Mental Status: She is alert and oriented to person, place, and time.     Coordination: Coordination normal.  Psychiatric:        Attention and Perception: Attention and perception normal. She does not perceive auditory or visual hallucinations.        Mood and Affect: Affect is not labile, blunt, angry or inappropriate.        Speech: Speech normal.        Behavior: Behavior normal.        Thought Content: Thought content normal. Thought content is not paranoid or delusional. Thought content does not include homicidal or suicidal ideation. Thought content does not include homicidal or suicidal plan.        Cognition and Memory: Cognition and memory normal.        Judgment: Judgment normal.     Comments: Insight intact Mood presents as less depressed and less anxious     Lab Review:     Component Value Date/Time   NA 138 06/14/2019 1509   K 4.7 06/14/2019 1509   CL 100 06/14/2019 1509   CO2 28 06/14/2019 1509   GLUCOSE 98 06/14/2019 1509   BUN 16 06/14/2019 1509   CREATININE 0.75 06/14/2019 1509   CALCIUM 10.1 06/14/2019 1509   PROT 7.1 06/14/2019 1509   ALBUMIN 4.6 06/14/2019 1509   AST 10 06/14/2019 1509   ALT 9 06/14/2019 1509   ALKPHOS 65 06/14/2019 1509   BILITOT 0.5 06/14/2019 1509   GFRNONAA >60 09/13/2018 0255   GFRAA >60 09/13/2018 0255       Component Value Date/Time   WBC 8.5 06/14/2019 1509   RBC 4.67 06/14/2019 1509   HGB 13.1 06/14/2019 1509   HCT 39.0 06/14/2019 1509   PLT 329.0 06/14/2019  1509   MCV 83.6 06/14/2019 1509   MCH 27.4 09/15/2018 0808   MCHC 33.4 06/14/2019 1509   RDW 14.3 06/14/2019 1509   LYMPHSABS 1.6 06/14/2019 1509   MONOABS 0.4 06/14/2019 1509   EOSABS 0.2 06/14/2019 1509   BASOSABS 0.1 06/14/2019 1509    No results found  for: POCLITH, LITHIUM   No results found for: PHENYTOIN, PHENOBARB, VALPROATE, CBMZ   .res Assessment: Plan:   Discussed option of increasing Trintellix to 20 mg po qd or continuing 10 mg po qd since pt may continue to experience some benefit from recent initiation of Trintellix. She prefers to continue Trintellix 10 mg po qd at this time and re-evaluate in 4 weeks. Will continue Trintellix 10 mg po qd. Pt has initiated Pt Assistance process for Trintellix. Pt provided with additional samples until medication becomes available through Pt Assistance. Recommend conitnuing [place] and psychotherapy with Beckey Downing, [facility]. Pt to f/u in 4 weeks or sooner if clinically indicated.  Patient advised to contact office with any questions, adverse effects, or acute worsening in signs and symptoms.  Star was seen today for follow-up.  Diagnoses and all orders for this visit:  Severe episode of recurrent major depressive disorder, without psychotic features ([place]) -     vortioxetine HBr (TRINTELLIX) 10 MG TABS tablet; Take 1 tablet (10 mg total) by mouth daily.  Generalized anxiety disorder -     vortioxetine HBr (TRINTELLIX) 10 MG TABS tablet; Take 1 tablet (10 mg total) by mouth daily.  Primary insomnia     Please see After Visit Summary for patient specific instructions.  Future Appointments  Date Time Provider [place]  01/18/2020 10:30 AM Thayer Headings, PMHNP CP-CP None    No orders of the defined types were placed in this encounter.   -------------------------------

## 2019-12-22 DIAGNOSIS — R69 Illness, unspecified: Secondary | ICD-10-CM | POA: Diagnosis not present

## 2019-12-22 MED ORDER — VORTIOXETINE HBR 10 MG PO TABS: 10.0000 mg | ORAL_TABLET | Freq: Every day | ORAL | 0 refills | Status: DC

## 2019-12-23 ENCOUNTER — Telehealth: Payer: Self-pay | Admitting: Family Medicine

## 2019-12-23 DIAGNOSIS — R69 Illness, unspecified: Secondary | ICD-10-CM | POA: Diagnosis not present

## 2019-12-23 NOTE — Progress Notes (Signed)
  Chronic Care Management   Outreach Note  12/23/2019 Name: Stacey Alvarez MRN: [id number] DOB: [birth year]  Referred by: Midge Minium, MD Reason for referral : No chief complaint on file.   A second unsuccessful telephone outreach was attempted today. The patient was referred to pharmacist for assistance with care management and care coordination.  Follow Up Plan:   Earney Hamburg Upstream Scheduler

## 2019-12-24 DIAGNOSIS — R69 Illness, unspecified: Secondary | ICD-10-CM | POA: Diagnosis not present

## 2019-12-27 ENCOUNTER — Other Ambulatory Visit: Payer: Self-pay | Admitting: Family Medicine

## 2019-12-27 DIAGNOSIS — R69 Illness, unspecified: Secondary | ICD-10-CM | POA: Diagnosis not present

## 2019-12-28 ENCOUNTER — Telehealth (INDEPENDENT_AMBULATORY_CARE_PROVIDER_SITE_OTHER): Payer: Medicare HMO | Admitting: Physician Assistant

## 2019-12-28 ENCOUNTER — Other Ambulatory Visit: Payer: Self-pay

## 2019-12-28 ENCOUNTER — Encounter: Payer: Self-pay | Admitting: Physician Assistant

## 2019-12-28 VITALS — BP 144/80 | HR 75 | Ht 64.0 in

## 2019-12-28 DIAGNOSIS — K5790 Diverticulosis of intestine, part unspecified, without perforation or abscess without bleeding: Secondary | ICD-10-CM

## 2019-12-28 DIAGNOSIS — R300 Dysuria: Secondary | ICD-10-CM | POA: Diagnosis not present

## 2019-12-28 MED ORDER — CIPROFLOXACIN HCL 500 MG PO TABS: 500.0000 mg | ORAL_TABLET | Freq: Two times a day (BID) | ORAL | 0 refills | Status: DC

## 2019-12-28 NOTE — Patient Instructions (Signed)
Instructions sent to MyChart

## 2019-12-28 NOTE — Progress Notes (Signed)
Virtual Visit via Telephone Note  I connected with Stacey Alvarez on 12/28/19 at  8:30 AM EDT by telephone and verified that I am speaking with the correct person using two identifiers.  Location: Patient: Home Provider: [facility]   I discussed the limitations, risks, security and privacy concerns of performing an evaluation and management service by telephone and the availability of in person appointments. I also discussed with the patient that there may be a patient responsible charge related to this service. The patient expressed understanding and agreed to proceed.  History of Present Illness: Patient presents via phone today (cannot connect to video platform) complaining of 3 days of urinary urgency, frequency, cloudy urine, foul-smelling urine and dysuria.  Denies any hematuria.  Notes some suprapubic pressure.  Denies new onset back pain.  Denies fever, chills.  Notes some mild nausea but denies vomiting.  Has been taking Azo with some relief of symptoms.   Patient also with history of lumbar arthritis, followed by Dr. Berenice Primas.  Has been having significant increase in pain recently and as such has been taking NSAIDs for relief.  Is concerned about a mild flareup of her diverticulitis as she has noted some left lower quadrant discomfort over the past couple days with nausea.  Denies any significant fluctuations to bowel habits.  Denies melena, hematochezia or tenesmus.  Again denies fever or chills.  Has been very consistent with diet.   Observations/Objective: No labored breathing.  Speech is clear and coherent with logical content.  Patient is alert and oriented at baseline.   Assessment and Plan: 1. Dysuria Classic symptoms of uncomplicated cystitis.  Feel she can be treated via phone today.  We will have her continue to stay well-hydrated.  Can continue Azo as needed.  Consider cranberry supplement.  Will Rx Cipro as this will cover UTI as well as mild flare of diverticulitis.  Strict  return precautions reviewed.  Also strict ER precautions reviewed with patient.  2. Diverticular disease Mild.  Significant history of sigmoid diverticulitis.  Notes symptoms are improved from yesterday.  We will have her continue to watch her diet and keep well-hydrated.  Have started her on Cipro for UTI.  If symptoms are not easing up by completion of course of antibiotic or if anything worsens she is to let us know so we can add on metronidazole versus getting her in for office visit or imaging.  ER precautions reviewed with patient.   Follow Up Instructions: I discussed the assessment and treatment plan with the patient. The patient was provided an opportunity to ask questions and all were answered. The patient agreed with the plan and demonstrated an understanding of the instructions.   The patient was advised to call back or seek an in-person evaluation if the symptoms worsen or if the condition fails to improve as anticipated.  I provided 12 minutes of non-face-to-face time during this encounter.   Leeanne Rio, PA-C

## 2019-12-29 ENCOUNTER — Other Ambulatory Visit: Payer: Self-pay | Admitting: Family Medicine

## 2019-12-30 DIAGNOSIS — R69 Illness, unspecified: Secondary | ICD-10-CM | POA: Diagnosis not present

## 2019-12-31 ENCOUNTER — Telehealth: Payer: Self-pay | Admitting: Psychiatry

## 2019-12-31 NOTE — Telephone Encounter (Signed)
Returned call to pt. She reports, "I've had 2 horrible days." She reports that she was shaky. She reports that Fridays are usually the hardest days since her husband died at home on a 12/31/22. She reports that she has had low energy and motivation. She has had some increased anxiety. Reports passive death wishes. Denies SI.   Continues to have [place] treatments.   Plan: Increase Trintellix to two 10 mg tabs daily for mood and anxiety. Continue with [place] treatment.

## 2019-12-31 NOTE — Telephone Encounter (Signed)
Pt called stating Stacey Alvarez talked about increasing her Trintellix. She states things are worse in the last 3 days. Please call.

## 2020-01-03 DIAGNOSIS — R69 Illness, unspecified: Secondary | ICD-10-CM | POA: Diagnosis not present

## 2020-01-03 DIAGNOSIS — R062 Wheezing: Secondary | ICD-10-CM | POA: Diagnosis not present

## 2020-01-03 DIAGNOSIS — G4733 Obstructive sleep apnea (adult) (pediatric): Secondary | ICD-10-CM | POA: Diagnosis not present

## 2020-01-03 DIAGNOSIS — J849 Interstitial pulmonary disease, unspecified: Secondary | ICD-10-CM | POA: Diagnosis not present

## 2020-01-04 DIAGNOSIS — J849 Interstitial pulmonary disease, unspecified: Secondary | ICD-10-CM | POA: Diagnosis not present

## 2020-01-04 DIAGNOSIS — G4733 Obstructive sleep apnea (adult) (pediatric): Secondary | ICD-10-CM | POA: Diagnosis not present

## 2020-01-04 DIAGNOSIS — R69 Illness, unspecified: Secondary | ICD-10-CM | POA: Diagnosis not present

## 2020-01-04 DIAGNOSIS — R062 Wheezing: Secondary | ICD-10-CM | POA: Diagnosis not present

## 2020-01-05 ENCOUNTER — Telehealth: Payer: Self-pay | Admitting: Family Medicine

## 2020-01-05 DIAGNOSIS — R69 Illness, unspecified: Secondary | ICD-10-CM | POA: Diagnosis not present

## 2020-01-05 NOTE — Progress Notes (Signed)
°  Chronic Care Management   Outreach Note  01/05/2020 Name: Stacey Alvarez MRN: [id number] DOB: 11-05-1955  Referred by: Midge Minium, MD Reason for referral : No chief complaint on file.   An unsuccessful telephone outreach was attempted today. The patient was referred to the pharmacist for assistance with care management and care coordination.   Follow Up Plan:   Earney Hamburg Upstream Scheduler

## 2020-01-06 ENCOUNTER — Telehealth: Payer: Self-pay | Admitting: Family Medicine

## 2020-01-06 NOTE — Progress Notes (Signed)
  Chronic Care Management   Note  01/06/2020 Name: Rhia Meakin MRN: [id number] DOB: March 29, 1956  Jolea Oramas is a 64 y.o. year old female who is a primary care patient of Birdie Riddle, Aundra Millet, MD. I reached out to [employer] by phone today in response to a referral sent by Ms. Maelin Kneebone's PCP, Midge Minium, MD.   Ms. Bibee was given information about Chronic Care Management services today including:  1. CCM service includes personalized support from designated clinical staff supervised by her physician, including individualized plan of care and coordination with other care providers 2. 24/7 contact phone numbers for assistance for urgent and routine care needs. 3. Service will only be billed when office clinical staff spend 20 minutes or more in a month to coordinate care. 4. Only one practitioner may furnish and bill the service in a calendar month. 5. The patient may stop CCM services at any time (effective at the end of the month) by phone call to the office staff.   Patient agreed to services and verbal consent obtained.   Follow up plan:   Earney Hamburg Upstream Scheduler

## 2020-01-10 DIAGNOSIS — R69 Illness, unspecified: Secondary | ICD-10-CM | POA: Diagnosis not present

## 2020-01-11 DIAGNOSIS — R69 Illness, unspecified: Secondary | ICD-10-CM | POA: Diagnosis not present

## 2020-01-12 DIAGNOSIS — R69 Illness, unspecified: Secondary | ICD-10-CM | POA: Diagnosis not present

## 2020-01-13 DIAGNOSIS — R69 Illness, unspecified: Secondary | ICD-10-CM | POA: Diagnosis not present

## 2020-01-17 NOTE — Unmapped (Signed)
[facility] Clinical Assessment & Refill Coordination Note    Amber Bush, DOB: 06/26/1956  Phone: [phone] (home)     All above HIPAA information was verified with patient.     Was a Nurse, learning disability used for this call? No    Specialty Medication(s):   General Specialty: mycophenolate 500mg      Current Outpatient Medications   Medication Sig Dispense Refill   ??? ALPRAZolam (XANAX) 1 MG tablet Take 2 mg by mouth nightly. 1/2 tab in the morning.     ??? amLODIPine (NORVASC) 5 MG tablet Take 2.5 mg by mouth daily. 2.5 mg QHS     ??? biotin 10,000 mcg cap Take 10,000 mcg by mouth Two (2) times a day.     ??? cetirizine (ZYRTEC) 10 MG tablet Take 10 mg by mouth daily.     ??? fenofibrate (LOFIBRA) 160 MG tablet Take 160 mg by mouth daily.     ??? FLUoxetine (PROZAC) 40 MG capsule Take 40 mg by mouth daily. 40 mg once daily     ??? fluticasone (FLONASE) 50 mcg/actuation nasal spray 1 spray by Each Nare route daily.     ??? lamoTRIgine (LAMICTAL) 100 MG tablet Take 100 mg by mouth daily. Taking 1/2 tablet (50 mg) once daily     ??? lisinopril (PRINIVIL,ZESTRIL) 20 MG tablet Take 20 mg by mouth Two (2) times a day.      ??? mycophenolate (CELLCEPT) 500 mg tablet Take 1 tablet (500 mg total) by mouth Two (2) times a day. 60 tablet 11   ??? predniSONE (DELTASONE) 2.5 MG tablet Take 1 tablet (2.5 mg total) by mouth daily. 30 tablet 11   ??? promethazine (PHENERGAN) 25 MG tablet Take 25 mg by mouth every six (6) hours as needed for nausea.     ??? temAZEpam (RESTORIL) 30 mg capsule Take 30 mg by mouth nightly as needed for sleep.       No current facility-administered medications for this visit.        Changes to medications: Amber Bush Reports stopping the following medications: patient has stopped fluoxetine and started Trintelix    Allergies   Allergen Reactions   ??? Sulfa (Sulfonamide Antibiotics) Rash   ??? Sulfasalazine Rash       Changes to allergies: No    SPECIALTY MEDICATION ADHERENCE     mycophenolate 500 mg: 11 days of medicine on hand       Medication Adherence    Patient reported X missed doses in the last month: 0  Specialty Medication: Mycophenolate 500 mg  Patient is on additional specialty medications: No  Informant: patient  Confirmed plan for next specialty medication refill: delivery by pharmacy  Refills needed for supportive medications: not needed          Specialty medication(s) dose(s) confirmed: Regimen is correct and unchanged.     Are there any concerns with adherence? No    Adherence counseling provided? Not needed    CLINICAL MANAGEMENT AND INTERVENTION      Clinical Benefit Assessment:    Do you feel the medicine is effective or helping your condition? Yes    Clinical Benefit counseling provided? Not needed    Adverse Effects Assessment:    Are you experiencing any side effects? No    Are you experiencing difficulty administering your medicine? No    Quality of Life Assessment:    How many days over the past month did your ILD  keep you from your  normal activities? For example, brushing your teeth or getting up in the morning. 1 - has been moving around a lot today, but otherwise has not had to use her supplemental oxygen.    Have you discussed this with your provider? Not needed    Therapy Appropriateness:    Is therapy appropriate? Yes, therapy is appropriate and should be continued    DISEASE/MEDICATION-SPECIFIC INFORMATION      N/A    PATIENT SPECIFIC NEEDS     - Does the patient have any physical, cognitive, or cultural barriers? No    - Is the patient high risk? No     - Does the patient require a Care Management Plan? No     - Does the patient require physician intervention or other additional services (i.e. nutrition, smoking cessation, social work)? No      SHIPPING     Specialty Medication(s) to be Shipped:   General Specialty: mycophenolate 500mg     Other medication(s) to be shipped: n/a     Changes to insurance: No    Delivery Scheduled: Yes, Expected medication delivery date: 5/11. Medication will be delivered via UPS to the confirmed prescription address in [facility].    The patient will receive a drug information handout for each medication shipped and additional FDA Medication Guides as required.  Verified that patient has previously received a Conservation officer, historic buildings.    All of the patient's questions and concerns have been addressed.    Clydell Hakim   [facility] Specialty Pharmacist

## 2020-01-18 ENCOUNTER — Other Ambulatory Visit: Payer: Self-pay

## 2020-01-18 ENCOUNTER — Encounter: Payer: Self-pay | Admitting: Physician Assistant

## 2020-01-18 ENCOUNTER — Other Ambulatory Visit: Payer: Self-pay | Admitting: Physician Assistant

## 2020-01-18 ENCOUNTER — Telehealth: Payer: Self-pay | Admitting: Family Medicine

## 2020-01-18 ENCOUNTER — Telehealth: Payer: Self-pay

## 2020-01-18 ENCOUNTER — Telehealth (INDEPENDENT_AMBULATORY_CARE_PROVIDER_SITE_OTHER): Payer: Medicare HMO | Admitting: Physician Assistant

## 2020-01-18 ENCOUNTER — Ambulatory Visit: Payer: Medicare HMO | Admitting: Psychiatry

## 2020-01-18 VITALS — HR 89

## 2020-01-18 DIAGNOSIS — K529 Noninfective gastroenteritis and colitis, unspecified: Secondary | ICD-10-CM | POA: Diagnosis not present

## 2020-01-18 MED ORDER — PROMETHAZINE HCL 25 MG PO TABS: ORAL_TABLET | ORAL | 0 refills | Status: DC

## 2020-01-18 NOTE — Telephone Encounter (Signed)
Medication sent to patient's pharmacy. Patient notified.

## 2020-01-18 NOTE — Patient Instructions (Signed)
Instructions sent to MyChart

## 2020-01-18 NOTE — Telephone Encounter (Signed)
I would tell her to hold the lisinopril until her vomiting stops. Ok to take other medications.

## 2020-01-18 NOTE — Progress Notes (Signed)
Virtual Visit via Video   I connected with patient on 01/18/20 at  8:30 AM EDT by a video enabled telemedicine application and verified that I am speaking with the correct person using two identifiers.  Location patient: Home Location provider: [facility], Office Persons participating in the virtual visit: Patient, Provider, [place] (Patina Moore)  I discussed the limitations of evaluation and management by telemedicine and the availability of in person appointments. The patient expressed understanding and agreed to proceed.  Subjective:   HPI:   Patient presents via [facility] today 1 day of nausea with nonbloody emesis and several episodes of nonbloody diarrhea, starting yesterday evening.  Notes symptoms were severe throughout the night but have improved.  Initially had a fever last night with T-max of 101.  Has resolved without medication.  Notes some mild abdominal cramping but denies overt abdominal pain.  Denies any change in urinary symptoms.  Denies recent travel or sick contact.  Denies eating any abnormal or new foods.  Has been taking her Phenergan and some OTC Imodium which has been helping with her symptoms.  Denies any chest pain, shortness of breath or preceding respiratory symptoms.  ROS:   See pertinent positives and negatives per HPI.  Patient Active Problem List   Diagnosis Date Noted  . History of pulmonary embolus (PE) 06/23/2019  . Dyslipidemia 06/23/2019  . Moderate recurrent major depression ([place]) 06/14/2019  . Family history of cerebral aneurysm 10/29/2018  . Syncope 10/29/2018  . Closed left ankle fracture 09/13/2018  . Vertigo 08/26/2018  . ILD (interstitial lung disease) ([place]) 10/01/2016  . Atherosclerosis of native coronary artery without angina pectoris 10/01/2016  . Chronic cough 04/05/2016  . Incidental lung nodule, > 16mm and < 64mm 04/05/2016  . History of sleep apnea 04/05/2016  . Back pain 02/07/2016  . Morbid obesity ([place]) 11/24/2015    . Hyperlipidemia 05/24/2015  . Diverticulitis 01/09/2015  . Routine general medical examination at a health care facility 11/21/2014  . Essential hypertension 10/28/2013  . OSA (obstructive sleep apnea) 10/28/2013  . Insulin resistance 10/28/2013  . GERD (gastroesophageal reflux disease) 10/28/2013  . Postmenopausal HRT (hormone replacement therapy) 10/28/2013    Social History   Tobacco Use  . Smoking status: Former Research scientist (life sciences)  . Smokeless tobacco: Never Used  . Tobacco comment: smoked 2 years in late teens about 8-10 cigs daily.   Substance Use Topics  . Alcohol use: No    Alcohol/week: 0.0 standard drinks    Current Outpatient Medications:  .  ALPRAZolam (XANAX) 1 MG tablet, Take 0.5-2 mg by mouth See admin instructions. Take 1tablet every morning and take 2 tablets at bedtime, Disp: , Rfl:  .  amLODipine (NORVASC) 5 MG tablet, TAKE 1/2 TABLET (2.5 MG) IN THE MORNING AND 1 WHOLE TABLET (5 MG) AT BEDTIME. (Patient taking differently: 1 WHOLE TABLET (5 MG) AT BEDTIME.), Disp: 135 tablet, Rfl: 3 .  azithromycin (ZITHROMAX) 250 MG tablet, Take two tablets on day 1, then one daily x 4 days (Patient not taking: Reported on 11/23/2019), Disp: 6 tablet, Rfl: 0 .  butalbital-aspirin-caffeine-codeine (FIORINAL WITH CODEINE) 50-325-40-30 MG capsule, TAKE ONE CAPSULE BY MOUTH EVERY 8 HOURS AS NEEDED FOR MIGRAINE, Disp: 21 capsule, Rfl: 0 .  cetirizine (ZYRTEC) 10 MG tablet, Take 10 mg by mouth daily., Disp: , Rfl:  .  ciprofloxacin (CIPRO) 500 MG tablet, Take 1 tablet (500 mg total) by mouth 2 (two) times daily., Disp: 14 tablet, Rfl: 0 .  fenofibrate 160 MG tablet,  TAKE ONE TABLET BY MOUTH DAILY, Disp: 90 tablet, Rfl: 0 .  fluticasone (FLONASE) 50 MCG/ACT nasal spray, Place 1 spray into both nostrils daily., Disp: , Rfl:  .  lamoTRIgine (LAMICTAL) 100 MG tablet, Take 75 mg by mouth daily. , Disp: , Rfl:  .  lisinopril (ZESTRIL) 20 MG tablet, Take 1 tablet (20 mg total) by mouth daily., Disp: 180  tablet, Rfl: 0 .  mycophenolate (CELLCEPT) 500 MG tablet, Take 500 mg by mouth 2 (two) times daily. , Disp: , Rfl:  .  OXYGEN, Inhale 2 L into the lungs as needed (SOB)., Disp: , Rfl:  .  pantoprazole (PROTONIX) 40 MG tablet, TAKE ONE TABLET BY MOUTH TWICE A DAY, Disp: 180 tablet, Rfl: 1 .  pravastatin (PRAVACHOL) 40 MG tablet, TAKE ONE TABLET BY MOUTH DAILY, Disp: 90 tablet, Rfl: 0 .  predniSONE (DELTASONE) 10 MG tablet, Take 0.5 mg by mouth daily with breakfast. , Disp: , Rfl: 11 .  promethazine (PHENERGAN) 25 MG tablet, TAKE ONE TABLET BY MOUTH EVERY 8 HOURS AS NEEDED FOR FOR NAUSEA AND VOMITING, Disp: 30 tablet, Rfl: 0 .  temazepam (RESTORIL) 30 MG capsule, Take 30 mg by mouth at bedtime., Disp: , Rfl:  .  vortioxetine HBr (TRINTELLIX) 10 MG TABS tablet, Take 1 tablet (10 mg total) by mouth daily., Disp: 30 tablet, Rfl: 0  Allergies  Allergen Reactions  . Macrobid [Nitrofurantoin Monohyd Macro] Diarrhea    Diarrhea, GI upset  . Sulfa Antibiotics Rash    Objective:   There were no vitals taken for this visit.  Patient is well-developed, well-nourished in no acute distress.  Resting comfortably on bed at home.  Head is normocephalic, atraumatic.  No labored breathing.  Speech is clear and coherent with logical content.  Patient is alert and oriented at baseline.   Assessment and Plan:   1. Gastroenteritis Seems most likely viral in nature giving sudden onset and short course before initial improvement in symptoms.  Discussed with patient that the most important thing is for her to keep well-hydrated.  Recommendations reviewed.  If she is unable to keep p.o. fluids in her system, she is to seek care at nearest emergency room.  Okay to continue Imodium and Phenergan.  Brat diet reviewed with patient.  Copy of written instruction were sent to patient's MyChart.  Patient voiced understanding and agreement with plan.  Leeanne Rio, PA-C 01/18/2020

## 2020-01-18 NOTE — Telephone Encounter (Signed)
On current med list. Ok to send refill. Quant 30

## 2020-01-18 NOTE — Telephone Encounter (Signed)
Patient had a virtual visit today. She states the phenergan was not called into her pharmacy. Patient states she uses the [facility].

## 2020-01-18 NOTE — Telephone Encounter (Signed)
Pt called in asking if it ok to take her morning meds.

## 2020-01-18 NOTE — Progress Notes (Signed)
I have discussed the procedure for the virtual visit with the patient who has given consent to proceed with assessment and treatment.   Stacey Alvarez N Alixander Rallis, LPN    . 

## 2020-01-18 NOTE — Telephone Encounter (Signed)
Called patient to inform. Patient aware and voiced understanding.

## 2020-01-21 ENCOUNTER — Other Ambulatory Visit: Payer: Self-pay | Admitting: Family Medicine

## 2020-01-21 ENCOUNTER — Telehealth: Payer: Self-pay | Admitting: Psychiatry

## 2020-01-21 DIAGNOSIS — F5101 Primary insomnia: Secondary | ICD-10-CM

## 2020-01-21 MED ORDER — TEMAZEPAM 30 MG PO CAPS: 30.0000 mg | ORAL_CAPSULE | Freq: Every day | ORAL | 0 refills | Status: DC

## 2020-01-21 NOTE — Telephone Encounter (Signed)
Last OV 01/18/20 [facility] last filled 11/11/19 #6 with 0

## 2020-01-21 NOTE — Telephone Encounter (Signed)
Noa called to report that she is not tolerating the increase of the trintellix.  She is very nauseous and can't take anything for nausea due to other health issues.  Please call to discuss.  Remember that she is on patient assistance for this medication so any changes in dose need to be reported to them so they send her the correct dosing when time to refill.

## 2020-01-21 NOTE — Telephone Encounter (Signed)
Returned call to pt. She reports that she had GI illness earlier this week and cannot take Phenergan due to SOB. Increased Trintellix to 20 mg about 1.5 weeks before stomach illness. Reports that acute GI s/s have resolved but continues to have constant nausea. Has not been able to eat solid foods and discussed that risk of nausea was likely increased with taking Trintellix on an empty stomach.   She reports that she did not go to [facility] this week due to GI illness. Has not been crying as much. Has been looking forward to things more. Has been taking care of some things around the house that she had been putting off for several years.   Discussed option to decrease to 10 mg if nausea is severe. Pt reports that she will continue 20 mg daily for now and try to take it with food. Advised pt to call back if unable to tolerate higher dose of Trintellix.

## 2020-01-21 NOTE — Telephone Encounter (Signed)
Patient called and said that she needs a script of temazapam 30mg  to be sent to the [facility] at [place] on [address]. She had ana ppt on 5/19. She said that you are transferring all her meds to you now and this is one you have not done before

## 2020-01-24 ENCOUNTER — Telehealth: Payer: Self-pay | Admitting: Psychiatry

## 2020-01-24 MED FILL — MYCOPHENOLATE MOFETIL 500 MG TABLET: ORAL | 30 days supply | Qty: 60 | Fill #10

## 2020-01-24 MED FILL — MYCOPHENOLATE MOFETIL 500 MG TABLET: 30 days supply | Qty: 60 | Fill #10 | Status: AC

## 2020-01-24 NOTE — Telephone Encounter (Signed)
Having an issue with her medications. Would like a call back [phone]

## 2020-01-24 NOTE — Telephone Encounter (Signed)
Left message to call back with her issues.

## 2020-01-24 NOTE — Telephone Encounter (Signed)
Patient given information and agrees. Advised to call back with any issues prior to her apt.

## 2020-01-25 DIAGNOSIS — R69 Illness, unspecified: Secondary | ICD-10-CM | POA: Diagnosis not present

## 2020-01-26 ENCOUNTER — Other Ambulatory Visit: Payer: Self-pay | Admitting: General Practice

## 2020-01-26 DIAGNOSIS — K219 Gastro-esophageal reflux disease without esophagitis: Secondary | ICD-10-CM

## 2020-01-26 DIAGNOSIS — E785 Hyperlipidemia, unspecified: Secondary | ICD-10-CM

## 2020-01-26 DIAGNOSIS — I10 Essential (primary) hypertension: Secondary | ICD-10-CM

## 2020-01-26 DIAGNOSIS — R69 Illness, unspecified: Secondary | ICD-10-CM | POA: Diagnosis not present

## 2020-01-27 DIAGNOSIS — H2513 Age-related nuclear cataract, bilateral: Secondary | ICD-10-CM | POA: Diagnosis not present

## 2020-01-27 DIAGNOSIS — R69 Illness, unspecified: Secondary | ICD-10-CM | POA: Diagnosis not present

## 2020-01-27 DIAGNOSIS — H04123 Dry eye syndrome of bilateral lacrimal glands: Secondary | ICD-10-CM | POA: Diagnosis not present

## 2020-01-27 DIAGNOSIS — L720 Epidermal cyst: Secondary | ICD-10-CM | POA: Diagnosis not present

## 2020-01-31 DIAGNOSIS — R69 Illness, unspecified: Secondary | ICD-10-CM | POA: Diagnosis not present

## 2020-02-01 DIAGNOSIS — R69 Illness, unspecified: Secondary | ICD-10-CM | POA: Diagnosis not present

## 2020-02-02 ENCOUNTER — Ambulatory Visit: Payer: Medicare HMO | Admitting: Psychiatry

## 2020-02-02 ENCOUNTER — Other Ambulatory Visit: Payer: Self-pay

## 2020-02-02 DIAGNOSIS — R69 Illness, unspecified: Secondary | ICD-10-CM | POA: Diagnosis not present

## 2020-02-02 DIAGNOSIS — J849 Interstitial pulmonary disease, unspecified: Secondary | ICD-10-CM | POA: Diagnosis not present

## 2020-02-02 DIAGNOSIS — G4733 Obstructive sleep apnea (adult) (pediatric): Secondary | ICD-10-CM | POA: Diagnosis not present

## 2020-02-02 DIAGNOSIS — R062 Wheezing: Secondary | ICD-10-CM | POA: Diagnosis not present

## 2020-02-03 DIAGNOSIS — R062 Wheezing: Secondary | ICD-10-CM | POA: Diagnosis not present

## 2020-02-03 DIAGNOSIS — G4733 Obstructive sleep apnea (adult) (pediatric): Secondary | ICD-10-CM | POA: Diagnosis not present

## 2020-02-03 DIAGNOSIS — J849 Interstitial pulmonary disease, unspecified: Secondary | ICD-10-CM | POA: Diagnosis not present

## 2020-02-04 DIAGNOSIS — R69 Illness, unspecified: Secondary | ICD-10-CM | POA: Diagnosis not present

## 2020-02-07 DIAGNOSIS — R69 Illness, unspecified: Secondary | ICD-10-CM | POA: Diagnosis not present

## 2020-02-09 DIAGNOSIS — R69 Illness, unspecified: Secondary | ICD-10-CM | POA: Diagnosis not present

## 2020-02-10 DIAGNOSIS — R69 Illness, unspecified: Secondary | ICD-10-CM | POA: Diagnosis not present

## 2020-02-11 DIAGNOSIS — R69 Illness, unspecified: Secondary | ICD-10-CM | POA: Diagnosis not present

## 2020-02-15 DIAGNOSIS — R69 Illness, unspecified: Secondary | ICD-10-CM | POA: Diagnosis not present

## 2020-02-16 DIAGNOSIS — R69 Illness, unspecified: Secondary | ICD-10-CM | POA: Diagnosis not present

## 2020-02-17 DIAGNOSIS — R69 Illness, unspecified: Secondary | ICD-10-CM | POA: Diagnosis not present

## 2020-02-21 ENCOUNTER — Other Ambulatory Visit: Payer: Self-pay

## 2020-02-21 ENCOUNTER — Ambulatory Visit (INDEPENDENT_AMBULATORY_CARE_PROVIDER_SITE_OTHER): Payer: Medicare HMO | Admitting: Psychiatry

## 2020-02-21 ENCOUNTER — Encounter: Payer: Self-pay | Admitting: Psychiatry

## 2020-02-21 VITALS — BP 143/82 | HR 79

## 2020-02-21 DIAGNOSIS — F332 Major depressive disorder, recurrent severe without psychotic features: Secondary | ICD-10-CM | POA: Diagnosis not present

## 2020-02-21 DIAGNOSIS — F411 Generalized anxiety disorder: Secondary | ICD-10-CM | POA: Diagnosis not present

## 2020-02-21 DIAGNOSIS — R69 Illness, unspecified: Secondary | ICD-10-CM | POA: Diagnosis not present

## 2020-02-21 DIAGNOSIS — G4730 Sleep apnea, unspecified: Secondary | ICD-10-CM | POA: Diagnosis not present

## 2020-02-21 MED ORDER — LAMOTRIGINE 100 MG PO TABS: 100.0000 mg | ORAL_TABLET | Freq: Every day | ORAL | 0 refills | Status: DC

## 2020-02-21 MED ORDER — ALPRAZOLAM 1 MG PO TABS: 1.0000 mg | ORAL_TABLET | Freq: Four times a day (QID) | ORAL | 0 refills | Status: DC | PRN

## 2020-02-21 MED ORDER — MODAFINIL 200 MG PO TABS: ORAL_TABLET | ORAL | 0 refills | Status: DC

## 2020-02-21 NOTE — Unmapped (Signed)
The [facility] has made a third and final attempt to reach this patient to refill the following medication:Mycophenolate 500mg  .      We have left voicemails on the following phone numbers: [phone] and [phone].    Dates contacted: 02/10/20, 02/15/20, 02/21/20  Last scheduled delivery: 01/25/20    The patient may be at risk of non-compliance with this medication. The patient should call the [facility] at [phone] (option 4) to refill medication.    Laina Guerrieri Celedonio Savage   [facility]

## 2020-02-21 NOTE — Unmapped (Signed)
[facility] Refill Coordination Note    Specialty Medication(s) to be Shipped:   Inflammatory Disorders: mycophenolate    Other medication(s) to be shipped: n/a     Amber Bush, DOB: 05/19/1956  Phone: [phone] (home)       All above HIPAA information was verified with patient.     Was a Nurse, learning disability used for this call? No    Completed refill call assessment today to schedule patient's medication shipment from the [facility] [phone]).       Specialty medication(s) and dose(s) confirmed: Regimen is correct and unchanged.   Changes to medications: Amber Bush Reports stopping the following medications: Trintellix  Changes to insurance: No  Questions for the pharmacist: No    Confirmed patient received Welcome Packet with first shipment. The patient will receive a drug information handout for each medication shipped and additional FDA Medication Guides as required.       DISEASE/MEDICATION-SPECIFIC INFORMATION        N/A    SPECIALTY MEDICATION ADHERENCE     Medication Adherence    Patient reported X missed doses in the last month: 0  Specialty Medication: Mycophenolate 500mg   Patient is on additional specialty medications: No  Any gaps in refill history greater than 2 weeks in the last 3 months: no  Demonstrates understanding of importance of adherence: yes  Informant: patient  Reliability of informant: reliable  Confirmed plan for next specialty medication refill: delivery by pharmacy  Refills needed for supportive medications: not needed                Mycophenolate 500 mg. 7 days on hand      SHIPPING     Shipping address confirmed in Epic.     Delivery Scheduled: Yes, Expected medication delivery date: 02/24/2020.     Medication will be delivered via UPS to the prescription address in Epic WAM.    Amber Bush   [facility] Specialty Technician

## 2020-02-21 NOTE — Progress Notes (Signed)
Chronic Care Management Pharmacy  Name: Stacey Alvarez  MRN: [id number] DOB: Aug 27, 1956  Chief Complaint/ HPI  Stacey Alvarez,  64 y.o. , female presents for their Initial CCM visit with the clinical pharmacist via telephone due to COVID-19 Pandemic.  PCP : Midge Minium, MD  Chronic conditions include:  Encounter Diagnoses  Name Primary?  . Essential hypertension Yes  . Hyperlipidemia, unspecified hyperlipidemia type   . Gastroesophageal reflux disease without esophagitis   . Moderate recurrent major depression ([place])   . Depression with anxiety   . Morbid obesity [facility])    Patient Active Problem List   Diagnosis Date Noted  . History of pulmonary embolus (PE) 06/23/2019  . Dyslipidemia 06/23/2019  . Moderate recurrent major depression ([place]) 06/14/2019  . Family history of cerebral aneurysm 10/29/2018  . Syncope 10/29/2018  . Closed left ankle fracture 09/13/2018  . Vertigo 08/26/2018  . ILD (interstitial lung disease) ([place]) 10/01/2016  . Atherosclerosis of native coronary artery without angina pectoris 10/01/2016  . Chronic cough 04/05/2016  . Incidental lung nodule, > 9mm and < 24mm 04/05/2016  . History of sleep apnea 04/05/2016  . Back pain 02/07/2016  . Morbid obesity ([place]) 11/24/2015  . Hyperlipidemia 05/24/2015  . Diverticulitis 01/09/2015  . Routine general medical examination at a health care facility 11/21/2014  . Essential hypertension 10/28/2013  . OSA (obstructive sleep apnea) 10/28/2013  . Insulin resistance 10/28/2013  . GERD (gastroesophageal reflux disease) 10/28/2013  . Postmenopausal HRT (hormone replacement therapy) 10/28/2013   Past Surgical History:  Procedure Laterality Date  . CHOLECYSTECTOMY    . COLONOSCOPY    . ORIF ANKLE FRACTURE Left 09/13/2018   Procedure: OPEN REDUCTION INTERNAL FIXATION (ORIF) ANKLE FRACTURE;  Surgeon: Erle Crocker, MD;  Location: [place];  Service: Orthopedics;  Laterality: Left;  . PARTIAL HYSTERECTOMY   1999  . POLYPECTOMY     Social History   Socioeconomic History  . Marital status: Widowed    Spouse name: Not on file  . Number of children: 2  . Years of education: 86  . Highest education level: High school graduate  Occupational History  . 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 Smoking status: Former Research scientist (life sciences)  . Smokeless tobacco: Never Used  . Tobacco comment: smoked 2 years in late teens about 8-10 cigs daily.   Substance and Sexual Activity  . Alcohol use: No    Alcohol/week: 0.0 standard drinks  . Drug use: No  . Sexual activity: Not on file  Other Topics Concern  . Not on file  Social History Narrative   Lives alone, widowed. Has one dtr. in [place]. [place] and one son in [place]   Social Determinants of Health   Financial Resource Strain:   . Difficulty of Paying Living Expenses:   Food Insecurity: No Food Insecurity  . Worried About Charity fundraiser in the Last Year: Never true  . Ran Out of Food in the Last Year: Never true  Transportation Needs: No Transportation Needs  . Lack of Transportation (Medical): No  . Lack of Transportation (Non-Medical): No  Physical Activity:   . Days of Exercise per Week:   . Minutes of Exercise per Session:   Stress:   . Feeling of Stress :   Social Connections:   . Frequency of Communication with Friends and Family:   . Frequency of Social Gatherings with Friends and Family:   . Attends Religious Services:   . Active Member  of Clubs or Organizations:   . Attends Archivist Meetings:   Marland Kitchen Marital Status:    Family History  Problem Relation Age of Onset  . Diabetes Mother   . Heart disease Mother   . Anxiety disorder Mother   . Depression Mother   . Kidney disease Mother   . Diabetes Father   . Heart disease Father   . Anxiety disorder Father   . Depression Father   . Diabetes Paternal Grandmother   . Cystic fibrosis Son   . Alcohol abuse Son   . Drug abuse Son   .  Anxiety disorder Son   . Depression Son   . Breast cancer Maternal Aunt   . Breast cancer Maternal Aunt    Allergies  Allergen Reactions  . Macrobid [Nitrofurantoin Monohyd Macro] Diarrhea    Diarrhea, GI upset  . Sulfa Antibiotics Rash   Outpatient Encounter Medications as of 02/22/2020  Medication Sig  . ALPRAZolam (XANAX) 1 MG tablet Take 1 tablet (1 mg total) by mouth 4 (four) times daily as needed for anxiety.  Marland Kitchen amLODipine (NORVASC) 5 MG tablet TAKE 1/2 TABLET (2.5 MG) IN THE MORNING AND 1 WHOLE TABLET (5 MG) AT BEDTIME. (Patient taking differently: 1 WHOLE TABLET (5 MG) AT BEDTIME.)  . butalbital-aspirin-caffeine-codeine (FIORINAL WITH CODEINE) 50-325-40-30 MG capsule TAKE ONE CAPSULE BY MOUTH EVERY 8 HOURS AS NEEDED FOR MIGRAINE  . cetirizine (ZYRTEC) 10 MG tablet Take 10 mg by mouth daily.  . fenofibrate 160 MG tablet TAKE ONE TABLET BY MOUTH DAILY  . fluticasone (FLONASE) 50 MCG/ACT nasal spray Place 1 spray into both nostrils daily.  Marland Kitchen lamoTRIgine (LAMICTAL) 100 MG tablet Take 1 tablet (100 mg total) by mouth daily.  Marland Kitchen lisinopril (ZESTRIL) 20 MG tablet Take 1 tablet (20 mg total) by mouth daily.  . modafinil (PROVIGIL) 200 MG tablet Take 1/2-1 tab po qd  . mycophenolate (CELLCEPT) 500 MG tablet Take 500 mg by mouth 2 (two) times daily.   . OXYGEN Inhale 2 L into the lungs as needed (SOB).  . pantoprazole (PROTONIX) 40 MG tablet TAKE ONE TABLET BY MOUTH TWICE A DAY  . pravastatin (PRAVACHOL) 40 MG tablet TAKE ONE TABLET BY MOUTH DAILY  . predniSONE (DELTASONE) 2.5 MG tablet Take 0.25 mg by mouth daily with breakfast.   . promethazine (PHENERGAN) 25 MG tablet TAKE ONE TABLET BY MOUTH EVERY 8 HOURS AS NEEDED FOR FOR NAUSEA AND VOMITING  . temazepam (RESTORIL) 30 MG capsule Take 1 capsule (30 mg total) by mouth at bedtime.  Marland Kitchen azithromycin (ZITHROMAX) 250 MG tablet Take two tablets on day 1, then one daily x 4 days (Patient not taking: Reported on 02/22/2020)  . ciprofloxacin (CIPRO)  500 MG tablet Take 1 tablet (500 mg total) by mouth 2 (two) times daily. (Patient not taking: Reported on 02/22/2020)   No facility-administered encounter medications on file as of 02/22/2020.   Patient Care Team    Relationship Specialty Notifications Start End  Midge Minium, MD PCP - General Family Medicine  10/28/13   Skeet Latch, MD PCP - Cardiology Cardiology Admissions 06/23/19   Chucky May, MD Consulting Physician Psychiatry  11/18/14   Aloha Gell, MD Consulting Physician Obstetrics and Gynecology  11/21/14   Michael Boston, MD Consulting Physician General Surgery  09/05/15   Pyrtle, Lajuan Lines, MD Consulting Physician Gastroenterology  09/05/15   Madelin Rear, [facility] Pharmacist Pharmacist  01/06/20    Comment: PHONE NUMBER [phone]   Current Diagnosis/Assessment: Goals Addressed  This Visit's Progress   . PharmD Care Plan       CARE PLAN ENTRY  Current Barriers:  . Chronic Disease Management support, education, and care coordination needs related to Hypertension, Hyperlipidemia, GERD, Depression, and Anxiety.    Hypertension . Pharmacist Clinical Goal(s): o Over the next 90 days, patient will work with PharmD and providers to achieve BP goal <140/90.  . Current regimen:  o Lisinopril 20 mg daily . Interventions: o Continue current management . Patient self care activities - Over the next 90 days, patient will: o Check BP at least once daily 1-2 weeks, document, and provide at future appointments o Ensure daily salt intake < 2300 mg/day  Hyperlipidemia . Pharmacist Clinical Goal(s): o Over the next 90 days, patient will work with PharmD and providers to achieve LDL goal < 100. . Current regimen:  o Pravastatin 40 mg daily  o Fenofibrate 160 mg daily  . Interventions: o Continue current management . Patient self care activities - Over the next 180 days, patient will: o Continue current management  Anxiety / Insomnia  . Pharmacist Clinical  Goal(s) o Over the next 90 days, patient will work with PharmD and providers to minimize any depression related symptoms  . Current regimen: . Alprazolam 1 mg 4 (four) times daily as needed for anxiety . Temazepam 30 mg daily at bedtime for insomnia  . Interventions: o Continue current management . Patient self care activities - Over the next 90 days, patient will: o Continue current management  Depression . Pharmacist Clinical Goal(s) o Over the next 90 days, patient will work with PharmD and providers to minimize any depression related symptoms  . Current regimen:  . Lamotrigine 100 mg daily  . Modafinil 200 mg tablet - half to one whole tablet daily (MDD and OSA) . Interventions: o Continue current management . Patient self care activities - Over the next 90 days, patient will: o Continue current management  GERD (acid reflux) . Pharmacist Clinical Goal(s) o Over the next 90 days, patient will work with PharmD and providers to minimize GERD symptoms . Current regimen:  o Pantoprazole 40 mg daily  . Interventions: o Continue current management . Patient self care activities - Over the next 90 days, patient will: o Continue current management  Medication management . Pharmacist Clinical Goal(s): o Over the next 90 days, patient will work with PharmD and providers to maintain optimal medication adherence . [facility] . Interventions o Comprehensive medication review performed. o Continue current medication management strategy . Patient self care activities - Over the next 90 days, patient will: o Focus on medication adherence by continuing current medication management o Take medications as prescribed o Report any questions or concerns to PharmD and/or provider(s)  Initial goal documentation      Hypertension / obesity    BP Readings from Last 3 Encounters:  12/28/19 (!) 144/80  11/01/19 140/82  06/14/19 123/82   Patient checks BP at home  1-2x per week. Patient home BP readings are ranging: ~140/80.  We discussed diet and exercise extensively. Goal is to start back at the [facility] doing water aerobics 2-3 per week. Patient reports minimal salt consumption or overeating, feels being sedentary is main issues in weight management.   Denies dizziness, SOB, chest pain. Patient is currently controlled on the following medications. . Lisinopril 20 mg daily  Plan  Continue current medications and control with diet and exercise. See depression a/p for BP monitoring plan.  Hyperlipidemia   Lipid Panel     Component Value Date/Time   CHOL 216 (H) 06/14/2019 1509   TRIG 169.0 (H) 06/14/2019 1509   HDL 55.60 06/14/2019 1509   LDLCALC 127 (H) 06/14/2019 1509   LDLDIRECT 144.0 06/12/2016 1112    The 10-year ASCVD risk score Mikey Bussing DC Jr., et al., 2013) is: 10.6%   Values used to calculate the score:     Age: 59 years     Sex: Female     Is Non-Hispanic African American: No     Diabetic: No     Tobacco smoker: No     Systolic Blood Pressure: 381 mmHg     Is BP treated: Yes     HDL Cholesterol: 55.6 mg/dL     Total Cholesterol: 216 mg/dL   Mild LDL elevation. Exercise/diet recommendations reviewed. Patient is currently near goal on the following medications:  . Fenofibrate 160 mg daily . Pravastatin 40 mg daily   Plan  Continue current medications and control with diet and exercise.  GERD   Patient denies dysphagia, heartburn or nausea and reports not being bothered by any sort of food trigger. Has expressed interest in tapering off of pantoprazole. Reports that acid reflux was only bothersome with previous trintellix and NSAID use.   Currently controlled on: . Pantoprazole 40 mg twice daily  Plan   Decrease pantoprazole to 40 mg daily. RPh call patient to review symptoms in 2 weeks.   Depression   Followed by Thayer Headings PMHNP. Modafinil recently added to regimen, will be starting at 100 mg daily. Lamotrigine  decreased to 100 mg daily from 150 mg, with goal of 75 mg daily. Discussed home monitoring of BP. Patient is currently controlled on the following medications:  . Lamotrigine 100 mg daily  . Modafinil 200 mg tablet - half to one whole tablet daily (MDD and OSA)  Plan  Continue current medications. Monitor BP daily at home next 1-2 weeks.   Anxiety / Insomnia   Followed by Thayer Headings PMHNP. Taking alprazolam and temazepam. Reports continued benefit with anxiety/insomnia regimen. Typically takes 2-3 tablets/day of alprazolam if needed. Patient is currently controlled on the following medications:  . Alprazolam 1 mg 4 (four) times daily as needed for anxiety . Temazepam 30 mg daily at bedtime for insomnia   Plan  Continue current medications.   Vaccines   Reviewed and discussed patient's vaccination history.    Immunization History  Administered Date(s) Administered  . Fluad Quad(high Dose 65+) 06/14/2019  . Influenza,inj,Quad PF,6+ Mos 06/16/2013, 06/12/2016, 07/11/2017, 05/20/2018  . Influenza-Unspecified 09/16/2014, 11/19/2015  . PFIZER SARS-COV-2 Vaccination 10/08/2019  . Pneumococcal Conjugate-13 06/14/2019  . Tdap 10/28/2005, 11/24/2015   Plan  Recommended patient receive Shingrix vaccine in pharmacy.   Medication Management   Receives prescription medications from:  [facility] at [place], [place] [address] Phone: [phone] Fax: [phone]  Denies any issues with current medications.  Plan  Continue current medication management strategy.  Follow up: 3 month phone visit. ______________ Visit Information SDOH (Social Determinants of Health) assessments performed: Yes.  Ms. Flavell was given information about Chronic Care Management services today including:  1. CCM service includes personalized support from designated clinical staff supervised by her physician,  including individualized plan of care and coordination with other care providers 2. 24/7 contact phone numbers for assistance for urgent and routine care needs. 3. Standard insurance, coinsurance,  copays and deductibles apply for chronic care management only during months in which we provide at least 20 minutes of these services. Most insurances cover these services at 100%, however patients may be responsible for any copay, coinsurance and/or deductible if applicable. This service may help you avoid the need for more expensive face-to-face services. 4. Only one practitioner may furnish and bill the service in a calendar month. 5. The patient may stop CCM services at any time (effective at the end of the month) by phone call to the office staff.  Patient agreed to services and verbal consent obtained.   Madelin Rear, Pharm.D., BCGP Clinical Pharmacist [facility] [phone]

## 2020-02-21 NOTE — Progress Notes (Signed)
Stacey Alvarez [id number] Oct 16, 1955 64 y.o.  Subjective:   Patient ID:  Stacey Alvarez is a 64 y.o. (DOB September 16, 1956) female.  Chief Complaint:  Chief Complaint  Patient presents with   Depression   Anxiety   Sleeping Problem    HPI Stacey Alvarez presents to the office today for follow-up of depression and anxiety. She reports, "I don't think the [place] is working and I am not tolerating the [place]." Currently taking Trintellix 10 mg po qd and continues to have GI side effects at lower dose (nausea daily and constipation alternating with diarrhea).   She reports that her mood has been depressed. She reports rumination and frequent crying. She reports irritability- "I'm angry. I'm angry with God, I'm angry with myself." Diminished interest in things. Denies anhedonia and reports that she enjoys being able to see her daughter. She reports that she has been withdrawn and self-isolating. She reports that anxiety has been high. She reports that she continues to grieve the losses of her son and husband. She reports very low energy and motivation. Low appetite and reports that she has not been eating well. Waking up frequently at night. Reports that she has vivid dreams that are mostly good. She reports poor concentration and has to read things more than once to understand content. Will start tasks and not complete them.  "I feel like my life is over. I don't anticipate anything being any different." She reports vague SI. Denies intent and reports that she would not want to commit suicide because of her daughter.   She reports that she has 4 TMS treatments remaining.   Has sleep apnea and uses cPap regularly.   Has started planning Celebration of Life service for her son.   Past Psychiatric Medication Trials: (Reports sensitivity to medication) Lamictal- reports that 150 mg felt that this was too high. Paxil- Took years ago in combination with other medications Lexapro-ineffective Zoloft-  ineffective Prozac- Has tremor and unsteadiness on 80 mg. Has taken about 5 years. Unsure of benefit. Effexor XR- adverse reaction. Confusion. Remeron- Wt gain. Wellbutrin- rash during clinical trials.  Gabapentin- felt unsteady Temazepam Xanax Lithium-Ineffective Abilify-Ineffective.  Risperdal- wt gain Viibryd- Reports that Dr. Toy Care has told her that she was on it and she does not recall this.   Not sure if she has taken Cymbalta.   GAD-7     Office Visit from 02/21/2020 in [facility]  Total GAD-7 Score  12    PHQ2-9     Office Visit from 02/21/2020 in [place] Office Visit from 07/30/2019 in [place] Office Visit from 06/14/2019 in [place] Office Visit from 04/12/2019 in [place] Office Visit from 08/26/2018 in [place]  PHQ-2 Total Score  6  0  3  2  0  PHQ-9 Total Score  20  0  3  3  0       Review of Systems:  Review of Systems  Gastrointestinal: Positive for constipation, diarrhea and nausea.  Musculoskeletal: Positive for arthralgias. Negative for gait problem.       Hip and knee pain. Residual pain from ankle fx  Neurological: Positive for headaches. Negative for tremors.  Psychiatric/Behavioral:       Please refer to HPI    Medications: I have reviewed the patient's current medications.  Current Outpatient Medications  Medication Sig Dispense Refill   ALPRAZolam (XANAX) 1 MG tablet Take 1 tablet (  1 mg total) by mouth 4 (four) times daily as needed for anxiety. 360 tablet 0   amLODipine (NORVASC) 5 MG tablet TAKE 1/2 TABLET (2.5 MG) IN THE MORNING AND 1 WHOLE TABLET (5 MG) AT BEDTIME. (Patient taking differently: 1 WHOLE TABLET (5 MG) AT BEDTIME.) 135 tablet 3   butalbital-aspirin-caffeine-codeine (FIORINAL WITH CODEINE) 50-325-40-30 MG capsule TAKE ONE  CAPSULE BY MOUTH EVERY 8 HOURS AS NEEDED FOR MIGRAINE 21 capsule 0   cetirizine (ZYRTEC) 10 MG tablet Take 10 mg by mouth daily.     fenofibrate 160 MG tablet TAKE ONE TABLET BY MOUTH DAILY 90 tablet 0   fluticasone (FLONASE) 50 MCG/ACT nasal spray Place 1 spray into both nostrils daily.     lamoTRIgine (LAMICTAL) 100 MG tablet Take 1 tablet (100 mg total) by mouth daily. 90 tablet 0   lisinopril (ZESTRIL) 20 MG tablet Take 1 tablet (20 mg total) by mouth daily. 180 tablet 0   mycophenolate (CELLCEPT) 500 MG tablet Take 500 mg by mouth 2 (two) times daily.      pantoprazole (PROTONIX) 40 MG tablet TAKE ONE TABLET BY MOUTH TWICE A DAY 180 tablet 1   pravastatin (PRAVACHOL) 40 MG tablet TAKE ONE TABLET BY MOUTH DAILY 90 tablet 0   predniSONE (DELTASONE) 2.5 MG tablet Take 0.25 mg by mouth daily with breakfast.   11   promethazine (PHENERGAN) 25 MG tablet TAKE ONE TABLET BY MOUTH EVERY 8 HOURS AS NEEDED FOR FOR NAUSEA AND VOMITING 30 tablet 0   temazepam (RESTORIL) 30 MG capsule Take 1 capsule (30 mg total) by mouth at bedtime. 90 capsule 0   azithromycin (ZITHROMAX) 250 MG tablet Take two tablets on day 1, then one daily x 4 days (Patient not taking: Reported on 11/23/2019) 6 tablet 0   ciprofloxacin (CIPRO) 500 MG tablet Take 1 tablet (500 mg total) by mouth 2 (two) times daily. (Patient not taking: Reported on 02/21/2020) 14 tablet 0   modafinil (PROVIGIL) 200 MG tablet Take 1/2-1 tab po qd 30 tablet 0   OXYGEN Inhale 2 L into the lungs as needed (SOB).     No current facility-administered medications for this visit.    Medication Side Effects: Nausea and Other: Diarrhea  Allergies:  Allergies  Allergen Reactions   Macrobid [Nitrofurantoin Monohyd Macro] Diarrhea    Diarrhea, GI upset   Sulfa Antibiotics Rash    Past Medical History:  Diagnosis Date   Allergy    Anxiety    Arthritis    KNEES,BACK,HIPS   Depression    Fibromyalgia    GERD (gastroesophageal  reflux disease)    Hyperlipidemia    Hypertension    IBS (irritable bowel syndrome)    Interstitial lung disease (HCC)    Pulmonary embolism ([place]) 09/2016   provoked- s/p rotator cuff repair   Sleep apnea     Family History  Problem Relation Age of Onset   Diabetes Mother    Heart disease Mother    Anxiety disorder Mother    Depression Mother    Kidney disease Mother    Diabetes Father    Heart disease Father    Anxiety disorder Father    Depression Father    Diabetes Paternal Grandmother    Cystic fibrosis Son    Alcohol abuse Son    Drug abuse Son    Anxiety disorder Son    Depression Son    Breast cancer Maternal Aunt    Breast cancer Maternal Aunt  Social History   Socioeconomic History   Marital status: Widowed    Spouse name: Not on file   Number of children: 2   Years of education: 12   Highest education level: High school graduate  Occupational History   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   Substance and Sexual Activity   Alcohol use: No    Alcohol/week: 0.0 standard drinks   Drug use: No   Sexual activity: Not on file  Other Topics Concern   Not on file  Social History Narrative   Lives alone, widowed. Has one dtr. in [place]. [place] and one son in [place]./fim   Social Determinants of Health   Financial Resource Strain:    Difficulty of Paying Living Expenses:   Food Insecurity:    Worried About Charity fundraiser in the Last Year:    Arboriculturist in the Last Year:   Transportation Needs:    Film/video editor (Medical):    Lack of Transportation (Non-Medical):   Physical Activity:    Days of Exercise per Week:    Minutes of Exercise per Session:   Stress:    Feeling of Stress :   Social Connections:    Frequency of  Communication with Friends and Family:    Frequency of Social Gatherings with Friends and Family:    Attends Religious Services:    Active Member of Clubs or Organizations:    Attends Music therapist:    Marital Status:   Intimate Partner Violence:    Fear of Current or Ex-Partner:    Emotionally Abused:    Physically Abused:    Sexually Abused:     Past Medical History, Surgical history, Social history, and Family history were reviewed and updated as appropriate.   Please see review of systems for further details on the patient's review from today.   Objective:   Physical Exam:  BP (!) 143/82    Pulse 79   Physical Exam Constitutional:      General: She is not in acute distress. Musculoskeletal:        General: No deformity.  Neurological:     Mental Status: She is alert and oriented to person, place, and time.     Coordination: Coordination normal.  Psychiatric:        Attention and Perception: Perception normal. She does not perceive auditory or visual hallucinations.        Mood and Affect: Mood is anxious and depressed. Affect is blunt. Affect is not labile, angry or inappropriate.        Speech: Speech normal.        Behavior: Behavior is withdrawn. Behavior is cooperative.        Thought Content: Thought content normal. Thought content is not paranoid or delusional. Thought content does not include homicidal or suicidal ideation. Thought content does not include homicidal or suicidal plan.        Cognition and Memory: Cognition and memory normal.        Judgment: Judgment normal.     Comments: Insight intact Difficulty with attention     Lab Review:     Component Value Date/Time   NA 138 06/14/2019 1509   K 4.7 06/14/2019 1509   CL 100 06/14/2019 1509   CO2 28 06/14/2019 1509  GLUCOSE 98 06/14/2019 1509   BUN 16 06/14/2019 1509   CREATININE 0.75 06/14/2019 1509   CALCIUM 10.1 06/14/2019 1509   PROT 7.1 06/14/2019 1509   ALBUMIN 4.6  06/14/2019 1509   AST 10 06/14/2019 1509   ALT 9 06/14/2019 1509   ALKPHOS 65 06/14/2019 1509   BILITOT 0.5 06/14/2019 1509   GFRNONAA >60 09/13/2018 0255   GFRAA >60 09/13/2018 0255       Component Value Date/Time   WBC 8.5 06/14/2019 1509   RBC 4.67 06/14/2019 1509   HGB 13.1 06/14/2019 1509   HCT 39.0 06/14/2019 1509   PLT 329.0 06/14/2019 1509   MCV 83.6 06/14/2019 1509   MCH 27.4 09/15/2018 0808   MCHC 33.4 06/14/2019 1509   RDW 14.3 06/14/2019 1509   LYMPHSABS 1.6 06/14/2019 1509   MONOABS 0.4 06/14/2019 1509   EOSABS 0.2 06/14/2019 1509   BASOSABS 0.1 06/14/2019 1509    No results found for: POCLITH, LITHIUM   No results found for: PHENYTOIN, PHENOBARB, VALPROATE, CBMZ   .res Assessment: Plan:   Stop Trintellix.  Discussed several possible treatment options and potential benefits, risks, and side effects of Rexulti and modafinil.  Patient reports that she would prefer to avoid any medications with potential risk of weight gain.  She agrees to trial of modafinil. Start Modafanil 200 mg 1/2 tab po qd for off label tx of depression and to improve energy and motivation.  Discussed that she could increase to 1 tablet daily if she notices only some partial improvement with 100 mg dose and is not experiencing side effects.  Discussed that she could also try dividing doses if 100 mg is effective for only a few hours, i.e. taking 100 mg in the morning and 100 mg midday/early afternoon. Case also staffed with Dr. Clovis Pu. May consider Pramipexole in the future.  May also consider ECT referral. Patient to follow-up in 2 weeks or sooner if clinically indicated. Recommend continuing psychotherapy with Beckey Downing, [facility].  Patient advised to contact office with any questions, adverse effects, or acute worsening in signs and symptoms.  Stacey Alvarez was seen today for depression, anxiety and sleeping problem.  Diagnoses and all orders for this visit:  Sleep apnea, unspecified type -      modafinil (PROVIGIL) 200 MG tablet; Take 1/2-1 tab po qd  Severe episode of recurrent major depressive disorder, without psychotic features ([place]) -     modafinil (PROVIGIL) 200 MG tablet; Take 1/2-1 tab po qd -     lamoTRIgine (LAMICTAL) 100 MG tablet; Take 1 tablet (100 mg total) by mouth daily.  Generalized anxiety disorder -     ALPRAZolam (XANAX) 1 MG tablet; Take 1 tablet (1 mg total) by mouth 4 (four) times daily as needed for anxiety.     Please see After Visit Summary for patient specific instructions.  Future Appointments  Date Time Provider [place]  02/22/2020 10:30 AM [facility] PHARMACIST [facility]  03/09/2020  9:00 AM Thayer Headings, PMHNP [facility] None    No orders of the defined types were placed in this encounter.   -------------------------------

## 2020-02-22 ENCOUNTER — Ambulatory Visit: Payer: Medicare HMO

## 2020-02-22 ENCOUNTER — Telehealth: Payer: Self-pay

## 2020-02-22 DIAGNOSIS — I10 Essential (primary) hypertension: Secondary | ICD-10-CM

## 2020-02-22 DIAGNOSIS — K219 Gastro-esophageal reflux disease without esophagitis: Secondary | ICD-10-CM

## 2020-02-22 DIAGNOSIS — F331 Major depressive disorder, recurrent, moderate: Secondary | ICD-10-CM

## 2020-02-22 DIAGNOSIS — E785 Hyperlipidemia, unspecified: Secondary | ICD-10-CM

## 2020-02-22 DIAGNOSIS — F418 Other specified anxiety disorders: Secondary | ICD-10-CM

## 2020-02-22 NOTE — Telephone Encounter (Signed)
-----   Message from Midge Minium, MD sent at 02/22/2020 11:46 AM EDT ----- Ok to decrease dose  Thanks! KT ----- Message ----- From: Madelin Rear, [facility] Sent: 02/22/2020  11:34 AM EDT To: Midge Minium, MD  Dr Birdie Riddle  Stacey Alvarez says her acid reflux is no longer an issue since stopping long term NSAID use and trintellix. No hypersecretory conditions noted, is taking low dose prednisone 2.5 mg daily. No current NSAID use.   She is on pantoprazole 40 mg twice daily - would you be onboard with [place] lowering to pantoprazole 40 mg daily today? I will inform patient and also f/u in 2 weeks to see if complete d/c might be ok if so.   Thanks Edison Nasuti

## 2020-02-22 NOTE — Patient Instructions (Signed)
Please call me at [phone] (direct line) with any questions - thank you!  - Edyth Gunnels., Clinical Pharmacist  Goals Addressed            This Visit's Progress   . PharmD Care Plan       CARE PLAN ENTRY  Current Barriers:  . Chronic Disease Management support, education, and care coordination needs related to Hypertension, Hyperlipidemia, GERD, Depression, and Anxiety.    Hypertension . Pharmacist Clinical Goal(s): o Over the next 90 days, patient will work with PharmD and providers to achieve BP goal <140/90.  . Current regimen:  o Lisinopril 20 mg daily . Interventions: o Continue current management . Patient self care activities - Over the next 90 days, patient will: o Check BP at least once every 1-2 weeks, document, and provide at future appointments o Ensure daily salt intake < 2300 mg/day  Hyperlipidemia . Pharmacist Clinical Goal(s): o Over the next 90 days, patient will work with PharmD and providers to achieve LDL goal < 100. . Current regimen:  o Pravastatin 40 mg daily  o Fenofibrate 160 mg daily  . Interventions: o Continue current management . Patient self care activities - Over the next 180 days, patient will: o Continue current management  Anxiety / Insomnia  . Pharmacist Clinical Goal(s) o Over the next 90 days, patient will work with PharmD and providers to minimize any depression related symptoms  . Current regimen: . Alprazolam 1 mg 4 (four) times daily as needed for anxiety . Temazepam 30 mg daily at bedtime for insomnia  . Interventions: o Continue current management . Patient self care activities - Over the next 90 days, patient will: o Continue current management  Depression . Pharmacist Clinical Goal(s) o Over the next 90 days, patient will work with PharmD and providers to minimize any depression related symptoms  . Current regimen:  . Lamotrigine 100 mg daily  . Modafinil 200 mg tablet - half to one whole tablet daily (MDD and  OSA) . Interventions: o Continue current management . Patient self care activities - Over the next 90 days, patient will: o Continue current management  GERD (acid reflux) . Pharmacist Clinical Goal(s) o Over the next 90 days, patient will work with PharmD and providers to minimize GERD symptoms . Current regimen:  o Pantoprazole 40 mg daily  . Interventions: o Continue current management . Patient self care activities - Over the next 90 days, patient will: o Continue current management  Medication management . Pharmacist Clinical Goal(s): o Over the next 90 days, patient will work with PharmD and providers to maintain optimal medication adherence . [facility] . Interventions o Comprehensive medication review performed. o Continue current medication management strategy . Patient self care activities - Over the next 90 days, patient will: o Focus on medication adherence by continuing current medication management o Take medications as prescribed o Report any questions or concerns to PharmD and/or provider(s)  Initial goal documentation      Ms. Knab was given information about Chronic Care Management services today including:  1. CCM service includes personalized support from designated clinical staff supervised by her physician, including individualized plan of care and coordination with other care providers 2. 24/7 contact phone numbers for assistance for urgent and routine care needs. 3. Standard insurance, coinsurance, copays and deductibles apply for chronic care management only during months in which we provide at least 20 minutes of these services. Most insurances cover these services at 100%, however  patients may be responsible for any copay, coinsurance and/or deductible if applicable. This service may help you avoid the need for more expensive face-to-face services. 4. Only one practitioner may furnish and bill the service in a calendar  month. 5. The patient may stop CCM services at any time (effective at the end of the month) by phone call to the office staff.  Patient agreed to services and verbal consent obtained.   The patient verbalized understanding of instructions provided today and agreed to receive a mailed copy of patient instruction and/or educational materials. Telephone follow up appointment with pharmacy team member scheduled for: See next appointment with "Care Management Staff" under "What's Next" below.   Thank you!  Madelin Rear, Pharm.D., BCGP Clinical Pharmacist [facility] [phone]  Hypertension, Adult High blood pressure (hypertension) is when the force of blood pumping through the arteries is too strong. The arteries are the blood vessels that carry blood from the heart throughout the body. Hypertension forces the heart to work harder to pump blood and may cause arteries to become narrow or stiff. Untreated or uncontrolled hypertension can cause a heart attack, heart failure, a stroke, kidney disease, and other problems. A blood pressure reading consists of a higher number over a lower number. Ideally, your blood pressure should be below 120/80. The first ("top") number is called the systolic pressure. It is a measure of the pressure in your arteries as your heart beats. The second ("bottom") number is called the diastolic pressure. It is a measure of the pressure in your arteries as the heart relaxes. What are the causes? The exact cause of this condition is not known. There are some conditions that result in or are related to high blood pressure. What increases the risk? Some risk factors for high blood pressure are under your control. The following factors may make you more likely to develop this condition:  Smoking.  Having type 2 diabetes mellitus, high cholesterol, or both.  Not getting enough exercise or physical activity.  Being overweight.  Having  too much fat, sugar, calories, or salt (sodium) in your diet.  Drinking too much alcohol. Some risk factors for high blood pressure may be difficult or impossible to change. Some of these factors include:  Having chronic kidney disease.  Having a family history of high blood pressure.  Age. Risk increases with age.  Race. You may be at higher risk if you are African American.  Gender. Men are at higher risk than women before age 17. After age 64, women are at higher risk than men.  Having obstructive sleep apnea.  Stress. What are the signs or symptoms? High blood pressure may not cause symptoms. Very high blood pressure (hypertensive crisis) may cause:  Headache.  Anxiety.  Shortness of breath.  Nosebleed.  Nausea and vomiting.  Vision changes.  Severe chest pain.  Seizures. How is this diagnosed? This condition is diagnosed by measuring your blood pressure while you are seated, with your arm resting on a flat surface, your legs uncrossed, and your feet flat on the floor. The cuff of the blood pressure monitor will be placed directly against the skin of your upper arm at the level of your heart. It should be measured at least twice using the same arm. Certain conditions can cause a difference in blood pressure between your right and left arms. Certain factors can cause blood pressure readings to be lower or higher than normal for a short period of  time:  When your blood pressure is higher when you are in a health care provider's office than when you are at home, this is called white coat hypertension. Most people with this condition do not need medicines.  When your blood pressure is higher at home than when you are in a health care provider's office, this is called masked hypertension. Most people with this condition may need medicines to control blood pressure. If you have a high blood pressure reading during one visit or you have normal blood pressure with other risk  factors, you may be asked to:  Return on a different day to have your blood pressure checked again.  Monitor your blood pressure at home for 1 week or longer. If you are diagnosed with hypertension, you may have other blood or imaging tests to help your health care provider understand your overall risk for other conditions. How is this treated? This condition is treated by making healthy lifestyle changes, such as eating healthy foods, exercising more, and reducing your alcohol intake. Your health care provider may prescribe medicine if lifestyle changes are not enough to get your blood pressure under control, and if:  Your systolic blood pressure is above 130.  Your diastolic blood pressure is above 80. Your personal target blood pressure may vary depending on your medical conditions, your age, and other factors. Follow these instructions at home: Eating and drinking   Eat a diet that is high in fiber and potassium, and low in sodium, added sugar, and fat. An example eating plan is called the DASH (Dietary Approaches to Stop Hypertension) diet. To eat this way: ? Eat plenty of fresh fruits and vegetables. Try to fill one half of your plate at each meal with fruits and vegetables. ? Eat whole grains, such as whole-wheat pasta, brown rice, or whole-grain bread. Fill about one fourth of your plate with whole grains. ? Eat or drink low-fat dairy products, such as skim milk or low-fat yogurt. ? Avoid fatty cuts of meat, processed or cured meats, and poultry with skin. Fill about one fourth of your plate with lean proteins, such as fish, chicken without skin, beans, eggs, or tofu. ? Avoid pre-made and processed foods. These tend to be higher in sodium, added sugar, and fat.  Reduce your daily sodium intake. Most people with hypertension should eat less than 1,500 mg of sodium a day.  Do not drink alcohol if: ? Your health care provider tells you not to drink. ? You are pregnant, may be  pregnant, or are planning to become pregnant.  If you drink alcohol: ? Limit how much you use to:  0-1 drink a day for women.  0-2 drinks a day for men. ? Be aware of how much alcohol is in your drink. In the [place] one drink equals one 12 oz bottle of beer (355 mL), one 5 oz glass of wine (148 mL), or one 1 oz glass of hard liquor (44 mL). Lifestyle   Work with your health care provider to maintain a healthy body weight or to lose weight. Ask what an ideal weight is for you.  Get at least 30 minutes of exercise most days of the week. Activities may include walking, swimming, or biking.  Include exercise to strengthen your muscles (resistance exercise), such as Pilates or lifting weights, as part of your weekly exercise routine. Try to do these types of exercises for 30 minutes at least 3 days a week.  Do not use any products  that contain nicotine or tobacco, such as cigarettes, e-cigarettes, and chewing tobacco. If you need help quitting, ask your health care provider.  Monitor your blood pressure at home as told by your health care provider.  Keep all follow-up visits as told by your health care provider. This is important. Medicines  Take over-the-counter and prescription medicines only as told by your health care provider. Follow directions carefully. Blood pressure medicines must be taken as prescribed.  Do not skip doses of blood pressure medicine. Doing this puts you at risk for problems and can make the medicine less effective.  Ask your health care provider about side effects or reactions to medicines that you should watch for. Contact a health care provider if you:  Think you are having a reaction to a medicine you are taking.  Have headaches that keep coming back (recurring).  Feel dizzy.  Have swelling in your ankles.  Have trouble with your vision. Get help right away if you:  Develop a severe headache or confusion.  Have unusual weakness or numbness.  Feel  faint.  Have severe pain in your chest or abdomen.  Vomit repeatedly.  Have trouble breathing. Summary  Hypertension is when the force of blood pumping through your arteries is too strong. If this condition is not controlled, it may put you at risk for serious complications.  Your personal target blood pressure may vary depending on your medical conditions, your age, and other factors. For most people, a normal blood pressure is less than 120/80.  Hypertension is treated with lifestyle changes, medicines, or a combination of both. Lifestyle changes include losing weight, eating a healthy, low-sodium diet, exercising more, and limiting alcohol. This information is not intended to replace advice given to you by your health care provider. Make sure you discuss any questions you have with your health care provider. Document Revised: 05/13/2018 Document Reviewed: 05/13/2018 Elsevier Patient Education  2020 [employer].

## 2020-02-22 NOTE — Telephone Encounter (Signed)
-----   Message from Midge Minium, MD sent at 02/22/2020 11:46 AM EDT ----- Ok to decrease dose  Thanks! KT ----- Message ----- From: Madelin Rear, [facility] Sent: 02/22/2020  11:34 AM EDT To: Midge Minium, MD  Dr Birdie Riddle  Stacey Alvarez says her acid reflux is no longer an issue since stopping long term NSAID use and trintellix. No hypersecretory conditions noted, is taking low dose prednisone 2.5 mg daily. No current NSAID use.   She is on pantoprazole 40 mg twice daily - would you be onboard with [place] lowering to pantoprazole 40 mg daily today? I will inform patient and also f/u in 2 weeks to see if complete d/c might be ok if so.   Thanks Edison Nasuti

## 2020-02-23 ENCOUNTER — Other Ambulatory Visit: Payer: Self-pay

## 2020-02-23 ENCOUNTER — Telehealth (INDEPENDENT_AMBULATORY_CARE_PROVIDER_SITE_OTHER): Payer: Medicare HMO | Admitting: Family Medicine

## 2020-02-23 ENCOUNTER — Encounter: Payer: Self-pay | Admitting: Family Medicine

## 2020-02-23 VITALS — BP 142/80 | HR 80

## 2020-02-23 DIAGNOSIS — M797 Fibromyalgia: Secondary | ICD-10-CM | POA: Diagnosis not present

## 2020-02-23 MED ORDER — PREGABALIN 75 MG PO CAPS: 75.0000 mg | ORAL_CAPSULE | Freq: Two times a day (BID) | ORAL | 1 refills | Status: AC

## 2020-02-23 MED FILL — MYCOPHENOLATE MOFETIL 500 MG TABLET: ORAL | 30 days supply | Qty: 60 | Fill #11

## 2020-02-23 MED FILL — MYCOPHENOLATE MOFETIL 500 MG TABLET: 30 days supply | Qty: 60 | Fill #11 | Status: AC

## 2020-02-23 NOTE — Progress Notes (Signed)
I have discussed the procedure for the virtual visit with the patient who has given consent to proceed with assessment and treatment.   Nykeem Citro L Fern Asmar, CMA     

## 2020-02-23 NOTE — Progress Notes (Signed)
Virtual Visit via Video   I connected with patient on 02/23/20 at  4:00 PM EDT by a video enabled telemedicine application and verified that I am speaking with the correct person using two identifiers.  Location patient: Home Location provider: Acupuncturist, Office Persons participating in the virtual visit: Patient, Provider, [place] (Jess B)  I discussed the limitations of evaluation and management by telemedicine and the availability of in person appointments. The patient expressed understanding and agreed to proceed.  Subjective:   HPI:  Depression- is doing [place] and has 3 sessions left.  She reports that she's not sure it's working.  Trintellix was ineffective.  Starts Modafinil tomorrow.  They are discussing ECT but she is fearful of 'being put to sleep that often'.  Diffuse pain- L knee pain, R hip bursitis, sciatica.  Reports she has pain all over.  Skin is TTP.   Over the last few weeks pain has been 'constant' and 'getting worse'.  No improvement w/ Tylenol and she is not able to tolerate NSAIDs.  Pt reports pain in 'lower calves on the outside' bilaterally.  This is keeping her awake at night.  No relief w/ heat or ice.     ROS:   See pertinent positives and negatives per HPI.  Patient Active Problem List   Diagnosis Date Noted  . History of pulmonary embolus (PE) 06/23/2019  . Dyslipidemia 06/23/2019  . Moderate recurrent major depression ([place]) 06/14/2019  . Family history of cerebral aneurysm 10/29/2018  . Syncope 10/29/2018  . Closed left ankle fracture 09/13/2018  . Vertigo 08/26/2018  . ILD (interstitial lung disease) ([place]) 10/01/2016  . Atherosclerosis of native coronary artery without angina pectoris 10/01/2016  . Chronic cough 04/05/2016  . Incidental lung nodule, > 38mm and < 33mm 04/05/2016  . History of sleep apnea 04/05/2016  . Back pain 02/07/2016  . Morbid obesity ([place]) 11/24/2015  . Hyperlipidemia 05/24/2015  . Diverticulitis 01/09/2015  . Routine  general medical examination at a health care facility 11/21/2014  . Essential hypertension 10/28/2013  . OSA (obstructive sleep apnea) 10/28/2013  . Insulin resistance 10/28/2013  . GERD (gastroesophageal reflux disease) 10/28/2013  . Postmenopausal HRT (hormone replacement therapy) 10/28/2013    Social History   Tobacco Use  . Smoking status: Former Research scientist (life sciences)  . Smokeless tobacco: Never Used  . Tobacco comment: smoked 2 years in late teens about 8-10 cigs daily.   Substance Use Topics  . Alcohol use: No    Alcohol/week: 0.0 standard drinks    Current Outpatient Medications:  .  ALPRAZolam (XANAX) 1 MG tablet, Take 1 tablet (1 mg total) by mouth 4 (four) times daily as needed for anxiety., Disp: 360 tablet, Rfl: 0 .  amLODipine (NORVASC) 5 MG tablet, TAKE 1/2 TABLET (2.5 MG) IN THE MORNING AND 1 WHOLE TABLET (5 MG) AT BEDTIME. (Patient taking differently: 1 WHOLE TABLET (5 MG) AT BEDTIME.), Disp: 135 tablet, Rfl: 3 .  butalbital-aspirin-caffeine-codeine (FIORINAL WITH CODEINE) 50-325-40-30 MG capsule, TAKE ONE CAPSULE BY MOUTH EVERY 8 HOURS AS NEEDED FOR MIGRAINE, Disp: 21 capsule, Rfl: 0 .  cetirizine (ZYRTEC) 10 MG tablet, Take 10 mg by mouth daily., Disp: , Rfl:  .  fenofibrate 160 MG tablet, TAKE ONE TABLET BY MOUTH DAILY, Disp: 90 tablet, Rfl: 0 .  fluticasone (FLONASE) 50 MCG/ACT nasal spray, Place 1 spray into both nostrils daily., Disp: , Rfl:  .  lamoTRIgine (LAMICTAL) 100 MG tablet, Take 1 tablet (100 mg total) by mouth daily., Disp: 90  tablet, Rfl: 0 .  lisinopril (ZESTRIL) 20 MG tablet, Take 1 tablet (20 mg total) by mouth daily., Disp: 180 tablet, Rfl: 0 .  modafinil (PROVIGIL) 200 MG tablet, Take 1/2-1 tab po qd, Disp: 30 tablet, Rfl: 0 .  mycophenolate (CELLCEPT) 500 MG tablet, Take 500 mg by mouth 2 (two) times daily. , Disp: , Rfl:  .  OXYGEN, Inhale 2 L into the lungs as needed (SOB)., Disp: , Rfl:  .  pantoprazole (PROTONIX) 40 MG tablet, TAKE ONE TABLET BY MOUTH TWICE A  DAY (Patient taking differently: Take 40 mg by mouth daily. ), Disp: 180 tablet, Rfl: 1 .  pravastatin (PRAVACHOL) 40 MG tablet, TAKE ONE TABLET BY MOUTH DAILY, Disp: 90 tablet, Rfl: 0 .  predniSONE (DELTASONE) 2.5 MG tablet, Take 0.25 mg by mouth daily with breakfast. , Disp: , Rfl: 11 .  promethazine (PHENERGAN) 25 MG tablet, TAKE ONE TABLET BY MOUTH EVERY 8 HOURS AS NEEDED FOR FOR NAUSEA AND VOMITING, Disp: 30 tablet, Rfl: 0 .  temazepam (RESTORIL) 30 MG capsule, Take 1 capsule (30 mg total) by mouth at bedtime., Disp: 90 capsule, Rfl: 0 .  traMADol (ULTRAM) 50 MG tablet, Take 50 mg by mouth every 8 (eight) hours as needed., Disp: , Rfl:   Allergies  Allergen Reactions  . Macrobid [Nitrofurantoin Monohyd Macro] Diarrhea    Diarrhea, GI upset  . Sulfa Antibiotics Rash    Objective:   BP (!) 142/80   Pulse 80   SpO2 98%  AAOx3, NAD NCAT, EOMI No obvious CN deficits Coloring WNL Pt is able to speak clearly, coherently without shortness of breath or increased work of breathing.  Thought process is linear.  Mood is appropriate.   Assessment and Plan:   Fibromyalgia- new.  Pt's constellation of sxs fit with the diagnosis- depressed mood, poor sleep, wide spread pain of both joints and soft tissues.  No relief w/ tylenol, unable to tolerate NSAIDs.  Want to avoid prednisone if possible and no narcotics given mood.  Will start Lyrica 75mg  BID and titrate as needed.  Pt expressed understanding and is in agreement w/ plan.    Annye Asa, MD 02/23/2020

## 2020-02-25 ENCOUNTER — Encounter: Payer: Self-pay | Admitting: Family Medicine

## 2020-02-25 ENCOUNTER — Ambulatory Visit (INDEPENDENT_AMBULATORY_CARE_PROVIDER_SITE_OTHER): Payer: Medicare HMO

## 2020-02-25 ENCOUNTER — Other Ambulatory Visit: Payer: Self-pay

## 2020-02-25 ENCOUNTER — Telehealth: Payer: Self-pay | Admitting: Family Medicine

## 2020-02-25 DIAGNOSIS — Z86711 Personal history of pulmonary embolism: Secondary | ICD-10-CM | POA: Diagnosis not present

## 2020-02-25 DIAGNOSIS — R52 Pain, unspecified: Secondary | ICD-10-CM

## 2020-02-25 DIAGNOSIS — E559 Vitamin D deficiency, unspecified: Secondary | ICD-10-CM

## 2020-02-25 DIAGNOSIS — E538 Deficiency of other specified B group vitamins: Secondary | ICD-10-CM

## 2020-02-25 LAB — SEDIMENTATION RATE: Sed Rate: 4 mm/hr (ref 0–30)

## 2020-02-25 LAB — VITAMIN B12: Vitamin B-12: 174 pg/mL — ABNORMAL LOW (ref 211–911)

## 2020-02-25 LAB — VITAMIN D 25 HYDROXY (VIT D DEFICIENCY, FRACTURES): VITD: 25.75 ng/mL — ABNORMAL LOW (ref 30.00–100.00)

## 2020-02-25 NOTE — Telephone Encounter (Signed)
FYI

## 2020-02-25 NOTE — Progress Notes (Signed)
v

## 2020-02-25 NOTE — Telephone Encounter (Signed)
Pt came in today for labs, she asking if Dr. Birdie Riddle could add a d-dimer to her lab orders for blood clots. I noticed that she looked grayish and when I asked her was she ok, she said no. She did tell me that Dr. Birdie Riddle knows what is going on with her and she had a video visit with her yesterday. Pt is in the office now having labs drawn.

## 2020-02-25 NOTE — Telephone Encounter (Signed)
Pt was advised by CMA in the lab before she left the office.

## 2020-02-25 NOTE — Telephone Encounter (Signed)
Please let pt know if her sxs change or worsen, she needs to be evaluated in the ER

## 2020-02-28 ENCOUNTER — Other Ambulatory Visit: Payer: Self-pay | Admitting: General Practice

## 2020-02-28 ENCOUNTER — Encounter: Payer: Self-pay | Admitting: Family Medicine

## 2020-02-28 DIAGNOSIS — M359 Systemic involvement of connective tissue, unspecified: Principal | ICD-10-CM

## 2020-02-28 DIAGNOSIS — D508 Other iron deficiency anemias: Principal | ICD-10-CM

## 2020-02-28 DIAGNOSIS — R0602 Shortness of breath: Principal | ICD-10-CM

## 2020-02-28 DIAGNOSIS — J849 Interstitial pulmonary disease, unspecified: Principal | ICD-10-CM

## 2020-02-28 LAB — ANA: Anti Nuclear Antibody (ANA): NEGATIVE

## 2020-02-28 LAB — D-DIMER, QUANTITATIVE: D-Dimer, Quant: 0.23 mcg/mL FEU (ref <0.50)

## 2020-02-28 MED ORDER — CYANOCOBALAMIN 1000 MCG/ML IJ SOLN
INTRAMUSCULAR | 8 refills | Status: DC
Start: 2020-02-28 — End: 2022-01-07

## 2020-02-28 MED ORDER — "SYRINGE/NEEDLE (DISP) 25G X 1"" 3 ML MISC": 0 refills | Status: DC

## 2020-02-28 MED ORDER — VITAMIN D (ERGOCALCIFEROL) 1.25 MG (50000 UNIT) PO CAPS
50000.0000 [IU] | ORAL_CAPSULE | ORAL | 0 refills | Status: DC
Start: 2020-02-28 — End: 2020-08-16

## 2020-02-29 ENCOUNTER — Emergency Department
Admit: 2020-02-29 | Discharge: 2020-03-01 | Disposition: A | Discharge status: Home / Self Care | Payer: MEDICARE | Attending: Emergency Medicine

## 2020-02-29 ENCOUNTER — Ambulatory Visit
Admit: 2020-02-29 | Discharge: 2020-03-01 | Disposition: A | Discharge status: Home / Self Care | Payer: MEDICARE | Attending: Emergency Medicine

## 2020-02-29 DIAGNOSIS — R0602 Shortness of breath: Secondary | ICD-10-CM | POA: Diagnosis not present

## 2020-02-29 DIAGNOSIS — Z86718 Personal history of other venous thrombosis and embolism: Secondary | ICD-10-CM | POA: Diagnosis not present

## 2020-02-29 DIAGNOSIS — R509 Fever, unspecified: Secondary | ICD-10-CM | POA: Diagnosis not present

## 2020-02-29 DIAGNOSIS — J9811 Atelectasis: Secondary | ICD-10-CM | POA: Diagnosis not present

## 2020-02-29 DIAGNOSIS — R918 Other nonspecific abnormal finding of lung field: Secondary | ICD-10-CM | POA: Diagnosis not present

## 2020-02-29 DIAGNOSIS — Z6841 Body Mass Index (BMI) 40.0 and over, adult: Secondary | ICD-10-CM | POA: Diagnosis not present

## 2020-02-29 DIAGNOSIS — R69 Illness, unspecified: Secondary | ICD-10-CM | POA: Diagnosis not present

## 2020-02-29 DIAGNOSIS — R0609 Other forms of dyspnea: Secondary | ICD-10-CM | POA: Diagnosis not present

## 2020-02-29 DIAGNOSIS — J849 Interstitial pulmonary disease, unspecified: Secondary | ICD-10-CM | POA: Diagnosis not present

## 2020-02-29 DIAGNOSIS — I10 Essential (primary) hypertension: Secondary | ICD-10-CM | POA: Diagnosis not present

## 2020-02-29 DIAGNOSIS — Z20822 Contact with and (suspected) exposure to covid-19: Secondary | ICD-10-CM | POA: Diagnosis not present

## 2020-02-29 DIAGNOSIS — J984 Other disorders of lung: Secondary | ICD-10-CM | POA: Diagnosis not present

## 2020-02-29 DIAGNOSIS — Z86711 Personal history of pulmonary embolism: Secondary | ICD-10-CM | POA: Diagnosis not present

## 2020-02-29 DIAGNOSIS — R070 Pain in throat: Secondary | ICD-10-CM | POA: Diagnosis not present

## 2020-02-29 DIAGNOSIS — R0600 Dyspnea, unspecified: Secondary | ICD-10-CM | POA: Diagnosis not present

## 2020-02-29 LAB — MEAN CORPUSCULAR HEMOGLOBIN CONC: Erythrocyte mean corpuscular hemoglobin concentration:MCnc:Pt:RBC:Qn:Automated count: 33.4

## 2020-02-29 LAB — URINALYSIS WITH CULTURE REFLEX
BACTERIA: NONE SEEN /HPF
BLOOD UA: NEGATIVE
GLUCOSE UA: NEGATIVE
KETONES UA: NEGATIVE
NITRITE UA: NEGATIVE
PH UA: 6 (ref 5.0–9.0)
RBC UA: 1 /HPF (ref <=4)
SPECIFIC GRAVITY UA: 1.012 (ref 1.003–1.030)
SQUAMOUS EPITHELIAL: 1 /HPF (ref 0–5)
UROBILINOGEN UA: 0.2
WBC UA: <1 /HPF (ref 0–5)

## 2020-02-29 LAB — CBC W/ AUTO DIFF
BASOPHILS ABSOLUTE COUNT: 0 10*9/L (ref 0.0–0.1)
BASOPHILS RELATIVE PERCENT: 0.4 %
EOSINOPHILS ABSOLUTE COUNT: 0.2 10*9/L (ref 0.0–0.4)
EOSINOPHILS RELATIVE PERCENT: 2.2 %
HEMATOCRIT: 36.9 % (ref 36.0–46.0)
HEMOGLOBIN: 12.3 g/dL (ref 12.0–16.0)
LARGE UNSTAINED CELLS: 1 % (ref 0–4)
LYMPHOCYTES ABSOLUTE COUNT: 1.5 10*9/L (ref 1.5–5.0)
MEAN CORPUSCULAR HEMOGLOBIN CONC: 33.4 g/dL (ref 31.0–37.0)
MEAN CORPUSCULAR HEMOGLOBIN: 28.1 pg (ref 26.0–34.0)
MEAN CORPUSCULAR VOLUME: 84.1 fL (ref 80.0–100.0)
MEAN PLATELET VOLUME: 7.5 fL (ref 7.0–10.0)
MONOCYTES ABSOLUTE COUNT: 0.3 10*9/L (ref 0.2–0.8)
MONOCYTES RELATIVE PERCENT: 3.3 %
NEUTROPHILS ABSOLUTE COUNT: 6.4 10*9/L (ref 2.0–7.5)
NEUTROPHILS RELATIVE PERCENT: 75.5 %
PLATELET COUNT: 351 10*9/L (ref 150–440)
RED BLOOD CELL COUNT: 4.39 10*12/L (ref 4.00–5.20)
RED CELL DISTRIBUTION WIDTH: 14.3 % (ref 12.0–15.0)

## 2020-02-29 LAB — COMPREHENSIVE METABOLIC PANEL
ALBUMIN: 4.4 g/dL (ref 3.5–5.0)
ALKALINE PHOSPHATASE: 82 U/L (ref 38–126)
ALT (SGPT): 18 U/L (ref <35)
ANION GAP: 9 mmol/L (ref 7–15)
AST (SGOT): 22 U/L (ref 14–38)
BILIRUBIN TOTAL: 0.5 mg/dL (ref 0.0–1.2)
BLOOD UREA NITROGEN: 13 mg/dL (ref 7–21)
BUN / CREAT RATIO: 22
CALCIUM: 9.6 mg/dL (ref 8.5–10.2)
CHLORIDE: 103 mmol/L (ref 98–107)
CO2: 28 mmol/L (ref 22.0–30.0)
CREATININE: 0.59 mg/dL — ABNORMAL LOW (ref 0.60–1.00)
EGFR CKD-EPI NON-AA FEMALE: >90 mL/min/{1.73_m2} (ref >=60)
GLUCOSE RANDOM: 113 mg/dL (ref 70–179)
POTASSIUM: 4.5 mmol/L (ref 3.5–5.0)
PROTEIN TOTAL: 7 g/dL (ref 6.5–8.3)
SODIUM: 140 mmol/L (ref 135–145)

## 2020-02-29 LAB — LACTATE BLOOD VENOUS: Lactate:SCnc:Pt:BldV:Qn:: 1.7

## 2020-02-29 LAB — CREATINE KINASE TOTAL: Creatine kinase:CCnc:Pt:Ser/Plas:Qn:: 37 — ABNORMAL LOW

## 2020-02-29 LAB — TROPONIN I
TROPONIN I: <0.034 ng/mL (ref <0.034)
Troponin I.cardiac:MCnc:Pt:Ser/Plas:Qn:: <0.034
Troponin I.cardiac:MCnc:Pt:Ser/Plas:Qn:: <0.034

## 2020-02-29 LAB — CO2: Carbon dioxide:SCnc:Pt:Ser/Plas:Qn:: 28

## 2020-02-29 LAB — PRO-BNP
Natriuretic peptide.B prohormone N-Terminal:MCnc:Pt:Ser/Plas:Qn:: 122
PRO-BNP: 122 pg/mL (ref 0.0–353.0)

## 2020-02-29 LAB — MAGNESIUM: Magnesium:MCnc:Pt:Ser/Plas:Qn:: 1.7

## 2020-02-29 LAB — UROBILINOGEN UA: Lab: 0.2

## 2020-02-29 LAB — APTT: Coagulation surface induced:Time:Pt:PPP:Qn:Coag: 30.7

## 2020-02-29 LAB — PROTIME: Coagulation tissue factor induced:Time:Pt:PPP:Qn:Coag: 12.4

## 2020-02-29 MED ADMIN — MORPhine injection 2 mg: 2 mg | INTRAVENOUS | @ 22:00:00 | Stop: 2020-02-29

## 2020-02-29 MED ADMIN — acetaminophen (TYLENOL) tablet 1,000 mg: 1000 mg | ORAL | @ 20:00:00 | Stop: 2020-02-29

## 2020-02-29 MED ADMIN — iohexoL (OMNIPAQUE) 350 mg iodine/mL solution 75 mL: 75 mL | INTRAVENOUS | @ 23:00:00 | Stop: 2020-02-29

## 2020-02-29 NOTE — Unmapped (Signed)
Pt states that she has ILD, and was sent here from her pulmonologist for workup of her SOB

## 2020-02-29 NOTE — Unmapped (Signed)
Patient called today and discussed symptoms  - body aches and fatigue for >1 week  - fever 101 overnight with sore throat  - more SOB but pulse Ox 100% at home but putting O2 on for symptoms  - advised to come to ER for COVID screen and infectious work-up and chest imiging and blood work  - Immunosupressed on cellcept

## 2020-02-29 NOTE — Unmapped (Signed)
[facility]  Emergency Department Provider Note      ED Clinical Impression      Final diagnoses:   Dyspnea, unspecified type (Primary)           Impression, ED Course, Assessment and Plan      Initial clinical impression: Dyspnea    Amber Bush is a 64 y.o. 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  Patient also reports fever with T-max 101 Fahrenheit overnight at home.     On exam, patient is mildly tachypneic but in no acute distress.  Lungs reveal rales in the left base.  Heart is regular rate and rhythm.  Abdomen is soft, nondistended, nontender.  No peripheral edema.    Differential diagnosis includes ILD flare, pneumonia, pleural effusion, PE, ACS,     Diagnostic testing: will obtain chest pain work-up and CTA of the chest    Treatment with Tylenol    Patient call presentation concerning for pulmonary versus cardiovascular etiology of increased work of breathing.  This could represent Amber Bush ILD flare versus pneumonia.  She is immunocompromise with reported fever overnight.  Also reports rigors which is concerning.  Will obtain chest pain work-up, blood cultures, and CT of the chest to evaluate for PE.  If work up is otherwise negative, anticipate discharge with PCP and pulmonology follow up.    Disposition/consultation: Discharge    Case discussed with ED attending Dr. Debarah Crape who is agreeable with the plan.          Additional Medical Decision Making     This patient was evaluated in Emergency Department at the time of this visit for the symptoms described in the history of present illness. They were evaluated in the context of the global COVID-19 pandemic, which necessitated consideration that the patient might be at risk for infection with the SARS-CoV-2 virus that causes COVID-19. Institutional protocols and algorithms that pertain to the evaluation of patients at risk for COVID-19 were followed during the patient's care in the ED.    I have reviewed the vital signs and the nursing notes. Labs and radiology results that were available during my care of the patient were independently reviewed by me and considered in my medical decision making.   I directly visualized and independently interpreted the EKG tracing.   I independently visualized the radiology images.   I reviewed the patient's prior medical records.   I discussed the case with the pulmonology consultant.   I discussed the case and plan for continuity of care with the admitting provider.   I staffed the case with the ED attending, Dr. Debarah Crape.       History        Chief Complaint  Shortness of Breath      HPI   Amber Bush is a 64 y.o. 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  Patient also reports fever with T-max 101 Fahrenheit overnight at home.  Patient does not usually wear oxygen, but is prescribed 1 to 2 L as needed.  Oxygen saturations have been appropriate at home on room air, patient has been wearing 1 L by nasal cannula for comfort  over the past few days.  She called her pulmonologist Dr. Dudley Major who recommended presentation to the emergency department for further evaluation.  Patient denies known sick contacts, chest pain, nausea, vomiting, abdominal pain, dysuria, hematuria, and rash.    Past Medical History:   Diagnosis Date   ??? Anxiety    ??? Colon polyp    ??? Depression    ??? Diverticulitis of colon    ??? DVT (deep vein thrombosis) in pregnancy    ??? Fibrocystic breast    ??? Hypertension    ??? ILD (interstitial lung disease) (CMS-HCC)    ??? Interstitial lung disease (CMS-HCC)    ??? OSA (obstructive sleep apnea)    ??? PE (pulmonary thromboembolism) (CMS-HCC)     on xarelto   ??? Sleep apnea        Patient Active Problem List   Diagnosis   ??? SOB (shortness of breath)   ??? ILD (interstitial lung disease) (CMS-HCC)       Past Surgical History:   Procedure Laterality Date   ??? CHOLECYSTECTOMY     ??? HYSTERECTOMY     ??? PR ESOPHAGEAL MOTILITY STUDY, MANOMETRY N/A 01/16/2017    Procedure: ESOPHAGEAL MOTILITY STUDY W/INT & REP;  Surgeon: Nurse-Based Giproc;  [facility];  Service: Gastroenterology   ??? PR GERD TST W/ NASAL IMPEDENCE ELECTROD N/A 01/16/2017    Procedure: ESOPH FUNCT TST NASL ELEC PLCMT;  Surgeon: Nurse-Based Giproc;  Location: [facility];  Service: Gastroenterology   ??? PR GERD TST W/ NASAL PH ELECTROD N/A 01/16/2017    Procedure: ESOPHAGEAL 24 HOUR PH MONITORING;  Surgeon: Nurse-Based Giproc;  [facility]: [facility];  Service: Gastroenterology   ??? PR UP GI ENDOSCOPY,BALL DIL,30MM N/A 10/28/2017    Procedure: UGI ENDO; W/BALLOON DILAT ESOPHAGUS (<30MM DIAM);  Surgeon: Liane Comber, MD;  Location: [facility];  Service: Gastroenterology   ??? PR UPPER GI ENDOSCOPY,BIOPSY Left 10/28/2017    Procedure: UGI ENDOSCOPY; WITH BIOPSY, SINGLE OR MULTIPLE;  Surgeon: Liane Comber, MD;  Location: [facility];  Service: Gastroenterology   ??? ROTATOR CUFF REPAIR Right 07/2016       No current facility-administered medications for this encounter.    Current Outpatient Medications:   ???  ALPRAZolam (XANAX) 1 MG tablet, Take 2 mg by mouth nightly. 1/2 tab in the morning., Disp: , Rfl:   ???  amLODIPine (NORVASC) 5 MG tablet, Take 2.5 mg by mouth daily. 2.5 mg QHS, Disp: , Rfl:   ???  biotin 10,000 mcg cap, Take 10,000 mcg by mouth Two (2) times a day., Disp: , Rfl:   ???  cetirizine (ZYRTEC) 10 MG tablet, Take 10 mg by mouth daily., Disp: , Rfl:   ???  fenofibrate (LOFIBRA) 160 MG tablet, Take 160 mg by mouth daily., Disp: , Rfl:   ???  FLUoxetine (PROZAC) 40 MG capsule, Take 40 mg by mouth daily. 40 mg once daily (Patient not taking: Reported on 01/17/2020), Disp: , Rfl:   ???  fluticasone (FLONASE) 50 mcg/actuation nasal spray, 1 spray by Each Nare route daily., Disp: , Rfl:   ???  lamoTRIgine (LAMICTAL) 100 MG tablet, Take 100 mg by mouth daily. Taking 1/2 tablet (50 mg) once daily, Disp: , Rfl:   ???  lisinopril (PRINIVIL,ZESTRIL) 20 MG tablet, Take 20 mg by mouth Two (2) times a day. , Disp: , Rfl:   ???  mycophenolate (CELLCEPT) 500 mg tablet, Take 1 tablet (500  mg total) by mouth Two (2) times a day., Disp: 60 tablet, Rfl: 11  ???  predniSONE (DELTASONE) 2.5 MG tablet, Take 1 tablet (2.5 mg total) by mouth daily., Disp: 30 tablet, Rfl: 11  ???  promethazine (PHENERGAN) 25 MG tablet, Take 25 mg by mouth every six (6) hours as needed for nausea., Disp: , Rfl:   ???  temAZEpam (RESTORIL) 30 mg capsule, Take 30 mg by mouth nightly as needed for sleep., Disp: , Rfl:   ???  vortioxetine (TRINTELLIX) 20 mg tablet, Take 20 mg by mouth daily., Disp: , Rfl:     Allergies  Sulfa (sulfonamide antibiotics) and Sulfasalazine    Family History   Problem Relation Age of Onset   ??? Heart disease Mother    ??? Hypertension Mother    ??? Kidney disease Mother    ??? Heart disease Father    ??? Hypertension Father    ??? Kidney disease Father        Social History  Social History     Tobacco Use   ??? Smoking status: Never Smoker   ??? Smokeless tobacco: Never Used   Substance Use Topics   ??? Alcohol use: No     Alcohol/week: 0.0 standard drinks   ??? Drug use: No       Review of Systems  Constitutional: Positive for fever.  Eyes: Negative for visual changes.  ENT: Negative for sore throat.  Cardiovascular: Negative for chest pain.  Respiratory: Positive for shortness of breath.  Gastrointestinal: Negative for abdominal pain, vomiting or diarrhea.  Genitourinary: Negative for dysuria.   Musculoskeletal: Negative for back pain.  Skin: Negative for rash.  Neurological: Negative for headaches, focal weakness or numbness.    All other systems have been reviewed and are negative except as otherwise documented.     Physical Exam     ED Triage Vitals   Enc Vitals Group      BP 02/29/20 1317 128/71      Heart Rate 02/29/20 1317 87      SpO2 Pulse 02/29/20 1504 82      Resp 02/29/20 1317 26      Temp 02/29/20 1317 36.7 ??C (98.1 ??F)      Temp Source 02/29/20 1504 Oral      SpO2 02/29/20 1317 92 %      Weight 02/29/20 1448 (!) 113.2 kg (249 lb 9 oz)      Height 02/29/20 1448 1.626 m (5' 4)      Head Circumference --       Peak Flow --       Pain Score --       Pain Loc --       Pain Edu? --       Excl. in GC? --        Constitutional: Alert and oriented. Chronically ill appearing and in no distress.  Eyes: Conjunctivae are normal.  ENT       Head: Normocephalic and atraumatic.       Nose: No congestion.       Mouth/Throat: Mucous membranes are moist.       Neck: No stridor.  Hematological/Lymphatic/Immunilogical: No cervical lymphadenopathy.  Cardiovascular: Normal rate, regular rhythm. Normal and symmetric distal pulses are present in all extremities.  Respiratory: Normal respiratory effort. Breath sounds reveal rales in the left base.  Gastrointestinal: Soft and nontender. There is no CVA tenderness.  Genitourinary: Deferred  Musculoskeletal: Normal range of motion in all extremities.  Right lower leg: No tenderness or edema.       Left lower leg: No tenderness or edema.  Neurologic: Normal speech and language. No gross focal neurologic deficits are appreciated.  Skin: Skin is warm, dry and intact. No rash noted.  Psychiatric: Mood and affect are normal. Speech and behavior are normal.     EKG     EKG: Normal sinus rhythm.  Normal rate. No ST or T wave changes to indicate ischemia.  No PR, QT, or QRS interval prolongation.      Radiology     CTA Chest W Contrast   Final Result      No acute pulmonary embolism.      Mosaic pulmonary attenuation and bronchial wall thickening with mild groundglass opacities in the anterior upper lobes, similar since 12/12/2017 and may reflect small airway disease or chronic HP.       XR Chest Portable   Final Result      Linear left basilar opacities, scarring/atelectasis.           Labs     Labs Reviewed   COMPREHENSIVE METABOLIC PANEL - Abnormal; Notable for the following components:       Result Value    Creatinine 0.59 (*)     All other components within normal limits   CK - Abnormal; Notable for the following components:    Creatine Kinase, Total 37.0 (*)     All other components within normal limits   CBC W/ AUTO DIFF - Abnormal; Notable for the following components:    Hypochromasia Slight (*)     All other components within normal limits    Narrative:     Please use the Absolute Differential for reference ranges.    RAPID INFLUENZA/RSV/COVID PCR - Normal    Narrative:     This test was performed using the Cepheid Xpert Xpress SARS-CoV-2/Flu/RSV assay, which has been validated by the CLIA-certified, CAP-inspected [employer] FDA has granted Emergency Use Authorization for this test. Negative results do not preclude infection and should be interpreted along with clinical observations, patient history, and epidemiological information. Information for providers and patients can be found here: [email or web address]   TROPONIN I - Normal   PRO-BNP - Normal   LACTATE, VENOUS, WHOLE BLOOD - Normal   MAGNESIUM - Normal   TROPONIN I - Normal   BLOOD CULTURE   BLOOD CULTURE   PROTIME-INR   APTT   CBC W/ DIFFERENTIAL    Narrative:     The following orders were created for panel order CBC w/ Differential.  Procedure                               Abnormality         Status                     ---------                               -----------         ------                     CBC w/ [id number]]         Abnormal            Final result  Please view results for these tests on the individual orders.   URINALYSIS WITH CULTURE REFLEX       Pertinent labs & imaging results that were available during my care of the patient were reviewed by me and considered in my medical decision making (see chart for details).    Please note- This chart has been created using [employer]. Chart creation errors have been sought, but may not always be located and such creation errors, especially pronoun confusion, do NOT reflect on the standard of medical care.       Javan Gonzaga Deberah Castle, MD  Resident  02/29/20 2136

## 2020-03-02 MED ORDER — PREDNISONE 10 MG TABLET
ORAL_TABLET | Freq: Every day | ORAL | 0 refills | 10.00000 days | Status: CP
Start: 2020-03-02 — End: 2020-03-12

## 2020-03-02 MED ORDER — PREDNISONE 10 MG TABLET: 10 mg | tablet | Freq: Every day | 0 refills | 10 days | Status: AC

## 2020-03-02 MED ORDER — TRAMADOL 50 MG TABLET
ORAL_TABLET | Freq: Four times a day (QID) | ORAL | 0 refills | 14.00000 days | Status: CP
Start: 2020-03-02 — End: 2020-03-02

## 2020-03-02 MED ORDER — TRAMADOL 50 MG TABLET: 50 mg | tablet | Freq: Four times a day (QID) | 0 refills | 14 days | Status: AC

## 2020-03-02 NOTE — Unmapped (Signed)
Blood Culture  Order: [id number] and Order: [id number]  Status: ??Preliminary result ????  ??  ??  No Growth at 48 hours

## 2020-03-02 NOTE — Unmapped (Signed)
Blood Culture  Order: [id number] and Order: [id number]  Status:  Preliminary result ????      No Growth at 24 hours

## 2020-03-04 DIAGNOSIS — R062 Wheezing: Secondary | ICD-10-CM | POA: Diagnosis not present

## 2020-03-04 DIAGNOSIS — J849 Interstitial pulmonary disease, unspecified: Secondary | ICD-10-CM | POA: Diagnosis not present

## 2020-03-04 DIAGNOSIS — G4733 Obstructive sleep apnea (adult) (pediatric): Secondary | ICD-10-CM | POA: Diagnosis not present

## 2020-03-04 NOTE — Unmapped (Signed)
Blood Culture  Order: [id number] and Order: [id number]  Status:  Preliminary result ????    Culture, Routine   No Growth at 4 days

## 2020-03-04 NOTE — Unmapped (Signed)
Blood Culture  Order: [id number] and Order: [id number]  Status: ??Preliminary result ????  ??  ??  No Growth at 72 hours

## 2020-03-05 DIAGNOSIS — J849 Interstitial pulmonary disease, unspecified: Secondary | ICD-10-CM | POA: Diagnosis not present

## 2020-03-05 DIAGNOSIS — G4733 Obstructive sleep apnea (adult) (pediatric): Secondary | ICD-10-CM | POA: Diagnosis not present

## 2020-03-05 DIAGNOSIS — R062 Wheezing: Secondary | ICD-10-CM | POA: Diagnosis not present

## 2020-03-05 NOTE — Unmapped (Signed)
Blood Culture  Order: [id number] and Order: [id number]  Status: ??Final result ????  ??  Culture, Routine   No Growth at 5 days

## 2020-03-06 ENCOUNTER — Telehealth: Payer: Self-pay | Admitting: Psychiatry

## 2020-03-06 ENCOUNTER — Encounter: Payer: Self-pay | Admitting: Family Medicine

## 2020-03-06 NOTE — Telephone Encounter (Signed)
Pt called stated having shortness of breath. Started Modafinil 6/7 visit. Please advise ASAP [phone]

## 2020-03-06 NOTE — Telephone Encounter (Signed)
Noted patient also spoke with Dr. Birdie Riddle today because she recently started Lyrica. See note on her recommendation as well.

## 2020-03-09 ENCOUNTER — Telehealth (INDEPENDENT_AMBULATORY_CARE_PROVIDER_SITE_OTHER): Payer: Medicare HMO | Admitting: Psychiatry

## 2020-03-09 DIAGNOSIS — F332 Major depressive disorder, recurrent severe without psychotic features: Secondary | ICD-10-CM | POA: Diagnosis not present

## 2020-03-09 DIAGNOSIS — F411 Generalized anxiety disorder: Secondary | ICD-10-CM

## 2020-03-09 DIAGNOSIS — R69 Illness, unspecified: Secondary | ICD-10-CM | POA: Diagnosis not present

## 2020-03-09 NOTE — Progress Notes (Signed)
Sukhmani Fetherolf [id number] 25-Aug-1956 64 y.o.  Virtual Visit via Telephone Note  I connected with pt on 03/09/20 at  9:00 AM EDT by telephone and verified that I am speaking with the correct person using two identifiers.   I discussed the limitations, risks, security and privacy concerns of performing an evaluation and management service by telephone and the availability of in person appointments. I also discussed with the patient that there may be a patient responsible charge related to this service. The patient expressed understanding and agreed to proceed.   I discussed the assessment and treatment plan with the patient. The patient was provided an opportunity to ask questions and all were answered. The patient agreed with the plan and demonstrated an understanding of the instructions.   The patient was advised to call back or seek an in-person evaluation if the symptoms worsen or if the condition fails to improve as anticipated.  I provided 30 minutes of non-face-to-face time during this encounter.  The patient was located at home.  The provider was located at [place].   Thayer Headings, PMHNP   Subjective:   Patient ID:  Beverly Ferner is a 64 y.o. (DOB [birth year]) female.  Chief Complaint:  Chief Complaint  Patient presents with  . Depression  . Anxiety    HPI Tanesia Butner presents for follow-up of depression and anxiety. She reports that she had episode of shortness of breath last week and oxygen saturation decreased to 80%. She reports that DVT was ruled out. She reports that she has been coming off Lyrica and Modafanil since these medications were started around the time shortness of breath started. She reports that earlier this week she had difficulty with stopping these medications to include chills, n/v, and hot flashes. She reports that these s/s have improved.   She reports that she has completed [place] and has not seen a significant improvement in her depression.  She reports continued rumination and negative thoughts. Reports constant worry. Denies panic attacks. She reports, "I don't get enthused about much." She reports low motivation and energy and feels that she is not getting much accomplished. Has not been wanting to be around people. Has started to have groceries delivered to avoid going out. Has not been maintaining friendships. Decreased interest in things. She reports anhedonia. She reports that she has had some increased awakenings. She reports that her appetite has been decreased. She reports poor concentration and starts tasks without completing them. Denies SI.   Reports that she has a friend that is dying from [place].   Past Psychiatric Medication Trials: (Reports sensitivity to medication) Lamictal- reports that 150 mg felt that this was too high. Paxil- Took years ago in combination with other medications Lexapro-ineffective Zoloft- ineffective Prozac- Has tremor and unsteadiness on 80 mg. Has taken about 5 years. Unsure of benefit. Effexor XR- adverse reaction. Confusion. Remeron- Wt gain. Trintellix- GI side effects Wellbutrin- rash during clinical trials.  Gabapentin- felt unsteady. Suppressed breathing.  Lyrica-Suppressed breathing. Temazepam Xanax Lithium-Ineffective Abilify-Ineffective.  Risperdal- wt gain Viibryd- Reports that Dr. Toy Care has told her that she was on it and she does not recall this.   Not sure if she has taken Cymbalta.  Review of Systems:  Review of Systems  Respiratory: Negative for shortness of breath.        Reports SOB has significantly improved  Musculoskeletal: Negative for gait problem.       Pain in knee and hip. Reports heaviness in her legs at night.  Neurological: Negative for tremors.  Psychiatric/Behavioral:       Please refer to HPI    Medications: I have reviewed the patient's current medications.  Current Outpatient Medications  Medication Sig Dispense Refill  . ALPRAZolam (XANAX)  1 MG tablet Take 1 tablet (1 mg total) by mouth 4 (four) times daily as needed for anxiety. 360 tablet 0  . amLODipine (NORVASC) 5 MG tablet TAKE 1/2 TABLET (2.5 MG) IN THE MORNING AND 1 WHOLE TABLET (5 MG) AT BEDTIME. (Patient taking differently: 1 WHOLE TABLET (5 MG) AT BEDTIME.) 135 tablet 3  . butalbital-aspirin-caffeine-codeine (FIORINAL WITH CODEINE) 50-325-40-30 MG capsule TAKE ONE CAPSULE BY MOUTH EVERY 8 HOURS AS NEEDED FOR MIGRAINE 21 capsule 0  . cetirizine (ZYRTEC) 10 MG tablet Take 10 mg by mouth daily.    . Cholecalciferol (VITAMIN D) 50 MCG (2000 UT) CAPS Take by mouth.    . cyanocobalamin (,VITAMIN B-12,) 1000 MCG/ML injection Inject 1 mL into the skin once a week for 4 weeks, then once a month for 5 months. 1 mL 8  . fenofibrate 160 MG tablet TAKE ONE TABLET BY MOUTH DAILY 90 tablet 0  . fluticasone (FLONASE) 50 MCG/ACT nasal spray Place 1 spray into both nostrils daily.    Marland Kitchen lamoTRIgine (LAMICTAL) 100 MG tablet Take 1 tablet (100 mg total) by mouth daily. 90 tablet 0  . lisinopril (ZESTRIL) 20 MG tablet Take 1 tablet (20 mg total) by mouth daily. 180 tablet 0  . mycophenolate (CELLCEPT) 500 MG tablet Take 500 mg by mouth 2 (two) times daily.     . pravastatin (PRAVACHOL) 40 MG tablet TAKE ONE TABLET BY MOUTH DAILY 90 tablet 0  . predniSONE (DELTASONE) 2.5 MG tablet Take 0.25 mg by mouth daily with breakfast.   11  . temazepam (RESTORIL) 30 MG capsule Take 1 capsule (30 mg total) by mouth at bedtime. 90 capsule 0  . Vitamin D, Ergocalciferol, (DRISDOL) 1.25 MG (50000 UNIT) CAPS capsule Take 1 capsule (50,000 Units total) by mouth every 7 (seven) days. 12 capsule 0  . DULoxetine (CYMBALTA) 20 MG capsule Take 1 capsule po q am for 2 weeks, then increase to 2 capsules daily 30 capsule 0  . OXYGEN Inhale 2 L into the lungs as needed (SOB).    . pantoprazole (PROTONIX) 40 MG tablet TAKE ONE TABLET BY MOUTH TWICE A DAY (Patient taking differently: Take 40 mg by mouth daily. ) 180 tablet 1   . pregabalin (LYRICA) 75 MG capsule Take 1 capsule (75 mg total) by mouth 2 (two) times daily. (Patient not taking: Reported on 03/09/2020) 60 capsule 1  . promethazine (PHENERGAN) 25 MG tablet TAKE ONE TABLET BY MOUTH EVERY 8 HOURS AS NEEDED FOR FOR NAUSEA AND VOMITING 30 tablet 0  . SYRINGE-NEEDLE, DISP, 3 ML 25G X 1" 3 ML MISC Use to administer B12 injections 1 time a week for 4 weeks, then once a month for 5 months. 10 each 0  . traMADol (ULTRAM) 50 MG tablet Take 50 mg by mouth every 8 (eight) hours as needed. (Patient not taking: Reported on 03/09/2020)     No current facility-administered medications for this visit.    Medication Side Effects: Other: Questions if shortness of breath was due to Lyrica or Modafanil  Allergies:  Allergies  Allergen Reactions  . Macrobid [Nitrofurantoin Monohyd Macro] Diarrhea    Diarrhea, GI upset  . Sulfa Antibiotics Rash    Past Medical History:  Diagnosis Date  . Allergy   .  Anxiety   . Arthritis    KNEES,BACK,HIPS  . Depression   . Fibromyalgia   . GERD (gastroesophageal reflux disease)   . Hyperlipidemia   . Hypertension   . IBS (irritable bowel syndrome)   . Interstitial lung disease ([place])   . Pulmonary embolism ([place]) 09/2016   provoked- s/p rotator cuff repair  . Sleep apnea     Family History  Problem Relation Age of Onset  . Diabetes Mother   . Heart disease Mother   . Anxiety disorder Mother   . Depression Mother   . Kidney disease Mother   . Diabetes Father   . Heart disease Father   . Anxiety disorder Father   . Depression Father   . Diabetes Paternal Grandmother   . Cystic fibrosis Son   . Alcohol abuse Son   . Drug abuse Son   . Anxiety disorder Son   . Depression Son   . Breast cancer Maternal Aunt   . Breast cancer Maternal Aunt     Social History   Socioeconomic History  . Marital status: Widowed    Spouse name: Not on file  . Number of children: 2  . Years of education: 12  . Highest education  level: High school graduate  Occupational History  . 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 Smoking status: Former Research scientist (life sciences)  . Smokeless tobacco: Never Used  . Tobacco comment: smoked 2 years in late teens about 8-10 cigs daily.   Vaping Use  . Vaping Use: Never used  Substance and Sexual Activity  . Alcohol use: No    Alcohol/week: 0.0 standard drinks  . Drug use: No  . Sexual activity: Not on file  Other Topics Concern  . Not on file  Social History Narrative   Lives alone, widowed. Has one dtr. in [place]. [place] and one son in [place]   Social Determinants of Health   Financial Resource Strain:   . Difficulty of Paying Living Expenses:   Food Insecurity: No Food Insecurity  . Worried About Charity fundraiser in the Last Year: Never true  . Ran Out of Food in the Last Year: Never true  Transportation Needs: No Transportation Needs  . Lack of Transportation (Medical): No  . Lack of Transportation (Non-Medical): No  Physical Activity:   . Days of Exercise per Week:   . Minutes of Exercise per Session:   Stress:   . Feeling of Stress :   Social Connections:   . Frequency of Communication with Friends and Family:   . Frequency of Social Gatherings with Friends and Family:   . Attends Religious Services:   . Active Member of Clubs or Organizations:   . Attends Archivist Meetings:   Marland Kitchen Marital Status:   Intimate Partner Violence:   . Fear of Current or Ex-Partner:   . Emotionally Abused:   Marland Kitchen Physically Abused:   . Sexually Abused:     Past Medical History, Surgical history, Social history, and Family history were reviewed and updated as appropriate.   Please see review of systems for further details on the patient's review from today.   Objective:   Physical Exam:  There were no vitals taken for this visit.  Physical Exam Neurological:     Mental Status: She is alert and oriented to person, place, and time.     Cranial  Nerves: No dysarthria.  Psychiatric:        Attention and  Perception: Attention and perception normal.        Mood and Affect: Mood is anxious and depressed.        Speech: Speech normal.        Behavior: Behavior is cooperative.        Thought Content: Thought content normal. Thought content is not paranoid or delusional. Thought content does not include homicidal or suicidal ideation. Thought content does not include homicidal or suicidal plan.        Cognition and Memory: Cognition and memory normal.        Judgment: Judgment normal.     Comments: Insight intact     Lab Review:     Component Value Date/Time   NA 138 06/14/2019 1509   K 4.7 06/14/2019 1509   CL 100 06/14/2019 1509   CO2 28 06/14/2019 1509   GLUCOSE 98 06/14/2019 1509   BUN 16 06/14/2019 1509   CREATININE 0.75 06/14/2019 1509   CALCIUM 10.1 06/14/2019 1509   PROT 7.1 06/14/2019 1509   ALBUMIN 4.6 06/14/2019 1509   AST 10 06/14/2019 1509   ALT 9 06/14/2019 1509   ALKPHOS 65 06/14/2019 1509   BILITOT 0.5 06/14/2019 1509   GFRNONAA >60 09/13/2018 0255   GFRAA >60 09/13/2018 0255       Component Value Date/Time   WBC 8.5 06/14/2019 1509   RBC 4.67 06/14/2019 1509   HGB 13.1 06/14/2019 1509   HCT 39.0 06/14/2019 1509   PLT 329.0 06/14/2019 1509   MCV 83.6 06/14/2019 1509   MCH 27.4 09/15/2018 0808   MCHC 33.4 06/14/2019 1509   RDW 14.3 06/14/2019 1509   LYMPHSABS 1.6 06/14/2019 1509   MONOABS 0.4 06/14/2019 1509   EOSABS 0.2 06/14/2019 1509   BASOSABS 0.1 06/14/2019 1509    No results found for: POCLITH, LITHIUM   No results found for: PHENYTOIN, PHENOBARB, VALPROATE, CBMZ   .res Assessment: Plan:   30 minutes spent and staffing the case with Dr. Clovis Pu.  Discussed potential benefits, risks, and side effects of Cymbalta with patient and discussed that she has tried and failed multiple SSRIs and has not had an adequate trial of an SNRI since she only took Effexor XR briefly due to adverse reaction.   Patient agrees to trial of Cymbalta.  Discussed starting with low-dose Cymbalta since she has had adverse effects with multiple medications in the past, and will titrate to 40 mg as tolerated. Will continue all other medications as prescribed. Patient reports that she will call in a couple of weeks to report how she is tolerating Cymbalta. Patient follow-up in 4 weeks or sooner if clinically indicated. Patient advised to contact office with any questions, adverse effects, or acute worsening in signs and symptoms.  [place] was seen today for depression and anxiety.  Diagnoses and all orders for this visit:  Severe episode of recurrent major depressive disorder, without psychotic features ([place]) -     DULoxetine (CYMBALTA) 20 MG capsule; Take 1 capsule po q am for 2 weeks, then increase to 2 capsules daily  Generalized anxiety disorder -     DULoxetine (CYMBALTA) 20 MG capsule; Take 1 capsule po q am for 2 weeks, then increase to 2 capsules daily    Please see After Visit Summary for patient specific instructions.  Future Appointments  Date Time Provider [place]  03/28/2020 10:30 AM Midge Minium, MD [facility]  06/05/2020  1:00 PM [facility] PHARMACIST [facility]    No orders of  the defined types were placed in this encounter.     -------------------------------

## 2020-03-10 ENCOUNTER — Telehealth: Payer: Self-pay | Admitting: Psychiatry

## 2020-03-10 MED ORDER — DULOXETINE HCL 20 MG PO CPEP: ORAL_CAPSULE | ORAL | 0 refills | Status: DC

## 2020-03-10 NOTE — Telephone Encounter (Signed)
Called pt to discuss plan. Please refer to office note.

## 2020-03-10 NOTE — Telephone Encounter (Signed)
Stacey Alvarez called stating that after the telehealth appt, yesterday you had indicated that you would call her later to follow up with her later in the day.  She hasn't heard anything and didn't want to go the weekend without having answers.  Please call today before you leave for the weekend.

## 2020-03-11 ENCOUNTER — Encounter: Payer: Self-pay | Admitting: Family Medicine

## 2020-03-16 ENCOUNTER — Ambulatory Visit: Payer: Medicare HMO | Admitting: Family Medicine

## 2020-03-21 DIAGNOSIS — R69 Illness, unspecified: Secondary | ICD-10-CM | POA: Diagnosis not present

## 2020-03-25 ENCOUNTER — Other Ambulatory Visit: Payer: Self-pay | Admitting: Physician Assistant

## 2020-03-25 ENCOUNTER — Other Ambulatory Visit: Payer: Self-pay | Admitting: Family Medicine

## 2020-03-27 ENCOUNTER — Telehealth: Payer: Self-pay | Admitting: Psychiatry

## 2020-03-27 ENCOUNTER — Other Ambulatory Visit: Payer: Self-pay

## 2020-03-27 DIAGNOSIS — F411 Generalized anxiety disorder: Secondary | ICD-10-CM

## 2020-03-27 DIAGNOSIS — F332 Major depressive disorder, recurrent severe without psychotic features: Secondary | ICD-10-CM

## 2020-03-27 DIAGNOSIS — M359 Systemic involvement of connective tissue, unspecified: Principal | ICD-10-CM

## 2020-03-27 MED ORDER — DULOXETINE HCL 20 MG PO CPEP: 40.0000 mg | ORAL_CAPSULE | Freq: Every day | ORAL | 1 refills | Status: DC

## 2020-03-27 MED ORDER — BUTALBITAL-ASA-CAFF-CODEINE 50-325-40-30 MG PO CAPS: 1.0000 | ORAL_CAPSULE | Freq: Four times a day (QID) | ORAL | 0 refills | Status: DC | PRN

## 2020-03-27 MED ORDER — PROMETHAZINE HCL 25 MG PO TABS: ORAL_TABLET | ORAL | 0 refills | Status: DC

## 2020-03-27 MED ORDER — DULOXETINE HCL 40 MG PO CPEP: ORAL_CAPSULE | ORAL | 1 refills | Status: DC

## 2020-03-27 MED ORDER — MYCOPHENOLATE MOFETIL 500 MG TABLET
ORAL_TABLET | Freq: Two times a day (BID) | ORAL | 11 refills | 30 days | Status: CP
Start: 2020-03-27 — End: 2021-03-27
  Filled 2020-03-29: qty 60, 30d supply, fill #0

## 2020-03-27 NOTE — Unmapped (Signed)
[facility] Refill Coordination Note    Specialty Medication(s) to be Shipped:   General Specialty: mycophenolate 500mg     Other medication(s) to be shipped: N/A     Amber Bush, DOB: 05/27/1956  Phone: [phone] (home)       All above HIPAA information was verified with patient.     Was a Nurse, learning disability used for this call? No    Completed refill call assessment today to schedule patient's medication shipment from the [facility] [phone]).       Specialty medication(s) and dose(s) confirmed: Regimen is correct and unchanged.   Changes to medications: [name] reports starting the following medications: Cymbalta and stopped Trintellix  Changes to insurance: No  Questions for the pharmacist: No    Confirmed patient received Welcome Packet with first shipment. The patient will receive a drug information handout for each medication shipped and additional FDA Medication Guides as required.       DISEASE/MEDICATION-SPECIFIC INFORMATION        N/A    SPECIALTY MEDICATION ADHERENCE     Medication Adherence    Patient reported X missed doses in the last month: 0  Specialty Medication: Mycophenolate 500mg   Patient is on additional specialty medications: No                Mycophenolate 500 mg: 7 days of medicine on hand         SHIPPING     Shipping address confirmed in Epic.     Delivery Scheduled: Yes, Expected medication delivery date: 03/30/20.  However, Rx request for refills was sent to the provider as there are none remaining.     Medication will be delivered via UPS to the prescription address in Epic WAM.    Nancy Nordmann [facility] Specialty Technician

## 2020-03-27 NOTE — Telephone Encounter (Signed)
Updated Rx and submitted to pharmacy.

## 2020-03-27 NOTE — Telephone Encounter (Signed)
Last OV 02/23/20 [facility] last filled 01/21/20 #21 with 0

## 2020-03-27 NOTE — Telephone Encounter (Signed)
Pt called requesting refill for Cymbalta now taking 40 mg and working well. Kristopher Oppenheim @ [facility] [employer]

## 2020-03-27 NOTE — Telephone Encounter (Signed)
Kristopher Oppenheim called to say that the Cymbalta would be cheaper for the patient if we RXed 2- 20mg  tabs instead of 1-40mg  tab. Can we change the RX?

## 2020-03-27 NOTE — Telephone Encounter (Signed)
Noted will update

## 2020-03-28 ENCOUNTER — Telehealth: Payer: Medicare HMO | Admitting: Family Medicine

## 2020-03-29 ENCOUNTER — Other Ambulatory Visit: Payer: Self-pay | Admitting: Family Medicine

## 2020-03-29 MED FILL — MYCOPHENOLATE MOFETIL 500 MG TABLET: 30 days supply | Qty: 60 | Fill #0 | Status: AC

## 2020-04-03 DIAGNOSIS — J849 Interstitial pulmonary disease, unspecified: Secondary | ICD-10-CM | POA: Diagnosis not present

## 2020-04-03 DIAGNOSIS — G4733 Obstructive sleep apnea (adult) (pediatric): Secondary | ICD-10-CM | POA: Diagnosis not present

## 2020-04-03 DIAGNOSIS — R062 Wheezing: Secondary | ICD-10-CM | POA: Diagnosis not present

## 2020-04-04 DIAGNOSIS — G4733 Obstructive sleep apnea (adult) (pediatric): Secondary | ICD-10-CM | POA: Diagnosis not present

## 2020-04-05 ENCOUNTER — Encounter: Payer: Self-pay | Admitting: Psychiatry

## 2020-04-05 ENCOUNTER — Ambulatory Visit (INDEPENDENT_AMBULATORY_CARE_PROVIDER_SITE_OTHER): Payer: Medicare HMO | Admitting: Psychiatry

## 2020-04-05 ENCOUNTER — Other Ambulatory Visit: Payer: Self-pay

## 2020-04-05 DIAGNOSIS — F5101 Primary insomnia: Secondary | ICD-10-CM | POA: Diagnosis not present

## 2020-04-05 DIAGNOSIS — F332 Major depressive disorder, recurrent severe without psychotic features: Secondary | ICD-10-CM | POA: Diagnosis not present

## 2020-04-05 DIAGNOSIS — F411 Generalized anxiety disorder: Secondary | ICD-10-CM | POA: Diagnosis not present

## 2020-04-05 DIAGNOSIS — R69 Illness, unspecified: Secondary | ICD-10-CM | POA: Diagnosis not present

## 2020-04-05 MED ORDER — REXULTI 0.5 MG PO TABS: 0.5000 mg | ORAL_TABLET | Freq: Every day | ORAL | 0 refills | Status: DC

## 2020-04-05 MED ORDER — DULOXETINE HCL 20 MG PO CPEP: ORAL_CAPSULE | ORAL | 1 refills | Status: DC

## 2020-04-05 MED ORDER — BREXPIPRAZOLE 1 MG PO TABS: 1.0000 mg | ORAL_TABLET | Freq: Every day | ORAL | 0 refills | Status: DC
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
## 2020-04-05 NOTE — Patient Instructions (Signed)
Decrease Duloxetine (Cymbalta) to one 20 mg capsule in the morning for 2 weeks, then stop.   Start Rexulti 0.5 mg daily for one week, then increase to 1 mg tablet daily. Can be taken with or without food, morning or night.

## 2020-04-11 ENCOUNTER — Telehealth: Payer: Self-pay | Admitting: Psychiatry

## 2020-04-11 NOTE — Telephone Encounter (Signed)
Rtc to patient and she developed side effects of stomach issues with pain, not sleeping, and just not feeling well.  She misunderstood her instructions and apologized she didn't do as directed on AVS. She thought you all discussed adding Rexulti on to the Cymbalta. So she's currently taking Duloxetine 20 mg still 2 capsules daily, she didn't decrease. Then she added Rexulti 0.5 mg on 22 nd. Said she has 1 more dose until increasing to 1 mg. She felt really bad, informed her we would get things adjusted where they needed to be and reassured her she's not the only one this happens to.

## 2020-04-11 NOTE — Telephone Encounter (Signed)
Stacey Alvarez called with side effects from the medication change.  Severe pain in the stomach, not sleeping, having breathing problems (not sure if this is from her lung disease or if it is aggravated from the mediciations)  Please call with advice on what she should do.  Somethings not right.

## 2020-04-11 NOTE — Telephone Encounter (Signed)
Just spoke to her and she is going to decrease Duloxetine 20 mg daily. She will go ahead and stop Rexulti as well, she felt like that's when side effects started. Advised her to call back with an update once her side effects improved.

## 2020-04-15 DIAGNOSIS — G4733 Obstructive sleep apnea (adult) (pediatric): Secondary | ICD-10-CM | POA: Diagnosis not present

## 2020-04-17 ENCOUNTER — Encounter: Payer: Self-pay | Admitting: Psychiatry

## 2020-04-17 ENCOUNTER — Telehealth: Payer: Self-pay | Admitting: Psychiatry

## 2020-04-17 ENCOUNTER — Other Ambulatory Visit: Payer: Self-pay

## 2020-04-17 ENCOUNTER — Ambulatory Visit (INDEPENDENT_AMBULATORY_CARE_PROVIDER_SITE_OTHER): Payer: Medicare HMO | Admitting: Psychiatry

## 2020-04-17 VITALS — BP 182/113 | HR 94

## 2020-04-17 DIAGNOSIS — F332 Major depressive disorder, recurrent severe without psychotic features: Secondary | ICD-10-CM | POA: Diagnosis not present

## 2020-04-17 DIAGNOSIS — F5101 Primary insomnia: Secondary | ICD-10-CM

## 2020-04-17 DIAGNOSIS — F411 Generalized anxiety disorder: Secondary | ICD-10-CM | POA: Diagnosis not present

## 2020-04-17 DIAGNOSIS — G2571 Drug induced akathisia: Secondary | ICD-10-CM

## 2020-04-17 MED ORDER — PROPRANOLOL HCL 10 MG PO TABS: ORAL_TABLET | ORAL | 1 refills | Status: DC

## 2020-04-17 NOTE — Telephone Encounter (Signed)
TC  Pl LM with answering service about 8 AM CO feeling "off" from meds and wanting a call back.  Lynder Parents, MD, DFAPA

## 2020-04-17 NOTE — Patient Instructions (Signed)
Stop Rexulti and this should start to help relieve side effects.  Start Propranolol 10 mg 1-2 tablets to help with restlessness. Can take this every 6 hours as needed. Call office if this does not help with restlessness. Recommend taking it around bedtime to help with restlessness so you can sleep.

## 2020-04-17 NOTE — Telephone Encounter (Signed)
Pt called stating she has been in distress all weekend. She stated she has not and will not go the emergency room. She is requesting someone call her as soon as possible.

## 2020-04-17 NOTE — Progress Notes (Signed)
Stacey Alvarez [id number] [birth year] 64 y.o.  Subjective:   Patient ID:  Stacey Alvarez is a 64 y.o. (DOB 1955/09/23) female.  Chief Complaint:  Chief Complaint  Patient presents with  . Insomnia  . Anxiety  . Depression    HPI Stacey Alvarez presents to the office today for follow-up of mood, anxiety, and insomnia. Accompanied by her friend, Cyril Mourning. She reports that she has not been able to sleep over the last 5 days. She reports that she will lay down and think she is tired and then has to get up and move around. She reports severe restlessness and feeling as if she is about to come out of her skin. She has had elevated anxiety. She reports periods of "wailing" recently. She reports persistent sad mood. She reports that has had some irritability. Low energy and loe motivation. Impaired concentration. Appetite has been poor. Denies SI.  Reports that she stopped Cymbalta around 04/11/20.  Son's birthday is approaching and she has had stress with planning celebration life service.    Past Psychiatric Medication Trials: (Reports sensitivity to medication) Lamictal- reports that 150 mg felt that this was too high. Lithium- Ineffective Paxil- Took years ago in combination with other medications Lexapro-ineffective Zoloft- ineffective Prozac- Has tremor and unsteadiness on 80 mg. Has taken about 5 years. Unsure of benefit. Effexor XR- adverse reaction. Confusion. Cymbalta- Ineffective.  Remeron- Wt gain. Trintellix- GI side effects. Some partial improvement., Itching on palms Wellbutrin- rashduring clinical trials. Gabapentin- felt unsteady. Suppressed breathing. Lyrica-Suppressed breathing. Temazepam Xanax Lithium-Ineffective Abilify-Ineffective. Risperdal- wt gain Rexulti- Akathisia  Viibryd- Reports that Dr. Toy Care has told her that she was on it and she does not recall this.   GAD-7     Office Visit from 04/05/2020 in [place] Visit from 02/21/2020 in  [facility]  Total GAD-7 Score 15 12    PHQ2-9     Office Visit from 04/05/2020 in [place] Video Visit from 02/23/2020 in [place] Office Visit from 02/21/2020 in [place] Visit from 07/30/2019 in [place] Office Visit from 06/14/2019 in [place]  PHQ-2 Total Score 6 6 6  0 3  PHQ-9 Total Score 27 6 20  0 3       Review of Systems:  Review of Systems  Respiratory: Positive for cough and shortness of breath.   Gastrointestinal: Positive for abdominal distention.  Musculoskeletal: Negative for gait problem.  Neurological: Negative for tremors.  Psychiatric/Behavioral:       Please refer to HPI    Medications: I have reviewed the patient's current medications.  Current Outpatient Medications  Medication Sig Dispense Refill  . ALPRAZolam (XANAX) 1 MG tablet Take 1 tablet (1 mg total) by mouth 4 (four) times daily as needed for anxiety. 360 tablet 0  . amLODipine (NORVASC) 5 MG tablet TAKE 1/2 TABLET (2.5 MG) IN THE MORNING AND 1 WHOLE TABLET (5 MG) AT BEDTIME. (Patient taking differently: 1 WHOLE TABLET (5 MG) AT BEDTIME.) 135 tablet 3  . butalbital-aspirin-caffeine-codeine (FIORINAL WITH CODEINE) 50-325-40-30 MG capsule Take 1 capsule by mouth every 6 (six) hours as needed for pain. 21 capsule 0  . cetirizine (ZYRTEC) 10 MG tablet Take 10 mg by mouth daily.    . Cholecalciferol (VITAMIN D) 50 MCG (2000 UT) CAPS Take by mouth.    . cyanocobalamin (,VITAMIN B-12,) 1000 MCG/ML injection Inject 1 mL into the skin once a week for 4 weeks,  then once a month for 5 months. 1 mL 8  . fenofibrate 160 MG tablet TAKE ONE TABLET BY MOUTH DAILY 90 tablet 0  . fluticasone (FLONASE) 50 MCG/ACT nasal spray Place 1 spray into both nostrils daily.    Marland Kitchen lamoTRIgine (LAMICTAL) 100 MG tablet Take 1 tablet (100 mg total) by  mouth daily. 90 tablet 0  . lisinopril (ZESTRIL) 20 MG tablet Take 1 tablet (20 mg total) by mouth daily. 180 tablet 0  . mycophenolate (CELLCEPT) 500 MG tablet Take 500 mg by mouth 2 (two) times daily.     . OXYGEN Inhale 2 L into the lungs as needed (SOB).    . pantoprazole (PROTONIX) 40 MG tablet TAKE ONE TABLET BY MOUTH TWICE A DAY (Patient taking differently: Take 40 mg by mouth daily. ) 180 tablet 1  . pravastatin (PRAVACHOL) 40 MG tablet TAKE ONE TABLET BY MOUTH DAILY 90 tablet 0  . predniSONE (DELTASONE) 2.5 MG tablet Take 0.25 mg by mouth daily with breakfast.   11  . pregabalin (LYRICA) 75 MG capsule Take 1 capsule (75 mg total) by mouth 2 (two) times daily. (Patient not taking: Reported on 03/09/2020) 60 capsule 1  . promethazine (PHENERGAN) 25 MG tablet TAKE ONE TABLET BY MOUTH EVERY 8 HOURS AS NEEDED FOR FOR NAUSEA AND VOMITING 30 tablet 0  . propranolol (INDERAL) 10 MG tablet Take 1-2 tabs po BID prn anxiety 120 tablet 1  . SYRINGE-NEEDLE, DISP, 3 ML 25G X 1" 3 ML MISC Use to administer B12 injections 1 time a week for 4 weeks, then once a month for 5 months. 10 each 0  . temazepam (RESTORIL) 30 MG capsule Take 1 capsule (30 mg total) by mouth at bedtime. 90 capsule 0  . traMADol (ULTRAM) 50 MG tablet Take 50 mg by mouth every 8 (eight) hours as needed. (Patient not taking: Reported on 03/09/2020)    . Vitamin D, Ergocalciferol, (DRISDOL) 1.25 MG (50000 UNIT) CAPS capsule Take 1 capsule (50,000 Units total) by mouth every 7 (seven) days. 12 capsule 0   No current facility-administered medications for this visit.    Medication Side Effects: Sleep Problems and Other: Akathisia  Allergies:  Allergies  Allergen Reactions  . Trintellix [Vortioxetine]   . Macrobid [Nitrofurantoin Monohyd Macro] Diarrhea    Diarrhea, GI upset  . Sulfa Antibiotics Rash    Past Medical History:  Diagnosis Date  . Allergy   . Anxiety   . Arthritis    KNEES,BACK,HIPS  . Depression   .  Fibromyalgia   . GERD (gastroesophageal reflux disease)   . Hyperlipidemia   . Hypertension   . IBS (irritable bowel syndrome)   . Interstitial lung disease ([place])   . Pulmonary embolism ([place]) 09/2016   provoked- s/p rotator cuff repair  . Sleep apnea     Family History  Problem Relation Age of Onset  . Diabetes Mother   . Heart disease Mother   . Anxiety disorder Mother   . Depression Mother   . Kidney disease Mother   . Diabetes Father   . Heart disease Father   . Anxiety disorder Father   . Depression Father   . Diabetes Paternal Grandmother   . Cystic fibrosis Son   . Alcohol abuse Son   . Drug abuse Son   . Anxiety disorder Son   . Depression Son   . Breast cancer Maternal Aunt   . Breast cancer Maternal Aunt     Social History  Socioeconomic History  . Marital status: Widowed    Spouse name: Not on file  . Number of children: 2  . Years of education: 9  . Highest education level: High school graduate  Occupational History  . 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 Smoking status: Former Research scientist (life sciences)  . Smokeless tobacco: Never Used  . Tobacco comment: smoked 2 years in late teens about 8-10 cigs daily.   Vaping Use  . Vaping Use: Never used  Substance and Sexual Activity  . Alcohol use: No    Alcohol/week: 0.0 standard drinks  . Drug use: No  . Sexual activity: Not on file  Other Topics Concern  . Not on file  Social History Narrative   Lives alone, widowed. Has one dtr. in [place]. [place] and one son in [place]./fim   Social Determinants of Health   Financial Resource Strain:   . Difficulty of Paying Living Expenses:   Food Insecurity: No Food Insecurity  . Worried About Charity fundraiser in the Last Year: Never true  . Ran Out of Food in the Last Year: Never true  Transportation Needs: No Transportation Needs  . Lack of Transportation (Medical): No  . Lack of Transportation (Non-Medical): No  Physical Activity:   .  Days of Exercise per Week:   . Minutes of Exercise per Session:   Stress:   . Feeling of Stress :   Social Connections:   . Frequency of Communication with Friends and Family:   . Frequency of Social Gatherings with Friends and Family:   . Attends Religious Services:   . Active Member of Clubs or Organizations:   . Attends Archivist Meetings:   Marland Kitchen Marital Status:   Intimate Partner Violence:   . Fear of Current or Ex-Partner:   . Emotionally Abused:   Marland Kitchen Physically Abused:   . Sexually Abused:     Past Medical History, Surgical history, Social history, and Family history were reviewed and updated as appropriate.   Please see review of systems for further details on the patient's review from today.   Objective:   Physical Exam:  BP (!) 182/113   Pulse 94   Physical Exam Constitutional:      General: She is not in acute distress. Musculoskeletal:        General: No deformity.  Neurological:     Mental Status: She is alert and oriented to person, place, and time.     Coordination: Coordination normal.  Psychiatric:        Attention and Perception: Attention and perception normal. She does not perceive auditory or visual hallucinations.        Mood and Affect: Mood is anxious and depressed. Affect is not labile, blunt, angry or inappropriate.        Speech: Speech normal.        Behavior: Behavior normal.        Thought Content: Thought content normal. Thought content is not paranoid or delusional. Thought content does not include homicidal or suicidal ideation. Thought content does not include homicidal or suicidal plan.        Cognition and Memory: Cognition and memory normal.        Judgment: Judgment normal.     Comments: Insight intact     Lab Review:     Component Value Date/Time   NA 138 06/14/2019 1509   K 4.7 06/14/2019 1509   CL 100 06/14/2019 1509   CO2  28 06/14/2019 1509   GLUCOSE 98 06/14/2019 1509   BUN 16 06/14/2019 1509   CREATININE 0.75  06/14/2019 1509   CALCIUM 10.1 06/14/2019 1509   PROT 7.1 06/14/2019 1509   ALBUMIN 4.6 06/14/2019 1509   AST 10 06/14/2019 1509   ALT 9 06/14/2019 1509   ALKPHOS 65 06/14/2019 1509   BILITOT 0.5 06/14/2019 1509   GFRNONAA >60 09/13/2018 0255   GFRAA >60 09/13/2018 0255       Component Value Date/Time   WBC 8.5 06/14/2019 1509   RBC 4.67 06/14/2019 1509   HGB 13.1 06/14/2019 1509   HCT 39.0 06/14/2019 1509   PLT 329.0 06/14/2019 1509   MCV 83.6 06/14/2019 1509   MCH 27.4 09/15/2018 0808   MCHC 33.4 06/14/2019 1509   RDW 14.3 06/14/2019 1509   LYMPHSABS 1.6 06/14/2019 1509   MONOABS 0.4 06/14/2019 1509   EOSABS 0.2 06/14/2019 1509   BASOSABS 0.1 06/14/2019 1509    No results found for: POCLITH, LITHIUM   No results found for: PHENYTOIN, PHENOBARB, VALPROATE, CBMZ   .res Assessment: Plan:   Pt advised to stop Rexulti since this seems to be causing akathisia. Discussed that Rexulti has a long half-life and that side effects will gradually subside over the next 2 weeks.  Discussed potential benefits, risks, and side effects of Propranolol prn and discussed that this may be helpful for akathisia. Discussed that Propranolol could be taken up to q 6 hours for acute akathisia and recommended taking Propranolol prior to bedtime to minimize disrupted sleep. Pt advised to contact office if sleep and akathisia do not improve. Will consider starting TCA, such as Imipramine 25 mg x 1 week, then 50 mg x 1 week, then 75 mg once acute side effects improve. Recommend continuing psychotherapy with Beckey Downing, [facility]. Pt to f/u in 1-2 weeks or sooner if clinically indicated.  Patient advised to contact office with any questions, adverse effects, or acute worsening in signs and symptoms.  Navaya was seen today for insomnia, anxiety and depression.  Diagnoses and all orders for this visit:  Akathisia -     propranolol (INDERAL) 10 MG tablet; Take 1-2 tabs po BID prn anxiety  Generalized  anxiety disorder -     propranolol (INDERAL) 10 MG tablet; Take 1-2 tabs po BID prn anxiety  Severe episode of recurrent major depressive disorder, without psychotic features ([place])  Primary insomnia     Please see After Visit Summary for patient specific instructions.  Future Appointments  Date Time Provider [place]  04/26/2020 12:45 PM Thayer Headings, PMHNP [facility] None  06/08/2020  1:00 PM [facility] PHARMACIST [facility]  06/23/2020  1:20 PM Skeet Latch, MD [facility]    No orders of the defined types were placed in this encounter.   -------------------------------

## 2020-04-20 NOTE — Unmapped (Signed)
[facility] Refill Coordination Note    Specialty Medication(s) to be Shipped:   General Specialty: mycophenolate 500mg     Other medication(s) to be shipped: No additional medications requested for fill at this time     Amber Bush, DOB: November 01, 1955  Phone: [phone] (home)       All above HIPAA information was verified with patient.     Was a Nurse, learning disability used for this call? No    Completed refill call assessment today to schedule patient's medication shipment from the [facility] [phone]).       Specialty medication(s) and dose(s) confirmed: Regimen is correct and unchanged.   Changes to medications: Amber Bush reports no changes at this time.  Changes to insurance: No  Questions for the pharmacist: No    Confirmed patient received Welcome Packet with first shipment. The patient will receive a drug information handout for each medication shipped and additional FDA Medication Guides as required.       DISEASE/MEDICATION-SPECIFIC INFORMATION        N/A    SPECIALTY MEDICATION ADHERENCE     Medication Adherence    Patient reported X missed doses in the last month: 0  Specialty Medication: Mycophenolate 500mg   Patient is on additional specialty medications: No                Mycophenolate 500 mg: 14 days of medicine on hand         SHIPPING     Shipping address confirmed in Epic.     Delivery Scheduled: Yes, Expected medication delivery date: 04/28/20.     Medication will be delivered via UPS to the prescription address in Epic W[facility].    Nancy Nordmann [facility] Specialty Technician

## 2020-04-21 ENCOUNTER — Telehealth: Payer: Self-pay

## 2020-04-24 DIAGNOSIS — F321 Major depressive disorder, single episode, moderate: Principal | ICD-10-CM

## 2020-04-26 ENCOUNTER — Ambulatory Visit: Payer: Medicare HMO | Admitting: Psychiatry

## 2020-04-26 ENCOUNTER — Other Ambulatory Visit: Payer: Self-pay | Admitting: Family Medicine

## 2020-04-26 ENCOUNTER — Other Ambulatory Visit: Payer: Self-pay

## 2020-04-26 ENCOUNTER — Telehealth: Payer: Self-pay | Admitting: Psychiatry

## 2020-04-26 DIAGNOSIS — F5101 Primary insomnia: Secondary | ICD-10-CM

## 2020-04-26 MED ORDER — TEMAZEPAM 30 MG PO CAPS: 30.0000 mg | ORAL_CAPSULE | Freq: Every day | ORAL | 0 refills | Status: DC

## 2020-04-26 NOTE — Unmapped (Signed)
Writer attempted to call patient, left a message requesting patient to complete referral packet for Esketamine/TMS link  sent via my-chart once completed patient will be contacted to schedule a consult.

## 2020-04-26 NOTE — Telephone Encounter (Signed)
Pended for Janett Billow to send, has apt this Friday 08/13

## 2020-04-26 NOTE — Telephone Encounter (Signed)
Pt would like a refill on Temazepam 30mg . Please send to [facility].

## 2020-04-27 DIAGNOSIS — H524 Presbyopia: Secondary | ICD-10-CM | POA: Diagnosis not present

## 2020-04-27 DIAGNOSIS — H2513 Age-related nuclear cataract, bilateral: Secondary | ICD-10-CM | POA: Diagnosis not present

## 2020-04-27 DIAGNOSIS — H04123 Dry eye syndrome of bilateral lacrimal glands: Secondary | ICD-10-CM | POA: Diagnosis not present

## 2020-04-27 DIAGNOSIS — H02824 Cysts of left upper eyelid: Secondary | ICD-10-CM | POA: Diagnosis not present

## 2020-04-27 NOTE — Telephone Encounter (Signed)
Last OV 02/23/20 fiorinal last filled 03/27/20 #21 with 0

## 2020-04-28 ENCOUNTER — Other Ambulatory Visit: Payer: Self-pay

## 2020-04-28 ENCOUNTER — Encounter: Payer: Self-pay | Admitting: Psychiatry

## 2020-04-28 ENCOUNTER — Ambulatory Visit (INDEPENDENT_AMBULATORY_CARE_PROVIDER_SITE_OTHER): Payer: Medicare HMO | Admitting: Psychiatry

## 2020-04-28 DIAGNOSIS — F411 Generalized anxiety disorder: Secondary | ICD-10-CM | POA: Diagnosis not present

## 2020-04-28 DIAGNOSIS — R69 Illness, unspecified: Secondary | ICD-10-CM | POA: Diagnosis not present

## 2020-04-28 DIAGNOSIS — F332 Major depressive disorder, recurrent severe without psychotic features: Secondary | ICD-10-CM | POA: Diagnosis not present

## 2020-04-28 DIAGNOSIS — F5101 Primary insomnia: Secondary | ICD-10-CM

## 2020-04-28 MED ORDER — LATUDA 20 MG PO TABS: ORAL_TABLET | ORAL | 0 refills | Status: DC

## 2020-04-28 MED FILL — MYCOPHENOLATE MOFETIL 500 MG TABLET: ORAL | 30 days supply | Qty: 60 | Fill #1

## 2020-04-28 MED FILL — MYCOPHENOLATE MOFETIL 500 MG TABLET: 30 days supply | Qty: 60 | Fill #1 | Status: AC

## 2020-04-28 NOTE — Unmapped (Signed)
Amber Bush 's mycophenolate shipment will be delayed as a result of WAM downtime    I have reached out to the patient and communicated the delivery change. We will reschedule the medication for the delivery date that the patient agreed upon.  We have confirmed the delivery date as 05/01/20, via ups.

## 2020-04-28 NOTE — Patient Instructions (Signed)
Start Latuda 20 mg 1/2 tablet daily with evening meal for one week, then increase to 1 tablet daily with evening meal as tolerated.   Continue all other medications as prescribed.  Spravato Information:  Frequency is typically twice a week for weeks 1-4, then weekly weeks 5-8, then every 2 weeks.

## 2020-04-28 NOTE — Progress Notes (Signed)
Stacey Alvarez [id number] June 04, 1956 64 y.o.  Subjective:   Patient ID:  Stacey Alvarez is a 64 y.o. (DOB 12-28-1955) female.  Chief Complaint:  Chief Complaint  Patient presents with   Depression   Anxiety    HPI Stacey Alvarez presents to the office today for follow-up of depression, anxiety, and insomnia. She reports that she spoke with pulmonologist recently about depression and he mentioned Spravato. She is asking about Spravato. She reports that she is having severe irritability. She reports that she is shopping excessively online and often does not wear clothes that she bought for herself or buys clothes for her 76 yo daughter.   Mood is persistently sad. She reports that she has been crying frequently. She reports that she has been having severe anxiety. She reports poor motivation and is not taking care of her house or taking her trash out. She reports feeling tired. She reports that she was able to sleep 2 nights and otherwise sleep has been disturbed. Difficulty falling and staying asleep. Has not been able to sleep. She reports that she was not able to sleep last night and fell asleep around 8 pm and then awakened 1.5-2 hours later when the phone rang. She reports that she is becoming more reclusive. Anhedonia. Reports that thoughts are "jumpy." She reports that thoughts are repetitive. Reports that she had been hard on herself. Feelings of hopelessness. Vague SI without intent or plan- "I wouldn't do that to my daughter."  She reports improved restlessness. She reports that she occ feels like her skin is crawling.   Difficulty planning son's celebration of life service.   Past Psychiatric Medication Trials: (Reports sensitivity to medication) Lamictal- reports that 150 mg felt that this was too high. Lithium- Ineffective Paxil- Took years ago in combination with other medications Lexapro-ineffective Zoloft- ineffective Prozac- Has tremor and unsteadiness on 80 mg. Has taken  about 5 years. Unsure of benefit. Effexor XR- adverse reaction. Confusion. Cymbalta- Ineffective. Remeron- Wt gain. Trintellix- GI side effects. Some partial improvement., Itching on palms Wellbutrin- rashduring clinical trials. Gabapentin- felt unsteady. Suppressed breathing. Lyrica-Suppressed breathing. Temazepam Xanax Lithium-Ineffective Abilify-Ineffective. Risperdal- wt gain Rexulti- Akathisia         Viibryd- Reports that Dr. Toy Care has told her that she was on it and she does not recall this.  Propranolol- felt "revved up."   GAD-7     Office Visit from 04/05/2020 in [place] Visit from 02/21/2020 in [facility]  Total GAD-7 Score 15 12    PHQ2-9     Office Visit from 04/05/2020 in [place] Video Visit from 02/23/2020 in [place] Office Visit from 02/21/2020 in [place] [facility] Visit from 07/30/2019 in [place] Office Visit from 06/14/2019 in [place]  PHQ-2 Total Score 6 6 6  0 3  PHQ-9 Total Score 27 6 20  0 3       Review of Systems:  Review of Systems  Constitutional: Positive for fatigue.  Musculoskeletal: Negative for gait problem.  Neurological: Negative for tremors.  Psychiatric/Behavioral:       Please refer to HPI    Had cyst removed from her eyelid yesterday.   Medications: I have reviewed the patient's current medications.  Current Outpatient Medications  Medication Sig Dispense Refill   ALPRAZolam (XANAX) 1 MG tablet Take 1 tablet (1 mg total) by mouth 4 (four) times daily as needed for anxiety. 360 tablet 0  amLODipine (NORVASC) 5 MG tablet TAKE 1/2 TABLET (2.5 MG) IN THE MORNING AND 1 WHOLE TABLET (5 MG) AT BEDTIME. (Patient taking differently: 1 WHOLE TABLET (5 MG) AT BEDTIME.) 135 tablet 3   cetirizine (ZYRTEC) 10 MG tablet Take 10 mg by mouth  daily.     Cholecalciferol (VITAMIN D) 50 MCG (2000 UT) CAPS Take by mouth.     cyanocobalamin (,VITAMIN B-12,) 1000 MCG/ML injection Inject 1 mL into the skin once a week for 4 weeks, then once a month for 5 months. 1 mL 8   fenofibrate 160 MG tablet TAKE ONE TABLET BY MOUTH DAILY 90 tablet 0   fluticasone (FLONASE) 50 MCG/ACT nasal spray Place 1 spray into both nostrils daily.     lamoTRIgine (LAMICTAL) 100 MG tablet Take 1 tablet (100 mg total) by mouth daily. 90 tablet 0   lisinopril (ZESTRIL) 20 MG tablet Take 1 tablet (20 mg total) by mouth daily. 180 tablet 0   mycophenolate (CELLCEPT) 500 MG tablet Take 500 mg by mouth 2 (two) times daily.      pravastatin (PRAVACHOL) 40 MG tablet TAKE ONE TABLET BY MOUTH DAILY 90 tablet 0   predniSONE (DELTASONE) 2.5 MG tablet Take 0.25 mg by mouth daily with breakfast.   11   temazepam (RESTORIL) 30 MG capsule Take 1 capsule (30 mg total) by mouth at bedtime. 90 capsule 0   Vitamin D, Ergocalciferol, (DRISDOL) 1.25 MG (50000 UNIT) CAPS capsule Take 1 capsule (50,000 Units total) by mouth every 7 (seven) days. 12 capsule 0   butalbital-aspirin-caffeine-codeine (FIORINAL WITH CODEINE) 50-325-40-30 MG capsule TAKE ONE CAPSULE BY MOUTH EVERY 6 HOURS AS NEEDED FOR PAIN 21 capsule 0   lurasidone (LATUDA) 20 MG TABS tablet Take 1/2 tab po qd with evening meal for 7 days, then increase to 1 tab po qd as tolerated. 21 tablet 0   OXYGEN Inhale 2 L into the lungs as needed (SOB).     pantoprazole (PROTONIX) 40 MG tablet TAKE ONE TABLET BY MOUTH TWICE A DAY (Patient taking differently: Take 40 mg by mouth daily. ) 180 tablet 1   pregabalin (LYRICA) 75 MG capsule Take 1 capsule (75 mg total) by mouth 2 (two) times daily. (Patient not taking: Reported on 03/09/2020) 60 capsule 1   promethazine (PHENERGAN) 25 MG tablet TAKE ONE TABLET BY MOUTH EVERY 8 HOURS AS NEEDED FOR FOR NAUSEA AND VOMITING 30 tablet 0   SYRINGE-NEEDLE, DISP, 3 ML 25G X 1" 3 ML MISC  Use to administer B12 injections 1 time a week for 4 weeks, then once a month for 5 months. 10 each 0   traMADol (ULTRAM) 50 MG tablet Take 50 mg by mouth every 8 (eight) hours as needed. (Patient not taking: Reported on 03/09/2020)     No current facility-administered medications for this visit.    Medication Side Effects: None  Allergies:  Allergies  Allergen Reactions   Trintellix [Vortioxetine]    Macrobid [Nitrofurantoin Monohyd Macro] Diarrhea    Diarrhea, GI upset   Sulfa Antibiotics Rash    Past Medical History:  Diagnosis Date   Allergy    Anxiety    Arthritis    KNEES,BACK,HIPS   Depression    Fibromyalgia    GERD (gastroesophageal reflux disease)    Hyperlipidemia    Hypertension    IBS (irritable bowel syndrome)    Interstitial lung disease ([place])    Pulmonary embolism ([place]) 09/2016   provoked- s/p rotator cuff repair   Sleep apnea  Family History  Problem Relation Age of Onset   Diabetes Mother    Heart disease Mother    Anxiety disorder Mother    Depression Mother    Kidney disease Mother    Diabetes Father    Heart disease Father    Anxiety disorder Father    Depression Father    Diabetes Paternal Grandmother    Cystic fibrosis Son    Alcohol abuse Son    Drug abuse Son    Anxiety disorder Son    Depression Son    Breast cancer Maternal Aunt    Breast cancer Maternal Aunt     Social History   Socioeconomic History   Marital status: Widowed    Spouse name: Not on file   Number of children: 2   Years of education: 12   Highest education level: High school graduate  Occupational History   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   Vaping Use   Vaping Use: Never used  Substance and Sexual Activity   Alcohol use: No     Alcohol/week: 0.0 standard drinks   Drug use: No   Sexual activity: Not on file  Other Topics Concern   Not on file  Social History Narrative   Lives alone, widowed. Has one dtr. in [place]. [place] and one son in [place]./fim   Social Determinants of Health   Financial Resource Strain:    Difficulty of Paying Living Expenses:   Food Insecurity: No Food Insecurity   Worried About Charity fundraiser in the Last Year: Never true   Ran Out of Food in the Last Year: Never true  Transportation Needs: No Transportation Needs   Lack of Transportation (Medical): No   Lack of Transportation (Non-Medical): No  Physical Activity:    Days of Exercise per Week:    Minutes of Exercise per Session:   Stress:    Feeling of Stress :   Social Connections:    Frequency of Communication with Friends and Family:    Frequency of Social Gatherings with Friends and Family:    Attends Religious Services:    Active Member of Clubs or Organizations:    Attends Music therapist:    Marital Status:   Intimate Partner Violence:    Fear of Current or Ex-Partner:    Emotionally Abused:    Physically Abused:    Sexually Abused:     Past Medical History, Surgical history, Social history, and Family history were reviewed and updated as appropriate.   Please see review of systems for further details on the patient's review from today.   Objective:   Physical Exam:  There were no vitals taken for this visit.  Physical Exam Constitutional:      General: She is not in acute distress. Musculoskeletal:        General: No deformity.  Neurological:     Mental Status: She is alert and oriented to person, place, and time.     Coordination: Coordination normal.  Psychiatric:        Attention and Perception: Attention and perception normal. She does not perceive auditory or visual hallucinations.        Mood and Affect: Mood is anxious and depressed. Affect is tearful. Affect is  not labile, blunt, angry or inappropriate.  Speech: Speech normal.        Behavior: Behavior normal.        Thought Content: Thought content normal. Thought content is not paranoid or delusional. Thought content does not include homicidal or suicidal ideation. Thought content does not include homicidal or suicidal plan.        Cognition and Memory: Cognition and memory normal.        Judgment: Judgment normal.     Comments: Insight intact Irritable mood     Lab Review:     Component Value Date/Time   NA 138 06/14/2019 1509   K 4.7 06/14/2019 1509   CL 100 06/14/2019 1509   CO2 28 06/14/2019 1509   GLUCOSE 98 06/14/2019 1509   BUN 16 06/14/2019 1509   CREATININE 0.75 06/14/2019 1509   CALCIUM 10.1 06/14/2019 1509   PROT 7.1 06/14/2019 1509   ALBUMIN 4.6 06/14/2019 1509   AST 10 06/14/2019 1509   ALT 9 06/14/2019 1509   ALKPHOS 65 06/14/2019 1509   BILITOT 0.5 06/14/2019 1509   GFRNONAA >60 09/13/2018 0255   GFRAA >60 09/13/2018 0255       Component Value Date/Time   WBC 8.5 06/14/2019 1509   RBC 4.67 06/14/2019 1509   HGB 13.1 06/14/2019 1509   HCT 39.0 06/14/2019 1509   PLT 329.0 06/14/2019 1509   MCV 83.6 06/14/2019 1509   MCH 27.4 09/15/2018 0808   MCHC 33.4 06/14/2019 1509   RDW 14.3 06/14/2019 1509   LYMPHSABS 1.6 06/14/2019 1509   MONOABS 0.4 06/14/2019 1509   EOSABS 0.2 06/14/2019 1509   BASOSABS 0.1 06/14/2019 1509    No results found for: POCLITH, LITHIUM   No results found for: PHENYTOIN, PHENOBARB, VALPROATE, CBMZ   .res Assessment: Plan:   Case staffed with Dr. Clovis Pu.  Answered pt's questions regarding Spravato. Discussed potential benefits, risks, and side effects of Spravato and typical treatment course. Will coniser initiation of Spravato in the future.  Discussed potential benefits, risks, and side effects of Latuda. Recommend taking with evening meal to improve absorption and minimize risk of GI side effects.  Will start Latuda 10 mg  for po qd for one week, then increase to 20 mg po qd for mood s/s. Will stop Propranolol prn since pt reports that akathisia has resolved.  Continue Alprazolam prn anxiety.  Continue Lamictal 100 mg po qd for mood disturbance. Continue Temazepam 30 mg po QHS for insomnia.  Pt to f/u in 2 weeks or sooner if clinically indicated.  Patient advised to contact office with any questions, adverse effects, or acute worsening in signs and symptoms.  Stacey Alvarez was seen today for depression and anxiety.  Diagnoses and all orders for this visit:  Severe episode of recurrent major depressive disorder, without psychotic features ([place]) -     lurasidone (LATUDA) 20 MG TABS tablet; Take 1/2 tab po qd with evening meal for 7 days, then increase to 1 tab po qd as tolerated.  Generalized anxiety disorder  Primary insomnia     Please see After Visit Summary for patient specific instructions.  Future Appointments  Date Time Provider [place]  05/10/2020 10:30 AM Thayer Headings, PMHNP [facility] None  06/08/2020  1:00 PM [facility] PHARMACIST [facility]  06/23/2020  1:20 PM Skeet Latch, MD [facility]    No orders of the defined types were placed in this encounter.   -------------------------------

## 2020-05-02 DIAGNOSIS — R0602 Shortness of breath: Principal | ICD-10-CM

## 2020-05-03 NOTE — Unmapped (Signed)
Writer called patient regarding Esketamine consult, patient reports she will get Esketamine treatment with Dr. Jennelle Human @ [facility] in 2 weeks. Writer sent [facility]  staff message to notify providers patient will not be coming to [facility]

## 2020-05-04 DIAGNOSIS — R062 Wheezing: Secondary | ICD-10-CM | POA: Diagnosis not present

## 2020-05-04 DIAGNOSIS — J849 Interstitial pulmonary disease, unspecified: Secondary | ICD-10-CM | POA: Diagnosis not present

## 2020-05-04 DIAGNOSIS — G4733 Obstructive sleep apnea (adult) (pediatric): Secondary | ICD-10-CM | POA: Diagnosis not present

## 2020-05-05 DIAGNOSIS — G4733 Obstructive sleep apnea (adult) (pediatric): Secondary | ICD-10-CM | POA: Diagnosis not present

## 2020-05-10 ENCOUNTER — Encounter: Payer: Self-pay | Admitting: Psychiatry

## 2020-05-10 ENCOUNTER — Telehealth (INDEPENDENT_AMBULATORY_CARE_PROVIDER_SITE_OTHER): Payer: Medicare HMO | Admitting: Psychiatry

## 2020-05-10 DIAGNOSIS — R69 Illness, unspecified: Secondary | ICD-10-CM | POA: Diagnosis not present

## 2020-05-10 DIAGNOSIS — F332 Major depressive disorder, recurrent severe without psychotic features: Secondary | ICD-10-CM

## 2020-05-10 DIAGNOSIS — F411 Generalized anxiety disorder: Secondary | ICD-10-CM

## 2020-05-10 MED ORDER — LURASIDONE HCL 40 MG PO TABS: ORAL_TABLET | ORAL | 0 refills | Status: DC

## 2020-05-10 MED ORDER — LAMOTRIGINE 100 MG PO TABS: 100.0000 mg | ORAL_TABLET | Freq: Every day | ORAL | 0 refills | Status: DC

## 2020-05-10 MED ORDER — ALPRAZOLAM 1 MG PO TABS: 1.0000 mg | ORAL_TABLET | Freq: Four times a day (QID) | ORAL | 0 refills | Status: DC | PRN

## 2020-05-10 NOTE — Progress Notes (Signed)
Sebrina Kessner [id number] July 26, 1956 64 y.o.  Subjective:   Patient ID:  Markitta Ausburn is a 64 y.o. (DOB 03-11-1956) female.  Chief Complaint:  Chief Complaint  Patient presents with  . Follow-up    Depression, Anxiety, Irritability    HPI Aiana Presti presents to the office today for follow-up of depression, anxiety, and insomnia. She reports that her mood has significantly improved with Latuda. Reports that others have also remarked that pt seems better. She reports uncontrolled crying has improved. She reports that crying now is more grief related. She reports that her motivation has improved. She has been getting out, running errands, and going to lunch with friends. 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 Looking forward to things and making plans. Getting her house in order. She reports that her anxiety has improved. "I feel not as broken." She reports that rumination has decreased. Listening to a sermon before bedtime to calm her mind. No longer feeling restless. Appetite has been low. Snacking throughout the day. Has not noticed any weight gain. Concentration has improved. She reports that excessive spending has decreased. She reports that spending is now on items that are more useful and practical. Irritability has resolved. Denies SI.   She reports that her sleep has improved. Denies side effects.   Continues to see Beckey Downing, [facility] and reports that she was more engaged in last session.   Past Psychiatric Medication Trials: (Reports sensitivity to medication) Lamictal- reports that 150 mg felt that this was too high. Lithium- Ineffective Paxil- Took years ago in combination with other medications Lexapro-ineffective Zoloft- ineffective Prozac- Has tremor and unsteadiness on 80 mg. Has taken about 5 years. Unsure of benefit. Effexor XR- adverse reaction. Confusion. Cymbalta-  Ineffective. Remeron- Wt gain. Trintellix- GI side effects. Some partial improvement., Itching on palms Wellbutrin- rashduring clinical trials. Gabapentin- felt unsteady. Suppressed breathing. Lyrica-Suppressed breathing. Temazepam Xanax Lithium-Ineffective Abilify-Ineffective. Risperdal- wt gain Rexulti- Akathisia Viibryd- Reports that Dr. Toy Care has told her that she was on it and she does not recall this. Propranolol- felt "revved up."    GAD-7     Office Visit from 04/05/2020 in [place] Visit from 02/21/2020 in [facility]  Total GAD-7 Score 15 12    PHQ2-9     Office Visit from 04/05/2020 in [place] Video Visit from 02/23/2020 in [place] Office Visit from 02/21/2020 in [place] Visit from 07/30/2019 in [place] Office Visit from 06/14/2019 in [place]  PHQ-2 Total Score 6 6 6  0 3  PHQ-9 Total Score 27 6 20  0 3       Review of Systems:  Review of Systems  Musculoskeletal: Negative for gait problem.  Neurological: Negative for tremors.       Recent severe migraine  Psychiatric/Behavioral:       Please refer to HPI    Medications: I have reviewed the patient's current medications.  Current Outpatient Medications  Medication Sig Dispense Refill  . [START ON 05/17/2020] ALPRAZolam (XANAX) 1 MG tablet Take 1 tablet (1 mg total) by mouth 4 (four) times daily as needed for anxiety. 360 tablet 0  . amLODipine (NORVASC) 5 MG tablet TAKE 1/2 TABLET (2.5 MG) IN THE MORNING AND 1 WHOLE TABLET (5 MG) AT BEDTIME. (Patient taking differently: 1 WHOLE TABLET (5 MG) AT BEDTIME.) 135 tablet  3  . butalbital-aspirin-caffeine-codeine (FIORINAL WITH CODEINE) 50-325-40-30 MG capsule TAKE ONE CAPSULE BY MOUTH EVERY 6 HOURS AS NEEDED FOR PAIN 21 capsule 0  . cetirizine  (ZYRTEC) 10 MG tablet Take 10 mg by mouth daily.    . Cholecalciferol (VITAMIN D) 50 MCG (2000 UT) CAPS Take by mouth.    . cyanocobalamin (,VITAMIN B-12,) 1000 MCG/ML injection Inject 1 mL into the skin once a week for 4 weeks, then once a month for 5 months. 1 mL 8  . fenofibrate 160 MG tablet TAKE ONE TABLET BY MOUTH DAILY 90 tablet 0  . fluticasone (FLONASE) 50 MCG/ACT nasal spray Place 1 spray into both nostrils daily.    Marland Kitchen lamoTRIgine (LAMICTAL) 100 MG tablet Take 1 tablet (100 mg total) by mouth daily. 90 tablet 0  . lisinopril (ZESTRIL) 20 MG tablet Take 1 tablet (20 mg total) by mouth daily. 180 tablet 0  . lurasidone (LATUDA) 40 MG TABS tablet Take 1/2-1 tablet po qd with a meal 30 tablet 0  . mycophenolate (CELLCEPT) 500 MG tablet Take 500 mg by mouth 2 (two) times daily.     . OXYGEN Inhale 2 L into the lungs as needed (SOB).    . pantoprazole (PROTONIX) 40 MG tablet TAKE ONE TABLET BY MOUTH TWICE A DAY (Patient taking differently: Take 40 mg by mouth daily. ) 180 tablet 1  . pravastatin (PRAVACHOL) 40 MG tablet TAKE ONE TABLET BY MOUTH DAILY 90 tablet 0  . predniSONE (DELTASONE) 2.5 MG tablet Take 0.25 mg by mouth daily with breakfast.   11  . pregabalin (LYRICA) 75 MG capsule Take 1 capsule (75 mg total) by mouth 2 (two) times daily. (Patient not taking: Reported on 03/09/2020) 60 capsule 1  . promethazine (PHENERGAN) 25 MG tablet TAKE ONE TABLET BY MOUTH EVERY 8 HOURS AS NEEDED FOR FOR NAUSEA AND VOMITING 30 tablet 0  . SYRINGE-NEEDLE, DISP, 3 ML 25G X 1" 3 ML MISC Use to administer B12 injections 1 time a week for 4 weeks, then once a month for 5 months. 10 each 0  . temazepam (RESTORIL) 30 MG capsule Take 1 capsule (30 mg total) by mouth at bedtime. 90 capsule 0  . traMADol (ULTRAM) 50 MG tablet Take 50 mg by mouth every 8 (eight) hours as needed. (Patient not taking: Reported on 03/09/2020)    . Vitamin D, Ergocalciferol, (DRISDOL) 1.25 MG (50000 UNIT) CAPS capsule Take 1 capsule  (50,000 Units total) by mouth every 7 (seven) days. 12 capsule 0   No current facility-administered medications for this visit.    Medication Side Effects: None  Allergies:  Allergies  Allergen Reactions  . Trintellix [Vortioxetine]   . Macrobid [Nitrofurantoin Monohyd Macro] Diarrhea    Diarrhea, GI upset  . Sulfa Antibiotics Rash    Past Medical History:  Diagnosis Date  . Allergy   . Anxiety   . Arthritis    KNEES,BACK,HIPS  . Depression   . Fibromyalgia   . GERD (gastroesophageal reflux disease)   . Hyperlipidemia   . Hypertension   . IBS (irritable bowel syndrome)   . Interstitial lung disease ([place])   . Pulmonary embolism ([place]) 09/2016   provoked- s/p rotator cuff repair  . Sleep apnea     Family History  Problem Relation Age of Onset  . Diabetes Mother   . Heart disease Mother   . Anxiety disorder Mother   . Depression Mother   . Kidney disease Mother   . Diabetes Father   .  Heart disease Father   . Anxiety disorder Father   . Depression Father   . Diabetes Paternal Grandmother   . Cystic fibrosis Son   . Alcohol abuse Son   . Drug abuse Son   . Anxiety disorder Son   . Depression Son   . Breast cancer Maternal Aunt   . Breast cancer Maternal Aunt     Social History   Socioeconomic History  . Marital status: Widowed    Spouse name: Not on file  . Number of children: 2  . Years of education: 53  . Highest education level: High school graduate  Occupational History  . 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 Smoking status: Former Research scientist (life sciences)  . Smokeless tobacco: Never Used  . Tobacco comment: smoked 2 years in late teens about 8-10 cigs daily.   Vaping Use  . Vaping Use: Never used  Substance and Sexual Activity  . Alcohol use: No    Alcohol/week: 0.0 standard drinks  . Drug use: No  . Sexual activity: Not on file  Other Topics Concern  . Not on file  Social History Narrative   Lives alone, widowed. Has one  dtr. in [place]. [place] and one son in [place]./f[place]   Social Determinants of Health   Financial Resource Strain:   . Difficulty of Paying Living Expenses: Not on file  Food Insecurity: No Food Insecurity  . Worried About Charity fundraiser in the Last Year: Never true  . Ran Out of Food in the Last Year: Never true  Transportation Needs: No Transportation Needs  . Lack of Transportation (Medical): No  . Lack of Transportation (Non-Medical): No  Physical Activity:   . Days of Exercise per Week: Not on file  . Minutes of Exercise per Session: Not on file  Stress:   . Feeling of Stress : Not on file  Social Connections:   . Frequency of Communication with Friends and Family: Not on file  . Frequency of Social Gatherings with Friends and Family: Not on file  . Attends Religious Services: Not on file  . Active Member of Clubs or Organizations: Not on file  . Attends Archivist Meetings: Not on file  . Marital Status: Not on file  Intimate Partner Violence:   . Fear of Current or Ex-Partner: Not on file  . Emotionally Abused: Not on file  . Physically Abused: Not on file  . Sexually Abused: Not on file    Past Medical History, Surgical history, Social history, and Family history were reviewed and updated as appropriate.   Please see review of systems for further details on the patient's review from today.   Objective:   Physical Exam:  There were no vitals taken for this visit.  Physical Exam Neurological:     Mental Status: She is alert and oriented to person, place, and time.     Cranial Nerves: No dysarthria.  Psychiatric:        Attention and Perception: Attention and perception normal.        Mood and Affect: Mood normal.        Speech: Speech normal.        Behavior: Behavior is cooperative.        Thought Content: Thought content normal. Thought content is not paranoid or delusional. Thought content does not include homicidal or suicidal ideation. Thought content  does not include homicidal or suicidal plan.  Cognition and Memory: Cognition and memory normal.        Judgment: Judgment normal.     Comments: Insight intact     Lab Review:     Component Value Date/Time   NA 138 06/14/2019 1509   K 4.7 06/14/2019 1509   CL 100 06/14/2019 1509   CO2 28 06/14/2019 1509   GLUCOSE 98 06/14/2019 1509   BUN 16 06/14/2019 1509   CREATININE 0.75 06/14/2019 1509   CALCIUM 10.1 06/14/2019 1509   PROT 7.1 06/14/2019 1509   ALBUMIN 4.6 06/14/2019 1509   AST 10 06/14/2019 1509   ALT 9 06/14/2019 1509   ALKPHOS 65 06/14/2019 1509   BILITOT 0.5 06/14/2019 1509   GFRNONAA >60 09/13/2018 0255   GFRAA >60 09/13/2018 0255       Component Value Date/Time   WBC 8.5 06/14/2019 1509   RBC 4.67 06/14/2019 1509   HGB 13.1 06/14/2019 1509   HCT 39.0 06/14/2019 1509   PLT 329.0 06/14/2019 1509   MCV 83.6 06/14/2019 1509   MCH 27.4 09/15/2018 0808   MCHC 33.4 06/14/2019 1509   RDW 14.3 06/14/2019 1509   LYMPHSABS 1.6 06/14/2019 1509   MONOABS 0.4 06/14/2019 1509   EOSABS 0.2 06/14/2019 1509   BASOSABS 0.1 06/14/2019 1509    No results found for: POCLITH, LITHIUM   No results found for: PHENYTOIN, PHENOBARB, VALPROATE, CBMZ   .res Assessment: Plan:   Will continue Latuda 20 mg po qd for depression since pt has had a significant improvement in mood, anxiety, irritability, and excessive spending since starting Latuda. Discussed pt's concerns about possible cost of Latuda. 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 Discussed that samples of Latuda can be provided while exploring coverage options. Discussed that script would be sent to her pharmacy to determine insurance coverage and encouraged pt to contact office if she has difficulty with obtaining Latuda. Will  continue current plan of care.  Recommend continuing therapy with Beckey Downing, [facility]. Pt to f/u in 4 weeks or sooner if clinically indicated.  Patient advised to contact office with any questions, adverse effects, or acute worsening in signs and symptoms.  Gazella was seen today for follow-up.  Diagnoses and all orders for this visit:  Severe episode of recurrent major depressive disorder, without psychotic features ([place]) -     lurasidone (LATUDA) 40 MG TABS tablet; Take 1/2-1 tablet po qd with a meal -     lamoTRIgine (LAMICTAL) 100 MG tablet; Take 1 tablet (100 mg total) by mouth daily.  Generalized anxiety disorder -     ALPRAZolam (XANAX) 1 MG tablet; Take 1 tablet (1 mg total) by mouth 4 (four) times daily as needed for anxiety.     Please see After Visit Summary for patient specific instructions.  Future Appointments  Date Time Provider [place]  06/06/2020  1:30 PM Thayer Headings, PMHNP [facility] None  06/08/2020  1:00 PM [facility] PHARMACIST [facility]  06/23/2020  1:20 PM Skeet Latch, MD [facility]    No orders of the defined types were placed in this encounter.   -------------------------------

## 2020-05-11 ENCOUNTER — Other Ambulatory Visit: Payer: Self-pay | Admitting: Psychiatry

## 2020-05-11 DIAGNOSIS — M5441 Lumbago with sciatica, right side: Secondary | ICD-10-CM | POA: Diagnosis not present

## 2020-05-11 DIAGNOSIS — M25551 Pain in right hip: Secondary | ICD-10-CM | POA: Diagnosis not present

## 2020-05-11 DIAGNOSIS — M545 Low back pain: Secondary | ICD-10-CM | POA: Diagnosis not present

## 2020-05-11 DIAGNOSIS — F332 Major depressive disorder, recurrent severe without psychotic features: Secondary | ICD-10-CM

## 2020-05-12 ENCOUNTER — Telehealth: Payer: Self-pay | Admitting: Psychiatry

## 2020-05-12 NOTE — Telephone Encounter (Signed)
Will pull samples for her to pick up.

## 2020-05-12 NOTE — Telephone Encounter (Signed)
Pt left message reporting Anette Guarneri is too expensive requesting samples for 40 mg to cut in half until she can get on a program. Contact @ [phone]

## 2020-05-16 DIAGNOSIS — G4733 Obstructive sleep apnea (adult) (pediatric): Secondary | ICD-10-CM | POA: Diagnosis not present

## 2020-05-17 ENCOUNTER — Emergency Department (HOSPITAL_COMMUNITY)
Admission: EM | Admit: 2020-05-17 | Discharge: 2020-05-17 | Disposition: A | Discharge status: Home / Self Care | Payer: Medicare HMO | Attending: Emergency Medicine | Admitting: Emergency Medicine

## 2020-05-17 ENCOUNTER — Other Ambulatory Visit: Payer: Self-pay

## 2020-05-17 ENCOUNTER — Encounter (HOSPITAL_COMMUNITY): Payer: Self-pay | Admitting: Emergency Medicine

## 2020-05-17 DIAGNOSIS — Z5321 Procedure and treatment not carried out due to patient leaving prior to being seen by health care provider: Secondary | ICD-10-CM | POA: Insufficient documentation

## 2020-05-17 DIAGNOSIS — M79602 Pain in left arm: Secondary | ICD-10-CM | POA: Diagnosis present

## 2020-05-17 DIAGNOSIS — M79603 Pain in arm, unspecified: Secondary | ICD-10-CM | POA: Diagnosis not present

## 2020-05-17 DIAGNOSIS — R52 Pain, unspecified: Secondary | ICD-10-CM | POA: Diagnosis not present

## 2020-05-17 DIAGNOSIS — R0602 Shortness of breath: Secondary | ICD-10-CM | POA: Diagnosis not present

## 2020-05-17 DIAGNOSIS — I10 Essential (primary) hypertension: Secondary | ICD-10-CM | POA: Diagnosis not present

## 2020-05-17 LAB — BASIC METABOLIC PANEL
Anion gap: 13 (ref 5–15)
BUN: 17 mg/dL (ref 8–23)
CO2: 22 mmol/L (ref 22–32)
Calcium: 9.4 mg/dL (ref 8.9–10.3)
Chloride: 103 mmol/L (ref 98–111)
Creatinine, Ser: 0.75 mg/dL (ref 0.44–1.00)
GFR calc Af Amer: >60 mL/min (ref >60)
GFR calc non Af Amer: >60 mL/min (ref >60)
Glucose, Bld: 121 mg/dL — ABNORMAL HIGH (ref 70–99)
Potassium: 3.8 mmol/L (ref 3.5–5.1)
Sodium: 138 mmol/L (ref 135–145)

## 2020-05-17 LAB — CBC
HCT: 39.7 % (ref 36.0–46.0)
Hemoglobin: 12.5 g/dL (ref 12.0–15.0)
MCH: 26.1 pg (ref 26.0–34.0)
MCHC: 31.5 g/dL (ref 30.0–36.0)
MCV: 82.9 fL (ref 80.0–100.0)
Platelets: 353 10*3/uL (ref 150–400)
RBC: 4.79 MIL/uL (ref 3.87–5.11)
RDW: 13.4 % (ref 11.5–15.5)
WBC: 9.3 10*3/uL (ref 4.0–10.5)
nRBC: 0 % (ref 0.0–0.2)

## 2020-05-17 NOTE — ED Notes (Signed)
After taking pt vitals, pt stated that it is too many people here and she is leaving with her daughter who is on the way. Informed pt that we should get her EKG just to be on the safe side and have a doctor take a look. Pt stated her EKG was fine on the EMS and she was only out of breath because she didn't have her portable oxygen. Nurse notified

## 2020-05-17 NOTE — ED Triage Notes (Signed)
Pt states she is Covid vaccinated and denies any covid exposure.

## 2020-05-17 NOTE — ED Triage Notes (Signed)
[place] [facility] [place] for sudden c/o SOB that started today at 9:30 with left arm pain 8/10, SR on monitor, BP 158/78, HR 97 SPO2 99% 2L.

## 2020-05-17 NOTE — ED Notes (Signed)
Pt refuses to have EKG done on triage or any lab, states she is leaving and she has O2 at home that she can use if need it. Pt encouraged by this RN and by EMT to stay to be evaluated and she refuses any way.

## 2020-05-18 ENCOUNTER — Telehealth: Payer: Self-pay | Admitting: Psychiatry

## 2020-05-18 NOTE — Telephone Encounter (Signed)
Eulonda called to report that from the [place] she is experiencing severe night sweating, blood sugar is dropping to low and her blood pressure is dropping to low.  She went to the ER yesterday because she was in distress.  They did an EKG and gave her fluids because she was dehydrated.  Please call to advice what she should do in regards to this medication. [phone]

## 2020-05-18 NOTE — Telephone Encounter (Signed)
She reports that she has been developing night sweats, low BP, and low glucose levels. She reports that she had to rush to board a plane and then these s/s started. She reports that she did not have portable oxygen.   She is also planning Celebration of Life Service for son on 06/03/20 and this is very emotional for her.   Discussed increasing fluids, increasing relaxation, eating some throughout the day, and following up with PCP.

## 2020-05-19 NOTE — Unmapped (Signed)
[facility] Refill Coordination Note    Specialty Medication(s) to be Shipped:   General Specialty: mycophenolate 500mg     Other medication(s) to be shipped: No additional medications requested for fill at this time     Amber Bush, DOB: 1956-09-10  Phone: [phone] (home)       All above HIPAA information was verified with patient.     Was a Nurse, learning disability used for this call? No    Completed refill call assessment today to schedule patient's medication shipment from the [facility] [phone]).       Specialty medication(s) and dose(s) confirmed: Regimen is correct and unchanged.   Changes to medications: Amber Bush reports no changes at this time.  Changes to insurance: No  Questions for the pharmacist: No    Confirmed patient received Welcome Packet with first shipment. The patient will receive a drug information handout for each medication shipped and additional FDA Medication Guides as required.       DISEASE/MEDICATION-SPECIFIC INFORMATION        N/A    SPECIALTY MEDICATION ADHERENCE     Medication Adherence    Patient reported X missed doses in the last month: 0  Specialty Medication: Mycophenolate 500mg   Patient is on additional specialty medications: No                mycophenolate 500 mg: 10 days of medicine on hand         SHIPPING     Shipping address confirmed in Epic.     Delivery Scheduled: Yes, Expected medication delivery date: 05/24/20.     Medication will be delivered via UPS to the prescription address in Epic WAM.    Nancy Nordmann [facility] Specialty Technician

## 2020-05-23 MED FILL — MYCOPHENOLATE MOFETIL 500 MG TABLET: 30 days supply | Qty: 60 | Fill #2 | Status: AC

## 2020-05-23 MED FILL — MYCOPHENOLATE MOFETIL 500 MG TABLET: ORAL | 30 days supply | Qty: 60 | Fill #2

## 2020-05-24 ENCOUNTER — Ambulatory Visit: Payer: Medicare HMO | Admitting: Cardiovascular Disease

## 2020-05-26 ENCOUNTER — Encounter: Payer: Self-pay | Admitting: Family Medicine

## 2020-05-30 ENCOUNTER — Telehealth: Payer: Self-pay

## 2020-05-30 NOTE — Telephone Encounter (Signed)
Just an update: Patient reports her medication for Latuda 40 mg (takes 1/2 tablet to get higher quantity) is too expensive running at $1358.00. obviously patient can not pay for this. She did apply for patient assistance but they denied her due to her already having insurance.  Contacted her insurance with [employer] Medicare Part D, her medication is on the formulary and no PA required, submitted a tier reduction but they said medication is in the highest tier on her formulary and her plan does not allow her to receive those at a lower cost.

## 2020-06-02 NOTE — Telephone Encounter (Signed)
Spoke with her [employer], he said it's not over $1000.00 like she quoted but she has a periodic deductible which is $150.00 so goes towards that then cost of medication through her insurance is $381.76.   Total of $531.76

## 2020-06-04 DIAGNOSIS — G4733 Obstructive sleep apnea (adult) (pediatric): Secondary | ICD-10-CM | POA: Diagnosis not present

## 2020-06-04 DIAGNOSIS — R062 Wheezing: Secondary | ICD-10-CM | POA: Diagnosis not present

## 2020-06-04 DIAGNOSIS — J849 Interstitial pulmonary disease, unspecified: Secondary | ICD-10-CM | POA: Diagnosis not present

## 2020-06-05 ENCOUNTER — Telehealth: Payer: Medicare HMO

## 2020-06-05 DIAGNOSIS — G4733 Obstructive sleep apnea (adult) (pediatric): Secondary | ICD-10-CM | POA: Diagnosis not present

## 2020-06-06 ENCOUNTER — Encounter: Payer: Self-pay | Admitting: Psychiatry

## 2020-06-06 ENCOUNTER — Telehealth (INDEPENDENT_AMBULATORY_CARE_PROVIDER_SITE_OTHER): Payer: Medicare HMO | Admitting: Psychiatry

## 2020-06-06 DIAGNOSIS — F411 Generalized anxiety disorder: Secondary | ICD-10-CM

## 2020-06-06 DIAGNOSIS — F332 Major depressive disorder, recurrent severe without psychotic features: Secondary | ICD-10-CM

## 2020-06-06 DIAGNOSIS — R69 Illness, unspecified: Secondary | ICD-10-CM | POA: Diagnosis not present

## 2020-06-06 MED ORDER — METFORMIN HCL 500 MG PO TABS
ORAL_TABLET | ORAL | 1 refills | Status: DC
Start: 2020-06-06 — End: 2020-06-15

## 2020-06-06 MED ORDER — LATUDA 20 MG PO TABS: 20.0000 mg | ORAL_TABLET | Freq: Every day | ORAL | 0 refills | Status: DC

## 2020-06-06 NOTE — Progress Notes (Signed)
Stacey Alvarez [id number] 1955/12/12 64 y.o.  Virtual Visit via Telephone Note  I connected with pt on 06/06/20 at  1:30 PM EDT by telephone and verified that I am speaking with the correct person using two identifiers.   I discussed the limitations, risks, security and privacy concerns of performing an evaluation and management service by telephone and the availability of in person appointments. I also discussed with the patient that there may be a patient responsible charge related to this service. The patient expressed understanding and agreed to proceed.   I discussed the assessment and treatment plan with the patient. The patient was provided an opportunity to ask questions and all were answered. The patient agreed with the plan and demonstrated an understanding of the instructions.   The patient was advised to call back or seek an in-person evaluation if the symptoms worsen or if the condition fails to improve as anticipated.  I provided 30 minutes of non-face-to-face time during this encounter.  The patient was located at home.  The provider was located at [place].   Thayer Headings, PMHNP   Subjective:   Patient ID:  Stacey Alvarez is a 64 y.o. (DOB 1955/10/25) female.  Chief Complaint:  Chief Complaint  Patient presents with   Depression   Anxiety    HPI Stacey Alvarez presents for follow-up of depression and anxiety. She reports that Stacey Alvarez continues to be helpful for her mood. She reports that she has gained 15 lbs since starting Stacey Alvarez She reports that her clothes are tight. She reports that her appetite and food intake has not increased. She reports that weight increase has been steady. She reports that she has been more active in the last few weeks with preparing for son's celebration of life service.  She reports that this is a difficult time of the year for her.  She reports that her mood is persistently depressed. She reports that Alprazolam no longer seems  to be as effective. She reports that she typically takes Xanax 1 mg po q am and 2 mg po QHS. She also takes Temazepam at HS. She reports that she is having severe anxiety. She reports avoidance and has anxiety with being around people. She reports dread with being around others. She reports that she had a panic attack in response to respiratory issue where neighbor called 911. She has had recent frustration and irritability. She reports that her sleep has improved somewhat on Latuda. Energy and motivation have been very low. She reports that her concentration is poor. She reports that she will start to read something and will notice that her thoughts will wander to something else and then not comprehend what she has read.   She reports that she has been spending impulsively and gets at least one package daily.   She reports that she she has had vague suicidal thoughts without suicidal intent or plan.   She reports that Celebration of Life Service for her son went well on Saturday.   She reports that her friend with MS is in poor health.   Continues to see Stacey Alvarez, [facility] for therapy. She has completed 2 Grief Share programs since her husband's death and did not complete the second one. Plans to start a new Grief group tomorrow.  Past Psychiatric Medication Trials: (Reports sensitivity to medication) Lamictal- reports that 150 mg felt that this was too high. Lithium- Ineffective Paxil- Took years ago in combination with other medications Lexapro-ineffective Zoloft- ineffective Prozac- Has tremor and unsteadiness  on 80 mg. Has taken about 5 years. Unsure of benefit. Effexor XR- adverse reaction. Confusion. Cymbalta- Ineffective. Remeron- Wt gain. Trintellix- GI side effects. Some partial improvement., Itching on palms Wellbutrin- rashduring clinical trials. Gabapentin- felt unsteady. Suppressed breathing. Lyrica-Suppressed breathing. Temazepam Xanax Abilify-Ineffective. Risperdal-  wt gain Rexulti- Akathisia Latuda Viibryd- Reports that Dr. Toy Care has told her that she was on it and she does not recall this. Propranolol- felt "revved up."  Review of Systems:  Review of Systems  Cardiovascular:       She reports BP has been better controlled  Musculoskeletal: Negative for gait problem.  Neurological: Negative for tremors.  Psychiatric/Behavioral:       Please refer to HPI    Medications: I have reviewed the patient's current medications.  Current Outpatient Medications  Medication Sig Dispense Refill   ALPRAZolam (XANAX) 1 MG tablet Take 1 tablet (1 mg total) by mouth 4 (four) times daily as needed for anxiety. 360 tablet 0   amLODipine (NORVASC) 5 MG tablet TAKE 1/2 TABLET (2.5 MG) IN THE MORNING AND 1 WHOLE TABLET (5 MG) AT BEDTIME. (Patient taking differently: 1 WHOLE TABLET (5 MG) AT BEDTIME.) 135 tablet 3   butalbital-aspirin-caffeine-codeine (FIORINAL WITH CODEINE) 50-325-40-30 MG capsule TAKE ONE CAPSULE BY MOUTH EVERY 6 HOURS AS NEEDED FOR PAIN 21 capsule 0   cetirizine (ZYRTEC) 10 MG tablet Take 10 mg by mouth daily.     Cholecalciferol (VITAMIN D) 50 MCG (2000 UT) CAPS Take by mouth.     cyanocobalamin (,VITAMIN B-12,) 1000 MCG/ML injection Inject 1 mL into the skin once a week for 4 weeks, then once a month for 5 months. 1 mL 8   fenofibrate 160 MG tablet TAKE ONE TABLET BY MOUTH DAILY 90 tablet 0   fluticasone (FLONASE) 50 MCG/ACT nasal spray Place 1 spray into both nostrils daily.     lamoTRIgine (LAMICTAL) 100 MG tablet Take 1 tablet (100 mg total) by mouth daily. 90 tablet 0   lisinopril (ZESTRIL) 20 MG tablet Take 1 tablet (20 mg total) by mouth daily. 180 tablet 0   lurasidone (LATUDA) 20 MG TABS tablet Take 1 tablet (20 mg total) by mouth daily with supper. 30 tablet 0   metFORMIN (GLUCOPHAGE) 500 MG tablet Take 1 tablet daily with a meal for one week, then increase to 1 tablet twice daily with meals as tolerated 60 tablet 1    mycophenolate (CELLCEPT) 500 MG tablet Take 500 mg by mouth 2 (two) times daily.      OXYGEN Inhale 2 L into the lungs as needed (SOB).     pantoprazole (PROTONIX) 40 MG tablet TAKE ONE TABLET BY MOUTH TWICE A DAY (Patient taking differently: Take 40 mg by mouth daily. ) 180 tablet 1   pravastatin (PRAVACHOL) 40 MG tablet TAKE ONE TABLET BY MOUTH DAILY 90 tablet 0   predniSONE (DELTASONE) 2.5 MG tablet Take 0.25 mg by mouth daily with breakfast.   11   pregabalin (LYRICA) 75 MG capsule Take 1 capsule (75 mg total) by mouth 2 (two) times daily. (Patient not taking: Reported on 03/09/2020) 60 capsule 1   promethazine (PHENERGAN) 25 MG tablet TAKE ONE TABLET BY MOUTH EVERY 8 HOURS AS NEEDED FOR FOR NAUSEA AND VOMITING 30 tablet 0   SYRINGE-NEEDLE, DISP, 3 ML 25G X 1" 3 ML MISC Use to administer B12 injections 1 time a week for 4 weeks, then once a month for 5 months. 10 each 0   temazepam (RESTORIL) 30 MG capsule  Take 1 capsule (30 mg total) by mouth at bedtime. 90 capsule 0   traMADol (ULTRAM) 50 MG tablet Take 50 mg by mouth every 8 (eight) hours as needed. (Patient not taking: Reported on 03/09/2020)     Vitamin D, Ergocalciferol, (DRISDOL) 1.25 MG (50000 UNIT) CAPS capsule Take 1 capsule (50,000 Units total) by mouth every 7 (seven) days. 12 capsule 0   No current facility-administered medications for this visit.    Medication Side Effects: Other: Wt gain, possible increased urination  Allergies:  Allergies  Allergen Reactions   Trintellix [Vortioxetine]    Macrobid [Nitrofurantoin Monohyd Macro] Diarrhea    Diarrhea, GI upset   Sulfa Antibiotics Rash    Past Medical History:  Diagnosis Date   Allergy    Anxiety    Arthritis    KNEES,BACK,HIPS   Depression    Fibromyalgia    GERD (gastroesophageal reflux disease)    Hyperlipidemia    Hypertension    IBS (irritable bowel syndrome)    Interstitial lung disease (HCC)    Pulmonary embolism ([place]) 09/2016    provoked- s/p rotator cuff repair   Sleep apnea     Family History  Problem Relation Age of Onset   Diabetes Mother    Heart disease Mother    Anxiety disorder Mother    Depression Mother    Kidney disease Mother    Diabetes Father    Heart disease Father    Anxiety disorder Father    Depression Father    Diabetes Paternal Grandmother    Cystic fibrosis Son    Alcohol abuse Son    Drug abuse Son    Anxiety disorder Son    Depression Son    Breast cancer Maternal Aunt    Breast cancer Maternal Aunt     Social History   Socioeconomic History   Marital status: Widowed    Spouse name: Not on file   Number of children: 2   Years of education: 12   Highest education level: High school graduate  Occupational History   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   Vaping Use   Vaping Use: Never used  Substance and Sexual Activity   Alcohol use: No    Alcohol/week: 0.0 standard drinks   Drug use: No   Sexual activity: Not on file  Other Topics Concern   Not on file  Social History Narrative   Lives alone, widowed. Has one dtr. in [place]. [place] and one son in [place]./fim   Social Determinants of Health   Financial Resource Strain:    Difficulty of Paying Living Expenses: Not on file  Food Insecurity: No Food Insecurity   Worried About Charity fundraiser in the Last Year: Never true   Ran Out of Food in the Last Year: Never true  Transportation Needs: No Transportation Needs   Lack of Transportation (Medical): No   Lack of Transportation (Non-Medical): No  Physical Activity:    Days of Exercise per Week: Not on file   Minutes of Exercise per Session: Not on file  Stress:    Feeling of Stress : Not on file  Social Connections:    Frequency of Communication with  Friends and Family: Not on file   Frequency of Social Gatherings with Friends and Family: Not on file  Attends Religious Services: Not on file   Active Member of Clubs or Organizations: Not on file   Attends Archivist Meetings: Not on file   Marital Status: Not on file  Intimate Partner Violence:    Fear of Current or Ex-Partner: Not on file   Emotionally Abused: Not on file   Physically Abused: Not on file   Sexually Abused: Not on file    Past Medical History, Surgical history, Social history, and Family history were reviewed and updated as appropriate.   Please see review of systems for further details on the patient's review from today.   Objective:   Physical Exam:  BP 117/80    Pulse 95   Physical Exam Neurological:     Mental Status: She is alert and oriented to person, place, and time.     Cranial Nerves: No dysarthria.  Psychiatric:        Attention and Perception: Attention and perception normal.        Mood and Affect: Mood is anxious and depressed.        Speech: Speech normal.        Behavior: Behavior is cooperative.        Thought Content: Thought content normal. Thought content is not paranoid or delusional. Thought content does not include homicidal or suicidal ideation. Thought content does not include homicidal or suicidal plan.        Cognition and Memory: Cognition and memory normal.        Judgment: Judgment normal.     Comments: Insight intact     Lab Review:     Component Value Date/Time   NA 138 05/17/2020 1146   K 3.8 05/17/2020 1146   CL 103 05/17/2020 1146   CO2 22 05/17/2020 1146   GLUCOSE 121 (H) 05/17/2020 1146   BUN 17 05/17/2020 1146   CREATININE 0.75 05/17/2020 1146   CALCIUM 9.4 05/17/2020 1146   PROT 7.1 06/14/2019 1509   ALBUMIN 4.6 06/14/2019 1509   AST 10 06/14/2019 1509   ALT 9 06/14/2019 1509   ALKPHOS 65 06/14/2019 1509   BILITOT 0.5 06/14/2019 1509   GFRNONAA >60 05/17/2020 1146   GFRAA >60  05/17/2020 1146       Component Value Date/Time   WBC 9.3 05/17/2020 1146   RBC 4.79 05/17/2020 1146   HGB 12.5 05/17/2020 1146   HCT 39.7 05/17/2020 1146   PLT 353 05/17/2020 1146   MCV 82.9 05/17/2020 1146   MCH 26.1 05/17/2020 1146   MCHC 31.5 05/17/2020 1146   RDW 13.4 05/17/2020 1146   LYMPHSABS 1.6 06/14/2019 1509   MONOABS 0.4 06/14/2019 1509   EOSABS 0.2 06/14/2019 1509   BASOSABS 0.1 06/14/2019 1509    No results found for: POCLITH, LITHIUM   No results found for: PHENYTOIN, PHENOBARB, VALPROATE, CBMZ   .res Assessment: Plan:   Discussed possible treatment options with patient and concerns about cost of Latuda. Patient reports that Stacey Alvarez has seemed to be helpful for her mood. Discussed that she may be experiencing some worsening in mood and anxiety signs and symptoms with recent celebration of life service for her son and recent conflict with her daughter. Discussed her concerns about weight gain since starting Latuda and that metformin may be helpful for antipsychotic-induced weight gain and insulin resistance. Discussed potential benefits, risks, and side effects of metformin. Patient reports that she would like to continue Latuda 20 mg daily at this time and start trial of metformin. Will start  Metformin 500 mg daily with a meal for 1 week, then increase to 500 mg twice daily with meals. Patient reports that she would like to decrease alprazolam and discussed decreasing alprazolam gradually to minimize risk of benzodiazepine withdrawal. Patient reports that she would like to consider starting alprazolam taper after next visit when response to Latuda is determined. Recommend continuing psychotherapy with Stacey Alvarez, [facility]. Patient to follow-up in 3 weeks or sooner if clinically indicated. Patient advised to contact office with any questions, adverse effects, or acute worsening in signs and symptoms.  Stacey Alvarez was seen today for depression and anxiety.  Diagnoses and all  orders for this visit:  Severe episode of recurrent major depressive disorder, without psychotic features ([place]) -     lurasidone (LATUDA) 20 MG TABS tablet; Take 1 tablet (20 mg total) by mouth daily with supper.  Generalized anxiety disorder  Other orders -     metFORMIN (GLUCOPHAGE) 500 MG tablet; Take 1 tablet daily with a meal for one week, then increase to 1 tablet twice daily with meals as tolerated    Please see After Visit Summary for patient specific instructions.  Future Appointments  Date Time Provider [place]  06/16/2020  2:00 PM Midge Minium, MD [facility]  06/23/2020  1:20 PM Skeet Latch, MD [facility]  06/26/2020 10:30 AM [facility]    No orders of the defined types were placed in this encounter.     -------------------------------

## 2020-06-08 ENCOUNTER — Telehealth: Payer: Self-pay

## 2020-06-08 ENCOUNTER — Other Ambulatory Visit: Payer: Self-pay

## 2020-06-08 ENCOUNTER — Telehealth: Payer: Medicare HMO

## 2020-06-08 NOTE — Progress Notes (Signed)
°  Chronic Care Management   Outreach Note  06/08/2020 Name: Jyrah Blye MRN: [id number] DOB: [birth year]  Referred by: Midge Minium, MD Reason for referral: telephone visit [place] clinical pharmacist, Madelin Rear.    Attempted to reach patient for 1 pm telephone visit. If patient returns call, please provide number below to reschedule visit.   Madelin Rear, Pharm.D., [place] [phone]

## 2020-06-09 ENCOUNTER — Encounter: Payer: Self-pay | Admitting: Family Medicine

## 2020-06-14 NOTE — Unmapped (Signed)
[facility] Clinical Assessment & Refill Coordination Note    [name] reports she had her COVID booster vaccine yesterday, 06/13/20, and is doing well.    Amber Bush, DOB: 04/24/1956  Phone: [phone] (home)     All above HIPAA information was verified with patient.     Was a Nurse, learning disability used for this call? No    Specialty Medication(s):   Inflammatory Disorders: mycophenolate     Current Outpatient Medications   Medication Sig Dispense Refill   ??? ALPRAZolam (XANAX) 1 MG tablet Take 2 mg by mouth nightly. 1/2 tab in the morning.     ??? amLODIPine (NORVASC) 5 MG tablet Take 2.5 mg by mouth daily. 2.5 mg QHS     ??? biotin 10,000 mcg cap Take 10,000 mcg by mouth Two (2) times a day.     ??? cetirizine (ZYRTEC) 10 MG tablet Take 10 mg by mouth daily.     ??? fenofibrate (LOFIBRA) 160 MG tablet Take 160 mg by mouth daily.     ??? FLUoxetine (PROZAC) 40 MG capsule Take 40 mg by mouth daily. 40 mg once daily (Patient not taking: Reported on 01/17/2020)     ??? fluticasone (FLONASE) 50 mcg/actuation nasal spray 1 spray by Each Nare route daily.     ??? lamoTRIgine (LAMICTAL) 100 MG tablet Take 100 mg by mouth daily. Taking 1/2 tablet (50 mg) once daily     ??? lisinopril (PRINIVIL,ZESTRIL) 20 MG tablet Take 20 mg by mouth Two (2) times a day.      ??? mycophenolate (CELLCEPT) 500 mg tablet Take 1 tablet (500 mg total) by mouth Two (2) times a day. 60 tablet 11   ??? predniSONE (DELTASONE) 2.5 MG tablet Take 1 tablet (2.5 mg total) by mouth daily. 30 tablet 11   ??? promethazine (PHENERGAN) 25 MG tablet Take 25 mg by mouth every six (6) hours as needed for nausea.     ??? temAZEpam (RESTORIL) 30 mg capsule Take 30 mg by mouth nightly as needed for sleep.     ??? vortioxetine (TRINTELLIX) 20 mg tablet Take 20 mg by mouth daily.       No current facility-administered medications for this visit.        Changes to medications: [name] reports no changes at this time.    Allergies   Allergen Reactions   ??? Sulfa (Sulfonamide Antibiotics) Rash   ??? Sulfasalazine Rash       Changes to allergies: No    SPECIALTY MEDICATION ADHERENCE     Mycophenolate 500 mg: 10 days of medicine on hand       Medication Adherence    Patient reported X missed doses in the last month: 0  Specialty Medication: Mycophenolate 500mg   Informant: patient          Specialty medication(s) dose(s) confirmed: Regimen is correct and unchanged.     Are there any concerns with adherence? No    Adherence counseling provided? Not needed    CLINICAL MANAGEMENT AND INTERVENTION      Clinical Benefit Assessment:    Do you feel the medicine is effective or helping your condition? Yes    Clinical Benefit counseling provided? Not needed    Adverse Effects Assessment:    Are you experiencing any side effects? No    Are you experiencing difficulty administering your medicine? No    Quality of Life Assessment:    How many days over the past month did your ILD  keep you from your normal activities? For example, brushing your teeth or getting up in the morning. 0    Have you discussed this with your provider? Not needed    Therapy Appropriateness:    Is therapy appropriate? Yes, therapy is appropriate and should be continued    DISEASE/MEDICATION-SPECIFIC INFORMATION      N/A    PATIENT SPECIFIC NEEDS     - Does the patient have any physical, cognitive, or cultural barriers? No    - Is the patient high risk? Yes, patient is taking a REMS drug. Medication is dispensed in compliance with REMS program    - Does the patient require a Care Management Plan? No     - Does the patient require physician intervention or other additional services (i.e. nutrition, smoking cessation, social work)? No      SHIPPING     Specialty Medication(s) to be Shipped:   Inflammatory Disorders: mycophenolate    Other medication(s) to be shipped: No additional medications requested for fill at this time     Changes to insurance: No    Delivery Scheduled: Yes, Expected medication delivery date: 06/21/20.     Medication will be delivered via UPS to the confirmed prescription address in [facility].    The patient will receive a drug information handout for each medication shipped and additional FDA Medication Guides as required.  Verified that patient has previously received a Conservation officer, historic buildings.    All of the patient's questions and concerns have been addressed.    Camillo Flaming   [facility] Specialty Pharmacist

## 2020-06-15 ENCOUNTER — Encounter: Payer: Self-pay | Admitting: Psychiatry

## 2020-06-15 ENCOUNTER — Other Ambulatory Visit: Payer: Self-pay | Admitting: Family Medicine

## 2020-06-15 ENCOUNTER — Ambulatory Visit: Payer: Medicare HMO | Admitting: Psychiatry

## 2020-06-15 ENCOUNTER — Telehealth (INDEPENDENT_AMBULATORY_CARE_PROVIDER_SITE_OTHER): Payer: Medicare HMO | Admitting: Psychiatry

## 2020-06-15 DIAGNOSIS — F332 Major depressive disorder, recurrent severe without psychotic features: Secondary | ICD-10-CM | POA: Diagnosis not present

## 2020-06-15 DIAGNOSIS — R69 Illness, unspecified: Secondary | ICD-10-CM | POA: Diagnosis not present

## 2020-06-15 DIAGNOSIS — G4733 Obstructive sleep apnea (adult) (pediatric): Secondary | ICD-10-CM | POA: Diagnosis not present

## 2020-06-15 DIAGNOSIS — F411 Generalized anxiety disorder: Secondary | ICD-10-CM | POA: Diagnosis not present

## 2020-06-15 DIAGNOSIS — F5101 Primary insomnia: Secondary | ICD-10-CM

## 2020-06-15 MED ORDER — BUTALBITAL-ASA-CAFF-CODEINE 50-325-40-30 MG PO CAPS
1.0000 | ORAL_CAPSULE | Freq: Four times a day (QID) | ORAL | 0 refills | Status: DC | PRN
Start: 2020-06-15 — End: 2020-08-07

## 2020-06-15 MED ORDER — LATUDA 20 MG PO TABS: 30.0000 mg | ORAL_TABLET | Freq: Every day | ORAL | 0 refills | Status: DC

## 2020-06-15 NOTE — Progress Notes (Signed)
Stacey Alvarez [id number] [birth year] 64 y.o.  Virtual Visit via Telephone Note  I connected with pt on 06/15/20 at  3:15 PM EDT by telephone and verified that I am speaking with the correct person using two identifiers.   I discussed the limitations, risks, security and privacy concerns of performing an evaluation and management service by telephone and the availability of in person appointments. I also discussed with the patient that there may be a patient responsible charge related to this service. The patient expressed understanding and agreed to proceed.   I discussed the assessment and treatment plan with the patient. The patient was provided an opportunity to ask questions and all were answered. The patient agreed with the plan and demonstrated an understanding of the instructions.   The patient was advised to call back or seek an in-person evaluation if the symptoms worsen or if the condition fails to improve as anticipated.  I provided 30 minutes of non-face-to-face time during this encounter.  The patient was located at home.  The provider was located at [place].   Thayer Headings, PMHNP   Subjective:   Patient ID:  Stacey Alvarez is a 64 y.o. (DOB 1956/09/12) female.  Chief Complaint:  Chief Complaint  Patient presents with  . Depression  . Anxiety    HPI Stacey Alvarez presents for follow-up of anxiety, depression, and sleep disturbance. She reports that she has not been able to stop crying. She has been having frequent dreams. "The grief is just paralyzing. The fear of the future and the unknown..." She reports that uncontrolled crying started about a week ago. She reports that she is starting tasks and not completing them. She reports poor focus and concentration. She reports that she has been feeling "agitated." She reports feelings of hopelessness. She reports that sad mood has been more intense. "I just feel so alone." She reports some irritability and  frustration with herself. Difficulty falling asleep and "all these thoughts are flooding back." She reports vivid dreams about deceased loved ones. Vivid dreams are interrupting sleep. Going to bed around 11 pm and sometimes drifts off to sleep and other nights cannot fall asleep and will then try to read. She reports rumination and excessive guilt. She reports periods of severe anxiety and denies significant panic attacks. Energy and motivation have been low. Denies impulsive or risky behaviors. She has been isolating and putting off errands, such as going to the grocery store. Diminished interest in things. Appetite has been ok.  She reports that she is trying to stay hydrated. Denies any significant change in weight since last visit. Denies SI.   Past Psychiatric Medication Trials: (Reports sensitivity to medication) Lamictal- reports that 150 mg felt that this was too high. Lithium- Ineffective Paxil- Took years ago in combination with other medications Lexapro-ineffective Zoloft- ineffective Prozac- Has tremor and unsteadiness on 80 mg. Has taken about 5 years. Unsure of benefit. Effexor XR- adverse reaction. Confusion. Cymbalta- Ineffective. Remeron- Wt gain. Trintellix- GI side effects. Some partial improvement., Itching on palms Wellbutrin- rashduring clinical trials. Gabapentin- felt unsteady. Suppressed breathing. Lyrica-Suppressed breathing. Temazepam Xanax Abilify-Ineffective. Risperdal- wt gain Rexulti- Akathisia Latuda Viibryd- Reports that Dr. Toy Care has told her that she was on it and she does not recall this. Propranolol- felt "revved up."  Review of Systems:  Review of Systems  Musculoskeletal: Negative for gait problem.  Neurological: Negative for tremors.  Psychiatric/Behavioral:       Please refer to HPI    Reports that she had  a headache and body aches after COVID booster shot.  Medications: I have reviewed the patient's current  medications.  Current Outpatient Medications  Medication Sig Dispense Refill  . ALPRAZolam (XANAX) 1 MG tablet Take 1 tablet (1 mg total) by mouth 4 (four) times daily as needed for anxiety. 360 tablet 0  . amLODipine (NORVASC) 5 MG tablet TAKE 1/2 TABLET (2.5 MG) IN THE MORNING AND 1 WHOLE TABLET (5 MG) AT BEDTIME. (Patient taking differently: 1 WHOLE TABLET (5 MG) AT BEDTIME.) 135 tablet 3  . butalbital-aspirin-caffeine-codeine (FIORINAL WITH CODEINE) 50-325-40-30 MG capsule Take 1 capsule by mouth every 6 (six) hours as needed for pain. 21 capsule 0  . cetirizine (ZYRTEC) 10 MG tablet Take 10 mg by mouth daily.    . Cholecalciferol (VITAMIN D) 50 MCG (2000 UT) CAPS Take by mouth.    . cyanocobalamin (,VITAMIN B-12,) 1000 MCG/ML injection Inject 1 mL into the skin once a week for 4 weeks, then once a month for 5 months. 1 mL 8  . fenofibrate 160 MG tablet TAKE ONE TABLET BY MOUTH DAILY 90 tablet 0  . fluticasone (FLONASE) 50 MCG/ACT nasal spray Place 1 spray into both nostrils daily.    Marland Kitchen lamoTRIgine (LAMICTAL) 100 MG tablet Take 1 tablet (100 mg total) by mouth daily. 90 tablet 0  . lisinopril (ZESTRIL) 20 MG tablet Take 1 tablet (20 mg total) by mouth daily. 180 tablet 0  . lurasidone (LATUDA) 20 MG TABS tablet Take 1.5 tablets (30 mg total) by mouth daily with supper. 45 tablet 0  . mycophenolate (CELLCEPT) 500 MG tablet Take 500 mg by mouth 2 (two) times daily.     . OXYGEN Inhale 2 L into the lungs as needed (SOB).    . pantoprazole (PROTONIX) 40 MG tablet TAKE ONE TABLET BY MOUTH TWICE A DAY (Patient taking differently: Take 40 mg by mouth daily. ) 180 tablet 1  . pravastatin (PRAVACHOL) 40 MG tablet TAKE ONE TABLET BY MOUTH DAILY 90 tablet 0  . predniSONE (DELTASONE) 2.5 MG tablet Take 0.25 mg by mouth daily with breakfast.   11  . pregabalin (LYRICA) 75 MG capsule Take 1 capsule (75 mg total) by mouth 2 (two) times daily. (Patient not taking: Reported on 03/09/2020) 60 capsule 1  .  promethazine (PHENERGAN) 25 MG tablet TAKE ONE TABLET BY MOUTH EVERY 8 HOURS AS NEEDED FOR FOR NAUSEA AND VOMITING 30 tablet 0  . SYRINGE-NEEDLE, DISP, 3 ML 25G X 1" 3 ML MISC Use to administer B12 injections 1 time a week for 4 weeks, then once a month for 5 months. 10 each 0  . temazepam (RESTORIL) 30 MG capsule Take 1 capsule (30 mg total) by mouth at bedtime. 90 capsule 0  . traMADol (ULTRAM) 50 MG tablet Take 50 mg by mouth every 8 (eight) hours as needed. (Patient not taking: Reported on 03/09/2020)    . Vitamin D, Ergocalciferol, (DRISDOL) 1.25 MG (50000 UNIT) CAPS capsule Take 1 capsule (50,000 Units total) by mouth every 7 (seven) days. 12 capsule 0   No current facility-administered medications for this visit.    Medication Side Effects: Other: Diarrhea, loose stools  Allergies:  Allergies  Allergen Reactions  . Trintellix [Vortioxetine]   . Macrobid [Nitrofurantoin Monohyd Macro] Diarrhea    Diarrhea, GI upset  . Sulfa Antibiotics Rash    Past Medical History:  Diagnosis Date  . Allergy   . Anxiety   . Arthritis    KNEES,BACK,HIPS  . Depression   .  Fibromyalgia   . GERD (gastroesophageal reflux disease)   . Hyperlipidemia   . Hypertension   . IBS (irritable bowel syndrome)   . Interstitial lung disease ([place])   . Pulmonary embolism ([place]) 09/2016   provoked- s/p rotator cuff repair  . Sleep apnea     Family History  Problem Relation Age of Onset  . Diabetes Mother   . Heart disease Mother   . Anxiety disorder Mother   . Depression Mother   . Kidney disease Mother   . Diabetes Father   . Heart disease Father   . Anxiety disorder Father   . Depression Father   . Diabetes Paternal Grandmother   . Cystic fibrosis Son   . Alcohol abuse Son   . Drug abuse Son   . Anxiety disorder Son   . Depression Son   . Breast cancer Maternal Aunt   . Breast cancer Maternal Aunt     Social History   Socioeconomic History  . Marital status: Widowed    Spouse name:  Not on file  . Number of children: 2  . Years of education: 54  . Highest education level: High school graduate  Occupational History  . 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 Smoking status: Former Research scientist (life sciences)  . Smokeless tobacco: Never Used  . Tobacco comment: smoked 2 years in late teens about 8-10 cigs daily.   Vaping Use  . Vaping Use: Never used  Substance and Sexual Activity  . Alcohol use: No    Alcohol/week: 0.0 standard drinks  . Drug use: No  . Sexual activity: Not on file  Other Topics Concern  . Not on file  Social History Narrative   Lives alone, widowed. Has one dtr. in [place]. [place] and one son in [place]./f[place]   Social Determinants of Health   Financial Resource Strain:   . Difficulty of Paying Living Expenses: Not on file  Food Insecurity: No Food Insecurity  . Worried About Charity fundraiser in the Last Year: Never true  . Ran Out of Food in the Last Year: Never true  Transportation Needs: No Transportation Needs  . Lack of Transportation (Medical): No  . Lack of Transportation (Non-Medical): No  Physical Activity:   . Days of Exercise per Week: Not on file  . Minutes of Exercise per Session: Not on file  Stress:   . Feeling of Stress : Not on file  Social Connections:   . Frequency of Communication with Friends and Family: Not on file  . Frequency of Social Gatherings with Friends and Family: Not on file  . Attends Religious Services: Not on file  . Active Member of Clubs or Organizations: Not on file  . Attends Archivist Meetings: Not on file  . Marital Status: Not on file  Intimate Partner Violence:   . Fear of Current or Ex-Partner: Not on file  . Emotionally Abused: Not on file  . Physically Abused: Not on file  . Sexually Abused: Not on file    Past Medical History, Surgical history, Social history, and Family history were reviewed and updated as appropriate.   Please see review of systems for further  details on the patient's review from today.   Objective:   Physical Exam:  There were no vitals taken for this visit.  Physical Exam Neurological:     Mental Status: She is alert and oriented to person, place, and time.     Cranial Nerves:  No dysarthria.  Psychiatric:        Attention and Perception: Attention and perception normal.        Mood and Affect: Mood is anxious and depressed. Affect is tearful.        Speech: Speech normal.        Behavior: Behavior is cooperative.        Thought Content: Thought content normal. Thought content is not paranoid or delusional. Thought content does not include homicidal or suicidal ideation. Thought content does not include homicidal or suicidal plan.        Cognition and Memory: Cognition and memory normal.        Judgment: Judgment normal.     Comments: Insight intact     Lab Review:     Component Value Date/Time   NA 138 05/17/2020 1146   K 3.8 05/17/2020 1146   CL 103 05/17/2020 1146   CO2 22 05/17/2020 1146   GLUCOSE 121 (H) 05/17/2020 1146   BUN 17 05/17/2020 1146   CREATININE 0.75 05/17/2020 1146   CALCIUM 9.4 05/17/2020 1146   PROT 7.1 06/14/2019 1509   ALBUMIN 4.6 06/14/2019 1509   AST 10 06/14/2019 1509   ALT 9 06/14/2019 1509   ALKPHOS 65 06/14/2019 1509   BILITOT 0.5 06/14/2019 1509   GFRNONAA >60 05/17/2020 1146   GFRAA >60 05/17/2020 1146       Component Value Date/Time   WBC 9.3 05/17/2020 1146   RBC 4.79 05/17/2020 1146   HGB 12.5 05/17/2020 1146   HCT 39.7 05/17/2020 1146   PLT 353 05/17/2020 1146   MCV 82.9 05/17/2020 1146   MCH 26.1 05/17/2020 1146   MCHC 31.5 05/17/2020 1146   RDW 13.4 05/17/2020 1146   LYMPHSABS 1.6 06/14/2019 1509   MONOABS 0.4 06/14/2019 1509   EOSABS 0.2 06/14/2019 1509   BASOSABS 0.1 06/14/2019 1509    No results found for: POCLITH, LITHIUM   No results found for: PHENYTOIN, PHENOBARB, VALPROATE, CBMZ   .res Assessment: Plan:   Discussed potential benefits, risks, and  side effects of increasing Latuda. Pt agrees to increase in Latuda to 30 mg po qd.  Continue Xanax 1 mg four times daily as needed. Continue Lamictal 100 mg po qd for mood s/s.  Continue Temazepam 30 mg po QHS for insomnia.  Recommend continuing therapy with Beckey Downing, LPC. Pt to follow-up with this provider in 2 weeks or sooner if clinically indicated.  Patient advised to contact office with any questions, adverse effects, or acute worsening in signs and symptoms.   Stacey Alvarez was seen today for depression and anxiety.  Diagnoses and all orders for this visit:  Severe episode of recurrent major depressive disorder, without psychotic features ([place]) -     lurasidone (LATUDA) 20 MG TABS tablet; Take 1.5 tablets (30 mg total) by mouth daily with supper.  Generalized anxiety disorder  Primary insomnia    Please see After Visit Summary for patient specific instructions.  Future Appointments  Date Time Provider [place]  06/23/2020  1:20 PM Skeet Latch, MD [facility]  06/26/2020 10:30 AM [facility]  06/29/2020  9:30 AM Thayer Headings, PMHNP [facility] None    No orders of the defined types were placed in this encounter.     -------------------------------

## 2020-06-15 NOTE — Telephone Encounter (Signed)
Last OV 02/23/20 [facility] last filled 04/27/20 #21 with 0

## 2020-06-16 ENCOUNTER — Other Ambulatory Visit: Payer: Self-pay | Admitting: Family Medicine

## 2020-06-16 ENCOUNTER — Ambulatory Visit: Payer: Medicare HMO | Admitting: Family Medicine

## 2020-06-16 ENCOUNTER — Telehealth: Payer: Medicare HMO | Admitting: Psychiatry

## 2020-06-16 NOTE — Telephone Encounter (Signed)
Ok for refill? 

## 2020-06-19 ENCOUNTER — Telehealth: Payer: Self-pay | Admitting: Psychiatry

## 2020-06-19 ENCOUNTER — Encounter: Payer: Self-pay | Admitting: Family Medicine

## 2020-06-19 DIAGNOSIS — F332 Major depressive disorder, recurrent severe without psychotic features: Secondary | ICD-10-CM

## 2020-06-19 DIAGNOSIS — F411 Generalized anxiety disorder: Secondary | ICD-10-CM

## 2020-06-19 MED ORDER — PROMETHAZINE HCL 25 MG PO TABS: ORAL_TABLET | ORAL | 0 refills | Status: DC

## 2020-06-19 NOTE — Telephone Encounter (Signed)
Please review

## 2020-06-19 NOTE — Telephone Encounter (Signed)
Rtc to patient, she had the Covid booster last Tuesday, within 24 hours she started having chills, body aches, fatigue, sweating, vomiting. She also reports her injection site was red, hot touch and swollen the size of a golf ball. She did call [facility] where she received the injection, all they told her was to put cool compresses on her arm. They also said no one else reported vomiting. She was very worried about being dehydrated, she called her PCP and they advised her to call or go to urgent care. When she contacted them, they informed her it was a 4 hour wait. She decided she felt way to bad to go and sit there to wait. Today is the first time she is feeling better, she was able to eat some soup and keep it down. She will continue to drink gatorade as well. Sounds like symptoms are improving. Advised her to call me back Thursday and let us know how she was doing. She agreed to not start anything until she felt better.   She did have Trintellix sent to her earlier in the year but she has disposed of all of that thinking it caused those symptoms but now she feels it's related to the Covid injections.

## 2020-06-19 NOTE — Telephone Encounter (Signed)
Pt called and she got her booster shot and day after she got flu like symptoms. She is currently on Latuda but was denied patient assistance. She is wondering now if the she should go back to Trintellix now. She's not sure if the flu like symptoms are caused by the COVID booster or the Trintellix. [phone]

## 2020-06-20 MED FILL — MYCOPHENOLATE MOFETIL 500 MG TABLET: 30 days supply | Qty: 60 | Fill #3 | Status: AC

## 2020-06-20 MED FILL — MYCOPHENOLATE MOFETIL 500 MG TABLET: ORAL | 30 days supply | Qty: 60 | Fill #3

## 2020-06-21 DIAGNOSIS — R059 Cough, unspecified: Secondary | ICD-10-CM | POA: Diagnosis not present

## 2020-06-21 DIAGNOSIS — Z20822 Contact with and (suspected) exposure to covid-19: Secondary | ICD-10-CM | POA: Diagnosis not present

## 2020-06-21 DIAGNOSIS — J189 Pneumonia, unspecified organism: Secondary | ICD-10-CM | POA: Diagnosis not present

## 2020-06-21 NOTE — Telephone Encounter (Signed)
Patient called back today and said she did go to her PCP, he did confirm that several people have also had all her symptoms from the [facility]. She's improving and he gave her something for nausea, he also noticed some inflammation on her Left lung so he started her on Azithromycin. She's asking if it's okay to restart Trintellix 10 mg again, she spoke with Leda Gauze and her Patient assistance is still good and active. Let me know how to proceed.

## 2020-06-22 MED ORDER — VORTIOXETINE HBR 5 MG PO TABS: 5.0000 mg | ORAL_TABLET | Freq: Every day | ORAL | 0 refills | Status: DC

## 2020-06-22 NOTE — Telephone Encounter (Signed)
Called pt. She reports that she saw her PCP yesterday and PCP attributes her recent physical s/s to COVID booster shot. Tested negative for COVID and flu.  She reports that she had similar s/s after previous COVID vaccination and thinks that she may have attributed previous vaccine reaction to adverse effects to Trintellix. She reports that she is feeling better today after receiving fluids and that nausea has resolved. Reports that her mood was more depressed when she was having reaction and that mood is improving now. Has not increased Latuda to 30 mg qd due to GI illness.   She reports that wt gain has plateaued some and may have lost a few pounds with acute illness. Pt concerned about wt. Gain with Latuda since she gained 10-15 lbs immediately after starting Latuda.   She reports that spending has subsided.   Stop Latuda.  Start Trintellix 5 mg po qd with a meal.

## 2020-06-22 NOTE — Progress Notes (Signed)
Subjective:   Stacey Alvarez is a 65 y.o. female who presents for an Initial Medicare Annual Wellness Visit.  I connected with Deosha today by telephone and verified that I am speaking with the correct person using two identifiers. Location patient: home Location provider: work Persons participating in the virtual visit: patient, Marine scientist.    I discussed the limitations, risks, security and privacy concerns of performing an evaluation and management service by telephone and the availability of in person appointments. I also discussed with the patient that there may be a patient responsible charge related to this service. The patient expressed understanding and verbally consented to this telephonic visit.    Interactive audio and video telecommunications were attempted between this provider and patient, however failed, due to patient having technical difficulties OR patient did not have access to video capability.  We continued and completed visit with audio only.  Some vital signs may be absent or patient reported.   Time Spent with patient on telephone encounter: 25 minutes  Review of Systems     Cardiac Risk Factors include: advanced age (>68men, >72 women);hypertension;dyslipidemia;obesity (BMI >30kg/m2);sedentary lifestyle     Objective:    Today's Vitals   06/26/20 0946  Weight: 240 lb (108.9 kg)  Height: 5\' 4"  (1.626 m)  PainSc: 7    Body mass index is 41.2 kg/m.  Advanced Directives 06/26/2020 05/17/2020 09/14/2018 09/12/2018 06/10/2018 11/06/2017 05/09/2017  Does Patient Have a Medical Advance Directive? Yes No Yes Yes No No Yes  Type of Paramedic of [place];Living will - [place];Living will  Does patient want to make changes to medical advance directive? - - No - Patient declined No - Patient declined - - Yes (MAU/Ambulatory/Procedural Areas - Information given)  Copy of [place] in Chart? No - copy requested - - - - - -  Would patient like information on creating a medical advance directive? - No - Patient declined - - - No - Patient declined -  Some encounter information is confidential and restricted. Go to Review Flowsheets activity to see all data.    Current Medications (verified) Outpatient Encounter Medications as of 06/26/2020  Medication Sig  . ALPRAZolam (XANAX) 1 MG tablet Take 1 tablet (1 mg total) by mouth 4 (four) times daily as needed for anxiety. (Patient taking differently: Take 1 mg by mouth 4 (four) times daily as needed for anxiety. 1 tablet in the morning , 2 tablets at bedtime)  . amLODipine (NORVASC) 5 MG tablet TAKE 1/2 TABLET (2.5 MG) IN THE MORNING AND 1 WHOLE TABLET (5 MG) AT BEDTIME. (Patient taking differently: 5 mg. 1 WHOLE TABLET (5 MG) AT BEDTIME.)  . butalbital-aspirin-caffeine-codeine (FIORINAL WITH CODEINE) 50-325-40-30 MG capsule Take 1 capsule by mouth every 6 (six) hours as needed for pain.  . cetirizine (ZYRTEC) 10 MG tablet Take 10 mg by mouth daily.  . fenofibrate 160 MG tablet TAKE ONE TABLET BY MOUTH DAILY  . fluticasone (FLONASE) 50 MCG/ACT nasal spray Place 1 spray into both nostrils daily.  Marland Kitchen lamoTRIgine (LAMICTAL) 100 MG tablet Take 1 tablet (100 mg total) by mouth daily.  . mycophenolate (CELLCEPT) 500 MG tablet Take 500 mg by mouth 2 (two) times daily.   . OXYGEN Inhale 2 L into the lungs as needed (SOB).  . pantoprazole (PROTONIX) 40 MG tablet TAKE ONE TABLET BY MOUTH TWICE A DAY (Patient taking differently: Take 40 mg by mouth daily. )  .  pravastatin (PRAVACHOL) 40 MG tablet Take 1 tablet (40 mg total) by mouth daily.  . predniSONE (DELTASONE) 2.5 MG tablet Take 0.25 mg by mouth daily with breakfast.   . promethazine (PHENERGAN) 25 MG tablet TAKE ONE TABLET BY MOUTH EVERY 8 HOURS AS NEEDED FOR FOR NAUSEA AND VOMITING  . temazepam (RESTORIL) 30 MG capsule Take 1 capsule (30 mg total) by mouth at bedtime.  .  valsartan (DIOVAN) 80 MG tablet Take 1 tablet (80 mg total) by mouth daily.  Marland Kitchen vortioxetine HBr (TRINTELLIX) 5 MG TABS tablet Take 1 tablet (5 mg total) by mouth daily.  . Cholecalciferol (VITAMIN D) 50 MCG (2000 UT) CAPS Take by mouth. (Patient not taking: Reported on 06/23/2020)  . cyanocobalamin (,VITAMIN B-12,) 1000 MCG/ML injection Inject 1 mL into the skin once a week for 4 weeks, then once a month for 5 months.  . lurasidone (LATUDA) 20 MG TABS tablet Take 1.5 tablets (30 mg total) by mouth daily with supper.  . ondansetron (ZOFRAN-ODT) 4 MG disintegrating tablet Take 4 mg by mouth every 8 (eight) hours as needed. (Patient not taking: Reported on 06/26/2020)  . pregabalin (LYRICA) 75 MG capsule Take 1 capsule (75 mg total) by mouth 2 (two) times daily.  . SYRINGE-NEEDLE, DISP, 3 ML 25G X 1" 3 ML MISC Use to administer B12 injections 1 time a week for 4 weeks, then once a month for 5 months.  . traMADol (ULTRAM) 50 MG tablet Take 50 mg by mouth every 8 (eight) hours as needed. (Patient not taking: Reported on 03/09/2020)  . Vitamin D, Ergocalciferol, (DRISDOL) 1.25 MG (50000 UNIT) CAPS capsule Take 1 capsule (50,000 Units total) by mouth every 7 (seven) days.  . [DISCONTINUED] azithromycin (ZITHROMAX) 250 MG tablet Take 250 mg by mouth as directed.  . [DISCONTINUED] lisinopril (ZESTRIL) 20 MG tablet Take 1 tablet (20 mg total) by mouth daily.  . [DISCONTINUED] pravastatin (PRAVACHOL) 40 MG tablet TAKE ONE TABLET BY MOUTH DAILY   No facility-administered encounter medications on file as of 06/26/2020.    Allergies (verified) Lisinopril, Trintellix [vortioxetine], Macrobid [nitrofurantoin monohyd macro], and Sulfa antibiotics   History: Past Medical History:  Diagnosis Date  . Allergy   . Anxiety   . Arthritis    KNEES,BACK,HIPS  . Depression   . Fibromyalgia   . GERD (gastroesophageal reflux disease)   . Hyperlipidemia   . Hypertension   . IBS (irritable bowel syndrome)   .  Interstitial lung disease ([place])   . Pulmonary embolism ([place]) 09/2016   provoked- s/p rotator cuff repair  . Sleep apnea    Past Surgical History:  Procedure Laterality Date  . CHOLECYSTECTOMY    . COLONOSCOPY    . ORIF ANKLE FRACTURE Left 09/13/2018   Procedure: OPEN REDUCTION INTERNAL FIXATION (ORIF) ANKLE FRACTURE;  Surgeon: Erle Crocker, MD;  Location: [facility];  Service: Orthopedics;  Laterality: Left;  . PARTIAL HYSTERECTOMY  1999  . POLYPECTOMY     Family History  Problem Relation Age of Onset  . Diabetes Mother   . Heart disease Mother   . Anxiety disorder Mother   . Depression Mother   . Kidney disease Mother   . Diabetes Father   . Heart disease Father   . Anxiety disorder Father   . Depression Father   . Diabetes Paternal Grandmother   . Cystic fibrosis Son   . Alcohol abuse Son   . Drug abuse Son   . Anxiety disorder Son   . Depression  Son   . Breast cancer Maternal Aunt   . Breast cancer Maternal Aunt    Social History   Socioeconomic History  . Marital status: Widowed    Spouse name: Not on file  . Number of children: 2  . Years of education: 32  . Highest education level: High school graduate  Occupational History  . 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 Smoking status: Former Research scientist (life sciences)  . Smokeless tobacco: Never Used  . Tobacco comment: smoked 2 years in late teens about 8-10 cigs daily.   Vaping Use  . Vaping Use: Never used  Substance and Sexual Activity  . Alcohol use: No    Alcohol/week: 0.0 standard drinks  . Drug use: No  . Sexual activity: Not on file  Other Topics Concern  . Not on file  Social History Narrative   Lives alone, widowed. Has one dtr. in [place]. [place] and one son in [place]   Social Determinants of Health   Financial Resource Strain: Low Risk   . Difficulty of Paying Living Expenses: Not hard at all  Food Insecurity: No Food Insecurity  . Worried About Charity fundraiser in the  Last Year: Never true  . Ran Out of Food in the Last Year: Never true  Transportation Needs: No Transportation Needs  . Lack of Transportation (Medical): No  . Lack of Transportation (Non-Medical): No  Physical Activity: Insufficiently Active  . Days of Exercise per Week: 4 days  . Minutes of Exercise per Session: 20 min  Stress: No Stress Concern Present  . Feeling of Stress : Only a little  Social Connections: Socially Isolated  . Frequency of Communication with Friends and Family: More than three times a week  . Frequency of Social Gatherings with Friends and Family: Never  . Attends Religious Services: Never  . Active Member of Clubs or Organizations: No  . Attends Archivist Meetings: Never  . Marital Status: Widowed    Tobacco Counseling Counseling given: Not Answered Comment: smoked 2 years in late teens about 8-10 cigs daily.    Clinical Intake:  Pre-visit preparation completed: Yes  Pain : 0-10 Pain Score: 7  Pain Type: Chronic pain Pain Location: Hip Pain Onset: More than a month ago Pain Frequency: Constant Pain Relieving Factors: Ice  Pain Relieving Factors: Ice  Nutritional Status: BMI > 30  Obese Nutritional Risks: None Diabetes: No Did pt. bring in CBG monitor from home?:  (phone visit)  How often do you need to have someone help you when you read instructions, pamphlets, or other written materials from your doctor or pharmacy?: 1 - Never What is the last grade level you completed in school?: 12th grade  Diabetic?No  Interpreter Needed?: No  Information entered by :: Caroleen Hamman LPN   Activities of Daily Living In your present state of health, do you have any difficulty performing the following activities: 06/26/2020 02/23/2020  Hearing? N N  Vision? N N  Difficulty concentrating or making decisions? Y N  Comment occasional -  Walking or climbing stairs? Y N  Dressing or bathing? N N  Doing errands, shopping? N N  Preparing Food  and eating ? N -  Using the Toilet? N -  In the past six months, have you accidently leaked urine? Y -  Comment occasionally-wears a pad -  Do you have problems with loss of bowel control? N -  Managing your Medications? N -  Managing your  Finances? N -  Housekeeping or managing your Housekeeping? N -  Some recent data might be hidden    Patient Care Team: Midge Minium, MD as PCP - General (Family Medicine) Skeet Latch, MD as PCP - Cardiology (Cardiology) Chucky May, MD as Consulting Physician (Psychiatry) Aloha Gell, MD as Consulting Physician (Obstetrics and Gynecology) Michael Boston, MD as Consulting Physician (General Surgery) Pyrtle, Lajuan Lines, MD as Consulting Physician (Gastroenterology) Madelin Rear, [facility] as Pharmacist (Pharmacist)  Indicate any recent Medical Services you may have received from other than [facility] providers in the past year (date may be approximate).     Assessment:   This is a routine wellness examination for Stacey Alvarez.  Hearing/Vision screen  Hearing Screening   125Hz  250Hz  500Hz  1000Hz  2000Hz  3000Hz  4000Hz  6000Hz  8000Hz   Right ear:           Left ear:           Comments: No issues  Vision Screening Comments: Wears glasses Last eye exam-06/2019-Dr Digby  Dietary issues and exercise activities discussed: Current Exercise Habits: The patient does not participate in regular exercise at present;Home exercise routine, Type of exercise: walking, Time (Minutes): 20, Frequency (Times/Week): 4, Weekly Exercise (Minutes/Week): 80, Intensity: Mild, Exercise limited by: None identified;orthopedic condition(s)  Goals    . Patient Stated     Increase water intake & increase activity    . PharmD Care Plan     CARE PLAN ENTRY  Current Barriers:  . Chronic Disease Management support, education, and care coordination needs related to Hypertension, Hyperlipidemia, GERD, Depression, and Anxiety.    Hypertension . Pharmacist Clinical Goal(s): o Over  the next 90 days, patient will work with PharmD and providers to achieve BP goal <140/90.  . Current regimen:  o Lisinopril 20 mg daily . Interventions: o Continue current management . Patient self care activities - Over the next 90 days, patient will: o Check BP at least once daily 1-2 weeks, document, and provide at future appointments o Ensure daily salt intake < 2300 mg/day  Hyperlipidemia . Pharmacist Clinical Goal(s): o Over the next 90 days, patient will work with PharmD and providers to achieve LDL goal < 100. . Current regimen:  o Pravastatin 40 mg daily  o Fenofibrate 160 mg daily  . Interventions: o Continue current management . Patient self care activities - Over the next 180 days, patient will: o Continue current management  Anxiety / Insomnia  . Pharmacist Clinical Goal(s) o Over the next 90 days, patient will work with PharmD and providers to minimize any depression related symptoms  . Current regimen: . Alprazolam 1 mg 4 (four) times daily as needed for anxiety . Temazepam 30 mg daily at bedtime for insomnia  . Interventions: o Continue current management . Patient self care activities - Over the next 90 days, patient will: o Continue current management  Depression . Pharmacist Clinical Goal(s) o Over the next 90 days, patient will work with PharmD and providers to minimize any depression related symptoms  . Current regimen:  . Lamotrigine 100 mg daily  . Modafinil 200 mg tablet - half to one whole tablet daily (MDD and OSA) . Interventions: o Continue current management . Patient self care activities - Over the next 90 days, patient will: o Continue current management  GERD (acid reflux) . Pharmacist Clinical Goal(s) o Over the next 90 days, patient will work with PharmD and providers to minimize GERD symptoms . Current regimen:  o Pantoprazole 40 mg daily  .  Interventions: o Continue current management . Patient self care activities - Over the next  90 days, patient will: o Continue current management  Medication management . Pharmacist Clinical Goal(s): o Over the next 90 days, patient will work with PharmD and providers to maintain optimal medication adherence . [facility] . Interventions o Comprehensive medication review performed. o Continue current medication management strategy . Patient self care activities - Over the next 90 days, patient will: o Focus on medication adherence by continuing current medication management o Take medications as prescribed o Report any questions or concerns to PharmD and/or provider(s)  Initial goal documentation      Depression Screen PHQ 2/9 Scores 06/26/2020 02/23/2020 07/30/2019 06/14/2019 04/12/2019 08/26/2018 05/20/2018  PHQ - 2 Score 3 6 0 3 2 0 6  PHQ- 9 Score 9 6 0 3 3 0 21  Some encounter information is confidential and restricted. Go to Review Flowsheets activity to see all data.   Patient currently on meds for depression and anxiety, She is seeing a psychiatric nurse practitioner and a counselor.  Fall Risk Fall Risk  06/26/2020 02/23/2020 07/30/2019 06/14/2019 05/20/2018  Falls in the past year? 0 0 1 1 No  Number falls in past yr: 0 0 1 0 -  Injury with Fall? 0 0 1 1 -  Risk for fall due to : - - Impaired balance/gait;Impaired mobility;Medication side effect - -  Risk for fall due to: Comment - - - - -  Follow up Falls prevention discussed Falls evaluation completed Falls evaluation completed - -    Any stairs in or around the home? Yes  If so, are there any without handrails? No  Home free of loose throw rugs in walkways, pet beds, electrical cords, etc? Yes  Adequate lighting in your home to reduce risk of falls? Yes   ASSISTIVE DEVICES UTILIZED TO PREVENT FALLS:  Life alert? No  Use of a cane, walker or w/c? No  Grab bars in the bathroom? No  Shower chair or bench in shower? No  Elevated toilet seat or a handicapped toilet? No   TIMED UP AND  GO:  Was the test performed? No . Phone visit   Cognitive Function:No cognitive impairment noted        Immunizations Immunization History  Administered Date(s) Administered  . Fluad Quad(high Dose 65+) 06/14/2019  . Influenza,inj,Quad PF,6+ Mos 06/16/2013, 06/12/2016, 07/11/2017, 05/20/2018  . Influenza-Unspecified 09/16/2014, 11/19/2015  . PFIZER SARS-COV-2 Vaccination 10/08/2019, 11/22/2019, 06/13/2020  . Pneumococcal Conjugate-13 06/14/2019  . Tdap 10/28/2005, 11/24/2015    TDAP status: Up to date   Flu vaccine status: Due- Patient plans to get vaccine soon  Pneumococcal vaccine status: Up to date   Covid-19 vaccine status: Completed vaccines  Qualifies for Shingles Vaccine? Yes   Zostavax completed No   Shingrix Completed?: No.    Education has been provided regarding the importance of this vaccine. Patient has been advised to call insurance company to determine out of pocket expense if they have not yet received this vaccine. Advised may also receive vaccine at local pharmacy or Health Dept. Verbalized acceptance and understanding.  Screening Tests Health Maintenance  Topic Date Due  . DEXA SCAN  11/15/2018  . INFLUENZA VACCINE  04/16/2020  . COLONOSCOPY  04/18/2020  . PNA vac Low Risk Adult (2 of 2 - PPSV23) 06/13/2020  . MAMMOGRAM  06/21/2021  . TETANUS/TDAP  11/23/2025  . COVID-19 Vaccine  Completed  . Hepatitis C Screening  Completed  Health Maintenance  Health Maintenance Due  Topic Date Due  . DEXA SCAN  11/15/2018  . INFLUENZA VACCINE  04/16/2020  . COLONOSCOPY  04/18/2020  . PNA vac Low Risk Adult (2 of 2 - PPSV23) 06/13/2020    Colorectal Cancer Screening: Due- Patient declined at this time.  Mammogram Status: Due- Patient states she will call herself to schedule.  Bone Density Status: Declined at this time.  Lung Cancer Screening: (Low Dose CT Chest recommended if Age 29-80 years, 30 pack-year currently smoking OR have quit w/in  15years.) does not qualify.     Additional Screening:  Hepatitis C Screening:Completed 11/24/2015  Vision Screening: Recommended annual ophthalmology exams for early detection of glaucoma and other disorders of the eye. Is the patient up to date with their annual eye exam?  Yes  Who is the provider or what is the name of the office in which the patient attends annual eye exams? Dr. Bing Plume   Dental Screening: Recommended annual dental exams for proper oral hygiene  Community Resource Referral / Chronic Care Management: CRR required this visit?  No   CCM required this visit?  No      Plan:     I have personally reviewed and noted the following in the patient's chart:   . Medical and social history . Use of alcohol, tobacco or illicit drugs  . Current medications and supplements . Functional ability and status . Nutritional status . Physical activity . Advanced directives . List of other physicians . Hospitalizations, surgeries, and ER visits in previous 12 months . Vitals . Screenings to include cognitive, depression, and falls . Referrals and appointments  In addition, I have reviewed and discussed with patient certain preventive protocols, quality metrics, and best practice recommendations. A written personalized care plan for preventive services as well as general preventive health recommendations were provided to patient.    Due to this being a telephonic visit, the after visit summary with patients personalized plan was offered to patient via mail or my-chart.  Patient would like to access on my-chart.   Marta Antu, LPN   [id number]  Nurse Health Advisor  Nurse Notes: None

## 2020-06-22 NOTE — Addendum Note (Signed)
Addended by: Sharyl Nimrod on: 06/22/2020 12:58 PM   Modules accepted: Orders

## 2020-06-23 ENCOUNTER — Encounter: Payer: Self-pay | Admitting: Cardiovascular Disease

## 2020-06-23 ENCOUNTER — Telehealth (INDEPENDENT_AMBULATORY_CARE_PROVIDER_SITE_OTHER): Payer: Medicare HMO | Admitting: Cardiovascular Disease

## 2020-06-23 VITALS — BP 117/70 | HR 86 | Ht 64.0 in | Wt 240.0 lb

## 2020-06-23 DIAGNOSIS — J849 Interstitial pulmonary disease, unspecified: Secondary | ICD-10-CM | POA: Diagnosis not present

## 2020-06-23 DIAGNOSIS — G4733 Obstructive sleep apnea (adult) (pediatric): Secondary | ICD-10-CM

## 2020-06-23 DIAGNOSIS — I2510 Atherosclerotic heart disease of native coronary artery without angina pectoris: Secondary | ICD-10-CM

## 2020-06-23 DIAGNOSIS — Z5181 Encounter for therapeutic drug level monitoring: Secondary | ICD-10-CM | POA: Diagnosis not present

## 2020-06-23 DIAGNOSIS — I10 Essential (primary) hypertension: Secondary | ICD-10-CM | POA: Diagnosis not present

## 2020-06-23 MED ORDER — PRAVASTATIN SODIUM 40 MG PO TABS
40.0000 mg | ORAL_TABLET | Freq: Every day | ORAL | 3 refills | Status: DC
Start: 2020-06-23 — End: 2021-06-11

## 2020-06-23 MED ORDER — VALSARTAN 80 MG PO TABS: 80.0000 mg | ORAL_TABLET | Freq: Every day | ORAL | 3 refills | Status: DC

## 2020-06-23 NOTE — Patient Instructions (Addendum)
Medication Instructions:  STOP LISINOPRIL   START VALSARTAN 80 MG DAILY   *If you need a refill on your cardiac medications before your next appointment, please call your pharmacy*   Lab Work: BMET [place]   If you have labs (blood work) drawn today and your tests are completely normal, you will receive your results only by: Marland Kitchen MyChart Message (if you have MyChart) OR . A paper copy in the mail If you have any lab test that is abnormal or we need to change your treatment, we will call you to review the results.   Testing/Procedures: NONE   Follow-Up: At [facility], you and your health needs are our priority.  As part of our continuing mission to provide you with exceptional heart care, we have created designated Provider Care Teams.  These Care Teams include your primary Cardiologist (physician) and Advanced Practice Providers (APPs -  Physician Assistants and Nurse Practitioners) who all work together to provide you with the care you need, when you need it.  We recommend signing up for the patient portal called "MyChart".  Sign up information is provided on this After Visit Summary.  MyChart is used to connect with patients for Virtual Visits (Telemedicine).  Patients are able to view lab/test results, encounter notes, upcoming appointments, etc.  Non-urgent messages can be sent to your provider as well.   To learn more about what you can do with MyChart, go to [email or web address].    Your next appointment:    09/22/2020 AT 9:40 AM TELEPHONE VISIT   Other Instructions  MONITOR AND LOG YOUR BLOOD PRESSURES AT HOME, HAVE READINGS AT YOUR FOLLOW UP VISIT   IF YOUR BLOOD PRESSURE IS CONSISTENTLY ABOVE 130/80 CALL THE OFFICE

## 2020-06-23 NOTE — Progress Notes (Signed)
Virtual Visit via Telephone Note   This visit type was conducted due to national recommendations for restrictions regarding the COVID-19 Pandemic (e.g. social distancing) in an effort to limit this patient's exposure and mitigate transmission in our community.  Due to her co-morbid illnesses, this patient is at least at moderate risk for complications without adequate follow up.  This format is felt to be most appropriate for this patient at this time.  The patient did not have access to video technology/had technical difficulties with video requiring transitioning to audio format only (telephone).  All issues noted in this document were discussed and addressed.  No physical exam could be performed with this format.  Please refer to the patient's chart for her  consent to telehealth for [facility].   The patient was identified using 2 identifiers.  Date:  06/23/2020   ID:  Stacey Alvarez, DOB 03-12-1956, MRN [id number]  Patient Location: Home Provider Location: Office/Clinic  PCP:  Midge Minium, MD  Cardiologist:  Skeet Latch, MD  Electrophysiologist:  None   Evaluation Performed:  Follow-Up Visit  Chief Complaint:    History of Present Illness:    The patient does not have symptoms concerning for COVID-19 infection (fever, chills, cough, or new shortness of breath).   Date:  06/23/2020   ID:  Stacey Alvarez, DOB February 23, 1956, MRN [id number]  PCP:  Midge Minium, MD  Cardiologist:   Skeet Latch, MD   No chief complaint on file.    History of Present Illness: Darnetta Kesselman is a 64 y.o. female with hypertension, hyperlipidemia, hypertriglyceridemia  ILD, prior DVT/PE, morbid obesity, and OSA here for follow-up.  She was initially seen 07/2017 for the evaluation of palpitations.  She had one episode of palpitations that occurred during pulmonary rehab where her vital signs were found to be within normal limits.  She feels like her heart is pounding very hard when  exercising and even after she quits.  She had a 1-lead EKG while having symptoms  that reportedly showed normal sinus rhythm.  She notes that her oxygen saturations and heart rate are always within normal limits but she feels poorly.  She was referred for [facility] 07/2017 that revealed LVEF 67% and no ischemia.  Stacey Alvarez had a pulmonary embolism 09/2016.  Echo at that time revealed LVEF 65-70% with grade 1 diastolic dysfunction and mild aortic regurgitation.  [name] graduated from the pulmonary rehab program.  She has not been getting much formal exercise since finishing that.  She has stable shortness of breath.  She does try to do some walking around her neighborhood but is not consistent with this.  She has no chest pain or pressure.  She denies lower extremity edema, orthopnea, or PND.  She is frustrated because she continues to gain weight on prednisone.  She has been told that she will have to be on prednisone and CellCept for the rest of her life.  She also continues to struggle with the loss of her husband of 42 years.  She has a lot of family support and is participating in counseling and grief share program.  She is also stressed by her son who has cystic fibrosis and a drug addict.  She notes that her blood pressure has been running high when she checks it at her doctor's offices.  At her last appointment [name]'s blood pressure was running a little elevated so amlodipine was started.  She followed up with Kerin Ransom, PA-C on 06/2019  and was doing well.  She had her third COVID-19 vaccine last week.  She had local site swelling to the size of a golf ball.  She had very bad flu-like symptoms.  She tested negative for COVID-19 and influenza.  She struggles to get exercise, though her pulmonologist noted that her ILD would improve with weight loss.  She has noted a dry cough and wonders if it is related to lisinopril.    Past Medical History:  Diagnosis Date  . Allergy   . Anxiety    . Arthritis    KNEES,BACK,HIPS  . Depression   . Fibromyalgia   . GERD (gastroesophageal reflux disease)   . Hyperlipidemia   . Hypertension   . IBS (irritable bowel syndrome)   . Interstitial lung disease ([place])   . Pulmonary embolism ([place]) 09/2016   provoked- s/p rotator cuff repair  . Sleep apnea     Past Surgical History:  Procedure Laterality Date  . CHOLECYSTECTOMY    . COLONOSCOPY    . ORIF ANKLE FRACTURE Left 09/13/2018   Procedure: OPEN REDUCTION INTERNAL FIXATION (ORIF) ANKLE FRACTURE;  Surgeon: Erle Crocker, MD;  Location: [place];  Service: Orthopedics;  Laterality: Left;  . PARTIAL HYSTERECTOMY  1999  . POLYPECTOMY       Current Outpatient Medications  Medication Sig Dispense Refill  . ALPRAZolam (XANAX) 1 MG tablet Take 1 tablet (1 mg total) by mouth 4 (four) times daily as needed for anxiety. (Patient taking differently: Take 1 mg by mouth 4 (four) times daily as needed for anxiety. 1 tablet in the morning , 2 tablets at bedtime) 360 tablet 0  . amLODipine (NORVASC) 5 MG tablet TAKE 1/2 TABLET (2.5 MG) IN THE MORNING AND 1 WHOLE TABLET (5 MG) AT BEDTIME. (Patient taking differently: 5 mg. 1 WHOLE TABLET (5 MG) AT BEDTIME.) 135 tablet 3  . azithromycin (ZITHROMAX) 250 MG tablet Take 250 mg by mouth as directed.    . butalbital-aspirin-caffeine-codeine (FIORINAL WITH CODEINE) 50-325-40-30 MG capsule Take 1 capsule by mouth every 6 (six) hours as needed for pain. 21 capsule 0  . cetirizine (ZYRTEC) 10 MG tablet Take 10 mg by mouth daily.    . cyanocobalamin (,VITAMIN B-12,) 1000 MCG/ML injection Inject 1 mL into the skin once a week for 4 weeks, then once a month for 5 months. 1 mL 8  . fenofibrate 160 MG tablet TAKE ONE TABLET BY MOUTH DAILY 90 tablet 0  . fluticasone (FLONASE) 50 MCG/ACT nasal spray Place 1 spray into both nostrils daily.    Marland Kitchen lamoTRIgine (LAMICTAL) 100 MG tablet Take 1 tablet (100 mg total) by mouth daily. 90 tablet 0  . lurasidone (LATUDA)  20 MG TABS tablet Take 1.5 tablets (30 mg total) by mouth daily with supper. 45 tablet 0  . mycophenolate (CELLCEPT) 500 MG tablet Take 500 mg by mouth 2 (two) times daily.     . ondansetron (ZOFRAN-ODT) 4 MG disintegrating tablet Take 4 mg by mouth every 8 (eight) hours as needed.    . OXYGEN Inhale 2 L into the lungs as needed (SOB).    . pantoprazole (PROTONIX) 40 MG tablet TAKE ONE TABLET BY MOUTH TWICE A DAY (Patient taking differently: Take 40 mg by mouth daily. ) 180 tablet 1  . pravastatin (PRAVACHOL) 40 MG tablet Take 1 tablet (40 mg total) by mouth daily. 90 tablet 3  . predniSONE (DELTASONE) 2.5 MG tablet Take 0.25 mg by mouth daily with breakfast.   11  .  pregabalin (LYRICA) 75 MG capsule Take 1 capsule (75 mg total) by mouth 2 (two) times daily. 60 capsule 1  . promethazine (PHENERGAN) 25 MG tablet TAKE ONE TABLET BY MOUTH EVERY 8 HOURS AS NEEDED FOR FOR NAUSEA AND VOMITING 30 tablet 0  . SYRINGE-NEEDLE, DISP, 3 ML 25G X 1" 3 ML MISC Use to administer B12 injections 1 time a week for 4 weeks, then once a month for 5 months. 10 each 0  . temazepam (RESTORIL) 30 MG capsule Take 1 capsule (30 mg total) by mouth at bedtime. 90 capsule 0  . Vitamin D, Ergocalciferol, (DRISDOL) 1.25 MG (50000 UNIT) CAPS capsule Take 1 capsule (50,000 Units total) by mouth every 7 (seven) days. 12 capsule 0  . vortioxetine HBr (TRINTELLIX) 5 MG TABS tablet Take 1 tablet (5 mg total) by mouth daily. 30 tablet 0  . Cholecalciferol (VITAMIN D) 50 MCG (2000 UT) CAPS Take by mouth. (Patient not taking: Reported on 06/23/2020)    . traMADol (ULTRAM) 50 MG tablet Take 50 mg by mouth every 8 (eight) hours as needed. (Patient not taking: Reported on 03/09/2020)    . valsartan (DIOVAN) 80 MG tablet Take 1 tablet (80 mg total) by mouth daily. 90 tablet 3   No current facility-administered medications for this visit.    Allergies:   Lisinopril, Trintellix [vortioxetine], Macrobid [nitrofurantoin monohyd macro], and Sulfa  antibiotics    Social History:  The patient  reports that she has quit smoking. She has never used smokeless tobacco. She reports that she does not drink alcohol and does not use drugs.   Family History:  The patient's family history includes Alcohol abuse in her son; Anxiety disorder in her father, mother, and son; Breast cancer in her maternal aunt and maternal aunt; Cystic fibrosis in her son; Depression in her father, mother, and son; Diabetes in her father, mother, and paternal grandmother; Drug abuse in her son; Heart disease in her father and mother; Kidney disease in her mother.    ROS:  Please see the history of present illness.   Otherwise, review of systems are positive for none.   All other systems are reviewed and negative.    PHYSICAL EXAM: VS:  BP 117/70   Pulse 86   Ht 5\' 4"  (1.626 m)   Wt 240 lb (108.9 kg)   SpO2 97%   BMI 41.20 kg/m  , BMI Body mass index is 41.2 kg/m. GENERAL:  Well appearing.   HEENT: Pupils equal round and reactive, fundi not visualized, oral mucosa unremarkable NECK:  No jugular venous distention, waveform within normal limits, carotid upstroke brisk and symmetric, no bruits LUNGS:  Clear to auscultation bilaterally HEART:  RRR.  PMI not displaced or sustained,S1 and S2 within normal limits, no S3, no S4, no clicks, no rubs, no murmurs ABD:  Flat, positive bowel sounds normal in frequency in pitch, no bruits, no rebound, no guarding, no midline pulsatile mass, no hepatomegaly, no splenomegaly EXT:  2 plus pulses throughout, no edema, no cyanosis no clubbing SKIN:  No rashes no nodules NEURO:  Cranial nerves II through XII grossly intact, motor grossly intact throughout PSYCH:  Cognitively intact, oriented to person place and time  EKG:  EKG is not ordered today. The ekg ordered 08/01/17 demonstrates sinus rhythm.  Rate 91 bpm.  LAFB.  LVH.  Poor R wave progression.  Echo 10/07/16: Study Conclusions  - Left ventricle: The cavity size was  normal. There was mild   concentric hypertrophy. Systolic  function was vigorous. The   estimated ejection fraction was in the range of 65% to 70%. Wall   motion was normal; there were no regional wall motion   abnormalities. Doppler parameters are consistent with abnormal   left ventricular relaxation (grade 1 diastolic dysfunction).   Doppler parameters are consistent with elevated ventricular   end-diastolic filling pressure. - Aortic valve: There was mild regurgitation. - Left atrium: The atrium was normal in size. - Right ventricle: The cavity size was normal. Wall thickness was   normal. Systolic function was normal. - Tricuspid valve: There was mild regurgitation. - Pulmonary arteries: Systolic pressure was within the normal   range. - Inferior vena cava: The vessel was normal in size. - Pericardium, extracardiac: There was no pericardial effusion.   Lexiscan Myoview 08/12/17:  The left ventricular ejection fraction is hyperdynamic (>65%).  Nuclear stress EF: 67%.  There was no ST segment deviation noted during stress.  The study is normal.  This is a low risk study.   Normal resting and stress perfusion. No ischemia or infarction EF 67%   Recent Labs: 05/17/2020: BUN 17; Creatinine, Ser 0.75; Hemoglobin 12.5; Platelets 353; Potassium 3.8; Sodium 138    Lipid Panel    Component Value Date/Time   CHOL 216 (H) 06/14/2019 1509   TRIG 169.0 (H) 06/14/2019 1509   HDL 55.60 06/14/2019 1509   CHOLHDL 4 06/14/2019 1509   VLDL 33.8 06/14/2019 1509   LDLCALC 127 (H) 06/14/2019 1509   LDLDIRECT 144.0 06/12/2016 1112      Wt Readings from Last 3 Encounters:  06/23/20 240 lb (108.9 kg)  05/17/20 223 lb 15.8 oz (101.6 kg)  06/23/19 224 lb (101.6 kg)      ASSESSMENT AND PLAN:  # Exertional palpitations: Resolved after reducing caffeine.   # Hypertension:  Blood pressure is well-controlled.  However she has a dry cough that she wonders if it is related to her  lisinopril.  We will stop lisinopril and start valsartan 80 mg daily.  Check a BMP in a week.  Continue amlodipine.  # Hyperlipidemia: Continue pravastatin.  # Prior PE/DVT: On Xarelto.   # Morbid obesity:  Ms. Grieder continues to struggle with weight gain.  Current medicines are reviewed at length with the patient today.  The patient does not have concerns regarding medicines.  The following changes have been made: Switch lisinopril to valsartan.  Labs/ tests ordered today include:   Orders Placed This Encounter  Procedures  . Basic metabolic panel     Disposition:   FU with Kailany Dinunzio C. Oval Linsey, MD, [facility] in 3 months.    This note was written with the assistance of speech recognition software.  Please excuse any transcriptional errors.    COVID-19 Education: The signs and symptoms of COVID-19 were discussed with the patient and how to seek care for testing (follow up with PCP or arrange E-visit).  The importance of social distancing was discussed today.  Time:   Today, I have spent 22 minutes with the patient with telehealth technology discussing the above problems.    Signed, Khole Branch C. Oval Linsey, MD, [facility]  06/23/2020 12:21 PM    [place]

## 2020-06-25 ENCOUNTER — Other Ambulatory Visit: Payer: Self-pay | Admitting: Psychiatry

## 2020-06-26 ENCOUNTER — Other Ambulatory Visit: Payer: Self-pay | Admitting: General Practice

## 2020-06-26 ENCOUNTER — Ambulatory Visit (INDEPENDENT_AMBULATORY_CARE_PROVIDER_SITE_OTHER): Payer: Medicare HMO

## 2020-06-26 VITALS — Ht 64.0 in | Wt 240.0 lb

## 2020-06-26 DIAGNOSIS — Z0000 Encounter for general adult medical examination without abnormal findings: Secondary | ICD-10-CM

## 2020-06-26 MED ORDER — FENOFIBRATE 160 MG PO TABS
160.0000 mg | ORAL_TABLET | Freq: Every day | ORAL | 0 refills | Status: DC
Start: 2020-06-26 — End: 2020-09-26

## 2020-06-26 NOTE — Patient Instructions (Signed)
Stacey Alvarez , Thank you for taking time to complete your Medicare Wellness Visit. I appreciate your ongoing commitment to your health goals. Please review the following plan we discussed and let me know if I can assist you in the future.   Screening recommendations/referrals: Colonoscopy: Completed 04/19/2015-Due 04/18/2020- Declined at this time. Please call the office to schedule if you change your mind. Mammogram: Completed 06/22/2019- Due 06/21/2020- Please call to schedule. Bone Density:Declined at this time. Please call the office to schedule if you change your mind. Recommended yearly ophthalmology/optometry visit for glaucoma screening and checkup Recommended yearly dental visit for hygiene and checkup  Vaccinations: Influenza vaccine: Due- May obtain at our office or your local pharmacy. Pneumococcal vaccine: Up to Date Tdap vaccine: Up to Date-Due-11/23/2025 Shingles vaccine: Discuss with pharmacy Covid-19:Completed vaccines  Advanced directives: Please bring a copy for your chart.  Conditions/risks identified: See problem list  Next appointment: Follow up in one year for your annual wellness visit    Preventive Care 65 Years and Older, Female Preventive care refers to lifestyle choices and visits with your health care provider that can promote health and wellness. What does preventive care include?  A yearly physical exam. This is also called an annual well check.  Dental exams once or twice a year.  Routine eye exams. Ask your health care provider how often you should have your eyes checked.  Personal lifestyle choices, including:  Daily care of your teeth and gums.  Regular physical activity.  Eating a healthy diet.  Avoiding tobacco and drug use.  Limiting alcohol use.  Practicing safe sex.  Taking low-dose aspirin every day.  Taking vitamin and mineral supplements as recommended by your health care provider. What happens during an annual well check? The  services and screenings done by your health care provider during your annual well check will depend on your age, overall health, lifestyle risk factors, and family history of disease. Counseling  Your health care provider may ask you questions about your:  Alcohol use.  Tobacco use.  Drug use.  Emotional well-being.  Home and relationship well-being.  Sexual activity.  Eating habits.  History of falls.  Memory and ability to understand (cognition).  Work and work Statistician.  Reproductive health. Screening  You may have the following tests or measurements:  Height, weight, and BMI.  Blood pressure.  Lipid and cholesterol levels. These may be checked every 5 years, or more frequently if you are over 28 years old.  Skin check.  Lung cancer screening. You may have this screening every year starting at age 40 if you have a 30-pack-year history of smoking and currently smoke or have quit within the past 15 years.  Fecal occult blood test (FOBT) of the stool. You may have this test every year starting at age 90.  Flexible sigmoidoscopy or colonoscopy. You may have a sigmoidoscopy every 5 years or a colonoscopy every 10 years starting at age 85.  Hepatitis C blood test.  Hepatitis B blood test.  Sexually transmitted disease (STD) testing.  Diabetes screening. This is done by checking your blood sugar (glucose) after you have not eaten for a while (fasting). You may have this done every 1-3 years.  Bone density scan. This is done to screen for osteoporosis. You may have this done starting at age 66.  Mammogram. This may be done every 1-2 years. Talk to your health care provider about how often you should have regular mammograms. Talk with your health care provider  about your test results, treatment options, and if necessary, the need for more tests. Vaccines  Your health care provider may recommend certain vaccines, such as:  Influenza vaccine. This is recommended  every year.  Tetanus, diphtheria, and acellular pertussis (Tdap, Td) vaccine. You may need a Td booster every 10 years.  Zoster vaccine. You may need this after age 100.  Pneumococcal 13-valent conjugate (PCV13) vaccine. One dose is recommended after age 58.  Pneumococcal polysaccharide (PPSV23) vaccine. One dose is recommended after age 51. Talk to your health care provider about which screenings and vaccines you need and how often you need them. This information is not intended to replace advice given to you by your health care provider. Make sure you discuss any questions you have with your health care provider. Document Released: 09/29/2015 Document Revised: 05/22/2016 Document Reviewed: 07/04/2015 Elsevier Interactive Patient Education  2017 [employer] Prevention in the Home Falls can cause injuries. They can happen to people of all ages. There are many things you can do to make your home safe and to help prevent falls. What can I do on the outside of my home?  Regularly fix the edges of walkways and driveways and fix any cracks.  Remove anything that might make you trip as you walk through a door, such as a raised step or threshold.  Trim any bushes or trees on the path to your home.  Use bright outdoor lighting.  Clear any walking paths of anything that might make someone trip, such as rocks or tools.  Regularly check to see if handrails are loose or broken. Make sure that both sides of any steps have handrails.  Any raised decks and porches should have guardrails on the edges.  Have any leaves, snow, or ice cleared regularly.  Use sand or salt on walking paths during winter.  Clean up any spills in your garage right away. This includes oil or grease spills. What can I do in the bathroom?  Use night lights.  Install grab bars by the toilet and in the tub and shower. Do not use towel bars as grab bars.  Use non-skid mats or decals in the tub or shower.  If  you need to sit down in the shower, use a plastic, non-slip stool.  Keep the floor dry. Clean up any water that spills on the floor as soon as it happens.  Remove soap buildup in the tub or shower regularly.  Attach bath mats securely with double-sided non-slip rug tape.  Do not have throw rugs and other things on the floor that can make you trip. What can I do in the bedroom?  Use night lights.  Make sure that you have a light by your bed that is easy to reach.  Do not use any sheets or blankets that are too big for your bed. They should not hang down onto the floor.  Have a firm chair that has side arms. You can use this for support while you get dressed.  Do not have throw rugs and other things on the floor that can make you trip. What can I do in the kitchen?  Clean up any spills right away.  Avoid walking on wet floors.  Keep items that you use a lot in easy-to-reach places.  If you need to reach something above you, use a strong step stool that has a grab bar.  Keep electrical cords out of the way.  Do not use floor polish or  wax that makes floors slippery. If you must use wax, use non-skid floor wax.  Do not have throw rugs and other things on the floor that can make you trip. What can I do with my stairs?  Do not leave any items on the stairs.  Make sure that there are handrails on both sides of the stairs and use them. Fix handrails that are broken or loose. Make sure that handrails are as long as the stairways.  Check any carpeting to make sure that it is firmly attached to the stairs. Fix any carpet that is loose or worn.  Avoid having throw rugs at the top or bottom of the stairs. If you do have throw rugs, attach them to the floor with carpet tape.  Make sure that you have a light switch at the top of the stairs and the bottom of the stairs. If you do not have them, ask someone to add them for you. What else can I do to help prevent falls?  Wear shoes  that:  Do not have high heels.  Have rubber bottoms.  Are comfortable and fit you well.  Are closed at the toe. Do not wear sandals.  If you use a stepladder:  Make sure that it is fully opened. Do not climb a closed stepladder.  Make sure that both sides of the stepladder are locked into place.  Ask someone to hold it for you, if possible.  Clearly mark and make sure that you can see:  Any grab bars or handrails.  First and last steps.  Where the edge of each step is.  Use tools that help you move around (mobility aids) if they are needed. These include:  Canes.  Walkers.  Scooters.  Crutches.  Turn on the lights when you go into a dark area. Replace any light bulbs as soon as they burn out.  Set up your furniture so you have a clear path. Avoid moving your furniture around.  If any of your floors are uneven, fix them.  If there are any pets around you, be aware of where they are.  Review your medicines with your doctor. Some medicines can make you feel dizzy. This can increase your chance of falling. Ask your doctor what other things that you can do to help prevent falls. This information is not intended to replace advice given to you by your health care provider. Make sure you discuss any questions you have with your health care provider. Document Released: 06/29/2009 Document Revised: 02/08/2016 Document Reviewed: 10/07/2014 Elsevier Interactive Patient Education  2017 [employer].

## 2020-06-26 NOTE — Telephone Encounter (Signed)
Please review

## 2020-06-29 ENCOUNTER — Encounter: Payer: Self-pay | Admitting: Psychiatry

## 2020-06-29 ENCOUNTER — Telehealth (INDEPENDENT_AMBULATORY_CARE_PROVIDER_SITE_OTHER): Payer: Medicare HMO | Admitting: Psychiatry

## 2020-06-29 DIAGNOSIS — F331 Major depressive disorder, recurrent, moderate: Secondary | ICD-10-CM

## 2020-06-29 DIAGNOSIS — R69 Illness, unspecified: Secondary | ICD-10-CM | POA: Diagnosis not present

## 2020-06-29 DIAGNOSIS — F411 Generalized anxiety disorder: Secondary | ICD-10-CM

## 2020-06-29 DIAGNOSIS — F5101 Primary insomnia: Secondary | ICD-10-CM

## 2020-06-29 NOTE — Progress Notes (Signed)
Stacey Alvarez [id number] 05-11-1956 64 y.o.  Virtual Visit via Telephone Note  I connected with pt on 06/29/20 at  9:30 AM EDT by telephone and verified that I am speaking with the correct person using two identifiers.   I discussed the limitations, risks, security and privacy concerns of performing an evaluation and management service by telephone and the availability of in person appointments. I also discussed with the patient that there may be a patient responsible charge related to this service. The patient expressed understanding and agreed to proceed.   I discussed the assessment and treatment plan with the patient. The patient was provided an opportunity to ask questions and all were answered. The patient agreed with the plan and demonstrated an understanding of the instructions.   The patient was advised to call back or seek an in-person evaluation if the symptoms worsen or if the condition fails to improve as anticipated.  I provided 30 minutes of non-face-to-face time during this encounter.  The patient was located at home.  The provider was located at [place].   Thayer Headings, PMHNP   Subjective:   Patient ID:  Stacey Alvarez is a 64 y.o. (DOB [birth year]) female.  Chief Complaint:  Chief Complaint  Patient presents with   Depression   Anxiety    HPI Stacey Alvarez presents for follow-up of depression and anxiety. She reports that she has not had a good week and spent 2 days in bed and has not been motivated. She reports that she has had constant nausea. She reports that she feels reaction to COVID booster has resolved.   She reports that her mood has been depressed and she has been feeling hopeless. Reports that she is having uncontrolled crying. She reports that energy and motivation have been very low. She reports that she has not wanted to be around people. She reports that she has been having frequent awakenings and cannot get comfortable. She reports "I  can't shut my mind off" and is experiencing rumination. Appetite has been low with nausea. She reports that she has been very anxious. She reports that she has been feeling impatient. She reports that she has had some irritability and reports that she is angry with herself and is also experiencing some anger towards her deceased husband, ie. "why did you leave me?" She reports that she has anxiety/panic s/s with the thought of leaving her house. She reports that the cardiologist ordered a new med Friday and she has not yet picked it up. Denies impulsive spending. She reports difficulty with concentration and has been starting projects without completing them. Denies SI.   Past Psychiatric Medication Trials: (Reports sensitivity to medication) Lamictal- reports that 150 mg felt that this was too high. Lithium- Ineffective Paxil- Took years ago in combination with other medications Lexapro-ineffective Zoloft- ineffective Prozac- Has tremor and unsteadiness on 80 mg. Has taken about 5 years. Unsure of benefit. Effexor XR- adverse reaction. Confusion. Cymbalta- Ineffective. Remeron- Wt gain. Trintellix- GI side effects. Some partial improvement., Itching on palms Wellbutrin- rashduring clinical trials. Gabapentin- felt unsteady. Suppressed breathing. Lyrica-Suppressed breathing. Temazepam Xanax Abilify-Ineffective. Risperdal- wt gain Rexulti- Akathisia Latuda Viibryd- Reports that Dr. Toy Care has told her that she was on it and she does not recall this. Propranolol- felt "revved up."  Review of Systems:  Review of Systems  Gastrointestinal: Positive for nausea. Negative for vomiting.  Musculoskeletal: Negative for gait problem.  Neurological: Positive for headaches. Negative for tremors.  Psychiatric/Behavioral:  Please refer to HPI    Medications: I have reviewed the patient's current medications.  Current Outpatient Medications  Medication Sig Dispense Refill    ALPRAZolam (XANAX) 1 MG tablet Take 1 tablet (1 mg total) by mouth 4 (four) times daily as needed for anxiety. (Patient taking differently: Take 1 mg by mouth 4 (four) times daily as needed for anxiety. 1 tablet in the morning , 2 tablets at bedtime) 360 tablet 0   amLODipine (NORVASC) 5 MG tablet TAKE 1/2 TABLET (2.5 MG) IN THE MORNING AND 1 WHOLE TABLET (5 MG) AT BEDTIME. (Patient taking differently: 5 mg. 1 WHOLE TABLET (5 MG) AT BEDTIME.) 135 tablet 3   butalbital-aspirin-caffeine-codeine (FIORINAL WITH CODEINE) 50-325-40-30 MG capsule Take 1 capsule by mouth every 6 (six) hours as needed for pain. 21 capsule 0   cetirizine (ZYRTEC) 10 MG tablet Take 10 mg by mouth daily.     fenofibrate 160 MG tablet Take 1 tablet (160 mg total) by mouth daily. 90 tablet 0   fluticasone (FLONASE) 50 MCG/ACT nasal spray Place 1 spray into both nostrils daily.     lamoTRIgine (LAMICTAL) 100 MG tablet Take 1 tablet (100 mg total) by mouth daily. 90 tablet 0   mycophenolate (CELLCEPT) 500 MG tablet Take 500 mg by mouth 2 (two) times daily.      pantoprazole (PROTONIX) 40 MG tablet TAKE ONE TABLET BY MOUTH TWICE A DAY (Patient taking differently: Take 40 mg by mouth daily. ) 180 tablet 1   pravastatin (PRAVACHOL) 40 MG tablet Take 1 tablet (40 mg total) by mouth daily. 90 tablet 3   predniSONE (DELTASONE) 2.5 MG tablet Take 0.25 mg by mouth daily with breakfast.   11   promethazine (PHENERGAN) 25 MG tablet TAKE ONE TABLET BY MOUTH EVERY 8 HOURS AS NEEDED FOR FOR NAUSEA AND VOMITING 30 tablet 0   temazepam (RESTORIL) 30 MG capsule Take 1 capsule (30 mg total) by mouth at bedtime. 90 capsule 0   TURMERIC PO Take by mouth.     vortioxetine HBr (TRINTELLIX) 5 MG TABS tablet Take 1 tablet (5 mg total) by mouth daily. 30 tablet 0   Cholecalciferol (VITAMIN D) 50 MCG (2000 UT) CAPS Take by mouth. (Patient not taking: Reported on 06/23/2020)     cyanocobalamin (,VITAMIN B-12,) 1000 MCG/ML injection Inject 1 mL  into the skin once a week for 4 weeks, then once a month for 5 months. 1 mL 8   lurasidone (LATUDA) 20 MG TABS tablet Take 1.5 tablets (30 mg total) by mouth daily with supper. 45 tablet 0   ondansetron (ZOFRAN-ODT) 4 MG disintegrating tablet Take 4 mg by mouth every 8 (eight) hours as needed. (Patient not taking: Reported on 06/26/2020)     OXYGEN Inhale 2 L into the lungs as needed (SOB).     pregabalin (LYRICA) 75 MG capsule Take 1 capsule (75 mg total) by mouth 2 (two) times daily. 60 capsule 1   SYRINGE-NEEDLE, DISP, 3 ML 25G X 1" 3 ML MISC Use to administer B12 injections 1 time a week for 4 weeks, then once a month for 5 months. 10 each 0   traMADol (ULTRAM) 50 MG tablet Take 50 mg by mouth every 8 (eight) hours as needed. (Patient not taking: Reported on 03/09/2020)     valsartan (DIOVAN) 80 MG tablet Take 1 tablet (80 mg total) by mouth daily. 90 tablet 3   Vitamin D, Ergocalciferol, (DRISDOL) 1.25 MG (50000 UNIT) CAPS capsule Take 1 capsule (50,000  Units total) by mouth every 7 (seven) days. 12 capsule 0   No current facility-administered medications for this visit.    Medication Side Effects: Headache and Nausea  Allergies:  Allergies  Allergen Reactions   Lisinopril     COUGH    Trintellix [Vortioxetine]    Macrobid [Nitrofurantoin Monohyd Macro] Diarrhea    Diarrhea, GI upset   Sulfa Antibiotics Rash    Past Medical History:  Diagnosis Date   Allergy    Anxiety    Arthritis    KNEES,BACK,HIPS   Depression    Fibromyalgia    GERD (gastroesophageal reflux disease)    Hyperlipidemia    Hypertension    IBS (irritable bowel syndrome)    Interstitial lung disease (HCC)    Pulmonary embolism ([place]) 09/2016   provoked- s/p rotator cuff repair   Sleep apnea     Family History  Problem Relation Age of Onset   Diabetes Mother    Heart disease Mother    Anxiety disorder Mother    Depression Mother    Kidney disease Mother    Diabetes  Father    Heart disease Father    Anxiety disorder Father    Depression Father    Diabetes Paternal Grandmother    Cystic fibrosis Son    Alcohol abuse Son    Drug abuse Son    Anxiety disorder Son    Depression Son    Breast cancer Maternal Aunt    Breast cancer Maternal Aunt     Social History   Socioeconomic History   Marital status: Widowed    Spouse name: Not on file   Number of children: 2   Years of education: 12   Highest education level: High school graduate  Occupational History   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   Vaping Use   Vaping Use: Never used  Substance and Sexual Activity   Alcohol use: No    Alcohol/week: 0.0 standard drinks   Drug use: No   Sexual activity: Not on file  Other Topics Concern   Not on file  Social History Narrative   Lives alone, widowed. Has one dtr. in [place]. [place] and one son in [place]   Social Determinants of Health   Financial Resource Strain: Low Risk    Difficulty of Paying Living Expenses: Not hard at all  Food Insecurity: No Food Insecurity   Worried About Charity fundraiser in the Last Year: Never true   Arboriculturist in the Last Year: Never true  Transportation Needs: No Transportation Needs   Lack of Transportation (Medical): No   Lack of Transportation (Non-Medical): No  Physical Activity: Insufficiently Active   Days of Exercise per Week: 4 days   Minutes of Exercise per Session: 20 min  Stress: No Stress Concern Present   Feeling of Stress : Only a little  Social Connections: Socially Isolated   Frequency of Communication with Friends and Family: More than three times a week   Frequency of Social Gatherings with Friends and Family: Never   Attends Religious Services: Never   Marine scientist or  Organizations: No   Attends Archivist Meetings: Never   Marital Status: Widowed  Intimate Partner Violence: Not At Risk   Fear of Current or Ex-Partner: No  Emotionally Abused: No   Physically Abused: No   Sexually Abused: No    Past Medical History, Surgical history, Social history, and Family history were reviewed and updated as appropriate.   Please see review of systems for further details on the patient's review from today.   Objective:   Physical Exam:  There were no vitals taken for this visit.  Physical Exam Neurological:     Mental Status: She is alert and oriented to person, place, and time.     Cranial Nerves: No dysarthria.  Psychiatric:        Attention and Perception: Attention and perception normal.        Mood and Affect: Mood is anxious and depressed. Affect is tearful.        Speech: Speech normal.        Behavior: Behavior is cooperative.        Thought Content: Thought content normal. Thought content is not paranoid or delusional. Thought content does not include homicidal or suicidal ideation. Thought content does not include homicidal or suicidal plan.        Cognition and Memory: Cognition and memory normal.        Judgment: Judgment normal.     Comments: Insight intact     Lab Review:     Component Value Date/Time   NA 138 05/17/2020 1146   K 3.8 05/17/2020 1146   CL 103 05/17/2020 1146   CO2 22 05/17/2020 1146   GLUCOSE 121 (H) 05/17/2020 1146   BUN 17 05/17/2020 1146   CREATININE 0.75 05/17/2020 1146   CALCIUM 9.4 05/17/2020 1146   PROT 7.1 06/14/2019 1509   ALBUMIN 4.6 06/14/2019 1509   AST 10 06/14/2019 1509   ALT 9 06/14/2019 1509   ALKPHOS 65 06/14/2019 1509   BILITOT 0.5 06/14/2019 1509   GFRNONAA >60 05/17/2020 1146   GFRAA >60 05/17/2020 1146       Component Value Date/Time   WBC 9.3 05/17/2020 1146   RBC 4.79 05/17/2020 1146   HGB 12.5 05/17/2020 1146   HCT 39.7 05/17/2020 1146   PLT 353 05/17/2020 1146    MCV 82.9 05/17/2020 1146   MCH 26.1 05/17/2020 1146   MCHC 31.5 05/17/2020 1146   RDW 13.4 05/17/2020 1146   LYMPHSABS 1.6 06/14/2019 1509   MONOABS 0.4 06/14/2019 1509   EOSABS 0.2 06/14/2019 1509   BASOSABS 0.1 06/14/2019 1509    No results found for: POCLITH, LITHIUM   No results found for: PHENYTOIN, PHENOBARB, VALPROATE, CBMZ   .res Assessment: Plan:   Discussed possible treatment options. Recommended not increasing Trintellix until nausea imrpoves since nausea may worsen at higher dose. Discussed that more time is needed for response to Trintellix. Discussed re-starting Latuda 40 mg 1/2 tab po qd to improve mood while awaiting response to [place]. Pt agrees to re-starting Latuda. Continue Trintellix 5 mg daily.  Will continue all other medications as prescribed.  Pt to follow-up in 3-4 weeks or sooner if clinically indicated.  Patient advised to contact office with any questions, adverse effects, or acute worsening in signs and symptoms.   Maelin was seen today for depression and anxiety.  Diagnoses and all orders for this visit:  Moderate recurrent major depression ([place])  Generalized anxiety disorder  Primary insomnia    Please see After Visit Summary for patient specific instructions.  Future Appointments  Date Time Provider [place]  07/12/2020 12:45 PM Thayer Headings, PMHNP [facility] None  09/22/2020  9:40 AM Skeet Latch,  MD [facility]    No orders of the defined types were placed in this encounter.     -------------------------------

## 2020-07-04 DIAGNOSIS — J849 Interstitial pulmonary disease, unspecified: Secondary | ICD-10-CM | POA: Diagnosis not present

## 2020-07-04 DIAGNOSIS — G4733 Obstructive sleep apnea (adult) (pediatric): Secondary | ICD-10-CM | POA: Diagnosis not present

## 2020-07-04 DIAGNOSIS — R062 Wheezing: Secondary | ICD-10-CM | POA: Diagnosis not present

## 2020-07-05 DIAGNOSIS — G4733 Obstructive sleep apnea (adult) (pediatric): Secondary | ICD-10-CM | POA: Diagnosis not present

## 2020-07-07 ENCOUNTER — Other Ambulatory Visit: Payer: Self-pay | Admitting: Family Medicine

## 2020-07-07 DIAGNOSIS — Z1231 Encounter for screening mammogram for malignant neoplasm of breast: Secondary | ICD-10-CM

## 2020-07-10 NOTE — Unmapped (Signed)
[facility] Refill Coordination Note    Specialty Medication(s) to be Shipped:   Transplant: mycophenolate mofetil 500mg     Other medication(s) to be shipped: No additional medications requested for fill at this time     Amber Bush, DOB: 18-Jun-1956  Phone: [phone] (home)       All above HIPAA information was verified with patient.     Was a Nurse, learning disability used for this call? No    Completed refill call assessment today to schedule patient's medication shipment from the [facility] [phone]).       Specialty medication(s) and dose(s) confirmed: Regimen is correct and unchanged.   Changes to medications: Amber Bush reports no changes at this time.  Changes to insurance: No  Questions for the pharmacist: No    Confirmed patient received Welcome Packet with first shipment. The patient will receive a drug information handout for each medication shipped and additional FDA Medication Guides as required.       DISEASE/MEDICATION-SPECIFIC INFORMATION        N/A    SPECIALTY MEDICATION ADHERENCE     Medication Adherence    Patient reported X missed doses in the last month: 0  Specialty Medication: Imbruvica 140mg   Patient is on additional specialty medications: No  Informant: patient                Mycophenolate 500 mg: 10 days of medicine on hand       SHIPPING     Shipping address confirmed in Epic.     Delivery Scheduled: Yes, Expected medication delivery date: 07/17/20.     Medication will be delivered via UPS to the prescription address in Epic WAM.    Amber Bush   [facility] Specialty Technician

## 2020-07-11 IMAGING — CT CT ANGIO CHEST
2 of 8 series · 19 of 36 positions shown · IV contrast (iopamidol)
Comparison: 10/07/2016 chest CT, chest x-ray from earlier in the
same day.

CLINICAL DATA: Shortness of breath and leg pain, history of P

EXAM:
CT ANGIOGRAPHY CHEST WITH CONTRAST
TECHNIQUE: Multidetector CT imaging of the chest was performed using the
standard protocol during bolus administration of intravenous
contrast. Multiplanar CT image reconstructions and MIPs were
obtained to evaluate the vascular anatomy.
CONTRAST:  100mL [id number] IOPAMIDOL ([id number]) INJECTION 76%

[Series 6: pe thins · axial · 0.84mm/px · z∈[+29,+294]mm · 18 of 297 slices shown]
[im 16/297  lung]
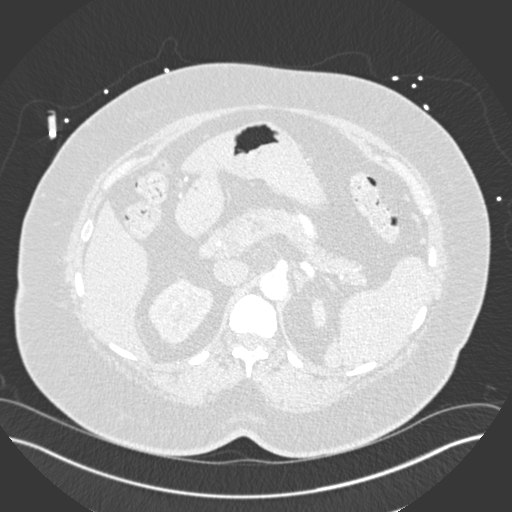
[im 32/297  mediastinal]
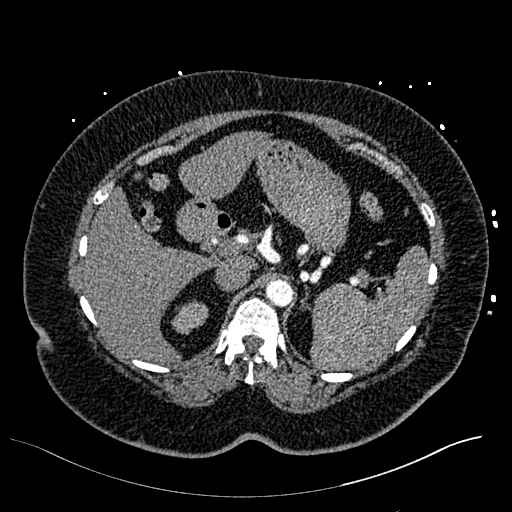
[im 47/297  lung]
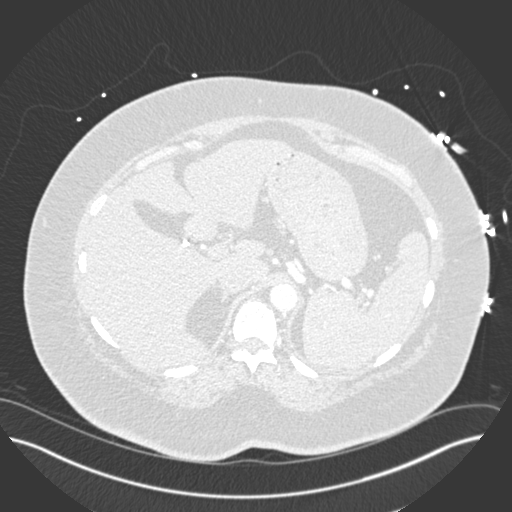
[im 63/297  mediastinal]
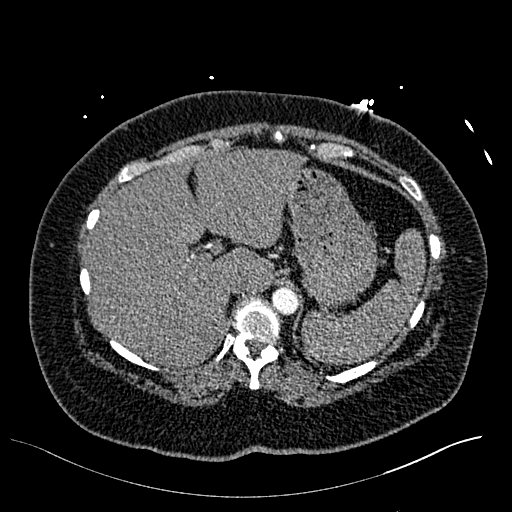
[im 78/297  lung]
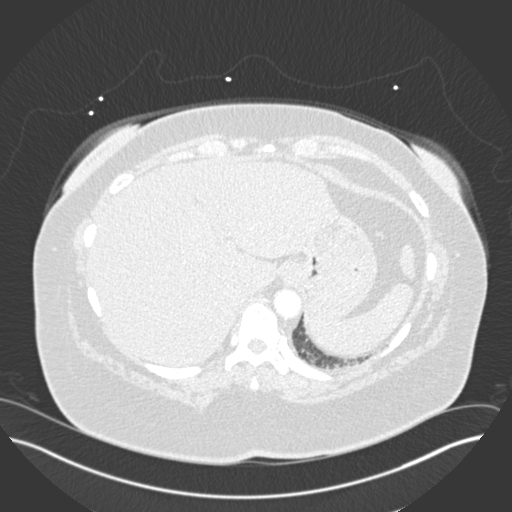
[im 94/297  mediastinal]
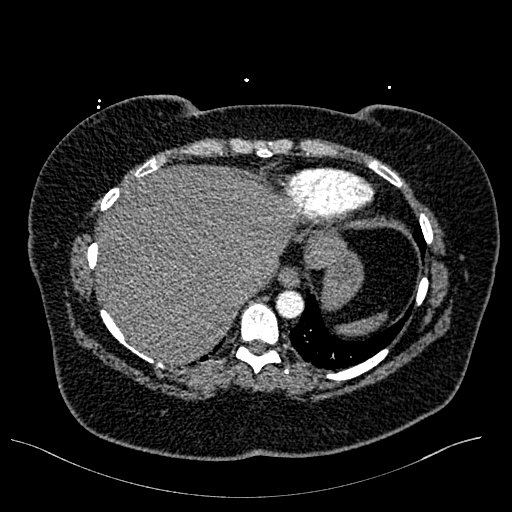
[im 110/297  lung]
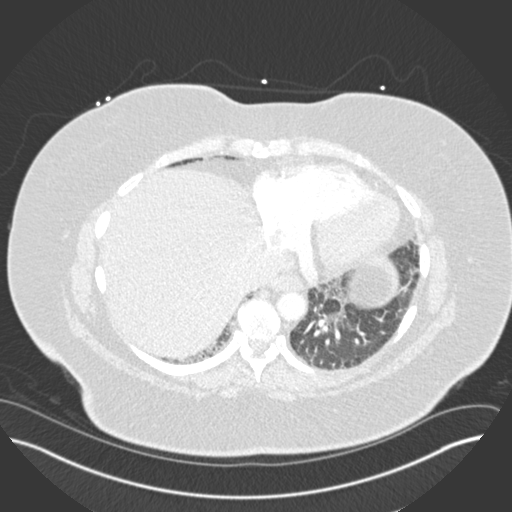
[im 125/297  mediastinal]
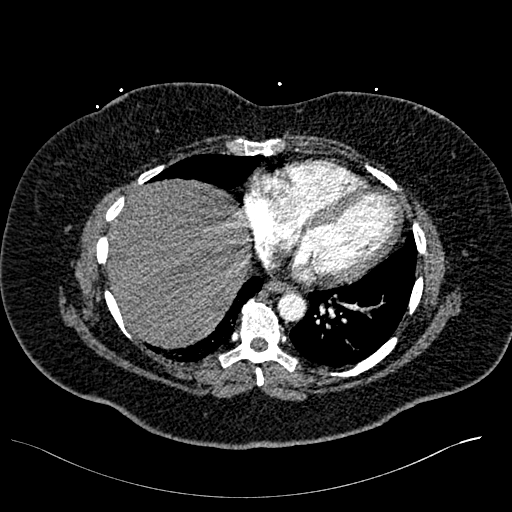
[im 141/297  lung]
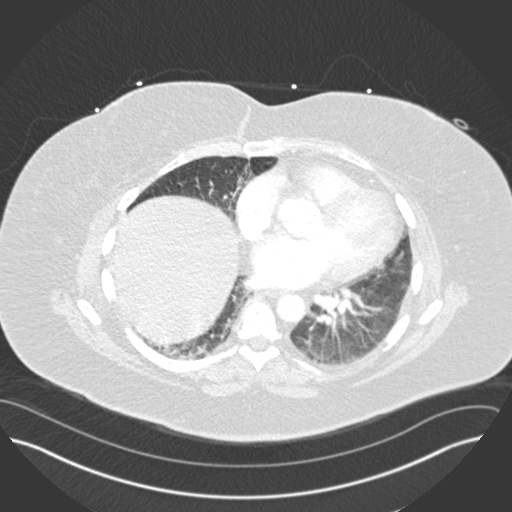
[im 156/297  mediastinal]
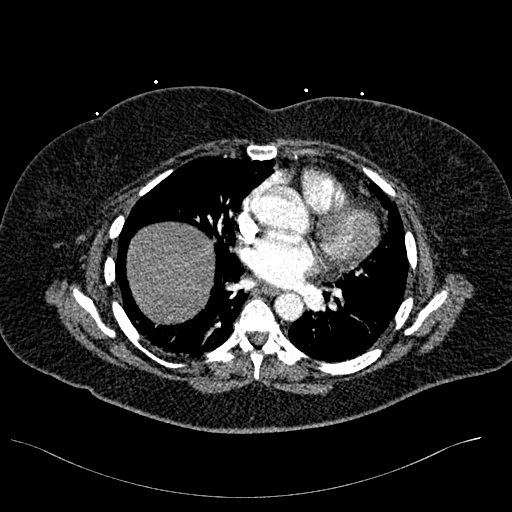
[im 172/297  lung]
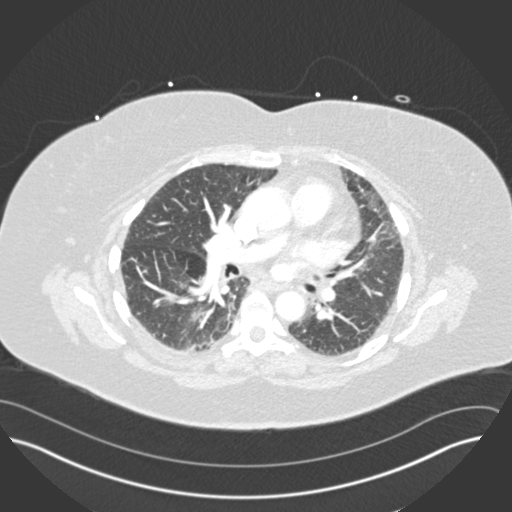
[im 187/297  mediastinal]
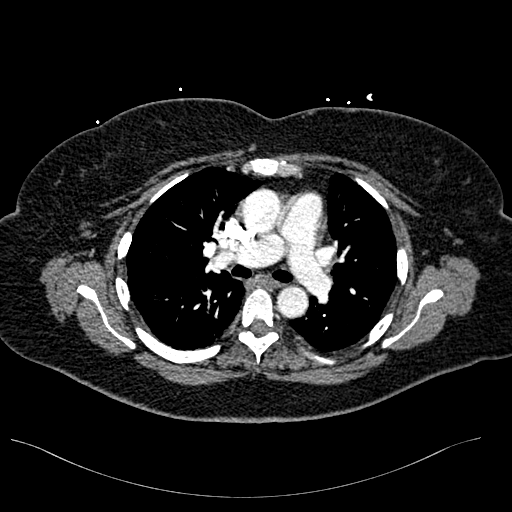
[im 203/297  lung]
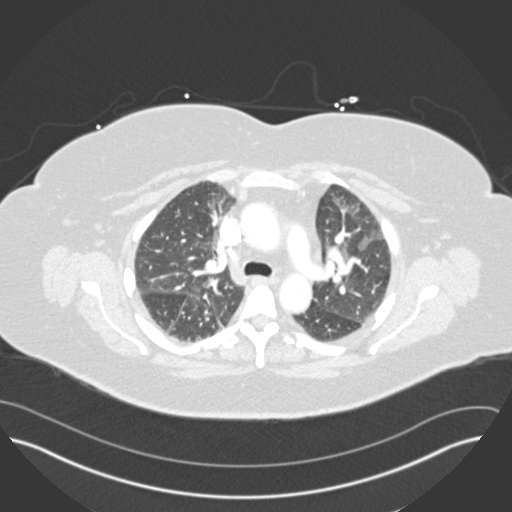
[im 219/297  mediastinal]
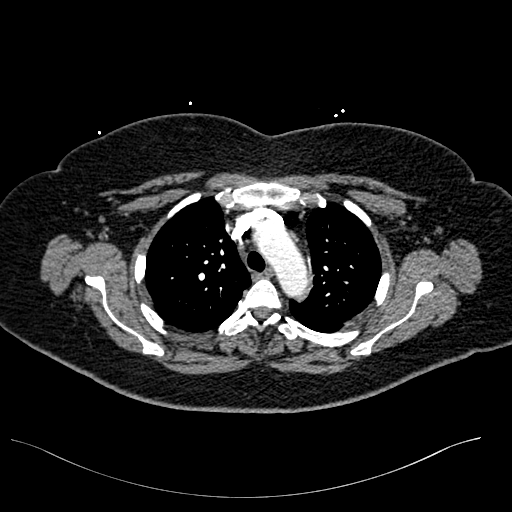
[im 234/297  lung]
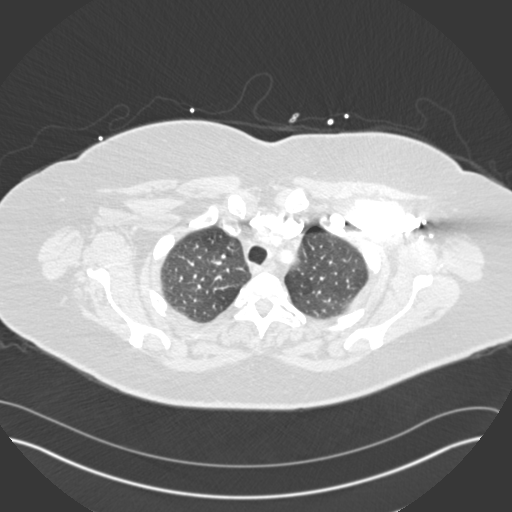
[im 250/297  mediastinal]
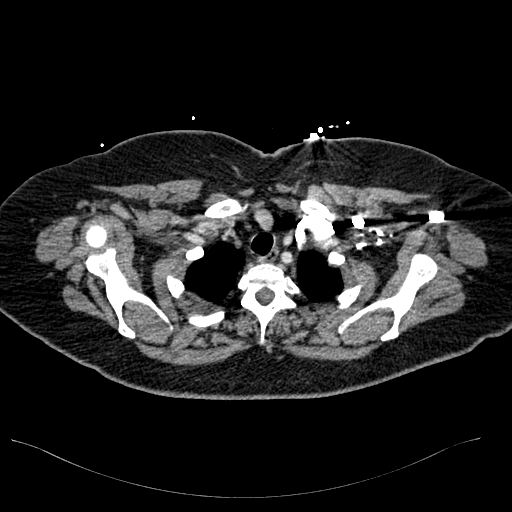
[im 265/297  lung]
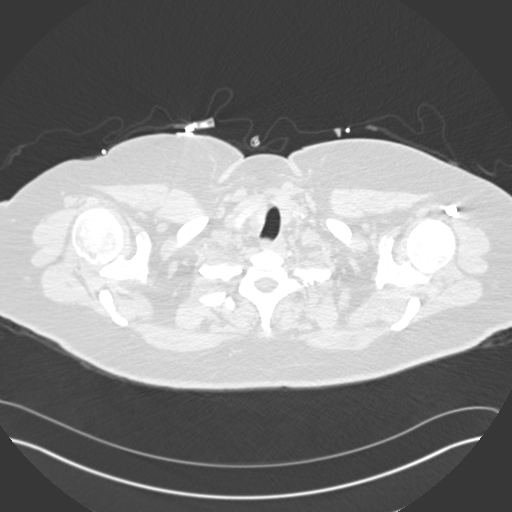
[im 281/297  mediastinal]
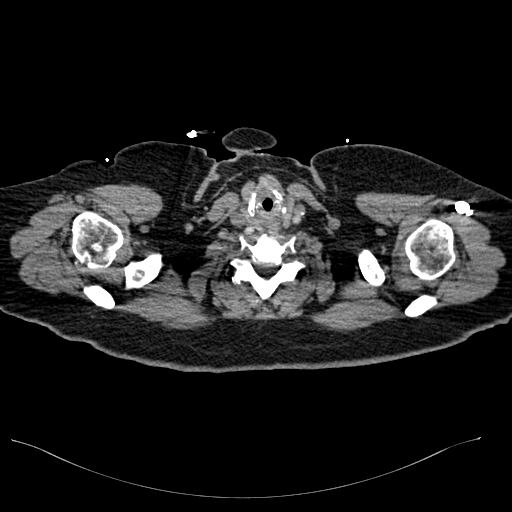

[Series 7: pe coronal mpr · coronal · 0.62mm/px · 1 of 151 slices shown]
[im 76/151  mediastinal]
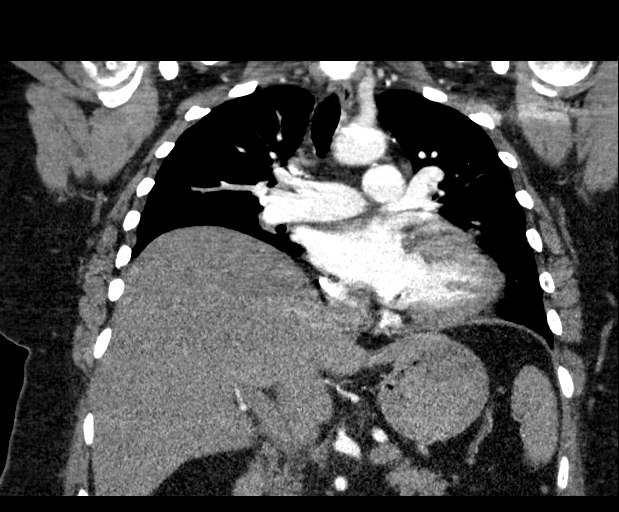

[19 of 36 positions shown; findings below may reference images not displayed]

FINDINGS: Cardiovascular: Thoracic aorta demonstrates a normal branching
pattern. Mild atherosclerotic changes are noted. No aneurysmal
dilatation is seen. No dissection is noted. No cardiac enlargement
is seen. The pulmonary artery shows a normal branching pattern. No
definitive filling defects are identified to suggest pulmonary
embolism.

Mediastinum/Nodes: Thoracic inlet is within normal limits. The
esophagus is unremarkable. No hilar or mediastinal adenopathy is
identified at this time.

Lungs/Pleura: Lungs are well aerated bilaterally. Mild right lower
lobe atelectatic changes are seen. No focal infiltrate or sizable
effusion is noted. Some minimal atelectatic changes are noted in the
left upper lobe.

Upper Abdomen: Visualized upper abdomen is within normal limits.

Musculoskeletal: No acute abnormality is noted.

Review of the MIP images confirms the above findings.
IMPRESSION: No evidence of pulmonary emboli.

Patchy atelectatic changes bilaterally without focal confluent
infiltrate. No sizable effusion is seen.

Aortic Atherosclerosis ([id number]-BH3.3).

## 2020-07-12 ENCOUNTER — Encounter: Payer: Self-pay | Admitting: Psychiatry

## 2020-07-12 ENCOUNTER — Telehealth (INDEPENDENT_AMBULATORY_CARE_PROVIDER_SITE_OTHER): Payer: Medicare HMO | Admitting: Psychiatry

## 2020-07-12 DIAGNOSIS — F411 Generalized anxiety disorder: Secondary | ICD-10-CM

## 2020-07-12 DIAGNOSIS — F5101 Primary insomnia: Secondary | ICD-10-CM

## 2020-07-12 DIAGNOSIS — R69 Illness, unspecified: Secondary | ICD-10-CM | POA: Diagnosis not present

## 2020-07-12 DIAGNOSIS — F32A Depression, unspecified: Secondary | ICD-10-CM

## 2020-07-12 MED ORDER — TEMAZEPAM 30 MG PO CAPS: 30.0000 mg | ORAL_CAPSULE | Freq: Every day | ORAL | 0 refills | Status: DC

## 2020-07-12 NOTE — Progress Notes (Signed)
Stacey Alvarez [id number] 05/26/1956 64 y.o.  Virtual Visit via Video Note  I connected with pt @ on 07/12/20 at 12:45 PM EDT by a video enabled telemedicine application and verified that I am speaking with the correct person using two identifiers.   I discussed the limitations of evaluation and management by telemedicine and the availability of in person appointments. The patient expressed understanding and agreed to proceed.  I discussed the assessment and treatment plan with the patient. The patient was provided an opportunity to ask questions and all were answered. The patient agreed with the plan and demonstrated an understanding of the instructions.   The patient was advised to call back or seek an in-person evaluation if the symptoms worsen or if the condition fails to improve as anticipated.  I provided 30 minutes of non-face-to-face time during this encounter.  The patient was located at home.  The provider was located at [place].   Stacey Alvarez, PMHNP   Subjective:   Patient ID:  Stacey Alvarez is a 64 y.o. (DOB [birth year]) female.  Chief Complaint:  Chief Complaint  Patient presents with   Follow-up    Depression, anxiety    HPI Stacey Alvarez presents for follow-up of depression, anxiety, and insomnia. She reports that her mood improved with Latuda. She reports that she has had intolerable GI side effects with Trintellix. She reports that her mood improved after several days of starting Latuda 40 mg about 1.5 weeks ago. She noticed some improvement in mood with Latuda 20 mg. Denies irritability. She reports that she is feeling more hopeful. She reports that she has been isolating less. Has been getting out to run errands. Went out to dinner with sister-in-law last night. She reports that she is no longer crying uncontrollably and reports that crying only in response to grief. She reports improved energy and motivation. She has been calling [facility] to  seek assistance with cost of Latuda. She has been organizing some things around her house. She reports that she has been listening to more encouraging and uplifting music. She reports minimal depression. She reports that she is getting some joy out of things, such as seeing the leaves change. She reports that October is anniversary of father's death and birthday. Sleep has been ok. Has been getting up earlier than usual and is getting showered and dressed. Appetite is ok. Trying to drink more water instead of soft drinks. She reports that she has not noticed wt. gain. She reports that she has not been crying in response to triggers. Improved concentration and notices she can comprehend what she is reading more easily. Denies excessive spending. She reports that she has been more focused on saving. Denies SI.   Has been taking Xanax 1 mg in the morning and 2 mg at HS.   Has started Grief Share virtual class, called "Healing the Broken Hearted." Has been seeing Stacey Alvarez, [facility].   She is looking forward to two concerts with her daughter. Also looking forward to daughter staying the night with her around the concert.   Past Psychiatric Medication Trials: (Reports sensitivity to medication) Lamictal- reports that 150 mg felt that this was too high. Lithium- Ineffective Paxil- Took years ago in combination with other medications Lexapro-ineffective Zoloft- ineffective Prozac- Has tremor and unsteadiness on 80 mg. Has taken about 5 years. Unsure of benefit. Effexor XR- adverse reaction. Confusion. Cymbalta- Ineffective. Remeron- Wt gain. Trintellix- GI side effects. Some partial improvement., Itching on palms Wellbutrin- rashduring clinical trials.  Gabapentin- felt unsteady. Suppressed breathing. Lyrica-Suppressed breathing. Temazepam Xanax Abilify-Ineffective. Risperdal- wt gain Rexulti- Akathisia Latuda Viibryd- Reports that Dr. Toy Care has told her that she was on it and she does  not recall this. Propranolol- felt "revved up."   Review of Systems:  Review of Systems  Musculoskeletal: Negative for gait problem.       Sprained ankle.  Neurological: Negative for tremors.  Psychiatric/Behavioral:       Please refer to HPI    Medications: I have reviewed the patient's current medications.  Current Outpatient Medications  Medication Sig Dispense Refill   ALPRAZolam (XANAX) 1 MG tablet Take 1 tablet (1 mg total) by mouth 4 (four) times daily as needed for anxiety. (Patient taking differently: Take 1 mg by mouth 4 (four) times daily as needed for anxiety. 1 tablet in the morning , 2 tablets at bedtime) 360 tablet 0   amLODipine (NORVASC) 5 MG tablet TAKE 1/2 TABLET (2.5 MG) IN THE MORNING AND 1 WHOLE TABLET (5 MG) AT BEDTIME. (Patient taking differently: 5 mg. 1 WHOLE TABLET (5 MG) AT BEDTIME.) 135 tablet 3   butalbital-aspirin-caffeine-codeine (FIORINAL WITH CODEINE) 50-325-40-30 MG capsule Take 1 capsule by mouth every 6 (six) hours as needed for pain. 21 capsule 0   cetirizine (ZYRTEC) 10 MG tablet Take 10 mg by mouth daily.     fenofibrate 160 MG tablet Take 1 tablet (160 mg total) by mouth daily. 90 tablet 0   fluticasone (FLONASE) 50 MCG/ACT nasal spray Place 1 spray into both nostrils daily.     lamoTRIgine (LAMICTAL) 100 MG tablet Take 1 tablet (100 mg total) by mouth daily. 90 tablet 0   lurasidone (LATUDA) 40 MG TABS tablet Take 40 mg by mouth daily with breakfast.     mycophenolate (CELLCEPT) 500 MG tablet Take 500 mg by mouth 2 (two) times daily.      pantoprazole (PROTONIX) 40 MG tablet TAKE ONE TABLET BY MOUTH TWICE A DAY (Patient taking differently: Take 40 mg by mouth daily. ) 180 tablet 1   pravastatin (PRAVACHOL) 40 MG tablet Take 1 tablet (40 mg total) by mouth daily. 90 tablet 3   predniSONE (DELTASONE) 2.5 MG tablet Take 0.25 mg by mouth daily with breakfast.   11   [START ON 07/19/2020] temazepam (RESTORIL) 30 MG capsule Take 1  capsule (30 mg total) by mouth at bedtime. 90 capsule 0   TURMERIC PO Take by mouth.     valsartan (DIOVAN) 80 MG tablet Take 1 tablet (80 mg total) by mouth daily. 90 tablet 3   Cholecalciferol (VITAMIN D) 50 MCG (2000 UT) CAPS Take by mouth. (Patient not taking: Reported on 06/23/2020)     cyanocobalamin (,VITAMIN B-12,) 1000 MCG/ML injection Inject 1 mL into the skin once a week for 4 weeks, then once a month for 5 months. 1 mL 8   ondansetron (ZOFRAN-ODT) 4 MG disintegrating tablet Take 4 mg by mouth every 8 (eight) hours as needed. (Patient not taking: Reported on 06/26/2020)     OXYGEN Inhale 2 L into the lungs as needed (SOB).     pregabalin (LYRICA) 75 MG capsule Take 1 capsule (75 mg total) by mouth 2 (two) times daily. 60 capsule 1   promethazine (PHENERGAN) 25 MG tablet TAKE ONE TABLET BY MOUTH EVERY 8 HOURS AS NEEDED FOR FOR NAUSEA AND VOMITING 30 tablet 0   SYRINGE-NEEDLE, DISP, 3 ML 25G X 1" 3 ML MISC Use to administer B12 injections 1 time a week for 4  weeks, then once a month for 5 months. 10 each 0   traMADol (ULTRAM) 50 MG tablet Take 50 mg by mouth every 8 (eight) hours as needed. (Patient not taking: Reported on 03/09/2020)     Vitamin D, Ergocalciferol, (DRISDOL) 1.25 MG (50000 UNIT) CAPS capsule Take 1 capsule (50,000 Units total) by mouth every 7 (seven) days. 12 capsule 0   No current facility-administered medications for this visit.    Medication Side Effects: None  Denies involuntary movements and none noted on video.   Allergies:  Allergies  Allergen Reactions   Lisinopril     COUGH    Trintellix [Vortioxetine]    Macrobid [Nitrofurantoin Monohyd Macro] Diarrhea    Diarrhea, GI upset   Sulfa Antibiotics Rash    Past Medical History:  Diagnosis Date   Allergy    Anxiety    Arthritis    KNEES,BACK,HIPS   Depression    Fibromyalgia    GERD (gastroesophageal reflux disease)    Hyperlipidemia    Hypertension    IBS (irritable bowel  syndrome)    Interstitial lung disease (HCC)    Pulmonary embolism ([place]) 09/2016   provoked- s/p rotator cuff repair   Sleep apnea     Family History  Problem Relation Age of Onset   Diabetes Mother    Heart disease Mother    Anxiety disorder Mother    Depression Mother    Kidney disease Mother    Diabetes Father    Heart disease Father    Anxiety disorder Father    Depression Father    Diabetes Paternal Grandmother    Cystic fibrosis Son    Alcohol abuse Son    Drug abuse Son    Anxiety disorder Son    Depression Son    Breast cancer Maternal Aunt    Breast cancer Maternal Aunt     Social History   Socioeconomic History   Marital status: Widowed    Spouse name: Not on file   Number of children: 2   Years of education: 12   Highest education level: High school graduate  Occupational History   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   Vaping Use   Vaping Use: Never used  Substance and Sexual Activity   Alcohol use: No    Alcohol/week: 0.0 standard drinks   Drug use: No   Sexual activity: Not on file  Other Topics Concern   Not on file  Social History Narrative   Lives alone, widowed. Has one dtr. in [place]. [place] and one son in [place]   Social Determinants of Health   Financial Resource Strain: Low Risk    Difficulty of Paying Living Expenses: Not hard at all  Food Insecurity: No Food Insecurity   Worried About Charity fundraiser in the Last Year: Never true   Arboriculturist in the Last Year: Never true  Transportation Needs: No Transportation Needs   Lack of Transportation (Medical): No   Lack of Transportation (Non-Medical): No  Physical Activity: Insufficiently Active   Days of Exercise per Week: 4 days   Minutes of Exercise per Session: 20 min   Stress: No Stress Concern Present   Feeling of Stress : Only a little  Social Connections: Socially Isolated   Frequency of Communication with Friends and Family:  More than three times a week   Frequency of Social Gatherings with Friends and Family: Never   Attends Religious Services: Never   Marine scientist or Organizations: No   Attends Archivist Meetings: Never   Marital Status: Widowed  Human resources officer Violence: Not At Risk   Fear of Current or Ex-Partner: No   Emotionally Abused: No   Physically Abused: No   Sexually Abused: No    Past Medical History, Surgical history, Social history, and Family history were reviewed and updated as appropriate.   Please see review of systems for further details on the patient's review from today.   Objective:   Physical Exam:  There were no vitals taken for this visit.  Physical Exam Neurological:     Mental Status: She is alert and oriented to person, place, and time.     Cranial Nerves: No dysarthria.  Psychiatric:        Attention and Perception: Attention and perception normal.        Mood and Affect: Mood normal.        Speech: Speech normal.        Behavior: Behavior is cooperative.        Thought Content: Thought content normal. Thought content is not paranoid or delusional. Thought content does not include homicidal or suicidal ideation. Thought content does not include homicidal or suicidal plan.        Cognition and Memory: Cognition and memory normal.        Judgment: Judgment normal.     Comments: Insight intact     Lab Review:     Component Value Date/Time   NA 138 05/17/2020 1146   K 3.8 05/17/2020 1146   CL 103 05/17/2020 1146   CO2 22 05/17/2020 1146   GLUCOSE 121 (H) 05/17/2020 1146   BUN 17 05/17/2020 1146   CREATININE 0.75 05/17/2020 1146   CALCIUM 9.4 05/17/2020 1146   PROT 7.1 06/14/2019 1509   ALBUMIN 4.6 06/14/2019 1509   AST 10 06/14/2019 1509   ALT 9 06/14/2019 1509    ALKPHOS 65 06/14/2019 1509   BILITOT 0.5 06/14/2019 1509   GFRNONAA >60 05/17/2020 1146   GFRAA >60 05/17/2020 1146       Component Value Date/Time   WBC 9.3 05/17/2020 1146   RBC 4.79 05/17/2020 1146   HGB 12.5 05/17/2020 1146   HCT 39.7 05/17/2020 1146   PLT 353 05/17/2020 1146   MCV 82.9 05/17/2020 1146   MCH 26.1 05/17/2020 1146   MCHC 31.5 05/17/2020 1146   RDW 13.4 05/17/2020 1146   LYMPHSABS 1.6 06/14/2019 1509   MONOABS 0.4 06/14/2019 1509   EOSABS 0.2 06/14/2019 1509   BASOSABS 0.1 06/14/2019 1509    No results found for: POCLITH, LITHIUM   No results found for: PHENYTOIN, PHENOBARB, VALPROATE, CBMZ   .res Assessment: Plan:   Continue Latuda 40 mg daily with food for depression. Continue alprazolam for anxiety. Continue lamotrigine 100 mg daily for mood signs and symptoms. Continue temazepam 30 mg at bedtime for insomnia. Recommend continuing psychotherapy with Stacey Alvarez, [facility] and grief share. Patient to follow-up with this provider in 4 weeks or sooner if clinically indicated. Patient advised to contact office with any questions, adverse effects, or acute worsening in signs and symptoms.  Myrta was seen today for follow-up.  Diagnoses and all orders for this visit:  Depression, unspecified depression type  Primary insomnia -     temazepam (RESTORIL) 30 MG  capsule; Take 1 capsule (30 mg total) by mouth at bedtime.  Generalized anxiety disorder     Please see After Visit Summary for patient specific instructions.  Future Appointments  Date Time Provider [place]  08/09/2020 11:50 AM [facility] 3 [facility]  09/22/2020  9:40 AM Skeet Latch, MD [facility]    No orders of the defined types were placed in this encounter.     -------------------------------

## 2020-07-14 ENCOUNTER — Ambulatory Visit: Payer: Medicare HMO | Admitting: Family Medicine

## 2020-07-14 ENCOUNTER — Other Ambulatory Visit: Payer: Self-pay | Admitting: Pharmacist Clinician (PhC)/ Clinical Pharmacy Specialist

## 2020-07-14 DIAGNOSIS — Z5181 Encounter for therapeutic drug level monitoring: Secondary | ICD-10-CM | POA: Diagnosis not present

## 2020-07-14 LAB — BASIC METABOLIC PANEL
BUN/Creatinine Ratio: 16 (ref 12–28)
BUN: 13 mg/dL (ref 8–27)
CO2: 19 mmol/L — ABNORMAL LOW (ref 20–29)
Calcium: 9.6 mg/dL (ref 8.7–10.3)
Chloride: 99 mmol/L (ref 96–106)
Creatinine, Ser: 0.8 mg/dL (ref 0.57–1.00)
GFR calc Af Amer: 89 mL/min/{1.73_m2} (ref >59)
GFR calc non Af Amer: 77 mL/min/{1.73_m2} (ref >59)
Glucose: 95 mg/dL (ref 65–99)
Potassium: 4.1 mmol/L (ref 3.5–5.2)
Sodium: 138 mmol/L (ref 134–144)

## 2020-07-14 MED ORDER — HYDRALAZINE HCL 25 MG PO TABS: 25.0000 mg | ORAL_TABLET | Freq: Two times a day (BID) | ORAL | 3 refills | Status: DC

## 2020-07-14 MED FILL — MYCOPHENOLATE MOFETIL 500 MG TABLET: ORAL | 30 days supply | Qty: 60 | Fill #4

## 2020-07-14 MED FILL — MYCOPHENOLATE MOFETIL 500 MG TABLET: 30 days supply | Qty: 60 | Fill #4 | Status: AC

## 2020-07-19 ENCOUNTER — Telehealth: Payer: Self-pay | Admitting: Cardiovascular Disease

## 2020-07-19 NOTE — Telephone Encounter (Signed)
Jody with [facility] is requesting to speak with Dr. Blenda Mounts nurse regarding compliance for Metformin 500. She stated patient does not need a refill, however, a call may be returned to further discuss.  Phone Number: [phone]

## 2020-07-19 NOTE — Telephone Encounter (Signed)
Coverning for triage today.   Called and left voice mail. Likely this medication needs to manage by PCP. Marland Kitchen Will send for recommendation to by Dr. Oval Linsey.

## 2020-07-20 NOTE — Telephone Encounter (Signed)
Left message to inform Jody that PCP will need to manage diabetic medications.

## 2020-07-20 NOTE — Telephone Encounter (Signed)
Recommend PCP follow up for diabetes meds.

## 2020-07-25 DIAGNOSIS — G4733 Obstructive sleep apnea (adult) (pediatric): Secondary | ICD-10-CM | POA: Diagnosis not present

## 2020-07-31 MED ORDER — LISINOPRIL 20 MG PO TABS: 20.0000 mg | ORAL_TABLET | Freq: Every day | ORAL | 3 refills | Status: DC

## 2020-07-31 NOTE — Telephone Encounter (Signed)
Returned call to pt she states that she is still having the dry cough even since stopping the lisinopril. She thinks that her BP was better controlled on the lisinopril she would like to restart. She will stop the valsartan and re-start the lisinopril. She will continue to take her BP to see if she will need to increase. She states that she has some lisinopril 20mg  left to take she will call pharmacy when she needs this refilled

## 2020-08-01 ENCOUNTER — Other Ambulatory Visit: Payer: Self-pay | Admitting: Psychiatry

## 2020-08-01 ENCOUNTER — Other Ambulatory Visit: Payer: Self-pay | Admitting: Cardiology

## 2020-08-01 DIAGNOSIS — F332 Major depressive disorder, recurrent severe without psychotic features: Secondary | ICD-10-CM

## 2020-08-03 ENCOUNTER — Telehealth: Payer: Self-pay | Admitting: *Deleted

## 2020-08-03 NOTE — Telephone Encounter (Signed)
Spoke with patient regarding Stacey Alvarez from 11/14 Dr Oval Linsey had offered going back on Valsartan at higher dose, patient declined stating blood pressure better and she felt better back on Lisinopril

## 2020-08-04 DIAGNOSIS — G4733 Obstructive sleep apnea (adult) (pediatric): Secondary | ICD-10-CM | POA: Diagnosis not present

## 2020-08-04 DIAGNOSIS — M25572 Pain in left ankle and joints of left foot: Secondary | ICD-10-CM | POA: Diagnosis not present

## 2020-08-04 DIAGNOSIS — J849 Interstitial pulmonary disease, unspecified: Secondary | ICD-10-CM | POA: Diagnosis not present

## 2020-08-04 DIAGNOSIS — R062 Wheezing: Secondary | ICD-10-CM | POA: Diagnosis not present

## 2020-08-04 NOTE — Unmapped (Signed)
[facility] Refill Coordination Note    Specialty Medication(s) to be Shipped:   General Specialty: mycophenolate 500mg     Other medication(s) to be shipped: No additional medications requested for fill at this time     Amber Bush, DOB: 12-29-1955  Phone: [phone] (home)       All above HIPAA information was verified with patient.     Was a Nurse, learning disability used for this call? No    Completed refill call assessment today to schedule patient's medication shipment from the [facility] [phone]).       Specialty medication(s) and dose(s) confirmed: Regimen is correct and unchanged.   Changes to medications: Amber Bush reports no changes at this time.  Changes to insurance: No  Questions for the pharmacist: No    Confirmed patient received Welcome Packet with first shipment. The patient will receive a drug information handout for each medication shipped and additional FDA Medication Guides as required.       DISEASE/MEDICATION-SPECIFIC INFORMATION        N/A    SPECIALTY MEDICATION ADHERENCE     Medication Adherence    Patient reported X missed doses in the last month: 0  Specialty Medication: Mycophenolate 500mg   Patient is on additional specialty medications: No  Patient is on more than two specialty medications: No                Mycophenolate 500 mg: 14 days of medicine on hand         SHIPPING     Shipping address confirmed in Epic.     Delivery Scheduled: Yes, Expected medication delivery date: 08/15/20.     Medication will be delivered via UPS to the prescription address in Epic WAM.    Nancy Nordmann [facility] Specialty Technician

## 2020-08-07 ENCOUNTER — Telehealth: Payer: Self-pay | Admitting: Psychiatry

## 2020-08-07 ENCOUNTER — Other Ambulatory Visit: Payer: Self-pay | Admitting: Family Medicine

## 2020-08-07 NOTE — Telephone Encounter (Signed)
Please review

## 2020-08-07 NOTE — Telephone Encounter (Signed)
Stacey Alvarez called wanting to know if Latuda causes insomnia.  She hasn't been able to sleep in the past 2 weeks and the Latuda is the only medication change in the past 2 weeks.  Please call.

## 2020-08-07 NOTE — Telephone Encounter (Signed)
Rtc to patient and she is taking Latuda 40 mg, she is cutting the 80 mg in 1/2. Of course her pharmacist said that should never be done, I advised her long term that wouldn't be ideal but it's fine for now. She agreed. She has trouble slowing down her thoughts at night, she's tired but just can't go to sleep and if she does doze she is waking up. She is asking if she can increase her Restoril 30 mg? She's been on that dose for awhile.

## 2020-08-08 MED ORDER — BUTALBITAL-ASA-CAFF-CODEINE 50-325-40-30 MG PO CAPS: 1.0000 | ORAL_CAPSULE | Freq: Four times a day (QID) | ORAL | 0 refills | Status: DC | PRN

## 2020-08-08 NOTE — Telephone Encounter (Signed)
LFD 06/15/20 #21 with no refills LOV 02/23/20 NOV none

## 2020-08-08 NOTE — Telephone Encounter (Signed)
Rtc to patient and discussed recommendation. She will come by tomorrow to pick up samples 11/24.

## 2020-08-09 ENCOUNTER — Ambulatory Visit: Payer: Medicare HMO

## 2020-08-11 ENCOUNTER — Other Ambulatory Visit: Payer: Self-pay | Admitting: Family Medicine

## 2020-08-11 ENCOUNTER — Other Ambulatory Visit: Payer: Self-pay | Admitting: Psychiatry

## 2020-08-11 DIAGNOSIS — F411 Generalized anxiety disorder: Secondary | ICD-10-CM

## 2020-08-12 ENCOUNTER — Other Ambulatory Visit: Payer: Self-pay | Admitting: Family Medicine

## 2020-08-13 NOTE — Telephone Encounter (Signed)
Apt 12/06

## 2020-08-14 ENCOUNTER — Other Ambulatory Visit: Payer: Self-pay

## 2020-08-14 ENCOUNTER — Ambulatory Visit (INDEPENDENT_AMBULATORY_CARE_PROVIDER_SITE_OTHER): Payer: Medicare HMO | Admitting: Psychiatry

## 2020-08-14 ENCOUNTER — Other Ambulatory Visit: Payer: Self-pay | Admitting: Family Medicine

## 2020-08-14 ENCOUNTER — Encounter: Payer: Self-pay | Admitting: Psychiatry

## 2020-08-14 DIAGNOSIS — F332 Major depressive disorder, recurrent severe without psychotic features: Secondary | ICD-10-CM

## 2020-08-14 DIAGNOSIS — F411 Generalized anxiety disorder: Secondary | ICD-10-CM

## 2020-08-14 DIAGNOSIS — R69 Illness, unspecified: Secondary | ICD-10-CM | POA: Diagnosis not present

## 2020-08-14 MED ORDER — ALPRAZOLAM 1 MG PO TABS: 1.0000 mg | ORAL_TABLET | Freq: Four times a day (QID) | ORAL | 0 refills | Status: DC | PRN

## 2020-08-14 MED ORDER — PAROXETINE HCL 20 MG PO TABS: ORAL_TABLET | ORAL | 1 refills | Status: DC

## 2020-08-14 MED ORDER — LURASIDONE HCL 60 MG PO TABS
30.0000 mg | ORAL_TABLET | Freq: Every day | ORAL | 0 refills | Status: DC
Start: 2020-08-14 — End: 2020-10-03

## 2020-08-14 MED ORDER — LAMOTRIGINE 100 MG PO TABS: 100.0000 mg | ORAL_TABLET | Freq: Every day | ORAL | 0 refills | Status: DC

## 2020-08-14 MED FILL — MYCOPHENOLATE MOFETIL 500 MG TABLET: ORAL | 30 days supply | Qty: 60 | Fill #5

## 2020-08-14 MED FILL — MYCOPHENOLATE MOFETIL 500 MG TABLET: 30 days supply | Qty: 60 | Fill #5 | Status: AC

## 2020-08-14 NOTE — Progress Notes (Signed)
Stacey Alvarez [id number] [birth year] 64 y.o.  Subjective:   Patient ID:  Stacey Alvarez is a 64 y.o. (DOB 1955/10/25) female.  Chief Complaint:  Chief Complaint  Patient presents with   Depression   Anxiety   Insomnia    HPI [name] presents to the office today for follow-up of mood disturbance, anxiety, and insomnia. Worsening mood over the last 3 weeks. Increased depression. Has noticed irritability. She reports that she has been having passive death wishes and at night hopes that she does not wake up the next day. Denies SI since she would not want her daughter to go through that. She reports that she has not been interested in decorating for Christmas like she normally would. She reports feelings of hopelessness.   Increased Latuda to 60 mg on 08/07/20 and has had nausea and HA. Denies any improvement in mood s/s and has been crying more. Has had increased restlessness since dose increase.   Has been having difficulty falling and staying asleep. Reports that her sleep is not restful. Difficulty getting out of bed. She reports frequent rumination. "I obsess over things I can't fix or change and can't do anything about." Will have to get up and check that things are turned off. Dreading going to get her hair done tomorrow and reports difficulty leaving the house. Reports that she also is not caring if her hair is done. "I feel exhausted from feeling and thinking." Low energy and motivation. She reports that she has not been eating and has not been losing weight and attributes this to inactivity. Denies excessive spending and reports that she has been spending less. Describes concentration as "non-existent." She reports anhedonia. She reports low self-esteem.  Brother was killed on Thanksgiving day many years ago. Holidays have been difficult. Friend coming to visit Thursday and they are planning to go to a concert.   Continues to see Beckey Downing, [facility].   Past Psychiatric Medication  Trials: (Reports sensitivity to medication) Lamictal- reports that 150 mg felt that this was too high. Lithium- Ineffective Paxil- Took years ago in combination with other medications Lexapro-ineffective Zoloft- ineffective Prozac- Has tremor and unsteadiness on 80 mg. Has taken about 5 years. Unsure of benefit. Effexor XR- adverse reaction. Confusion. Cymbalta- Ineffective. Remeron- Wt gain. Trintellix- GI side effects. Some partial improvement., Itching on palms Wellbutrin- rashduring clinical trials. Gabapentin- felt unsteady. Suppressed breathing. Lyrica-Suppressed breathing. Temazepam Xanax Abilify-Ineffective. Risperdal- wt gain Rexulti- Akathisia Latuda Viibryd- Reports that Dr. Toy Care has told her that she was on it and she does not recall this. Propranolol- felt "revved up."  GAD-7     Office Visit from 04/05/2020 in [place] Visit from 02/21/2020 in [facility]  Total GAD-7 Score 15 12    PHQ2-9     Clinical Support from 06/26/2020 in [place] Office Visit from 04/05/2020 in [place] Video Visit from 02/23/2020 in [place] Office Visit from 02/21/2020 in [place] Visit from 07/30/2019 in [place]  PHQ-2 Total Score 3 6 6 6  0  PHQ-9 Total Score 9 27 6 20  0       Review of Systems:  Review of Systems  Gastrointestinal: Positive for nausea.  Musculoskeletal: Negative for gait problem.       Ankle pain and reports that surgery has been recommended to repair hardware.   Neurological: Negative for tremors.  Psychiatric/Behavioral:       Please  refer to HPI    Medications: I have reviewed the patient's current medications.  Current Outpatient Medications  Medication Sig Dispense Refill   lisinopril (ZESTRIL) 20 MG tablet Take 1 tablet (20 mg  total) by mouth daily. 90 tablet 3   lurasidone 60 MG TABS Take 0.5 tablets (30 mg total) by mouth daily with supper. 21 tablet 0   temazepam (RESTORIL) 30 MG capsule Take 1 capsule (30 mg total) by mouth at bedtime. 90 capsule 0   ALPRAZolam (XANAX) 1 MG tablet Take 1 tablet (1 mg total) by mouth 4 (four) times daily as needed for anxiety. 360 tablet 0   amLODipine (NORVASC) 5 MG tablet TAKE 1/2 TABLET BY MOUTH IN THE MORNING AND 1 TABLET AT BEDTIME 135 tablet 3   butalbital-aspirin-caffeine-codeine (FIORINAL WITH CODEINE) 50-325-40-30 MG capsule Take 1 capsule by mouth every 6 (six) hours as needed for pain. 21 capsule 0   cetirizine (ZYRTEC) 10 MG tablet Take 10 mg by mouth daily.     Cholecalciferol (VITAMIN D) 50 MCG (2000 UT) CAPS Take by mouth. (Patient not taking: Reported on 06/23/2020)     cyanocobalamin (,VITAMIN B-12,) 1000 MCG/ML injection Inject 1 mL into the skin once a week for 4 weeks, then once a month for 5 months. 1 mL 8   fenofibrate 160 MG tablet Take 1 tablet (160 mg total) by mouth daily. 90 tablet 0   fluticasone (FLONASE) 50 MCG/ACT nasal spray Place 1 spray into both nostrils daily.     hydrALAZINE (APRESOLINE) 25 MG tablet Take 1 tablet (25 mg total) by mouth in the morning and at bedtime. 60 tablet 3   lamoTRIgine (LAMICTAL) 100 MG tablet Take 1 tablet (100 mg total) by mouth daily. 90 tablet 0   mycophenolate (CELLCEPT) 500 MG tablet Take 500 mg by mouth 2 (two) times daily.      ondansetron (ZOFRAN-ODT) 4 MG disintegrating tablet Take 4 mg by mouth every 8 (eight) hours as needed. (Patient not taking: Reported on 06/26/2020)     OXYGEN Inhale 2 L into the lungs as needed (SOB).     pantoprazole (PROTONIX) 40 MG tablet TAKE ONE TABLET BY MOUTH TWICE A DAY (Patient taking differently: Take 40 mg by mouth daily. ) 180 tablet 1   PARoxetine (PAXIL) 20 MG tablet Take 1/2 tablet daily for one week, then increase to 1 tablet daily 30 tablet 1   pravastatin  (PRAVACHOL) 40 MG tablet Take 1 tablet (40 mg total) by mouth daily. 90 tablet 3   predniSONE (DELTASONE) 2.5 MG tablet Take 0.25 mg by mouth daily with breakfast.   11   pregabalin (LYRICA) 75 MG capsule Take 1 capsule (75 mg total) by mouth 2 (two) times daily. 60 capsule 1   promethazine (PHENERGAN) 25 MG tablet TAKE ONE TABLET BY MOUTH EVERY 8 HOURS AS NEEDED FOR FOR NAUSEA AND VOMITING 30 tablet 0   SYRINGE-NEEDLE, DISP, 3 ML (B-D 3CC LUER-LOK SYR 25GX1") 25G X 1" 3 ML MISC USE TO ADMINSTER B12 INJECTIONS 1 TIME A WEEK FOR 4 WEEKS THEN ONCE MONTHLY FOR 5 MONTH 10 each 0   traMADol (ULTRAM) 50 MG tablet Take 50 mg by mouth every 8 (eight) hours as needed. (Patient not taking: Reported on 03/09/2020)     TURMERIC PO Take by mouth.     Vitamin D, Ergocalciferol, (DRISDOL) 1.25 MG (50000 UNIT) CAPS capsule Take 1 capsule (50,000 Units total) by mouth every 7 (seven) days. 12 capsule 0   No  current facility-administered medications for this visit.    Medication Side Effects: Headache, Nausea and Other: Restlessness  Allergies:  Allergies  Allergen Reactions   Lisinopril     COUGH    Trintellix [Vortioxetine]    Macrobid [Nitrofurantoin Monohyd Macro] Diarrhea    Diarrhea, GI upset   Sulfa Antibiotics Rash    Past Medical History:  Diagnosis Date   Allergy    Anxiety    Arthritis    KNEES,BACK,HIPS   Depression    Fibromyalgia    GERD (gastroesophageal reflux disease)    Hyperlipidemia    Hypertension    IBS (irritable bowel syndrome)    Interstitial lung disease (HCC)    Pulmonary embolism ([place]) 09/2016   provoked- s/p rotator cuff repair   Sleep apnea     Family History  Problem Relation Age of Onset   Diabetes Mother    Heart disease Mother    Anxiety disorder Mother    Depression Mother    Kidney disease Mother    Diabetes Father    Heart disease Father    Anxiety disorder Father    Depression Father    Diabetes Paternal  Grandmother    Cystic fibrosis Son    Alcohol abuse Son    Drug abuse Son    Anxiety disorder Son    Depression Son    Breast cancer Maternal Aunt    Breast cancer Maternal Aunt     Social History   Socioeconomic History   Marital status: Widowed    Spouse name: Not on file   Number of children: 2   Years of education: 12   Highest education level: High school graduate  Occupational History   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   Vaping Use   Vaping Use: Never used  Substance and Sexual Activity   Alcohol use: No    Alcohol/week: 0.0 standard drinks   Drug use: No   Sexual activity: Not on file  Other Topics Concern   Not on file  Social History Narrative   Lives alone, widowed. Has one dtr. in [place]. [place] and one son in [place]./fim   Social Determinants of Health   Financial Resource Strain: Low Risk    Difficulty of Paying Living Expenses: Not hard at all  Food Insecurity: No Food Insecurity   Worried About Charity fundraiser in the Last Year: Never true   Arboriculturist in the Last Year: Never true  Transportation Needs: No Transportation Needs   Lack of Transportation (Medical): No   Lack of Transportation (Non-Medical): No  Physical Activity: Insufficiently Active   Days of Exercise per Week: 4 days   Minutes of Exercise per Session: 20 min  Stress: No Stress Concern Present   Feeling of Stress : Only a little  Social Connections: Socially Isolated   Frequency of Communication with Friends and Family: More than three times a week   Frequency of Social Gatherings with Friends and Family: Never   Attends Religious Services: Never   Marine scientist or Organizations: No   Attends Archivist Meetings: Never   Marital Status: Widowed  Intimate  Partner Violence: Not At Risk   Fear of Current or Ex-Partner: No   Emotionally Abused: No   Physically Abused: No   Sexually  Abused: No    Past Medical History, Surgical history, Social history, and Family history were reviewed and updated as appropriate.   Please see review of systems for further details on the patient's review from today.   Objective:   Physical Exam:  There were no vitals taken for this visit.  Physical Exam Constitutional:      General: She is not in acute distress. Musculoskeletal:        General: No deformity.  Neurological:     Mental Status: She is alert and oriented to person, place, and time.     Coordination: Coordination normal.  Psychiatric:        Attention and Perception: Attention and perception normal. She does not perceive auditory or visual hallucinations.        Mood and Affect: Mood is anxious and depressed. Affect is tearful. Affect is not labile, blunt, angry or inappropriate.        Speech: Speech normal.        Behavior: Behavior normal.        Thought Content: Thought content normal. Thought content is not paranoid or delusional. Thought content does not include homicidal or suicidal ideation. Thought content does not include homicidal or suicidal plan.        Cognition and Memory: Cognition and memory normal.        Judgment: Judgment normal.     Comments: Insight intact     Lab Review:     Component Value Date/Time   NA 138 07/14/2020 1020   K 4.1 07/14/2020 1020   CL 99 07/14/2020 1020   CO2 19 (L) 07/14/2020 1020   GLUCOSE 95 07/14/2020 1020   GLUCOSE 121 (H) 05/17/2020 1146   BUN 13 07/14/2020 1020   CREATININE 0.80 07/14/2020 1020   CALCIUM 9.6 07/14/2020 1020   PROT 7.1 06/14/2019 1509   ALBUMIN 4.6 06/14/2019 1509   AST 10 06/14/2019 1509   ALT 9 06/14/2019 1509   ALKPHOS 65 06/14/2019 1509   BILITOT 0.5 06/14/2019 1509   GFRNONAA 77 07/14/2020 1020   GFRAA 89 07/14/2020 1020       Component Value  Date/Time   WBC 9.3 05/17/2020 1146   RBC 4.79 05/17/2020 1146   HGB 12.5 05/17/2020 1146   HCT 39.7 05/17/2020 1146   PLT 353 05/17/2020 1146   MCV 82.9 05/17/2020 1146   MCH 26.1 05/17/2020 1146   MCHC 31.5 05/17/2020 1146   RDW 13.4 05/17/2020 1146   LYMPHSABS 1.6 06/14/2019 1509   MONOABS 0.4 06/14/2019 1509   EOSABS 0.2 06/14/2019 1509   BASOSABS 0.1 06/14/2019 1509    No results found for: POCLITH, LITHIUM   No results found for: PHENYTOIN, PHENOBARB, VALPROATE, CBMZ   .res Assessment: Plan:   Discussed decreasing Latuda to 30 mg daily with evening meal since higher doses seem to have caused restlessness and insomnia without any significant improvement in mood.  Discussed that lower doses of Latuda can occasionally be more effective in terms of antidepressant response. Discussed potential benefits, risks, and side effects of Paxil for depression and anxiety.  Patient agrees to trial of Paxil. Will start Paxil 20 mg 1/2 tablet daily for 1 week, then increase to 1 tablet daily. Continue lamotrigine 100 mg daily for mood signs and symptoms Continue alprazolam for anxiety. Continue temazepam 30 mg at bedtime for insomnia. Patient to follow-up in 3 weeks or sooner if clinically indicated. Patient advised to contact office with any questions, adverse effects, or acute worsening  in signs and symptoms.   Ruweyda was seen today for depression, anxiety and insomnia.  Diagnoses and all orders for this visit:  Generalized anxiety disorder -     PARoxetine (PAXIL) 20 MG tablet; Take 1/2 tablet daily for one week, then increase to 1 tablet daily -     ALPRAZolam (XANAX) 1 MG tablet; Take 1 tablet (1 mg total) by mouth 4 (four) times daily as needed for anxiety.  Severe episode of recurrent major depressive disorder, without psychotic features ([place]) -     PARoxetine (PAXIL) 20 MG tablet; Take 1/2 tablet daily for one week, then increase to 1 tablet daily -     lamoTRIgine (LAMICTAL)  100 MG tablet; Take 1 tablet (100 mg total) by mouth daily.  Other orders -     lurasidone 60 MG TABS; Take 0.5 tablets (30 mg total) by mouth daily with supper.     Please see After Visit Summary for patient specific instructions.  Future Appointments  Date Time Provider [place]  08/31/2020  3:00 PM Thayer Headings, PMHNP [facility] None  09/19/2020  3:50 PM [facility] MM 2 [facility]  09/22/2020  9:40 AM Skeet Latch, MD [facility]    No orders of the defined types were placed in this encounter.   -------------------------------

## 2020-08-16 ENCOUNTER — Other Ambulatory Visit: Payer: Self-pay

## 2020-08-16 ENCOUNTER — Encounter: Payer: Self-pay | Admitting: Family Medicine

## 2020-08-16 MED ORDER — "BD LUER-LOK SYRINGE 25G X 1"" 3 ML MISC": 0 refills | Status: AC

## 2020-08-16 MED ORDER — VITAMIN D (ERGOCALCIFEROL) 1.25 MG (50000 UNIT) PO CAPS
50000.0000 [IU] | ORAL_CAPSULE | ORAL | 0 refills | Status: DC
Start: 2020-08-16 — End: 2020-10-09

## 2020-08-21 ENCOUNTER — Telehealth: Payer: Medicare HMO | Admitting: Psychiatry

## 2020-08-24 DIAGNOSIS — G4733 Obstructive sleep apnea (adult) (pediatric): Secondary | ICD-10-CM | POA: Diagnosis not present

## 2020-08-31 ENCOUNTER — Ambulatory Visit: Payer: Medicare HMO | Admitting: Psychiatry

## 2020-09-01 ENCOUNTER — Ambulatory Visit (INDEPENDENT_AMBULATORY_CARE_PROVIDER_SITE_OTHER): Payer: Medicare HMO | Admitting: Psychiatry

## 2020-09-01 ENCOUNTER — Other Ambulatory Visit: Payer: Self-pay

## 2020-09-01 ENCOUNTER — Encounter: Payer: Self-pay | Admitting: Psychiatry

## 2020-09-01 DIAGNOSIS — F330 Major depressive disorder, recurrent, mild: Secondary | ICD-10-CM | POA: Diagnosis not present

## 2020-09-01 DIAGNOSIS — F5101 Primary insomnia: Secondary | ICD-10-CM

## 2020-09-01 DIAGNOSIS — F411 Generalized anxiety disorder: Secondary | ICD-10-CM | POA: Diagnosis not present

## 2020-09-01 DIAGNOSIS — R69 Illness, unspecified: Secondary | ICD-10-CM | POA: Diagnosis not present

## 2020-09-01 MED ORDER — PAROXETINE HCL 20 MG PO TABS: 20.0000 mg | ORAL_TABLET | Freq: Every day | ORAL | 1 refills | Status: DC

## 2020-09-01 NOTE — Progress Notes (Signed)
Stacey Alvarez [id number] 06-Sep-1956 64 y.o.  Subjective:   Patient ID:  Stacey Alvarez is a 64 y.o. (DOB Aug 03, 1956) female.  Chief Complaint:  Chief Complaint  Patient presents with  . Follow-up    Anxiety, depression, sleep disturbance    HPI Stacey Alvarez presents to the office today for follow-up of depression, anxiety, and sleep disturbance. She reports, "I think the Paxil has been a big change." She reports, "I've had more get up and go during the day."She reports that she has been wrapping Christmas. She reports that she is not crying as much. She reports that she is no longer having uncontrollable sobbing. She reports that now crying is related to grief. She reports that sadness is "better" and is having some sadness with the holidays and grief. She reports that irritability has improved. She reports that anxiety seems to be somewhat better. Has anxiety about being in social situations where she does not know people, such as a holiday gathering in her neighborhood. Denies any recent panic. Worry has improved. She reports that she is having obsessive thoughts about the batteries being changed in her smoke detector after falling off a ladder.  She reports that she will feel tired and have difficulty drifting off to sleep and will "surface sleep" and feel as if she is not fully asleep. Sleeping 8-9 hours. Turning lights off earlier in the evening. Appetite has been low. She reports that her clothes are not fitting as tight. Motivation has improved in the morning and no longer dreads the day. She has been running more errands. Energy has improved some. Concentration has improved some.Some diminished enjoyment in things. Denies impulsivity or excessive spending. She reports that she has cut back at the holidays. Denies SI.   She reports that restlessness has resolved.   Was able to go to a concert recently. Will spend Christmas with her daughter.   Typically taking one Xanax in the morning and 2  tabs at night and has not been needing to take it mid-day as often.   Past Psychiatric Medication Trials: (Reports sensitivity to medication) Lamictal- reports that 150 mg felt that this was too high. Lithium- Ineffective Paxil- Took years ago in combination with other medications Lexapro-ineffective Zoloft- ineffective Prozac- Has tremor and unsteadiness on 80 mg. Has taken about 5 years. Unsure of benefit. Effexor XR- adverse reaction. Confusion. Cymbalta- Ineffective. Remeron- Wt gain. Trintellix- GI side effects. Some partial improvement., Itching on palms Wellbutrin- rashduring clinical trials. Gabapentin- felt unsteady. Suppressed breathing. Lyrica-Suppressed breathing. Temazepam Xanax Abilify-Ineffective. Risperdal- wt gain Rexulti- Akathisia Latuda Viibryd- Reports that Dr. Toy Care has told her that she was on it and she does not recall this. Propranolol- felt "revved up."  GAD-7   Flowsheet Row Office Visit from 04/05/2020 in [place] Visit from 02/21/2020 in [facility]  Total GAD-7 Score 15 12    PHQ2-9   Flowsheet Row Clinical Support from 06/26/2020 in [place] Office Visit from 04/05/2020 in [place] Video Visit from 02/23/2020 in [place] Office Visit from 02/21/2020 in [place] Visit from 07/30/2019 in [place]  PHQ-2 Total Score 3 6 6 6  0  PHQ-9 Total Score 9 27 6 20  0       Review of Systems:  Review of Systems  Musculoskeletal: Negative for gait problem.       Ankle pain has improved  Neurological: Negative for tremors.  Psychiatric/Behavioral:  Please refer to HPI    Medications: I have reviewed the patient's current medications.  Current Outpatient Medications  Medication Sig Dispense Refill  . ALPRAZolam (XANAX) 1 MG  tablet Take 1 tablet (1 mg total) by mouth 4 (four) times daily as needed for anxiety. 360 tablet 0  . amLODipine (NORVASC) 5 MG tablet TAKE 1/2 TABLET BY MOUTH IN THE MORNING AND 1 TABLET AT BEDTIME 135 tablet 3  . butalbital-aspirin-caffeine-codeine (FIORINAL WITH CODEINE) 50-325-40-30 MG capsule Take 1 capsule by mouth every 6 (six) hours as needed for pain. 21 capsule 0  . cetirizine (ZYRTEC) 10 MG tablet Take 10 mg by mouth daily.    . fenofibrate 160 MG tablet Take 1 tablet (160 mg total) by mouth daily. 90 tablet 0  . fluticasone (FLONASE) 50 MCG/ACT nasal spray Place 1 spray into both nostrils daily.    Marland Kitchen lamoTRIgine (LAMICTAL) 100 MG tablet Take 1 tablet (100 mg total) by mouth daily. 90 tablet 0  . lisinopril (ZESTRIL) 20 MG tablet Take 1 tablet (20 mg total) by mouth daily. 90 tablet 3  . lurasidone 60 MG TABS Take 0.5 tablets (30 mg total) by mouth daily with supper. 21 tablet 0  . mycophenolate (CELLCEPT) 500 MG tablet Take 500 mg by mouth 2 (two) times daily.     . pravastatin (PRAVACHOL) 40 MG tablet Take 1 tablet (40 mg total) by mouth daily. 90 tablet 3  . predniSONE (DELTASONE) 2.5 MG tablet Take 2.5 mg by mouth daily with breakfast.  11  . temazepam (RESTORIL) 30 MG capsule Take 1 capsule (30 mg total) by mouth at bedtime. 90 capsule 0  . Vitamin D, Ergocalciferol, (DRISDOL) 1.25 MG (50000 UNIT) CAPS capsule Take 1 capsule (50,000 Units total) by mouth every 7 (seven) days. 12 capsule 0  . Cholecalciferol (VITAMIN D) 50 MCG (2000 UT) CAPS Take by mouth. (Patient not taking: Reported on 06/23/2020)    . cyanocobalamin (,VITAMIN B-12,) 1000 MCG/ML injection Inject 1 mL into the skin once a week for 4 weeks, then once a month for 5 months. 1 mL 8  . hydrALAZINE (APRESOLINE) 25 MG tablet Take 1 tablet (25 mg total) by mouth in the morning and at bedtime. 60 tablet 3  . ondansetron (ZOFRAN-ODT) 4 MG disintegrating tablet Take 4 mg by mouth every 8 (eight) hours as needed. (Patient not  taking: Reported on 06/26/2020)    . OXYGEN Inhale 2 L into the lungs as needed (SOB).    . pantoprazole (PROTONIX) 40 MG tablet TAKE ONE TABLET BY MOUTH TWICE A DAY (Patient taking differently: Take 40 mg by mouth daily. ) 180 tablet 1  . PARoxetine (PAXIL) 20 MG tablet Take 1 tablet (20 mg total) by mouth daily. 30 tablet 1  . pregabalin (LYRICA) 75 MG capsule Take 1 capsule (75 mg total) by mouth 2 (two) times daily. 60 capsule 1  . promethazine (PHENERGAN) 25 MG tablet TAKE ONE TABLET BY MOUTH EVERY 8 HOURS AS NEEDED FOR FOR NAUSEA AND VOMITING 30 tablet 0  . SYRINGE-NEEDLE, DISP, 3 ML (B-D 3CC LUER-LOK SYR 25GX1") 25G X 1" 3 ML MISC USE TO ADMINSTER B12 INJECTIONS 1 TIME A WEEK FOR 4 WEEKS THEN ONCE MONTHLY FOR 5 MONTH 10 each 0  . traMADol (ULTRAM) 50 MG tablet Take 50 mg by mouth every 8 (eight) hours as needed. (Patient not taking: Reported on 03/09/2020)    . TURMERIC PO Take by mouth. (Patient not taking: Reported on 09/01/2020)  No current facility-administered medications for this visit.    Medication Side Effects: None  Possible very brief "flash" of HA after taking latuda   Allergies:  Allergies  Allergen Reactions  . Lisinopril     COUGH   . Trintellix [Vortioxetine]   . Macrobid [Nitrofurantoin Monohyd Macro] Diarrhea    Diarrhea, GI upset  . Sulfa Antibiotics Rash    Past Medical History:  Diagnosis Date  . Allergy   . Anxiety   . Arthritis    KNEES,BACK,HIPS  . Depression   . Fibromyalgia   . GERD (gastroesophageal reflux disease)   . Hyperlipidemia   . Hypertension   . IBS (irritable bowel syndrome)   . Interstitial lung disease ([place])   . Pulmonary embolism ([place]) 09/2016   provoked- s/p rotator cuff repair  . Sleep apnea     Family History  Problem Relation Age of Onset  . Diabetes Mother   . Heart disease Mother   . Anxiety disorder Mother   . Depression Mother   . Kidney disease Mother   . Diabetes Father   . Heart disease Father   . Anxiety  disorder Father   . Depression Father   . Diabetes Paternal Grandmother   . Cystic fibrosis Son   . Alcohol abuse Son   . Drug abuse Son   . Anxiety disorder Son   . Depression Son   . Breast cancer Maternal Aunt   . Breast cancer Maternal Aunt     Social History   Socioeconomic History  . Marital status: Widowed    Spouse name: Not on file  . Number of children: 2  . Years of education: 21  . Highest education level: High school graduate  Occupational History  . 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 Smoking status: Former Research scientist (life sciences)  . Smokeless tobacco: Never Used  . Tobacco comment: smoked 2 years in late teens about 8-10 cigs daily.   Vaping Use  . Vaping Use: Never used  Substance and Sexual Activity  . Alcohol use: No    Alcohol/week: 0.0 standard drinks  . Drug use: No  . Sexual activity: Not on file  Other Topics Concern  . Not on file  Social History Narrative   Lives alone, widowed. Has one dtr. in [place]. [place] and one son in [place]   Social Determinants of Health   Financial Resource Strain: Low Risk   . Difficulty of Paying Living Expenses: Not hard at all  Food Insecurity: No Food Insecurity  . Worried About Charity fundraiser in the Last Year: Never true  . Ran Out of Food in the Last Year: Never true  Transportation Needs: No Transportation Needs  . Lack of Transportation (Medical): No  . Lack of Transportation (Non-Medical): No  Physical Activity: Insufficiently Active  . Days of Exercise per Week: 4 days  . Minutes of Exercise per Session: 20 min  Stress: No Stress Concern Present  . Feeling of Stress : Only a little  Social Connections: Socially Isolated  . Frequency of Communication with Friends and Family: More than three times a week  . Frequency of Social Gatherings with Friends and Family: Never  . Attends Religious Services: Never  . Active Member of Clubs or Organizations: No  . Attends Theatre manager Meetings: Never  . Marital Status: Widowed  Intimate Partner Violence: Not At Risk  . Fear of Current or Ex-Partner: No  . Emotionally Abused: No  .  Physically Abused: No  . Sexually Abused: No    Past Medical History, Surgical history, Social history, and Family history were reviewed and updated as appropriate.   Please see review of systems for further details on the patient's review from today.   Objective:   Physical Exam:  There were no vitals taken for this visit.  Physical Exam Constitutional:      General: She is not in acute distress. Musculoskeletal:        General: No deformity.  Neurological:     Mental Status: She is alert and oriented to person, place, and time.     Coordination: Coordination normal.  Psychiatric:        Attention and Perception: Attention and perception normal. She does not perceive auditory or visual hallucinations.        Mood and Affect: Affect is not labile, blunt, angry or inappropriate.        Speech: Speech normal.        Behavior: Behavior normal.        Thought Content: Thought content normal. Thought content is not paranoid or delusional. Thought content does not include homicidal or suicidal ideation. Thought content does not include homicidal or suicidal plan.        Cognition and Memory: Cognition and memory normal.        Judgment: Judgment normal.     Comments: Insight intact Mood and anxiety are improved compared to last exam     Lab Review:     Component Value Date/Time   NA 138 07/14/2020 1020   K 4.1 07/14/2020 1020   CL 99 07/14/2020 1020   CO2 19 (L) 07/14/2020 1020   GLUCOSE 95 07/14/2020 1020   GLUCOSE 121 (H) 05/17/2020 1146   BUN 13 07/14/2020 1020   CREATININE 0.80 07/14/2020 1020   CALCIUM 9.6 07/14/2020 1020   PROT 7.1 06/14/2019 1509   ALBUMIN 4.6 06/14/2019 1509   AST 10 06/14/2019 1509   ALT 9 06/14/2019 1509   ALKPHOS 65 06/14/2019 1509   BILITOT 0.5 06/14/2019 1509   GFRNONAA 77  07/14/2020 1020   GFRAA 89 07/14/2020 1020       Component Value Date/Time   WBC 9.3 05/17/2020 1146   RBC 4.79 05/17/2020 1146   HGB 12.5 05/17/2020 1146   HCT 39.7 05/17/2020 1146   PLT 353 05/17/2020 1146   MCV 82.9 05/17/2020 1146   MCH 26.1 05/17/2020 1146   MCHC 31.5 05/17/2020 1146   RDW 13.4 05/17/2020 1146   LYMPHSABS 1.6 06/14/2019 1509   MONOABS 0.4 06/14/2019 1509   EOSABS 0.2 06/14/2019 1509   BASOSABS 0.1 06/14/2019 1509    No results found for: POCLITH, LITHIUM   No results found for: PHENYTOIN, PHENOBARB, VALPROATE, CBMZ   .res Assessment: Plan:    Pt seen for 30 minutes and time spent counseling pt regarding treatment plan. Discussed continuing Paxil 20 mg po qd for mood and anxiety since she has experienced improved mood and anxiety s/s since recent initiation of Paxil. Discussed that she is likely to continue to experience improvement in mood and anxiety s/s since she has not been on full dose for 4 weeks yet. Will therefore continue current medications without changes.  Continue Paxil 20 mg po qd for depression and anxiety.  Continue Latuda 30 mg po qd with a meal for mood.  Continue Xanax 1 mg four times daily as needed for anxiety.  Continue lamictal 100 mg po qd for depression. Continue Temazepam  30 mg po QHS for insomnia.  Recommend continuing therapy with Beckey Downing, [facility].  Pt to follow-up with this provider in 1 month or sooner if clinically indicated.  Patient advised to contact office with any questions, adverse effects, or acute worsening in signs and symptoms.   Stacey Alvarez was seen today for follow-up.  Diagnoses and all orders for this visit:  Mild episode of recurrent major depressive disorder (HCC) -     PARoxetine (PAXIL) 20 MG tablet; Take 1 tablet (20 mg total) by mouth daily.  Generalized anxiety disorder -     PARoxetine (PAXIL) 20 MG tablet; Take 1 tablet (20 mg total) by mouth daily.  Primary insomnia     Please see After Visit  Summary for patient specific instructions.  Future Appointments  Date Time Provider [place]  09/14/2020 10:30 AM Midge Minium, MD [facility]  09/19/2020  3:50 PM [facility] MM 2 [facility]  09/22/2020  9:40 AM Skeet Latch, MD [facility]  10/03/2020  3:00 PM Thayer Headings, PMHNP CP-CP None    No orders of the defined types were placed in this encounter.   -------------------------------

## 2020-09-03 DIAGNOSIS — J849 Interstitial pulmonary disease, unspecified: Secondary | ICD-10-CM | POA: Diagnosis not present

## 2020-09-03 DIAGNOSIS — G4733 Obstructive sleep apnea (adult) (pediatric): Secondary | ICD-10-CM | POA: Diagnosis not present

## 2020-09-03 DIAGNOSIS — R062 Wheezing: Secondary | ICD-10-CM | POA: Diagnosis not present

## 2020-09-11 NOTE — Unmapped (Signed)
[facility] Refill Coordination Note    Specialty Medication(s) to be Shipped:   General Specialty: mycophenolate 500mg     Other medication(s) to be shipped: No additional medications requested for fill at this time     [name], DOB: [birth year]  Phone: [phone] (home)       All above HIPAA information was verified with patient.      Was a Nurse, learning disability used for this call? No    Completed refill call assessment today to schedule patient's medication shipment from the [facility] [phone]).       Specialty medication(s) and dose(s) confirmed: Regimen is correct and unchanged.   Changes to medications: [name] reports no changes at this time.  Changes to insurance: No  Questions for the pharmacist: No    Confirmed patient received Welcome Packet with first shipment. The patient will receive a drug information handout for each medication shipped and additional FDA Medication Guides as required.       DISEASE/MEDICATION-SPECIFIC INFORMATION        N/A    SPECIALTY MEDICATION ADHERENCE     Medication Adherence    Patient reported X missed doses in the last month: 0  Specialty Medication: Mycophenolate 500mg   Patient is on additional specialty medications: No  Patient is on more than two specialty medications: No                Mycophenolate 500 mg: 4-5 days of medicine on hand       SHIPPING     Shipping address confirmed in Epic.     Delivery Scheduled: Yes, Expected medication delivery date: 09/13/20.     Medication will be delivered via UPS to the prescription address in Epic WAM.    Nancy Nordmann [facility] Specialty Technician

## 2020-09-12 MED FILL — MYCOPHENOLATE MOFETIL 500 MG TABLET: ORAL | 30 days supply | Qty: 60 | Fill #6

## 2020-09-12 MED FILL — MYCOPHENOLATE MOFETIL 500 MG TABLET: 30 days supply | Qty: 60 | Fill #6 | Status: AC

## 2020-09-14 ENCOUNTER — Ambulatory Visit: Payer: Medicare HMO | Admitting: Family Medicine

## 2020-09-19 ENCOUNTER — Ambulatory Visit: Payer: Medicare HMO

## 2020-09-22 ENCOUNTER — Telehealth: Payer: Medicare HMO | Admitting: Cardiovascular Disease

## 2020-09-24 DIAGNOSIS — G4733 Obstructive sleep apnea (adult) (pediatric): Secondary | ICD-10-CM | POA: Diagnosis not present

## 2020-09-26 ENCOUNTER — Other Ambulatory Visit: Payer: Self-pay | Admitting: Psychiatry

## 2020-09-26 ENCOUNTER — Other Ambulatory Visit: Payer: Self-pay | Admitting: Family Medicine

## 2020-09-26 DIAGNOSIS — F330 Major depressive disorder, recurrent, mild: Secondary | ICD-10-CM

## 2020-09-26 DIAGNOSIS — F411 Generalized anxiety disorder: Secondary | ICD-10-CM

## 2020-09-28 ENCOUNTER — Other Ambulatory Visit: Payer: Self-pay | Admitting: Cardiovascular Disease

## 2020-09-28 ENCOUNTER — Encounter: Payer: Self-pay | Admitting: *Deleted

## 2020-09-28 ENCOUNTER — Other Ambulatory Visit: Payer: Self-pay | Admitting: Psychiatry

## 2020-09-28 ENCOUNTER — Other Ambulatory Visit: Payer: Self-pay | Admitting: Family Medicine

## 2020-09-28 DIAGNOSIS — F332 Major depressive disorder, recurrent severe without psychotic features: Secondary | ICD-10-CM

## 2020-10-03 ENCOUNTER — Telehealth (INDEPENDENT_AMBULATORY_CARE_PROVIDER_SITE_OTHER): Payer: Medicare HMO | Admitting: Psychiatry

## 2020-10-03 ENCOUNTER — Encounter: Payer: Self-pay | Admitting: Psychiatry

## 2020-10-03 DIAGNOSIS — R69 Illness, unspecified: Secondary | ICD-10-CM | POA: Diagnosis not present

## 2020-10-03 DIAGNOSIS — F330 Major depressive disorder, recurrent, mild: Secondary | ICD-10-CM | POA: Diagnosis not present

## 2020-10-03 DIAGNOSIS — F5101 Primary insomnia: Secondary | ICD-10-CM

## 2020-10-03 DIAGNOSIS — F411 Generalized anxiety disorder: Secondary | ICD-10-CM | POA: Diagnosis not present

## 2020-10-03 MED ORDER — ALPRAZOLAM 1 MG PO TABS: 1.0000 mg | ORAL_TABLET | Freq: Four times a day (QID) | ORAL | 0 refills | Status: DC | PRN

## 2020-10-03 MED ORDER — LATUDA 20 MG PO TABS: 20.0000 mg | ORAL_TABLET | Freq: Every day | ORAL | 0 refills | Status: DC

## 2020-10-03 MED ORDER — PAROXETINE HCL 30 MG PO TABS: 30.0000 mg | ORAL_TABLET | Freq: Every day | ORAL | 1 refills | Status: DC

## 2020-10-03 MED ORDER — TEMAZEPAM 30 MG PO CAPS: 30.0000 mg | ORAL_CAPSULE | Freq: Every day | ORAL | 0 refills | Status: DC

## 2020-10-03 NOTE — Progress Notes (Signed)
Stacey Alvarez [id number] 1956-07-23 65 y.o.  Virtual Visit via Video Note  I connected with pt @ on 10/03/20 at  3:00 PM EST by a video enabled telemedicine application and verified that I am speaking with the correct person using two identifiers.   I discussed the limitations of evaluation and management by telemedicine and the availability of in person appointments. The patient expressed understanding and agreed to proceed.  I discussed the assessment and treatment plan with the patient. The patient was provided an opportunity to ask questions and all were answered. The patient agreed with the plan and demonstrated an understanding of the instructions.   The patient was advised to call back or seek an in-person evaluation if the symptoms worsen or if the condition fails to improve as anticipated.  I provided 30 minutes of non-face-to-face time during this encounter.  The patient was located at home.  The provider was located at [place].   Thayer Headings, PMHNP   Subjective:   Patient ID:  Stacey Alvarez is a 65 y.o. (DOB [birth year]) female.  Chief Complaint:  Chief Complaint  Patient presents with  . Insomnia  . Follow-up    Depression, anxiety    HPI Stacey Alvarez presents for follow-up of depression, anxiety, and insomnia. She "reports I'm feeling ok emotionally." She reports that Paxil seems to be helping. She reports that she has been crying less, "thinking more rationally," and feels "calmer." She continues to sadness related to grief and is better able to redirect these thoughts to something more positive or a positive memory.   She reports difficulty getting to sleep and staying asleep. Slept 5 hours last night and this has been consistent with other nights. She feels restless when she initially tries to go to bed. She reports that Tylenol PM does not seem to help with sleep. She reports that anxious thoughts have decreased. She reports continued rumination- "it's  not as severe as it used to be." Denies any recent panic s/s. Her energy has been better "but still not where I would like for it to be." She reports that she procrastinate. Motivation has improved some. Has enjoyed visits with her daughter. Concentration has improved and is not as good as she would like. She reports that her appetite has been decreased. She reports that she is trying to eat healthier. She reports that weight gain has been steady. Denies SI.   Avoiding caffeine later in the day.  Typically takes Xanax 3 tabs daily.  Past Psychiatric Medication Trials: (Reports sensitivity to medication) Lamictal- reports that 150 mg felt that this was too high. Lithium- Ineffective Paxil- Took years ago in combination with other medications Lexapro-ineffective Zoloft- ineffective Prozac- Has tremor and unsteadiness on 80 mg. Has taken about 5 years. Unsure of benefit. Effexor XR- adverse reaction. Confusion. Cymbalta- Ineffective. Remeron- Wt gain. Trintellix- GI side effects. Some partial improvement., Itching on palms Wellbutrin- rashduring clinical trials. Gabapentin- felt unsteady. Suppressed breathing. Lyrica-Suppressed breathing. Temazepam Xanax Abilify-Ineffective. Risperdal- wt gain Rexulti- Akathisia Latuda Viibryd- Reports that Dr. Toy Care has told her that she was on it and she does not recall this. Propranolol- felt "revved up."  Review of Systems:  Review of Systems  Gastrointestinal: Positive for abdominal pain.  Musculoskeletal: Negative for gait problem.  Neurological: Negative for tremors.  Psychiatric/Behavioral:       Please refer to HPI    Medications: I have reviewed the patient's current medications.  Current Outpatient Medications  Medication Sig Dispense Refill  . amLODipine (  NORVASC) 5 MG tablet TAKE 1/2 TABLET BY MOUTH IN THE MORNING AND 1 TABLET AT BEDTIME (Patient taking differently: TAKES 1 TABLET AT BEDTIME) 135 tablet 3  .  butalbital-aspirin-caffeine-codeine (FIORINAL WITH CODEINE) 50-325-40-30 MG capsule Take 1 capsule by mouth every 6 (six) hours as needed for pain. 21 capsule 0  . cetirizine (ZYRTEC) 10 MG tablet Take 10 mg by mouth daily.    . fenofibrate 160 MG tablet TAKE ONE TABLET BY MOUTH DAILY 90 tablet 0  . fluticasone (FLONASE) 50 MCG/ACT nasal spray Place 1 spray into both nostrils daily.    Marland Kitchen. lamoTRIgine (LAMICTAL) 100 MG tablet TAKE ONE TABLET BY MOUTH DAILY 60 tablet 1  . lisinopril (ZESTRIL) 20 MG tablet TAKE ONE TABLET BY MOUTH DAILY 90 tablet 0  . lurasidone (LATUDA) 20 MG TABS tablet Take 1 tablet (20 mg total) by mouth daily with lunch. 30 tablet 0  . mycophenolate (CELLCEPT) 500 MG tablet Take 500 mg by mouth 2 (two) times daily.     . pantoprazole (PROTONIX) 40 MG tablet TAKE ONE TABLET BY MOUTH TWICE A DAY (Patient taking differently: Take 40 mg by mouth daily.) 120 tablet 0  . pravastatin (PRAVACHOL) 40 MG tablet Take 1 tablet (40 mg total) by mouth daily. 90 tablet 3  . predniSONE (DELTASONE) 2.5 MG tablet Take 2.5 mg by mouth daily with breakfast.  11  . ALPRAZolam (XANAX) 1 MG tablet Take 1 tablet (1 mg total) by mouth 4 (four) times daily as needed for anxiety. 360 tablet 0  . Cholecalciferol (VITAMIN D) 50 MCG (2000 UT) CAPS Take by mouth.    . cyanocobalamin (,VITAMIN B-12,) 1000 MCG/ML injection Inject 1 mL into the skin once a week for 4 weeks, then once a month for 5 months. 1 mL 8  . hydrALAZINE (APRESOLINE) 25 MG tablet Take 1 tablet (25 mg total) by mouth in the morning and at bedtime. 60 tablet 3  . ondansetron (ZOFRAN-ODT) 4 MG disintegrating tablet Take 4 mg by mouth every 8 (eight) hours as needed. (Patient not taking: Reported on 06/26/2020)    . OXYGEN Inhale 2 L into the lungs as needed (SOB).    Marland Kitchen. PARoxetine (PAXIL) 30 MG tablet Take 1 tablet (30 mg total) by mouth daily. 30 tablet 1  . pregabalin (LYRICA) 75 MG capsule Take 1 capsule (75 mg total) by mouth 2 (two) times  daily. 60 capsule 1  . promethazine (PHENERGAN) 25 MG tablet TAKE ONE TABLET BY MOUTH EVERY 8 HOURS AS NEEDED FOR FOR NAUSEA AND VOMITING 30 tablet 0  . SYRINGE-NEEDLE, DISP, 3 ML (B-D 3CC LUER-LOK SYR 25GX1") 25G X 1" 3 ML MISC USE TO ADMINSTER B12 INJECTIONS 1 TIME A WEEK FOR 4 WEEKS THEN ONCE MONTHLY FOR 5 MONTH 10 each 0  . [START ON 10/16/2020] temazepam (RESTORIL) 30 MG capsule Take 1 capsule (30 mg total) by mouth at bedtime. 90 capsule 0  . traMADol (ULTRAM) 50 MG tablet Take 50 mg by mouth every 8 (eight) hours as needed. (Patient not taking: Reported on 03/09/2020)    . TURMERIC PO Take by mouth. (Patient not taking: Reported on 09/01/2020)    . Vitamin D, Ergocalciferol, (DRISDOL) 1.25 MG (50000 UNIT) CAPS capsule Take 1 capsule (50,000 Units total) by mouth every 7 (seven) days. (Patient not taking: Reported on 10/03/2020) 12 capsule 0   No current facility-administered medications for this visit.    Medication Side Effects: Sleep Problems  Denies involuntary movements  Allergies:  Allergies  Allergen Reactions  . Lisinopril     COUGH   . Trintellix [Vortioxetine]   . Macrobid [Nitrofurantoin Monohyd Macro] Diarrhea    Diarrhea, GI upset  . Sulfa Antibiotics Rash    Past Medical History:  Diagnosis Date  . Allergy   . Anxiety   . Arthritis    KNEES,BACK,HIPS  . Depression   . Fibromyalgia   . GERD (gastroesophageal reflux disease)   . Hyperlipidemia   . Hypertension   . IBS (irritable bowel syndrome)   . Interstitial lung disease ([place])   . Pulmonary embolism ([place]) 09/2016   provoked- s/p rotator cuff repair  . Sleep apnea     Family History  Problem Relation Age of Onset  . Diabetes Mother   . Heart disease Mother   . Anxiety disorder Mother   . Depression Mother   . Kidney disease Mother   . Diabetes Father   . Heart disease Father   . Anxiety disorder Father   . Depression Father   . Diabetes Paternal Grandmother   . Cystic fibrosis Son   . Alcohol  abuse Son   . Drug abuse Son   . Anxiety disorder Son   . Depression Son   . Breast cancer Maternal Aunt   . Breast cancer Maternal Aunt     Social History   Socioeconomic History  . Marital status: Widowed    Spouse name: Not on file  . Number of children: 2  . Years of education: 12  . Highest education level: High school graduate  Occupational History  . 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 Smoking status: Former Research scientist (life sciences)  . Smokeless tobacco: Never Used  . Tobacco comment: smoked 2 years in late teens about 8-10 cigs daily.   Vaping Use  . Vaping Use: Never used  Substance and Sexual Activity  . Alcohol use: No    Alcohol/week: 0.0 standard drinks  . Drug use: No  . Sexual activity: Not on file  Other Topics Concern  . Not on file  Social History Narrative   Lives alone, widowed. Has one dtr. in [place]. [place] and one son in [place]./fim   Social Determinants of Health   Financial Resource Strain: Low Risk   . Difficulty of Paying Living Expenses: Not hard at all  Food Insecurity: No Food Insecurity  . Worried About Charity fundraiser in the Last Year: Never true  . Ran Out of Food in the Last Year: Never true  Transportation Needs: No Transportation Needs  . Lack of Transportation (Medical): No  . Lack of Transportation (Non-Medical): No  Physical Activity: Insufficiently Active  . Days of Exercise per Week: 4 days  . Minutes of Exercise per Session: 20 min  Stress: No Stress Concern Present  . Feeling of Stress : Only a little  Social Connections: Socially Isolated  . Frequency of Communication with Friends and Family: More than three times a week  . Frequency of Social Gatherings with Friends and Family: Never  . Attends Religious Services: Never  . Active Member of Clubs or Organizations: No  . Attends Archivist Meetings: Never  . Marital Status: Widowed  Intimate Partner Violence: Not At Risk  . Fear of Current  or Ex-Partner: No  . Emotionally Abused: No  . Physically Abused: No  . Sexually Abused: No    Past Medical History, Surgical history, Social history, and Family history were reviewed and updated as appropriate.  Please see review of systems for further details on the patient's review from today.   Objective:   Physical Exam:  There were no vitals taken for this visit.  Physical Exam Neurological:     Mental Status: She is alert and oriented to person, place, and time.     Cranial Nerves: No dysarthria.  Psychiatric:        Attention and Perception: Attention and perception normal.        Speech: Speech normal.        Behavior: Behavior is cooperative.        Thought Content: Thought content normal. Thought content is not paranoid or delusional. Thought content does not include homicidal or suicidal ideation. Thought content does not include homicidal or suicidal plan.        Cognition and Memory: Cognition and memory normal.        Judgment: Judgment normal.     Comments: Insight intact Less anxious and less depressed mood     Lab Review:     Component Value Date/Time   NA 138 07/14/2020 1020   K 4.1 07/14/2020 1020   CL 99 07/14/2020 1020   CO2 19 (L) 07/14/2020 1020   GLUCOSE 95 07/14/2020 1020   GLUCOSE 121 (H) 05/17/2020 1146   BUN 13 07/14/2020 1020   CREATININE 0.80 07/14/2020 1020   CALCIUM 9.6 07/14/2020 1020   PROT 7.1 06/14/2019 1509   ALBUMIN 4.6 06/14/2019 1509   AST 10 06/14/2019 1509   ALT 9 06/14/2019 1509   ALKPHOS 65 06/14/2019 1509   BILITOT 0.5 06/14/2019 1509   GFRNONAA 77 07/14/2020 1020   GFRAA 89 07/14/2020 1020       Component Value Date/Time   WBC 9.3 05/17/2020 1146   RBC 4.79 05/17/2020 1146   HGB 12.5 05/17/2020 1146   HCT 39.7 05/17/2020 1146   PLT 353 05/17/2020 1146   MCV 82.9 05/17/2020 1146   MCH 26.1 05/17/2020 1146   MCHC 31.5 05/17/2020 1146   RDW 13.4 05/17/2020 1146   LYMPHSABS 1.6 06/14/2019 1509   MONOABS 0.4  06/14/2019 1509   EOSABS 0.2 06/14/2019 1509   BASOSABS 0.1 06/14/2019 1509    No results found for: POCLITH, LITHIUM   No results found for: PHENYTOIN, PHENOBARB, VALPROATE, CBMZ   .res Assessment: Plan:   Will increase Paxil to 30 mg po qd to further improve mood and anxiety.  Will decrease Latuda to 20 mg po qd to possibly improve sleep disturbance. Continue Temazepam 30 mg po QHS for insomnia.  Continue Xanax prn anxiety.  Continue Lamictal 100 mg po qd for depression.  Pt to follow-up in 4 weeks or sooner if clinically indicated.  Patient advised to contact office with any questions, adverse effects, or acute worsening in signs and symptoms.   Stacey Alvarez was seen today for insomnia and follow-up.  Diagnoses and all orders for this visit:  Generalized anxiety disorder -     PARoxetine (PAXIL) 30 MG tablet; Take 1 tablet (30 mg total) by mouth daily. -     ALPRAZolam (XANAX) 1 MG tablet; Take 1 tablet (1 mg total) by mouth 4 (four) times daily as needed for anxiety.  Mild episode of recurrent major depressive disorder (HCC) -     lurasidone (LATUDA) 20 MG TABS tablet; Take 1 tablet (20 mg total) by mouth daily with lunch. -     PARoxetine (PAXIL) 30 MG tablet; Take 1 tablet (30 mg total) by mouth daily.  Primary insomnia -  temazepam (RESTORIL) 30 MG capsule; Take 1 capsule (30 mg total) by mouth at bedtime.     Please see After Visit Summary for patient specific instructions.  Future Appointments  Date Time Provider [place]  10/09/2020  2:00 PM Midge Minium, MD [facility]  10/26/2020 12:30 PM [facility]    No orders of the defined types were placed in this encounter.     -------------------------------

## 2020-10-04 DIAGNOSIS — G4733 Obstructive sleep apnea (adult) (pediatric): Secondary | ICD-10-CM | POA: Diagnosis not present

## 2020-10-04 DIAGNOSIS — R062 Wheezing: Secondary | ICD-10-CM | POA: Diagnosis not present

## 2020-10-04 DIAGNOSIS — J849 Interstitial pulmonary disease, unspecified: Secondary | ICD-10-CM | POA: Diagnosis not present

## 2020-10-06 ENCOUNTER — Other Ambulatory Visit: Payer: Self-pay | Admitting: Psychiatry

## 2020-10-06 DIAGNOSIS — F411 Generalized anxiety disorder: Secondary | ICD-10-CM

## 2020-10-09 ENCOUNTER — Other Ambulatory Visit (INDEPENDENT_AMBULATORY_CARE_PROVIDER_SITE_OTHER): Payer: Medicare HMO

## 2020-10-09 ENCOUNTER — Ambulatory Visit (INDEPENDENT_AMBULATORY_CARE_PROVIDER_SITE_OTHER): Payer: Medicare HMO | Admitting: Family Medicine

## 2020-10-09 ENCOUNTER — Encounter: Payer: Self-pay | Admitting: Family Medicine

## 2020-10-09 ENCOUNTER — Other Ambulatory Visit: Payer: Self-pay

## 2020-10-09 VITALS — BP 130/70 | HR 92 | Temp 99.1°F | Resp 20 | Ht 64.0 in | Wt 244.4 lb

## 2020-10-09 DIAGNOSIS — Z23 Encounter for immunization: Secondary | ICD-10-CM | POA: Diagnosis not present

## 2020-10-09 DIAGNOSIS — F331 Major depressive disorder, recurrent, moderate: Secondary | ICD-10-CM | POA: Diagnosis not present

## 2020-10-09 DIAGNOSIS — E559 Vitamin D deficiency, unspecified: Secondary | ICD-10-CM

## 2020-10-09 DIAGNOSIS — I10 Essential (primary) hypertension: Secondary | ICD-10-CM | POA: Diagnosis not present

## 2020-10-09 DIAGNOSIS — R300 Dysuria: Secondary | ICD-10-CM

## 2020-10-09 DIAGNOSIS — J849 Interstitial pulmonary disease, unspecified: Secondary | ICD-10-CM | POA: Diagnosis not present

## 2020-10-09 DIAGNOSIS — E785 Hyperlipidemia, unspecified: Secondary | ICD-10-CM | POA: Diagnosis not present

## 2020-10-09 DIAGNOSIS — R69 Illness, unspecified: Secondary | ICD-10-CM | POA: Diagnosis not present

## 2020-10-09 LAB — POCT URINALYSIS DIPSTICK OB
Bilirubin, UA: 1
Glucose, UA: NEGATIVE
Spec Grav, UA: >=1.03 — AB (ref 1.010–1.025)
Urobilinogen, UA: 0.2 E.U./dL
pH, UA: 5.5 (ref 5.0–8.0)

## 2020-10-09 MED ORDER — CEPHALEXIN 500 MG PO CAPS: 500.0000 mg | ORAL_CAPSULE | Freq: Two times a day (BID) | ORAL | 0 refills | Status: AC

## 2020-10-09 MED FILL — MYCOPHENOLATE MOFETIL 500 MG TABLET: ORAL | 30 days supply | Qty: 60 | Fill #7

## 2020-10-09 NOTE — Unmapped (Signed)
Spoke with patient. Patient approved copay of $47 for Mycophenolate for this one delivery. Delivery has been rescheduled as originally intended. Delivery on 1/25 via ups to prescription address. No questions for the pharmacist.

## 2020-10-09 NOTE — Unmapped (Signed)
[facility] Refill Coordination Note    Specialty Medication(s) to be Shipped:   General Specialty: mycophenolate 500mg     Other medication(s) to be shipped: No additional medications requested for fill at this time     Amber Bush, DOB: 12-05-1955  Phone: [phone] (home)       All above HIPAA information was verified with patient.     Was a Nurse, learning disability used for this call? No    Completed refill call assessment today to schedule patient's medication shipment from the [facility] [phone]).       Specialty medication(s) and dose(s) confirmed: Regimen is correct and unchanged.   Changes to medications: Amber Bush reports no changes at this time.  Changes to insurance: No  Questions for the pharmacist: No    Confirmed patient received Welcome Packet with first shipment. The patient will receive a drug information handout for each medication shipped and additional FDA Medication Guides as required.       DISEASE/MEDICATION-SPECIFIC INFORMATION        N/A    SPECIALTY MEDICATION ADHERENCE     Medication Adherence    Patient reported X missed doses in the last month: 0  Specialty Medication: Mycophenolate 500mg   Patient is on additional specialty medications: No  Patient is on more than two specialty medications: No                Mycophenolate 500 mg: 3 days of medicine on hand         SHIPPING     Shipping address confirmed in Epic.     Delivery Scheduled: Yes, Expected medication delivery date: 10/10/20.     Medication will be delivered via UPS to the prescription address in Epic WAM.    Nancy Nordmann [facility] Specialty Technician

## 2020-10-09 NOTE — Unmapped (Signed)
Amber Bush 's Mycophenolate shipment will be delayed as a result of a high copay ($47).     I have reached out to the patient and left a voicemail message.  We will wait for a call back from the patient to reschedule the delivery.  We have not confirmed the new delivery date.

## 2020-10-09 NOTE — Progress Notes (Signed)
   Subjective:    Patient ID: Stacey Alvarez, female    DOB: [birth year], 65 y.o.   MRN: [id number]  HPI HTN- chronic problem, on Lisinopril 20mg  daily, Amlodipine 5mg  QHS w/ adequate control.  No CP, HAs, visual changes, edema  Hyperlipidemia- chronic problem, on Fenofibrate 160mg  daily and Pravastatin 40mg  daily.  No abd pain, N/V  Obesity- BMI is 41.95  No regular walking or exercise.  Not following a particular diet  Depression- chronic problem.  Seeing Thayer Headings at [facility]  ILD- following at [facility], has appt in March.  Currently on Prednisone 2.5mg  daily and Cellcept 500mg  BID.  Pt reports SOB is well controlled.  ? UTI- dark in color, dysuria for a few days.  + urgency, hesitancy.   Review of Systems For ROS see HPI   This visit occurred during the SARS-CoV-2 public health emergency.  Safety protocols were in place, including screening questions prior to the visit, additional usage of staff PPE, and extensive cleaning of exam room while observing appropriate contact time as indicated for disinfecting solutions.       Objective:   Physical Exam Vitals reviewed.  Constitutional:      General: She is not in acute distress.    Appearance: She is well-developed and well-nourished. She is obese.  HENT:     Head: Normocephalic and atraumatic.  Eyes:     Extraocular Movements: EOM normal.     Conjunctiva/sclera: Conjunctivae normal.     Pupils: Pupils are equal, round, and reactive to light.  Neck:     Thyroid: No thyromegaly.  Cardiovascular:     Rate and Rhythm: Normal rate and regular rhythm.     Pulses: Normal pulses and intact distal pulses.     Heart sounds: Normal heart sounds. No murmur heard.   Pulmonary:     Effort: Pulmonary effort is normal. No respiratory distress.     Breath sounds: Normal breath sounds.  Abdominal:     General: There is no distension.     Palpations: Abdomen is soft.     Tenderness: There is no abdominal tenderness.   Musculoskeletal:        General: No edema.     Cervical back: Normal range of motion and neck supple.     Right lower leg: No edema.     Left lower leg: No edema.  Lymphadenopathy:     Cervical: No cervical adenopathy.  Skin:    General: Skin is warm and dry.  Neurological:     Mental Status: She is alert and oriented to person, place, and time.  Psychiatric:        Mood and Affect: Mood and affect normal.        Behavior: Behavior normal.           Assessment & Plan:  Dysuria- new.  Pt admits to limited water intake.  She developed dysuria a few days ago and has had urinary urgency and hesitancy since.  UA suspicious for infxn.  Start Keflex.  Pt expressed understanding and is in agreement w/ plan.

## 2020-10-09 NOTE — Assessment & Plan Note (Signed)
Ongoing issue for pt.  BMI is 41.95.  Pt admits to no exercise and not following a particular diet.  Stressed the need for healthy food choices and regular physical activity.  Check labs to risk stratify.  Will follow.

## 2020-10-09 NOTE — Assessment & Plan Note (Signed)
Chronic problem.  On Fenofibrate 160mg  daily and Pravastatin 40mg  daily.  Stressed need for healthy diet and regular exercise.  Check labs.  Adjust meds prn

## 2020-10-09 NOTE — Assessment & Plan Note (Signed)
Chronic problem.  Following w/ Thayer Headings at [facility].  Wants to check B12 and Folate to see if a deficiency is contributing to her mood.  Will check and replete prn.

## 2020-10-09 NOTE — Assessment & Plan Note (Signed)
Chronic problem.  Adequate control on Lisinopril 20mg  daily and Amlodipine 5mg  QHS.  Currently asymptomatic.  Check labs.  No anticipated med changes.

## 2020-10-09 NOTE — Addendum Note (Signed)
Addended by: Adah Salvage F on: 10/09/2020 03:11 PM   Modules accepted: Orders

## 2020-10-09 NOTE — Patient Instructions (Signed)
Schedule your complete physical in 6 months We'll notify you of your lab results and make any changes if needed Continue to work on healthy diet and regular exercise- you can do it!!! START the Cephalexin (Keflex) twice daily- w/ food Drink plenty of fluids Call with any questions or concerns Stay Safe!  Stay Healthy!!!

## 2020-10-09 NOTE — Addendum Note (Signed)
Addended by: Genevie Cheshire L on: 10/09/2020 03:11 PM   Modules accepted: Orders

## 2020-10-09 NOTE — Assessment & Plan Note (Signed)
Following at [facility].  On 2.5mg  Prednisone and Cellcept 500mg  BID.  Sxs are currently stable

## 2020-10-10 ENCOUNTER — Telehealth: Payer: Self-pay

## 2020-10-10 LAB — CBC WITH DIFFERENTIAL/PLATELET
Basophils Absolute: 0.1 10*3/uL (ref 0.0–0.1)
Basophils Relative: 1.2 % (ref 0.0–3.0)
Eosinophils Absolute: 0.1 10*3/uL (ref 0.0–0.7)
Eosinophils Relative: 1.2 % (ref 0.0–5.0)
HCT: 39.4 % (ref 36.0–46.0)
Hemoglobin: 13 g/dL (ref 12.0–15.0)
Lymphocytes Relative: 20.4 % (ref 12.0–46.0)
Lymphs Abs: 1.6 10*3/uL (ref 0.7–4.0)
MCHC: 33 g/dL (ref 30.0–36.0)
MCV: 80.8 fl (ref 78.0–100.0)
Monocytes Absolute: 0.4 10*3/uL (ref 0.1–1.0)
Monocytes Relative: 4.7 % (ref 3.0–12.0)
Neutro Abs: 5.6 10*3/uL (ref 1.4–7.7)
Neutrophils Relative %: 72.5 % (ref 43.0–77.0)
Platelets: 262 10*3/uL (ref 150.0–400.0)
RBC: 4.88 Mil/uL (ref 3.87–5.11)
RDW: 14.1 % (ref 11.5–15.5)
WBC: 7.8 10*3/uL (ref 4.0–10.5)

## 2020-10-10 LAB — BASIC METABOLIC PANEL
BUN: 13 mg/dL (ref 6–23)
CO2: 28 mEq/L (ref 19–32)
Calcium: 9.5 mg/dL (ref 8.4–10.5)
Chloride: 102 mEq/L (ref 96–112)
Creatinine, Ser: 0.81 mg/dL (ref 0.40–1.20)
GFR: 75.39 mL/min (ref >60.00)
Glucose, Bld: 102 mg/dL — ABNORMAL HIGH (ref 70–99)
Potassium: 4 mEq/L (ref 3.5–5.1)
Sodium: 137 mEq/L (ref 135–145)

## 2020-10-10 LAB — TSH: TSH: 0.45 u[IU]/mL (ref 0.35–4.50)

## 2020-10-10 LAB — HEPATIC FUNCTION PANEL
ALT: 10 U/L (ref 0–35)
AST: 13 U/L (ref 0–37)
Albumin: 4.5 g/dL (ref 3.5–5.2)
Alkaline Phosphatase: 74 U/L (ref 39–117)
Bilirubin, Direct: 0 mg/dL (ref 0.0–0.3)
Total Bilirubin: 0.4 mg/dL (ref 0.2–1.2)
Total Protein: 7.1 g/dL (ref 6.0–8.3)

## 2020-10-10 LAB — LIPID PANEL
Cholesterol: 207 mg/dL — ABNORMAL HIGH (ref 0–200)
HDL: 44.2 mg/dL (ref >39.00)
LDL Cholesterol: 127 mg/dL — ABNORMAL HIGH (ref 0–99)
NonHDL: 162.66
Total CHOL/HDL Ratio: 5
Triglycerides: 179 mg/dL — ABNORMAL HIGH (ref 0.0–149.0)
VLDL: 35.8 mg/dL (ref 0.0–40.0)

## 2020-10-10 LAB — VITAMIN D 25 HYDROXY (VIT D DEFICIENCY, FRACTURES): VITD: 35.17 ng/mL (ref 30.00–100.00)

## 2020-10-10 LAB — B12 AND FOLATE PANEL
Folate: 7 ng/mL (ref >5.9)
Vitamin B-12: 643 pg/mL (ref 211–911)

## 2020-10-10 LAB — HEMOGLOBIN A1C: Hgb A1c MFr Bld: 5.5 % (ref 4.6–6.5)

## 2020-10-10 NOTE — Chronic Care Management (AMB) (Signed)
    Chronic Care Management Pharmacy Assistant   Name: Lue Sykora  MRN: [id number] DOB: Jun 25, 1956  Reason for Encounter: Reschedule appointment  Appointment with clinical pharmacist missed on 06/08/2020. I attempted to reach the patient to reschedule this appointment. I left a message for the patient to return my call [phone].  April D Calhoun, [place] Pharmacist Assistant [phone]

## 2020-10-12 LAB — URINE CULTURE
MICRO NUMBER:: 11450477
SPECIMEN QUALITY:: ADEQUATE

## 2020-10-12 NOTE — Telephone Encounter (Cosign Needed)
error 

## 2020-10-17 ENCOUNTER — Encounter: Payer: Self-pay | Admitting: Family Medicine

## 2020-10-17 ENCOUNTER — Telehealth: Payer: Self-pay

## 2020-10-17 ENCOUNTER — Other Ambulatory Visit: Payer: Self-pay | Admitting: Family Medicine

## 2020-10-17 NOTE — Telephone Encounter (Signed)
Prior Authorization submitted and approved for TEMAZEPAM 30 MG effective 09/16/2020-09/15/2021 with [employer]/Medicare ID# [id number]

## 2020-10-18 ENCOUNTER — Telehealth: Payer: Self-pay | Admitting: Psychiatry

## 2020-10-18 DIAGNOSIS — F5101 Primary insomnia: Secondary | ICD-10-CM

## 2020-10-18 NOTE — Telephone Encounter (Signed)
Attempted to reach pt x 2. Will send My Chart Msg.

## 2020-10-18 NOTE — Telephone Encounter (Signed)
Pt called and said that she is not sleeping and the medicine respidol is not working. Please call with something different at [phone]

## 2020-10-19 MED ORDER — TRAZODONE HCL 50 MG PO TABS: ORAL_TABLET | ORAL | 1 refills | Status: DC

## 2020-10-20 ENCOUNTER — Other Ambulatory Visit: Payer: Self-pay | Admitting: Family Medicine

## 2020-10-23 MED ORDER — PREDNISONE 2.5 MG TABLET
ORAL_TABLET | Freq: Every day | ORAL | 3 refills | 90.00000 days | Status: CP
Start: 2020-10-23 — End: 2021-10-23

## 2020-10-26 ENCOUNTER — Ambulatory Visit: Payer: Medicare HMO

## 2020-11-01 ENCOUNTER — Telehealth: Payer: Medicare HMO | Admitting: Psychiatry

## 2020-11-04 DIAGNOSIS — R062 Wheezing: Secondary | ICD-10-CM | POA: Diagnosis not present

## 2020-11-04 DIAGNOSIS — J849 Interstitial pulmonary disease, unspecified: Secondary | ICD-10-CM | POA: Diagnosis not present

## 2020-11-04 DIAGNOSIS — G4733 Obstructive sleep apnea (adult) (pediatric): Secondary | ICD-10-CM | POA: Diagnosis not present

## 2020-11-06 ENCOUNTER — Telehealth: Payer: Self-pay | Admitting: Psychiatry

## 2020-11-06 NOTE — Telephone Encounter (Signed)
Patient called in requesting samples for Latuda. States she only has a few left. She has appt 3/17.

## 2020-11-20 NOTE — Unmapped (Signed)
[facility] Clinical Assessment & Refill Coordination Note    Amber Bush, DOB: 01-18-1956  Phone: [phone] (home)     All above HIPAA information was verified with patient.     Was a Nurse, learning disability used for this call? No    Specialty Medication(s):   Inflammatory Disorders: mycophenolate     Current Outpatient Medications   Medication Sig Dispense Refill   ??? ALPRAZolam (XANAX) 1 MG tablet Take 2 mg by mouth nightly. 1/2 tab in the morning.     ??? amLODIPine (NORVASC) 5 MG tablet Take 2.5 mg by mouth daily. 2.5 mg QHS     ??? biotin 10,000 mcg cap Take 10,000 mcg by mouth Two (2) times a day.     ??? cetirizine (ZYRTEC) 10 MG tablet Take 10 mg by mouth daily.     ??? fenofibrate (LOFIBRA) 160 MG tablet Take 160 mg by mouth daily.     ??? FLUoxetine (PROZAC) 40 MG capsule Take 40 mg by mouth daily. 40 mg once daily (Patient not taking: Reported on 01/17/2020)     ??? fluticasone (FLONASE) 50 mcg/actuation nasal spray 1 spray by Each Nare route daily.     ??? lamoTRIgine (LAMICTAL) 100 MG tablet Take 100 mg by mouth daily. Taking 1/2 tablet (50 mg) once daily     ??? lisinopril (PRINIVIL,ZESTRIL) 20 MG tablet Take 20 mg by mouth Two (2) times a day.      ??? mycophenolate (CELLCEPT) 500 mg tablet Take 1 tablet (500 mg total) by mouth Two (2) times a day. 60 tablet 11   ??? predniSONE (DELTASONE) 2.5 MG tablet Take 1 tablet (2.5 mg total) by mouth daily. 90 tablet 3   ??? promethazine (PHENERGAN) 25 MG tablet Take 25 mg by mouth every six (6) hours as needed for nausea.     ??? temAZEpam (RESTORIL) 30 mg capsule Take 30 mg by mouth nightly as needed for sleep.     ??? vortioxetine (TRINTELLIX) 20 mg tablet Take 20 mg by mouth daily.       No current facility-administered medications for this visit.        Changes to medications: Amber Bush reports no changes at this time.    Allergies   Allergen Reactions   ??? Sulfa (Sulfonamide Antibiotics) Rash   ??? Sulfasalazine Rash       Changes to allergies: No    SPECIALTY MEDICATION ADHERENCE     Mycophenolate 500 mg: 14 days of medicine on hand       Medication Adherence    Patient reported X missed doses in the last month: 0  Specialty Medication: mycophenolate 500mg   Informant: patient          Specialty medication(s) dose(s) confirmed: Regimen is correct and unchanged.     Are there any concerns with adherence? No    Adherence counseling provided? Not needed    CLINICAL MANAGEMENT AND INTERVENTION      Clinical Benefit Assessment:    Do you feel the medicine is effective or helping your condition? Yes    Clinical Benefit counseling provided? Not needed    Adverse Effects Assessment:    Are you experiencing any side effects? No    Are you experiencing difficulty administering your medicine? No    Quality of Life Assessment:    How many days over the past month did your ILD  keep you from your normal activities? For example, brushing your teeth or getting up in the morning.  Patient declined to answer    Have you discussed this with your provider? Not needed    Therapy Appropriateness:    Is therapy appropriate? Yes, therapy is appropriate and should be continued    DISEASE/MEDICATION-SPECIFIC INFORMATION      N/A    PATIENT SPECIFIC NEEDS     - Does the patient have any physical, cognitive, or cultural barriers? No    - Is the patient high risk? Yes, patient is taking a REMS drug. Medication is dispensed in compliance with REMS program    - Does the patient require a Care Management Plan? No     - Does the patient require physician intervention or other additional services (i.e. nutrition, smoking cessation, social work)? No      SHIPPING     Specialty Medication(s) to be Shipped:   Inflammatory Disorders: mycophenolate    Other medication(s) to be shipped: No additional medications requested for fill at this time     Changes to insurance: No    Delivery Scheduled: Yes, Expected medication delivery date: 11/30/20.     Medication will be delivered via UPS to the confirmed prescription address in [facility].    The patient will receive a drug information handout for each medication shipped and additional FDA Medication Guides as required.  Verified that patient has previously received a Conservation officer, historic buildings.    All of the patient's questions and concerns have been addressed.    Amber Bush   [facility] Specialty Pharmacist

## 2020-11-29 MED FILL — MYCOPHENOLATE MOFETIL 500 MG TABLET: ORAL | 30 days supply | Qty: 60 | Fill #8

## 2020-11-30 ENCOUNTER — Encounter: Payer: Self-pay | Admitting: Psychiatry

## 2020-11-30 ENCOUNTER — Telehealth (INDEPENDENT_AMBULATORY_CARE_PROVIDER_SITE_OTHER): Payer: Medicare HMO | Admitting: Psychiatry

## 2020-11-30 DIAGNOSIS — F330 Major depressive disorder, recurrent, mild: Secondary | ICD-10-CM

## 2020-11-30 DIAGNOSIS — F332 Major depressive disorder, recurrent severe without psychotic features: Secondary | ICD-10-CM

## 2020-11-30 DIAGNOSIS — F411 Generalized anxiety disorder: Secondary | ICD-10-CM

## 2020-11-30 DIAGNOSIS — R69 Illness, unspecified: Secondary | ICD-10-CM | POA: Diagnosis not present

## 2020-11-30 MED ORDER — PAROXETINE HCL 30 MG PO TABS: 30.0000 mg | ORAL_TABLET | Freq: Every day | ORAL | 0 refills | Status: DC

## 2020-11-30 MED ORDER — LAMOTRIGINE 100 MG PO TABS: 100.0000 mg | ORAL_TABLET | Freq: Every day | ORAL | 0 refills | Status: DC

## 2020-11-30 NOTE — Progress Notes (Signed)
Stacey Alvarez [id number] 19-Jun-1956 65 y.o.  Virtual Visit via Video Note  I connected with pt @ on 11/30/20  at 10:30 AM EDT by a video enabled telemedicine application and verified that I am speaking with the correct person using two identifiers.   I discussed the limitations of evaluation and management by telemedicine and the availability of in person appointments. The patient expressed understanding and agreed to proceed.  I discussed the assessment and treatment plan with the patient. The patient was provided an opportunity to ask questions and all were answered. The patient agreed with the plan and demonstrated an understanding of the instructions.   The patient was advised to call back or seek an in-person evaluation if the symptoms worsen or if the condition fails to improve as anticipated.  I provided 30 minutes of non-face-to-face time during this encounter.  The patient was located at home.  The provider was located at [place].   Thayer Headings, PMHNP   Subjective:   Patient ID:  Stacey Alvarez is a 65 y.o. (DOB 1956/01/18) female.  Chief Complaint:  Chief Complaint  Patient presents with  . Follow-up    Depression, anxiety, omnia and insomnia    HPI Stacey Alvarez presents for follow-up of anxiety, depression, and insomnia. She reports improved mood and anxiety with increase in Paxil. She reports, "the Paxil has helped a lot with the knot in my stomach." She reports, "it seems I have a better outlook than I have had before." She reports that her motivation is improving. She reports that she has been decreasing latuda on her own due to concerns about possible adverse reactions. She reduced Latuda from 20 mg to 10 mg a week ago and has not seen any worsening s/s. She reports that she still has some anxiety. Has some anticipatory anxiety about going to certain places and is better able to push herself to do things. Denies any recent panic attacks. She reports that  worry "doesn't seem to be as deep." Reports having/ adverse reaction to Trazodone. She reports that she is taking Alprazolam and Temazepam earlier and this has been helpful. She reports that she is now sleeping about 8 hours after she is able to fall asleep. Appetite has increased and reports that she was not eating regularly in the past. Energy and motivation have improved. She reports that she has been doing projects that she has been putting off for awhile. She reports that concentration has improved some and mind is wandering less. Denies SI.   She reports that she is smiling more.   Today would have been her husband's birthday and she reports that she has been more positive about this. Son's death anniversary was 12/30/2022.  She is making plans to go see friend in [place] this weekend. She plans to start walking more. Has been going out to lunch with neighbor and also an old friend and is enjoying this.   Typically takes one Xanax in the morning and 2 tabs in the evening.  Past Psychiatric Medication Trials: (Reports sensitivity to medication) Lamictal- reports that 150 mg felt that this was too high. Lithium- Ineffective Paxil- Took years ago in combination with other medications Lexapro-ineffective Zoloft- ineffective Prozac- Has tremor and unsteadiness on 80 mg. Has taken about 5 years. Unsure of benefit. Effexor XR- adverse reaction. Confusion. Cymbalta- Ineffective. Remeron- Wt gain. Trintellix- GI side effects. Some partial improvement., Itching on palms Wellbutrin- rashduring clinical trials. Gabapentin- felt unsteady. Suppressed breathing. Lyrica-Suppressed breathing. Temazepam Xanax Abilify-Ineffective. Risperdal-  wt gain Rexulti- Akathisia Latuda Viibryd- Reports that Dr. Toy Care has told her that she was on it and she does not recall this. Propranolol- felt "revved up." Trazodone- Adverse effects.  Review of Systems:  Review of Systems  Musculoskeletal:  Positive for arthralgias. Negative for gait problem.  Neurological: Negative for tremors.  Psychiatric/Behavioral:       Please refer to HPI    Medications: I have reviewed the patient's current medications.  Current Outpatient Medications  Medication Sig Dispense Refill  . ALPRAZolam (XANAX) 1 MG tablet TAKE ONE TABLET BY MOUTH FOUR TIMES A DAY AS NEEDED FOR ANXIETY 240 tablet 0  . amLODipine (NORVASC) 5 MG tablet TAKE 1/2 TABLET BY MOUTH IN THE MORNING AND 1 TABLET AT BEDTIME (Patient taking differently: TAKES 1 TABLET AT BEDTIME) 135 tablet 3  . cetirizine (ZYRTEC) 10 MG tablet Take 10 mg by mouth daily.    . fenofibrate 160 MG tablet TAKE ONE TABLET BY MOUTH DAILY 90 tablet 0  . fluticasone (FLONASE) 50 MCG/ACT nasal spray Place 1 spray into both nostrils daily.    Marland Kitchen lisinopril (ZESTRIL) 20 MG tablet TAKE ONE TABLET BY MOUTH DAILY 90 tablet 0  . mycophenolate (CELLCEPT) 500 MG tablet Take 500 mg by mouth 2 (two) times daily.     . pantoprazole (PROTONIX) 40 MG tablet TAKE ONE TABLET BY MOUTH TWICE A DAY (Patient taking differently: Take 40 mg by mouth daily.) 120 tablet 0  . pravastatin (PRAVACHOL) 40 MG tablet Take 1 tablet (40 mg total) by mouth daily. 90 tablet 3  . predniSONE (DELTASONE) 2.5 MG tablet Take 2.5 mg by mouth daily with breakfast.  11  . promethazine (PHENERGAN) 25 MG tablet TAKE ONE TABLET BY MOUTH EVERY 8 HOURS AS NEEDED FOR FOR NAUSEA AND VOMITING 30 tablet 0  . temazepam (RESTORIL) 30 MG capsule Take 1 capsule (30 mg total) by mouth at bedtime. 90 capsule 0  . butalbital-aspirin-caffeine-codeine (FIORINAL WITH CODEINE) 50-325-40-30 MG capsule Take 1 capsule by mouth every 6 (six) hours as needed for pain. 21 capsule 0  . cyanocobalamin (,VITAMIN B-12,) 1000 MCG/ML injection Inject 1 mL into the skin once a week for 4 weeks, then once a month for 5 months. (Patient not taking: Reported on 11/30/2020) 1 mL 8  . lamoTRIgine (LAMICTAL) 100 MG tablet Take 1 tablet (100 mg  total) by mouth daily. 90 tablet 0  . ondansetron (ZOFRAN-ODT) 4 MG disintegrating tablet Take 4 mg by mouth every 8 (eight) hours as needed. (Patient not taking: No sig reported)    . OXYGEN Inhale 2 L into the lungs as needed (SOB).    Marland Kitchen PARoxetine (PAXIL) 30 MG tablet Take 1 tablet (30 mg total) by mouth daily. 90 tablet 0  . pregabalin (LYRICA) 75 MG capsule Take 1 capsule (75 mg total) by mouth 2 (two) times daily. (Patient not taking: Reported on 11/30/2020) 60 capsule 1  . SYRINGE-NEEDLE, DISP, 3 ML (B-D 3CC LUER-LOK SYR 25GX1") 25G X 1" 3 ML MISC USE TO ADMINSTER B12 INJECTIONS 1 TIME A WEEK FOR 4 WEEKS THEN ONCE MONTHLY FOR 5 MONTH 10 each 0   No current facility-administered medications for this visit.    Medication Side Effects: Other: Increased appetite  Allergies:  Allergies  Allergen Reactions  . Lisinopril     COUGH   . Trazodone And Nefazodone     Paresthesias, weakness  . Trintellix [Vortioxetine]   . Macrobid [Nitrofurantoin Monohyd Macro] Diarrhea    Diarrhea, GI upset  .  Sulfa Antibiotics Rash    Past Medical History:  Diagnosis Date  . Allergy   . Anxiety   . Arthritis    KNEES,BACK,HIPS  . Depression   . Fibromyalgia   . GERD (gastroesophageal reflux disease)   . Hyperlipidemia   . Hypertension   . IBS (irritable bowel syndrome)   . Interstitial lung disease ([place])   . Pulmonary embolism ([place]) 09/2016   provoked- s/p rotator cuff repair  . Sleep apnea     Family History  Problem Relation Age of Onset  . Diabetes Mother   . Heart disease Mother   . Anxiety disorder Mother   . Depression Mother   . Kidney disease Mother   . Diabetes Father   . Heart disease Father   . Anxiety disorder Father   . Depression Father   . Diabetes Paternal Grandmother   . Cystic fibrosis Son   . Alcohol abuse Son   . Drug abuse Son   . Anxiety disorder Son   . Depression Son   . Breast cancer Maternal Aunt   . Breast cancer Maternal Aunt     Social History    Socioeconomic History  . Marital status: Widowed    Spouse name: Not on file  . Number of children: 2  . Years of education: 70  . Highest education level: High school graduate  Occupational History  . 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 Smoking status: Former Research scientist (life sciences)  . Smokeless tobacco: Never Used  . Tobacco comment: smoked 2 years in late teens about 8-10 cigs daily.   Vaping Use  . Vaping Use: Never used  Substance and Sexual Activity  . Alcohol use: No    Alcohol/week: 0.0 standard drinks  . Drug use: No  . Sexual activity: Not on file  Other Topics Concern  . Not on file  Social History Narrative   Lives alone, widowed. Has one dtr. in [place]. [place] and one son in [place]./fim   Social Determinants of Health   Financial Resource Strain: Low Risk   . Difficulty of Paying Living Expenses: Not hard at all  Food Insecurity: No Food Insecurity  . Worried About Charity fundraiser in the Last Year: Never true  . Ran Out of Food in the Last Year: Never true  Transportation Needs: No Transportation Needs  . Lack of Transportation (Medical): No  . Lack of Transportation (Non-Medical): No  Physical Activity: Insufficiently Active  . Days of Exercise per Week: 4 days  . Minutes of Exercise per Session: 20 min  Stress: No Stress Concern Present  . Feeling of Stress : Only a little  Social Connections: Socially Isolated  . Frequency of Communication with Friends and Family: More than three times a week  . Frequency of Social Gatherings with Friends and Family: Never  . Attends Religious Services: Never  . Active Member of Clubs or Organizations: No  . Attends Archivist Meetings: Never  . Marital Status: Widowed  Intimate Partner Violence: Not At Risk  . Fear of Current or Ex-Partner: No  . Emotionally Abused: No  . Physically Abused: No  . Sexually Abused: No    Past Medical History, Surgical history, Social history, and  Family history were reviewed and updated as appropriate.   Please see review of systems for further details on the patient's review from today.   Objective:   Physical Exam:  There were no vitals taken for this visit.  Physical Exam Neurological:     Mental Status: She is alert and oriented to person, place, and time.     Cranial Nerves: No dysarthria.  Psychiatric:        Attention and Perception: Attention and perception normal.        Speech: Speech normal.        Behavior: Behavior is cooperative.        Thought Content: Thought content normal. Thought content is not paranoid or delusional. Thought content does not include homicidal or suicidal ideation. Thought content does not include homicidal or suicidal plan.        Cognition and Memory: Cognition and memory normal.        Judgment: Judgment normal.     Comments: Insight intact Mood presents as less depressed and less anxious compared to recent exams.     Lab Review:     Component Value Date/Time   NA 137 10/09/2020 1447   NA 138 07/14/2020 1020   K 4.0 10/09/2020 1447   CL 102 10/09/2020 1447   CO2 28 10/09/2020 1447   GLUCOSE 102 (H) 10/09/2020 1447   BUN 13 10/09/2020 1447   BUN 13 07/14/2020 1020   CREATININE 0.81 10/09/2020 1447   CALCIUM 9.5 10/09/2020 1447   PROT 7.1 10/09/2020 1447   ALBUMIN 4.5 10/09/2020 1447   AST 13 10/09/2020 1447   ALT 10 10/09/2020 1447   ALKPHOS 74 10/09/2020 1447   BILITOT 0.4 10/09/2020 1447   GFRNONAA 77 07/14/2020 1020   GFRAA 89 07/14/2020 1020       Component Value Date/Time   WBC 7.8 10/09/2020 1447   RBC 4.88 10/09/2020 1447   HGB 13.0 10/09/2020 1447   HCT 39.4 10/09/2020 1447   PLT 262.0 10/09/2020 1447   MCV 80.8 10/09/2020 1447   MCH 26.1 05/17/2020 1146   MCHC 33.0 10/09/2020 1447   RDW 14.1 10/09/2020 1447   LYMPHSABS 1.6 10/09/2020 1447   MONOABS 0.4 10/09/2020 1447   EOSABS 0.1 10/09/2020 1447   BASOSABS 0.1 10/09/2020 1447    No results found  for: POCLITH, LITHIUM   No results found for: PHENYTOIN, PHENOBARB, VALPROATE, CBMZ   .res Assessment: Plan:   Patient seen for 30 minutes.  Patient reports that she would like to stop Latuda since she is concerned about possible long-term side effects and feels that Paxil has been most helpful for her mood and anxiety.  Will stop Latuda.  Continue lamotrigine 100 mg daily for mood signs and symptoms. Continue Paxil 30 mg daily for anxiety and depression. Continue alprazolam for anxiety and insomnia. Continue temazepam 30 mg at bedtime for insomnia. Recommend continuing psychotherapy with Beckey Downing, LCAS. Patient to follow-up with this provider in 6 weeks or sooner if clinically indicated. Patient advised to contact office with any questions, adverse effects, or acute worsening in signs and symptoms.  Stacey Alvarez was seen today for follow-up.  Diagnoses and all orders for this visit:  Generalized anxiety disorder -     PARoxetine (PAXIL) 30 MG tablet; Take 1 tablet (30 mg total) by mouth daily.  Mild episode of recurrent major depressive disorder (HCC) -     PARoxetine (PAXIL) 30 MG tablet; Take 1 tablet (30 mg total) by mouth daily.  Severe episode of recurrent major depressive disorder, without psychotic features ([place]) -     lamoTRIgine (LAMICTAL) 100 MG tablet; Take 1 tablet (100 mg total) by mouth daily.     Please see After Visit Summary for  patient specific instructions.  Future Appointments  Date Time Provider [place]  12/13/2020 11:10 AM [facility] MM 2 [facility]  01/11/2021  2:15 PM Thayer Headings, PMHNP CP-CP None    No orders of the defined types were placed in this encounter.     -------------------------------

## 2020-12-01 ENCOUNTER — Telehealth: Payer: Self-pay

## 2020-12-01 MED ORDER — BUTALBITAL-ASA-CAFF-CODEINE 50-325-40-30 MG PO CAPS: 1.0000 | ORAL_CAPSULE | Freq: Four times a day (QID) | ORAL | 0 refills | Status: DC | PRN

## 2020-12-01 NOTE — Telephone Encounter (Signed)
PDMP reviewed and prescription sent as requested

## 2020-12-01 NOTE — Addendum Note (Signed)
Addended by: Midge Minium on: 12/01/2020 03:51 PM   Modules accepted: Orders

## 2020-12-01 NOTE — Telephone Encounter (Signed)
Patient is requesting ASA-BUTALB-CAFF-COD 50-325-40-30. Last refill:08/08/20 Next visit:n/a Last visit:10/09/20 21 tabs 0 refills

## 2020-12-02 DIAGNOSIS — J849 Interstitial pulmonary disease, unspecified: Secondary | ICD-10-CM | POA: Diagnosis not present

## 2020-12-02 DIAGNOSIS — R062 Wheezing: Secondary | ICD-10-CM | POA: Diagnosis not present

## 2020-12-02 DIAGNOSIS — G4733 Obstructive sleep apnea (adult) (pediatric): Secondary | ICD-10-CM | POA: Diagnosis not present

## 2020-12-07 ENCOUNTER — Telehealth: Payer: Self-pay | Admitting: Psychiatry

## 2020-12-07 DIAGNOSIS — F330 Major depressive disorder, recurrent, mild: Secondary | ICD-10-CM

## 2020-12-07 DIAGNOSIS — F411 Generalized anxiety disorder: Secondary | ICD-10-CM

## 2020-12-07 MED ORDER — PAROXETINE HCL 30 MG PO TABS: 45.0000 mg | ORAL_TABLET | Freq: Every day | ORAL | 0 refills | Status: DC

## 2020-12-07 MED ORDER — PAROXETINE HCL 40 MG PO TABS: 40.0000 mg | ORAL_TABLET | Freq: Every day | ORAL | 0 refills | Status: DC

## 2020-12-07 NOTE — Telephone Encounter (Signed)
Pt informed

## 2020-12-07 NOTE — Telephone Encounter (Signed)
Please let her know that would be ok to increase Paxil to 30 mg 1.5 tablets daily. I have updated her med list to reflect the increase and can send a new prescription if needed/ when requested.

## 2020-12-07 NOTE — Telephone Encounter (Signed)
Pt called and would like to increase her paxil to 45 mg if [place] agrees. Please give her a call and  Let her know . She can split the 30mg  if need be. Stacey Alvarez's phone number is [phone]

## 2020-12-07 NOTE — Telephone Encounter (Signed)
She called back and said she will need rx sent

## 2020-12-07 NOTE — Telephone Encounter (Signed)
Please review

## 2020-12-07 NOTE — Telephone Encounter (Signed)
Please let her know that a script for the 40 mg tablet was sent to [facility].

## 2020-12-13 ENCOUNTER — Inpatient Hospital Stay: Admission: RE | Admit: 2020-12-13 | Payer: Medicare HMO | Source: Ambulatory Visit

## 2020-12-13 ENCOUNTER — Other Ambulatory Visit: Payer: Self-pay

## 2020-12-13 DIAGNOSIS — R112 Nausea with vomiting, unspecified: Secondary | ICD-10-CM

## 2020-12-13 MED ORDER — PROMETHAZINE HCL 25 MG PO TABS: ORAL_TABLET | ORAL | 0 refills | Status: DC

## 2020-12-22 ENCOUNTER — Other Ambulatory Visit: Payer: Self-pay

## 2020-12-22 ENCOUNTER — Other Ambulatory Visit: Payer: Self-pay | Admitting: Family Medicine

## 2020-12-22 MED ORDER — FENOFIBRATE 160 MG PO TABS: 160.0000 mg | ORAL_TABLET | Freq: Every day | ORAL | 0 refills | Status: DC

## 2020-12-25 NOTE — Unmapped (Signed)
[facility] Refill Coordination Note    Specialty Medication(s) to be Shipped:   General Specialty: mycophenolate 500mg     Other medication(s) to be shipped: No additional medications requested for fill at this time     Amber Bush, DOB: December 03, 1955  Phone: [phone] (home)       All above HIPAA information was verified with patient.     Was a Nurse, learning disability used for this call? No    Completed refill call assessment today to schedule patient's medication shipment from the [facility] [phone]).  All relevant notes have been reviewed.     Specialty medication(s) and dose(s) confirmed: Regimen is correct and unchanged.   Changes to medications: [name] reports no changes at this time.  Changes to insurance: No  New side effects reported not previously addressed with a pharmacist or physician: None reported  Questions for the pharmacist: No    Confirmed patient received a Conservation officer, historic buildings and a Surveyor, mining with first shipment. The patient will receive a drug information handout for each medication shipped and additional FDA Medication Guides as required.       DISEASE/MEDICATION-SPECIFIC INFORMATION        N/A    SPECIALTY MEDICATION ADHERENCE     Medication Adherence    Patient reported X missed doses in the last month: 0  Specialty Medication: Mycophenolate 500mg   Patient is on additional specialty medications: No  Patient is on more than two specialty medications: No              Were doses missed due to medication being on hold? No    Mycophenolate 500 mg: 7 days of medicine on hand       REFERRAL TO PHARMACIST     Referral to the pharmacist: Not needed      [facility]     Shipping address confirmed in Epic.     Delivery Scheduled: Yes, Expected medication delivery date: 12/28/20.     Medication will be delivered via UPS to the prescription address in Epic [facility].    Nancy Nordmann [facility] Specialty Technician

## 2020-12-27 MED FILL — MYCOPHENOLATE MOFETIL 500 MG TABLET: ORAL | 30 days supply | Qty: 60 | Fill #9

## 2021-01-01 ENCOUNTER — Encounter: Payer: Self-pay | Admitting: Family Medicine

## 2021-01-02 DIAGNOSIS — R062 Wheezing: Secondary | ICD-10-CM | POA: Diagnosis not present

## 2021-01-02 DIAGNOSIS — G4733 Obstructive sleep apnea (adult) (pediatric): Secondary | ICD-10-CM | POA: Diagnosis not present

## 2021-01-02 DIAGNOSIS — J849 Interstitial pulmonary disease, unspecified: Secondary | ICD-10-CM | POA: Diagnosis not present

## 2021-01-04 ENCOUNTER — Encounter: Payer: Self-pay | Admitting: Family Medicine

## 2021-01-04 NOTE — Telephone Encounter (Signed)
Please advise 

## 2021-01-11 ENCOUNTER — Telehealth (INDEPENDENT_AMBULATORY_CARE_PROVIDER_SITE_OTHER): Payer: Medicare HMO | Admitting: Psychiatry

## 2021-01-11 ENCOUNTER — Encounter: Payer: Self-pay | Admitting: Psychiatry

## 2021-01-11 DIAGNOSIS — F411 Generalized anxiety disorder: Secondary | ICD-10-CM

## 2021-01-11 DIAGNOSIS — R69 Illness, unspecified: Secondary | ICD-10-CM | POA: Diagnosis not present

## 2021-01-11 DIAGNOSIS — F5101 Primary insomnia: Secondary | ICD-10-CM

## 2021-01-11 DIAGNOSIS — F332 Major depressive disorder, recurrent severe without psychotic features: Secondary | ICD-10-CM

## 2021-01-11 MED ORDER — TEMAZEPAM 30 MG PO CAPS: 30.0000 mg | ORAL_CAPSULE | Freq: Every day | ORAL | 1 refills | Status: DC

## 2021-01-11 NOTE — Unmapped (Signed)
Writer returned phone call, left message stating that referrals/new patient's are not currently being accepted and TMS/ECT are treatment options, patient to call back  If interested.

## 2021-01-11 NOTE — Progress Notes (Signed)
Stacey Alvarez [id number] Nov 02, 1955 65 y.o.  Virtual Visit via Telephone Note  I connected with pt on 01/11/21 at  2:15 PM EDT by telephone and verified that I am speaking with the correct person using two identifiers.   I discussed the limitations, risks, security and privacy concerns of performing an evaluation and management service by telephone and the availability of in person appointments. I also discussed with the patient that there may be a patient responsible charge related to this service. The patient expressed understanding and agreed to proceed.   I discussed the assessment and treatment plan with the patient. The patient was provided an opportunity to ask questions and all were answered. The patient agreed with the plan and demonstrated an understanding of the instructions.   The patient was advised to call back or seek an in-person evaluation if the symptoms worsen or if the condition fails to improve as anticipated.  I provided 30 minutes of non-face-to-face time during this encounter.  The patient was located at home.  The provider was located at [place].   Thayer Headings, PMHNP   Subjective:   Patient ID:  Stacey Alvarez is a 65 y.o. (DOB 05-31-1956) female.  Chief Complaint:  Chief Complaint  Patient presents with  . Depression  . Anxiety    HPI Jhada Risk presents for follow-up of depression, anxiety, and insomnia. She reports that she is having "good days and bad days, then I hit a brick wall." She reports that she has been isolating and having persistent sad mood. She reports that anxiety has been elevated. Energy and motivation have been low. Sleep has been fair. She reports that her sleep had improved for awhile. She reports that she thinks she is getting an adequate amount of sleep. She reports excessive feelings of guilt, ie. Not getting out on a pretty day. Appetite has been decreased. She reports that weight has been unchanged. Enjoys spending  time with her daughter who visits about every other day. Reports that she planned to visit family on Easter and then decided she did not want to go and stayed home alone. Denies SI.   She reports that she will have a week where she feels more positive and then mood will become depressed again.   Her good friend has been placed in a hospice house.   Continues to see Beckey Downing, LCAS.   Past Psychiatric Medication Trials: (Reports sensitivity to medication) Lamictal- reports that 150 mg felt that this was too high. Lithium- Ineffective Paxil- Took years ago in combination with other medications Lexapro-ineffective Zoloft- ineffective Prozac- Has tremor and unsteadiness on 80 mg. Has taken about 5 years. Unsure of benefit. Effexor XR- adverse reaction. Confusion. Cymbalta- Ineffective. Remeron- Wt gain. Trintellix- GI side effects. Some partial improvement., Itching on palms Wellbutrin- rashduring clinical trials. Gabapentin- felt unsteady. Suppressed breathing. Lyrica-Suppressed breathing. Temazepam Xanax Abilify-Ineffective. Risperdal- wt gain Rexulti- Akathisia Latuda Viibryd- Reports that Dr. Toy Care has told her that she was on it and she does not recall this. Propranolol- felt "revved up." Trazodone- Adverse effects.  Review of Systems:  Review of Systems  Genitourinary:       She reports experiencing some urinary retention and "pressure" to urinate and then this has improved some  Musculoskeletal: Negative for gait problem.  Psychiatric/Behavioral:       Please refer to HPI    Medications: I have reviewed the patient's current medications.  Current Outpatient Medications  Medication Sig Dispense Refill  . ALPRAZolam (XANAX) 1 MG  tablet TAKE ONE TABLET BY MOUTH FOUR TIMES A DAY AS NEEDED FOR ANXIETY 240 tablet 0  . amLODipine (NORVASC) 5 MG tablet TAKE 1/2 TABLET BY MOUTH IN THE MORNING AND 1 TABLET AT BEDTIME (Patient taking differently: TAKES 1 TABLET AT  BEDTIME) 135 tablet 3  . butalbital-aspirin-caffeine-codeine (FIORINAL WITH CODEINE) 50-325-40-30 MG capsule Take 1 capsule by mouth every 6 (six) hours as needed for pain. 21 capsule 0  . cetirizine (ZYRTEC) 10 MG tablet Take 10 mg by mouth daily.    . fenofibrate 160 MG tablet Take 1 tablet (160 mg total) by mouth daily. 90 tablet 0  . fluticasone (FLONASE) 50 MCG/ACT nasal spray Place 1 spray into both nostrils daily.    Marland Kitchen lamoTRIgine (LAMICTAL) 100 MG tablet Take 1 tablet (100 mg total) by mouth daily. 90 tablet 0  . lisinopril (ZESTRIL) 20 MG tablet TAKE ONE TABLET BY MOUTH DAILY 90 tablet 0  . mycophenolate (CELLCEPT) 500 MG tablet Take 500 mg by mouth 2 (two) times daily.     . pantoprazole (PROTONIX) 40 MG tablet TAKE ONE TABLET BY MOUTH TWICE A DAY (Patient taking differently: Take 40 mg by mouth daily.) 120 tablet 0  . PARoxetine (PAXIL) 40 MG tablet Take 1 tablet (40 mg total) by mouth daily. 90 tablet 0  . pravastatin (PRAVACHOL) 40 MG tablet Take 1 tablet (40 mg total) by mouth daily. 90 tablet 3  . predniSONE (DELTASONE) 2.5 MG tablet Take 2.5 mg by mouth daily with breakfast.  11  . promethazine (PHENERGAN) 25 MG tablet TAKE ONE TABLET BY MOUTH EVERY 8 HOURS AS NEEDED FOR FOR NAUSEA AND VOMITING 30 tablet 0  . cyanocobalamin (,VITAMIN B-12,) 1000 MCG/ML injection Inject 1 mL into the skin once a week for 4 weeks, then once a month for 5 months. (Patient not taking: Reported on 11/30/2020) 1 mL 8  . ondansetron (ZOFRAN-ODT) 4 MG disintegrating tablet Take 4 mg by mouth every 8 (eight) hours as needed. (Patient not taking: No sig reported)    . OXYGEN Inhale 2 L into the lungs as needed (SOB).    . pregabalin (LYRICA) 75 MG capsule Take 1 capsule (75 mg total) by mouth 2 (two) times daily. (Patient not taking: Reported on 11/30/2020) 60 capsule 1  . SYRINGE-NEEDLE, DISP, 3 ML (B-D 3CC LUER-LOK SYR 25GX1") 25G X 1" 3 ML MISC USE TO ADMINSTER B12 INJECTIONS 1 TIME A WEEK FOR 4 WEEKS THEN  ONCE MONTHLY FOR 5 MONTH 10 each 0  . temazepam (RESTORIL) 30 MG capsule Take 1 capsule (30 mg total) by mouth at bedtime. 90 capsule 1   No current facility-administered medications for this visit.    Medication Side Effects: None  Allergies:  Allergies  Allergen Reactions  . Lisinopril     COUGH   . Trazodone And Nefazodone     Paresthesias, weakness  . Trintellix [Vortioxetine]   . Macrobid [Nitrofurantoin Monohyd Macro] Diarrhea    Diarrhea, GI upset  . Sulfa Antibiotics Rash    Past Medical History:  Diagnosis Date  . Allergy   . Anxiety   . Arthritis    KNEES,BACK,HIPS  . Depression   . Fibromyalgia   . GERD (gastroesophageal reflux disease)   . Hyperlipidemia   . Hypertension   . IBS (irritable bowel syndrome)   . Interstitial lung disease ([place])   . Pulmonary embolism ([place]) 09/2016   provoked- s/p rotator cuff repair  . Sleep apnea     Family History  Problem Relation Age of Onset  . Diabetes Mother   . Heart disease Mother   . Anxiety disorder Mother   . Depression Mother   . Kidney disease Mother   . Diabetes Father   . Heart disease Father   . Anxiety disorder Father   . Depression Father   . Diabetes Paternal Grandmother   . Cystic fibrosis Son   . Alcohol abuse Son   . Drug abuse Son   . Anxiety disorder Son   . Depression Son   . Breast cancer Maternal Aunt   . Breast cancer Maternal Aunt     Social History   Socioeconomic History  . Marital status: Widowed    Spouse name: Not on file  . Number of children: 2  . Years of education: 39  . Highest education level: High school graduate  Occupational History  . 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 Smoking status: Former Research scientist (life sciences)  . Smokeless tobacco: Never Used  . Tobacco comment: smoked 2 years in late teens about 8-10 cigs daily.   Vaping Use  . Vaping Use: Never used  Substance and Sexual Activity  . Alcohol use: No    Alcohol/week: 0.0 standard  drinks  . Drug use: No  . Sexual activity: Not on file  Other Topics Concern  . Not on file  Social History Narrative   Lives alone, widowed. Has one dtr. in [place]. [place] and one son in [place]   Social Determinants of Health   Financial Resource Strain: Low Risk   . Difficulty of Paying Living Expenses: Not hard at all  Food Insecurity: No Food Insecurity  . Worried About Charity fundraiser in the Last Year: Never true  . Ran Out of Food in the Last Year: Never true  Transportation Needs: No Transportation Needs  . Lack of Transportation (Medical): No  . Lack of Transportation (Non-Medical): No  Physical Activity: Insufficiently Active  . Days of Exercise per Week: 4 days  . Minutes of Exercise per Session: 20 min  Stress: No Stress Concern Present  . Feeling of Stress : Only a little  Social Connections: Socially Isolated  . Frequency of Communication with Friends and Family: More than three times a week  . Frequency of Social Gatherings with Friends and Family: Never  . Attends Religious Services: Never  . Active Member of Clubs or Organizations: No  . Attends Archivist Meetings: Never  . Marital Status: Widowed  Intimate Partner Violence: Not At Risk  . Fear of Current or Ex-Partner: No  . Emotionally Abused: No  . Physically Abused: No  . Sexually Abused: No    Past Medical History, Surgical history, Social history, and Family history were reviewed and updated as appropriate.   Please see review of systems for further details on the patient's review from today.   Objective:   Physical Exam:  There were no vitals taken for this visit.  Physical Exam Neurological:     Mental Status: She is alert and oriented to person, place, and time.     Cranial Nerves: No dysarthria.  Psychiatric:        Attention and Perception: Attention and perception normal.        Mood and Affect: Mood is anxious and depressed.        Speech: Speech normal.         Behavior: Behavior is cooperative.        Thought  Content: Thought content normal. Thought content is not paranoid or delusional. Thought content does not include homicidal or suicidal ideation. Thought content does not include homicidal or suicidal plan.        Cognition and Memory: Cognition and memory normal.        Judgment: Judgment normal.     Comments: Insight intact     Lab Review:     Component Value Date/Time   NA 137 10/09/2020 1447   NA 138 07/14/2020 1020   K 4.0 10/09/2020 1447   CL 102 10/09/2020 1447   CO2 28 10/09/2020 1447   GLUCOSE 102 (H) 10/09/2020 1447   BUN 13 10/09/2020 1447   BUN 13 07/14/2020 1020   CREATININE 0.81 10/09/2020 1447   CALCIUM 9.5 10/09/2020 1447   PROT 7.1 10/09/2020 1447   ALBUMIN 4.5 10/09/2020 1447   AST 13 10/09/2020 1447   ALT 10 10/09/2020 1447   ALKPHOS 74 10/09/2020 1447   BILITOT 0.4 10/09/2020 1447   GFRNONAA 77 07/14/2020 1020   GFRAA 89 07/14/2020 1020       Component Value Date/Time   WBC 7.8 10/09/2020 1447   RBC 4.88 10/09/2020 1447   HGB 13.0 10/09/2020 1447   HCT 39.4 10/09/2020 1447   PLT 262.0 10/09/2020 1447   MCV 80.8 10/09/2020 1447   MCH 26.1 05/17/2020 1146   MCHC 33.0 10/09/2020 1447   RDW 14.1 10/09/2020 1447   LYMPHSABS 1.6 10/09/2020 1447   MONOABS 0.4 10/09/2020 1447   EOSABS 0.1 10/09/2020 1447   BASOSABS 0.1 10/09/2020 1447    No results found for: POCLITH, LITHIUM   No results found for: PHENYTOIN, PHENOBARB, VALPROATE, CBMZ   .res Assessment: Plan:    Patient seen for 30 minutes and time spent counseling the patient regarding possible treatment options for depression.  Discussed her questions about Ketamine/Spravato.  Will add patient to clinic waitlist for Spravato.  Discussed that Anette Guarneri has seemed to be helpful for her depression in the past and that resuming Latuda may help with depressive signs and symptoms. Will continue Paxil 40 mg daily for anxiety and depression.  Discussed that  she may continue to notice an improvement in mood and anxiety signs and symptoms after increased dose approximately 4 weeks ago. Continue lamotrigine 100 mg daily for mood signs and symptoms. Continue alprazolam 1 mg 4 times daily as needed for anxiety. Continue temazepam 30 mg at bedtime for insomnia. Recommend continuing psychotherapy with Beckey Downing, LCAS. Patient to follow-up with this provider in 1 to 2 months or sooner if clinically indicated. Patient advised to contact office with any questions, adverse effects, or acute worsening in signs and symptoms.  Shakedra was seen today for depression and anxiety.  Diagnoses and all orders for this visit:  Severe episode of recurrent major depressive disorder, without psychotic features ([place])  Primary insomnia -     temazepam (RESTORIL) 30 MG capsule; Take 1 capsule (30 mg total) by mouth at bedtime.  Generalized anxiety disorder    Please see After Visit Summary for patient specific instructions.  Future Appointments  Date Time Provider [place]  02/22/2021  4:00 PM Thayer Headings, PMHNP CP-CP None    No orders of the defined types were placed in this encounter.     -------------------------------

## 2021-01-12 ENCOUNTER — Ambulatory Visit: Payer: Medicare HMO | Admitting: Family Medicine

## 2021-01-12 ENCOUNTER — Telehealth: Payer: Self-pay | Admitting: Psychiatry

## 2021-01-12 NOTE — Telephone Encounter (Signed)
Please review

## 2021-01-12 NOTE — Telephone Encounter (Signed)
Can discuss at next visit. She can be placed on cancellation list if she would like.

## 2021-01-12 NOTE — Telephone Encounter (Signed)
Next visit is 02/22/21. Emil had a virtual visit with Janett Billow yesterday. She forgot to tell her that Beckey Downing, her counselor thinks she might have ADD and might need to be tested. She completely forgot to tell Janett Billow on the phone yesterday. Could she be called back at [phone].

## 2021-01-14 ENCOUNTER — Other Ambulatory Visit: Payer: Self-pay | Admitting: Family Medicine

## 2021-01-16 ENCOUNTER — Telehealth: Payer: Medicare HMO | Admitting: Psychiatry

## 2021-01-17 ENCOUNTER — Encounter: Payer: Self-pay | Admitting: Family Medicine

## 2021-01-17 NOTE — Unmapped (Signed)
[facility] Refill Coordination Note    Specialty Medication(s) to be Shipped:   General Specialty: mycophenolate 500mg     Other medication(s) to be shipped: No additional medications requested for fill at this time     Amber Bush, DOB: 1955/12/05  Phone: [phone] (home)       All above HIPAA information was verified with patient.     Was a Nurse, learning disability used for this call? No    Completed refill call assessment today to schedule patient's medication shipment from the [facility] [phone]).  All relevant notes have been reviewed.     Specialty medication(s) and dose(s) confirmed: Regimen is correct and unchanged.   Changes to medications: [name] reports no changes at this time.  Changes to insurance: No  New side effects reported not previously addressed with a pharmacist or physician: None reported  Questions for the pharmacist: No    Confirmed patient received a Conservation officer, historic buildings and a Surveyor, mining with first shipment. The patient will receive a drug information handout for each medication shipped and additional FDA Medication Guides as required.       DISEASE/MEDICATION-SPECIFIC INFORMATION        N/A    SPECIALTY MEDICATION ADHERENCE     Medication Adherence    Patient reported X missed doses in the last month: 0  Specialty Medication: Mycophenolate 500mg   Patient is on additional specialty medications: No  Patient is on more than two specialty medications: No              Were doses missed due to medication being on hold? No    Mycophenolate 500 mg: 10-12 days of medicine on hand     REFERRAL TO PHARMACIST     Referral to the pharmacist: Not needed      [facility]     Shipping address confirmed in Epic.     Delivery Scheduled: Yes, Expected medication delivery date: 01/24/21.     Medication will be delivered via UPS to the prescription address in Epic WAM.    Nancy Nordmann [facility] Specialty Technician

## 2021-01-18 DIAGNOSIS — G4733 Obstructive sleep apnea (adult) (pediatric): Secondary | ICD-10-CM | POA: Diagnosis not present

## 2021-01-23 MED FILL — MYCOPHENOLATE MOFETIL 500 MG TABLET: ORAL | 30 days supply | Qty: 60 | Fill #10

## 2021-02-01 DIAGNOSIS — G4733 Obstructive sleep apnea (adult) (pediatric): Secondary | ICD-10-CM | POA: Diagnosis not present

## 2021-02-01 DIAGNOSIS — R062 Wheezing: Secondary | ICD-10-CM | POA: Diagnosis not present

## 2021-02-01 DIAGNOSIS — J849 Interstitial pulmonary disease, unspecified: Secondary | ICD-10-CM | POA: Diagnosis not present

## 2021-02-05 DIAGNOSIS — G4733 Obstructive sleep apnea (adult) (pediatric): Secondary | ICD-10-CM | POA: Diagnosis not present

## 2021-02-07 ENCOUNTER — Encounter: Payer: Self-pay | Admitting: Family Medicine

## 2021-02-07 DIAGNOSIS — L578 Other skin changes due to chronic exposure to nonionizing radiation: Secondary | ICD-10-CM | POA: Diagnosis not present

## 2021-02-07 DIAGNOSIS — B360 Pityriasis versicolor: Secondary | ICD-10-CM | POA: Diagnosis not present

## 2021-02-14 NOTE — Unmapped (Signed)
02/14/21 this patient said she just opened an entire new bottle of Mycophenolate and that's an 30 days supply, I will call her back on 03/07/21. sed

## 2021-02-18 DIAGNOSIS — G4733 Obstructive sleep apnea (adult) (pediatric): Secondary | ICD-10-CM | POA: Diagnosis not present

## 2021-02-22 ENCOUNTER — Encounter: Payer: Self-pay | Admitting: Psychiatry

## 2021-02-22 ENCOUNTER — Telehealth (INDEPENDENT_AMBULATORY_CARE_PROVIDER_SITE_OTHER): Payer: Medicare HMO | Admitting: Psychiatry

## 2021-02-22 VITALS — BP 140/70 | HR 97

## 2021-02-22 DIAGNOSIS — R69 Illness, unspecified: Secondary | ICD-10-CM | POA: Diagnosis not present

## 2021-02-22 DIAGNOSIS — F330 Major depressive disorder, recurrent, mild: Secondary | ICD-10-CM

## 2021-02-22 DIAGNOSIS — F902 Attention-deficit hyperactivity disorder, combined type: Secondary | ICD-10-CM

## 2021-02-22 DIAGNOSIS — F332 Major depressive disorder, recurrent severe without psychotic features: Secondary | ICD-10-CM

## 2021-02-22 DIAGNOSIS — F411 Generalized anxiety disorder: Secondary | ICD-10-CM

## 2021-02-22 MED ORDER — ALPRAZOLAM 1 MG PO TABS: ORAL_TABLET | ORAL | 0 refills | Status: DC

## 2021-02-22 MED ORDER — LAMOTRIGINE 100 MG PO TABS: 100.0000 mg | ORAL_TABLET | Freq: Every day | ORAL | 0 refills | Status: DC

## 2021-02-22 MED ORDER — METHYLPHENIDATE HCL 10 MG PO TABS: ORAL_TABLET | ORAL | 0 refills | Status: DC

## 2021-02-22 MED ORDER — PAROXETINE HCL 40 MG PO TABS: 40.0000 mg | ORAL_TABLET | Freq: Every day | ORAL | 0 refills | Status: DC
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
## 2021-02-26 ENCOUNTER — Telehealth: Payer: Self-pay

## 2021-02-26 MED ORDER — BUTALBITAL-ASA-CAFF-CODEINE 50-325-40-30 MG PO CAPS: 1.0000 | ORAL_CAPSULE | Freq: Four times a day (QID) | ORAL | 0 refills | Status: DC | PRN

## 2021-02-26 NOTE — Telephone Encounter (Signed)
Prescription sent as requested.

## 2021-02-26 NOTE — Addendum Note (Signed)
Addended by: Midge Minium on: 02/26/2021 12:38 PM   Modules accepted: Orders

## 2021-02-26 NOTE — Telephone Encounter (Signed)
Requesting: Butalbital 50-325-40-30mg  Contract: UDS: Last Visit:01/12/21 Next Visit:n/a Last Refill:12/01/20 21 caps 0 refills  Please Advise

## 2021-03-02 ENCOUNTER — Telehealth: Payer: Self-pay | Admitting: Psychiatry

## 2021-03-02 DIAGNOSIS — F411 Generalized anxiety disorder: Secondary | ICD-10-CM

## 2021-03-02 DIAGNOSIS — F330 Major depressive disorder, recurrent, mild: Secondary | ICD-10-CM

## 2021-03-02 NOTE — Telephone Encounter (Signed)
Pt called reporting Paxil 30 mg was not enough. Increased to 40 mg, now her mouth stays so dry she can't stand it. Requesting to decrease back to 30 mg. Contact # [phone]. Ask nurse to return call today. Advised JC out of office until Monday

## 2021-03-03 ENCOUNTER — Telehealth: Payer: Medicare HMO | Admitting: Nurse Practitioner

## 2021-03-03 ENCOUNTER — Encounter: Payer: Self-pay | Admitting: Nurse Practitioner

## 2021-03-03 DIAGNOSIS — R059 Cough, unspecified: Secondary | ICD-10-CM | POA: Diagnosis not present

## 2021-03-03 DIAGNOSIS — J849 Interstitial pulmonary disease, unspecified: Secondary | ICD-10-CM | POA: Diagnosis not present

## 2021-03-03 MED ORDER — PREDNISONE 20 MG PO TABS: 40.0000 mg | ORAL_TABLET | Freq: Every day | ORAL | 0 refills | Status: AC

## 2021-03-03 MED ORDER — AZITHROMYCIN 250 MG PO TABS: ORAL_TABLET | ORAL | 0 refills | Status: DC

## 2021-03-03 NOTE — Progress Notes (Signed)
Virtual Visit Consent   Stacey Alvarez, you are scheduled for a virtual visit with Stacey Alvarez, [place], a [facility] provider, today.     Just as with appointments in the office, your consent must be obtained to participate.  Your consent will be active for this visit and any virtual visit you may have with one of our providers in the next 365 days.     If you have a MyChart account, a copy of this consent can be sent to you electronically.  All virtual visits are billed to your insurance company just like a traditional visit in the office.    As this is a virtual visit, video technology does not allow for your provider to perform a traditional examination.  This may limit your provider's ability to fully assess your condition.  If your provider identifies any concerns that need to be evaluated in person or the need to arrange testing (such as labs, EKG, etc.), we will make arrangements to do so.     Although advances in technology are sophisticated, we cannot ensure that it will always work on either your end or our end.  If the connection with a video visit is poor, the visit may have to be switched to a telephone visit.  With either a video or telephone visit, we are not always able to ensure that we have a secure connection.     I need to obtain your verbal consent now.   Are you willing to proceed with your visit today? yes   Stacey Alvarez has provided verbal consent on 03/03/2021 for a virtual visit (telephone). Patient unable to connect vide   Stacey Hassell Done, FNP   Date: 03/03/2021 8:54 AM   Virtual Visit via Video Note   I, [place], connected withNAME@ ([id number], 01/10/1956) on 03/03/21 at  9:00 AM EDT by a video-enabled telemedicine application and verified that I am speaking with the correct person using two identifiers.  Location: Patient: Virtual Visit Location Patient: Home Provider: Virtual Visit Location Provider: Mobile   I discussed the  limitations of evaluation and management by telemedicine and the availability of in person appointments. The patient expressed understanding and agreed to proceed.    History of Present Illness: Stacey Alvarez is a 65 y.o. who identifies as a female who was assigned female at birth, and is being seen today for cough and sinus congestion.  HPI:   Patient states that she has interstitual lung disease. She has become SOB with heat. She is also coughing up a lot of mucus. Has chills, has fever over 101. Has sore throat. Has needed to use her supplemental oxygen daily all day. She has been taking mucinx with no relief. This staryted over a week ago. She has not been tested for covid.  Review of Systems  Constitutional:  Positive for chills, fever and malaise/fatigue.  HENT:  Positive for congestion and sore throat.   Respiratory:  Positive for cough, sputum production and shortness of breath.   Musculoskeletal:  Positive for myalgias.  Neurological:  Positive for focal weakness and headaches.    Problems:  Patient Active Problem List   Diagnosis Date Noted   History of pulmonary embolus (PE) 06/23/2019   Dyslipidemia 06/23/2019   Moderate recurrent major depression ([place]) 06/14/2019   Family history of cerebral aneurysm 10/29/2018   Syncope 10/29/2018   Closed left ankle fracture 09/13/2018   Vertigo 08/26/2018   ILD (interstitial lung disease) ([place]) 10/01/2016   Atherosclerosis of native  coronary artery without angina pectoris 10/01/2016   Chronic cough 04/05/2016   Incidental lung nodule, > 38mm and < 36mm 04/05/2016   History of sleep apnea 04/05/2016   Back pain 02/07/2016   Morbid obesity ([place]) 11/24/2015   Hyperlipidemia 05/24/2015   Diverticulitis 01/09/2015   Routine general medical examination at a health care facility 11/21/2014   Essential hypertension 10/28/2013   OSA (obstructive sleep apnea) 10/28/2013   Insulin resistance 10/28/2013   GERD (gastroesophageal reflux  disease) 10/28/2013   Postmenopausal HRT (hormone replacement therapy) 10/28/2013    Allergies:  Allergies  Allergen Reactions   Lisinopril     COUGH    Trazodone And Nefazodone     Paresthesias, weakness   Trintellix [Vortioxetine]    Macrobid [Nitrofurantoin Monohyd Macro] Diarrhea    Diarrhea, GI upset   Sulfa Antibiotics Rash   Medications:  Current Outpatient Medications:    [START ON 03/20/2021] ALPRAZolam (XANAX) 1 MG tablet, TAKE ONE TABLET BY MOUTH FOUR TIMES A DAY AS NEEDED FOR ANXIETY, Disp: 360 tablet, Rfl: 0   amLODipine (NORVASC) 5 MG tablet, TAKE 1/2 TABLET BY MOUTH IN THE MORNING AND 1 TABLET AT BEDTIME (Patient taking differently: TAKES 1 TABLET AT BEDTIME), Disp: 135 tablet, Rfl: 3   butalbital-aspirin-caffeine-codeine (FIORINAL WITH CODEINE) 50-325-40-30 MG capsule, Take 1 capsule by mouth every 6 (six) hours as needed for pain., Disp: 21 capsule, Rfl: 0   cetirizine (ZYRTEC) 10 MG tablet, Take 10 mg by mouth daily., Disp: , Rfl:    cyanocobalamin (,VITAMIN B-12,) 1000 MCG/ML injection, Inject 1 mL into the skin once a week for 4 weeks, then once a month for 5 months. (Patient not taking: Reported on 11/30/2020), Disp: 1 mL, Rfl: 8   fenofibrate 160 MG tablet, Take 1 tablet (160 mg total) by mouth daily., Disp: 90 tablet, Rfl: 0   fluticasone (FLONASE) 50 MCG/ACT nasal spray, Place 1 spray into both nostrils daily., Disp: , Rfl:    lamoTRIgine (LAMICTAL) 100 MG tablet, Take 1 tablet (100 mg total) by mouth daily., Disp: 90 tablet, Rfl: 0   lisinopril (ZESTRIL) 20 MG tablet, TAKE ONE TABLET BY MOUTH DAILY, Disp: 90 tablet, Rfl: 0   methylphenidate (RITALIN) 10 MG tablet, Take 1/2-1 tablet twice daily as needed, Disp: 60 tablet, Rfl: 0   mycophenolate (CELLCEPT) 500 MG tablet, Take 500 mg by mouth 2 (two) times daily. , Disp: , Rfl:    OXYGEN, Inhale 2 L into the lungs as needed (SOB)., Disp: , Rfl:    pantoprazole (PROTONIX) 40 MG tablet, TAKE ONE TABLET BY MOUTH TWICE A  DAY (Patient taking differently: Take 40 mg by mouth daily.), Disp: 120 tablet, Rfl: 0   PARoxetine (PAXIL) 40 MG tablet, Take 1 tablet (40 mg total) by mouth daily., Disp: 90 tablet, Rfl: 0   pravastatin (PRAVACHOL) 40 MG tablet, Take 1 tablet (40 mg total) by mouth daily., Disp: 90 tablet, Rfl: 3   predniSONE (DELTASONE) 2.5 MG tablet, Take 2.5 mg by mouth daily with breakfast., Disp: , Rfl: 11   pregabalin (LYRICA) 75 MG capsule, Take 1 capsule (75 mg total) by mouth 2 (two) times daily. (Patient not taking: No sig reported), Disp: 60 capsule, Rfl: 1   promethazine (PHENERGAN) 25 MG tablet, TAKE ONE TABLET BY MOUTH EVERY 8 HOURS AS NEEDED FOR FOR NAUSEA AND VOMITING, Disp: 30 tablet, Rfl: 0   SYRINGE-NEEDLE, DISP, 3 ML (B-D 3CC LUER-LOK SYR 25GX1") 25G X 1" 3 ML MISC, USE TO ADMINSTER B12 INJECTIONS  1 TIME A WEEK FOR 4 WEEKS THEN ONCE MONTHLY FOR 5 MONTH, Disp: 10 each, Rfl: 0   temazepam (RESTORIL) 30 MG capsule, Take 1 capsule (30 mg total) by mouth at bedtime., Disp: 90 capsule, Rfl: 1  Observations/Objective: Patient is well-developed, well-nourished in no acute distress.  Resting comfortably  at home.  Head is normocephalic, atraumatic.  No labored breathing.  Speech is clear and coherent with logical content.  Patient is alert and oriented at baseline.  Deep cough  noted  Assessment and Plan:  Stacey Alvarez in today with chief complaint of No chief complaint on file.   1. Cough  2. Interstitial lung disease ([place]) 1. Take meds as prescribed 2. Use a cool mist humidifier especially during the winter months and when heat has been humid. 3. Use saline nose sprays frequently 4. Saline irrigations of the nose can be very helpful if Alvarez frequently.  * 4X daily for 1 week*  * Use of a nettie pot can be helpful with this. Follow directions with this* 5. Drink plenty of fluids 6. Keep thermostat turn down low 7.For any cough or congestion  Use plain Mucinex- regular strength or max  strength is fine   * Children- consult with Pharmacist for dosing 8. For fever or aces or pains- take tylenol or ibuprofen appropriate for age and weight.  * for fevers greater than 101 orally you may alternate ibuprofen and tylenol every  3 hours. If still has fever tomorrow  go to the ED   Meds ordered this encounter  Medications   azithromycin (ZITHROMAX Z-PAK) 250 MG tablet    Sig: As directed    Dispense:  6 tablet    Refill:  0    Order Specific Question:   Supervising Provider    Answer:   Sabra Heck, BRIAN [[id number]]   predniSONE (DELTASONE) 20 MG tablet    Sig: Take 2 tablets (40 mg total) by mouth daily with breakfast for 5 days. 2 po daily for 5 days    Dispense:  10 tablet    Refill:  0    Order Specific Question:   Supervising Provider    Answer:   Noemi Chapel [[id number]]        Follow Up Instructions: I discussed the assessment and treatment plan with the patient. The patient was provided an opportunity to ask questions and all were answered. The patient agreed with the plan and demonstrated an understanding of the instructions.  A copy of instructions were sent to the patient via MyChart.  The patient was advised to call back or seek an in-person evaluation if the symptoms worsen or if the condition fails to improve as anticipated.  Time:  I spent 12 minutes with the patient via telehealth technology discussing the above problems/concerns.    Stacey Hassell Done, FNP

## 2021-03-04 DIAGNOSIS — J849 Interstitial pulmonary disease, unspecified: Secondary | ICD-10-CM | POA: Diagnosis not present

## 2021-03-04 DIAGNOSIS — G4733 Obstructive sleep apnea (adult) (pediatric): Secondary | ICD-10-CM | POA: Diagnosis not present

## 2021-03-04 DIAGNOSIS — R062 Wheezing: Secondary | ICD-10-CM | POA: Diagnosis not present

## 2021-03-05 NOTE — Telephone Encounter (Signed)
Tried reaching pt about her decreasing Paxil to 30 mg again due to extreme mouth dryness. Instructed to call back with an update.

## 2021-03-06 MED ORDER — PAROXETINE HCL 30 MG PO TABS
30.0000 mg | ORAL_TABLET | Freq: Every day | ORAL | 0 refills | Status: DC
Start: 2021-03-06 — End: 2021-06-08

## 2021-03-06 MED ORDER — PAROXETINE 30 MG TABLET
0 days
Start: 2021-03-06 — End: ?

## 2021-03-06 NOTE — Telephone Encounter (Signed)
Left message for patient that message was received about having severe dry mouth with Paxil 40 mg daily.  Advised that prescription would be sent for Paxil 30 mg tablets.  Requested that she contact office if she has any other questions or concerns.

## 2021-03-06 NOTE — Addendum Note (Signed)
Addended by: Sharyl Nimrod on: 03/06/2021 04:42 PM   Modules accepted: Orders

## 2021-03-08 ENCOUNTER — Telehealth: Payer: Self-pay

## 2021-03-08 NOTE — Chronic Care Management (AMB) (Signed)
Chronic Care Management Pharmacy Assistant   Name: Stacey Alvarez  MRN: [id number] DOB: 1956/01/20  Reason for Encounter: Hypertension Disease State/ CPP Visit Reschedule Call  Recent office visits:  10/09/20- Annye Asa, MD- seen for dysuria, chronic conditions addressed, short course cephalexin 500 mg x 5 days, schedule physical for follow up 6 months  Recent consult visits:  03/03/21- Chevis Pretty, [place]( Video Visit Telehealth)- seen for cough and sinus congestion, short course prednisone 40 mg x 5 days, short course azithromycin 250 mg as directed, follow up as needed  Hospital visits:  None in previous 6 months  Medications: Outpatient Encounter Medications as of 03/08/2021  Medication Sig   [START ON 03/20/2021] ALPRAZolam (XANAX) 1 MG tablet TAKE ONE TABLET BY MOUTH FOUR TIMES A DAY AS NEEDED FOR ANXIETY   amLODipine (NORVASC) 5 MG tablet TAKE 1/2 TABLET BY MOUTH IN THE MORNING AND 1 TABLET AT BEDTIME (Patient taking differently: TAKES 1 TABLET AT BEDTIME)   azithromycin (ZITHROMAX Z-PAK) 250 MG tablet As directed   butalbital-aspirin-caffeine-codeine (FIORINAL WITH CODEINE) 50-325-40-30 MG capsule Take 1 capsule by mouth every 6 (six) hours as needed for pain.   cetirizine (ZYRTEC) 10 MG tablet Take 10 mg by mouth daily.   cyanocobalamin (,VITAMIN B-12,) 1000 MCG/ML injection Inject 1 mL into the skin once a week for 4 weeks, then once a month for 5 months. (Patient not taking: Reported on 11/30/2020)   fenofibrate 160 MG tablet Take 1 tablet (160 mg total) by mouth daily.   fluticasone (FLONASE) 50 MCG/ACT nasal spray Place 1 spray into both nostrils daily.   lamoTRIgine (LAMICTAL) 100 MG tablet Take 1 tablet (100 mg total) by mouth daily.   lisinopril (ZESTRIL) 20 MG tablet TAKE ONE TABLET BY MOUTH DAILY   methylphenidate (RITALIN) 10 MG tablet Take 1/2-1 tablet twice daily as needed   mycophenolate (CELLCEPT) 500 MG tablet Take 500 mg by mouth 2 (two) times  daily.    OXYGEN Inhale 2 L into the lungs as needed (SOB).   pantoprazole (PROTONIX) 40 MG tablet TAKE ONE TABLET BY MOUTH TWICE A DAY (Patient taking differently: Take 40 mg by mouth daily.)   PARoxetine (PAXIL) 30 MG tablet Take 1 tablet (30 mg total) by mouth daily.   PARoxetine (PAXIL) 40 MG tablet Take 1 tablet (40 mg total) by mouth daily.   pravastatin (PRAVACHOL) 40 MG tablet Take 1 tablet (40 mg total) by mouth daily.   predniSONE (DELTASONE) 20 MG tablet Take 2 tablets (40 mg total) by mouth daily with breakfast for 5 days. 2 po daily for 5 days   pregabalin (LYRICA) 75 MG capsule Take 1 capsule (75 mg total) by mouth 2 (two) times daily. (Patient not taking: No sig reported)   promethazine (PHENERGAN) 25 MG tablet TAKE ONE TABLET BY MOUTH EVERY 8 HOURS AS NEEDED FOR FOR NAUSEA AND VOMITING   SYRINGE-NEEDLE, DISP, 3 ML (B-D 3CC LUER-LOK SYR 25GX1") 25G X 1" 3 ML MISC USE TO ADMINSTER B12 INJECTIONS 1 TIME A WEEK FOR 4 WEEKS THEN ONCE MONTHLY FOR 5 MONTH   temazepam (RESTORIL) 30 MG capsule Take 1 capsule (30 mg total) by mouth at bedtime.   No facility-administered encounter medications on file as of 03/08/2021.   Reviewed chart prior to disease state call. Spoke with patient regarding BP  Recent Office Vitals: BP Readings from Last 3 Encounters:  10/09/20 130/70  06/23/20 117/70  05/17/20 138/75   Pulse Readings from Last 3 Encounters:  10/09/20  92  06/23/20 86  05/17/20 99    Wt Readings from Last 3 Encounters:  10/09/20 244 lb 6.4 oz (110.9 kg)  06/26/20 240 lb (108.9 kg)  06/23/20 240 lb (108.9 kg)     Kidney Function Lab Results  Component Value Date/Time   CREATININE 0.81 10/09/2020 02:47 PM   CREATININE 0.80 07/14/2020 10:20 AM   GFR 75.39 10/09/2020 02:47 PM   GFRNONAA 77 07/14/2020 10:20 AM   GFRAA 89 07/14/2020 10:20 AM   Several unsuccessful attempts made to contact patient for hypertension call  Current antihypertensive regimen:  Amlodipine 5 mg-  TAKE 1/2 TABLET BY MOUTH IN THE MORNING AND 1 TABLET AT BEDTIME, Normal Patient taking differently: TAKES 1 TABLET AT BEDTIME lisinopril 20 MG tablet pravastatin 40 MG tablet fenofibrate 160 MG tablet  How often are you checking your Blood Pressure?   Current home BP readings:   What recent interventions/DTPs have been made by any provider to improve Blood Pressure control since last CPP Visit:   Any recent hospitalizations or ED visits since last visit with CPP?   What diet changes have been made to improve Blood Pressure Control?    What exercise is being done to improve your Blood Pressure Control?    Adherence Review: Is the patient currently on ACE/ARB medication? Yes Does the patient have >5 day gap between last estimated fill dates? Yes fenofibrate 160 MG tablet- 90 DS last filled 12/22/20 Amlodipine 5 mg- 90 DS last filled 09/28/20  Star Rating Drugs: Lisinopril 20 MG tablet- 90 DS last filled 01/15/21 pravastatin 40 MG tablet- 90 DS last filled 12/22/20  Wilford Sports CPA, CMA

## 2021-03-12 NOTE — Unmapped (Signed)
[facility] Refill Coordination Note    Specialty Medication(s) to be Shipped:   Transplant: mycophenolate mofetil 500mg     Other medication(s) to be shipped: No additional medications requested for fill at this time     Amber Bush, DOB: June 19, 1956  Phone: [phone] (home)       All above HIPAA information was verified with patient.     Was a Nurse, learning disability used for this call? No    Completed refill call assessment today to schedule patient's medication shipment from the [facility] [phone]).  All relevant notes have been reviewed.     Specialty medication(s) and dose(s) confirmed: Regimen is correct and unchanged.   Changes to medications: Amber Bush reports no changes at this time.  Changes to insurance: No  New side effects reported not previously addressed with a pharmacist or physician: None reported  Questions for the pharmacist: No    Confirmed patient received a Conservation officer, historic buildings and a Surveyor, mining with first shipment. The patient will receive a drug information handout for each medication shipped and additional FDA Medication Guides as required.       DISEASE/MEDICATION-SPECIFIC INFORMATION        N/A    SPECIALTY MEDICATION ADHERENCE     Medication Adherence    Patient reported X missed doses in the last month: 0  Specialty Medication: Mycophenolate  Patient is on additional specialty medications: No  Patient is on more than two specialty medications: No  Informant: patient              Were doses missed due to medication being on hold? No    Mycophenolate 500 mg: 14 days of medicine on hand       REFERRAL TO PHARMACIST     Referral to the pharmacist: Not needed      [facility]     Shipping address confirmed in Epic.     Delivery Scheduled: Yes, Expected medication delivery date: 03/22/21.     Medication will be delivered via UPS to the prescription address in Epic [place].    Amber Bush   [facility] Specialty Technician

## 2021-03-12 NOTE — Unmapped (Signed)
The [facility] has made a second and final attempt to reach this patient to refill the following medication:Mycophenolate.      We have left voicemails on the following phone numbers: [phone], [phone].    Dates contacted: 02/14/21, 03/07/21, 03/12/21  Last scheduled delivery: 01/23/21    The patient may be at risk of non-compliance with this medication. The patient should call the [facility] at [phone] (option 4) to refill medication.    Darrelyn Morro Celedonio Savage   [facility]

## 2021-03-14 ENCOUNTER — Encounter: Payer: Self-pay | Admitting: *Deleted

## 2021-03-20 DIAGNOSIS — G4733 Obstructive sleep apnea (adult) (pediatric): Secondary | ICD-10-CM | POA: Diagnosis not present

## 2021-03-21 MED FILL — MYCOPHENOLATE MOFETIL 500 MG TABLET: ORAL | 30 days supply | Qty: 60 | Fill #11

## 2021-03-22 ENCOUNTER — Other Ambulatory Visit: Payer: Self-pay

## 2021-03-22 DIAGNOSIS — E785 Hyperlipidemia, unspecified: Secondary | ICD-10-CM

## 2021-03-22 MED ORDER — FENOFIBRATE 160 MG PO TABS: 160.0000 mg | ORAL_TABLET | Freq: Every day | ORAL | 0 refills | Status: DC

## 2021-03-27 ENCOUNTER — Ambulatory Visit: Admit: 2021-03-27 | Discharge: 2021-03-27 | Payer: MEDICARE | Attending: Internal Medicine | Primary: Internal Medicine

## 2021-03-27 ENCOUNTER — Ambulatory Visit: Admit: 2021-03-27 | Discharge: 2021-03-27 | Payer: MEDICARE

## 2021-03-27 DIAGNOSIS — R0602 Shortness of breath: Secondary | ICD-10-CM | POA: Diagnosis not present

## 2021-03-27 DIAGNOSIS — R300 Dysuria: Secondary | ICD-10-CM | POA: Diagnosis not present

## 2021-03-27 DIAGNOSIS — Z6841 Body Mass Index (BMI) 40.0 and over, adult: Secondary | ICD-10-CM | POA: Diagnosis not present

## 2021-03-27 DIAGNOSIS — R1084 Generalized abdominal pain: Secondary | ICD-10-CM | POA: Diagnosis not present

## 2021-03-27 DIAGNOSIS — R5383 Other fatigue: Secondary | ICD-10-CM | POA: Diagnosis not present

## 2021-03-27 DIAGNOSIS — J849 Interstitial pulmonary disease, unspecified: Secondary | ICD-10-CM | POA: Diagnosis not present

## 2021-03-27 DIAGNOSIS — R9389 Abnormal findings on diagnostic imaging of other specified body structures: Secondary | ICD-10-CM | POA: Diagnosis not present

## 2021-03-27 DIAGNOSIS — I2510 Atherosclerotic heart disease of native coronary artery without angina pectoris: Secondary | ICD-10-CM | POA: Diagnosis not present

## 2021-03-27 DIAGNOSIS — Z86711 Personal history of pulmonary embolism: Secondary | ICD-10-CM | POA: Diagnosis not present

## 2021-03-27 DIAGNOSIS — R079 Chest pain, unspecified: Principal | ICD-10-CM

## 2021-03-27 LAB — CBC W/ AUTO DIFF
BASOPHILS ABSOLUTE COUNT: 0 10*9/L (ref 0.0–0.1)
BASOPHILS RELATIVE PERCENT: 0.5 %
EOSINOPHILS ABSOLUTE COUNT: 0.1 10*9/L (ref 0.0–0.5)
EOSINOPHILS RELATIVE PERCENT: 1.2 %
HEMATOCRIT: 38.3 % (ref 34.0–44.0)
HEMOGLOBIN: 13 g/dL (ref 11.3–14.9)
LYMPHOCYTES ABSOLUTE COUNT: 1.6 10*9/L (ref 1.1–3.6)
LYMPHOCYTES RELATIVE PERCENT: 16.1 %
MEAN CORPUSCULAR HEMOGLOBIN CONC: 33.9 g/dL (ref 32.0–36.0)
MEAN CORPUSCULAR HEMOGLOBIN: 27.8 pg (ref 25.9–32.4)
MEAN CORPUSCULAR VOLUME: 82 fL (ref 77.6–95.7)
MEAN PLATELET VOLUME: 7 fL (ref 6.8–10.7)
MONOCYTES ABSOLUTE COUNT: 0.5 10*9/L (ref 0.3–0.8)
MONOCYTES RELATIVE PERCENT: 4.6 %
NEUTROPHILS ABSOLUTE COUNT: 7.7 10*9/L (ref 1.8–7.8)
NEUTROPHILS RELATIVE PERCENT: 77.6 %
PLATELET COUNT: 287 10*9/L (ref 150–450)
RED BLOOD CELL COUNT: 4.67 10*12/L (ref 3.95–5.13)
RED CELL DISTRIBUTION WIDTH: 14.3 % (ref 12.2–15.2)
WBC ADJUSTED: 9.9 10*9/L (ref 3.6–11.2)

## 2021-03-27 LAB — HEPATIC FUNCTION PANEL
ALBUMIN: 4 g/dL (ref 3.4–5.0)
ALKALINE PHOSPHATASE: 84 U/L (ref 46–116)
ALT (SGPT): 12 U/L (ref 10–49)
AST (SGOT): 17 U/L (ref <=34)
BILIRUBIN DIRECT: 0.1 mg/dL (ref 0.00–0.30)
BILIRUBIN TOTAL: 0.4 mg/dL (ref 0.3–1.2)
PROTEIN TOTAL: 7 g/dL (ref 5.7–8.2)

## 2021-03-27 LAB — IGG: GAMMAGLOBULIN; IGG: 564 mg/dL — ABNORMAL LOW (ref 646–2013)

## 2021-03-27 LAB — BASIC METABOLIC PANEL
ANION GAP: 11 mmol/L (ref 5–14)
BLOOD UREA NITROGEN: 10 mg/dL (ref 9–23)
BUN / CREAT RATIO: 9
CALCIUM: 9.7 mg/dL (ref 8.7–10.4)
CHLORIDE: 101 mmol/L (ref 98–107)
CO2: 23.5 mmol/L (ref 20.0–31.0)
CREATININE: 1.08 mg/dL — ABNORMAL HIGH
EGFR CKD-EPI (2021) FEMALE: 56 mL/min/{1.73_m2} — ABNORMAL LOW (ref >=60)
GLUCOSE RANDOM: 136 mg/dL (ref 70–179)
POTASSIUM: 3.7 mmol/L (ref 3.4–4.8)
SODIUM: 135 mmol/L (ref 135–145)

## 2021-03-27 LAB — C-REACTIVE PROTEIN: C-REACTIVE PROTEIN: 29 mg/L — ABNORMAL HIGH (ref <=10.0)

## 2021-03-27 LAB — D-DIMER, QUANTITATIVE: D-DIMER QUANTITATIVE (CW,ML,HL,HS,CH): 515 ng{FEU}/mL — ABNORMAL HIGH (ref <=500)

## 2021-03-27 LAB — MAGNESIUM: MAGNESIUM: 1.9 mg/dL (ref 1.6–2.6)

## 2021-03-27 LAB — TSH: THYROID STIMULATING HORMONE: 1.074 u[IU]/mL (ref 0.550–4.780)

## 2021-03-27 LAB — CK: CREATINE KINASE TOTAL: 35 U/L

## 2021-03-27 LAB — SEDIMENTATION RATE: ERYTHROCYTE SEDIMENTATION RATE: 14 mm/h (ref 0–30)

## 2021-03-27 LAB — T3: T3 TOTAL: 234.3 ng/dL — ABNORMAL HIGH (ref 60.0–180.0)

## 2021-03-27 LAB — B-TYPE NATRIURETIC PEPTIDE: B-TYPE NATRIURETIC PEPTIDE: 12.9 pg/mL (ref <=100)

## 2021-03-27 LAB — T4, FREE: FREE T4: 0.94 ng/dL (ref 0.89–1.76)

## 2021-03-27 MED ORDER — AZITHROMYCIN 250 MG TABLET
ORAL_TABLET | Freq: Every day | ORAL | 11 refills | 30.00000 days | Status: CP
Start: 2021-03-27 — End: 2022-03-27

## 2021-03-27 NOTE — Unmapped (Signed)
Refering Physician: Dr Kalman Shan, [facility]    CHief Complaint: ILD complicaitons    HPI   65yo women who presents with worsening SOB and fatigue  - she gets very SOB with actiivty deespite O2 levels of 99% on RA  - she notes chest pressure  - progressing over past 12 months  - no change in weight    Of Note  - she is grieving the loss of her husband who died in 06-20-18and her son who died 2018-12-04   Today  - feels OK, biggest issue is grief    ROS   - the balance of 10 systems is negative other than noted above     PMH   - ILD  - PE, 09/2016    --> bilateral  - OSA    --> CPAP  - CAD  - Fibromyalgia  - Depression    FH   - son with CF    SH   - non-smoker  - no birds/pets  - new house    MEDS (personally reviewed in EPIC, pertinent meds noted below)     PHYSICAL EXAM   Vitals - BP 145/78 (BP Site: L Arm)  - Pulse 112  - Temp 36.7 ??C (98 ??F) (Temporal)  - Ht 161.5 cm (5' 3.58)  - Wt (!) 109.3 kg (241 lb)  - SpO2 100%  - BMI 41.91 kg/m??   Gen - awake, alert, in NAD   Derm - no rash   HEENT - no adenopathy, TMs clear   Vascular - good pulses throughout, no JVP   CV - RRR,no murmers or gallops   Pulm - faint crackles at the bases  Abd - soft, NT, ND, +BS, no hepatosplenomegally   Ext - no edema, no clubbing   Joints - no enlarged joints, no warmth, redness or effusions   Neuro - CN grossly intact, nl gait     RESULTS     Labs (reviewd in EPIC, pertinent values noted below)       PFTs (personally reveiewed and interpreted)     Date: FVC (% Pred) FEV1 (% Pred) FEF25-75(% Pred) DLCO TBBx/Results                                                                                    03/27/21  1.73  1.44   71%     03/23/19  1.92  1.61   66%     03/09/18  1.86  1.52   63%     10/27/17  1.70  1.41   76%     08/11/17  1.91  1.57    63%     03/24/17  2.03  1.62  1.97  67%     12/06/16  1.86 (57%)  1.52 (60%)  1.86 (83%)  70%        6 Minute Walk   - walked 276m  - lowest sat was 97% on RA    HRCT Chest (03/2016, OSH, personally reviewed and interpretted)   - faint interstitial markings  - gas trapping  - resolved GG nodule (seen 2 weeks prior on CT abdomen)  HRCT Chest (09/2016, personally reviewed and interpretted)   - worsening bibasilar streaky densitites  - gas trapping    CTA Chest (09/2016, personally reviewed and interpretted)   - bilateral sub-massive PE with signs of RH strain    A/P   65yo with ILD and PE    New Symptoms  - unsure etiology of current symtpoms as no desaturations and PFTs simialr to prior hisotric ones  - she is at high risk for Pulm HTN and CAD with her weight, OSA and prior PE so will get a LHC/RHC  - will get Ddimer and if positive, will get CTA  - will get HRCT scan to look at parynchema  - labs today  - methhemoglobin when get arterial stick for LHC    ILD  - 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 Less likely NSIP.Marland Kitchen. will repeat with worsenign symptoms  - PFTs with moderate restriction and mildly reduced DLCO... PFTs up today  - walk with mild but not clinically significant hypoxia, simialr to prior but walked slower  - will continue cellcept 500mg  PO BID  - will continue prednsione 2.5mg  QD... She does not like prednisone and really does not want higher doses and affects her mental health  - MCTD w/u negative but full panel not sent  - pH/Mano with no reflux but esophogeal issues... Seeing GI  - Needs to work on weight loss and exercise... This will help the most    Sinusitis/PND  - advised neti pott twice daily  - no azithro as did not help    PE  - bilatearl and secondary to rotator cuff surgery and estrogen supplmetns  - stopped xeralto  - ddimer and CTA as above    PCP Proph:  - none needed    B[facility] Health  - Last Dexa:  Through her PCP    Vaccines  - Influenza:  - TDAP: 11/2015  - Prevnar 13: she says UTD  - PNA 23: she says UTD  - Shinrix: needs through local pharmacy      >32min was spent with the patient face to face and >25min was spent reviewing chart/imaging.    Complex medical decision making was done as we adjust medications based on blood work/drug levels and multiple complaints and problems were addresed

## 2021-03-27 NOTE — Unmapped (Signed)
-   follow-up as instructed    - for questions please reach out to my nurse Liesl:    --> Phone: [phone]    --> Fax: [phone]

## 2021-03-28 ENCOUNTER — Inpatient Hospital Stay: Admission: RE | Admit: 2021-03-28 | Payer: Medicare HMO | Source: Ambulatory Visit

## 2021-03-28 ENCOUNTER — Other Ambulatory Visit: Payer: Self-pay | Admitting: Family Medicine

## 2021-03-28 DIAGNOSIS — R112 Nausea with vomiting, unspecified: Secondary | ICD-10-CM

## 2021-03-28 DIAGNOSIS — R9389 Abnormal findings on diagnostic imaging of other specified body structures: Principal | ICD-10-CM

## 2021-03-28 DIAGNOSIS — R635 Abnormal weight gain: Principal | ICD-10-CM

## 2021-03-28 MED ORDER — PROMETHAZINE HCL 25 MG PO TABS: ORAL_TABLET | ORAL | 0 refills | Status: DC

## 2021-03-28 NOTE — Unmapped (Signed)
Addended by: Sela Hua on: 03/28/2021 07:49 AM     Modules accepted: Orders

## 2021-03-29 ENCOUNTER — Ambulatory Visit: Admit: 2021-03-29 | Discharge: 2021-03-30 | Payer: MEDICARE

## 2021-03-29 DIAGNOSIS — R635 Abnormal weight gain: Secondary | ICD-10-CM | POA: Diagnosis not present

## 2021-03-29 DIAGNOSIS — R9389 Abnormal findings on diagnostic imaging of other specified body structures: Secondary | ICD-10-CM | POA: Diagnosis not present

## 2021-03-29 DIAGNOSIS — N3001 Acute cystitis with hematuria: Principal | ICD-10-CM

## 2021-03-29 MED ORDER — CIPROFLOXACIN 750 MG TABLET
ORAL_TABLET | Freq: Two times a day (BID) | ORAL | 0 refills | 14.00000 days | Status: CP
Start: 2021-03-29 — End: 2021-04-12

## 2021-04-02 ENCOUNTER — Ambulatory Visit: Admit: 2021-04-02 | Discharge: 2021-04-02 | Payer: MEDICARE

## 2021-04-02 DIAGNOSIS — J849 Interstitial pulmonary disease, unspecified: Secondary | ICD-10-CM | POA: Diagnosis not present

## 2021-04-02 DIAGNOSIS — I272 Pulmonary hypertension, unspecified: Secondary | ICD-10-CM | POA: Diagnosis not present

## 2021-04-02 DIAGNOSIS — Z86711 Personal history of pulmonary embolism: Secondary | ICD-10-CM | POA: Diagnosis not present

## 2021-04-02 DIAGNOSIS — I10 Essential (primary) hypertension: Secondary | ICD-10-CM | POA: Diagnosis not present

## 2021-04-02 DIAGNOSIS — R9389 Abnormal findings on diagnostic imaging of other specified body structures: Secondary | ICD-10-CM | POA: Diagnosis not present

## 2021-04-02 DIAGNOSIS — E785 Hyperlipidemia, unspecified: Secondary | ICD-10-CM | POA: Diagnosis not present

## 2021-04-02 DIAGNOSIS — R0602 Shortness of breath: Secondary | ICD-10-CM | POA: Diagnosis not present

## 2021-04-02 DIAGNOSIS — R300 Dysuria: Secondary | ICD-10-CM | POA: Diagnosis not present

## 2021-04-02 DIAGNOSIS — R5383 Other fatigue: Secondary | ICD-10-CM | POA: Diagnosis not present

## 2021-04-02 DIAGNOSIS — R918 Other nonspecific abnormal finding of lung field: Secondary | ICD-10-CM | POA: Diagnosis not present

## 2021-04-02 DIAGNOSIS — R1084 Generalized abdominal pain: Secondary | ICD-10-CM | POA: Diagnosis not present

## 2021-04-02 DIAGNOSIS — Z882 Allergy status to sulfonamides status: Secondary | ICD-10-CM | POA: Diagnosis not present

## 2021-04-02 DIAGNOSIS — R079 Chest pain, unspecified: Secondary | ICD-10-CM | POA: Diagnosis not present

## 2021-04-02 DIAGNOSIS — R9431 Abnormal electrocardiogram [ECG] [EKG]: Secondary | ICD-10-CM | POA: Diagnosis not present

## 2021-04-02 DIAGNOSIS — G4733 Obstructive sleep apnea (adult) (pediatric): Secondary | ICD-10-CM | POA: Diagnosis not present

## 2021-04-02 MED ADMIN — midazolam (VERSED) injection: INTRAVENOUS | @ 13:00:00 | Stop: 2021-04-02

## 2021-04-02 MED ADMIN — verapamiL (ISOPTIN) injection: INTRA_ARTERIAL | @ 15:00:00 | Stop: 2021-04-02

## 2021-04-02 MED ADMIN — midazolam (VERSED) injection: INTRAVENOUS | @ 14:00:00 | Stop: 2021-04-02

## 2021-04-02 MED ADMIN — iohexoL (OMNIPAQUE) 300 mg iodine/mL solution: INTRACORONARY | @ 15:00:00 | Stop: 2021-04-02

## 2021-04-02 MED ADMIN — verapamiL (ISOPTIN) injection: INTRA_ARTERIAL | @ 14:00:00 | Stop: 2021-04-02

## 2021-04-02 MED ADMIN — lidocaine (PF) (XYLOCAINE-MPF) 20 mg/mL (2 %) injection: SUBCUTANEOUS | @ 14:00:00 | Stop: 2021-04-02

## 2021-04-02 MED ADMIN — fentaNYL (PF) (SUBLIMAZE) injection: INTRAVENOUS | @ 13:00:00 | Stop: 2021-04-02

## 2021-04-02 MED ADMIN — aspirin tablet 325 mg: 325 mg | ORAL | @ 13:00:00 | Stop: 2021-04-02

## 2021-04-02 MED ADMIN — heparin (porcine) in NS Manifold Flush: @ 14:00:00 | Stop: 2021-04-02

## 2021-04-02 MED ADMIN — fentaNYL (PF) (SUBLIMAZE) injection: INTRAVENOUS | @ 14:00:00 | Stop: 2021-04-02

## 2021-04-02 MED ADMIN — heparin (porcine) 1000 unit/mL injection: INTRAVENOUS | @ 14:00:00 | Stop: 2021-04-02

## 2021-04-02 MED ADMIN — sodium chloride (NS) 0.9 % infusion: INTRAVENOUS | @ 13:00:00 | Stop: 2021-04-02

## 2021-04-02 NOTE — Unmapped (Signed)
Patient returned from cath lab procedure A/O x 4. Report received from [facility]. Right vascular band 8 ml of air CDI with no bleeding or hematoma noted. Patient instructed to keep right arm elevated on pillow, verbalized understanding. Vs stable. Patient denies pain at this time.

## 2021-04-02 NOTE — Unmapped (Cosign Needed)
Cardiac Catheterization Laboratory  [place], [place]  Tel: [phone]     Fax: [phone]       HISTORY & PHYSICAL ASSESSMENT    PCP:  Frederico Hamman, MD  Phone:  [phone]  Fax:  [phone]    Referring Physicians:  No referring provider defined for this encounter.     Primary Cardiologist:  N/a    Procedures to be performed:  Coronary angiography, Possible Percutaneous Coronary Intervention, Possible Left Heart Catheterization, Possible Left Ventriculogram, Right heart catheterization    Indication:  Worsening DOE and fatigue    Consent:  I hereby certify that the nature, purpose, benefits, usual and most frequent risks of, and alternatives to, the operation or procedure have been explained to the patient (or person authorized to sign for the patient) either by a physician or by the provider who is to perform the operation or procedure, that the patient has had an opportunity to ask questions, and that those questions have been answered. The patient or the patient's representative has been advised that selected tasks may be performed by assistants to the primary health care provider(s). I believe that the patient (or person authorized to sign for the patient) understands what has been explained, and has consented to the operation or procedure.  _____________________________________________________________________    HISTORY: 65 year old female with history of ILD, prior PE (now off AC), HTN, HLD who presented to pulmonary clinic with worsening shortness of breath and fatigue over past 3 weeks.    Patient is notably DNR/DNI but agrees to code status reversal for the procedure.    Risk factors for coronary artery disease include:  chronic lung disease  Other history includes:  Never Smoked  No known history of prior PCI.  No known history of prior MI.  No known history of prior CABG.  No known heart failure.  No cardiac arrest surrounding this admission.    Assessments:  ECG :  normal Stress Test : No stress test performed  No new antiarrhythmic therapy initiated prior to cath lab.  No cardiac CTA performed  No prior angio WITHOUT intervention.  No record of EF within 6 months.  No Agatston coronary calcium score was assessed.  CSHA Clinical Frailty Scale : 5 - Mildly Frail  Chest Pain Assessment : atypical angina   Cardiovascular Instability : none    Medications Administered : (pre-procedure)  Aspirin    Medications Contraindicated :   None documented    There were no vitals taken for this visit.  General: Alert, NAD, sitting up in bed  HEENT: Sclera anicteric, MMM, no LAD  Cardiac: normal rate, regular rhythm, no murmurs rubs or gallops. No JVD  Pulmonary: bibasilar crackles, tight airflow  Abdomen: Soft, non-tender, non distended. NABS  Extremities: No LE edema  Neuro: AAOx4. No focal deficits     Labs and imaging were reviewed. Normal.     The patient's estimated bleeding risk is 2.1.     Strategies used to mitigate risk include: RRA access, RIJ access for RHC

## 2021-04-02 NOTE — Unmapped (Signed)
FINAL CARDIAC CATHETERIZATION REPORT (Right and Left Heart)  Conclusions:  ?? No significant coronary artery disease  ?? Mild pulmonary hypertension with mean PA pressure of 21 and concomitant wedge pressure of 10  ?? PVR of 1.4 by Fick and 1.7 by thermal    Plan:  ?? Per Dr. Dudley Major          PATIENT NAME:  Amber Bush  PROCEDURE DATE: April 02, 2021  Access Site: Right internal jugular vein and right radial artery  Diagnostic Attending:  Graciela Husbands    Fellow: Daubert    Referring: Dudley Major  Procedures Performed: Right heart catheterization, coronary angiography    Indication: 65 year old with pulmonary ILD and history of pulmonary emboli for hemodynamic and coronary evaluation  Contrast Used for diagnostic: 30    Findings:  Coronary Angiography:  Dominance: Right    Left Coronary:    Left Main: Large caliber    LAD:   Ostial LAD: Large caliber  Proximal LAD: Large caliber  Mid LAD: Large caliber  Distal LAD: Moderate caliber  Diag 1: Very small caliber  Diag 2: Moderate caliber branching  Diag 3: Small caliber tortuous  Diag 4: Very small caliber        LCx:   Ostial LCx: Large caliber  Proximal LCx: Large caliber  Mid LCx: Large caliber  Distal LCx: Small caliber  OM1: Very small  OM2: Large caliber tortuous branching vessel  OM3: Small caliber    Right Coronary:    Ostial RCA: Large-caliber  Proximal RCA: Large caliber with minimal irregularity up to 10%  Mid RCA: Moderate to large caliber  Distal RCA: Moderate to large caliber  Right PDA: Small to moderate caliber  RCA continuation: Small to moderate caliber  PL branch 1: Small caliber  PL branch 2: Small caliber  PL branch 3: Small caliber      Left Ventriculogram:  Not performed    Hemodynamics:  Left Heart:  Aorta: 130/60 mean: 93  LV (pre-A): n/a ? (post-A): n/a    Right Heart:  RA (mean): 9 ? RA (a-wave): 15 ? RA (v-wave): 10  RV: 43/13  PA: 35/15 mean: 21  Wedge (mean): 10? Wedge (a-wave): 18 ? Wedge (v-wave): 18    Ao sat(%):95 ?PA sat(%): 71.6  Cardiac Output (Fick): 8 L/Min ? Cardiac Index (Fick): 3.8 L/Min/M^2 ?  SVR (Fick): 840 ? PVR (Fick): 1.38    Cardiac Output (Thermal): 6.5 L/Min ? Cardiac Index (Thermal): 3.1 L/Min/M^2 ?  SVR (Thermal): 1034 ? PVR (Thermal): 1.69    Technique:  Right internal jugular vein was accessed using ultrasound-guided micropuncture technique.  A 7 French sheath was placed.  Right heart catheterization was performed with a 7 French thermal dilution catheter.  Subsequently, the right radial was accessed with an angiocath and a 79F sheath was placed.  3mg  of verapamil was administered via the sheath.  After accessing the ascending aorta, heparin was administered.  At the end of the procedure, a TR band was placed to achieve hemostasis.    Catheters used:  RCA: 6 multipurpose  LCA: 5 JL 3.5 dexterity  LVEDP: Not obtained  LVgram: Not performed    Right heart: 7 French thermal dilution    Complications: None  Estimated blood loss: Less than 60 mL   Flouro time: 7.2 minutes  Radiation dose: 296 mGy    Recent CV pertinent labs:  Lab Results   Component Value Date    INR 1.04 02/29/2020    BNP 12.90 03/27/2021  Creatinine 0.73 04/02/2021    Creatinine Whole Blood, POC 0.7 12/12/2017    Potassium 3.6 04/02/2021    BUN 10 04/02/2021         I have reviewed the recent history physical and documentation.    I personally spent 73 minutes, continuously monitoring the patient face to face during the administration of moderate sedation. Independent observer RN was present for the duration of the procedure to assist in patient monitoring. Pre and post sedation activities have been reviewed.    I Mittie Bodo, MD) was present for the entire procedure.

## 2021-04-03 DIAGNOSIS — R062 Wheezing: Secondary | ICD-10-CM | POA: Diagnosis not present

## 2021-04-03 DIAGNOSIS — J849 Interstitial pulmonary disease, unspecified: Secondary | ICD-10-CM | POA: Diagnosis not present

## 2021-04-03 DIAGNOSIS — G4733 Obstructive sleep apnea (adult) (pediatric): Secondary | ICD-10-CM | POA: Diagnosis not present

## 2021-04-11 DIAGNOSIS — M359 Systemic involvement of connective tissue, unspecified: Principal | ICD-10-CM

## 2021-04-11 MED ORDER — MYCOPHENOLATE MOFETIL 500 MG TABLET
ORAL_TABLET | Freq: Two times a day (BID) | ORAL | 11 refills | 30 days | Status: CP
Start: 2021-04-11 — End: 2022-04-11
  Filled 2021-04-16: qty 60, 30d supply, fill #0

## 2021-04-11 NOTE — Unmapped (Signed)
[facility] Refill Coordination Note    Specialty Medication(s) to be Shipped:   General Specialty: mycophenolate 500mg     Other medication(s) to be shipped: No additional medications requested for fill at this time     Amber Bush, DOB: 11/04/1955  Phone: [phone] (home)       All above HIPAA information was verified with patient.     Was a Nurse, learning disability used for this call? No    Completed refill call assessment today to schedule patient's medication shipment from the [facility] [phone]).  All relevant notes have been reviewed.     Specialty medication(s) and dose(s) confirmed: Regimen is correct and unchanged.   Changes to medications: Amber Bush reports no changes at this time.  Changes to insurance: No  New side effects reported not previously addressed with a pharmacist or physician: None reported  Questions for the pharmacist: No    Confirmed patient received a Conservation officer, historic buildings and a Surveyor, mining with first shipment. The patient will receive a drug information handout for each medication shipped and additional FDA Medication Guides as required.       DISEASE/MEDICATION-SPECIFIC INFORMATION        N/A    SPECIALTY MEDICATION ADHERENCE     Medication Adherence    Patient reported X missed doses in the last month: 0  Specialty Medication: Mycophenolate  500mg   Patient is on additional specialty medications: No  Patient is on more than two specialty medications: No              Were doses missed due to medication being on hold? No    Mycophenolate 500 mg: 8-9 days of medicine on hand       REFERRAL TO PHARMACIST     Referral to the pharmacist: Not needed      [facility]     Shipping address confirmed in Epic.     Delivery Scheduled: Yes, Expected medication delivery date: 04/17/21.     Medication will be delivered via UPS to the prescription address in Epic WAM.    Nancy Nordmann [facility] Specialty Technician

## 2021-04-13 ENCOUNTER — Telehealth: Payer: Medicare HMO | Admitting: Psychiatry

## 2021-04-16 ENCOUNTER — Other Ambulatory Visit: Payer: Self-pay

## 2021-04-16 DIAGNOSIS — I10 Essential (primary) hypertension: Secondary | ICD-10-CM

## 2021-04-16 DIAGNOSIS — R059 Cough: Principal | ICD-10-CM

## 2021-04-16 MED ORDER — LISINOPRIL 20 MG PO TABS: 20.0000 mg | ORAL_TABLET | Freq: Every day | ORAL | 0 refills | Status: AC

## 2021-04-16 MED ORDER — PREDNISONE 2.5 MG TABLET
ORAL_TABLET | Freq: Every day | ORAL | 3 refills | 90.00000 days | Status: CP
Start: 2021-04-16 — End: 2022-04-16

## 2021-04-16 MED ORDER — CODEINE 10 MG-GUAIFENESIN 100 MG/5 ML ORAL LIQUID
Freq: Three times a day (TID) | ORAL | 0 refills | 16.00000 days | Status: CP | PRN
Start: 2021-04-16 — End: 2021-04-16

## 2021-04-24 ENCOUNTER — Telehealth: Payer: Self-pay

## 2021-04-24 NOTE — Progress Notes (Signed)
Chronic Care Management Pharmacy Assistant   Name: Stacey Alvarez  MRN: [id number] DOB: [birth year]   Reason for Encounter: Disease State - Hypertension / Schedule CPP Follow up    Recent office visits:  None noted.  Recent consult visits:  04/02/21 Kerry Dory, MD - Cardiology - Shortness of Breath - No notes available.   03/27/21 Deon Pilling, MD - Pulmonology - Shortness of Breath - Azithromycin (ZITHROMAX) 250 MG tablet Take 1 tablet (250 mg total) by mouth in the morning prescribed. Follow up as needed.   03/27/21 Deon Pilling, MD - Pulmonology - Shortness of Breath - no notes available.   Hospital visits:  None in previous 6 months  Medications: Outpatient Encounter Medications as of 04/24/2021  Medication Sig   ALPRAZolam (XANAX) 1 MG tablet TAKE ONE TABLET BY MOUTH FOUR TIMES A DAY AS NEEDED FOR ANXIETY   amLODipine (NORVASC) 5 MG tablet TAKE 1/2 TABLET BY MOUTH IN THE MORNING AND 1 TABLET AT BEDTIME (Patient taking differently: TAKES 1 TABLET AT BEDTIME)   azithromycin (ZITHROMAX Z-PAK) 250 MG tablet As directed   butalbital-aspirin-caffeine-codeine (FIORINAL WITH CODEINE) 50-325-40-30 MG capsule Take 1 capsule by mouth every 6 (six) hours as needed for pain.   cetirizine (ZYRTEC) 10 MG tablet Take 10 mg by mouth daily.   cyanocobalamin (,VITAMIN B-12,) 1000 MCG/ML injection Inject 1 mL into the skin once a week for 4 weeks, then once a month for 5 months. (Patient not taking: Reported on 11/30/2020)   fenofibrate 160 MG tablet Take 1 tablet (160 mg total) by mouth daily.   fluticasone (FLONASE) 50 MCG/ACT nasal spray Place 1 spray into both nostrils daily.   lamoTRIgine (LAMICTAL) 100 MG tablet Take 1 tablet (100 mg total) by mouth daily.   lisinopril (ZESTRIL) 20 MG tablet Take 1 tablet (20 mg total) by mouth daily.   methylphenidate (RITALIN) 10 MG tablet Take 1/2-1 tablet twice daily as needed   mycophenolate (CELLCEPT) 500 MG tablet Take 500 mg by mouth 2 (two) times  daily.    OXYGEN Inhale 2 L into the lungs as needed (SOB).   pantoprazole (PROTONIX) 40 MG tablet TAKE ONE TABLET BY MOUTH TWICE A DAY (Patient taking differently: Take 40 mg by mouth daily.)   PARoxetine (PAXIL) 30 MG tablet Take 1 tablet (30 mg total) by mouth daily.   PARoxetine (PAXIL) 40 MG tablet Take 1 tablet (40 mg total) by mouth daily.   pravastatin (PRAVACHOL) 40 MG tablet Take 1 tablet (40 mg total) by mouth daily.   pregabalin (LYRICA) 75 MG capsule Take 1 capsule (75 mg total) by mouth 2 (two) times daily. (Patient not taking: No sig reported)   promethazine (PHENERGAN) 25 MG tablet TAKE ONE TABLET BY MOUTH EVERY 8 HOURS AS NEEDED FOR FOR NAUSEA AND VOMITING   SYRINGE-NEEDLE, DISP, 3 ML (B-D 3CC LUER-LOK SYR 25GX1") 25G X 1" 3 ML MISC USE TO ADMINSTER B12 INJECTIONS 1 TIME A WEEK FOR 4 WEEKS THEN ONCE MONTHLY FOR 5 MONTH   temazepam (RESTORIL) 30 MG capsule Take 1 capsule (30 mg total) by mouth at bedtime.   No facility-administered encounter medications on file as of 04/24/2021.    Current antihypertensive regimen:  Amlodipine 5 mg- TAKE 1/2 TABLET BY MOUTH IN THE MORNING AND 1 TABLET AT BEDTIME, Normal Patient taking differently: TAKES 1 TABLET AT BEDTIME lisinopril 20 MG tablet pravastatin 40 MG tablet fenofibrate 160 MG tablet  How often are you checking your Blood Pressure?  Current home BP readings:    What recent interventions/DTPs have been made by any provider to improve Blood Pressure control since last CPP Visit:     Any recent hospitalizations or ED visits since last visit with CPP?     What diet changes have been made to improve Blood Pressure Control?     What exercise is being done to improve your Blood Pressure Control?      Adherence Review: Is the patient currently on ACE/ARB medication? Yes Does the patient have >5 day gap between last estimated fill dates? ???  Amlodipine 5 mg- TAKE 1/2 TABLET BY MOUTH IN THE MORNING AND 1 TABLET AT  BEDTIME, Normal Patient taking differently: TAKES 1 TABLET AT BEDTIME - last filled 09/28/20 90 days ???? lisinopril 20 MG tablet - last filled 04/16/21 90 days  pravastatin 40 MG tablet - last filled 03/22/21 90 days  fenofibrate 160 MG tablet - last filled 03/22/21 90 days   Star Rating Drugs: Pravastatin (PRAVACHOL) 40 MG tablet - last filled 03/22/21 90 days  Lisinopril (ZESTRIL) 20 MG tablet - last filled 04/16/21 90 days   Future Appointments  Date Time Provider [place]  06/11/2021 10:50 AM [facility] MM 3 [facility]   Scheduled CPP follow up visit for   Multiple attempts were made to contact patient. Attempts were unsuccessful. / ls,CMA   Jobe Gibbon, [place] Pharmacist Assistant  [phone]    Jobe Gibbon, [place] Pharmacist Assistant  [phone]  Time Spent: 20 minutes

## 2021-05-03 DIAGNOSIS — G4733 Obstructive sleep apnea (adult) (pediatric): Secondary | ICD-10-CM | POA: Diagnosis not present

## 2021-05-04 DIAGNOSIS — J849 Interstitial pulmonary disease, unspecified: Secondary | ICD-10-CM | POA: Diagnosis not present

## 2021-05-04 DIAGNOSIS — G4733 Obstructive sleep apnea (adult) (pediatric): Secondary | ICD-10-CM | POA: Diagnosis not present

## 2021-05-04 DIAGNOSIS — R062 Wheezing: Secondary | ICD-10-CM | POA: Diagnosis not present

## 2021-05-08 NOTE — Unmapped (Signed)
[facility] Refill Coordination Note    Specialty Medication(s) to be Shipped:   General Specialty: mycophenolate 500mg     Other medication(s) to be shipped: No additional medications requested for fill at this time     Amber Bush, DOB: 01/28/1956  Phone: [phone] (home)       All above HIPAA information was verified with patient.     Was a Nurse, learning disability used for this call? No    Completed refill call assessment today to schedule patient's medication shipment from the [facility] [phone]).  All relevant notes have been reviewed.     Specialty medication(s) and dose(s) confirmed: Regimen is correct and unchanged.   Changes to medications: [name] reports no changes at this time.  Changes to insurance: No  New side effects reported not previously addressed with a pharmacist or physician: None reported  Questions for the pharmacist: No    Confirmed patient received a Conservation officer, historic buildings and a Surveyor, mining with first shipment. The patient will receive a drug information handout for each medication shipped and additional FDA Medication Guides as required.       DISEASE/MEDICATION-SPECIFIC INFORMATION        N/A    SPECIALTY MEDICATION ADHERENCE     Medication Adherence    Patient reported X missed doses in the last month: 0  Specialty Medication: Mycophenolate 500mg   Patient is on additional specialty medications: No  Patient is on more than two specialty medications: No              Were doses missed due to medication being on hold? No    Mycophenolate 500 mg: 7-8 days of medicine on hand       REFERRAL TO PHARMACIST     Referral to the pharmacist: Not needed      [facility]     Shipping address confirmed in Epic.     Delivery Scheduled: Yes, Expected medication delivery date: 05/14/21.     Medication will be delivered via UPS to the prescription address in Epic WAM.    Nancy Nordmann [facility] Specialty Technician

## 2021-05-11 MED FILL — MYCOPHENOLATE MOFETIL 500 MG TABLET: ORAL | 30 days supply | Qty: 60 | Fill #1

## 2021-05-25 DIAGNOSIS — M25572 Pain in left ankle and joints of left foot: Secondary | ICD-10-CM | POA: Diagnosis not present

## 2021-05-27 ENCOUNTER — Other Ambulatory Visit: Payer: Self-pay | Admitting: Family Medicine

## 2021-05-27 DIAGNOSIS — R112 Nausea with vomiting, unspecified: Secondary | ICD-10-CM

## 2021-05-28 MED ORDER — BUTALBITAL-ASA-CAFF-CODEINE 50-325-40-30 MG PO CAPS: 1.0000 | ORAL_CAPSULE | Freq: Four times a day (QID) | ORAL | 0 refills | Status: AC | PRN

## 2021-05-28 MED ORDER — PROMETHAZINE HCL 25 MG PO TABS: ORAL_TABLET | ORAL | 0 refills | Status: AC

## 2021-05-28 NOTE — Telephone Encounter (Signed)
Patient is requesting a refill of the following medications: Requested Prescriptions   Pending Prescriptions Disp Refills   butalbital-aspirin-caffeine-codeine (FIORINAL WITH CODEINE) 50-325-40-30 MG capsule 21 capsule 0    Sig: Take 1 capsule by mouth every 6 (six) hours as needed for pain.   promethazine (PHENERGAN) 25 MG tablet 30 tablet 0    Sig: TAKE ONE TABLET BY MOUTH EVERY 8 HOURS AS NEEDED FOR FOR NAUSEA AND VOMITING    Date of patient request: 05/27/2021 Last office visit: 10/09/2020 Date of last refill: 02/26/2021 codeine  03/28/2021 phenegan Last refill amount:  Follow up time period per chart: n/a

## 2021-05-28 NOTE — Telephone Encounter (Signed)
I will fill this today but this is the last time w/o an appt.  She was instructed in January to schedule her CPE in 6 months so she is now overdue.

## 2021-05-28 NOTE — Telephone Encounter (Signed)
Called and spoke with patient and informed patient about Rx. Patient understood. Patient has been scheduled for 06/22/21 at 9:30 for her physical.

## 2021-05-29 ENCOUNTER — Telehealth: Payer: Self-pay

## 2021-05-29 DIAGNOSIS — M7062 Trochanteric bursitis, left hip: Secondary | ICD-10-CM | POA: Diagnosis not present

## 2021-05-29 DIAGNOSIS — M545 Low back pain, unspecified: Secondary | ICD-10-CM | POA: Diagnosis not present

## 2021-05-29 DIAGNOSIS — M5442 Lumbago with sciatica, left side: Secondary | ICD-10-CM | POA: Diagnosis not present

## 2021-05-29 NOTE — Progress Notes (Signed)
Chronic Care Management Pharmacy Assistant   Name: Stacey Alvarez  MRN: [id number] DOB: 1955-11-06   Reason for Encounter: Disease State - Hypertension Call / Schedule CPP Follow up (see note)    Recent office visits:  None noted.   Recent consult visits:  04/02/21 Kerry Dory, MD - Cardiology - Shortness of Breath - No notes available.    03/27/21 Deon Pilling, MD - Pulmonology - Shortness of Breath - Azithromycin (ZITHROMAX) 250 MG tablet Take 1 tablet (250 mg total) by mouth in the morning prescribed. Follow up as needed.    03/27/21 Deon Pilling, MD - Pulmonology - Shortness of Breath - no notes available.   Hospital visits:  None in previous 6 months  Medications: Outpatient Encounter Medications as of 05/29/2021  Medication Sig   ALPRAZolam (XANAX) 1 MG tablet TAKE ONE TABLET BY MOUTH FOUR TIMES A DAY AS NEEDED FOR ANXIETY   amLODipine (NORVASC) 5 MG tablet TAKE 1/2 TABLET BY MOUTH IN THE MORNING AND 1 TABLET AT BEDTIME (Patient taking differently: TAKES 1 TABLET AT BEDTIME)   azithromycin (ZITHROMAX Z-PAK) 250 MG tablet As directed   butalbital-aspirin-caffeine-codeine (FIORINAL WITH CODEINE) 50-325-40-30 MG capsule Take 1 capsule by mouth every 6 (six) hours as needed for pain.   cetirizine (ZYRTEC) 10 MG tablet Take 10 mg by mouth daily.   cyanocobalamin (,VITAMIN B-12,) 1000 MCG/ML injection Inject 1 mL into the skin once a week for 4 weeks, then once a month for 5 months. (Patient not taking: Reported on 11/30/2020)   fenofibrate 160 MG tablet Take 1 tablet (160 mg total) by mouth daily.   fluticasone (FLONASE) 50 MCG/ACT nasal spray Place 1 spray into both nostrils daily.   lamoTRIgine (LAMICTAL) 100 MG tablet Take 1 tablet (100 mg total) by mouth daily.   lisinopril (ZESTRIL) 20 MG tablet Take 1 tablet (20 mg total) by mouth daily.   methylphenidate (RITALIN) 10 MG tablet Take 1/2-1 tablet twice daily as needed   mycophenolate (CELLCEPT) 500 MG tablet Take 500 mg by  mouth 2 (two) times daily.    OXYGEN Inhale 2 L into the lungs as needed (SOB).   pantoprazole (PROTONIX) 40 MG tablet TAKE ONE TABLET BY MOUTH TWICE A DAY (Patient taking differently: Take 40 mg by mouth daily.)   PARoxetine (PAXIL) 30 MG tablet Take 1 tablet (30 mg total) by mouth daily.   PARoxetine (PAXIL) 40 MG tablet Take 1 tablet (40 mg total) by mouth daily.   pravastatin (PRAVACHOL) 40 MG tablet Take 1 tablet (40 mg total) by mouth daily.   pregabalin (LYRICA) 75 MG capsule Take 1 capsule (75 mg total) by mouth 2 (two) times daily. (Patient not taking: No sig reported)   promethazine (PHENERGAN) 25 MG tablet TAKE ONE TABLET BY MOUTH EVERY 8 HOURS AS NEEDED FOR FOR NAUSEA AND VOMITING   SYRINGE-NEEDLE, DISP, 3 ML (B-D 3CC LUER-LOK SYR 25GX1") 25G X 1" 3 ML MISC USE TO ADMINSTER B12 INJECTIONS 1 TIME A WEEK FOR 4 WEEKS THEN ONCE MONTHLY FOR 5 MONTH   temazepam (RESTORIL) 30 MG capsule Take 1 capsule (30 mg total) by mouth at bedtime.   No facility-administered encounter medications on file as of 05/29/2021.    Current antihypertensive regimen:  Amlodipine 5 mg- TAKE 1/2 TABLET BY MOUTH IN THE MORNING AND 1 TABLET AT BEDTIME, Normal Patient taking differently: TAKES 1 TABLET DAILY lisinopril 20 MG tablet pravastatin 40 MG tablet fenofibrate 160 MG tablet  How often are you checking your  Blood Pressure?  Patient reported she has been checking daily. She states her blood pressures have been doing much better since her last visit.    Current home BP readings: 140/70   What recent interventions/DTPs have been made by any provider to improve Blood Pressure control since last CPP Visit:  Patient denied any changed in her current regimen.    Any recent hospitalizations or ED visits since last visit with CPP?  Patient has not had any hospitalizations or ED visits since last CPP visit.   What diet changes have been made to improve Blood Pressure Control?  Patient reported she has  been watching her salt intake daily.   What exercise is being done to improve your Blood Pressure Control?  Patient reports she is active daily. She does have interstitial lung disease but that has been getting better managed lately.     Adherence Review: Is the patient currently on ACE/ARB medication? Yes Does the patient have >5 day gap between last estimated fill dates? No  Amlodipine 5 mg- TAKE 1/2 TABLET BY MOUTH IN THE MORNING AND 1 TABLET AT BEDTIME, Normal - last filled 03/28/21 90 days (pt reported) Patient taking differently: TAKES 1 TABLET AT BEDTIME lisinopril 20 MG tablet - last filled 04/16/21 90 days  pravastatin 40 MG tablet - last filled 03/22/21 90 days  fenofibrate 160 MG tablet - last filled 03/22/21 90 days    Care Gaps  AWV: done 06/26/21 Colonoscopy: due 10/09/21 DM Eye Exam: N/A DEXA: last done 10/28/08 (overdue) Mammogram: due 06/21/21   Star Rating Drugs: Pravastatin (PRAVACHOL) 40 MG tablet - last filled 03/22/21 90 days  Lisinopril (ZESTRIL) 20 MG tablet - last filled 04/16/21 90 days   Future Appointments  Date Time Provider [place]  06/22/2021  9:30 AM Midge Minium, MD [facility]   Patient declined scheduling for Follow up with CPP as she will be moving next month and will be transitioning to another location.   Jobe Gibbon, [place] Pharmacist Assistant  [phone]  Time Spent: 40 minutes

## 2021-05-30 NOTE — Unmapped (Signed)
[facility] Refill Coordination Note    Specialty Medication(s) to be Shipped:   General Specialty: mycophenolate 500mg     Other medication(s) to be shipped: No additional medications requested for fill at this time     [name], DOB: 1956/03/26  Phone: [phone] (home)       All above HIPAA information was verified with patient.     Was a Nurse, learning disability used for this call? No    Completed refill call assessment today to schedule patient's medication shipment from the [facility] [phone]).  All relevant notes have been reviewed.     Specialty medication(s) and dose(s) confirmed: Regimen is correct and unchanged.   Changes to medications: [name] reports no changes at this time.  Changes to insurance: No  New side effects reported not previously addressed with a pharmacist or physician: None reported  Questions for the pharmacist: No    Confirmed patient received a Conservation officer, historic buildings and a Surveyor, mining with first shipment. The patient will receive a drug information handout for each medication shipped and additional FDA Medication Guides as required.       DISEASE/MEDICATION-SPECIFIC INFORMATION        N/A    SPECIALTY MEDICATION ADHERENCE     Medication Adherence    Patient reported X missed doses in the last month: 0  Specialty Medication: Mycophenolate 500mg   Patient is on additional specialty medications: No  Patient is on more than two specialty medications: No              Were doses missed due to medication being on hold? No    Mycophenolate 500 mg: 14 days of medicine on hand       REFERRAL TO PHARMACIST     Referral to the pharmacist: Not needed      [facility]     Shipping address confirmed in Epic.     Delivery Scheduled: Yes, Expected medication delivery date: 07/03/21.     Medication will be delivered via UPS to the prescription address in Epic WAM.    Ron Parker   [facility] Specialty Technician         06/20/21 Pt asked if she can call us back in one week. Sed      05/30/21 Patient stated she found an bottle in dresser drawer. So she currently has a month supply on hand. I will call her back in three weeks on 06/20/21. sed

## 2021-06-03 ENCOUNTER — Other Ambulatory Visit: Payer: Self-pay | Admitting: Psychiatry

## 2021-06-03 DIAGNOSIS — F330 Major depressive disorder, recurrent, mild: Secondary | ICD-10-CM

## 2021-06-03 DIAGNOSIS — G4733 Obstructive sleep apnea (adult) (pediatric): Secondary | ICD-10-CM | POA: Diagnosis not present

## 2021-06-03 DIAGNOSIS — F411 Generalized anxiety disorder: Secondary | ICD-10-CM

## 2021-06-04 DIAGNOSIS — R062 Wheezing: Secondary | ICD-10-CM | POA: Diagnosis not present

## 2021-06-04 DIAGNOSIS — G4733 Obstructive sleep apnea (adult) (pediatric): Secondary | ICD-10-CM | POA: Diagnosis not present

## 2021-06-04 DIAGNOSIS — J849 Interstitial pulmonary disease, unspecified: Secondary | ICD-10-CM | POA: Diagnosis not present

## 2021-06-04 NOTE — Telephone Encounter (Signed)
Please schedule appt

## 2021-06-05 NOTE — Telephone Encounter (Signed)
Left message for pt to call back to schedule.

## 2021-06-10 ENCOUNTER — Other Ambulatory Visit: Payer: Self-pay | Admitting: Cardiovascular Disease

## 2021-06-10 ENCOUNTER — Other Ambulatory Visit: Payer: Self-pay | Admitting: Family Medicine

## 2021-06-11 ENCOUNTER — Ambulatory Visit (INDEPENDENT_AMBULATORY_CARE_PROVIDER_SITE_OTHER): Payer: Medicare HMO | Admitting: Psychiatry

## 2021-06-11 ENCOUNTER — Encounter: Payer: Self-pay | Admitting: Psychiatry

## 2021-06-11 ENCOUNTER — Ambulatory Visit: Payer: Medicare HMO

## 2021-06-11 DIAGNOSIS — F330 Major depressive disorder, recurrent, mild: Secondary | ICD-10-CM

## 2021-06-11 DIAGNOSIS — R69 Illness, unspecified: Secondary | ICD-10-CM | POA: Diagnosis not present

## 2021-06-11 DIAGNOSIS — F411 Generalized anxiety disorder: Secondary | ICD-10-CM | POA: Diagnosis not present

## 2021-06-11 DIAGNOSIS — F5101 Primary insomnia: Secondary | ICD-10-CM | POA: Diagnosis not present

## 2021-06-11 MED ORDER — PANTOPRAZOLE SODIUM 40 MG PO TBEC: 40.0000 mg | DELAYED_RELEASE_TABLET | Freq: Two times a day (BID) | ORAL | 0 refills | Status: AC

## 2021-06-11 NOTE — Progress Notes (Signed)
Stacey Alvarez [id number] [birth year] 65 y.o.  Virtual Visit via Telephone Note  I connected with pt on 06/11/21 at 12:45 PM EDT by telephone and verified that I am speaking with the correct person using two identifiers.   I discussed the limitations, risks, security and privacy concerns of performing an evaluation and management service by telephone and the availability of in person appointments. I also discussed with the patient that there may be a patient responsible charge related to this service. The patient expressed understanding and agreed to proceed.   I discussed the assessment and treatment plan with the patient. The patient was provided an opportunity to ask questions and all were answered. The patient agreed with the plan and demonstrated an understanding of the instructions.   The patient was advised to call back or seek an in-person evaluation if the symptoms worsen or if the condition fails to improve as anticipated.  I provided 25 minutes of non-face-to-face time during this encounter.  The patient was located at home.  The provider was located at [place].   Thayer Headings, PMHNP   Subjective:   Patient ID:  Stacey Alvarez is a 65 y.o. (DOB 05/30/1956) female.  Chief Complaint:  Chief Complaint  Patient presents with   Follow-up    Depression, anxiety, and insomnia    HPI Stacey Alvarez presents for follow-up of depression, anxiety, and insomnia. She reports 3-4 months ago daughter left her boyfriend of many years. Daughter moved to [place] to accept a new job position and daughter is close to her previous roommate. Karlina has been packing to move to [place] since daughter has moved to [place]. She will also be close to her friends of 57 year in the [place] area. She has been working on cleaning out her home. "I've had to be busy with this move." Breck has bought a condo near her old neighborhood and friends.   She reports that she continues to experience  some depression. She reports "I feel like I am in a better place." She reports that the anniversaries of her husband and and son's birthdays. Sleeping well. She reports anxiety is "up and down." Has not not had recent crying episodes. Pt called office after having dry mouth with Paxil 40 mg and she is tolerating 30 mg dose without difficulty. Appetite has been good. She reports looking forward to going to places in [place]. She is thinking about going to church she used to attend. No longer feeling hopeless. Denies SI.   Reports that she has not tried Ritalin.   Reports that she has not see Beckey Downing, [facility] in the last few weeks.   Reports that she has about #40-50 Xanax remaining.   Review of Systems:  Review of Systems  Respiratory:  Positive for shortness of breath.   Musculoskeletal:  Negative for gait problem.       Recent sciatica  Neurological:        Improved migraines (one in about the last 6 months)  Psychiatric/Behavioral:         Please refer to HPI   Medications: I have reviewed the patient's current medications.  Current Outpatient Medications  Medication Sig Dispense Refill   ALPRAZolam (XANAX) 1 MG tablet TAKE ONE TABLET BY MOUTH FOUR TIMES A DAY AS NEEDED FOR ANXIETY 360 tablet 0   amLODipine (NORVASC) 5 MG tablet TAKE 1/2 TABLET BY MOUTH IN THE MORNING AND 1 TABLET AT BEDTIME (Patient taking differently: TAKES 1 TABLET AT BEDTIME) 135 tablet 3  butalbital-aspirin-caffeine-codeine (FIORINAL WITH CODEINE) 50-325-40-30 MG capsule Take 1 capsule by mouth every 6 (six) hours as needed for pain. 21 capsule 0   cetirizine (ZYRTEC) 10 MG tablet Take 10 mg by mouth daily.     fenofibrate 160 MG tablet Take 1 tablet (160 mg total) by mouth daily. 90 tablet 0   fluticasone (FLONASE) 50 MCG/ACT nasal spray Place 1 spray into both nostrils daily.     lamoTRIgine (LAMICTAL) 100 MG tablet Take 1 tablet (100 mg total) by mouth daily. 90 tablet 0   lisinopril (ZESTRIL) 20 MG tablet  Take 1 tablet (20 mg total) by mouth daily. 90 tablet 0   mycophenolate (CELLCEPT) 500 MG tablet Take 500 mg by mouth 2 (two) times daily.      OXYGEN Inhale 2 L into the lungs as needed (SOB).     pantoprazole (PROTONIX) 40 MG tablet Take 1 tablet (40 mg total) by mouth 2 (two) times daily. (Patient taking differently: Take 40 mg by mouth daily.) 120 tablet 0   PARoxetine (PAXIL) 30 MG tablet TAKE ONE TABLET BY MOUTH DAILY 30 tablet 0   pravastatin (PRAVACHOL) 40 MG tablet Take 1 tablet (40 mg total) by mouth daily. 90 tablet 3   predniSONE (DELTASONE) 2.5 MG tablet Take 2.5 mg by mouth daily.     promethazine (PHENERGAN) 25 MG tablet TAKE ONE TABLET BY MOUTH EVERY 8 HOURS AS NEEDED FOR FOR NAUSEA AND VOMITING 30 tablet 0   azithromycin (ZITHROMAX Z-PAK) 250 MG tablet As directed (Patient not taking: Reported on 06/11/2021) 6 tablet 0   cyanocobalamin (,VITAMIN B-12,) 1000 MCG/ML injection Inject 1 mL into the skin once a week for 4 weeks, then once a month for 5 months. (Patient not taking: Reported on 11/30/2020) 1 mL 8   methylphenidate (RITALIN) 10 MG tablet Take 1/2-1 tablet twice daily as needed (Patient not taking: Reported on 06/11/2021) 60 tablet 0   pregabalin (LYRICA) 75 MG capsule Take 1 capsule (75 mg total) by mouth 2 (two) times daily. (Patient not taking: No sig reported) 60 capsule 1   SYRINGE-NEEDLE, DISP, 3 ML (B-D 3CC LUER-LOK SYR 25GX1") 25G X 1" 3 ML MISC USE TO ADMINSTER B12 INJECTIONS 1 TIME A WEEK FOR 4 WEEKS THEN ONCE MONTHLY FOR 5 MONTH 10 each 0   temazepam (RESTORIL) 30 MG capsule Take 1 capsule (30 mg total) by mouth at bedtime. 90 capsule 1   No current facility-administered medications for this visit.    Medication Side Effects: None  Allergies:  Allergies  Allergen Reactions   Lisinopril     COUGH    Trazodone And Nefazodone     Paresthesias, weakness   Trintellix [Vortioxetine]    Macrobid [Nitrofurantoin Monohyd Macro] Diarrhea    Diarrhea, GI upset    Sulfa Antibiotics Rash    Past Medical History:  Diagnosis Date   Allergy    Anxiety    Arthritis    KNEES,BACK,HIPS   Depression    Fibromyalgia    GERD (gastroesophageal reflux disease)    Hyperlipidemia    Hypertension    IBS (irritable bowel syndrome)    Interstitial lung disease (HCC)    Pulmonary embolism ([place]) 09/2016   provoked- s/p rotator cuff repair   Sleep apnea     Family History  Problem Relation Age of Onset   Diabetes Mother    Heart disease Mother    Anxiety disorder Mother    Depression Mother    Kidney disease Mother  Diabetes Father    Heart disease Father    Anxiety disorder Father    Depression Father    Diabetes Paternal Grandmother    Cystic fibrosis Son    Alcohol abuse Son    Drug abuse Son    Anxiety disorder Son    Depression Son    Breast cancer Maternal Aunt    Breast cancer Maternal Aunt     Social History   Socioeconomic History   Marital status: Widowed    Spouse name: Not on file   Number of children: 2   Years of education: 12   Highest education level: High school graduate  Occupational History   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   Vaping Use   Vaping Use: Never used  Substance and Sexual Activity   Alcohol use: No    Alcohol/week: 0.0 standard drinks   Drug use: No   Sexual activity: Not on file  Other Topics Concern   Not on file  Social History Narrative   Lives alone, widowed. Has one dtr. in [place]. [place] and one son in [place]./fim   Social Determinants of Health   Financial Resource Strain: Low Risk    Difficulty of Paying Living Expenses: Not hard at all  Food Insecurity: Not on file  Transportation Needs: Not on file  Physical Activity: Insufficiently Active   Days of Exercise per Week: 4 days   Minutes of Exercise per Session: 20 min   Stress: No Stress Concern Present   Feeling of Stress : Only a little  Social Connections: Socially Isolated   Frequency of Communication with Friends and Family: More than three times a week   Frequency of Social Gatherings with Friends and Family: Never   Attends Religious Services: Never   Marine scientist or Organizations: No   Attends Archivist Meetings: Never   Marital Status: Widowed  Human resources officer Violence: Not At Risk   Fear of Current or Ex-Partner: No   Emotionally Abused: No   Physically Abused: No   Sexually Abused: No    Past Medical History, Surgical history, Social history, and Family history were reviewed and updated as appropriate.   Please see review of systems for further details on the patient's review from today.   Objective:   Physical Exam:  There were no vitals taken for this visit.  Physical Exam Neurological:     Mental Status: She is alert and oriented to person, place, and time.     Cranial Nerves: No dysarthria.  Psychiatric:        Attention and Perception: Attention and perception normal.        Speech: Speech normal.        Behavior: Behavior is cooperative.        Thought Content: Thought content normal. Thought content is not paranoid or delusional. Thought content does not include homicidal or suicidal ideation. Thought content does not include homicidal or suicidal plan.        Cognition and Memory: Cognition and memory normal.        Judgment: Judgment normal.     Comments: Insight intact Mood presents as less depressed and less anxious compared to previous exams. Thought content is more hopeful.     Lab Review:     Component Value Date/Time  NA 137 10/09/2020 1447   NA 138 07/14/2020 1020   K 4.0 10/09/2020 1447   CL 102 10/09/2020 1447   CO2 28 10/09/2020 1447   GLUCOSE 102 (H) 10/09/2020 1447   BUN 13 10/09/2020 1447   BUN 13 07/14/2020 1020   CREATININE 0.81 10/09/2020 1447   CALCIUM 9.5 10/09/2020  1447   PROT 7.1 10/09/2020 1447   ALBUMIN 4.5 10/09/2020 1447   AST 13 10/09/2020 1447   ALT 10 10/09/2020 1447   ALKPHOS 74 10/09/2020 1447   BILITOT 0.4 10/09/2020 1447   GFRNONAA 77 07/14/2020 1020   GFRAA 89 07/14/2020 1020       Component Value Date/Time   WBC 7.8 10/09/2020 1447   RBC 4.88 10/09/2020 1447   HGB 13.0 10/09/2020 1447   HCT 39.4 10/09/2020 1447   PLT 262.0 10/09/2020 1447   MCV 80.8 10/09/2020 1447   MCH 26.1 05/17/2020 1146   MCHC 33.0 10/09/2020 1447   RDW 14.1 10/09/2020 1447   LYMPHSABS 1.6 10/09/2020 1447   MONOABS 0.4 10/09/2020 1447   EOSABS 0.1 10/09/2020 1447   BASOSABS 0.1 10/09/2020 1447    No results found for: POCLITH, LITHIUM   No results found for: PHENYTOIN, PHENOBARB, VALPROATE, CBMZ   .res Assessment: Plan:   Patient seen for 25 minutes and time spent discussing her upcoming move and plan for ongoing care.  Discussed that she could continue treatment with this provider virtually since she will be continuing to reside in the state of [place].  Patient reports that she plans to continue treatment with this provider at this time. She reports that she would like to continue current medications without changes since she has been experiencing some improvement in mood and anxiety signs and symptoms.  She reports that she will physically move to the [facility] area this Thursday and is not yet sure which pharmacy she plans to use in that area.  She reports that she will contact office with new pharmacy information after her move and request refills at that time to her new pharmacy.  Continue Xanax 1 mg 4 times daily as needed for anxiety. Continue lamotrigine 100 mg daily for mood signs and symptoms. Continue Paxil 30 mg daily for anxiety and depression. Continue temazepam 30 mg at bedtime for insomnia. She reports that she has not yet started Ritalin and does not plan on starting this unless she begins experiencing increased depression and  fatigue. Recommend continuing psychotherapy. Patient to follow-up in 4 months or sooner if clinically indicated. Patient advised to contact office with any questions, adverse effects, or acute worsening in signs and symptoms.   Sherian was seen today for follow-up.  Diagnoses and all orders for this visit:  Generalized anxiety disorder  Mild episode of recurrent major depressive disorder ([place])  Primary insomnia   Please see After Visit Summary for patient specific instructions.  Future Appointments  Date Time Provider Alicia  06/22/2021  9:30 AM Midge Minium, MD [facility]  10/08/2021 12:45 PM Thayer Headings, PMHNP [facility] None    No orders of the defined types were placed in this encounter.     -------------------------------

## 2021-06-11 NOTE — Telephone Encounter (Signed)
Rx(s) sent to pharmacy electronically.  

## 2021-06-21 DIAGNOSIS — M79605 Pain in left leg: Secondary | ICD-10-CM | POA: Diagnosis not present

## 2021-06-21 DIAGNOSIS — G894 Chronic pain syndrome: Secondary | ICD-10-CM | POA: Diagnosis not present

## 2021-06-21 DIAGNOSIS — M545 Low back pain, unspecified: Secondary | ICD-10-CM | POA: Diagnosis not present

## 2021-06-21 DIAGNOSIS — M5416 Radiculopathy, lumbar region: Secondary | ICD-10-CM | POA: Diagnosis not present

## 2021-06-21 DIAGNOSIS — M25561 Pain in right knee: Secondary | ICD-10-CM | POA: Diagnosis not present

## 2021-06-21 DIAGNOSIS — M25562 Pain in left knee: Secondary | ICD-10-CM | POA: Diagnosis not present

## 2021-06-21 DIAGNOSIS — M5136 Other intervertebral disc degeneration, lumbar region: Secondary | ICD-10-CM | POA: Diagnosis not present

## 2021-06-21 DIAGNOSIS — M62830 Muscle spasm of back: Secondary | ICD-10-CM | POA: Diagnosis not present

## 2021-06-21 DIAGNOSIS — M79604 Pain in right leg: Secondary | ICD-10-CM | POA: Diagnosis not present

## 2021-06-22 ENCOUNTER — Encounter: Payer: Medicare HMO | Admitting: Family Medicine

## 2021-06-25 ENCOUNTER — Telehealth: Payer: Self-pay | Admitting: Psychiatry

## 2021-06-25 ENCOUNTER — Other Ambulatory Visit: Payer: Self-pay

## 2021-06-25 ENCOUNTER — Encounter: Payer: Self-pay | Admitting: Family Medicine

## 2021-06-25 DIAGNOSIS — F411 Generalized anxiety disorder: Secondary | ICD-10-CM

## 2021-06-25 DIAGNOSIS — T8484XA Pain due to internal orthopedic prosthetic devices, implants and grafts, initial encounter: Secondary | ICD-10-CM | POA: Diagnosis not present

## 2021-06-25 DIAGNOSIS — M76822 Posterior tibial tendinitis, left leg: Secondary | ICD-10-CM | POA: Diagnosis not present

## 2021-06-25 DIAGNOSIS — E785 Hyperlipidemia, unspecified: Secondary | ICD-10-CM

## 2021-06-25 MED ORDER — ALPRAZOLAM 1 MG PO TABS: ORAL_TABLET | ORAL | 0 refills | Status: DC

## 2021-06-25 MED ORDER — FENOFIBRATE 160 MG PO TABS: 160.0000 mg | ORAL_TABLET | Freq: Every day | ORAL | 0 refills | Status: AC

## 2021-06-25 NOTE — Telephone Encounter (Signed)
pended

## 2021-06-25 NOTE — Telephone Encounter (Signed)
Patient called requesting a refill on the Xanax. She stated per conversation with Janett Billow, until established in [place] she would do the refill. New pharmacy is the [facility] on [address] [id number]. Stacey Alvarez is scheduled for a MyChart visit on 1/23.

## 2021-06-29 DIAGNOSIS — R609 Edema, unspecified: Secondary | ICD-10-CM | POA: Diagnosis not present

## 2021-06-29 DIAGNOSIS — S82892A Other fracture of left lower leg, initial encounter for closed fracture: Secondary | ICD-10-CM | POA: Diagnosis not present

## 2021-06-29 DIAGNOSIS — M76822 Posterior tibial tendinitis, left leg: Secondary | ICD-10-CM | POA: Diagnosis not present

## 2021-06-29 DIAGNOSIS — M25572 Pain in left ankle and joints of left foot: Secondary | ICD-10-CM | POA: Diagnosis not present

## 2021-06-29 DIAGNOSIS — S82202A Unspecified fracture of shaft of left tibia, initial encounter for closed fracture: Secondary | ICD-10-CM | POA: Diagnosis not present

## 2021-07-02 DIAGNOSIS — T8484XA Pain due to internal orthopedic prosthetic devices, implants and grafts, initial encounter: Secondary | ICD-10-CM | POA: Diagnosis not present

## 2021-07-02 MED FILL — MYCOPHENOLATE MOFETIL 500 MG TABLET: ORAL | 30 days supply | Qty: 60 | Fill #2

## 2021-07-03 DIAGNOSIS — G4733 Obstructive sleep apnea (adult) (pediatric): Secondary | ICD-10-CM | POA: Diagnosis not present

## 2021-07-04 DIAGNOSIS — R062 Wheezing: Secondary | ICD-10-CM | POA: Diagnosis not present

## 2021-07-04 DIAGNOSIS — G4733 Obstructive sleep apnea (adult) (pediatric): Secondary | ICD-10-CM | POA: Diagnosis not present

## 2021-07-04 DIAGNOSIS — J849 Interstitial pulmonary disease, unspecified: Secondary | ICD-10-CM | POA: Diagnosis not present

## 2021-07-04 DIAGNOSIS — M25572 Pain in left ankle and joints of left foot: Principal | ICD-10-CM

## 2021-07-05 ENCOUNTER — Other Ambulatory Visit: Payer: Self-pay | Admitting: Psychiatry

## 2021-07-05 DIAGNOSIS — F330 Major depressive disorder, recurrent, mild: Secondary | ICD-10-CM

## 2021-07-05 DIAGNOSIS — F418 Other specified anxiety disorders: Secondary | ICD-10-CM | POA: Diagnosis not present

## 2021-07-05 DIAGNOSIS — E785 Hyperlipidemia, unspecified: Secondary | ICD-10-CM | POA: Diagnosis not present

## 2021-07-05 DIAGNOSIS — M4727 Other spondylosis with radiculopathy, lumbosacral region: Secondary | ICD-10-CM | POA: Diagnosis not present

## 2021-07-05 DIAGNOSIS — I10 Essential (primary) hypertension: Secondary | ICD-10-CM | POA: Diagnosis not present

## 2021-07-05 DIAGNOSIS — J849 Interstitial pulmonary disease, unspecified: Secondary | ICD-10-CM | POA: Diagnosis not present

## 2021-07-05 DIAGNOSIS — R69 Illness, unspecified: Secondary | ICD-10-CM | POA: Diagnosis not present

## 2021-07-05 DIAGNOSIS — Z0000 Encounter for general adult medical examination without abnormal findings: Secondary | ICD-10-CM | POA: Diagnosis not present

## 2021-07-05 DIAGNOSIS — F329 Major depressive disorder, single episode, unspecified: Secondary | ICD-10-CM | POA: Diagnosis not present

## 2021-07-05 DIAGNOSIS — R7301 Impaired fasting glucose: Secondary | ICD-10-CM | POA: Diagnosis not present

## 2021-07-05 DIAGNOSIS — F411 Generalized anxiety disorder: Secondary | ICD-10-CM

## 2021-07-05 DIAGNOSIS — J309 Allergic rhinitis, unspecified: Secondary | ICD-10-CM | POA: Diagnosis not present

## 2021-07-05 DIAGNOSIS — G4733 Obstructive sleep apnea (adult) (pediatric): Secondary | ICD-10-CM | POA: Diagnosis not present

## 2021-07-05 DIAGNOSIS — R739 Hyperglycemia, unspecified: Secondary | ICD-10-CM | POA: Diagnosis not present

## 2021-07-05 DIAGNOSIS — Z23 Encounter for immunization: Secondary | ICD-10-CM | POA: Diagnosis not present

## 2021-07-05 DIAGNOSIS — E559 Vitamin D deficiency, unspecified: Secondary | ICD-10-CM | POA: Diagnosis not present

## 2021-07-06 NOTE — Telephone Encounter (Signed)
?   Ok to give 90-day supply.

## 2021-07-10 NOTE — Unmapped (Signed)
[facility] CONSULT VISIT       Patient Name: Amber Bush: 65 y.o. (Jul 11, 1956)  Date of Encounter: 07/13/2021  Primary Care Provider:  Royce Macadamia, MD  Referring Provider: Arva Chafe    ASSESSMENT & PLAN:     Reason for visit: Amber Bush is a 65 y.o. female who presents in consultation at the request of Arva Chafe for preoperative evaluation as patient has history of DVT and PE .     Diagnosis ICD-10-CM Associated Orders   1. Left ankle pain, unspecified chronicity  M25.572 Ambulatory referral to Vascular Surgery   2. History of DVT (deep vein thrombosis)  Z86.718    3. Personal history of pulmonary embolism  Z86.711    4. 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  Patient with history of bilateral PT and peroneal vein DVTs and bilateral submassive PE with right heart strain in 2018.  She was treated with Xarelto x 6 months.  No issues since.  - Ongoing issues wit SOB, ILD.  Left and right heart catheterization earlier this year demonstrated no significant CAD with mild pulmonary hypertension.  Follows with pulm  - Most recent CTA chest in July 2022 negative for PE  - Denies leg pain currently other than her orthopedic pain  - Discussed with Dr. Selena Batten.  Given her significant history of bilateral DVT and PE with right heart strain, recommend placement of IVC filter preoperatively in addition to 2.5 mg of Eliquis twice daily x4 weeks starting the day of her procedure.  We will try to get this scheduled prior to her orthopedic procedure next Friday however patient understands that this may not be possible.  We will coordinate with Dr. Lissa Hoard.    Left Ankle Pain  - Ortho notes reviewed. Hx prior left ankle fracture fracture from a fall.    - Scheduled for removal of hardware with Dr Lissa Hoard on 07/20/2021.  Patient will be in a boot  and able to bear weight in boot after her surgery.     Hypertension  - Chronic. Stable. BP below goal < 130/80  - home meds: Norvasc, lisinopril  - BP 133/62 in office today  - Recommend nonpharmacological interventions including weight loss, DASH diet, sodium reduction, increased physical activity, and reducing alcohol consumption (limit 2 for males and 1 for females)  -Follows with primary care      - Patient will return sooner or present to the ED if symptoms worsen or change.       HISTORY OF PRESENT ILLNESS:     CHIEF COMPLAINT: I am having surgery on my ankle     BRIEF HISTORY:     Amber Bush (06/10/1956) is a 65 y.o. female with PMHx detailed below, who presents in consultation at the request of Arva Chafe for preoperative evaluation as patient has history of DVT and PE.    Patient with history of acute DVT involving the right and left posterior tibial veins and peroneal veins as well as bilateral submassive PE with right heart strain in 2018.  This was felt to be provoked by a sedentary lifestyle following rotator cuff surgery and estrogen replacement therapy.  No lytics.  Patient treated with Xarelto for 6 months.  She is scheduled to undergo hardware removal from the left ankle with Dr. Lissa Hoard next Friday, 07/20/2021.  She complains of some low back pain  and has sciatica on the left.  Patient denies any symptoms of claudication.  Denies any calf pain.  She does not take aspirin.    No other complaints at this time. All other systems reviewed are negative.    Smoking Status: never smoked.        CONSENT FOR CARE:     Risks, benefits, and alternatives were discussed with patient. Time was allotted for all questions and answers to patient's satisfaction and patient agrees to proceed with above procedure.    VASCULAR HISTORY:        No history of vascular intervention.        PMFSHx:     Past Medical History:   Diagnosis Date   ??? Anxiety    ??? Colon polyp    ??? Depression    ??? Diverticulitis of colon    ??? DVT (deep vein thrombosis) in pregnancy    ??? Fibrocystic breast    ??? Hypertension    ??? ILD (interstitial lung disease) (CMS-HCC)    ??? Interstitial lung disease (CMS-HCC)    ??? OSA (obstructive sleep apnea)    ??? PE (pulmonary thromboembolism) (CMS-HCC)     on xarelto   ??? Sleep apnea          Past Surgical History:   Procedure Laterality Date   ??? CHOLECYSTECTOMY     ??? HYSTERECTOMY     ??? PR CATH PLACE/CORON ANGIO, IMG SUPER/INTERP,R&L HRT CATH, L HRT VENTRIC N/A 04/02/2021    Procedure: Left/Right Heart Catheterization;  Surgeon: Neal Dy, MD;  Location: [facility] CATH;  Service: Cardiology   ??? PR ESOPHAGEAL MOTILITY STUDY, MANOMETRY N/A 01/16/2017    Procedure: ESOPHAGEAL MOTILITY STUDY W/INT & REP;  Surgeon: Nurse-Based Giproc;  [facility];  Service: Gastroenterology   ??? PR GERD TST W/ NASAL IMPEDENCE ELECTROD N/A 01/16/2017    Procedure: ESOPH FUNCT TST NASL ELEC PLCMT;  Surgeon: Nurse-Based Giproc;  [facility]: [facility];  Service: Gastroenterology   ??? PR GERD TST W/ NASAL PH ELECTROD N/A 01/16/2017    Procedure: ESOPHAGEAL 24 HOUR PH MONITORING;  Surgeon: Nurse-Based Giproc;  [facility];  Service: Gastroenterology   ??? PR UP GI ENDOSCOPY,BALL DIL,30MM N/A 10/28/2017    Procedure: UGI ENDO; W/BALLOON DILAT ESOPHAGUS (<30MM DIAM);  Surgeon: Liane Comber, MD;  Location: [facility];  Service: Gastroenterology   ??? PR UPPER GI ENDOSCOPY,BIOPSY Left 10/28/2017    Procedure: UGI ENDOSCOPY; WITH BIOPSY, SINGLE OR MULTIPLE;  Surgeon: Liane Comber, MD;  Location: [facility];  Service: Gastroenterology   ??? ROTATOR CUFF REPAIR Right 07/2016           Family History   Problem Relation Age of Onset   ??? Heart disease Mother    ??? Hypertension Mother    ??? Kidney disease Mother    ??? Heart disease Father    ??? Hypertension Father    ??? Kidney disease Father            Social History     Socioeconomic History   ??? Marital status: Widowed   Tobacco Use   ??? Smoking status: Never Smoker   ??? Smokeless tobacco: Never Used   Vaping Use   ??? Vaping Use: Never used   Substance and Sexual Activity   ??? Alcohol use: No     Alcohol/week: 0.0 standard drinks   ??? Drug use: No   ??? Sexual activity: Never  Current Allergy List  Allergies   Allergen Reactions   ??? Trazodone      Paresthesias, weakness   ??? Sulfa (Sulfonamide Antibiotics) Rash   ??? Sulfasalazine Rash       Current Medication List  Current Outpatient Medications   Medication Sig Dispense Refill   ??? ALPRAZolam (XANAX) 1 MG tablet Take 2 mg by mouth nightly. 1/2 tab in the morning.     ??? amLODIPine (NORVASC) 5 MG tablet Take 2.5 mg by mouth daily. 2.5 mg QHS     ??? baclofen (LIORESAL) 10 MG tablet      ??? cetirizine (ZYRTEC) 10 MG tablet Take 10 mg by mouth daily.     ??? codeine-butalbital-ASA-caff (FIORINAL WITH CODEINE) 30-50-325-40 mg capsule Take 1 capsule by mouth.     ??? fenofibrate (LOFIBRA) 160 MG tablet Take 160 mg by mouth daily.     ??? FLUoxetine (PROZAC) 40 MG capsule Take 30 mg by mouth daily. 30 mg once daily     ??? fluticasone (FLONASE) 50 mcg/actuation nasal spray 1 spray by Each Nare route daily.     ??? lamoTRIgine (LAMICTAL) 100 MG tablet Take 100 mg by mouth daily.     ??? lisinopril (PRINIVIL,ZESTRIL) 20 MG tablet Take 20 mg by mouth Two (2) times a day.      ??? mycophenolate (CELLCEPT) 500 mg tablet Take 1 tablet (500 mg total) by mouth Two (2) times a day. 60 tablet 11   ??? ondansetron (ZOFRAN-ODT) 4 MG disintegrating tablet      ??? pantoprazole (PROTONIX) 40 MG tablet      ??? pravastatin (PRAVACHOL) 40 MG tablet      ??? predniSONE (DELTASONE) 2.5 MG tablet Take 1 tablet (2.5 mg total) by mouth daily. 90 tablet 3   ??? promethazine (PHENERGAN) 25 MG tablet Take 25 mg by mouth every six (6) hours as needed for nausea.     ??? temAZEpam (RESTORIL) 30 mg capsule Take 30 mg by mouth nightly as needed for sleep.       No current facility-administered medications for this visit.       CLINICAL EXAM:     Vitals:    07/13/21 0922   BP: 133/62   Pulse: 86   Resp: 18   SpO2: 99%   Weight: (!) 109.2 kg (240 lb 12.8 oz)     Body mass index is 41.33 kg/m??.      Physical Exam      Constitutional - No acute distress, well appearing and well nourished  Eyes: Lids are normal without ptosis, edema, ectropion or entropion. Conjunctivae are normal and without inflammation, injection, hemorrhages or exudates.  Ear/Nose/Mouth/Throat - No drainage from the ears, oral and nasal mucosa moist. Tongue is normal and without lesions.  Head and Face - Face is symmetrical, no active lesions, or abrasions  Neck - Supple, symmetric. Trachea midline.  No masses. No JVD. No carotid bruits.   Cardiovascular - RRR. + 2/6 murmur. No rubs, or gallops. PMI not displaced. No thrills, lifts, or heaves.  Respiratory -  No increased work of breathing or signs of respiratory distress. Symmetrical lung expansion. Clear to auscultation.  Musculoskeletal - Muscle strength and tone normal. No atrophy or abnormal movements noted.  Patient walks with a cane  Extremities - Extremities are warm bilaterally. No wounds.  Radials palpable and equal bilaterally.  Skin - no rashes  Neurologic - CN II-XII grossly intact, no focal deficits noted  Psychiatric - Oriented to person, place,  and time.  Mood and affect is appropriate.      ANCILLARY DATA:     NA         Melody Comas, FNP-C  [facility]  [address], [address]  [place], [place] [address]  [phone] (office)  [phone] (fax)      This note was generated utilizing Dragon autodictation software.  Attempt has been made to correct errors but residual errors may remain and do not reflect the care provided.

## 2021-07-13 ENCOUNTER — Telehealth: Payer: Self-pay | Admitting: Psychiatry

## 2021-07-13 ENCOUNTER — Other Ambulatory Visit: Payer: Self-pay

## 2021-07-13 ENCOUNTER — Ambulatory Visit: Admit: 2021-07-13 | Discharge: 2021-07-14 | Payer: MEDICARE

## 2021-07-13 DIAGNOSIS — F5101 Primary insomnia: Secondary | ICD-10-CM

## 2021-07-13 DIAGNOSIS — T8484XA Pain due to internal orthopedic prosthetic devices, implants and grafts, initial encounter: Secondary | ICD-10-CM | POA: Diagnosis not present

## 2021-07-13 DIAGNOSIS — Z86718 Personal history of other venous thrombosis and embolism: Secondary | ICD-10-CM | POA: Diagnosis not present

## 2021-07-13 DIAGNOSIS — F332 Major depressive disorder, recurrent severe without psychotic features: Secondary | ICD-10-CM

## 2021-07-13 DIAGNOSIS — I10 Essential (primary) hypertension: Secondary | ICD-10-CM | POA: Diagnosis not present

## 2021-07-13 DIAGNOSIS — Z86711 Personal history of pulmonary embolism: Secondary | ICD-10-CM | POA: Diagnosis not present

## 2021-07-13 DIAGNOSIS — M25572 Pain in left ankle and joints of left foot: Secondary | ICD-10-CM | POA: Diagnosis not present

## 2021-07-13 DIAGNOSIS — Z6841 Body Mass Index (BMI) 40.0 and over, adult: Secondary | ICD-10-CM | POA: Diagnosis not present

## 2021-07-13 DIAGNOSIS — Z792 Long term (current) use of antibiotics: Principal | ICD-10-CM

## 2021-07-13 MED ORDER — LAMOTRIGINE 100 MG PO TABS: 100.0000 mg | ORAL_TABLET | Freq: Every day | ORAL | 0 refills | Status: DC

## 2021-07-13 MED ORDER — TEMAZEPAM 30 MG PO CAPS: 30.0000 mg | ORAL_CAPSULE | Freq: Every day | ORAL | 1 refills | Status: DC

## 2021-07-13 NOTE — Unmapped (Signed)
You have been scheduled for IVC Filter placement on  ________________ by Dr. Toribio Harbour      Before Your Procedure    You will receive a phone call from our clinical team members. The nurse will review your medical history and medications. Please have a current medication list available.    If you have been requested to have your kidney function checked by the lab (Creatinine) please complete prior to your surgery date.    The day before your procedure, you will be contacted between the hours of 3 p.m. and 5 p.m. with your arrival time. If you do not receive a call by 5 p.m. please call [phone].    You may receive a phone call from the hospital for pre-registration information or from a pre-procedure nurse to discuss your procedure.    Nothing to eat or drink after midnight, the night before your procedure. However you will be given specific instructions when the hospital calls you the day before with your arrival time.    If your procedure requires pre-certification or permission from your insurance, this office will make arrangements with your insurance company prior to your procedure. You will be notified if there are any problems.    If you have any questions about preparing for your procedure please contact this physician's office at [phone]. If you have any questions about  Hospital, navigating, parking, nearby lodging, or any other questions please call Benny at [phone].      Medication Instructions    Take your usual medications with a sip of water the morning of your procedure with the exception of:         The Day of Your Procedure    Please go to the [facility] at [facility] in [place]. If you are using GPS navigation, use the address [address] [place], [place] [address]    Enter the Hospital campus from [address] at the Emergency Department Entrance, and signs will direct you. Free valet parking is available in front of the [facility], and free self-parking is available in the [facility] Parking Deck, located to the left of the building.    Enter through the MAIN ENTRANCE and go to the RECEPTION DESK in the lobby.    Please bring a valid photo I.D., Insurance cards, and a list of your current medications.    Please note, you do not have a specific time scheduled for your procedure. Our goal is to have your procedure started within 3 hours of your arrival. We will do our best to keep you informed of any delays should they occur.    You are welcome to bring your favorite music, a good book or personal device to use during your wait.    You must have a responsible person remain at the hospital for the duration of your procedure to drive you home. Your procedure may be cancelled if you do not have a ride. Please let us know if transportation is a concern.    You will have activity restrictions immediately, and for a couple days, after your procedure. You will receive more information about your specific restrictions from your doctor and your nurse after your procedure.    You may be discharged home the same day of your procedure or you may stay overnight in the hospital. Please come prepared for either possibility.    If you stay overnight, you will be discharged early the next morning.  Please ensure you have a ride available by 8 am the day after your procedure.    You may need a follow-up appointment at your physician???s office after your procedure. Your doctor will tell you if and when you should be evaluated again.        If you have any questions, please call     [facility] - [phone]     Geralyn Corwin - [phone]        Gaynell Face- [phone]        Prudy Feeler [phone]

## 2021-07-13 NOTE — Unmapped (Cosign Needed)
History and Physical  Amber Bush  07/13/2021    Primary Care Provider:  Royce Macadamia, MD      Assessment/Plan:    REMOVAL OF LEFT ANKLE HARDWARE    Risks and hoped for benefits have been discussed with the patient. They state they understand, have no further questions, and wish to proceed with the surgery.     Due to the current Covid-19 pandemic it was discussed with the patient that some patients with co-morbidities have a higher risk of complications post operative. They have consented to move forward with surgery. They do not present with any cough, fever, or symptoms that are concerning today.    Postoperative medications of oxycodone be prescribed to the patient.  She is working with [name] for anticoagulation medication postoperatively.    Patient Active Problem List    Diagnosis Date Noted   ??? Chest pain 03/27/2021   ??? SOB (shortness of breath) 12/09/2016   ??? ILD (interstitial lung disease) (CMS-HCC) 12/09/2016           History of Present Illness:     Chief Complaint  65 yo female with left ankle pain    Allergies:  Trazodone, Sulfa (sulfonamide antibiotics), and Sulfasalazine    Medications:   Prior to Admission medications    Medication Sig Start Date End Date Taking? Authorizing Provider   ALPRAZolam Prudy Feeler) 1 MG tablet Take 2 mg by mouth nightly. 1/2 tab in the morning.    Historical Provider, MD   amLODIPine (NORVASC) 5 MG tablet Take 2.5 mg by mouth daily. 2.5 mg QHS    Historical Provider, MD   baclofen (LIORESAL) 10 MG tablet  07/09/21   Historical Provider, MD   cetirizine (ZYRTEC) 10 MG tablet Take 10 mg by mouth daily.    Historical Provider, MD   codeine-butalbital-ASA-caff Benny Lennert WITH CODEINE) 30-50-325-40 mg capsule Take 1 capsule by mouth. 05/28/21   Historical Provider, MD   fenofibrate (LOFIBRA) 160 MG tablet Take 160 mg by mouth daily.    Historical Provider, MD   FLUoxetine (PROZAC) 40 MG capsule Take 30 mg by mouth daily. 30 mg once daily    Historical Provider, MD fluticasone (FLONASE) 50 mcg/actuation nasal spray 1 spray by Each Nare route daily.    Historical Provider, MD   lamoTRIgine (LAMICTAL) 100 MG tablet Take 100 mg by mouth daily.    Historical Provider, MD   lisinopril (PRINIVIL,ZESTRIL) 20 MG tablet Take 20 mg by mouth Two (2) times a day.     Historical Provider, MD   mycophenolate (CELLCEPT) 500 mg tablet Take 1 tablet (500 mg total) by mouth Two (2) times a day. 04/11/21 04/11/22  Amber Geralds, MD   ondansetron (ZOFRAN-ODT) 4 MG disintegrating tablet  07/10/21   Historical Provider, MD   pantoprazole (PROTONIX) 40 MG tablet  06/11/21   Historical Provider, MD   pravastatin (PRAVACHOL) 40 MG tablet  06/11/21   Historical Provider, MD   predniSONE (DELTASONE) 2.5 MG tablet Take 1 tablet (2.5 mg total) by mouth daily. 04/16/21 04/16/22  Amber Geralds, MD   promethazine (PHENERGAN) 25 MG tablet Take 25 mg by mouth every six (6) hours as needed for nausea.    Historical Provider, MD   temAZEpam (RESTORIL) 30 mg capsule Take 30 mg by mouth nightly as needed for sleep.    Historical Provider, MD       Medical History:  Past Medical History:   Diagnosis Date   ??? Anxiety    ???  Colon polyp    ??? Depression    ??? Diverticulitis of colon    ??? DVT (deep vein thrombosis) in pregnancy    ??? Fibrocystic breast    ??? Hypertension    ??? ILD (interstitial lung disease) (CMS-HCC)    ??? Interstitial lung disease (CMS-HCC)    ??? OSA (obstructive sleep apnea)    ??? PE (pulmonary thromboembolism) (CMS-HCC)     on xarelto   ??? Sleep apnea        Surgical History:  Past Surgical History:   Procedure Laterality Date   ??? CHOLECYSTECTOMY     ??? HYSTERECTOMY     ??? PR CATH PLACE/CORON ANGIO, IMG SUPER/INTERP,R&L HRT CATH, L HRT VENTRIC N/A 04/02/2021    Procedure: Left/Right Heart Catheterization;  Surgeon: Neal Dy, MD;  [facility]: [facility] C[facility];  Service: Cardiology   ??? PR ESOPHAGEAL MOTILITY STUDY, MANOMETRY N/A 01/16/2017    Procedure: ESOPHAGEAL MOTILITY STUDY W/INT & REP;  Surgeon: Nurse-Based Giproc;  [facility];  Service: Gastroenterology   ??? PR GERD TST W/ NASAL IMPEDENCE ELECTROD N/A 01/16/2017    Procedure: ESOPH FUNCT TST NASL ELEC PLCMT;  Surgeon: Nurse-Based Giproc;  [facility];  Service: Gastroenterology   ??? PR GERD TST W/ NASAL PH ELECTROD N/A 01/16/2017    Procedure: ESOPHAGEAL 24 HOUR PH MONITORING;  Surgeon: Nurse-Based Giproc;  [facility];  Service: Gastroenterology   ??? PR UP GI ENDOSCOPY,BALL DIL,30MM N/A 10/28/2017    Procedure: UGI ENDO; W/BALLOON DILAT ESOPHAGUS (<30MM DIAM);  Surgeon: Liane Comber, MD;  Location: [facility];  Service: Gastroenterology   ??? PR UPPER GI ENDOSCOPY,BIOPSY Left 10/28/2017    Procedure: UGI ENDOSCOPY; WITH BIOPSY, SINGLE OR MULTIPLE;  Surgeon: Liane Comber, MD;  Location: [facility];  Service: Gastroenterology   ??? ROTATOR CUFF REPAIR Right 07/2016       Family History:  Family History   Problem Relation Age of Onset   ??? Heart disease Mother    ??? Hypertension Mother    ??? Kidney disease Mother    ??? Heart disease Father    ??? Hypertension Father    ??? Kidney disease Father      Family Status   Relation Name Status   ??? Mother  Deceased   ??? Sister  Alive   ??? Brother  Deceased   ??? Daughter  Alive   ??? Son  Alive   ??? Father  (Not Specified)       Code Status:  Full Code    Review of Systems:  A 12 system review of systems was negative except as noted in HPI.    Objective:     Heart Rate:  [86] 86  Resp:  [18] 18  BP: (133)/(62) 133/62  SpO2:  [99 %] 99 %    Physical Exam:  GEN:   no acute distress  NEURO:  awake, alert, oriented  CV:   regular rate & rhythm, normal S1 & S2, no murmurs  PULM:  lungs clear, no rales, ronchi, or wheezing  ABD:  soft, nontender, nondistended, normal bowel sounds  EXT:    Left foot and ankle without edema or ecchymosis.  Palpable pulse and sensation light touch.  Noted cap refill.    Test Results  Data Review:    Lab results are pending.      Imaging: Studies were reviewed by Dr. Pamella Pert    Rowan Blase,  PA-C

## 2021-07-13 NOTE — Unmapped (Signed)
Procedure: IVC Filter placement  Dx Code: history of DVT/PE  Anesthesia: conscious sedation/local  Status (Inpt/Outpt): outpatient  Medication Instructions: continue all medications  Physician: Selena Batten  Dialysis Days/Location: N/A    If IR case:  Laterality Access: RIGHT  Access Site: internal jugular or groin   Position(supine/prone): supine  Injector Needed (yes /no): NO      Time Frame: Prefer before 07/20/2021; patient has ortho surgery scheduled that day

## 2021-07-13 NOTE — Telephone Encounter (Signed)
Pt called and needs refills on her tenazapam and her lamictal 100 mg  90 day supply. [facility] located [place].Phone number is [phone]

## 2021-07-13 NOTE — Telephone Encounter (Signed)
Last refill Restoril 04/16/21 #90 Pended for Stacey Alvarez Lamictal #90 sent

## 2021-07-17 ENCOUNTER — Ambulatory Visit: Admit: 2021-07-17 | Discharge: 2021-07-17 | Payer: MEDICARE

## 2021-07-17 DIAGNOSIS — Z9049 Acquired absence of other specified parts of digestive tract: Secondary | ICD-10-CM | POA: Diagnosis not present

## 2021-07-17 DIAGNOSIS — R69 Illness, unspecified: Secondary | ICD-10-CM | POA: Diagnosis not present

## 2021-07-17 DIAGNOSIS — Z888 Allergy status to other drugs, medicaments and biological substances status: Secondary | ICD-10-CM | POA: Diagnosis not present

## 2021-07-17 DIAGNOSIS — M25572 Pain in left ankle and joints of left foot: Secondary | ICD-10-CM | POA: Diagnosis not present

## 2021-07-17 DIAGNOSIS — I10 Essential (primary) hypertension: Secondary | ICD-10-CM | POA: Diagnosis not present

## 2021-07-17 DIAGNOSIS — Z86718 Personal history of other venous thrombosis and embolism: Secondary | ICD-10-CM | POA: Diagnosis not present

## 2021-07-17 DIAGNOSIS — Z86711 Personal history of pulmonary embolism: Secondary | ICD-10-CM | POA: Diagnosis not present

## 2021-07-17 DIAGNOSIS — G4733 Obstructive sleep apnea (adult) (pediatric): Secondary | ICD-10-CM | POA: Diagnosis not present

## 2021-07-17 DIAGNOSIS — Z01810 Encounter for preprocedural cardiovascular examination: Secondary | ICD-10-CM | POA: Diagnosis not present

## 2021-07-17 LAB — BASIC METABOLIC PANEL
ANION GAP: 7 mmol/L (ref 3–11)
BLOOD UREA NITROGEN: 13 mg/dL (ref 9–23)
BUN / CREAT RATIO: 20
CALCIUM: 9.2 mg/dL (ref 8.7–10.4)
CHLORIDE: 103 mmol/L (ref 98–107)
CO2: 27 mmol/L (ref 20.0–31.0)
CREATININE: 0.66 mg/dL (ref 0.50–0.80)
EGFR CKD-EPI (2021) FEMALE: >90 mL/min/{1.73_m2} (ref >=60)
GLUCOSE RANDOM: 105 mg/dL — ABNORMAL HIGH (ref 70–99)
POTASSIUM: 3.8 mmol/L (ref 3.5–5.1)
SODIUM: 137 mmol/L (ref 136–145)

## 2021-07-17 LAB — CBC
HEMATOCRIT: 34.5 % — ABNORMAL LOW (ref 35.0–44.0)
HEMOGLOBIN: 11.6 g/dL — ABNORMAL LOW (ref 12.0–15.5)
MEAN CORPUSCULAR HEMOGLOBIN CONC: 33.7 g/dL (ref 30.0–36.0)
MEAN CORPUSCULAR HEMOGLOBIN: 28.3 pg (ref 26.0–34.0)
MEAN CORPUSCULAR VOLUME: 84.1 fL (ref 82.0–98.0)
MEAN PLATELET VOLUME: 7.2 fL (ref 7.0–10.0)
PLATELET COUNT: 215 10*9/L (ref 150–450)
RED BLOOD CELL COUNT: 4.1 10*12/L (ref 3.90–5.03)
RED CELL DISTRIBUTION WIDTH: 14.8 % (ref 12.0–15.0)
WBC ADJUSTED: 5.5 10*9/L (ref 3.5–10.5)

## 2021-07-17 MED ADMIN — midazolam (VERSED) injection: INTRAVENOUS | @ 20:00:00 | Stop: 2021-07-17

## 2021-07-17 MED ADMIN — fentaNYL (PF) (SUBLIMAZE) injection: INTRAVENOUS | @ 20:00:00 | Stop: 2021-07-17

## 2021-07-17 MED ADMIN — HYDROmorphone (PF) (DILAUDID) injection 0.5 mg: 0.5 mg | INTRAVENOUS | @ 21:00:00 | Stop: 2021-07-17

## 2021-07-17 MED ADMIN — lidocaine (PF) (XYLOCAINE-MPF) 10 mg/mL (1 %) injection: @ 20:00:00 | Stop: 2021-07-17

## 2021-07-17 MED ADMIN — iodixanoL (VISIPAQUE) 320 mg iodine/mL solution: INTRAVENOUS | @ 20:00:00 | Stop: 2021-07-17

## 2021-07-17 MED ADMIN — sodium chloride (NS) 0.9 % infusion: 50 mL/h | INTRAVENOUS | @ 19:00:00 | Stop: 2021-07-17

## 2021-07-17 NOTE — Unmapped (Signed)
Spoke with patient and agreed on surgery date of 07/17/2021 with Dr. Selena Batten. Confirmed arrival time of 1330 for surgery tomorrow. Reminded patient not to eat or drink anything after midnight. Patient verbalized understanding and agreed.     Per Katrina, no prior authorization required for CPT [phone].

## 2021-07-17 NOTE — Unmapped (Signed)
07/17/2021    PRE-OPERATIVE DIAGNOSIS:  Risk of pulmonary embolism with contraindication for anticoagulation     POST-OPERATIVE DIAGNOSIS:   Same    PROCEDURE:  IVC filter placement, Bard Denali  Ultrasound guided access, right common femoral vein    ANESTHESIA:  Local with IV sedation. Prior to the administration of sedation, the patient was evaluated with pre procedure documentation completed - refer to Immediate Pre Procedure Airway & Anesthesia/Pre-Sedation Reassessment Form.   Moderate / Deep Sedation with 4 mg Versed and 50 mcg Fentanyl was used for this procedure.  Please access the procedural documentation for dosages of medications used.  I continuously monitored the patient during the administration of moderate / deep sedation for the duration of this procedure for a total time of 25 minutes. The procedural nurse was present for the duration of the procedure to assist in monitoring the patient.  Patient assessed post sedation/anesthesia and tolerated well.  Vital signs stable and no complications noted.    PRIMARY SURGEON:  Amada Kingfisher, MD    ESTIMATED BLOOD LOSS:  1 mL    INDICATIONS:  This is a 65 y.o. female with a history of DVT who will be undergoing an invasive procedure requiring a holiday from anticoagulation.    PROCEDURE IN DETAIL:  The patient was identified in the pre-operative area. The risks, benefits and alternatives of the procedure were described to the patient.  The patient was given the opportunity to ask questions and all questions were answered to the patient's satisfaction.  Informed consent for the procedure was obtained.   A consent form was signed, witnessed and placed in the patients chart.    The patient was transferred to the angiosuite. Upon entry into the operating room the initial time-out verification was performed; verifying the patient identity / medical record number, procedural site, patient allergies, presence of a signed, witnessed and valid consent form for the procedure.    The patient was positioned supine on the operative table. After administration of appropriate anesthesia, the patient's right groin was prepped and draped according to standard sterile technique.  Utilizing the ultrasound, the right common femoral vein  was identified, noted to be patent and suitable for use, and needle and wire was then placed within the vein without difficulty.  The ultrasound image depicting this was saved and filed, hard copy images and reporting are documented.  The needle was removed and replaced with a micro sheath.  An 0.035 wire was then placed into the vena cava. Systemic heparin was administered. The microsheath was exchanged for a dilator and then the IVC filter delivery sheath was then placed.  A venogram was performed which located the washout from the renal veins bilaterally, and identified that the vena cava was of sufficient size to accept a filter and that there was no evidence of a filling defect within the IVC.  The filter was then brought to the field and placed in the infrarenal vena cava position and deployed.  The sheath was then withdrawn and manual pressure was held over the insertion site for five minutes with complete hemostasis.  The patient tolerated the procedure well and was taken to the recovery area in stable condition. There were no immediate peri-procedural complications.      Amada Kingfisher, MD FACS

## 2021-07-19 ENCOUNTER — Ambulatory Visit: Admit: 2021-07-19 | Discharge: 2021-07-20 | Payer: MEDICARE

## 2021-07-19 NOTE — Unmapped (Addendum)
Thank you for coming to the [facility] today and thank you for choosing [facility].        Your surgery date and arrival time    Surgery date is:  07/20/2021    @  12:30 pm    Arrive @ 10:30 at the [facility]. Arrive on time. These times are subject to change at any time, including the day of surgery.      General Instructions:__    Call a Registered Nurse at the [facility] (preadmission testing) anytime Monday - Friday from 8:00 am - 4:00 pm if you have any questions regarding the instructions in this after visit summary.  [phone]  [facility]  Call your surgeon's office if you become sick (such as fever or cough), have a new rash or wound, have changes in your health or need to reschedule your surgery.  If you are going home the same day as your surgery you are not allowed to drive yourself home.  A support person (a responsible adult age 76 or over) must drive you home and stay with you for the next 24 hours after anesthesia. Ride services like Lyft, [employer], taxi, bus, etc can only be used if the responsible adult accompanies you.    Visitor Guidelines:    [facility] remains committed to patient-centered, safe care throughout all of our hospitals and health care facilities.  As the COVID-19 pandemic continues to evolve and change we will continue to update our visitor policy to reflect these changes.     Adult patients are allowed two visitors (age 29 and older) at a time while in the hospital.  Please note that only one visitor at a time is allowed in the Preop area.  One of those visitors may stay the night, but they need to be in the patient room by 9:00 pm.    Information on receiving the COVID 19 Vaccine    For our Patients and the Community please visit [email or web address] to schedule a vaccination.    Two weeks before surgery:    Stop taking all weight loss medicines that contain stimulants - such as Phentermine (Adipex, Suprenza) or Phentermine/Topiramate (Qsymia).  Please consult with your ordering provider if you take any of the following medications as anesthesia providers recommend stopping them two weeks before surgery:  Marplan (Isocarboxazid), Nardil (phenelzine), Eldepryl (selegiline), Zelapar (selegiline), Parnate (tranylcypromine), Azilect (rasagiline).     One week before surgery:    Stop taking these medications 7 days before surgery:   Stop taking NSAID medicines like: Advil, Motrin, Ibuprofen, Aleve, Naproxen, Mobic, Meloxicam, Voltaren, Diclofenac, Etodalac, Excedrin, BC Powder.    Stop taking herbal supplements and drinking herbal teas including green tea.    If you take Aspirin for general wellbeing and don't have any heart, vascular or stroke history it is likely that your surgeon will allow you to remain on it but follow whatever instructions they gave you.    The day/night before surgery:    Do not shave or wax your surgical area within 24 hours before surgery.  Do not smoke, use ecigarettes, use illegal drugs or drink alcohol within 24 hours before surgery.          Day of Surgery:    DO NOT EAT FOOD AFTER MIDNIGHT - this includes candy, gums and mints  Unless otherwise directed, you may have up to 12 ounces of the following clear liquids between midnight and 3 hours prior to your procedure time:  Water    Apple juice       White grape juice   Clear/yellow/green sports drinks (non-carbonated)  Tea or coffee. Sugar is ok. No creamer and No milk.    Take only these medicines morning of surgery and no others: Take am dose of Protonix, Lamicital, Paxil, Zyrtec, Amlodipine, Cellcept, Xanax and Prednisone with a sip of water prior to surgery.    Remove all of your jewelry (including piercings and wedding rings) and leave it at home   Remove colored polish on both ring fingers for anesthesia monitoring  Take a shower with an antibacterial soap such as Dial or Safeguard  After showering, do not apply anything on your skin such as makeup, deodorant, lotion, powder, etc. Put on clean, loose, comfortable clothing. If having breast, shoulder or arm surgery may want to wear a button down shirt to the hospital   Brush your teeth  Do not wear contact lenses  Do not use a tampon  If you wear glasses, hearing aides or dentures please bring the cases with you  If you have an ostomy, insulin pump, CPAP machine, or home oxygen please bring all necessary supplies with you to the hospital  If you are bringing an overnight bag it's best to leave it in the car and your support person can go get it later when you get to your room         Managing pain after surgery:    It is our goal to provide pain relief and comfort after your procedure. To do so you and our staff need to be able to clearly communicate about your pain. We use the scale below to help evaluate your comfort level.  We want you to talk about your discomfort with your nurse and ask for help to manage it. There are many options your nurse will have to help with pain management such as medicines, repositioning, ice, etc.         Preventing Surgical Infections:    We are committed to the health and wellbeing of our patients and take several steps to minimize the risk of infections.     What we are doing:     Before surgery, your surgeon will clean your surgical site with antimicrobial soap and if necessary remove hair with clippers.    Your surgeon may give you antibiotics during surgery.    Your surgeon and operating room staff will clean their hands and arms, wear hair covers, gowns, masks and gloves in the operating room.    What is anesthesia?    Anesthesia is a way to sedate patients as well as to control pain during surgery or procedures.  The medications given to you while you are asleep help control your breathing,  blood pressure, blood flow, heart rate and rhythm.    What determines the type of anesthesia that is best for you?    Past and current health status  Type of surgery or procedure  Results of blood tests, EKG and other health studies    Types of Anesthesia:    GENERAL ANESTHESIA   You are unconscious and have no awareness  or other sensations  You may receive medicine through an IV or breathe them through a mask Possible Side Effects    Nausea and vomiting  Sore throat  Dry mouth Metallic taste  Sleepiness  Shivering   MONITORED ANESTHESIA CARE (MAC)   You will receive sedative medications through your IV which may make you  drowsy or fall asleep  You may not remember your procedure due to the medications given during your procedure Possible Side Effects  Nausea and vomiting  Sore throat  Sleepiness   REGIONAL ANESTHESIA   Blocks sensation of pain to a large area of  the body  First, you may be given some medicine through your IV to help relax  Then, a shot of medication is injected around specific nerves to block pain  This medicine may keep you pain free or  lessen the pain for up to 72 hours   Possible Side Effects    Headache  Difficulty urinating  Back pain   Discolored or swollen area  Temporary weakness in arm or legs

## 2021-07-20 ENCOUNTER — Encounter: Admit: 2021-07-20 | Discharge: 2021-07-20 | Payer: MEDICARE | Attending: Anesthesiology | Primary: Anesthesiology

## 2021-07-20 ENCOUNTER — Ambulatory Visit: Admit: 2021-07-20 | Discharge: 2021-07-20 | Payer: MEDICARE

## 2021-07-20 DIAGNOSIS — I10 Essential (primary) hypertension: Secondary | ICD-10-CM | POA: Diagnosis not present

## 2021-07-20 DIAGNOSIS — Y838 Other surgical procedures as the cause of abnormal reaction of the patient, or of later complication, without mention of misadventure at the time of the procedure: Secondary | ICD-10-CM | POA: Diagnosis not present

## 2021-07-20 DIAGNOSIS — M25572 Pain in left ankle and joints of left foot: Secondary | ICD-10-CM | POA: Diagnosis not present

## 2021-07-20 DIAGNOSIS — G8918 Other acute postprocedural pain: Secondary | ICD-10-CM | POA: Diagnosis not present

## 2021-07-20 DIAGNOSIS — Z79899 Other long term (current) drug therapy: Secondary | ICD-10-CM | POA: Diagnosis not present

## 2021-07-20 DIAGNOSIS — Z7982 Long term (current) use of aspirin: Secondary | ICD-10-CM | POA: Diagnosis not present

## 2021-07-20 DIAGNOSIS — G4733 Obstructive sleep apnea (adult) (pediatric): Secondary | ICD-10-CM | POA: Diagnosis not present

## 2021-07-20 DIAGNOSIS — K219 Gastro-esophageal reflux disease without esophagitis: Secondary | ICD-10-CM | POA: Diagnosis not present

## 2021-07-20 DIAGNOSIS — T8484XA Pain due to internal orthopedic prosthetic devices, implants and grafts, initial encounter: Secondary | ICD-10-CM | POA: Diagnosis not present

## 2021-07-20 DIAGNOSIS — E785 Hyperlipidemia, unspecified: Secondary | ICD-10-CM | POA: Diagnosis not present

## 2021-07-20 DIAGNOSIS — T85698A Other mechanical complication of other specified internal prosthetic devices, implants and grafts, initial encounter: Secondary | ICD-10-CM | POA: Diagnosis not present

## 2021-07-20 LAB — COMPREHENSIVE METABOLIC PANEL
ALBUMIN: 3.7 g/dL (ref 3.4–5.0)
ALKALINE PHOSPHATASE: 63 U/L (ref 46–116)
ALT (SGPT): <7 U/L — ABNORMAL LOW (ref 10–49)
ANION GAP: 9 mmol/L (ref 3–11)
AST (SGOT): 13 U/L (ref <34)
BILIRUBIN TOTAL: 0.3 mg/dL (ref 0.3–1.2)
BLOOD UREA NITROGEN: 8 mg/dL — ABNORMAL LOW (ref 9–23)
BUN / CREAT RATIO: 11
CALCIUM: 9.5 mg/dL (ref 8.7–10.4)
CHLORIDE: 103 mmol/L (ref 98–107)
CO2: 26 mmol/L (ref 20.0–31.0)
CREATININE: 0.72 mg/dL (ref 0.50–0.80)
EGFR CKD-EPI (2021) FEMALE: >90 mL/min/{1.73_m2} (ref >=60)
GLUCOSE RANDOM: 124 mg/dL (ref 70–179)
POTASSIUM: 3.8 mmol/L (ref 3.5–5.1)
PROTEIN TOTAL: 6.7 g/dL (ref 5.7–8.2)
SODIUM: 138 mmol/L (ref 136–145)

## 2021-07-20 MED ADMIN — ePHEDrine (PF) 25 mg/5 mL (5 mg/mL) in 0.9% sodium chloride syringe Syrg: INTRAVENOUS | @ 17:00:00 | Stop: 2021-07-20

## 2021-07-20 MED ADMIN — midazolam (VERSED) injection: INTRAVENOUS | @ 17:00:00 | Stop: 2021-07-20

## 2021-07-20 MED ADMIN — propofoL (DIPRIVAN) injection: INTRAVENOUS | @ 17:00:00 | Stop: 2021-07-20

## 2021-07-20 MED ADMIN — fentaNYL (PF) (SUBLIMAZE) injection 25 mcg: 25 ug | INTRAVENOUS | @ 18:00:00 | Stop: 2021-07-20

## 2021-07-20 MED ADMIN — lidocaine (PF) (XYLOCAINE-MPF) 10 mg/mL (1 %) injection 10 mL: 10 mL | @ 16:00:00 | Stop: 2021-07-20

## 2021-07-20 MED ADMIN — midazolam (VERSED) injection 1 mg: 1 mg | INTRAVENOUS | @ 16:00:00 | Stop: 2021-07-20

## 2021-07-20 MED ADMIN — ceFAZolin 2 g in 100 mL sodium chloride 0.9% (premix): 2 g | INTRAVENOUS | @ 17:00:00 | Stop: 2021-07-20

## 2021-07-20 MED ADMIN — lactated Ringers infusion: 40 mL/h | INTRAVENOUS | @ 17:00:00 | Stop: 2021-07-20

## 2021-07-20 MED ADMIN — ondansetron (ZOFRAN) injection: INTRAVENOUS | @ 17:00:00 | Stop: 2021-07-20

## 2021-07-20 MED ADMIN — sodium chloride irrigation (NS) 0.9 % irrigation solution: @ 17:00:00 | Stop: 2021-07-20

## 2021-07-20 MED ADMIN — ropivacaine (NAROPIN) 5 mg/mL (0.5 %) injection 40 mL: 40 mL | INTRA_ARTICULAR | @ 16:00:00 | Stop: 2021-07-20

## 2021-07-20 MED ADMIN — fentaNYL (PF) (SUBLIMAZE) injection 50 mcg: 50 ug | INTRAVENOUS | @ 16:00:00 | Stop: 2021-07-20

## 2021-07-20 NOTE — Unmapped (Signed)
OPERATIVE NOTE      DATE OF SERVICE:   07/20/2021    PREOPERATIVE DIAGNOSIS: Painful hardware left ankle    POSTOPERATIVE DIAGNOSIS: Same as preop    PROCEDURE: Removal of plate and screws left distal fibula    SURGEON: Dr. Pamella Pert    ASSISTANT: None    ANESTHESIA:  GETA with popliteal block    ESTIMATED BLOOD LOSS: Minimal    COMPLICATIONS:  None    SPECIMENS: None sent    INDICATIONS FOR PROCEDURE: She is a 65 year old female with history of previous ankle fracture that underwent open reduction and internal fixation.  She has developed persistent pain around the hardware and wishes to have it removed..  The patient understood the risks and hoped for benefits of surgery including bleeding, infection, scar tissue, injury to nearby structures such as  nearby vessels and nerves, heart and lung complications, anesthetic risks, blood clot, and even death.  The patient agreed to proceed with surgery.    DESCRIPTION OF PROCEDURE: I identified the appropriate patient and site for surgery in the holding area.  She underwent application of a popliteal block per anesthesia.  The patient was brought to the operating room and placed in the supine position on the operating room table.  After induction of anesthesia the patient was prepped and draped in the usual sterile fashion.  A timeout was performed.  The patient received Ancef 2 g as preoperative antibiotic prophylaxis.  Care was taken to pad all bony prominences.  A tourniquet was placed around the left proximal thigh.    The left lower extremity is prepped and draped in usual sterile fashion.  Once prepping and draping is completed the left lower extremity is elevated and exsanguinated.  The tourniquet is inflated for a total of 10 minutes.  The old scar was used as a guide.  Sharp dissection is used to go through skin and subcutaneous tissue.  The scar tissue over the plate is divided.  The screws were then exposed and the screws that are visible are removed.  The plate is then removed from the bone.  A rongeur was used to debride away sharp bone edges.  The C arm shows that the plate and screws have been removed from the fibula.  There was a pre-existing broken syndesmotic screw.  I carefully looked at that under fluoroscopy and that screw was not interfering with the fibula and is in the tibia.  Removing the broken screw was not indicated as it would cause more harm than good.  The wound is irrigated.  I let the tourniquet down to 10 minutes.  Bleeding is controlled with direct pressure and the Bovie.  I closed the subcutaneous layer with 2-0 Vicryl suture.  I closed the skin with staples.  Sterile dressings are applied.  Her boot is applied.  She is taken to recovery in a stable fashion.  She tolerated the procedure well.    The patient tolerated the entire procedure very well and was brought to the PACU in stable condition.    Amedeo Kinsman  07/20/2021 1:28 PM    This documented was created via [employer] recognition software and may contain errors.

## 2021-07-20 NOTE — Unmapped (Signed)
Brief Operative Note  (CSN: [id number])      Date of Surgery: 07/20/2021    Pre-op Diagnosis: PAIN IN LEFT ANKLE PROSTHETIC DEVICE    Post-op Diagnosis: Same as preop    Procedure(s):  REMOVAL OF LEFT ANKLE HARDWARE: 98119 (CPT??)  Note: Revisions to procedures should be made in chart - see Procedures activity.    Performing Service: Orthopedics  Surgeon(s) and Role:     * Arva Chafe, MD - Primary    Assistant: None    Findings: Retained hardware left distal fibula    Anesthesia: Preop Block + General Anesthesia    Estimated Blood Loss: 5 mL    Complications: None    Specimens: None collected    Implants:   Implant Name Type Inv. Item Serial No. Manufacturer Lot No. LRB No. Used Action   SCREWS       Left 5 Explanted   PLATE      Left 1 Explanted       Surgeon Notes: I was present and scrubbed for the entire procedure    Tourniquet: 10 minutes    Amedeo Kinsman   Date: 07/20/2021  Time: 1:32 PM

## 2021-07-20 NOTE — Unmapped (Signed)
Anesthesia Side Effects:                 Today we gave you an anesthetic or a sedative.     1. We require you have a responsible adult with you the rest of the day and overnight, for your safety.     2. You should NOT do the following for the next 24 hours or at any time while taking prescription pain medicine:  a. Do not drink alcohol  b. Do not sign legal documents  c. Do not drive a car or operate heavy machinery  d. Do not use power tools    3. You may have a very dry mouth and sore throat. Warm saltwater gargles, drinking cool or warm beverages may help to soothe your throat.     4. It is normal to feel tired and weak after receiving anesthesia. You should limit your activities today and rest at home.     5. After anesthesia, it is better to start with a light liquid diet such as soft drinks, tea, Jell-O or broth.  Next you can try soup and crackers.   Finally work up to solid foods. Please remember that it is very important for you to drink liquids during the next 24 hours.    6. The amount of pain you have may differ from what other people experience.  Your pain may be unpredictable. Call your surgeon if you cannot control your pain by using the pain medicine you were prescribed.    7. If the patient is a child, you should watch the child closely to prevent accidents. A child may be slower to react and his or her sense of balance may be off for the next 24 hours.

## 2021-07-24 NOTE — Unmapped (Signed)
[facility] Refill Coordination Note    Specialty Medication(s) to be Shipped:   General Specialty: mycophenolate 500mg     Other medication(s) to be shipped: No additional medications requested for fill at this time     Amber Bush, DOB: December 11, 1955  Phone: There are no phone numbers on file.      All above HIPAA information was verified with patient.     Was a Nurse, learning disability used for this call? No    Completed refill call assessment today to schedule patient's medication shipment from the [facility] [phone]).  All relevant notes have been reviewed.     Specialty medication(s) and dose(s) confirmed: Regimen is correct and unchanged.   Changes to medications: Amber Bush reports no changes at this time.  Changes to insurance: No  New side effects reported not previously addressed with a pharmacist or physician: None reported  Questions for the pharmacist: No    Confirmed patient received a Conservation officer, historic buildings and a Surveyor, mining with first shipment. The patient will receive a drug information handout for each medication shipped and additional FDA Medication Guides as required.       DISEASE/MEDICATION-SPECIFIC INFORMATION        N/A    SPECIALTY MEDICATION ADHERENCE     Medication Adherence    Patient reported X missed doses in the last month: 0  Specialty Medication: Mycophenolate 500mg   Patient is on additional specialty medications: No  Patient is on more than two specialty medications: No              Were doses missed due to medication being on hold? No    Mycophenolate 500 mg: 14 days of medicine on hand       REFERRAL TO PHARMACIST     Referral to the pharmacist: Not needed      [facility]     Shipping address confirmed in Epic.     Delivery Scheduled: Yes, Expected medication delivery date: 08/03/21.     Medication will be delivered via UPS to the prescription address in Epic WAM.    Nancy Nordmann [facility] Specialty Technician

## 2021-08-02 MED FILL — MYCOPHENOLATE MOFETIL 500 MG TABLET: ORAL | 30 days supply | Qty: 60 | Fill #3

## 2021-08-04 DIAGNOSIS — R062 Wheezing: Secondary | ICD-10-CM | POA: Diagnosis not present

## 2021-08-04 DIAGNOSIS — J849 Interstitial pulmonary disease, unspecified: Secondary | ICD-10-CM | POA: Diagnosis not present

## 2021-08-04 DIAGNOSIS — G4733 Obstructive sleep apnea (adult) (pediatric): Secondary | ICD-10-CM | POA: Diagnosis not present

## 2021-08-22 NOTE — Unmapped (Signed)
The [facility] has made a second and final attempt to reach this patient to refill the following medication:Mycophenolate.      We have left voicemails on the following phone numbers: [phone].    Dates contacted: 08/22/21, 08/29/21  Last scheduled delivery: 08/02/21    The patient may be at risk of non-compliance with this medication. The patient should call the [facility] at [phone]  Option 4, then Option 2 (all other specialty patients) to refill medication.    Ron Parker   [facility] Specialty Technician            08/22/21 Pt said she has new insurance that started on 08/16/21. But she does not have the card yet. She said she will call us back once she gets the new insurance information. sed

## 2021-09-04 NOTE — Unmapped (Signed)
[facility] Special[facility] Pharmacy Refill Coordination Note    Specialty Medication(s) to be Shipped:   General Specialty: mycophenolate 500mg     Other medication(s) to be shipped: No additional medications requested for fill at this time     [name], DOB: 01-12-1956  Phone: There are no phone numbers on file.      All above HIPAA information was verified with patient.     Was a Nurse, learning disability used for this call? No    Completed refill call assessment today to schedule patient's medication shipment from the [facility] [phone]).  All relevant notes have been reviewed.     Specialty medication(s) and dose(s) confirmed: Regimen is correct and unchanged.   Changes to medications: [name] reports no changes at this time.  Changes to insurance: No  New side effects reported not previously addressed with a pharmacist or physician: None reported  Questions for the pharmacist: No    Confirmed patient received a Conservation officer, historic buildings and a Surveyor, mining with first shipment. The patient will receive a drug information handout for each medication shipped and additional FDA Medication Guides as required.       DISEASE/MEDICATION-SPECIFIC INFORMATION        N/A    SPECIALTY MEDICATION ADHERENCE     Medication Adherence    Patient reported X missed doses in the last month: 0  Specialty Medication: Mycophenolate 500mg   Patient is on additional specialty medications: No        Were doses missed due to medication being on hold? No    Mycophenolate 500 mg: 9 days of medicine on hand     REFERRAL TO PHARMACIST     Referral to the pharmacist: Not needed      [facility]     Shipping address confirmed in Epic.     Delivery Scheduled: Yes, Expected medication delivery date: 09/12/2021.     Medication will be delivered via UPS to the prescription address in Epic WAM.    Lorelei Pont [employer] [facility] Specialty Technician

## 2021-09-05 DIAGNOSIS — J189 Pneumonia, unspecified organism: Principal | ICD-10-CM

## 2021-09-05 MED ORDER — CODEINE 10 MG-GUAIFENESIN 100 MG/5 ML ORAL LIQUID
Freq: Three times a day (TID) | ORAL | 0 refills | 8 days | Status: CP | PRN
Start: 2021-09-05 — End: ?

## 2021-09-05 MED ORDER — AMOXICILLIN 875 MG-POTASSIUM CLAVULANATE 125 MG TABLET
ORAL_TABLET | Freq: Two times a day (BID) | ORAL | 0 refills | 14.00000 days | Status: CP
Start: 2021-09-05 — End: 2021-09-19

## 2021-09-10 MED FILL — MYCOPHENOLATE MOFETIL 500 MG TABLET: ORAL | 30 days supply | Qty: 60 | Fill #4

## 2021-09-25 ENCOUNTER — Telehealth: Payer: Self-pay | Admitting: Psychiatry

## 2021-09-25 ENCOUNTER — Other Ambulatory Visit: Payer: Self-pay

## 2021-09-25 DIAGNOSIS — F411 Generalized anxiety disorder: Secondary | ICD-10-CM

## 2021-09-25 MED ORDER — ALPRAZOLAM 1 MG PO TABS: ORAL_TABLET | ORAL | 0 refills | Status: DC

## 2021-09-25 NOTE — Telephone Encounter (Signed)
Pended.

## 2021-09-25 NOTE — Telephone Encounter (Signed)
Next visit is 10/08/21. Requesting refill on Alprazolam #90 called to:  [facility] [id number] - [place], [place]  Phone:  [phone]  Fax:  [phone]

## 2021-10-01 ENCOUNTER — Other Ambulatory Visit: Payer: Self-pay

## 2021-10-01 ENCOUNTER — Telehealth: Payer: Self-pay | Admitting: Psychiatry

## 2021-10-01 DIAGNOSIS — F330 Major depressive disorder, recurrent, mild: Secondary | ICD-10-CM

## 2021-10-01 DIAGNOSIS — F411 Generalized anxiety disorder: Secondary | ICD-10-CM

## 2021-10-01 MED ORDER — PAROXETINE HCL 30 MG PO TABS: 30.0000 mg | ORAL_TABLET | Freq: Every day | ORAL | 0 refills | Status: DC

## 2021-10-01 NOTE — Telephone Encounter (Signed)
Rx sent 

## 2021-10-01 NOTE — Telephone Encounter (Signed)
Please send in a 90 day RF of Paxil 30mg  to new pharmacy. She moved to [place]. [id number] - [place], [place] - [address]

## 2021-10-08 ENCOUNTER — Encounter: Payer: Self-pay | Admitting: Psychiatry

## 2021-10-08 ENCOUNTER — Telehealth (INDEPENDENT_AMBULATORY_CARE_PROVIDER_SITE_OTHER): Payer: Medicare Other | Admitting: Psychiatry

## 2021-10-08 DIAGNOSIS — F330 Major depressive disorder, recurrent, mild: Secondary | ICD-10-CM | POA: Diagnosis not present

## 2021-10-08 DIAGNOSIS — F5101 Primary insomnia: Secondary | ICD-10-CM | POA: Diagnosis not present

## 2021-10-08 DIAGNOSIS — F411 Generalized anxiety disorder: Secondary | ICD-10-CM

## 2021-10-08 DIAGNOSIS — F332 Major depressive disorder, recurrent severe without psychotic features: Secondary | ICD-10-CM | POA: Diagnosis not present

## 2021-10-08 MED ORDER — ALPRAZOLAM 1 MG PO TABS: ORAL_TABLET | ORAL | 0 refills | Status: DC

## 2021-10-08 MED ORDER — LAMOTRIGINE 100 MG PO TABS: 100.0000 mg | ORAL_TABLET | Freq: Every day | ORAL | 1 refills | Status: DC

## 2021-10-08 MED ORDER — TEMAZEPAM 30 MG PO CAPS: 30.0000 mg | ORAL_CAPSULE | Freq: Every day | ORAL | 0 refills | Status: DC

## 2021-10-08 MED ORDER — PAROXETINE HCL 30 MG PO TABS: 30.0000 mg | ORAL_TABLET | Freq: Every day | ORAL | 0 refills | Status: DC

## 2021-10-08 NOTE — Unmapped (Unsigned)
[facility] ESTABLISHED PATIENT VISIT      Patient Name: Amber Bush: 66 y.o. (23-Feb-1956)  Date of Encounter: 10/08/2021  Primary Care Provider:  Royce Macadamia, MD  Referring Provider: Ernie Hew    ASSESSMENT & PLAN:     {No diagnosis found. 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  She was treated with Xarelto x 6 months.  No issues since.  - s/p IVC filter placement on 07/17/21 with Dr. Selena Batten  - Postoperative anticoagulation with 2.5 mg of Eliquis twice daily x4 weeks starting the day of her procedure.    - Now fully healed and full weight bearing of LLE  - I independently visualized and reviewed the available imaging. BLE venous duplex today shows ***  - Will plan for IVC filter retrieval with Dr. Selena Batten  - Full discussion of procedure / risks / benefits / alternatives to the procedure were discussed today.  Risks involve and are not limited to:  Bleeding, wound complication, wound infection, loss of limb, cardiovascular event (MI, respiratory failure, DVT, PE, stroke), and/or death.  Risks associated with contrast dye: allergic reaction, nephrotoxicity / renal insufficiency, small risk of renal failure (potentially requiring temporary or permanent dialysis).   No successful outcome from surgery was made or guaranteed during our discussion today. Patient is agreeable to proceed with the planned surgical intervention.       Left Ankle Pain  - Ortho notes reviewed. Hx prior left ankle fracture fracture from a fall.    - S/P removal of hardware from left ankle with Dr Lissa Hoard on 07/20/2021.    - Patient is in a boot and able to bear weight in boot after her surgery  ??  Hypertension  - Chronic. Stable. BP below goal < 130/80  - home meds: Norvasc, lisinopril  - BP *** in office today  - Recommend nonpharmacological interventions including weight loss, DASH diet, sodium reduction, increased physical activity, and reducing alcohol consumption (limit 2 for males and 1 for females)  -Follows with primary care    - Patient will return sooner or present to the ED if symptoms worsen or change.       HISTORY OF PRESENT ILLNESS:     CHIEF COMPLAINT: ***    BRIEF HISTORY:     Amber Bush (11-28-1955) is a 66 y.o. female with PMHx detailed below, who presents as an established patient in follow up of IVC filter.  Patient has a history of DVT and PE in 2018.  She had upcoming orthopedic surgery and was recommended for evaluation of IVC filter given this history.  IVC filter was placed 07/17/21 by Dr. Selena Batten.  Patient is status post orthopedic surgery on 07/20/2021.  She was anticoagulated with 2.5 mg twice daily Eliquis for 4 weeks postoperatively.  She has now fully recovered and weightbearing. ***    No other complaints at this time. All other systems reviewed are negative.    Smoking Status: {atksmoking:71955}.      VASCULAR HISTORY:     07/17/21 - IVC filter placement Selena Batten)    PMFSHx:     Past Medical History:   Diagnosis Date   ??? Anxiety    ??? Colon polyp    ??? Depression    ??? Diverticulitis of colon    ??? DVT (deep vein  thrombosis) in pregnancy    ??? Fibrocystic breast    ??? GERD (gastroesophageal reflux disease)    ??? Hypertension    ??? ILD (interstitial lung disease) (CMS-HCC)    ??? Interstitial lung disease (CMS-HCC)    ??? OSA (obstructive sleep apnea)    ??? PE (pulmonary thromboembolism) (CMS-HCC)    ??? Sciatica    ??? Sleep apnea          Past Surgical History:   Procedure Laterality Date   ??? CHOLECYSTECTOMY     ??? HYSTERECTOMY      partial   ??? IC IVC FILTER PLACEMENT ([place] HISTORICAL RESULT)     ??? ORIF ANKLE FRACTURE Left    ??? PR CATH PLACE/CORON ANGIO, IMG SUPER/INTERP,R&L HRT CATH, L HRT VENTRIC N/A 04/02/2021    Procedure: Left/Right Heart Catheterization;  Surgeon: Neal Dy, MD;  Location: [facility] CATH;  Service: Cardiology   ??? PR ESOPHAGEAL MOTILITY STUDY, MANOMETRY N/A 01/16/2017    Procedure: ESOPHAGEAL MOTILITY STUDY W/INT & REP;  Surgeon: Nurse-Based Giproc;  [facility]  Service: Gastroenterology   ??? PR GERD TST W/ NASAL IMPEDENCE ELECTROD N/A 01/16/2017    Procedure: ESOPH FUNCT TST NASL ELEC PLCMT;  Surgeon: Nurse-Based Giproc;  [facility];  Service: Gastroenterology   ??? PR GERD TST W/ NASAL PH ELECTROD N/A 01/16/2017    Procedure: ESOPHAGEAL 24 HOUR PH MONITORING;  Surgeon: Nurse-Based Giproc;  [facility]: [facility];  Service: Gastroenterology   ??? PR INS INTRVAS VC FILTR W/WO VAS ACS VSL SELXN RS&I Right 07/17/2021    Procedure: INFERIOR VENA CAVA FILTER PLACEMENT;  Surgeon: Ernie Hew, MD;  Location: [facility] CATH;  Service: Cardiology   ??? PR REMOVAL DEEP IMPLANT Left 07/20/2021    Procedure: REMOVAL OF LEFT ANKLE HARDWARE;  Surgeon: Arva Chafe, MD;  Location: OR ;  Service: Orthopedics   ??? PR UP GI ENDOSCOPY,BALL DIL,30MM N/A 10/28/2017    Procedure: UGI ENDO; W/BALLOON DILAT ESOPHAGUS (<30MM DIAM);  Surgeon: Liane Comber, MD;  Location: [facility];  Service: Gastroenterology   ??? PR UPPER GI ENDOSCOPY,BIOPSY Left 10/28/2017    Procedure: UGI ENDOSCOPY; WITH BIOPSY, SINGLE OR MULTIPLE;  Surgeon: Liane Comber, MD;  Location: [facility];  Service: Gastroenterology   ??? ROTATOR CUFF REPAIR Right 07/2016         Family History   Problem Relation Age of Onset   ??? Heart disease Mother    ??? Hypertension Mother    ??? Kidney disease Mother    ??? Heart disease Father    ??? Hypertension Father    ??? Kidney disease Father          Social History     Socioeconomic History   ??? Marital status: Widowed   Tobacco Use   ??? Smoking status: Never   ??? Smokeless tobacco: Never   Vaping Use   ??? Vaping Use: Never used   Substance and Sexual Activity   ??? Alcohol use: No     Alcohol/week: 0.0 standard drinks   ??? Drug use: No   ??? Sexual activity: Not Currently         Current Allergy List  Allergies   Allergen Reactions   ??? Trazodone Other (See Comments)     Paresthesias, weakness   ??? Sulfa (Sulfonamide Antibiotics) Rash       Current Medication List  Current Outpatient Medications   Medication Sig Dispense Refill   ???  acetaminophen (TYLENOL) 325 MG tablet Take 2 tablets (650 mg total) by mouth every six (6) hours as needed for pain. Start this when you get home and take as a scheduled medication through Sunday.  Then take as needed for pain as directed on the bottle. 100 tablet 2   ??? ALPRAZolam (XANAX) 1 MG tablet Take 1 mg by mouth every morning.     ??? amLODIPine (NORVASC) 5 MG tablet Take 2.5 mg by mouth every morning.     ??? apixaban (ELIQUIS) 2.5 mg Tab Take 1 tablet (2.5 mg total) by mouth Two (2) times a day.  0   ??? baclofen (LIORESAL) 10 MG tablet Take 10 mg by mouth nightly.     ??? cetirizine (ZYRTEC) 10 MG tablet Take 10 mg by mouth every morning.     ??? codeine-guaifenesin (GUAIFENESIN AC) 10-100 mg/5 mL liquid Take 5 mL by mouth Three (3) times a day as needed for cough. 118 mL 0   ??? fenofibrate (LOFIBRA) 160 MG tablet Take 160 mg by mouth every morning.     ??? fluticasone (FLONASE) 50 mcg/actuation nasal spray 1 spray into each nostril every morning.     ??? lamoTRIgine (LAMICTAL) 100 MG tablet Take 100 mg by mouth every morning.     ??? lisinopril (PRINIVIL,ZESTRIL) 20 MG tablet Take 20 mg by mouth every morning.     ??? mycophenolate (CELLCEPT) 500 mg tablet Take 1 tablet (500 mg total) by mouth Two (2) times a day. 60 tablet 11   ??? pantoprazole (PROTONIX) 40 MG tablet Take 40 mg by mouth every morning.     ??? PARoxetine (PAXIL) 30 MG tablet Take 30 mg by mouth every morning.     ??? pravastatin (PRAVACHOL) 40 MG tablet Take 40 mg by mouth every morning.     ??? predniSONE (DELTASONE) 2.5 MG tablet Take 1 tablet (2.5 mg total) by mouth daily. (Patient taking differently: Take 2.5 mg by mouth every morning.) 90 tablet 3   ??? promethazine (PHENERGAN) 25 MG tablet Take 25 mg by mouth every six (6) hours as needed for nausea.     ??? temAZEpam (RESTORIL) 30 mg capsule Take 30 mg by mouth nightly as needed for sleep.       No current facility-administered medications for this visit.       CLINICAL EXAM:     There were no vitals filed for this visit.  There is no height or weight on file to calculate BMI.      Physical Exam  Vascular Evaluation:    Right:  Radial: {RVSPULSE:73351}, DP: {RVSPULSE:73351},  PT: {RVSPULSE:73351}        RLE: No edema, varicosities, or deformities.           Right foot: No ulcer, blister, skin breakdown, erythema, warmth, callus, dry skin or fissure. Right toenails are normal.     Left:  Radial: {RVSPULSE:73351}, DP: {RVSPULSE:73351},  PT: {RVSPULSE:73351}        LLE: No edema, varicosities, or deformities.           Left foot: No ulcer, blister, skin breakdown, erythema, warmth, callus, dry skin or fissure. Left toenails are normal.     Skin:  Warm and dry. No pallor or jaundice. Capillary refill < 3 sec. No bruising, erythema, lesion, hematoma, or rash.     Pulmonary:      Effort: Pulmonary effort is normal.     Neurological:  No focal deficit present. Cranial nerves II-XII are intact.  ANCILLARY DATA:     NA         Idil Maslanka V.L. Lesly Dukes, FNP-C  [facility]  [address], [address]  [place], [place] [address]  [phone] (office)  [phone] (fax)      This note was generated utilizing Dragon autodictation software.  Attempt has been made to correct errors but residual errors may remain and do not reflect the care provided.

## 2021-10-08 NOTE — Unmapped (Signed)
[facility] Pharmacy Refill Coordination Note    Specialty Medication(s) to be Shipped:   General Specialty: mycophenolate 500mg     Other medication(s) to be shipped: No additional medications requested for fill at this time     [name], DOB: 07/07/1956  Phone: There are no phone numbers on file.      All above HIPAA information was verified with patient.     Was a Nurse, learning disability used for this call? No    Completed refill call assessment today to schedule patient's medication shipment from the [facility] [phone]).  All relevant notes have been reviewed.     Specialty medication(s) and dose(s) confirmed: Regimen is correct and unchanged.   Changes to medications: [name] reports no changes at this time.  Changes to insurance: No  New side effects reported not previously addressed with a pharmacist or physician: None reported  Questions for the pharmacist: No    Confirmed patient received a Conservation officer, historic buildings and a Surveyor, mining with first shipment. The patient will receive a drug information handout for each medication shipped and additional FDA Medication Guides as required.       DISEASE/MEDICATION-SPECIFIC INFORMATION        N/A    SPECIALTY MEDICATION ADHERENCE     Medication Adherence    Patient reported X missed doses in the last month: 0  Specialty Medication: Mycophenolate 500mg   Patient is on additional specialty medications: No  Patient is on more than two specialty medications: No  Any gaps in refill history greater than 2 weeks in the last 3 months: no  Demonstrates understanding of importance of adherence: yes  Informant: patient  Reliability of informant: reliable  Confirmed plan for next specialty medication refill: delivery by pharmacy  Refills needed for supportive medications: not needed              Were doses missed due to medication being on hold? No    mycophenolate 500 mg : 10 days of medicine on hand        REFERRAL TO PHARMACIST Referral to the pharmacist: Not needed      [facility]     Shipping address confirmed in Epic.     Delivery Scheduled: Yes, Expected medication delivery date: 10/17/21.     Medication will be delivered via UPS to the prescription address in Epic WAM.    Yolonda Kida   [facility] Specialty Technician

## 2021-10-08 NOTE — Unmapped (Signed)
The [facility] has made a second and final attempt to reach this patient to refill the following medication:Mycophenolate.      We have left voicemails on the following phone numbers: [phone], Mychart.    Dates contacted: 10/03/21, 10/08/21  Last scheduled delivery: 09/10/21    The patient may be at risk of non-compliance with this medication. The patient should call the [facility] at [phone]  Option 4, then Option 2 (all other specialty patients) to refill medication.    Bartow Zylstra Celedonio Savage   [facility]

## 2021-10-08 NOTE — Progress Notes (Signed)
Stacey Alvarez [id number] 1955-12-28 66 y.o.  Virtual Visit via Telephone Note  I connected with pt @ on 10/08/21 at 12:45 PM EST by telephone and verified that I am speaking with the correct person using two identifiers.   I discussed the limitations of evaluation and management by telemedicine and the availability of in person appointments. The patient expressed understanding and agreed to proceed.  I discussed the assessment and treatment plan with the patient. The patient was provided an opportunity to ask questions and all were answered. The patient agreed with the plan and demonstrated an understanding of the instructions.   The patient was advised to call back or seek an in-person evaluation if the symptoms worsen or if the condition fails to improve as anticipated.  I provided 30 minutes of non-face-to-face time during this encounter.  The patient was located at home.  The provider was located at [place].   Thayer Headings, PMHNP   Subjective:   Patient ID:  Stacey Alvarez is a 66 y.o. (DOB 12-13-1955) female.  Chief Complaint:  Chief Complaint  Patient presents with   Follow-up    Depression, anxiety, and insomnia    HPI Stacey Alvarez presents for follow-up of depression, anxiety, and insomnia. She is living in [place] and "I like it here." Able to see her daughter her regularly and old friends. She is enjoying her new home. Visited friend in [place] recently and friend will come to visit soon.   She reports, "I almost live in fear." She reports that she feels like se is waiting for the other shoe to drop and notices a "knot in her stomach." She reports that her motivation remains low. She reports that she will put off chores. She reports difficulty finding joy in things. "I think I am better than I have been, but I think I still need to motivate more." Sleeping well. She reports poor concentration. She reports that she will start one task and then switch to  another task before completing the initial task. Has not been reading recently although there are several books she is interested in. She reports that hopelessness has improved. Denies SI.   She reports taking Xanax one tablet in the morning and 2 at bedtime. Occasionally will take prn in the afternoon.   She has had difficulty sorting through sentimental things.   She reports that she had a URI over the holidays.   Sees Beckey Downing, LCAS for therapy.   She reports that she is continuing to grieve the loss of her son and husband. She reports that she is no longer crying whenever she thinks of them.    Past Psychiatric Medication Trials: (Reports sensitivity to medication) Lamictal- reports that 150 mg felt that this was too high. Lithium- Ineffective Paxil- Took years ago in combination with other medications Lexapro-ineffective Zoloft- ineffective Prozac- Has tremor and unsteadiness on 80 mg. Has taken about 5 years. Unsure of benefit. Effexor XR- adverse reaction. Confusion. Cymbalta- Ineffective.  Remeron- Wt gain. Trintellix- GI side effects. Some partial improvement., Itching on palms Wellbutrin- rash during clinical trials.  Gabapentin- felt unsteady. Suppressed breathing.  Lyrica-Suppressed breathing. Temazepam Xanax Abilify-Ineffective.  Risperdal- wt gain Rexulti- Akathisia  Latuda        Viibryd- Reports that Dr. Toy Care has told her that she was on it and she does not recall this.  Propranolol- felt "revved up."  Trazodone- Adverse effects. Ritalin- shortness of breath  Review of Systems:  Review of Systems  Respiratory:  Occ shortness of breath when anxious  Musculoskeletal:  Negative for gait problem.       Had hardware removed from her ankle and reports this went well. Pain radiating to hip  Psychiatric/Behavioral:         Please refer to HPI   Medications: I have reviewed the patient's current medications.  Current Outpatient Medications  Medication  Sig Dispense Refill   amLODipine (NORVASC) 5 MG tablet TAKE 1/2 TABLET BY MOUTH IN THE MORNING AND 1 TABLET AT BEDTIME (Patient taking differently: 5 mg daily.) 135 tablet 3   butalbital-aspirin-caffeine-codeine (FIORINAL WITH CODEINE) 50-325-40-30 MG capsule Take 1 capsule by mouth every 6 (six) hours as needed for pain. 21 capsule 0   cetirizine (ZYRTEC) 10 MG tablet Take 10 mg by mouth daily.     fenofibrate 160 MG tablet Take 1 tablet (160 mg total) by mouth daily. 30 tablet 0   fluticasone (FLONASE) 50 MCG/ACT nasal spray Place 1 spray into both nostrils daily.     lisinopril (ZESTRIL) 20 MG tablet Take 1 tablet (20 mg total) by mouth daily. 90 tablet 0   mycophenolate (CELLCEPT) 500 MG tablet Take 500 mg by mouth 2 (two) times daily.      pantoprazole (PROTONIX) 40 MG tablet Take 1 tablet (40 mg total) by mouth 2 (two) times daily. (Patient taking differently: Take 40 mg by mouth daily.) 120 tablet 0   pravastatin (PRAVACHOL) 40 MG tablet Take 1 tablet (40 mg total) by mouth daily. Need appointment 90 tablet 0   predniSONE (DELTASONE) 2.5 MG tablet Take 2.5 mg by mouth daily.     [START ON 12/18/2021] ALPRAZolam (XANAX) 1 MG tablet TAKE ONE TABLET BY MOUTH FOUR TIMES A DAY AS NEEDED FOR ANXIETY 360 tablet 0   azithromycin (ZITHROMAX Z-PAK) 250 MG tablet As directed (Patient not taking: Reported on 06/11/2021) 6 tablet 0   cyanocobalamin (,VITAMIN B-12,) 1000 MCG/ML injection Inject 1 mL into the skin once a week for 4 weeks, then once a month for 5 months. (Patient not taking: Reported on 11/30/2020) 1 mL 8   lamoTRIgine (LAMICTAL) 100 MG tablet Take 1 tablet (100 mg total) by mouth daily. 90 tablet 1   OXYGEN Inhale 2 L into the lungs as needed (SOB).     PARoxetine (PAXIL) 30 MG tablet Take 1 tablet (30 mg total) by mouth daily. 90 tablet 0   pregabalin (LYRICA) 75 MG capsule Take 1 capsule (75 mg total) by mouth 2 (two) times daily. (Patient not taking: Reported on 11/30/2020) 60 capsule 1    promethazine (PHENERGAN) 25 MG tablet TAKE ONE TABLET BY MOUTH EVERY 8 HOURS AS NEEDED FOR FOR NAUSEA AND VOMITING 30 tablet 0   SYRINGE-NEEDLE, DISP, 3 ML (B-D 3CC LUER-LOK SYR 25GX1") 25G X 1" 3 ML MISC USE TO ADMINSTER B12 INJECTIONS 1 TIME A WEEK FOR 4 WEEKS THEN ONCE MONTHLY FOR 5 MONTH 10 each 0   [START ON 12/31/2021] temazepam (RESTORIL) 30 MG capsule Take 1 capsule (30 mg total) by mouth at bedtime. 90 capsule 0   No current facility-administered medications for this visit.    Medication Side Effects: None  Allergies:  Allergies  Allergen Reactions   Lisinopril     COUGH    Trazodone And Nefazodone     Paresthesias, weakness   Trintellix [Vortioxetine]    Macrobid [Nitrofurantoin Monohyd Macro] Diarrhea    Diarrhea, GI upset   Sulfa Antibiotics Rash    Past Medical History:  Diagnosis Date  Allergy    Anxiety    Arthritis    KNEES,BACK,HIPS   Depression    Fibromyalgia    GERD (gastroesophageal reflux disease)    Hyperlipidemia    Hypertension    IBS (irritable bowel syndrome)    Interstitial lung disease (HCC)    Pulmonary embolism ([place]) 09/2016   provoked- s/p rotator cuff repair   Sleep apnea     Family History  Problem Relation Age of Onset   Diabetes Mother    Heart disease Mother    Anxiety disorder Mother    Depression Mother    Kidney disease Mother    Diabetes Father    Heart disease Father    Anxiety disorder Father    Depression Father    Diabetes Paternal Grandmother    Cystic fibrosis Son    Alcohol abuse Son    Drug abuse Son    Anxiety disorder Son    Depression Son    Breast cancer Maternal Aunt    Breast cancer Maternal Aunt     Social History   Socioeconomic History   Marital status: Widowed    Spouse name: Not on file   Number of children: 2   Years of education: 12   Highest education level: High school graduate  Occupational History   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   Vaping Use   Vaping Use: Never used  Substance and Sexual Activity   Alcohol use: No    Alcohol/week: 0.0 standard drinks   Drug use: No   Sexual activity: Not on file  Other Topics Concern   Not on file  Social History Narrative   Lives alone, widowed. Has one dtr. in [place]. [place] and one son in [place]./fim   Social Determinants of Health   Financial Resource Strain: Not on file  Food Insecurity: Not on file  Transportation Needs: Not on file  Physical Activity: Not on file  Stress: Not on file  Social Connections: Not on file  Intimate Partner Violence: Not on file    Past Medical History, Surgical history, Social history, and Family history were reviewed and updated as appropriate.   Please see review of systems for further details on the patient's review from today.   Objective:   Physical Exam:  There were no vitals taken for this visit.  Physical Exam Neurological:     Mental Status: She is alert and oriented to person, place, and time.     Cranial Nerves: No dysarthria.  Psychiatric:        Attention and Perception: Attention and perception normal.        Mood and Affect: Mood is anxious.        Speech: Speech normal.        Behavior: Behavior is cooperative.        Thought Content: Thought content normal. Thought content is not paranoid or delusional. Thought content does not include homicidal or suicidal ideation. Thought content does not include homicidal or suicidal plan.        Cognition and Memory: Cognition and memory normal.        Judgment: Judgment normal.     Comments: Insight intact Mood is mildly depressed    Lab Review:     Component Value Date/Time   NA 137 10/09/2020 1447  NA 138 07/14/2020 1020   K 4.0 10/09/2020 1447   CL 102 10/09/2020 1447   CO2 28 10/09/2020 1447   GLUCOSE 102 (H) 10/09/2020 1447   BUN 13  10/09/2020 1447   BUN 13 07/14/2020 1020   CREATININE 0.81 10/09/2020 1447   CALCIUM 9.5 10/09/2020 1447   PROT 7.1 10/09/2020 1447   ALBUMIN 4.5 10/09/2020 1447   AST 13 10/09/2020 1447   ALT 10 10/09/2020 1447   ALKPHOS 74 10/09/2020 1447   BILITOT 0.4 10/09/2020 1447   GFRNONAA 77 07/14/2020 1020   GFRAA 89 07/14/2020 1020       Component Value Date/Time   WBC 7.8 10/09/2020 1447   RBC 4.88 10/09/2020 1447   HGB 13.0 10/09/2020 1447   HCT 39.4 10/09/2020 1447   PLT 262.0 10/09/2020 1447   MCV 80.8 10/09/2020 1447   MCH 26.1 05/17/2020 1146   MCHC 33.0 10/09/2020 1447   RDW 14.1 10/09/2020 1447   LYMPHSABS 1.6 10/09/2020 1447   MONOABS 0.4 10/09/2020 1447   EOSABS 0.1 10/09/2020 1447   BASOSABS 0.1 10/09/2020 1447    No results found for: POCLITH, LITHIUM   No results found for: PHENYTOIN, PHENOBARB, VALPROATE, CBMZ   .res Assessment: Plan:    Pt seen for 30 minutes and time spent discussing recent s/s and adjustment to relocation. She reports that she would like to continue current medications without changes. Continue Lamictal 100 mg po qd for mood s/s.  Continue Xanax 1 mg four times daily as needed. Continue Paxil 30 mg po qd for anxiety and depression.  Continue Temazepam 30 mg po QHS for insomnia.  Recommend continuing therapy with Beckey Downing, LCAS.  Pt to follow-up in 3 months or sooner if clinically indicated.  Patient advised to contact office with any questions, adverse effects, or acute worsening in signs and symptoms.   Cheral was seen today for follow-up.  Diagnoses and all orders for this visit:  Primary insomnia -     temazepam (RESTORIL) 30 MG capsule; Take 1 capsule (30 mg total) by mouth at bedtime.  Severe episode of recurrent major depressive disorder, without psychotic features ([place]) -     lamoTRIgine (LAMICTAL) 100 MG tablet; Take 1 tablet (100 mg total) by mouth daily.  Generalized anxiety disorder -     PARoxetine (PAXIL) 30 MG  tablet; Take 1 tablet (30 mg total) by mouth daily. -     ALPRAZolam (XANAX) 1 MG tablet; TAKE ONE TABLET BY MOUTH FOUR TIMES A DAY AS NEEDED FOR ANXIETY  Mild episode of recurrent major depressive disorder (HCC) -     PARoxetine (PAXIL) 30 MG tablet; Take 1 tablet (30 mg total) by mouth daily.     Please see After Visit Summary for patient specific instructions.  Future Appointments  Date Time Provider [place]  01/07/2022 12:45 PM Thayer Headings, PMHNP CP-CP None    No orders of the defined types were placed in this encounter.     -------------------------------

## 2021-10-10 ENCOUNTER — Ambulatory Visit: Admit: 2021-10-10 | Discharge: 2021-10-11 | Payer: MEDICARE

## 2021-10-10 DIAGNOSIS — M47816 Spondylosis without myelopathy or radiculopathy, lumbar region: Principal | ICD-10-CM

## 2021-10-10 DIAGNOSIS — M5416 Radiculopathy, lumbar region: Principal | ICD-10-CM

## 2021-10-10 MED ORDER — METHYLPREDNISOLONE 4 MG TABLETS IN A DOSE PACK
0 refills | 0 days | Status: CP
Start: 2021-10-10 — End: ?

## 2021-10-10 NOTE — Unmapped (Signed)
10/10/2021     Dear Dr. Montez Morita:    Thank you for your referral. We had the pleasure of seeing your patient today and attached you will find a copy of our office note.    Chief Complaint: back pain and leg pain on the right     History of Present Illness: Amber Bush is a very pleasant 67 year old women who has just moved back to the area from [place], [place]. She has been having right sided back and leg pain that has increasingly gotten worse since November. The pain is constant and increases with walking or standing for long periods of time. She can not lay on her right side for long periods of time at night. She says the pain shoots down the lateral side of her right leg, but does not go into her foot or toes. She describes the pain in her back as a burning pain. She reports that she has had a few falls, because of her leg pain. She takes a daily 2.5 mg prednisone due to interstitial lung disease.  She has also tried a muscle relaxer by her PCP with minimal relief. She has been doing at home PT stretches that have not helped. She has been told that she also may have some bursitis in her right hip.  She has had bursa injection with some relief.    Review of Systems: as in HPI otherwise 12 system review was negative    Past Medical History:   Past Medical History:   Diagnosis Date   ??? Anxiety    ??? Colon polyp    ??? Depression    ??? Diverticulitis of colon    ??? DVT (deep vein thrombosis) in pregnancy    ??? Fibrocystic breast    ??? GERD (gastroesophageal reflux disease)    ??? Hypertension    ??? ILD (interstitial lung disease) (CMS-HCC)    ??? Interstitial lung disease (CMS-HCC)    ??? OSA (obstructive sleep apnea)    ??? PE (pulmonary thromboembolism) (CMS-HCC)    ??? Sciatica    ??? Sleep apnea        Past Surgical History:    Past Surgical History:   Procedure Laterality Date   ??? CHOLECYSTECTOMY     ??? HYSTERECTOMY      partial   ??? IC IVC FILTER PLACEMENT ([place] HISTORICAL RESULT)     ??? ORIF ANKLE FRACTURE Left    ??? PR CATH PLACE/CORON ANGIO, IMG SUPER/INTERP,R&L HRT CATH, L HRT VENTRIC N/A 04/02/2021    Procedure: Left/Right Heart Catheterization;  Surgeon: Neal Dy, MD;  Location: [facility] CATH;  Service: Cardiology   ??? PR ESOPHAGEAL MOTILITY STUDY, MANOMETRY N/A 01/16/2017    Procedure: ESOPHAGEAL MOTILITY STUDY W/INT & REP;  Surgeon: Nurse-Based Giproc;  [facility]: [facility];  Service: Gastroenterology   ??? PR GERD TST W/ NASAL IMPEDENCE ELECTROD N/A 01/16/2017    Procedure: ESOPH FUNCT TST NASL ELEC PLCMT;  Surgeon: Nurse-Based Giproc;  Location: [facility];  Service: Gastroenterology   ??? PR GERD TST W/ NASAL PH ELECTROD N/A 01/16/2017    Procedure: ESOPHAGEAL 24 HOUR PH MONITORING;  Surgeon: Nurse-Based Giproc;  [facility]: [facility];  Service: Gastroenterology   ??? PR INS INTRVAS VC FILTR W/WO VAS ACS VSL SELXN RS&I Right 07/17/2021    Procedure: INFERIOR VENA CAVA FILTER PLACEMENT;  Surgeon: Ernie Hew, MD;  Location: [place] CATH;  Service: Cardiology   ??? PR REMOVAL DEEP IMPLANT Left 07/20/2021  Procedure: REMOVAL OF LEFT ANKLE HARDWARE;  Surgeon: Arva Chafe, MD;  Location: [facility] [place];  Service: Orthopedics   ??? PR UP GI ENDOSCOPY,BALL DIL,30MM N/A 10/28/2017    Procedure: UGI ENDO; W/BALLOON DILAT ESOPHAGUS (<30MM DIAM);  Surgeon: Liane Comber, MD;  [facility]: [facility];  Service: Gastroenterology   ??? PR UPPER GI ENDOSCOPY,BIOPSY Left 10/28/2017    Procedure: UGI ENDOSCOPY; WITH BIOPSY, SINGLE OR MULTIPLE;  Surgeon: Liane Comber, MD;  Location: [facility];  Service: Gastroenterology   ??? ROTATOR CUFF REPAIR Right 07/2016       Family History:  family history includes Heart disease in her father and mother; Hypertension in her father and mother; Kidney disease in her father and mother.    Social History:   reports that she has never smoked. She has never used smokeless tobacco. She reports that she does not drink alcohol and does not use drugs.    Medications:   Current Outpatient Medications   Medication Sig Dispense Refill   ??? acetaminophen (TYLENOL) 325 MG tablet Take 2 tablets (650 mg total) by mouth every six (6) hours as needed for pain. Start this when you get home and take as a scheduled medication through Sunday.  Then take as needed for pain as directed on the bottle. 100 tablet 2   ??? ALPRAZolam (XANAX) 1 MG tablet Take 1 mg by mouth every morning.     ??? amLODIPine (NORVASC) 5 MG tablet Take 2.5 mg by mouth every morning.     ??? apixaban (ELIQUIS) 2.5 mg Tab Take 1 tablet (2.5 mg total) by mouth Two (2) times a day.  0   ??? baclofen (LIORESAL) 10 MG tablet Take 10 mg by mouth nightly.     ??? cetirizine (ZYRTEC) 10 MG tablet Take 10 mg by mouth every morning.     ??? codeine-guaifenesin (GUAIFENESIN AC) 10-100 mg/5 mL liquid Take 5 mL by mouth Three (3) times a day as needed for cough. 118 mL 0   ??? fenofibrate (LOFIBRA) 160 MG tablet Take 160 mg by mouth every morning.     ??? fluticasone (FLONASE) 50 mcg/actuation nasal spray 1 spray into each nostril every morning.     ??? lamoTRIgine (LAMICTAL) 100 MG tablet Take 100 mg by mouth every morning.     ??? lisinopril (PRINIVIL,ZESTRIL) 20 MG tablet Take 20 mg by mouth every morning.     ??? mycophenolate (CELLCEPT) 500 mg tablet Take 1 tablet (500 mg total) by mouth Two (2) times a day. 60 tablet 11   ??? pantoprazole (PROTONIX) 40 MG tablet Take 40 mg by mouth every morning.     ??? PARoxetine (PAXIL) 30 MG tablet Take 30 mg by mouth every morning.     ??? pravastatin (PRAVACHOL) 40 MG tablet Take 40 mg by mouth every morning.     ??? predniSONE (DELTASONE) 2.5 MG tablet Take 1 tablet (2.5 mg total) by mouth daily. (Patient taking differently: Take 2.5 mg by mouth every morning.) 90 tablet 3   ??? promethazine (PHENERGAN) 25 MG tablet Take 25 mg by mouth every six (6) hours as needed for nausea.     ??? temAZEpam (RESTORIL) 30 mg capsule Take 30 mg by mouth nightly as needed for sleep.       No current facility-administered medications for this visit.       Allergies:  is allergic to trazodone and sulfa (sulfonamide antibiotics).    Imaging Reviewed: CT scan was reviewed  from 04/02/21 that showed multilevel degenerative changes, much worse at the L5-S1 level with bilateral foraminal narrowing at L5.  She had some left greater than right L4-5 lateral recess narrowing as well.  Mild arthritic changes in her hips bilaterally.   She also had x-rays done at her orthopedic clinic of her hips and lower back.  There is no fracture or listhesis in her lumbar spine.  It also showed degenerative changes L4-5 and L5-S1.  Her hip joint showed minimal arthritic changes.    Physical Exam: Positive straight leg raise on the right. Normal reflexes on the patellar and achilles tendons bilaterally. Normal strength in her gastrocnemius, anterior tibialis, quadricep and iliopsoas.  She is tender to palpation of her right trochanteric bursa.    Assessment and Plan: L5 radiculopathy with possible right-sided hip bursitis  Prescribed a medrol dose pack to help with pain  Lumbar MRI to determine treatment options, she is in agreement with this plan.    Joanne Gavel PA-C  [facility]  [address] [place], [place] [address]  Phone: [phone]  Fax: [phone]

## 2021-10-11 ENCOUNTER — Ambulatory Visit: Admit: 2021-10-11 | Discharge: 2021-10-12 | Payer: MEDICARE

## 2021-10-16 MED FILL — MYCOPHENOLATE MOFETIL 500 MG TABLET: ORAL | 30 days supply | Qty: 60 | Fill #5

## 2021-10-17 ENCOUNTER — Telehealth: Payer: Self-pay | Admitting: Psychiatry

## 2021-10-17 NOTE — Telephone Encounter (Signed)
See message from patient. 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 She said she had tried everything and nothing works for her. She had questions about Spravato that I was able to answer. She said she had found a clinic 1[place]2 miles from her house. She said her insurance didn't cover it. I don't know what, if any, assistance programs may be available. She wants to know if you still think this is a good option for her.

## 2021-10-17 NOTE — Telephone Encounter (Signed)
Next visit is 01/07/22. Stacey Alvarez called and she has moved to [place] and last time she saw Stacey Alvarez they talked about Spravato treatments for her and Stacey Alvarez said she found a clinic that does Spravato treatments in [place] and wanted to talk about this. Stacey Alvarez also got tearful on the phone and said she as not very forth coming on her last visit. Her phone number is [phone].

## 2021-10-18 NOTE — Telephone Encounter (Signed)
Please schedule apt with pt to discuss options for treatment resistant depression. Ok to put on wait list or offer apt on 2/7 at 8:30.

## 2021-10-19 NOTE — Telephone Encounter (Signed)
Left message to schedule

## 2021-10-30 ENCOUNTER — Ambulatory Visit: Admit: 2021-10-30 | Discharge: 2021-10-31 | Payer: MEDICARE

## 2021-11-06 NOTE — Unmapped (Signed)
[facility] Clinical Assessment & Refill Coordination Note    Amber Bush, DOB: September 17, 1955  Phone: There are no phone numbers on file.    All above HIPAA information was verified with patient.     Was a Nurse, learning disability used for this call? No    Specialty Medication(s):   Inflammatory Disorders: mycophenolate     Current Outpatient Medications   Medication Sig Dispense Refill   ??? acetaminophen (TYLENOL) 325 MG tablet Take 2 tablets (650 mg total) by mouth every six (6) hours as needed for pain. Start this when you get home and take as a scheduled medication through Sunday.  Then take as needed for pain as directed on the bottle. 100 tablet 2   ??? ALPRAZolam (XANAX) 1 MG tablet Take 1 mg by mouth every morning.     ??? amLODIPine (NORVASC) 5 MG tablet Take 2.5 mg by mouth every morning.     ??? apixaban (ELIQUIS) 2.5 mg Tab Take 1 tablet (2.5 mg total) by mouth Two (2) times a day.  0   ??? baclofen (LIORESAL) 10 MG tablet Take 10 mg by mouth nightly.     ??? cetirizine (ZYRTEC) 10 MG tablet Take 10 mg by mouth every morning.     ??? codeine-guaifenesin (GUAIFENESIN AC) 10-100 mg/5 mL liquid Take 5 mL by mouth Three (3) times a day as needed for cough. 118 mL 0   ??? fenofibrate (LOFIBRA) 160 MG tablet Take 160 mg by mouth every morning.     ??? fluticasone (FLONASE) 50 mcg/actuation nasal spray 1 spray into each nostril every morning.     ??? lamoTRIgine (LAMICTAL) 100 MG tablet Take 100 mg by mouth every morning.     ??? lisinopril (PRINIVIL,ZESTRIL) 20 MG tablet Take 20 mg by mouth every morning.     ??? methylPREDNISolone (MEDROL DOSEPACK) 4 mg tablet follow package directions 1 each 0   ??? mycophenolate (CELLCEPT) 500 mg tablet Take 1 tablet (500 mg total) by mouth Two (2) times a day. 60 tablet 11   ??? pantoprazole (PROTONIX) 40 MG tablet Take 40 mg by mouth every morning.     ??? PARoxetine (PAXIL) 30 MG tablet Take 30 mg by mouth every morning.     ??? pravastatin (PRAVACHOL) 40 MG tablet Take 40 mg by mouth every morning.     ??? predniSONE (DELTASONE) 2.5 MG tablet Take 1 tablet (2.5 mg total) by mouth daily. (Patient taking differently: Take 2.5 mg by mouth every morning.) 90 tablet 3   ??? promethazine (PHENERGAN) 25 MG tablet Take 25 mg by mouth every six (6) hours as needed for nausea.     ??? temAZEpam (RESTORIL) 30 mg capsule Take 30 mg by mouth nightly as needed for sleep.       No current facility-administered medications for this visit.        Changes to medications: Amber Bush reports no changes at this time.    Allergies   Allergen Reactions   ??? Trazodone Other (See Comments)     Paresthesias, weakness   ??? Sulfa (Sulfonamide Antibiotics) Rash       Changes to allergies: No    SPECIALTY MEDICATION ADHERENCE     Mycophenolate 500 mg: ~15 days of medicine on hand       Medication Adherence    Patient reported X missed doses in the last month: 0  Specialty Medication: Mycophenolate 500mg   Informant: patient  Specialty medication(s) dose(s) confirmed: Regimen is correct and unchanged.     Are there any concerns with adherence? No    Adherence counseling provided? Not needed    CLINICAL MANAGEMENT AND INTERVENTION      Clinical Benefit Assessment:    Do you feel the medicine is effective or helping your condition? Yes    Clinical Benefit counseling provided? Not needed    Adverse Effects Assessment:    Are you experiencing any side effects? No    Are you experiencing difficulty administering your medicine? No    Quality of Life Assessment:     How many days over the past month did your autoimmune disease  keep you from your normal activities? For example, brushing your teeth or getting up in the morning. Patient declined to answer    Have you discussed this with your provider? Not needed    Acute Infection Status:    Acute infections noted within Epic:  No active infections  Patient reported infection: None    Therapy Appropriateness:    Is therapy appropriate and patient progressing towards therapeutic goals? Yes, therapy is appropriate and should be continued    DISEASE/MEDICATION-SPECIFIC INFORMATION      N/A    PATIENT SPECIFIC NEEDS     - Does the patient have any physical, cognitive, or cultural barriers? No    - Is the patient high risk? Yes, patient is taking a REMS drug. Medication is dispensed in compliance with REMS program    - Does the patient require a Care Management Plan? No     SOCIAL DETERMINANTS OF HEALTH     At the [facility], we have learned that life circumstances - like trouble affording food, housing, utilities, or transportation can affect the health of many of our patients.   That is why we wanted to ask: are you currently experiencing any life circumstances that are negatively impacting your health and/or quality of life? Patient declined to answer    Social Determinants of Health     Food Insecurity: Not on file   Tobacco Use: Low Risk    ??? Smoking Tobacco Use: Never   ??? Smokeless Tobacco Use: Never   ??? Passive Exposure: Not on file   Transportation Needs: Not on file   Alcohol Use: Not on file   Housing/Utilities: Not on file   Substance Use: Not on file   Financial Resource Strain: Not on file   Physical Activity: Not on file   Health Literacy: Not on file   Stress: Not on file   Intimate Partner Violence: Not on file   Depression: Not on file   Social Connections: Not on file       Would you be willing to receive help with any of the needs that you have identified today? Not applicable       SHIPPING     Specialty Medication(s) to be Shipped:   Inflammatory Disorders: mycophenolate    Other medication(s) to be shipped: No additional medications requested for fill at this time     Changes to insurance: No    Delivery Scheduled: Yes, Expected medication delivery date: 11/16/21.     Medication will be delivered via Next Day Courier to the confirmed prescription address in [facility].    The patient will receive a drug information handout for each medication shipped and additional FDA Medication Guides as required.  Verified that patient has previously received a Conservation officer, historic buildings and a Surveyor, mining.  The patient or caregiver noted above participated in the development of this care plan and knows that they can request review of or adjustments to the care plan at any time.      All of the patient's questions and concerns have been addressed.    Camillo Flaming   [facility] Shared [facility] Specialty Pharmacist

## 2021-11-14 NOTE — Unmapped (Unsigned)
[facility] [facility] CONSULT VISIT       Patient Name: Amber Bush: 66 y.o. ([birth year])  Date of Encounter: 11/14/2021  Primary Care Provider:  Royce Macadamia, MD  Referring Provider: Ernie Hew    ASSESSMENT & PLAN:     Reason for visit: Elpidio Bush is a 66 y.o. female who presents in consultation at the request of Ernie Hew for preoperative evaluation as patient has history of DVT and PE .    {No diagnosis found. 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  Patient with history of bilateral PT and peroneal vein DVTs and bilateral submassive PE with right heart strain in 2018.  She was treated with Xarelto x 6 months.  No issues since.  - Ongoing issues wit SOB, ILD.  Left and right heart catheterization earlier this year demonstrated no significant CAD with mild pulmonary hypertension.  Follows with pulm  - Most recent CTA chest in July 2022 negative for PE  - Denies leg pain currently other than her orthopedic pain  - Discussed with Dr. Selena Batten.  Given her significant history of bilateral DVT and PE with right heart strain, recommend placement of IVC filter preoperatively in addition to 2.5 mg of Eliquis twice daily x4 weeks starting the day of her procedure.  We will try to get this scheduled prior to her orthopedic procedure next Friday however patient understands that this may not be possible.  We will coordinate with Dr. Lissa Hoard.    Left Ankle Pain  - Ortho notes reviewed. Hx prior left ankle fracture fracture from a fall.    - Scheduled for removal of hardware with Dr Lissa Hoard on 07/20/2021.  Patient will be in a boot  and able to bear weight in boot after her surgery.     Hypertension  - Chronic. Stable. BP below goal < 130/80  - home meds: Norvasc, lisinopril  - BP 133/62 in office today  - Recommend nonpharmacological interventions including weight loss, DASH diet, sodium reduction, increased physical activity, and reducing alcohol consumption (limit 2 for males and 1 for females)  -Follows with primary care      - Patient will return sooner or present to the ED if symptoms worsen or change.       HISTORY OF PRESENT ILLNESS:     CHIEF COMPLAINT: I am having surgery on my ankle     BRIEF HISTORY:     Amber Bush (Feb 13, 1956) is a 66 y.o. female with PMHx detailed below, who presents in consultation at the request of Ernie Hew for preoperative evaluation as patient has history of DVT and PE.    Patient with history of acute DVT involving the right and left posterior tibial veins and peroneal veins as well as bilateral submassive PE with right heart strain in 2018.  This was felt to be provoked by a sedentary lifestyle following rotator cuff surgery and estrogen replacement therapy.  No lytics.  Patient treated with Xarelto for 6 months.  She is scheduled to undergo hardware removal from the left ankle with Dr. Lissa Hoard next Friday, 07/20/2021.  She complains of some low back pain and has sciatica on the left.  Patient denies any symptoms of claudication.  Denies any calf pain.  She does not take aspirin. ***    No other complaints at this time. All other systems reviewed are negative.    Smoking Status: never  smoked.        CONSENT FOR CARE:     Risks, benefits, and alternatives were discussed with patient. Time was allotted for all questions and answers to patient's satisfaction and patient agrees to proceed with above procedure.    VASCULAR HISTORY:       07/17/2021 - IVC Filter Placement with Dr. Selena Batten       PMFSHx:     Past Medical History:   Diagnosis Date   ??? Anxiety    ??? Colon polyp    ??? Depression    ??? Diverticulitis of colon    ??? DVT (deep vein thrombosis) in pregnancy    ??? Fibrocystic breast    ??? GERD (gastroesophageal reflux disease)    ??? Hypertension    ??? ILD (interstitial lung disease) (CMS-HCC)    ??? Interstitial lung disease (CMS-HCC)    ??? OSA (obstructive sleep apnea)    ??? PE (pulmonary thromboembolism) (CMS-HCC)    ??? Sciatica    ??? Sleep apnea          Past Surgical History:   Procedure Laterality Date   ??? CHOLECYSTECTOMY     ??? HYSTERECTOMY      partial   ??? IC IVC FILTER PLACEMENT ([place] HISTORICAL RESULT)     ??? ORIF ANKLE FRACTURE Left    ??? PR CATH PLACE/CORON ANGIO, IMG SUPER/INTERP,R&L HRT CATH, L HRT VENTRIC N/A 04/02/2021    Procedure: Left/Right Heart Catheterization;  Surgeon: Neal Dy, MD;  Location: [facility] CATH;  Service: Cardiology   ??? PR ESOPHAGEAL MOTILITY STUDY, MANOMETRY N/A 01/16/2017    Procedure: ESOPHAGEAL MOTILITY STUDY W/INT & REP;  Surgeon: Nurse-Based Giproc;  [facility]: [facility];  Service: Gastroenterology   ??? PR GERD TST W/ NASAL IMPEDENCE ELECTROD N/A 01/16/2017    Procedure: ESOPH FUNCT TST NASL ELEC PLCMT;  Surgeon: Nurse-Based Giproc;  [facility]: [facility];  Service: Gastroenterology   ??? PR GERD TST W/ NASAL PH ELECTROD N/A 01/16/2017    Procedure: ESOPHAGEAL 24 HOUR PH MONITORING;  Surgeon: Nurse-Based Giproc;  [facility];  Service: Gastroenterology   ??? PR INS INTRVAS VC FILTR W/WO VAS ACS VSL SELXN RS&I Right 07/17/2021    Procedure: INFERIOR VENA CAVA FILTER PLACEMENT;  Surgeon: Ernie Hew, MD;  Location: [place] CATH;  Service: Cardiology   ??? PR REMOVAL DEEP IMPLANT Left 07/20/2021    Procedure: REMOVAL OF LEFT ANKLE HARDWARE;  Surgeon: Arva Chafe, MD;  Location: OR [place];  Service: Orthopedics   ??? PR UP GI ENDOSCOPY,BALL DIL,30MM N/A 10/28/2017    Procedure: UGI ENDO; W/BALLOON DILAT ESOPHAGUS (<30MM DIAM);  Surgeon: Liane Comber, MD;  Location: [facility];  Service: Gastroenterology   ??? PR UPPER GI ENDOSCOPY,BIOPSY Left 10/28/2017    Procedure: UGI ENDOSCOPY; WITH BIOPSY, SINGLE OR MULTIPLE;  Surgeon: Liane Comber, MD;  Location: [facility];  Service: Gastroenterology   ??? ROTATOR CUFF REPAIR Right 07/2016           Family History   Problem Relation Age of Onset   ??? Heart disease Mother    ??? Hypertension Mother    ??? Kidney disease Mother    ??? Heart disease Father    ??? Hypertension Father    ??? Kidney disease Father            Social History     Socioeconomic History   ??? Marital status: Widowed   Tobacco Use   ???  Smoking status: Never   ??? Smokeless tobacco: Never   Vaping Use   ??? Vaping Use: Never used   Substance and Sexual Activity   ??? Alcohol use: No     Alcohol/week: 0.0 standard drinks   ??? Drug use: No   ??? Sexual activity: Not Currently         Current Allergy List  Allergies   Allergen Reactions   ??? Trazodone Other (See Comments)     Paresthesias, weakness   ??? Sulfa (Sulfonamide Antibiotics) Rash       Current Medication List  Current Outpatient Medications   Medication Sig Dispense Refill   ??? acetaminophen (TYLENOL) 325 MG tablet Take 2 tablets (650 mg total) by mouth every six (6) hours as needed for pain. Start this when you get home and take as a scheduled medication through Sunday.  Then take as needed for pain as directed on the bottle. 100 tablet 2   ??? ALPRAZolam (XANAX) 1 MG tablet Take 1 mg by mouth every morning.     ??? amLODIPine (NORVASC) 5 MG tablet Take 2.5 mg by mouth every morning.     ??? apixaban (ELIQUIS) 2.5 mg Tab Take 1 tablet (2.5 mg total) by mouth Two (2) times a day.  0   ??? baclofen (LIORESAL) 10 MG tablet Take 10 mg by mouth nightly.     ??? cetirizine (ZYRTEC) 10 MG tablet Take 10 mg by mouth every morning.     ??? codeine-guaifenesin (GUAIFENESIN AC) 10-100 mg/5 mL liquid Take 5 mL by mouth Three (3) times a day as needed for cough. 118 mL 0   ??? fenofibrate (LOFIBRA) 160 MG tablet Take 160 mg by mouth every morning.     ??? fluticasone (FLONASE) 50 mcg/actuation nasal spray 1 spray into each nostril every morning.     ??? lamoTRIgine (LAMICTAL) 100 MG tablet Take 100 mg by mouth every morning.     ??? lisinopril (PRINIVIL,ZESTRIL) 20 MG tablet Take 20 mg by mouth every morning.     ??? methylPREDNISolone (MEDROL DOSEPACK) 4 mg tablet follow package directions 1 each 0   ??? mycophenolate (CELLCEPT) 500 mg tablet Take 1 tablet (500 mg total) by mouth Two (2) times a day. 60 tablet 11   ??? pantoprazole (PROTONIX) 40 MG tablet Take 40 mg by mouth every morning.     ??? PARoxetine (PAXIL) 30 MG tablet Take 30 mg by mouth every morning.     ??? pravastatin (PRAVACHOL) 40 MG tablet Take 40 mg by mouth every morning.     ??? predniSONE (DELTASONE) 2.5 MG tablet Take 1 tablet (2.5 mg total) by mouth daily. (Patient taking differently: Take 2.5 mg by mouth every morning.) 90 tablet 3   ??? promethazine (PHENERGAN) 25 MG tablet Take 25 mg by mouth every six (6) hours as needed for nausea.     ??? temAZEpam (RESTORIL) 30 mg capsule Take 30 mg by mouth nightly as needed for sleep.       No current facility-administered medications for this visit.       CLINICAL EXAM:     There were no vitals filed for this visit.  There is no height or weight on file to calculate BMI.      Physical Exam      Constitutional - No acute distress, well appearing and well nourished  Eyes: Lids are normal without ptosis, edema, ectropion or entropion. Conjunctivae are normal and without inflammation, injection, hemorrhages or exudates.  Ear/Nose/Mouth/Throat -  No drainage from the ears, oral and nasal mucosa moist. Tongue is normal and without lesions.  Head and Face - Face is symmetrical, no active lesions, or abrasions  Neck - Supple, symmetric. Trachea midline.  No masses. No JVD. No carotid bruits.   Cardiovascular - RRR. + 2/6 murmur. No rubs, or gallops. PMI not displaced. No thrills, lifts, or heaves.  Respiratory -  No increased work of breathing or signs of respiratory distress. Symmetrical lung expansion. Clear to auscultation.  Musculoskeletal - Muscle strength and tone normal. No atrophy or abnormal movements noted.  Patient walks with a cane  Extremities - Extremities are warm bilaterally. No wounds.  Radials palpable and equal bilaterally.  Skin - no rashes  Neurologic - CN II-XII grossly intact, no focal deficits noted  Psychiatric - Oriented to person, place, and time.  Mood and affect is appropriate.      ANCILLARY DATA:     NA         Yehuda Budd, FNP-C  [facility]  [address]  [place], [place] [address]  [phone] (office)  [phone] (fax)      This note was generated utilizing Dragon autodictation software.  Attempt has been made to correct errors but residual errors may remain and do not reflect the care provided.

## 2021-11-15 MED FILL — MYCOPHENOLATE MOFETIL 500 MG TABLET: ORAL | 30 days supply | Qty: 60 | Fill #6

## 2021-11-23 ENCOUNTER — Ambulatory Visit: Admit: 2021-11-23 | Discharge: 2021-11-24 | Payer: MEDICARE

## 2021-11-26 DIAGNOSIS — M545 Chronic right-sided low back pain, unspecified whether sciatica present: Principal | ICD-10-CM

## 2021-11-26 DIAGNOSIS — G8929 Other chronic pain: Principal | ICD-10-CM

## 2021-11-26 DIAGNOSIS — M47816 Spondylosis without myelopathy or radiculopathy, lumbar region: Principal | ICD-10-CM

## 2021-11-26 NOTE — Unmapped (Signed)
I reviewed Amber Bush's MRI with her via the telephone.  She does have some moderate lateral recess narrowing at L5-S1.  I do not see a great deal of L5 compression.  She has primarily pain in the buttock/hip area she states.  It intermittently radiates down the posterior thigh but not to the calf.  I do not think surgical intervention at this time is indicated.  I will ask her to see the [facility] pain management team to discuss other options including injections.  She is in agreement.    Joanne Gavel, PA-C

## 2021-12-05 NOTE — Unmapped (Signed)
[facility] Refill Coordination Note    Specialty Medication(s) to be Shipped:   General Specialty: mycophenolate 500mg     Other medication(s) to be shipped: No additional medications requested for fill at this time     Amber Bush, DOB: December 23, 1955  Phone: There are no phone numbers on file.      All above HIPAA information was verified with patient.     Was a Nurse, learning disability used for this call? No    Completed refill call assessment today to schedule patient's medication shipment from the [facility] [phone]).  All relevant notes have been reviewed.     Specialty medication(s) and dose(s) confirmed: Regimen is correct and unchanged.   Changes to medications: Amber Bush reports no changes at this time.  Changes to insurance: No  New side effects reported not previously addressed with a pharmacist or physician: None reported  Questions for the pharmacist: No    Confirmed patient received a Conservation officer, historic buildings and a Surveyor, mining with first shipment. The patient will receive a drug information handout for each medication shipped and additional FDA Medication Guides as required.       DISEASE/MEDICATION-SPECIFIC INFORMATION        N/A    SPECIALTY MEDICATION ADHERENCE     Medication Adherence    Patient reported X missed doses in the last month: 0  Specialty Medication: Mycophenolate 500mg   Patient is on additional specialty medications: No  Patient is on more than two specialty medications: No              Were doses missed due to medication being on hold? No    Mycophenolate 500 mg: 7-10 days of medicine on hand       REFERRAL TO PHARMACIST     Referral to the pharmacist: Not needed      [facility]     Shipping address confirmed in Epic.     Delivery Scheduled: Yes, Expected medication delivery date: 12/12/21.     Medication will be delivered via Next Day Courier to the prescription address in Epic WAM.    Amber Bush [facility] Specialty Technician

## 2021-12-11 ENCOUNTER — Ambulatory Visit: Admit: 2021-12-11 | Discharge: 2021-12-12 | Payer: MEDICARE

## 2021-12-11 DIAGNOSIS — M25552 Pain in left hip: Principal | ICD-10-CM

## 2021-12-11 DIAGNOSIS — M25559 Pain in unspecified hip: Principal | ICD-10-CM

## 2021-12-11 DIAGNOSIS — M545 Chronic right-sided low back pain, unspecified whether sciatica present: Principal | ICD-10-CM

## 2021-12-11 DIAGNOSIS — M25551 Pain in right hip: Principal | ICD-10-CM

## 2021-12-11 DIAGNOSIS — G8929 Other chronic pain: Principal | ICD-10-CM

## 2021-12-11 DIAGNOSIS — M7062 Trochanteric bursitis, left hip: Principal | ICD-10-CM

## 2021-12-11 DIAGNOSIS — Z6841 Body Mass Index (BMI) 40.0 and over, adult: Principal | ICD-10-CM

## 2021-12-11 DIAGNOSIS — M47816 Spondylosis without myelopathy or radiculopathy, lumbar region: Principal | ICD-10-CM

## 2021-12-11 DIAGNOSIS — M7061 Trochanteric bursitis, right hip: Principal | ICD-10-CM

## 2021-12-11 MED ORDER — DICLOFENAC SODIUM 75 MG TABLET,DELAYED RELEASE
ORAL_TABLET | Freq: Two times a day (BID) | ORAL | 1 refills | 30 days | Status: CP
Start: 2021-12-11 — End: 2022-01-10

## 2021-12-11 NOTE — Unmapped (Signed)
Date of Visit: 12/11/2021     CHIEF COMPLAINT: Low back pain, hips, left lateral thigh    PAIN DESCRIPTION: This is a 66 y.o. year old female with pain in the aforementioned areas here for a new patient consultation. The pain is currently rated as a 8/10. It has gone on for >1 year and is described as occurring continuously. Overall, it has increased. It is described as throbbing, shooting, sharp, and stabbing. Factors that make the pain better include: lying down. Factors that make the pain worse include: fatigue and coughing. Activities affected by the pain include falling asleep and performing household chores.        ---  Referring MD: Marlane Hatcher, PA  PCP: Royce Macadamia, MD  Date of Initial consult: 12/11/2021    PAIN HISTORY: (HC/  ) The patient was referred for evaluation and treatment of low back and right leg pain. [facility] evaluated her, looked at CT, ordered MRI and noted moderate lateral recess narrowing at L5-S1. Recommended she see [place] for possible injections. She has pain more on the right than left.     PMH: Interstitial lung dz (on prednisone chronically for that)  SH: Retired, lives alone; daughter lives in [place]  CPS Plan: Right hip injection, NSAID, check hip xrays, maybe aquatic therapy, maybe trochanteric bursa injection on right or MBB 3-4 and L5-S1 bilaterally (did not offer LESI, because right thigh pain seems more related to hip)    PAST PAIN RELEVANT SURGERIES:  07/20/21 Removal of plate and screws left distal fibula (Dr.Boone)  07/17/21 IVC filterplacement  2019 Left ankle surgery    PAST PAIN TREATMENTS TRIED:  [facility] saw her 10/10/21  Home PT- didn't help her pain and didn't help her lose weight  TENS unit  In [place], [facility] used to do chronic injections in her hips, but never completely went away  Does NOT have a hip doctor    PAST INJECTIONS DONE: (not on eliquis anymore)  none tried    PERTINENT RADIOGRAPHIC STUDIES:  12/11/21 Ordered bilateral hip films    11/23/21 MRI L-spine ([facility])-   L1-2 Mild disc osteophyte complex.  L2-3:Mild disc bulge.  L3-4: Mild disc bulge and facet arthropathy.  L4-5: Disc osteophyte complex with mild narrowing of the left subarticular recess.  Superimposed small left lateral disc extrusion abuts the extraforaminal left L4 nerve root.  Moderate left neural foraminal stenosis.  L5-S1: Disc osteophyte complex and facet degeneration mildly narrows the.  Mild bilateral neural foraminal stenosis    PAST MEDICATIONS TRIED AND EFFECT:  Class Med Tried-Response   NSAIDs Diclofenac 75 bid prn trial 12/06/21   Weak mu agonist Tramadol- suppressed her breathing   Anti-epileptic    Tricicylics, Other antidepr.    Muscle relaxant Baclofen- takes the edge off   Miscellaneous Medrol dosepak- no help  Prednisone chronically for interstitial lung dz   Opioids    The following measures were taken to ensure proper prescribing of opioid medications    - Opioid agreement was signed on:  N/A - Patient is NOT under a current pain contract with [facility]. We do NOT manage this patient's opioid medications.      - Baseline PEG (12/11/2021) IF off opioids:     - Last Psych screening test done:       - [place] Controlled Substance Reporting System Notable findings:    - Last urine drug screen:       2023 ANNUAL MIPS REPORTING MEASURES (Last done: 12/11/2021)      #  Description Screening Result Plan   47 Advance Care Plan (ACP) % of patients 39 yo or older who have an advance care plan or surrogate decision maker documented in the med record or documentation an ACP was discussed - ACP discussed and documented; advance care plan or surrogate decision maker documented in the medical record (Performance Met 1123F)    Advance Directives: Received 06/25/2018     128 Body Mass Index Body mass index is 41.37 kg/m??. The patient is overweight (BMI >25) and is advised to and adopt a healthy lifestyle that includes the following: 20 minutes of aerobic exercise three times a week, eating at least 3 square meals a day (and not skipping breakfast), stopping eating more than 2 hours prior to bedtime, and avoiding snacking during the day. [phone])    226   Tobacco Use: Screening and Cessation Intervention Patient use any type of tobacco? [facility] Tobacco MIPS: - Patient was screened for tobacco use and identified as a tobacco non-user.     318 Falls Risk % of patients 82 yo or older who were screened for future fall risk during the measurement period - The patient is 95 or older and was last screened for falls risk today.        NO SHOWS AND CANCELLATIONS: None on file    ---  ALLERGIES:   Allergies   Allergen Reactions    Trazodone Other (See Comments)     Paresthesias, weakness    Sulfa (Sulfonamide Antibiotics) Rash       MEDICATIONS:   Current Outpatient Medications on File Prior to Visit   Medication Sig Dispense Refill    acetaminophen (TYLENOL) 325 MG tablet Take 2 tablets (650 mg total) by mouth every six (6) hours as needed for pain. Start this when you get home and take as a scheduled medication through Sunday.  Then take as needed for pain as directed on the bottle. 100 tablet 2    ALPRAZolam (XANAX) 1 MG tablet Take 1 tablet (1 mg total) by mouth every morning.      amLODIPine (NORVASC) 5 MG tablet Take 0.5 tablets (2.5 mg total) by mouth every morning.      baclofen (LIORESAL) 10 MG tablet Take 1 tablet (10 mg total) by mouth nightly.      cetirizine (ZYRTEC) 10 MG tablet Take 1 tablet (10 mg total) by mouth every morning.      fenofibrate micronized (LOFIBRA) 134 MG capsule Take 1 capsule (134 mg total) by mouth daily before breakfast.      fluticasone (FLONASE) 50 mcg/actuation nasal spray 1 spray into each nostril every morning.      lamoTRIgine (LAMICTAL) 100 MG tablet Take 1 tablet (100 mg total) by mouth every morning.      lisinopril (PRINIVIL,ZESTRIL) 20 MG tablet Take 1 tablet (20 mg total) by mouth every morning.      mycophenolate (CELLCEPT) 500 mg tablet Take 1 tablet (500 mg total) by mouth Two (2) times a day. 60 tablet 11    pantoprazole (PROTONIX) 40 MG tablet Take 1 tablet (40 mg total) by mouth every morning.      PARoxetine (PAXIL) 30 MG tablet Take 1 tablet (30 mg total) by mouth every morning.      pravastatin (PRAVACHOL) 40 MG tablet Take 1 tablet (40 mg total) by mouth every morning.      predniSONE (DELTASONE) 2.5 MG tablet Take 1 tablet (2.5 mg total) by mouth daily. (Patient taking differently: Take 1 tablet (2.5  mg total) by mouth every morning.) 90 tablet 3    promethazine (PHENERGAN) 25 MG tablet Take 1 tablet (25 mg total) by mouth every six (6) hours as needed for nausea.      temAZEpam (RESTORIL) 30 mg capsule Take 1 capsule (30 mg total) by mouth nightly as needed for sleep.       No current facility-administered medications on file prior to visit.       PAST MEDICAL HISTORY:   Past Medical History:   Diagnosis Date    Anxiety     Colon polyp     Depression     Diverticulitis of colon     DVT (deep vein thrombosis) in pregnancy     Fibrocystic breast     GERD (gastroesophageal reflux disease)     Hypertension     ILD (interstitial lung disease) (CMS-HCC)     Interstitial lung disease (CMS-HCC)     OSA (obstructive sleep apnea)     PE (pulmonary thromboembolism) (CMS-HCC)     Sciatica     Sleep apnea        PAST SURGICAL HISTORY:   Past Surgical History:   Procedure Laterality Date    CHOLECYSTECTOMY      HYSTERECTOMY      partial    IC IVC FILTER PLACEMENT ([facility] HISTORICAL RESULT)      ORIF ANKLE FRACTURE Left     PR CATH PLACE/CORON ANGIO, IMG SUPER/INTERP,R&L HRT CATH, L HRT VENTRIC N/A 04/02/2021    Procedure: Left/Right Heart Catheterization;  Surgeon: Neal Dy, MD;  Location[facility] CATH;  Service: Cardiology    PR ESOPHAGEAL MOTILITY STUDY, MANOMETRY N/A 01/16/2017    Procedure: ESOPHAGEAL MOTILITY STUDY W/INT & REP;  Surgeon: Nurse-Based Giproc;  [facility];  Service: Gastroenterology    PR GERD TST W/ NASAL IMPEDENCE ELECTROD N/A 01/16/2017    Procedure: ESOPH FUNCT TST NASL ELEC PLCMT;  Surgeon: Nurse-Based Giproc;  Location: [facility];  Service: Gastroenterology    PR GERD TST W/ NASAL PH ELECTROD N/A 01/16/2017    Procedure: ESOPHAGEAL 24 HOUR PH MONITORING;  Surgeon: Nurse-Based Giproc;  [facility];  Service: Gastroenterology    PR INS INTRVAS VC FILTR W/WO VAS ACS VSL SELXN RS&I Right 07/17/2021    Procedure: INFERIOR VENA CAVA FILTER PLACEMENT;  Surgeon: Ernie Hew, MD;  Location: [facility] CATH;  Service: Cardiology    PR REMOVAL DEEP IMPLANT Left 07/20/2021    Procedure: REMOVAL OF LEFT ANKLE HARDWARE;  Surgeon: Arva Chafe, MD;  Location: OR [facility];  Service: Orthopedics    PR UP GI ENDOSCOPY,BALL DIL,30MM N/A 10/28/2017    Procedure: UGI ENDO; W/BALLOON DILAT ESOPHAGUS (<30MM DIAM);  Surgeon: Liane Comber, MD;  Location: [facility] PROCEDURES [facility];  Service: Gastroenterology    PR UPPER GI ENDOSCOPY,BIOPSY Left 10/28/2017    Procedure: UGI ENDOSCOPY; WITH BIOPSY, SINGLE OR MULTIPLE;  Surgeon: Liane Comber, MD;  Location: [facility];  Service: Gastroenterology    ROTATOR CUFF REPAIR Right 07/2016       FAMILY MEDICAL HISTORY:   Family History   Problem Relation Age of Onset    Heart disease Mother     Hypertension Mother     Kidney disease Mother     Heart disease Father     Hypertension Father     Kidney disease Father        SOCIAL HISTORY:   Social History  Socioeconomic History    Marital status: Widowed     Spouse name: None    Number of children: None    Years of education: None    Highest education level: None   Tobacco Use    Smoking status: Never    Smokeless tobacco: Never   Vaping Use    Vaping Use: Never used   Substance and Sexual Activity    Alcohol use: No     Alcohol/week: 0.0 standard drinks    Drug use: No    Sexual activity: Not Currently       REVIEW OF SYSTEMS:  Constitutional: Negative  Musculoskeletal:weakness  Neurological: dizziness  Psychiatric:anxiety  Cardiovascular: denies chest pain, palpitations  Pulmonary: shortness of breath  GI: denies abdominal pain, bloating, constipation, diarrhea, heartburn, or nausea.  GU: denies decreased libido, urinary frequency, urinary hesitancy  Integumentary: denies rash, swelling, skin thinning, pruritis, nail changes, hair growth changes, or skin discoloration  Hematologic/Lymphatic: denies easy bruising, bleeding  Head/Ears/Eyes/Nose/Throat: denies blurry vision, tinnitus, dry mouth  Rheumatology: Negative    PHYSICAL EXAM:  VITALS:        12/11/21 1218   BP: 115/56   Pulse: 94   Resp: 16   Weight: (!) 109.3 kg (241 lb)   Height: 162.6 cm (5' 4)   PainSc: 8    PainLoc: Generalized     GENERAL: This patient is well developed, well nourished and alert and oriented times three with intact speech, pattern, content, and tone. Language, attention, cognition and memory were intact.   HEENT: Reveals normocephalic/atraumatic. Cranial nerves II through XII were grossly intact.  CARDIOVASCULAR: Regular S1, S2. Peripheral pulses were palpable at the radial artery and dorsalis pedis.  PULMONARY: Respirations are unlabored without evidence of stridor, wheezing. Good air exchange  ABDOMEN: Positive bowel sounds.  The patient???s abdomen is soft, non-tender, non-distended.  No masses palpable.  MUSCULOSKELETAL: The motor exam reveals the RIGHT lower extremity to be 5/5 with hip flexion, 5/5 knee flexion and extention, 5/5 at the dorsi and plantar flexion of the ankle.  The motor exam reveals the LEFT lower extremity to be 5/5 with hip flexion, 5/5 knee flexion and extension, 5/5 with dorsi and plantar flexion of the ankle. Straight leg raise was Negative bilaterally. Gait appeared to be antalgic and walks with the aid of a cane  NEURO: The sensory exam revealed no deficits in the lower extremities. Reflexes reveals on the RIGHT lower extremity were 2+ at the patella and 1+ at the Achilles. Reflexes on the LEFT lower extremity were 2+ at the patella, 1+ at the Achilles.  SPINE: Palpation of the low back musculature did elicit pain. There was decreased flexion and extension at the waist did cause any pain. Positive pain on facet loading procedures. Extension and flexion of the back is limited. Palpation over the SIJoints was negative on the right and negative on the left.  JOINTS: With maneuvers of the RIGHT hip there was pain. With manuvers of the LEFT hip there was pain. There was also pain with palpation over bilateral greater trochanters. Crepitus was not felt in bilateral knee.  INTEGUMENTARY: Upper and lower extremities did not appear to have any evidence swelling, edema, rashes, or infection.    ASSESSMENT:     ICD-10-CM   1. Hip pain  M25.559   2. Lumbar spondylosis  M47.816   3. Chronic right-sided low back pain, unspecified whether sciatica present  M54.50    G89.29   4. Morbid obesity with BMI  of 40.0-44.9, adult (CMS-HCC)  E66.01    Z68.41   5. Greater trochanteric bursitis of both hips  M70.61    M70.62   6. Bilateral hip pain  M25.551    M25.552       PLAN:   At this visit, the following medications were prescribed with appropriate education:  Requested Prescriptions     Signed Prescriptions Disp Refills    diclofenac (VOLTAREN) 75 MG EC tablet 60 tablet 1     Sig: Take 1 tablet (75 mg total) by mouth Two (2) times a day.       Additional orders given at today's visit:  Orders Placed This Encounter   Procedures    XR Hip/Pelvis 2 Views Bilateral     - It sounds like her right lateral thigh pain is somewhat related to her hip pain. 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 I'll schedule a right hip injection    - I offered to send her to aquatic PT to work on her weight, but she is reluctant at this time.    - I offered her to start her on an NSAID and she was receptive to diclofenac 75 bid prn for her hip arthritis    - I also think that she has greater trochanteric bursitis bilaterally and that might improve with NSAIDs as well    - We can consider L-MBB for her in the future if her back is more bothersome.  - I explained that I don't think a LESI is needed right now, and that the hip injection may give her more relief.     Laurell Roof, MD   Electronically signed 12/11/2021  12:50 PM

## 2021-12-12 NOTE — Unmapped (Signed)
Amber [facility] 's Mycophenolate shipment will be canceled  as a result of Courier returned package      I have reached out to the patient  at [phone] and left a voicemail message.  We will not reschedule the medication and have removed this/these medication(s) from the work request.  We have canceled this work request.

## 2021-12-14 NOTE — Unmapped (Signed)
Amber Bush 's Mycophenolate shipment will be sent out  as a result of pt was not home      Patient called back and communicated the delivery change. We will reschedule the medication for the delivery date that the patient agreed upon.  We have confirmed the delivery date as 04/04, via ups.

## 2021-12-17 MED FILL — MYCOPHENOLATE MOFETIL 500 MG TABLET: ORAL | 30 days supply | Qty: 60 | Fill #7

## 2022-01-07 ENCOUNTER — Encounter: Payer: Self-pay | Admitting: Psychiatry

## 2022-01-07 ENCOUNTER — Telehealth (INDEPENDENT_AMBULATORY_CARE_PROVIDER_SITE_OTHER): Payer: Medicare Other | Admitting: Psychiatry

## 2022-01-07 DIAGNOSIS — F411 Generalized anxiety disorder: Secondary | ICD-10-CM | POA: Diagnosis not present

## 2022-01-07 DIAGNOSIS — F5101 Primary insomnia: Secondary | ICD-10-CM | POA: Diagnosis not present

## 2022-01-07 DIAGNOSIS — F332 Major depressive disorder, recurrent severe without psychotic features: Secondary | ICD-10-CM

## 2022-01-07 MED ORDER — AUVELITY 45-105 MG PO TBCR: EXTENDED_RELEASE_TABLET | ORAL | 1 refills | Status: DC

## 2022-01-07 NOTE — Progress Notes (Signed)
Catlyn Shipton ?[id number] ?08/13/1956 ?66 y.o. ? ?Virtual Visit via Video Note ? ?I connected with pt @ on 01/07/22 at 12:45 PM EDT by a video enabled telemedicine application and verified that I am speaking with the correct person using two identifiers. ?  ?I discussed the limitations of evaluation and management by telemedicine and the availability of in person appointments. The patient expressed understanding and agreed to proceed. ? ?I discussed the assessment and treatment plan with the patient. The patient was provided an opportunity to ask questions and all were answered. The patient agreed with the plan and demonstrated an understanding of the instructions. ?  ?The patient was advised to call back or seek an in-person evaluation if the symptoms worsen or if the condition fails to improve as anticipated. ? ?I provided 30 minutes of non-face-to-face time during this encounter.  The patient was located at home.  The provider was located at [place]. ? ? ?Thayer Headings, PMHNP  ? ?Subjective:  ? ?Patient ID:  Stacey Alvarez is a 66 y.o. (DOB 23-May-1956) female. ? ?Chief Complaint:  ?Chief Complaint  ?Patient presents with  ? Depression  ? Anxiety  ? ? ?HPI ?Brinnley Lacap presents for follow-up of depression, anxiety, and insomnia. She reports that she has been having some health issues. She had hardwire removed out of her left ankle. She has right hip pain. She has injections scheduled for Friday and has been falling and having difficulty walking.  ? ?She reports that she has been isolating and sleeping all the time. She reports "no ambition to go out." She reports sad mood. She reports that she has boxes that still need to be unpacked and things needing to be organized. She reports low energy and motivation. Diminished interest and enjoyment in things. She enjoys time with her daughter. Anxiety has been "awful... a know in my stomach all the time. A feeling that something bad is coming." She reports  that she worries "all the time." Denies panic. She reports diminished appetite. She reports that she has not been drinking enough fluids. Denies SI.  ? ?She sees daughter about twice a week. Daughter seems to be much happier. Husband's birthday would have been in March and this is also the anniversary of son's death.  ? ?She looked into [place]rav[place] at a clinic near her and they do not accept her insurance.  ? ?She is seeing Beckey Downing, LCAS for therapy.  ? ?Past Psychiatric Medication Trials: ?(Reports sensitivity to medication) ?Lamictal- reports that 150 mg felt that this was too high. ?Lithium- Ineffective ?Paxil- Took years ago in combination with other medications ?Lexapro-ineffective ?Zoloft- ineffective ?Prozac- Has tremor and unsteadiness on 80 mg. Has taken about 5 years. Unsure of benefit. ?Effexor XR- adverse reaction. Confusion. ?Cymbalta- Ineffective.  ?Remeron- Wt gain. ?Trintellix- GI side effects. Some partial improvement., Itching on palms ?Wellbutrin- rash during clinical trials.  ?Gabapentin- felt unsteady. Suppressed breathing.  ?Lyrica-Suppressed breathing. ?Temazepam ?Xanax ?Abilify-Ineffective.  ?Risperdal- wt gain ?Rexulti- Akathisia  ?Latuda        ?Viibryd- Reports that Dr. Toy Care has told her that she was on it and she does not recall this.  ?Propranolol- felt "revved up."  ?Trazodone- Adverse effects. ?Ritalin- shortness of breath ? ?Review of Systems:  ?Review of Systems  ?Musculoskeletal:  Negative for gait problem.  ?     Hip pain  ?Neurological:  Negative for tremors.  ?Psychiatric/Behavioral:    ?     Please refer to HPI  ? ?Medications: I have  reviewed the patient's current medications. ? ?Current Outpatient Medications  ?Medication Sig Dispense Refill  ? ALPRAZolam (XANAX) 1 MG tablet TAKE ONE TABLET BY MOUTH FOUR TIMES A DAY AS NEEDED FOR ANXIETY 360 tablet 0  ? amLODipine (NORVASC) 5 MG tablet TAKE 1/2 TABLET BY MOUTH IN THE MORNING AND 1 TABLET AT BEDTIME (Patient taking differently:  5 mg daily.) 135 tablet 3  ? butalbital-aspirin-caffeine-codeine (FIORINAL WITH CODEINE) 50-325-40-30 MG capsule Take 1 capsule by mouth every 6 (six) hours as needed for pain. 21 capsule 0  ? cetirizine (ZYRTEC) 10 MG tablet Take 10 mg by mouth daily.    ? Dextromethorphan-buPROPion ER (AUVELITY) 45-105 MG TBCR Take 1 tablet for 3 days, then 1 tablet twice daily 60 tablet 1  ? fenofibrate 160 MG tablet Take 1 tablet (160 mg total) by mouth daily. 30 tablet 0  ? fluticasone (FLONASE) 50 MCG/ACT nasal spray Place 1 spray into both nostrils daily.    ? lamoTRIgine (LAMICTAL) 100 MG tablet Take 1 tablet (100 mg total) by mouth daily. 90 tablet 1  ? lisinopril (ZESTRIL) 20 MG tablet Take 1 tablet (20 mg total) by mouth daily. 90 tablet 0  ? mycophenolate (CELLCEPT) 500 MG tablet Take 500 mg by mouth 2 (two) times daily.     ? pantoprazole (PROTONIX) 40 MG tablet Take 1 tablet (40 mg total) by mouth 2 (two) times daily. (Patient taking differently: Take 40 mg by mouth daily.) 120 tablet 0  ? pravastatin (PRAVACHOL) 40 MG tablet Take 1 tablet (40 mg total) by mouth daily. Need appointment 90 tablet 0  ? predniSONE (DELTASONE) 2.5 MG tablet Take 2.5 mg by mouth daily.    ? promethazine (PHENERGAN) 25 MG tablet TAKE ONE TABLET BY MOUTH EVERY 8 HOURS AS NEEDED FOR FOR NAUSEA AND VOMITING 30 tablet 0  ? temazepam (RESTORIL) 30 MG capsule Take 1 capsule (30 mg total) by mouth at bedtime. 90 capsule 0  ? OXYGEN Inhale 2 L into the lungs as needed (SOB).    ? PARoxetine (PAXIL) 30 MG tablet Take 1 tablet (30 mg total) by mouth daily. 90 tablet 0  ? pregabalin (LYRICA) 75 MG capsule Take 1 capsule (75 mg total) by mouth 2 (two) times daily. (Patient not taking: Reported on 11/30/2020) 60 capsule 1  ? SYRINGE-NEEDLE, DISP, 3 ML (B-D 3CC LUER-LOK SYR 25GX1") 25G X 1" 3 ML MISC USE TO ADMINSTER B12 INJECTIONS 1 TIME A WEEK FOR 4 WEEKS THEN ONCE MONTHLY FOR 5 MONTH 10 each 0  ? ?No current facility-administered medications for this  visit.  ? ? ?Medication Side Effects: None ? ?Allergies:  ?Allergies  ?Allergen Reactions  ? Lisinopril   ?  COUGH   ? Trazodone And Nefazodone   ?  Paresthesias, weakness  ? Trintellix [Vortioxetine]   ? Macrobid [Nitrofurantoin Monohyd Macro] Diarrhea  ?  Diarrhea, GI upset  ? Sulfa Antibiotics Rash  ? ? ?Past Medical History:  ?Diagnosis Date  ? Allergy   ? Anxiety   ? Arthritis   ? KNEES,BACK,HIPS  ? Depression   ? Fibromyalgia   ? GERD (gastroesophageal reflux disease)   ? Hyperlipidemia   ? Hypertension   ? IBS (irritable bowel syndrome)   ? Interstitial lung disease ([place])   ? Pulmonary embolism ([place]) 09/2016  ? provoked- s/p rotator cuff repair  ? Sleep apnea   ? ? ?Family History  ?Problem Relation Age of Onset  ? Diabetes Mother   ? Heart disease Mother   ?  Anxiety disorder Mother   ? Depression Mother   ? Kidney disease Mother   ? Diabetes Father   ? Heart disease Father   ? Anxiety disorder Father   ? Depression Father   ? Diabetes Paternal Grandmother   ? Cystic fibrosis Son   ? Alcohol abuse Son   ? Drug abuse Son   ? Anxiety disorder Son   ? Depression Son   ? Breast cancer Maternal Aunt   ? Breast cancer Maternal Aunt   ? ? ?Social History  ? ?Socioeconomic History  ? Marital status: Widowed  ?  Spouse name: Not on file  ? Number of children: 2  ? Years of education: 53  ? Highest education level: High school graduate  ?Occupational History  ? 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 Smoking status: Former  ? Smokeless tobacco: Never  ? Tobacco comments:  ?  smoked 2 years in late teens about 8-10 cigs daily.   ?Vaping Use  ? Vaping Use: Never used  ?Substance and Sexual Activity  ? Alcohol use: No  ?  Alcohol/week: 0.0 standard drinks  ? Drug use: No  ? Sexual activity: Not on file  ?Other Topics Concern  ? Not on file  ?Social History Narrative  ? Lives alone, widowed. Has one dtr. in [place]. [place] and one son in [place]  ? ?Social Determinants of Health  ? ?Financial  Resource Strain: Not on file  ?Food Insecurity: Not on file  ?Transportation Needs: Not on file  ?Physical Activity: Not on file  ?Stress: Not on file  ?Social Connections: Not on file  ?Intimate Partner

## 2022-01-10 ENCOUNTER — Telehealth: Payer: Self-pay | Admitting: Psychiatry

## 2022-01-10 NOTE — Telephone Encounter (Signed)
Pt called report the [place] is too expensive $100-200. Has medicare insurance. Traci to get back with pt about this issue. Pt # [phone]  ?

## 2022-01-14 NOTE — Unmapped (Signed)
[facility] Refill Coordination Note    Specialty Medication(s) to be Shipped:   General Specialty: mycophenolate 500mg     Other medication(s) to be shipped: No additional medications requested for fill at this time     Amber Bush, DOB: [birth year]  Phone: There are no phone numbers on file.      All above HIPAA information was verified with patient.     Was a Nurse, learning disability used for this call? No    Completed refill call assessment today to schedule patient's medication shipment from the [facility] [phone]).  All relevant notes have been reviewed.     Specialty medication(s) and dose(s) confirmed: Regimen is correct and unchanged.   Changes to medications: Amber Bush reports no changes at this time.  Changes to insurance: No  New side effects reported not previously addressed with a pharmacist or physician: None reported  Questions for the pharmacist: No    Confirmed patient received a Conservation officer, historic buildings and a Surveyor, mining with first shipment. The patient will receive a drug information handout for each medication shipped and additional FDA Medication Guides as required.       DISEASE/MEDICATION-SPECIFIC INFORMATION        N/A    SPECIALTY MEDICATION ADHERENCE     Medication Adherence    Patient reported X missed doses in the last month: 0  Specialty Medication: Mycophenolate  Patient is on additional specialty medications: No  Any gaps in refill history greater than 2 weeks in the last 3 months: no  Demonstrates understanding of importance of adherence: yes  Informant: patient  Reliability of informant: reliable  Confirmed plan for next specialty medication refill: delivery by pharmacy  Refills needed for supportive medications: not needed              Were doses missed due to medication being on hold? No    Mycophenolate 500 mg: 14 days of medicine on hand       REFERRAL TO PHARMACIST     Referral to the pharmacist: Not needed      [facility]     Shipping address confirmed in Epic.     Delivery Scheduled: Yes, Expected medication delivery date: 01/23/22.     Medication will be delivered via Next Day Courier to the prescription address in Epic WAM.    Toren Tucholski D Meda Dudzinski   [facility] Shared [facility] Specialty Technician

## 2022-01-14 NOTE — Unmapped (Signed)
The [facility] has made a second and final attempt to reach this patient to refill the following medication:Mycophenolate.      We have left voicemails on the following phone numbers: [phone] and have sent a Mychart questionnaire..    Dates contacted: 01/08/22, 01/14/22  Last scheduled delivery: 12/17/21    The patient may be at risk of non-compliance with this medication. The patient should call the [facility] at [phone]  Option 4, then Option 2 (all other specialty patients) to refill medication.    Marv Alfrey Celedonio Savage   [facility]

## 2022-01-14 NOTE — Telephone Encounter (Signed)
Would you please ask her to call the office with an update on how she is doing with Auvelity after a few weeks? If it is helpful for her depression it may be worth trying to do a tier exception since she has tried and failed so many meds for depression.  ?

## 2022-01-14 NOTE — Telephone Encounter (Signed)
Pharmacy said Rx was picked up on 01/10/2022 and her co-pay with Medicare is $100.00 for Auvelity.  ?

## 2022-01-17 ENCOUNTER — Telehealth: Payer: Self-pay | Admitting: Psychiatry

## 2022-01-17 NOTE — Telephone Encounter (Signed)
Stacey Alvarez called and is taking Wellbutrin and is having itching bad on her hands since last Friday. What does she need to do? [phone] ?

## 2022-01-17 NOTE — Telephone Encounter (Signed)
Yes, please advised her to stop Auvelity and to use Benadryl prn and Zyrtec nightly until symptoms resolve. Please advise her to go to the ER or urgent care if her reaction worsens or involves her airway (wheezing, shortness of breath, etc).  ?

## 2022-01-17 NOTE — Telephone Encounter (Signed)
Called patient with recommendations. She said she didn't feel like SE would progress in regards to airway issues, but understood the recommendations.  ?

## 2022-01-17 NOTE — Telephone Encounter (Signed)
LVM to RC 

## 2022-01-17 NOTE — Telephone Encounter (Signed)
Patient called back and she said her hands are itching like crazy and her torso is red. She said she doesn't have a rash, she is red like a sunburn. She said she did a trial of Wellbutrin several years ago and had the same result. I told her to stop it and I would let her know your suggestions for going forward.  ?

## 2022-01-22 MED FILL — MYCOPHENOLATE MOFETIL 500 MG TABLET: ORAL | 30 days supply | Qty: 60 | Fill #8

## 2022-01-27 NOTE — Unmapped (Signed)
[facility] CONSULT VISIT       Patient Name: Amber Bush: 66 y.o. (1956-02-26)  Date of Encounter: 01/27/2022  Primary Care Provider:  Royce Macadamia, MD  Referring Provider: Ernie Bush    ASSESSMENT & PLAN:     Reason for visit: Amber Bush is a 66 y.o. female who presents in consultation at the request of Amber Bush for postoperative evaluation as patient has history of DVT and PE .     Diagnosis ICD-10-CM Associated Orders   1. History of DVT (deep vein thrombosis)  Z86.718       2. 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 Personal history of pulmonary embolism  Z86.711       4. 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 Selena Bush on 07/17/2021  -History of provoked bilateral PTA and peroneal vein DVT and bilateral submassive PE with right heart strain in 2018 felt to be related to sedentary lifestyle following rotator cuff surgery and estrogen replacement therapy.  She was treated with Xarelto x 6 months.  No issues since.  -Asymptomatic  -  I independently visualized and reviewed the available imaging from today's bilateral lower extremity venous duplex showing no evidence of acute DVT in either lower extremity  -Patient has been cleared by Dr. Lissa Bush with no other plans for surgery at this time  -Plan for IVC filter retrieval with Dr. Selena Bush, first available  -  Full discussion of procedure / risks / benefits / alternatives to the procedure were discussed with patient today.  Risks involve and are not limited to:  Bleeding, wound complication, wound infection, loss of limb, cardiovascular event (MI, respiratory failure, DVT, PE, stroke), and/or death.  No successful outcome from surgery was made or guaranteed during our discussion today.  Patient is agreeable to proceed with the planned surgical intervention.        Left Ankle Pain  -Resolved  -Encouraged patient to follow-up with Ortho as planned    Hypertension  - Chronic. Stable. BP below goal < 130/80  - home meds: Norvasc, lisinopril  - BP 140/72 in office today  -Follows with primary care      - Patient will return sooner or present to the ED if symptoms worsen or change.       HISTORY OF PRESENT ILLNESS:       BRIEF HISTORY:     Amber Bush (August 14, 1956) is a 66 y.o. female with PMHx detailed below, who presents in follow-up regarding history of DVT and PE      Patient is status post IVC filter placement with Dr. Selena Bush on 07/17/2021.  Patient had a left ankle surgery with Dr. Lissa Bush on 07/20/2021.  Patient states she has been cleared by Dr. Lissa Bush and has no further plans for surgery at this time.    Of note, patient with history of acute DVT involving the right and left posterior tibial veins and peroneal veins as well as bilateral submassive PE with right heart strain in 2018.  This was felt to be provoked by a sedentary lifestyle following rotator cuff surgery and estrogen replacement therapy.  No lytics.  Patient treated with Xarelto for 6 months and has had  no issues since.      No other complaints at this time. All other systems reviewed are negative.    Smoking Status: never smoked.            VASCULAR HISTORY:        07/17/2021 - IVC Filter Placement with Dr. Selena Bush        PMFSHx:     Past Medical History:   Diagnosis Date    Anxiety     Colon polyp     Depression     Diverticulitis of colon     DVT (deep vein thrombosis) in pregnancy     Fibrocystic breast     GERD (gastroesophageal reflux disease)     Hypertension     ILD (interstitial lung disease) (CMS-HCC)     Interstitial lung disease (CMS-HCC)     OSA (obstructive sleep apnea)     PE (pulmonary thromboembolism) (CMS-HCC)     Sciatica     Sleep apnea          Past Surgical History:   Procedure Laterality Date CHOLECYSTECTOMY      HYSTERECTOMY      partial    IC IVC FILTER PLACEMENT ([place] HISTORICAL RESULT)      ORIF ANKLE FRACTURE Left     PR CATH PLACE/CORON ANGIO, IMG SUPER/INTERP,R&L HRT CATH, L HRT VENTRIC N/A 04/02/2021    Procedure: Left/Right Heart Catheterization;  Surgeon: Amber Dy, MD;  Location: [facility] CATH;  Service: Cardiology    PR ESOPHAGEAL MOTILITY STUDY, MANOMETRY N/A 01/16/2017    Procedure: ESOPHAGEAL MOTILITY STUDY W/INT & REP;  Surgeon: Nurse-Based Giproc;  Location: GI PROC[facility]URES [facility];  Service: Gastroenterology    PR GERD TST W/ NASAL IMPEDENCE ELECTROD N/A 01/16/2017    Procedure: ESOPH FUNCT TST NASL ELEC PLCMT;  Surgeon: Nurse-Based Giproc;  Location: GI [facility];  Service: Gastroenterology    PR GERD TST W/ NASAL PH ELECTROD N/A 01/16/2017    Procedure: ESOPHAGEAL 24 HOUR PH MONITORING;  Surgeon: Nurse-Based Giproc;  Location: GI [facility];  Service: Gastroenterology    PR INS INTRVAS VC FILTR W/WO VAS ACS VSL SELXN RS&I Right 07/17/2021    Procedure: INFERIOR VENA CAVA FILTER PLACEMENT;  Surgeon: Amber Hew, MD;  Location: [place] CATH;  Service: Cardiology    PR REMOVAL DEEP IMPLANT Left 07/20/2021    Procedure: REMOVAL OF LEFT ANKLE HARDWARE;  Surgeon: Arva Chafe, MD;  Location: OR [place];  Service: Orthopedics    PR UP GI ENDOSCOPY,BALL DIL,30MM N/A 10/28/2017    Procedure: UGI ENDO; W/BALLOON DILAT ESOPHAGUS (<30MM DIAM);  Surgeon: Liane Comber, MD;  [facility];  Service: Gastroenterology    PR UPPER GI ENDOSCOPY,BIOPSY Left 10/28/2017    Procedure: UGI ENDOSCOPY; WITH BIOPSY, SINGLE OR MULTIPLE;  Surgeon: Liane Comber, MD;  Location: [facility];  Service: Gastroenterology    ROTATOR CUFF REPAIR Right 07/2016             Family History   Problem Relation Age of Onset    Heart disease Mother     Hypertension Mother     Kidney disease Mother     Heart disease Father     Hypertension Father Kidney disease Father              Social History     Socioeconomic History    Marital status: Widowed   Tobacco Use    Smoking status: Never  Smokeless tobacco: Never   Vaping Use    Vaping Use: Never used   Substance and Sexual Activity    Alcohol use: No     Alcohol/week: 0.0 standard drinks    Drug use: No    Sexual activity: Not Currently         Current Allergy List  Allergies   Allergen Reactions    Trazodone Other (See Comments)     Paresthesias, weakness    Sulfa (Sulfonamide Antibiotics) Rash       Current Medication List  Current Outpatient Medications   Medication Sig Dispense Refill    acetaminophen (TYLENOL) 325 MG tablet Take 2 tablets (650 mg total) by mouth every six (6) hours as needed for pain. Start this when you get home and take as a scheduled medication through Sunday.  Then take as needed for pain as directed on the bottle. 100 tablet 2    ALPRAZolam (XANAX) 1 MG tablet Take 1 tablet (1 mg total) by mouth every morning.      amLODIPine (NORVASC) 5 MG tablet Take 0.5 tablets (2.5 mg total) by mouth every morning.      baclofen (LIORESAL) 10 MG tablet Take 1 tablet (10 mg total) by mouth nightly.      cetirizine (ZYRTEC) 10 MG tablet Take 1 tablet (10 mg total) by mouth every morning.      fenofibrate micronized (LOFIBRA) 134 MG capsule Take 1 capsule (134 mg total) by mouth daily before breakfast.      fluticasone (FLONASE) 50 mcg/actuation nasal spray 1 spray into each nostril every morning.      lamoTRIgine (LAMICTAL) 100 MG tablet Take 1 tablet (100 mg total) by mouth every morning.      lisinopril (PRINIVIL,ZESTRIL) 20 MG tablet Take 1 tablet (20 mg total) by mouth every morning.      mycophenolate (CELLCEPT) 500 mg tablet Take 1 tablet (500 mg total) by mouth Two (2) times a day. 60 tablet 11    pantoprazole (PROTONIX) 40 MG tablet Take 1 tablet (40 mg total) by mouth every morning.      PARoxetine (PAXIL) 30 MG tablet Take 1 tablet (30 mg total) by mouth every morning. pravastatin (PRAVACHOL) 40 MG tablet Take 1 tablet (40 mg total) by mouth every morning.      predniSONE (DELTASONE) 2.5 MG tablet Take 1 tablet (2.5 mg total) by mouth daily. (Patient taking differently: Take 1 tablet (2.5 mg total) by mouth every morning.) 90 tablet 3    promethazine (PHENERGAN) 25 MG tablet Take 1 tablet (25 mg total) by mouth every six (6) hours as needed for nausea.      temAZEpam (RESTORIL) 30 mg capsule Take 1 capsule (30 mg total) by mouth nightly as needed for sleep.       No current facility-administered medications for this visit.       CLINICAL EXAM:     There were no vitals filed for this visit.    There is no height or weight on file to calculate BMI.      Physical Exam      Constitutional - No acute distress, well appearing and well nourished  Eyes: Lids are normal without ptosis, edema, ectropion or entropion. Conjunctivae are normal and without inflammation, injection, hemorrhages or exudates.  Ear/Nose/Mouth/Throat - No drainage from the ears, oral and nasal mucosa moist. Tongue is normal and without lesions.  Head and Face - Face is symmetrical, no active lesions, or abrasions  Neck - Supple,  symmetric. Trachea midline.  No masses. No JVD. No carotid bruits.   Cardiovascular - RRR. + 2/6 murmur. No rubs, or gallops. PMI not displaced. No thrills, lifts, or heaves.  Respiratory -  No increased work of breathing or signs of respiratory distress. Symmetrical lung expansion. Clear to auscultation.  Musculoskeletal - Muscle strength and tone normal. No atrophy or abnormal movements noted.  Patient walks with a cane  Extremities - Extremities are warm bilaterally. No wounds.  Radials palpable and equal bilaterally.  Skin - no rashes  Neurologic - CN II-XII grossly intact, no focal deficits noted  Psychiatric - Oriented to person, place, and time.  Mood and affect is appropriate.      ANCILLARY DATA:     NA         Yehuda Budd, FNP-C  [facility]  [address], [address]  [place], [place] [address]  [phone] (office)  [phone] (fax)      This note was generated utilizing Dragon autodictation software.  Attempt has been made to correct errors but residual errors may remain and do not reflect the care provided.

## 2022-01-28 ENCOUNTER — Ambulatory Visit: Admit: 2022-01-28 | Discharge: 2022-01-29 | Payer: MEDICARE

## 2022-01-28 ENCOUNTER — Ambulatory Visit: Admit: 2022-01-28 | Discharge: 2022-01-29 | Payer: MEDICARE | Attending: Family | Primary: Family

## 2022-01-28 DIAGNOSIS — Z86718 Personal history of other venous thrombosis and embolism: Principal | ICD-10-CM

## 2022-01-28 DIAGNOSIS — I82509 Chronic embolism and thrombosis of unspecified deep veins of unspecified lower extremity: Principal | ICD-10-CM

## 2022-01-28 DIAGNOSIS — Z86711 Personal history of pulmonary embolism: Principal | ICD-10-CM

## 2022-01-28 DIAGNOSIS — M25572 Pain in left ankle and joints of left foot: Principal | ICD-10-CM

## 2022-01-28 NOTE — Unmapped (Signed)
IVC filter retrieval    You have been scheduled for IVC FILTER RETRIEVAL on  ________________ by Dr. Toribio Harbour      Before Your Procedure    You will receive a phone call from our clinical team members. The nurse will review your medical history and medications. Please have a current medication list available.    If you have been requested to have your kidney function checked by the lab (Creatinine) please complete prior to your surgery date.    The day before your procedure, you will be contacted between the hours of 3 p.m. and 5 p.m. with your arrival time. If you do not receive a call by 5 p.m. please call [phone].    You may receive a phone call from the hospital for pre-registration information or from a pre-procedure nurse to discuss your procedure.    Nothing to eat or drink after midnight, the night before your procedure. However you will be given specific instructions when the hospital calls you the day before with your arrival time.    If your procedure requires pre-certification or permission from your insurance, this office will make arrangements with your insurance company prior to your procedure. You will be notified if there are any problems.    If you have any questions about preparing for your procedure please contact this physician's office at [phone]. If you have any questions about [facility], navigating, parking, nearby lodging, or any other questions please call Benny at [phone].      Medication Instructions    Take your usual medications with a sip of water the morning of your procedure with the exception of:     Not stopping any medicines  DO NOT stop your Aspirin, Plavix, Effient, Brilinta, Aggrenox, or Pletal.      The Day of Your Procedure    Please go to the [facility] main entrance If you are using GPS navigation, use the address [address] [place] [address]..   Enter the Hospital campus and the signs will direct you.     Go to the RECEPTION/REGISTRATION DESK.    Please bring a valid photo I.D., Insurance cards, and a list of your current medications.    Please note, you do not have a specific time scheduled for your procedure. Our goal is to have your procedure started within 3 hours of your arrival. We will do our best to keep you informed of any delays should they occur.    You are welcome to bring your favorite music, a good book or personal device to use during your wait.    You must have a responsible person remain at the hospital for the duration of your procedure to drive you home. Your procedure may be cancelled if you do not have a ride. Please let us know if transportation is a concern.    You will have activity restrictions immediately, and for a couple days, after your procedure. You will receive more information about your specific restrictions from your doctor and your nurse after your procedure.    You may be discharged home the same day of your procedure or you may stay overnight in the hospital. Please come prepared for either possibility.    If you stay overnight, you will be discharged early the next morning. Please ensure you have a ride available by 8 am the day after your procedure.    You may need a follow-up appointment at your physician???s  office after your procedure. Your doctor will tell you if and when you should be evaluated again.    If you are parking at the [place] location:  Free valet parking is available in front of the [facility], and free self-parking is available in the [facility] Parking Deck, located to the left of the building.        If you have any questions, please call     [facility] - [phone]     Geralyn Corwin - [phone]        Gaynell Face- [phone]        Prudy Feeler [phone]

## 2022-02-13 NOTE — Unmapped (Signed)
[facility] Refill Coordination Note    Specialty Medication(s) to be Shipped:   Infectious Disease: Mycophenolate 500mg  Tab   Other medication(s) to be shipped: No additional medications requested for fill at this time     Amber Bush, DOB: 07-21-1956  Phone: There are no phone numbers on file.    All above HIPAA information was verified with patient.     Was a Nurse, learning disability used for this call? No    Completed refill call assessment today to schedule patient's medication shipment from the [facility] [phone]).  All relevant notes have been reviewed.     Specialty medication(s) and dose(s) confirmed: Regimen is correct and unchanged.   Changes to medications: [name] reports no changes at this time.  Changes to insurance: No  New side effects reported not previously addressed with a pharmacist or physician: None reported  Questions for the pharmacist: No    Confirmed patient received a Conservation officer, historic buildings and a Surveyor, mining with first shipment. The patient will receive a drug information handout for each medication shipped and additional FDA Medication Guides as required.       DISEASE/MEDICATION-SPECIFIC INFORMATION        N/A    SPECIALTY MEDICATION ADHERENCE     Medication Adherence    Patient reported X missed doses in the last month: 0  Specialty Medication: Mycophenolate 500mg  Tab  Patient is on additional specialty medications: No  Patient is on more than two specialty medications: No  Informant: patient  Reliability of informant: reliable  Reasons for non-adherence: no problems identified        Were doses missed due to medication being on hold? No    Mycophenolate 500mg  Tab: 10 days of medicine on hand     REFERRAL TO PHARMACIST     Referral to the pharmacist: Not needed    [facility]     Shipping address confirmed in Epic.     Delivery Scheduled: Yes, Expected medication delivery date: 02/21/2022.     Medication will be delivered via Same Day Courier to the prescription address in Epic WAM.    Chante Mayson P Allena Katz   [facility] Shared [facility] Specialty Technician

## 2022-02-14 NOTE — Unmapped (Signed)
Called patient and confirmed arrival time of 1330 for surgery tomorrow. Reminded patient not to eat or drink anything tonight after midnight and to follow any medication instructions from the nurse. Patient verbalized understanding and agreed. KH

## 2022-02-15 NOTE — Unmapped (Signed)
Amber Bush called and spoke with the front desk here at the [facility] location and stated that she was not feeling well and would not make it to her surgical procedure this afternoon with Dr. Selena Batten. Notified Dr. Selena Batten and I will call the patient to reschedule.

## 2022-02-21 MED FILL — MYCOPHENOLATE MOFETIL 500 MG TABLET: ORAL | 30 days supply | Qty: 60 | Fill #9

## 2022-02-22 ENCOUNTER — Telehealth: Payer: Self-pay | Admitting: Psychiatry

## 2022-02-22 DIAGNOSIS — F411 Generalized anxiety disorder: Secondary | ICD-10-CM

## 2022-02-22 DIAGNOSIS — F330 Major depressive disorder, recurrent, mild: Secondary | ICD-10-CM

## 2022-02-22 NOTE — Telephone Encounter (Signed)
Please review when you return pt is aware you are out of office

## 2022-02-22 NOTE — Telephone Encounter (Signed)
Pt wants to know what Janett Billow thinks about increasing dosage on one or both of the Lamictal and Paxil.  She has done some research and she is wonder if '150mg'$  on the Lamictal might help her.  She has some '100mg'$  pills left, so if this is a med it's ok to split, she can increase up to the 150 and then get a script sent for the 150.  On the Paxil, she is wondering about increasing it from '30mg'$  to '40mg'$ .    She needs the script sent to [facility] [address] [place], [place] [address] [phone]  Next appt 7/24

## 2022-02-25 MED ORDER — PAROXETINE HCL 40 MG PO TABS: 40.0000 mg | ORAL_TABLET | Freq: Every day | ORAL | 1 refills | Status: DC

## 2022-02-25 NOTE — Unmapped (Signed)
Spoke with patient to confirm date, time of arrival 6:30 am and location of their procedure with Dr. Selena Batten.  Advised not to eat or drink anything after midnight tonight and to follow any medication instructions given by the nurse.  Patient verbalized understanding.

## 2022-02-25 NOTE — Telephone Encounter (Signed)
Pt informed

## 2022-02-25 NOTE — Telephone Encounter (Signed)
Recommend increasing Paxil to 40 mg daily since Paxil has seemed to be helpful for her mood and anxiety in the past and she has not been at this dose before. She has been on Lamictal 150 mg in the past and felt like this was too much. Prefer to make only one change at a time. Script for Paxil 40 mg sent to pharmacy.

## 2022-02-26 ENCOUNTER — Ambulatory Visit: Admit: 2022-02-26 | Discharge: 2022-02-26 | Payer: MEDICARE

## 2022-02-26 MED ADMIN — fentaNYL (PF) (SUBLIMAZE) injection: INTRAVENOUS | @ 13:00:00 | Stop: 2022-02-26

## 2022-02-26 MED ADMIN — sodium chloride (NS) 0.9 % infusion: 100 mL/h | INTRAVENOUS | @ 11:00:00 | Stop: 2022-02-26

## 2022-02-26 MED ADMIN — midazolam (VERSED) injection: INTRAVENOUS | @ 13:00:00 | Stop: 2022-02-26

## 2022-02-26 MED ADMIN — ondansetron (ZOFRAN) injection: INTRAVENOUS | @ 14:00:00 | Stop: 2022-02-26

## 2022-02-26 MED ADMIN — lidocaine (PF) (XYLOCAINE-MPF) 10 mg/mL (1 %) injection: @ 13:00:00 | Stop: 2022-02-26

## 2022-02-26 MED ADMIN — iohexoL (OMNIPAQUE) 350 mg iodine/mL solution: INTRAVENOUS | @ 13:00:00 | Stop: 2022-02-26

## 2022-02-26 MED ADMIN — heparin (porcine) 1000 unit/mL injection: INTRAVENOUS | @ 13:00:00 | Stop: 2022-02-26

## 2022-02-26 NOTE — Unmapped (Signed)
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 Prior to the administration of sedation, the patient was evaluated with pre procedure documentation completed - refer to Immediate Pre Procedure Airway & Anesthesia/Pre-Sedation Reassessment Form.   Moderate / Deep Sedation with 9 mg Versed and 100 mcg Fentanyl was used for this procedure.  Please access the procedural documentation for dosages of medications used.  I continuously monitored the patient during the administration of moderate / deep sedation for the duration of this procedure for a total time of 40 minutes. The procedural nurse was present for the duration of the procedure to assist in monitoring the patient.  Patient assessed post sedation/anesthesia and tolerated well.  Vital signs stable and no complications noted.      PRIMARY SURGEON:  Amada Kingfisher, MD    ESTIMATED BLOOD LOSS:  1 mL    Prox IVC located medially and offset with small orifice    INDICATIONS:  This is a 66 y.o. female who had a retrievable IVC filter placed for prophylaxis against pulmonary embolism.  The need for the filter has been resolved and the patient now presents for IVC filter removal.      PROCEDURE IN DETAIL:  The patient was identified in the pre-operative area. The risks, benefits and alternatives of the procedure were described to the patient.  The patient was given the opportunity to ask questions and all questions were answered to the patient's satisfaction.  Informed consent for the procedure was obtained.   A consent form was signed, witnessed and placed in the patients chart.    The patient was transferred to the angiosuite. Upon entry into the operating room the initial time-out verification was performed; verifying the patient identity / medical record number, procedural site, patient allergies, presence of a signed, witnessed and valid consent form for the procedure.    The patient was positioned supine on the angio table. After administration of appropriate anesthesia, the patient's right neck was prepped and draped in sterile fashion.  Utilizing the ultrasound, the right internal jugular vein was identified, noted to be patent and suitable for use, and needle and wire was then placed within the vein without difficulty.  The ultrasound image depicting this was saved and filed. Hard copy images and reporting are documented.  The needle was removed and replaced with a micro sheath.  An 0.035 wire was then placed into the vena cava. The skin incision was augmented with a scalpel. The microsheath was exchanged for a dilator and then the IVC filter retrieval sheath was then placed. The patient was systemically heparinized. A venogram was performed which demonstrated that there was no evidence of a filling defect within the IVC.  The filter was then secured with the snare and placed within the retrieval sheath and then removed.  A venogram was performed that demonstrated no evidence of contrast extravasation within the vena cava or other abnormalities.  The sheath was then withdrawn and manual pressure was held over the insertion site for five minutes with complete hemostasis.  The patient tolerated the procedure well and was taken to the recovery area in stable condition. There were no immediate peri-procedural complications.      Amada Kingfisher, MD FACS

## 2022-02-26 NOTE — Unmapped (Signed)
Spoke with patient regarding her request to reschedule her surgery and agreed on surgery date of 02/26/2022 with Dr. Selena Batten. Informed them that they would receive a call from one of our nurses to go over more surgery/medication information; informed patient I would call the day prior to surgery between 1500-1700 with their arrival time. Patient verbalized understanding and agreed.

## 2022-03-11 MED ORDER — CODEINE 10 MG-GUAIFENESIN 100 MG/5 ML ORAL LIQUID
Freq: Three times a day (TID) | ORAL | 0 refills | 8 days | Status: CP | PRN
Start: 2022-03-11 — End: 2022-03-25

## 2022-03-20 NOTE — Unmapped (Signed)
[facility] Refill Coordination Note    Specialty Medication(s) to be Shipped:   General Specialty: mycophenolate 500mg     Other medication(s) to be shipped: No additional medications requested for fill at this time     Amber Bush, DOB: [birth year]  Phone: [phone] (home)       All above HIPAA information was verified with patient.     Was a Nurse, learning disability used for this call? No    Completed refill call assessment today to schedule patient's medication shipment from the [facility] [phone]).  All relevant notes have been reviewed.     Specialty medication(s) and dose(s) confirmed: Regimen is correct and unchanged.   Changes to medications: Amber Bush reports no changes at this time.  Changes to insurance: No  New side effects reported not previously addressed with a pharmacist or physician: None reported  Questions for the pharmacist: No    Confirmed patient received a Conservation officer, historic buildings and a Surveyor, mining with first shipment. The patient will receive a drug information handout for each medication shipped and additional FDA Medication Guides as required.       DISEASE/MEDICATION-SPECIFIC INFORMATION        N/A    SPECIALTY MEDICATION ADHERENCE     Medication Adherence    Patient reported X missed doses in the last month: 0  Specialty Medication: Mycophenolate 500mg   Patient is on additional specialty medications: No  Patient is on more than two specialty medications: No              Were doses missed due to medication being on hold? No    Mycophenolate 500 mg: 14 days of medicine on hand       REFERRAL TO PHARMACIST     Referral to the pharmacist: Not needed      [facility]     Shipping address confirmed in Epic.     Delivery Scheduled: Yes, Expected medication delivery date: 03/29/22.     Medication will be delivered via Next Day Courier to the prescription address in Epic WAM.    Nancy Nordmann [facility] Specialty Technician

## 2022-03-26 ENCOUNTER — Other Ambulatory Visit: Payer: Self-pay

## 2022-03-26 ENCOUNTER — Telehealth: Payer: Self-pay | Admitting: Psychiatry

## 2022-03-26 DIAGNOSIS — F411 Generalized anxiety disorder: Secondary | ICD-10-CM

## 2022-03-26 MED ORDER — ALPRAZOLAM 1 MG PO TABS: ORAL_TABLET | ORAL | 0 refills | Status: DC

## 2022-03-26 NOTE — Telephone Encounter (Signed)
Pt called at 9 am asking for a refill on her xanax. She usually gets a 90 day supply. Pharmacy [facility] [address], [address], [place]

## 2022-03-26 NOTE — Telephone Encounter (Signed)
Pended.

## 2022-03-28 MED FILL — MYCOPHENOLATE MOFETIL 500 MG TABLET: ORAL | 30 days supply | Qty: 60 | Fill #10

## 2022-04-03 MED ORDER — CODEINE 10 MG-GUAIFENESIN 100 MG/5 ML ORAL LIQUID
Freq: Three times a day (TID) | ORAL | 3 refills | 16 days | Status: CP | PRN
Start: 2022-04-03 — End: 2022-09-30

## 2022-04-08 ENCOUNTER — Encounter: Payer: Self-pay | Admitting: Psychiatry

## 2022-04-08 ENCOUNTER — Telehealth (INDEPENDENT_AMBULATORY_CARE_PROVIDER_SITE_OTHER): Payer: Medicare Other | Admitting: Psychiatry

## 2022-04-08 DIAGNOSIS — F330 Major depressive disorder, recurrent, mild: Secondary | ICD-10-CM | POA: Diagnosis not present

## 2022-04-08 DIAGNOSIS — F332 Major depressive disorder, recurrent severe without psychotic features: Secondary | ICD-10-CM

## 2022-04-08 DIAGNOSIS — F411 Generalized anxiety disorder: Secondary | ICD-10-CM | POA: Diagnosis not present

## 2022-04-08 DIAGNOSIS — F5101 Primary insomnia: Secondary | ICD-10-CM | POA: Diagnosis not present

## 2022-04-08 DIAGNOSIS — R059 Cough: Principal | ICD-10-CM

## 2022-04-08 MED ORDER — TEMAZEPAM 30 MG PO CAPS: 30.0000 mg | ORAL_CAPSULE | Freq: Every day | ORAL | 0 refills | Status: DC

## 2022-04-08 MED ORDER — LAMOTRIGINE 150 MG PO TABS: 150.0000 mg | ORAL_TABLET | Freq: Every day | ORAL | 0 refills | Status: DC

## 2022-04-08 MED ORDER — PAROXETINE HCL 40 MG PO TABS: 40.0000 mg | ORAL_TABLET | Freq: Every day | ORAL | 0 refills | Status: DC

## 2022-04-08 MED ORDER — PREDNISONE 2.5 MG TABLET
ORAL_TABLET | Freq: Every morning | ORAL | 3 refills | 90 days | Status: CP
Start: 2022-04-08 — End: 2023-04-08
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
## 2022-04-17 DIAGNOSIS — M359 Systemic involvement of connective tissue, unspecified: Principal | ICD-10-CM

## 2022-04-17 MED ORDER — MYCOPHENOLATE MOFETIL 500 MG TABLET
ORAL_TABLET | Freq: Two times a day (BID) | ORAL | 11 refills | 30 days | Status: CP
Start: 2022-04-17 — End: 2023-04-17
  Filled 2022-04-30: qty 60, 30d supply, fill #0

## 2022-04-26 DIAGNOSIS — J849 Interstitial pulmonary disease, unspecified: Principal | ICD-10-CM

## 2022-04-26 DIAGNOSIS — R0602 Shortness of breath: Principal | ICD-10-CM

## 2022-04-29 ENCOUNTER — Telehealth: Payer: Self-pay | Admitting: Psychiatry

## 2022-04-29 NOTE — Unmapped (Signed)
[facility] Refill Coordination Note    Specialty Medication(s) to be Shipped:   Inflammatory Disorders: mycophenolate    Other medication(s) to be shipped: No additional medications requested for fill at this time     Amber Bush, DOB: [birth year]  Phone: [phone] (home)       All above HIPAA information was verified with patient.     Was a Nurse, learning disability used for this call? No    Completed refill call assessment today to schedule patient's medication shipment from the [facility] [phone]).  All relevant notes have been reviewed.     Specialty medication(s) and dose(s) confirmed: Regimen is correct and unchanged.   Changes to medications: Amber Bush reports no changes at this time.  Changes to insurance: No  New side effects reported not previously addressed with a pharmacist or physician: None reported  Questions for the pharmacist: No    Confirmed patient received a Conservation officer, historic buildings and a Surveyor, mining with first shipment. The patient will receive a drug information handout for each medication shipped and additional FDA Medication Guides as required.       DISEASE/MEDICATION-SPECIFIC INFORMATION        N/A    SPECIALTY MEDICATION ADHERENCE     Medication Adherence    Patient reported X missed doses in the last month: 0  Specialty Medication: Mycophenolate 500mg   Patient is on additional specialty medications: No  Patient is on more than two specialty medications: No  Any gaps in refill history greater than 2 weeks in the last 3 months: no  Demonstrates understanding of importance of adherence: yes  Informant: patient  Reliability of informant: reliable  Provider-estimated medication adherence level: good  Patient is at risk for Non-Adherence: No  Reasons for non-adherence: no problems identified                  Confirmed plan for next specialty medication refill: delivery by pharmacy  Refills needed for supportive medications: not needed          Refill Coordination    Has the Patients' Contact Information Changed: No  Is the Shipping Address Different: No         Were doses missed due to medication being on hold? No    Mycophenolate 500 mg: 7 days of medicine on hand       REFERRAL TO PHARMACIST     Referral to the pharmacist: Not needed      [facility]     Shipping address confirmed in Epic.     Delivery Scheduled: Yes, Expected medication delivery date: 08/16.     Medication will be delivered via Next Day Courier to the prescription address in Epic WAM.    [name]   [facility] Specialty Technician
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
## 2022-04-29 NOTE — Telephone Encounter (Signed)
Recommend that she hold Lamictal for 2 days to help lower blood levels and then resume 100 mg dose if redness is improving and no rash. Please advise her to call office or seek medical treatment if it worsens. Also recommend taking an antihistamine, ie Benadryl or Zyrtec to help with redness/burning. Would you please follow-up with her again tomorrow to see how she is doing?

## 2022-04-29 NOTE — Telephone Encounter (Signed)
Spoke to pt.will call back to check on her in the morning
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
## 2022-04-30 NOTE — Telephone Encounter (Signed)
Called pt to check on her,no answer LVM

## 2022-05-28 NOTE — Unmapped (Signed)
[facility] Refill Coordination Note    Specialty Medication(s) to be Shipped:   Inflammatory Disorders: mycophenolate    Other medication(s) to be shipped: No additional medications requested for fill at this time     Amber Bush, DOB: 06-Sep-1956  Phone: [phone] (home)       All above HIPAA information was verified with patient.     Was a Nurse, learning disability used for this call? No    Completed refill call assessment today to schedule patient's medication shipment from the [facility] [phone]).  All relevant notes have been reviewed.     Specialty medication(s) and dose(s) confirmed: Regimen is correct and unchanged.   Changes to medications: Amber Bush reports stopping the following medications: Prednisone  2.5mg   -  temporarilly  stopped    Changes to insurance: No  New side effects reported not previously addressed with a pharmacist or physician: None reported  Questions for the pharmacist: No    Confirmed patient received a Conservation officer, historic buildings and a Surveyor, mining with first shipment. The patient will receive a drug information handout for each medication shipped and additional FDA Medication Guides as required.       DISEASE/MEDICATION-SPECIFIC INFORMATION        N/A    SPECIALTY MEDICATION ADHERENCE     Medication Adherence    Patient reported X missed doses in the last month: 0  Specialty Medication: Mycophenolate 500mg   Patient is on additional specialty medications: No  Patient is on more than two specialty medications: No  Any gaps in refill history greater than 2 weeks in the last 3 months: no  Demonstrates understanding of importance of adherence: yes                          Were doses missed due to medication being on hold? No    Mycophenolate 500 mg: 7 days of medicine on hand       REFERRAL TO PHARMACIST     Referral to the pharmacist: Not needed      [facility]     Shipping address confirmed in Epic.     Delivery Scheduled: Yes, Expected medication delivery date: 05/31/22 .     Medication will be delivered via Next Day Courier to the prescription address in Epic WAM.    [name]   [facility] Specialty Technician

## 2022-05-30 MED FILL — MYCOPHENOLATE MOFETIL 500 MG TABLET: ORAL | 30 days supply | Qty: 60 | Fill #1

## 2022-06-12 MED ORDER — CODEINE 10 MG-GUAIFENESIN 100 MG/5 ML ORAL LIQUID
Freq: Three times a day (TID) | ORAL | 3 refills | 16 days | Status: CP | PRN
Start: 2022-06-12 — End: 2022-12-09

## 2022-07-01 NOTE — Unmapped (Signed)
[facility] Refill Coordination Note    Amber Bush, DOB: [birth year]  Phone: [phone] (home)       All above HIPAA information was verified with patient.         06/30/2022    11:22 PM   Specialty Rx Medication Refill Questionnaire   Which Medications would you like refilled and shipped? Mycophenolate   Please list all current allergies: Sulfa   Have you missed any doses in the last 30 days? No   Have you had any changes to your medication(s) since your last refill? No   How many days remaining of each medication do you have at home? 6   Have you experienced any side effects in the last 30 days? No   Please enter the full address (street address, city, state, zip code) where you would like your medication(s) to be delivered to. [address]  [address] [place] [address], Enter main lobby at [address]. Use keypad on wall and push #[phone]. The door to access the elevator will unlock. Leave at my apartment door #[phone]   Please specify on which day you would like your medication(s) to arrive. Note: if you need your medication(s) within 3 days, please call the pharmacy to schedule your order at [phone]  07/04/2022   Has your insurance changed since your last refill? No   Would you like a pharmacist to call you to discuss your medication(s)? No   Do you require a signature for your package? (Note: if we are billing Medicare Part B or your order contains a controlled substance, we will require a signature) No         Completed refill call assessment today to schedule patient's medication shipment from the [facility] [phone]).  All relevant notes have been reviewed.       Confirmed patient received a Conservation officer, historic buildings and a Surveyor, mining with first shipment. The patient will receive a drug information handout for each medication shipped and additional FDA Medication Guides as required.         REFERRAL TO PHARMACIST     Referral to the pharmacist: Not needed      [facility]     Shipping address confirmed in Epic.     Delivery Scheduled: Yes, Expected medication delivery date: 07/04/22 .     Medication will be delivered via Same Day Courier to the prescription address in Epic WAM.    [name]   [facility] Specialty Technician

## 2022-07-04 MED FILL — MYCOPHENOLATE MOFETIL 500 MG TABLET: ORAL | 30 days supply | Qty: 60 | Fill #2

## 2022-07-09 ENCOUNTER — Encounter: Payer: Self-pay | Admitting: Psychiatry

## 2022-07-09 ENCOUNTER — Telehealth (INDEPENDENT_AMBULATORY_CARE_PROVIDER_SITE_OTHER): Payer: Medicare Other | Admitting: Psychiatry

## 2022-07-09 DIAGNOSIS — F411 Generalized anxiety disorder: Secondary | ICD-10-CM | POA: Diagnosis not present

## 2022-07-09 DIAGNOSIS — F5101 Primary insomnia: Secondary | ICD-10-CM | POA: Diagnosis not present

## 2022-07-09 DIAGNOSIS — F330 Major depressive disorder, recurrent, mild: Secondary | ICD-10-CM

## 2022-07-09 MED ORDER — ALPRAZOLAM 1 MG PO TABS: 1.0000 mg | ORAL_TABLET | Freq: Three times a day (TID) | ORAL | 0 refills | Status: AC

## 2022-07-09 MED ORDER — TEMAZEPAM 30 MG PO CAPS: 30.0000 mg | ORAL_CAPSULE | Freq: Every day | ORAL | 0 refills | Status: AC

## 2022-07-09 NOTE — Progress Notes (Signed)
Stacey Alvarez [id number] 1956/03/02 66 y.o.  Virtual Visit via Video Note  I connected with pt @ on 07/09/22 at 10:00 AM EDT by a video enabled telemedicine application and verified that I am speaking with the correct person using two identifiers.   I discussed the limitations of evaluation and management by telemedicine and the availability of in person appointments. The patient expressed understanding and agreed to proceed.  I discussed the assessment and treatment plan with the patient. The patient was provided an opportunity to ask questions and all were answered. The patient agreed with the plan and demonstrated an understanding of the instructions.   The patient was advised to call back or seek an in-person evaluation if the symptoms worsen or if the condition fails to improve as anticipated.  I provided 30 minutes of non-face-to-face time during this encounter.  The patient was located at home in [place], [place].  The provider was located at [place].   Stacey Alvarez, PMHNP   Subjective:   Patient ID:  Stacey Alvarez is a 66 y.o. (DOB September 12, 1956) female.  Chief Complaint:  Chief Complaint  Patient presents with   Follow-up    Depression, anxiety, and insomnia    HPI Stacey Alvarez presents for follow-up of depression and anxiety. She reports that her mood is "fair. I think I feel a little more on the positive side." She reports that she continues to have some depression. She reports that she has had several friends die. Learned last night that an old friend/neighbor died. She reports that she is trying to get out more and will go to lunch with a friend when invited "even when at first I don't want to." She reports that she is not spending as much and has been donating things she is not using. Some improvement in energy and motivation. She reports that she is not sleeping as well. She notices she sleeps better at night when she has been more active during the day. She  reports decreased appetite and reports eating sandwiches or a bowl of cereal for dinner. She reports that her concentration has not worsened. Denies SI.   She reports that she broke out in a rash around the time she increased Lamictal and it was on her trunk and under her arms. She reports that she reduced Lamictal and came off of Lamictal about 2 weeks ago. She reports that she saw a dermatologist recently who said that rash is not [name] syndrome, shingles, or a fungal infection. She reports that they did a needle biopsy and said it was dermatitis. She reports that dermatologist prescribed a topical medication.   She reports that her PCP talked with her about reducing Alprazolam and she decreased Alprazolam by one tab at bedtime 3-4 weeks ago. She reports taking Alprazolam since 1996. She reports that she has done "ok" with dose reduction and noticed she initially was waking up some and clinching her teeth.   She reports that her anxiety is "in a pretty good place right now." She reports that she had one episode of increased anxiety 1-2 weeks ago. She reports that rumination seems to be improving. She has been reaching out to friends when she has something on her mind.   She reports that she has had some recent health issues. She reports some urinary hesitancy and retention and this has improved. She reports that her creatinine and GFR have been ok. She reports that she also had a UTI.   She is now off  prednisone.   She went to a wedding this weekend with her daughter. She reports that she and her daughter are getting along well. Reports that daughter seems to be happy and doing well.   She has found a psychiatric nurse practitioner locally and has an initial evaluation scheduled. She reports that her insurance may cover local provider.   She continues to see Beckey Downing, LCAS for therapy about every 3-4 weeks.   Temazepam last filled 04/09/22. Alprazolam last filled 03/27/22. Clonazepam 1  mg #14 last filled 07/04/22.   Past Psychiatric Medication Trials: (Reports sensitivity to medication) Lamictal- reports that 150 mg felt that this was too high. Lithium- Ineffective Paxil- Took years ago in combination with other medications Lexapro-ineffective Zoloft- ineffective Prozac- Has tremor and unsteadiness on 80 mg. Has taken about 5 years. Unsure of benefit. Effexor XR- adverse reaction. Confusion. Cymbalta- Ineffective.  Remeron- Wt gain. Trintellix- GI side effects. Some partial improvement., Itching on palms Wellbutrin- rash during clinical trials.  Auvelity- allergic reaction Gabapentin- felt unsteady. Suppressed breathing.  Lyrica-Suppressed breathing. Temazepam Xanax Abilify-Ineffective.  Risperdal- wt gain Rexulti- Akathisia  Latuda        Viibryd- Reports that Dr. Toy Care has told her that she was on it and she does not recall this.  Propranolol- felt "revved up."  Trazodone- Adverse effects. Ritalin- shortness of breath ~~ Limited benefit with TMS  Review of Systems:  Review of Systems  Genitourinary:        Improved urination  Musculoskeletal:  Positive for arthralgias and back pain. Negative for gait problem.  Skin:  Positive for rash.  Psychiatric/Behavioral:         Please refer to HPI    Medications: I have reviewed the patient's current medications.  Current Outpatient Medications  Medication Sig Dispense Refill   amLODipine (NORVASC) 5 MG tablet TAKE 1/2 TABLET BY MOUTH IN THE MORNING AND 1 TABLET AT BEDTIME (Patient taking differently: 5 mg daily.) 135 tablet 3   butalbital-aspirin-caffeine-codeine (FIORINAL WITH CODEINE) 50-325-40-30 MG capsule Take 1 capsule by mouth every 6 (six) hours as needed for pain. 21 capsule 0   cetirizine (ZYRTEC) 10 MG tablet Take 10 mg by mouth daily.     fenofibrate 160 MG tablet Take 1 tablet (160 mg total) by mouth daily. (Patient taking differently: Take 134 mg by mouth daily.) 30 tablet 0   fluticasone  (FLONASE) 50 MCG/ACT nasal spray Place 1 spray into both nostrils daily.     lisinopril (ZESTRIL) 20 MG tablet Take 1 tablet (20 mg total) by mouth daily. 90 tablet 0   mycophenolate (CELLCEPT) 500 MG tablet Take 500 mg by mouth 2 (two) times daily.      pantoprazole (PROTONIX) 40 MG tablet Take 1 tablet (40 mg total) by mouth 2 (two) times daily. (Patient taking differently: Take 40 mg by mouth daily.) 120 tablet 0   pravastatin (PRAVACHOL) 40 MG tablet Take 1 tablet (40 mg total) by mouth daily. Need appointment 90 tablet 0   ALPRAZolam (XANAX) 1 MG tablet Take 1 tablet (1 mg total) by mouth 3 (three) times daily. 90 tablet 0   HYDROcodone-acetaminophen (NORCO/VICODIN) 5-325 MG tablet Take 1 tablet by mouth every 6 (six) hours as needed for moderate pain. (Patient not taking: Reported on 07/09/2022)     OXYGEN Inhale 2 L into the lungs as needed (SOB).     PARoxetine (PAXIL) 40 MG tablet Take 1 tablet (40 mg total) by mouth daily. 90 tablet 0   predniSONE (DELTASONE)  2.5 MG tablet Take 2.5 mg by mouth daily. (Patient not taking: Reported on 07/09/2022)     pregabalin (LYRICA) 75 MG capsule Take 1 capsule (75 mg total) by mouth 2 (two) times daily. (Patient not taking: Reported on 11/30/2020) 60 capsule 1   promethazine (PHENERGAN) 25 MG tablet TAKE ONE TABLET BY MOUTH EVERY 8 HOURS AS NEEDED FOR FOR NAUSEA AND VOMITING 30 tablet 0   SYRINGE-NEEDLE, DISP, 3 ML (B-D 3CC LUER-LOK SYR 25GX1") 25G X 1" 3 ML MISC USE TO ADMINSTER B12 INJECTIONS 1 TIME A WEEK FOR 4 WEEKS THEN ONCE MONTHLY FOR 5 MONTH 10 each 0   temazepam (RESTORIL) 30 MG capsule Take 1 capsule (30 mg total) by mouth at bedtime. 30 capsule 0   No current facility-administered medications for this visit.    Medication Side Effects: None  Allergies:  Allergies  Allergen Reactions   Auvelity [Dextromethorphan-Bupropion Er]    Lisinopril     COUGH    Trazodone And Nefazodone     Paresthesias, weakness   Trintellix [Vortioxetine]     Macrobid [Nitrofurantoin Monohyd Macro] Diarrhea    Diarrhea, GI upset   Sulfa Antibiotics Rash    Past Medical History:  Diagnosis Date   Allergy    Anxiety    Arthritis    KNEES,BACK,HIPS   Depression    Fibromyalgia    GERD (gastroesophageal reflux disease)    Hyperlipidemia    Hypertension    IBS (irritable bowel syndrome)    Interstitial lung disease (HCC)    Pulmonary embolism ([place]) 09/2016   provoked- s/p rotator cuff repair   Sleep apnea     Family History  Problem Relation Age of Onset   Diabetes Mother    Heart disease Mother    Anxiety disorder Mother    Depression Mother    Kidney disease Mother    Diabetes Father    Heart disease Father    Anxiety disorder Father    Depression Father    Breast cancer Maternal Aunt    Breast cancer Maternal Aunt    Diabetes Paternal Grandmother    ADD / ADHD Daughter    Cystic fibrosis Son    Alcohol abuse Son    Drug abuse Son    Anxiety disorder Son    Depression Son     Social History   Socioeconomic History   Marital status: Widowed    Spouse name: Not on file   Number of children: 2   Years of education: 12   Highest education level: High school graduate  Occupational History   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   Vaping Use   Vaping Use: Never used  Substance and Sexual Activity   Alcohol use: No    Alcohol/week: 0.0 standard drinks of alcohol   Drug use: No   Sexual activity: Not on file  Other Topics Concern   Not on file  Social History Narrative   Lives alone, widowed. Has one dtr. in [place]. [place] and one son in [place]   Social Determinants of Health   Financial Resource Strain: Low Risk  (06/26/2020)   Overall Financial Resource Strain (CARDIA)    Difficulty of Paying Living Expenses: Not hard at all  Food Insecurity: No  Food Insecurity (02/22/2020)   Hunger Vital Sign  Worried About Charity fundraiser in the Last Year: Never true    [place] in the Last Year: Never true  Transportation Needs: No Transportation Needs (02/22/2020)   PRAPARE - Hydrologist (Medical): No    Lack of Transportation (Non-Medical): No  Physical Activity: Insufficiently Active (06/26/2020)   Exercise Vital Sign    Days of Exercise per Week: 4 days    Minutes of Exercise per Session: 20 min  Stress: No Stress Concern Present (06/26/2020)   [place]    Feeling of Stress : Only a little  Social Connections: Socially Isolated (06/26/2020)   Social Connection and Isolation Panel [NHANES]    Frequency of Communication with Friends and Family: More than three times a week    Frequency of Social Gatherings with Friends and Family: Never    Attends Religious Services: Never    Marine scientist or Organizations: No    Attends Archivist Meetings: Never    Marital Status: Widowed  Intimate Partner Violence: Not At Risk (06/26/2020)   Humiliation, Afraid, Rape, and Kick questionnaire    Fear of Current or Ex-Partner: No    Emotionally Abused: No    Physically Abused: No    Sexually Abused: No    Past Medical History, Surgical history, Social history, and Family history were reviewed and updated as appropriate.   Please see review of systems for further details on the patient's review from today.   Objective:   Physical Exam:  There were no vitals taken for this visit.  Physical Exam Neurological:     Mental Status: She is alert and oriented to person, place, and time.     Cranial Nerves: No dysarthria.  Psychiatric:        Attention and Perception: Attention and perception normal.        Speech: Speech normal.        Behavior: Behavior is cooperative.        Thought Content: Thought content normal. Thought  content is not paranoid or delusional. Thought content does not include homicidal or suicidal ideation. Thought content does not include homicidal or suicidal plan.        Cognition and Memory: Cognition and memory normal.        Judgment: Judgment normal.     Comments: Insight intact Mood presents as less depressed and anxious     Lab Review:     Component Value Date/Time   NA 137 10/09/2020 1447   NA 138 07/14/2020 1020   K 4.0 10/09/2020 1447   CL 102 10/09/2020 1447   CO2 28 10/09/2020 1447   GLUCOSE 102 (H) 10/09/2020 1447   BUN 13 10/09/2020 1447   BUN 13 07/14/2020 1020   CREATININE 0.81 10/09/2020 1447   CALCIUM 9.5 10/09/2020 1447   PROT 7.1 10/09/2020 1447   ALBUMIN 4.5 10/09/2020 1447   AST 13 10/09/2020 1447   ALT 10 10/09/2020 1447   ALKPHOS 74 10/09/2020 1447   BILITOT 0.4 10/09/2020 1447   GFRNONAA 77 07/14/2020 1020   GFRAA 89 07/14/2020 1020       Component Value Date/Time   WBC 7.8 10/09/2020 1447   RBC 4.88 10/09/2020 1447   HGB 13.0 10/09/2020 1447   HCT 39.4 10/09/2020 1447   PLT 262.0 10/09/2020 1447   MCV 80.8 10/09/2020 1447   MCH 26.1 05/17/2020 1146   MCHC 33.0 10/09/2020 1447  RDW 14.1 10/09/2020 1447   LYMPHSABS 1.6 10/09/2020 1447   MONOABS 0.4 10/09/2020 1447   EOSABS 0.1 10/09/2020 1447   BASOSABS 0.1 10/09/2020 1447    No results found for: "POCLITH", "LITHIUM"   No results found for: "PHENYTOIN", "PHENOBARB", "VALPROATE", "CBMZ"   .res Assessment: Plan:    Pt seen for 30 minutes and time spent discussing her plan to transfer care to provider closer to her home and is covered by her insurance. She reports that she has tolerated recent dose reduction in Xanax from 4 tablets to 3 tablets daily. She requests refill of Xanax and Temazepam until she can see new psychiatric provider in the next 1-2 weeks.  Continue Paxil 40 mg po qd for depression and anxiety.  Recommend continuing psychotherapy with Beckey Downing, LCAS. Patient advised  to contact office with any questions or if she needs assistance before transition of care.   Sala was seen today for follow-up.  Diagnoses and all orders for this visit:  Mild episode of recurrent major depressive disorder (HCC)  Generalized anxiety disorder -     ALPRAZolam (XANAX) 1 MG tablet; Take 1 tablet (1 mg total) by mouth 3 (three) times daily.  Primary insomnia -     temazepam (RESTORIL) 30 MG capsule; Take 1 capsule (30 mg total) by mouth at bedtime.     Please see After Visit Summary for patient specific instructions.  No future appointments.  No orders of the defined types were placed in this encounter.     -------------------------------

## 2022-07-22 ENCOUNTER — Other Ambulatory Visit: Payer: Self-pay

## 2022-07-22 DIAGNOSIS — F411 Generalized anxiety disorder: Secondary | ICD-10-CM

## 2022-07-22 DIAGNOSIS — F330 Major depressive disorder, recurrent, mild: Secondary | ICD-10-CM

## 2022-07-22 MED ORDER — PAROXETINE HCL 40 MG PO TABS: 40.0000 mg | ORAL_TABLET | Freq: Every day | ORAL | 0 refills | Status: AC

## 2022-07-23 NOTE — Unmapped (Signed)
[facility] Refill Coordination Note    Specialty Medication(s) to be Shipped:   Inflammatory Disorders: mycophenolate    Other medication(s) to be shipped: No additional medications requested for fill at this time     [name], DOB: [birth year]  Phone: [phone] (home)       All above HIPAA information was verified with patient.     Was a Nurse, learning disability used for this call? No    Completed refill call assessment today to schedule patient's medication shipment from the [facility] [phone]).  All relevant notes have been reviewed.     Specialty medication(s) and dose(s) confirmed: Regimen is correct and unchanged.   Changes to medications: [name] reports no changes at this time.  Changes to insurance: No  New side effects reported not previously addressed with a pharmacist or physician: None reported  Questions for the pharmacist: No    Confirmed patient received a Conservation officer, historic buildings and a Surveyor, mining with first shipment. The patient will receive a drug information handout for each medication shipped and additional FDA Medication Guides as required.       DISEASE/MEDICATION-SPECIFIC INFORMATION        N/A    SPECIALTY MEDICATION ADHERENCE     Medication Adherence    Patient reported X missed doses in the last month: 0  Specialty Medication: mycophenolate 500 mg  Patient is on additional specialty medications: No  Patient is on more than two specialty medications: No  Any gaps in refill history greater than 2 weeks in the last 3 months: no  Demonstrates understanding of importance of adherence: yes  Informant: patient                          Were doses missed due to medication being on hold? No    Mycophenolate 500mg : Patient has 5 days of medication on hand    REFERRAL TO PHARMACIST     Referral to the pharmacist: Not needed      [facility]     Shipping address confirmed in Epic.     Delivery Scheduled: Yes, Expected medication delivery date: 11/10. Medication will be delivered via Next Day Courier to the prescription address in Epic WAM.    Olga Millers   [facility] Specialty Technician

## 2022-07-25 NOTE — Unmapped (Signed)
Amber Bush 's Mycophenolate shipment will be delayed as a result of the medication is too soon to refill until 07/27/2022.     I have reached out to the patient  at [phone] and communicated the delivery change. We will reschedule the medication for the delivery date that the patient agreed upon.  We have confirmed the delivery date as 07/29/2022, via same day courier.

## 2022-07-29 MED FILL — MYCOPHENOLATE MOFETIL 500 MG TABLET: ORAL | 30 days supply | Qty: 60 | Fill #3

## 2022-07-31 MED ORDER — PANTOPRAZOLE 40 MG TABLET,DELAYED RELEASE
ORAL_TABLET | Freq: Two times a day (BID) | ORAL | 3 refills | 90 days | Status: CP
Start: 2022-07-31 — End: 2023-07-31

## 2022-08-20 NOTE — Unmapped (Signed)
[facility] Refill Coordination Note    Specialty Medication(s) to be Shipped:   Inflammatory Disorders: mycophenolate    Other medication(s) to be shipped: No additional medications requested for fill at this time     Amber Bush, DOB: April 30, 1956  Phone: [phone] (home)       All above HIPAA information was verified with patient.     Was a Nurse, learning disability used for this call? No    Completed refill call assessment today to schedule patient's medication shipment from the [facility] [phone]).  All relevant notes have been reviewed.     Specialty medication(s) and dose(s) confirmed: Regimen is correct and unchanged.   Changes to medications: Amber Bush reports no changes at this time.  Changes to insurance: No  New side effects reported not previously addressed with a pharmacist or physician: None reported  Questions for the pharmacist: No    Confirmed patient received a Conservation officer, historic buildings and a Surveyor, mining with first shipment. The patient will receive a drug information handout for each medication shipped and additional FDA Medication Guides as required.       DISEASE/MEDICATION-SPECIFIC INFORMATION        N/A    SPECIALTY MEDICATION ADHERENCE     Medication Adherence    Patient reported X missed doses in the last month: 0  Specialty Medication: mycophenolate 500 mg  Patient is on additional specialty medications: No  Patient is on more than two specialty medications: No  Any gaps in refill history greater than 2 weeks in the last 3 months: no  Demonstrates understanding of importance of adherence: yes                          Were doses missed due to medication being on hold? No    Mycophenolate 500mg : Patient has 7 days of medication on hand    REFERRAL TO PHARMACIST     Referral to the pharmacist: Not needed      [facility]     Shipping address confirmed in Epic.     Delivery Scheduled: Yes, Expected medication delivery date: 08/22/22 .     Medication will be delivered via Next Day Courier to the prescription address in Epic WAM.    Amber Bush   [facility] Specialty Technician

## 2022-08-21 MED FILL — MYCOPHENOLATE MOFETIL 500 MG TABLET: ORAL | 30 days supply | Qty: 60 | Fill #4

## 2022-08-29 MED ORDER — PREDNISONE 5 MG TABLET
ORAL_TABLET | Freq: Every day | ORAL | 0 refills | 30 days | Status: CP
Start: 2022-08-29 — End: 2022-09-28

## 2022-09-03 NOTE — Unmapped (Signed)
Opened by accident

## 2022-09-04 MED ORDER — CODEINE 10 MG-GUAIFENESIN 100 MG/5 ML ORAL LIQUID
Freq: Three times a day (TID) | ORAL | 3 refills | 16 days | Status: CP | PRN
Start: 2022-09-04 — End: 2023-03-03

## 2022-09-04 NOTE — Unmapped (Addendum)
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     Was a Nurse, learning disability used for this call? No    Completed refill call assessment today to schedule patient's medication shipment from the [facility] [phone]).  All relevant notes have been reviewed.     Specialty medication(s) and dose(s) confirmed: Regimen is correct and unchanged.   Changes to medications: Amber Bush reports starting the following medications: buspar  Changes to insurance: No  New side effects reported not previously addressed with a pharmacist or physician: None reported  Questions for the pharmacist: No    Confirmed patient received a Conservation officer, historic buildings and a Surveyor, mining with first shipment. The patient will receive a drug information handout for each medication shipped and additional FDA Medication Guides as required.       DISEASE/MEDICATION-SPECIFIC INFORMATION        N/A    SPECIALTY MEDICATION ADHERENCE     Medication Adherence    Patient reported X missed doses in the last month: 0  Specialty Medication: mycophenolate  Patient is on additional specialty medications: No  Patient is on more than two specialty medications: No  Any gaps in refill history greater than 2 weeks in the last 3 months: no  Demonstrates understanding of importance of adherence: yes  Informant: patient  Reliability of informant: reliable  Provider-estimated medication adherence level: good  Patient is at risk for Non-Adherence: No  Reasons for non-adherence: no problems identified                  Confirmed plan for next specialty medication refill: delivery by pharmacy  Refills needed for supportive medications: not needed Refill Coordination    Has the Patients' Contact Information Changed: No  Is the Shipping Address Different: No         Were doses missed due to medication being on hold? No    mycophenolate 500 mg: 3 days of medicine on hand       REFERRAL TO PHARMACIST     Referral to the pharmacist: Not needed      [facility]     Shipping address confirmed in Epic.     Delivery Scheduled: Yes, Expected medication delivery date: 12/22.     Medication will be delivered via Same Day Courier to the prescription address in Epic WAM.    Amber Bush   [facility] Specialty Technician

## 2022-09-06 MED FILL — MYCOPHENOLATE MOFETIL 500 MG TABLET: ORAL | 30 days supply | Qty: 60 | Fill #5

## 2022-09-11 NOTE — Unmapped (Unsigned)
[facility] NEW PATIENT VISIT      Date of Encounter: 09/11/2022    Primary Cardiologist: Donnetta Simpers, MD   Primary Care Provider:  Royce Macadamia, MD  Referring Provider: Royce Macadamia      ASSESSMENT & PLAN:     Reason for visit: Amber Bush is a 66 y.o. female who is referred to establish care courtesy of Dr. Royce Macadamia.            No follow-ups on file.    PERTINENT STUDIES:   I have independently reviewed the following imaging studies:     CTA chest 09/11/2021:  No acute pulmonary embolism.  Mosaic pulmonary attenuation and bronchial wall thickening with mild groundglass opacities in the anterior upper lobes, similar since 12/12/2017 and may reflect small airway disease or chronic HP.    PVL Venous Duplex Lower Extremity Bilateral 07/2021:  There was no evidence of acute deep vein thrombosis on this bilateral lower extremity venous duplex exam.          CARDIOVASCULAR HISTORY:       Cardiac CATH 03/2021:  Conclusions:  No significant coronary artery disease  Mild pulmonary hypertension with mean PA pressure of 21 and concomitant wedge pressure of 10  PVR of 1.4 by Fick and 1.7 by thermal    INFERIOR VENA CAVA FILTER PLACEMENT   07/2021:      HISTORY OF PRESENT ILLNESS:     CHIEF COMPLAINT:  Cardiac evaluation    BRIEF HISTORY: Amber Bush (1956/08/27) is a 66 y.o. female with PMHx significant for chest pain, SOB, DVT who presents for evaluation of cardiac risks and to establish care.     Today, the patient presents reporting ***      The patient denies PND, orthopnea, or syncope. Patient also denies pain with walking, discoloration, ulceration, or rest pain of the lower extremities.     Smoking Status: ***.     PMFSHx:     The following portions of the patient's history were reviewed and updated as appropriate: past medical history, past surgical history, past family history, past social history, allergies, current medication.     Past Medical History:   Diagnosis Date Anxiety     Colon polyp     Depression     Diverticulitis of colon     DVT (deep vein thrombosis) in pregnancy     Fibrocystic breast     GERD (gastroesophageal reflux disease)     Hypertension     ILD (interstitial lung disease) (CMS-HCC)     Interstitial lung disease (CMS-HCC)     OSA (obstructive sleep apnea)     PE (pulmonary thromboembolism) (CMS-HCC)     Pulmonary embolism (CMS-HCC)     Sciatica     Sleep apnea          Past Surgical History:   Procedure Laterality Date    CHOLECYSTECTOMY      HYSTERECTOMY      partial    IC IVC FILTER PLACEMENT ([place] HISTORICAL RESULT)      ORIF ANKLE FRACTURE Left     PR CATH PLACE/CORON ANGIO, IMG SUPER/INTERP,R&L HRT CATH, L HRT VENTRIC N/A 04/02/2021    Procedure: Left/Right Heart Catheterization;  Surgeon: Neal Dy, MD;  Location: [facility] CATH;  Service: Cardiology    PR ESOPHAGEAL MOTILITY STUDY, MANOMETRY N/A 01/16/2017    Procedure: ESOPHAGEAL MOTILITY STUDY W/INT & REP;  Surgeon: Nurse-Based Giproc;  [facility];  Service: Gastroenterology  PR GERD TST W/ NASAL IMPEDENCE ELECTROD N/A 01/16/2017    Procedure: ESOPH FUNCT TST NASL ELEC PLCMT;  Surgeon: Nurse-Based Giproc;  [facility]: [facility];  Service: Gastroenterology    PR GERD TST W/ NASAL PH ELECTROD N/A 01/16/2017    Procedure: ESOPHAGEAL 24 HOUR PH MONITORING;  Surgeon: Nurse-Based Giproc;  [facility]: [facility];  Service: Gastroenterology    PR INS INTRVAS VC FILTR W/WO VAS ACS VSL SELXN RS&I Right 07/17/2021    Procedure: INFERIOR VENA CAVA FILTER PLACEMENT;  Surgeon: Ernie Hew, MD;  Location: [place] CATH;  Service: Cardiology    PR REMOVAL DEEP IMPLANT Left 07/20/2021    Procedure: REMOVAL OF LEFT ANKLE HARDWARE;  Surgeon: Arva Chafe, MD;  Location: OR [place];  Service: Orthopedics    PR RTRVL INTRVAS VC FILTR W/WO ACS VSL SELXN RS&I Right 02/26/2022    Procedure: INFERIOR VENA CAVA FILTER REMOVAL;  Surgeon: Ernie Hew, MD;  Location: [place] CATH;  Service: Cardiology    PR UP GI ENDOSCOPY,BALL DIL,30MM N/A 10/28/2017    Procedure: UGI ENDO; W/BALLOON DILAT ESOPHAGUS (<30MM DIAM);  Surgeon: Liane Comber, MD;  Location: [facility]ED[facility];  Service: Gastroenterology    PR UPPER GI ENDOSCOPY,BIOPSY Left 10/28/2017    Procedure: UGI ENDOSCOPY; WITH BIOPSY, SINGLE OR MULTIPLE;  Surgeon: Liane Comber, MD;  Location: [facility];  Service: Gastroenterology    ROTATOR CUFF REPAIR Right 07/2016           Family History   Problem Relation Age of Onset    Heart disease Mother     Hypertension Mother     Kidney disease Mother     Heart disease Father     Hypertension Father     Kidney disease Father            Social History     Socioeconomic History    Marital status: Widowed   Tobacco Use    Smoking status: Never    Smokeless tobacco: Never   Vaping Use    Vaping Use: Never used   Substance and Sexual Activity    Alcohol use: No     Alcohol/week: 0.0 standard drinks of alcohol    Drug use: No    Sexual activity: Not Currently         Current Allergy List  Allergies   Allergen Reactions    Trazodone Other (See Comments)     Paresthesias, weakness    Sulfa (Sulfonamide Antibiotics) Rash       Current Medication List  Current Outpatient Medications   Medication Sig Dispense Refill    ALPRAZolam (XANAX) 1 MG tablet Take 1 tablet (1 mg total) by mouth every morning. 1 tab in morning and 2 tabs at bedtime      amLODIPine (NORVASC) 5 MG tablet Take 0.5 tablets (2.5 mg total) by mouth every morning.      baclofen (LIORESAL) 10 MG tablet Take 1 tablet (10 mg total) by mouth nightly.      cetirizine (ZYRTEC) 10 MG tablet Take 1 tablet (10 mg total) by mouth every morning.      codeine-guaiFENesin (GUAIFENESIN AC) 10-100 mg/5 mL liquid Take 5 mL by mouth Three (3) times a day as needed for cough. 237 mL 3    fenofibrate micronized (LOFIBRA) 134 MG capsule Take 1 capsule (134 mg total) by mouth daily before breakfast. fluticasone (FLONASE) 50 mcg/actuation nasal spray 1 spray into each nostril every morning.  lamoTRIgine (LAMICTAL) 100 MG tablet Take 1 tablet (100 mg total) by mouth every morning.      lisinopril (PRINIVIL,ZESTRIL) 20 MG tablet Take 1 tablet (20 mg total) by mouth every morning.      mycophenolate (CELLCEPT) 500 mg tablet Take 1 tablet (500 mg total) by mouth Two (2) times a day. 60 tablet 11    pantoprazole (PROTONIX) 40 MG tablet Take 1 tablet (40 mg total) by mouth two (2) times a day. 180 tablet 3    PARoxetine (PAXIL) 30 MG tablet Take 1 tablet (30 mg total) by mouth every morning.      pravastatin (PRAVACHOL) 40 MG tablet Take 1 tablet (40 mg total) by mouth every morning.      predniSONE (DELTASONE) 2.5 MG tablet Take 1 tablet (2.5 mg total) by mouth every morning. 90 tablet 3    predniSONE (DELTASONE) 5 MG tablet Take 1 tablet (5 mg total) by mouth daily. 30 tablet 0    promethazine (PHENERGAN) 25 MG tablet Take 1 tablet (25 mg total) by mouth every six (6) hours as needed for nausea.      temAZEpam (RESTORIL) 30 mg capsule Take 1 capsule (30 mg total) by mouth nightly as needed for sleep.       No current facility-administered medications for this visit.         REVIEW OF SYSTEMS:     All positives and pertinent negatives were mentioned in the history of present illness.    LABORATORY RESULTS:     Lab Results   Component Value Date    WBC 5.5 07/17/2021    HGB 11.6 (L) 07/17/2021    HCT 34.5 (L) 07/17/2021    PLT 215 07/17/2021       Lab Results   Component Value Date    NA 138 07/20/2021    K 3.8 07/20/2021    CL 103 07/20/2021    CO2 26.0 07/20/2021    BUN 8 (L) 07/20/2021    CREATININE 0.72 07/20/2021    GLU 124 07/20/2021    CALCIUM 9.5 07/20/2021    MG 1.9 03/27/2021       Lab Results   Component Value Date    BILITOT 0.3 07/20/2021    BILIDIR 0.10 03/27/2021    PROT 6.7 07/20/2021    ALBUMIN 3.7 07/20/2021    ALT <7 (L) 07/20/2021    AST 13 07/20/2021    ALKPHOS 63 07/20/2021       Lab Results Component Value Date    PT 12.4 02/29/2020    INR 1.04 02/29/2020    APTT 30.7 02/29/2020       PHYSICAL EXAM:     There were no vitals filed for this visit.  There is no height or weight on file to calculate BMI.      Constitutional - No acute distress, well appearing and well nourished.   Neck - Supple, symmetric. Trachea midline.  No masses. No JVD.  Cardiovascular:  S1 and S2 present. RRR. PMI not displaced. No thrills, lifts, or heaves.  No jugular vein distension. No carotid bruit. DP/PT/Radials 2+ and equal bilaterally. No edema  Respiratory: Normal respiratory effort with symmetrical lung expansion. Clear.  Musculoskeletal - Muscle strength and tone normal. No atrophy or abnormal movements noted.  Extremities - Extremities are warm bilaterally. No wounds. Palpable femoral pulses.  DP/PT/Radials 2+ and equal bilaterally.     SCRIBE ATTESTATION:     This document serves as a record of the  services and decisions performed by Jeffie Pollock, MD on 09/11/2022. It was created on his behalf by Kandace Blitz, a trained medical scribe. The creation of this document is based on the provider's statements and observations that were conveyed to the medical scribe during the patient's encounter.    Jeffie Pollock, MD RPVI   Interventional Cardiology  [facility]

## 2022-09-24 NOTE — Unmapped (Signed)
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     Was a Nurse, learning disability used for this call? No    Completed refill call assessment today to schedule patient's medication shipment from the [facility] [phone]).  All relevant notes have been reviewed.     Specialty medication(s) and dose(s) confirmed: Regimen is correct and unchanged.   Changes to medications: Amber Bush reports no changes at this time.  Changes to insurance: No  New side effects reported not previously addressed with a pharmacist or physician: None reported  Questions for the pharmacist: No    Confirmed patient received a Conservation officer, historic buildings and a Surveyor, mining with first shipment. The patient will receive a drug information handout for each medication shipped and additional FDA Medication Guides as required.       DISEASE/MEDICATION-SPECIFIC INFORMATION        N/A    SPECIALTY MEDICATION ADHERENCE     Medication Adherence    Patient reported X missed doses in the last month: 0  Specialty Medication: mycophenolate 500 mg tablet (CELLCEPT)  Patient is on additional specialty medications: No                                Were doses missed due to medication being on hold? No    mycophenolate 500 mg: 8 days of medicine on hand       REFERRAL TO PHARMACIST     Referral to the pharmacist: Not needed      [facility]     Shipping address confirmed in Epic.     Delivery Scheduled: Yes, Expected medication delivery date: 09/27/22.     Medication will be delivered via Next Day Courier to the prescription address in Epic WAM.    Quintella Reichert   [facility] Specialty Technician

## 2022-09-26 MED FILL — MYCOPHENOLATE MOFETIL 500 MG TABLET: ORAL | 30 days supply | Qty: 60 | Fill #6

## 2022-10-11 NOTE — Unmapped (Signed)
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     Was a Nurse, learning disability used for this call? No    Completed refill call assessment today to schedule patient's medication shipment from the [facility] [phone]).  All relevant notes have been reviewed.     Specialty medication(s) and dose(s) confirmed: Regimen is correct and unchanged.   Changes to medications: [name] reports no changes at this time.  Changes to insurance: No  New side effects reported not previously addressed with a pharmacist or physician: None reported  Questions for the pharmacist: No    Confirmed patient received a Conservation officer, historic buildings and a Surveyor, mining with first shipment. The patient will receive a drug information handout for each medication shipped and additional FDA Medication Guides as required.       DISEASE/MEDICATION-SPECIFIC INFORMATION        N/A    SPECIALTY MEDICATION ADHERENCE     Medication Adherence    Patient reported X missed doses in the last month: 0  Specialty Medication: mycophenolate 500 mg tablet (CELLCEPT)  Patient is on additional specialty medications: No  Informant: patient                                Were doses missed due to medication being on hold? No    Mycophenolate 500 mg: 14 days of medicine on hand       REFERRAL TO PHARMACIST     Referral to the pharmacist: Not needed      [facility]     Shipping address confirmed in Epic.     Delivery Scheduled: Yes, Expected medication delivery date: 10/21/22.     Medication will be delivered via Same Day Courier to the prescription address in Epic WAM.    [name]   [facility] Specialty Technician

## 2022-10-21 MED FILL — MYCOPHENOLATE MOFETIL 500 MG TABLET: ORAL | 30 days supply | Qty: 60 | Fill #7

## 2022-11-12 NOTE — Unmapped (Signed)
[facility] Clinical Assessment & Refill Coordination Note    Amber Bush, DOB: July 18, 1956  Phone: [phone] (home)     All above HIPAA information was verified with patient.     Was a Nurse, learning disability used for this call? No    Specialty Medication(s):   Inflammatory Disorders: mycophenolate     Current Outpatient Medications   Medication Sig Dispense Refill    ALPRAZolam (XANAX) 1 MG tablet Take 1 tablet (1 mg total) by mouth every morning. 1 tab in morning and 2 tabs at bedtime      amLODIPine (NORVASC) 5 MG tablet Take 0.5 tablets (2.5 mg total) by mouth every morning.      baclofen (LIORESAL) 10 MG tablet Take 1 tablet (10 mg total) by mouth nightly.      cetirizine (ZYRTEC) 10 MG tablet Take 1 tablet (10 mg total) by mouth every morning.      codeine-guaiFENesin (GUAIFENESIN AC) 10-100 mg/5 mL liquid Take 5 mL by mouth Three (3) times a day as needed for cough. 237 mL 3    fenofibrate micronized (LOFIBRA) 134 MG capsule Take 1 capsule (134 mg total) by mouth daily before breakfast.      fluticasone (FLONASE) 50 mcg/actuation nasal spray 1 spray into each nostril every morning.      lamoTRIgine (LAMICTAL) 100 MG tablet Take 1 tablet (100 mg total) by mouth every morning.      lisinopril (PRINIVIL,ZESTRIL) 20 MG tablet Take 1 tablet (20 mg total) by mouth every morning.      mycophenolate (CELLCEPT) 500 mg tablet Take 1 tablet (500 mg total) by mouth Two (2) times a day. 60 tablet 11    pantoprazole (PROTONIX) 40 MG tablet Take 1 tablet (40 mg total) by mouth two (2) times a day. 180 tablet 3    PARoxetine (PAXIL) 30 MG tablet Take 1 tablet (30 mg total) by mouth every morning.      pravastatin (PRAVACHOL) 40 MG tablet Take 1 tablet (40 mg total) by mouth every morning.      predniSONE (DELTASONE) 2.5 MG tablet Take 1 tablet (2.5 mg total) by mouth every morning. 90 tablet 3    promethazine (PHENERGAN) 25 MG tablet Take 1 tablet (25 mg total) by mouth every six (6) hours as needed for nausea.      temAZEpam (RESTORIL) 30 mg capsule Take 1 capsule (30 mg total) by mouth nightly as needed for sleep.       No current facility-administered medications for this visit.        Changes to medications: Amber Bush reports no changes at this time.    Allergies   Allergen Reactions    Trazodone Other (See Comments)     Paresthesias, weakness    Sulfa (Sulfonamide Antibiotics) Rash       Changes to allergies: No    SPECIALTY MEDICATION ADHERENCE     mycophenolate 500 mg: 21 days of medicine on hand     Medication Adherence    Patient reported X missed doses in the last month: 0  Specialty Medication: mycophenolate 500 mg - 1 tab BID  Patient is on additional specialty medications: No  Patient is on more than two specialty medications: No  Any gaps in refill history greater than 2 weeks in the last 3 months: no  Demonstrates understanding of importance of adherence: yes  Informant: patient          Specialty medication(s) dose(s) confirmed: Regimen is correct and  unchanged.     Are there any concerns with adherence? No    Adherence counseling provided? Not needed    CLINICAL MANAGEMENT AND INTERVENTION      Clinical Benefit Assessment:    Do you feel the medicine is effective or helping your condition? Yes but still has constant cough     Clinical Benefit counseling provided? Not needed    Adverse Effects Assessment:    Are you experiencing any side effects? No    Are you experiencing difficulty administering your medicine? No    Quality of Life Assessment:    Quality of Life    Rheumatology  Oncology  Dermatology  Cystic Fibrosis          How many days over the past month did your ILD  keep you from your normal activities? For example, brushing your teeth or getting up in the morning. Amber Bush states she has a constant cough that sometimes worsens with weather changes but overall stable.    Have you discussed this with your provider? Not needed    Acute Infection Status:    Acute infections noted within Epic:  No active infections  Patient reported infection: None    Therapy Appropriateness:    Is therapy appropriate and patient progressing towards therapeutic goals? Yes, therapy is appropriate and should be continued    DISEASE/MEDICATION-SPECIFIC INFORMATION      N/A    Chronic Inflammatory Diseases: Have you experienced any flares in the last month? No    PATIENT SPECIFIC NEEDS     Does the patient have any physical, cognitive, or cultural barriers? No    Is the patient high risk? Yes, patient is taking a REMS drug. Medication is dispensed in compliance with REMS program    Did the patient require a clinical intervention? No    Does the patient require physician intervention or other additional services (i.e., nutrition, smoking cessation, social work)? No    SOCIAL DETERMINANTS OF HEALTH     At the [facility], we have learned that life circumstances - like trouble affording food, housing, utilities, or transportation can affect the health of many of our patients.   That is why we wanted to ask: are you currently experiencing any life circumstances that are negatively impacting your health and/or quality of life? No    Social Determinants of Health     Financial Resource Strain: Low Risk  (06/26/2020)    Received from [facility]    Overall Financial Resource Strain (CARDIA)     Difficulty of Paying Living Expenses: Not hard at all   Internet Connectivity: Not on file   Food Insecurity: No Food Insecurity (02/22/2020)    Received from [facility]    Hunger Vital Sign     Worried About Running Out of Food in the Last Year: Never true     Ran Out of Food in the Last Year: Never true   Tobacco Use: Medium Risk (04/08/2022)    Received from [facility]    Patient History     Smoking Tobacco Use: Former     Smokeless Tobacco Use: Never     Passive Exposure: Not on file   Housing/Utilities: Not on file   Alcohol Use: Not on file   Transportation Needs: No Transportation Needs (02/22/2020)    Received from [facility] - Transportation     Lack of Transportation (Medical): No     Lack of Transportation (Non-Medical): No  Substance Use: Not on file   Health Literacy: Not on file   Physical Activity: Insufficiently Active (06/26/2020)    Received from [facility]    Exercise Vital Sign     Days of Exercise per Week: 4 days     Minutes of Exercise per Session: 20 min   Interpersonal Safety: Not on file   Stress: No Stress Concern Present (06/26/2020)    Received from [facility] - Occupational Stress Questionnaire     Feeling of Stress : Only a little   Intimate Partner Violence: Not At Risk (06/26/2020)    Received from [facility]    Humiliation, Afraid, Rape, and Kick questionnaire     Fear of Current or Ex-Partner: No     Emotionally Abused: No     Physically Abused: No     Sexually Abused: No   Depression: Not on file   Social Connections: Socially Isolated (06/26/2020)    Received from [facility]    Social Connection and Isolation Panel [NHANES]     Frequency of Communication with Friends and Family: More than three times a week     Frequency of Social Gatherings with Friends and Family: Never     Attends Religious Services: Never     Database administrator or Organizations: No     Attends Banker Meetings: Never     Marital Status: Widowed       Would you be willing to receive help with any of the needs that you have identified today? Not applicable       SHIPPING     Specialty Medication(s) to be Shipped:   Inflammatory Disorders: mycophenolate    Other medication(s) to be shipped: No additional medications requested for fill at this time     Changes to insurance: No    Patient was informed of new phone menu: No    Delivery Scheduled: Yes, Expected medication delivery date: 11/19/22.     Medication will be delivered via Same Day Courier to the confirmed prescription address in [facility].    The patient will receive a drug information handout for each medication shipped and additional FDA Medication Guides as required.  Verified that patient has previously received a Conservation officer, historic buildings and a Surveyor, mining.    The patient or caregiver noted above participated in the development of this care plan and knows that they can request review of or adjustments to the care plan at any time.      All of the patient's questions and concerns have been addressed.    Oliva Bustard, PharmD   [facility] Specialty Pharmacist

## 2022-11-18 ENCOUNTER — Ambulatory Visit: Admit: 2022-11-18 | Discharge: 2022-11-18 | Disposition: A | Discharge status: Home / Self Care | Payer: MEDICARE

## 2022-11-18 ENCOUNTER — Emergency Department: Admit: 2022-11-18 | Discharge: 2022-11-18 | Disposition: A | Discharge status: Home / Self Care | Payer: MEDICARE

## 2022-11-18 MED ADMIN — Propofol (DIPRIVAN) injection 120 mg: 120 mg | INTRAVENOUS | @ 04:00:00 | Stop: 2022-11-17

## 2022-11-18 MED ADMIN — ondansetron (ZOFRAN) injection 4 mg: 4 mg | INTRAVENOUS | @ 03:00:00

## 2022-11-18 MED ADMIN — HYDROmorphone (PF) (DILAUDID) injection 1 mg: 1 mg | INTRAVENOUS | @ 03:00:00 | Stop: 2022-11-17

## 2022-11-18 NOTE — Unmapped (Signed)
Bed: 27  Expected date:   Expected time:   Means of arrival:   Comments:  EM[facility] - fall

## 2022-11-18 NOTE — Unmapped (Signed)
ED Procedure Note    Orthopedic Injury    Date/Time: 11/17/2022 11:27 PM    Performed by: Rozanna Box, MD  Authorized by: Rozanna Box, MD    Universal Protocol:     Verbal consent obtained?: No      Written consent obtained?: Yes      Risks and benefits: Risks, benefits and alternatives were discussed      Consent given by:  Patient    Patient states understanding of procedure being performed: Yes      Patient's understanding of procedure matches consent: Yes      Procedure consent matches procedure scheduled: Yes      Relevant documents present and verified: Yes      Test results available and properly labeled: Yes      Imaging studies available: Yes      Patient identity confirmed:  Verbally with patient and arm band    Time out: Immediately prior to the procedure a time out was called      Injury:     Injury location:  Shoulder    Location details:  Right shoulder    Injury type:  Dislocation    Dislocation type: anterior      Chronicity:  New    Hill-Sachs deformity?: No      Pre-procedure Assessment:     Distal perfusion: normal      Neurological function: diminished      Range of motion: reduced      Procedure Details:     Manipulation performed?: Yes      Reduction method:  Stimson maneuver    Reduction successful?: Yes      Confirmation: Reduction confirmed by x-ray      Immobilization:  Sling    Post-procedure Assessment:     Neurovascular status: Neurovascularly intact      Distal perfusion: normal      Neurological function: normal      Range of motion: normal      Patient tolerance:  Patient tolerated the procedure well with no immediate complications

## 2022-11-18 NOTE — Unmapped (Signed)
[place] eMERGENCY dEPARTMENT eNCOUnter      Room #27/27    CHIEF COMPLAINT    Right shoulder injury    HPI    This is a very pleasant 67 y.o. female with a history of rotator cuff repair surgery in 2017 and sciatica who presents to the ED complaining of right shoulder pain.     Patient states she tripped walking back from the bathroom. She states she does not think she tripped over anything and did not hit her head. She states that she had rotator cuff surgery several years ago to the right shoulder. She states she has been having sciatica but not different from baseline. She states the pain goes from the top of her arm to her elbow but that her fingers are tingly but able to move. She states she has interstitial lung disease for which she occasionally uses oxygen.     The following independent historian(s) was required: none  Outside medical records reviewed and pertinent findings: none  Tests ordered by a provider other than myself that I reviewed: none  This evaluation or treatment was affected by the following social factors:  none    PAST MEDICAL HISTORY    Past Medical History:   Diagnosis Date    Anxiety     Colon polyp     Depression     Diverticulitis of colon     DVT (deep vein thrombosis) in pregnancy     Fibrocystic breast     GERD (gastroesophageal reflux disease)     Hypertension     ILD (interstitial lung disease) (CMS-HCC)     Interstitial lung disease (CMS-HCC)     OSA (obstructive sleep apnea)     PE (pulmonary thromboembolism) (CMS-HCC)     Pulmonary embolism (CMS-HCC)     Sciatica     Sleep apnea        SURGICAL HISTORY    Past Surgical History:   Procedure Laterality Date    CHOLECYSTECTOMY      HYSTERECTOMY      partial    IC IVC FILTER PLACEMENT ([facility] HISTORICAL RESULT)      ORIF ANKLE FRACTURE Left     PR CATH PLACE/CORON ANGIO, IMG SUPER/INTERP,R&L HRT CATH, L HRT VENTRIC N/A 04/02/2021    Procedure: Left/Right Heart Catheterization;  Surgeon: Neal Dy, MD;  Location: [facility] CATH; Service: Cardiology    PR ESOPHAGEAL MOTILITY STUDY, MANOMETRY N/A 01/16/2017    Procedure: ESOPHAGEAL MOTILITY STUDY W/INT & REP;  Surgeon: Nurse-Based Giproc;  Location: [facility];  Service: Gastroenterology    PR GERD TST W/ NASAL IMPEDENCE ELECTROD N/A 01/16/2017    Procedure: ESOPH FUNCT TST NASL ELEC PLCMT;  Surgeon: Nurse-Based Giproc;  Location: [facility];  Service: Gastroenterology    PR GERD TST W/ NASAL PH ELECTROD N/A 01/16/2017    Procedure: ESOPHAGEAL 24 HOUR PH MONITORING;  Surgeon: Nurse-Based Giproc;  [facility]: [facility];  Service: Gastroenterology    PR INS INTRVAS VC FILTR W/WO VAS ACS VSL SELXN RS&I Right 07/17/2021    Procedure: INFERIOR VENA CAVA FILTER PLACEMENT;  Surgeon: Ernie Hew, MD;  Location: [place] CATH;  Service: Cardiology    PR REMOVAL DEEP IMPLANT Left 07/20/2021    Procedure: REMOVAL OF LEFT ANKLE HARDWARE;  Surgeon: Arva Chafe, MD;  Location: [facility] [place];  Service: Orthopedics    PR RTRVL INTRVAS VC FILTR W/WO ACS VSL SELXN RS&I Right 02/26/2022    Procedure: INFERIOR VENA  CAVA FILTER REMOVAL;  Surgeon: Ernie Hew, MD;  Location: [place] CATH;  Service: Cardiology    PR UP GI ENDOSCOPY,BALL DIL,30MM N/A 10/28/2017    Procedure: UGI ENDO; W/BALLOON DILAT ESOPHAGUS (<30MM DIAM);  Surgeon: Liane Comber, MD;  Location: [facility];  Service: Gastroenterology    PR UPPER GI ENDOSCOPY,BIOPSY Left 10/28/2017    Procedure: UGI ENDOSCOPY; WITH BIOPSY, SINGLE OR MULTIPLE;  Surgeon: Liane Comber, MD;  Location: [facility] PROC[facility];  Service: Gastroenterology    ROTATOR CUFF REPAIR Right 07/2016       CURRENT MEDICATIONS      Current Facility-Administered Medications:     ondansetron (ZOFRAN) injection 4 mg, 4 mg, Intravenous, Q6H PRN, Rozanna Box, MD, 4 mg at 11/17/22 2224    Current Outpatient Medications:     ALPRAZolam (XANAX) 1 MG tablet, Take 1 tablet (1 mg total) by mouth every morning. 1 tab in morning and 2 tabs at bedtime, Disp: , Rfl:     amLODIPine (NORVASC) 5 MG tablet, Take 0.5 tablets (2.5 mg total) by mouth every morning., Disp: , Rfl:     baclofen (LIORESAL) 10 MG tablet, Take 1 tablet (10 mg total) by mouth nightly., Disp: , Rfl:     cetirizine (ZYRTEC) 10 MG tablet, Take 1 tablet (10 mg total) by mouth every morning., Disp: , Rfl:     codeine-guaiFENesin (GUAIFENESIN AC) 10-100 mg/5 mL liquid, Take 5 mL by mouth Three (3) times a day as needed for cough., Disp: 237 mL, Rfl: 3    fenofibrate micronized (LOFIBRA) 134 MG capsule, Take 1 capsule (134 mg total) by mouth daily before breakfast., Disp: , Rfl:     fluticasone (FLONASE) 50 mcg/actuation nasal spray, 1 spray into each nostril every morning., Disp: , Rfl:     lamoTRIgine (LAMICTAL) 100 MG tablet, Take 1 tablet (100 mg total) by mouth every morning., Disp: , Rfl:     lisinopril (PRINIVIL,ZESTRIL) 20 MG tablet, Take 1 tablet (20 mg total) by mouth every morning., Disp: , Rfl:     mycophenolate (CELLCEPT) 500 mg tablet, Take 1 tablet (500 mg total) by mouth Two (2) times a day., Disp: 60 tablet, Rfl: 11    pantoprazole (PROTONIX) 40 MG tablet, Take 1 tablet (40 mg total) by mouth two (2) times a day., Disp: 180 tablet, Rfl: 3    PARoxetine (PAXIL) 30 MG tablet, Take 1 tablet (30 mg total) by mouth every morning., Disp: , Rfl:     pravastatin (PRAVACHOL) 40 MG tablet, Take 1 tablet (40 mg total) by mouth every morning., Disp: , Rfl:     predniSONE (DELTASONE) 2.5 MG tablet, Take 1 tablet (2.5 mg total) by mouth every morning., Disp: 90 tablet, Rfl: 3    promethazine (PHENERGAN) 25 MG tablet, Take 1 tablet (25 mg total) by mouth every six (6) hours as needed for nausea., Disp: , Rfl:     temAZEpam (RESTORIL) 30 mg capsule, Take 1 capsule (30 mg total) by mouth nightly as needed for sleep., Disp: , Rfl:     No current facility-administered medications on file prior to encounter.     Current Outpatient Medications on File Prior to Encounter   Medication Sig    ALPRAZolam (XANAX) 1 MG tablet Take 1 tablet (1 mg total) by mouth every morning. 1 tab in morning and 2 tabs at bedtime    amLODIPine (NORVASC) 5 MG tablet Take 0.5 tablets (2.5 mg total) by mouth every morning.  baclofen (LIORESAL) 10 MG tablet Take 1 tablet (10 mg total) by mouth nightly.    cetirizine (ZYRTEC) 10 MG tablet Take 1 tablet (10 mg total) by mouth every morning.    codeine-guaiFENesin (GUAIFENESIN AC) 10-100 mg/5 mL liquid Take 5 mL by mouth Three (3) times a day as needed for cough.    fenofibrate micronized (LOFIBRA) 134 MG capsule Take 1 capsule (134 mg total) by mouth daily before breakfast.    fluticasone (FLONASE) 50 mcg/actuation nasal spray 1 spray into each nostril every morning.    lamoTRIgine (LAMICTAL) 100 MG tablet Take 1 tablet (100 mg total) by mouth every morning.    lisinopril (PRINIVIL,ZESTRIL) 20 MG tablet Take 1 tablet (20 mg total) by mouth every morning.    mycophenolate (CELLCEPT) 500 mg tablet Take 1 tablet (500 mg total) by mouth Two (2) times a day.    pantoprazole (PROTONIX) 40 MG tablet Take 1 tablet (40 mg total) by mouth two (2) times a day.    PARoxetine (PAXIL) 30 MG tablet Take 1 tablet (30 mg total) by mouth every morning.    pravastatin (PRAVACHOL) 40 MG tablet Take 1 tablet (40 mg total) by mouth every morning.    predniSONE (DELTASONE) 2.5 MG tablet Take 1 tablet (2.5 mg total) by mouth every morning.    promethazine (PHENERGAN) 25 MG tablet Take 1 tablet (25 mg total) by mouth every six (6) hours as needed for nausea.    temAZEpam (RESTORIL) 30 mg capsule Take 1 capsule (30 mg total) by mouth nightly as needed for sleep.       ALLERGIES    Allergies   Allergen Reactions    Trazodone Other (See Comments)     Paresthesias, weakness    Sulfa (Sulfonamide Antibiotics) Rash       FAMILY HISTORY    Family History   Problem Relation Age of Onset    Heart disease Mother     Hypertension Mother     Kidney disease Mother     Heart disease Father     Hypertension Father     Kidney disease Father        SOCIAL HISTORY    Social History     Tobacco Use    Smoking status: Never    Smokeless tobacco: Never   Vaping Use    Vaping status: Never Used   Substance Use Topics    Alcohol use: No     Alcohol/week: 0.0 standard drinks of alcohol    Drug use: No          REVIEW OF SYSTEMS    All other systems reviewed and are negative except as above.    PHYSICAL EXAM     VITAL SIGNS: BP 129/72  - Pulse 85  - Temp 36.8 ??C (98.2 ??F) (Oral)  - Resp 23  - Ht 162.6 cm (5' 4)  - Wt (!) 118 kg (260 lb 2.3 oz)  - SpO2 98%  - BMI 44.65 kg/m??   General: Well developed, well nourished patient in no acute distress.   HENT: Head is atraumatic, normocephalic. Oropharynx grossly normal.  Eyes: Normal appearance,visual acuity grossly normal, EOMI.  Neck: Supple, normal ROM, trachea midline, no stridor.  Heart: Regular rhythm, normal heart sounds S1 plus S2, no murmurs rubs or gallops.  Lungs: CTAB, normal work of breathing.  Abdomen: Soft, non-tender. Non-distended, no masses. No pulsatile aortic mass or epigastric bruit. Normal BS.   Back: No masses or obvious deformity. No CVA  tenderness. No bony tenderness.  Extremities: There is deformity to right shoulder with localized pain, tingling in right fingers and strength in hand in normal. All peripheral pulses symmetrical and equal; no pulse disparity to palpation. No significant pedal edema noted.  Skin: Warm, dry.   Neuro: Pt is A&OX3. Non-focal, no discernible acute weakness. Swallowing at baseline.  Psych: Mental status and affect normal. Patient interacts normally with myself and my team.     Pertinent Labs & Imaging studies were reviewed by me contemporaneously.     RADIOLOGY    XR Shoulder 2 Views Right   Final Result      1. Successful reduction of anterior dislocation. No visible acute fracture.   2. Decreased acromiohumeral interval suggestive of chronic rotator cuff injury.            Signed (Electronic Signature): 11/17/2022 11:59 PM    Signed By: Pollie Friar, MD      XR Shoulder 2 Views Right   Final Result   Anterior shoulder dislocation. No visible acute fracture.            Signed (Electronic Signature): 11/17/2022 10:24 PM    Signed By: Pollie Friar, MD          I independently interpreted the XR Shoulder and found it has anterior shoulder dislocation.     Medications given in the ED:  Medications   ondansetron [facility]) injection 4 mg (4 mg Intravenous Given 11/17/22 2224)   HYDROmorphone (PF) (DILAUDID) injection 1 mg (1 mg Intravenous Given 11/17/22 2151)   Propofol (DIPRIVAN) injection 120 mg (60 mg Intravenous Given 11/17/22 2318)       ED course:  9:43 PM: Patient initially assessed.   11:16 PM: Shoulder reduction performed, as detailed in separate procedure note       DIFFERENTIAL DIAGNOSIS AND/OR MEDICAL DECISION MAKING   Patient was placed in the bed as numbered, and seen by me as promptly as was possible. It is my usual practice to consult prior records if available in a timely fashion.    This is a 67 y.o. female  who presents for evaluation of shoulder pain in her right shoulder following fall. On initial exam patient's vital signs are stable. Patient's physical exam was notable for deformity of right shoulder.  Differential diagnosis included but not limited to: Clinical considerations include but are not limited to musculoskeletal pain, strain, occult fracture, sciatica, DJD, UTI, and pyelonephritis.  Based on the above, diagnostic testing was ordered. Xray shoulder confirmed anterior shoulder dislocation.     Based on all of the above, I performed a right shoulder reduction that was successful. Patient felt pain was improved from off the charts to a 5/10. Xray confirmed the reduction was successful.  With all of the above in mind, we will plan to: discharge, she will follow-up with orthopedics and will stay in a shoulder immobilizer and ice frequently until follow-up.    FINAL IMPRESSION    1. Anterior dislocation of right shoulder, initial encounter        FOLLOW-UP  [facility]  [address]  [place] [address]  [phone]    Schedule an appointment as soon as possible for a visit in 2 days  For follow up      DISCHARGE MEDICATIONS  New Prescriptions    No medications on file       I have considered a wide range of diagnoses as potential causes for the patient's symptoms.  The work-up has been  geared toward evaluating for those that were not felt to be safely excluded by history and exam.  After reviewing the encounter, it is felt that no evidence for a serious, life threatening, or time sensitive diagnosis exists.  Nevertheless, I emphasized the importance of close follow-up with their regular providers.  A list of symptoms to watch for and reasons to return have been discussed in my usual and customary fashion.  I have discussed with the patient/patient's family the nature of the ED work up and the goals of the evaluation and treatment.  I have explained the risks and benefits of various options for disposition.  At this time, the patient/patient's family is agreeable to outpatient management.  The patient/patient's family was counseled in my usual and customary fashion regarding risks and benefits of discharge, importance of follow-up, and home care for the presenting complaint.  The patient/family is able to repeat back and understand the discussion.  We have also reviewed any abnormal findings from the evaluation in the ED, including abnormal vital signs (blood pressure included) and follow-up has been recommended with the patient's primary care provider/referral physician. The patient has been made aware that incidental findings on testing in the ED may be present and that their primary care physician and/or the referral physician will need to review all testing done in the ED to determine if further investigation or follow-up is needed for these findings which may be unrelated to their reason for visit today.        Sheryle Spray Moorer was evaluated in the Emergency Department on November 17, 2022 9:43 PM for the symptoms described in the history of present illness. Patient was evaluated in the context of the global COVID-19 pandemic, which necessitated consideration that the patient might be at risk for infection with the SARS-CoV-2 virus that causes COVID-19. Institutional protocols and algorithms that pertain to the evaluation of patients at risk for COVID-19 are in a state of rapid change based on information released by regulatory bodies including the CDC and federal and state organizations. These policies and algorithms were followed during the patient's care in the ED.    The chart was initially created by Macky Lower using scribe services. The chart was reviewed, amended with Animal nutritionist, and signed by Ramond Craver, MD.       Rozanna Box, MD  11/18/22 [phone]

## 2022-11-18 NOTE — Unmapped (Signed)
Pt given DC instructions and explanation of services provided while in the dept. Pt verbalized understanding and expressed no questions or concerns. Pt wheeled out of dept with family without complication. Pt in NAD upon departure.

## 2022-11-18 NOTE — Unmapped (Signed)
EMERGENCY DEPARTMEMT PHYSICIAN PRE-SEDATION ASSESSMENT AND SEDATION DOCUMENTATION    Patient Name: Amber Bush    Patient DOB: [birth year]    Patient MRN:  [id number]    Indication for sedation: Shoulder Dislocation      PRE-SEDATION EVALUATION    NPO STATUS   Greater than 2 hours. If patient is not NPO,  a discussion of potential risks/benefits/alternatives of sedation was had with patient taking into account co-morbid conditions, previous adverse reactions to medications/sedation, cardiovascular status, potential risk of aspiration or airway compromise, etc..    ASA PHYSICAL STATUS CLASSIFICATION (I-VE)  3      HISTORY OF ADVERSE REACTION TO SEDATION OR ANESTHESIA  None    PAST MEDICAL HISTORY    Past Medical History:   Diagnosis Date    Anxiety     Colon polyp     Depression     Diverticulitis of colon     DVT (deep vein thrombosis) in pregnancy     Fibrocystic breast     GERD (gastroesophageal reflux disease)     Hypertension     ILD (interstitial lung disease) (CMS-HCC)     Interstitial lung disease (CMS-HCC)     OSA (obstructive sleep apnea)     PE (pulmonary thromboembolism) (CMS-HCC)     Pulmonary embolism (CMS-HCC)     Sciatica     Sleep apnea        SURGICAL HISTORY    Past Surgical History:   Procedure Laterality Date    CHOLECYSTECTOMY      HYSTERECTOMY      partial    IC IVC FILTER PLACEMENT ([place] HISTORICAL RESULT)      ORIF ANKLE FRACTURE Left     PR CATH PLACE/CORON ANGIO, IMG SUPER/INTERP,R&L HRT CATH, L HRT VENTRIC N/A 04/02/2021    Procedure: Left/Right Heart Catheterization;  Surgeon: Neal Dy, MD;  Location: [facility] CATH;  Service: Cardiology    PR ESOPHAGEAL MOTILITY STUDY, MANOMETRY N/A 01/16/2017    Procedure: ESOPHAGEAL MOTILITY STUDY W/INT & REP;  Surgeon: Nurse-Based Giproc;  [facility]: [facility];  Service: Gastroenterology    PR GERD TST W/ NASAL IMPEDENCE ELECTROD N/A 01/16/2017    Procedure: ESOPH FUNCT TST NASL ELEC PLCMT;  Surgeon: Nurse-Based Giproc; Location: [facility];  Service: Gastroenterology    PR GERD TST W/ NASAL PH ELECTROD N/A 01/16/2017    Procedure: ESOPHAGEAL 24 HOUR PH MONITORING;  Surgeon: Nurse-Based Giproc;  [facility]: [facility];  Service: Gastroenterology    PR INS INTRVAS VC FILTR W/WO VAS ACS VSL SELXN RS&I Right 07/17/2021    Procedure: INFERIOR VENA CAVA FILTER PLACEMENT;  Surgeon: Ernie Hew, MD;  Location: [place] CATH;  Service: Cardiology    PR REMOVAL DEEP IMPLANT Left 07/20/2021    Procedure: REMOVAL OF LEFT ANKLE HARDWARE;  Surgeon: Arva Chafe, MD;  Location: [place];  Service: Orthopedics    PR RTRVL INTRVAS VC FILTR W/WO ACS VSL SELXN RS&I Right 02/26/2022    Procedure: INFERIOR VENA CAVA FILTER REMOVAL;  Surgeon: Ernie Hew, MD;  Location: [place] CATH;  Service: Cardiology    PR UP GI ENDOSCOPY,BALL DIL,30MM N/A 10/28/2017    Procedure: UGI ENDO; W/BALLOON DILAT ESOPHAGUS (<30MM DIAM);  Surgeon: Liane Comber, MD;  Location: [facility] PROCED[facility];  Service: Gastroenterology    PR UPPER GI ENDOSCOPY,BIOPSY Left 10/28/2017    Procedure: UGI ENDOSCOPY; WITH BIOPSY, SINGLE OR MULTIPLE;  Surgeon: Liane Comber, MD;  Location: [facility] GI  PROCEDURES [facility];  Service: Gastroenterology    ROTATOR CUFF REPAIR Right 07/2016       CURRENT MEDICATIONS      Current Facility-Administered Medications:     ondansetron (ZOFRAN) injection 4 mg, 4 mg, Intravenous, Q6H PRN, Thornton Dales, Jillyn Hidden, MD, 4 mg at 11/17/22 2224    Propofol (DIPRIVAN) injection 120 mg, 120 mg, Intravenous, Once, Donoghue, Jillyn Hidden, MD    Current Outpatient Medications:     ALPRAZolam (XANAX) 1 MG tablet, Take 1 tablet (1 mg total) by mouth every morning. 1 tab in morning and 2 tabs at bedtime, Disp: , Rfl:     amLODIPine (NORVASC) 5 MG tablet, Take 0.5 tablets (2.5 mg total) by mouth every morning., Disp: , Rfl:     baclofen (LIORESAL) 10 MG tablet, Take 1 tablet (10 mg total) by mouth nightly., Disp: , Rfl: cetirizine (ZYRTEC) 10 MG tablet, Take 1 tablet (10 mg total) by mouth every morning., Disp: , Rfl:     codeine-guaiFENesin (GUAIFENESIN AC) 10-100 mg/5 mL liquid, Take 5 mL by mouth Three (3) times a day as needed for cough., Disp: 237 mL, Rfl: 3    fenofibrate micronized (LOFIBRA) 134 MG capsule, Take 1 capsule (134 mg total) by mouth daily before breakfast., Disp: , Rfl:     fluticasone (FLONASE) 50 mcg/actuation nasal spray, 1 spray into each nostril every morning., Disp: , Rfl:     lamoTRIgine (LAMICTAL) 100 MG tablet, Take 1 tablet (100 mg total) by mouth every morning., Disp: , Rfl:     lisinopril (PRINIVIL,ZESTRIL) 20 MG tablet, Take 1 tablet (20 mg total) by mouth every morning., Disp: , Rfl:     mycophenolate (CELLCEPT) 500 mg tablet, Take 1 tablet (500 mg total) by mouth Two (2) times a day., Disp: 60 tablet, Rfl: 11    pantoprazole (PROTONIX) 40 MG tablet, Take 1 tablet (40 mg total) by mouth two (2) times a day., Disp: 180 tablet, Rfl: 3    PARoxetine (PAXIL) 30 MG tablet, Take 1 tablet (30 mg total) by mouth every morning., Disp: , Rfl:     pravastatin (PRAVACHOL) 40 MG tablet, Take 1 tablet (40 mg total) by mouth every morning., Disp: , Rfl:     predniSONE (DELTASONE) 2.5 MG tablet, Take 1 tablet (2.5 mg total) by mouth every morning., Disp: 90 tablet, Rfl: 3    promethazine (PHENERGAN) 25 MG tablet, Take 1 tablet (25 mg total) by mouth every six (6) hours as needed for nausea., Disp: , Rfl:     temAZEpam (RESTORIL) 30 mg capsule, Take 1 capsule (30 mg total) by mouth nightly as needed for sleep., Disp: , Rfl:     ALLERGIES    Allergies   Allergen Reactions    Trazodone Other (See Comments)     Paresthesias, weakness    Sulfa (Sulfonamide Antibiotics) Rash       SOCIAL HISTORY     Social History     Socioeconomic History    Marital status: Widowed   Tobacco Use    Smoking status: Never    Smokeless tobacco: Never   Vaping Use    Vaping status: Never Used   Substance and Sexual Activity    Alcohol use: No     Alcohol/week: 0.0 standard drinks of alcohol    Drug use: No    Sexual activity: Not Currently     Social Determinants of Health     Financial Resource Strain: Low Risk  (06/26/2020)    Received from  [facility]    Overall Financial Resource Strain (CARDIA)     Difficulty of Paying Living Expenses: Not hard at all   Food Insecurity: No Food Insecurity (02/22/2020)    Received from [facility]    Hunger Vital Sign     Worried About Running Out of Food in the Last Year: Never true     Ran Out of Food in the Last Year: Never true   Transportation Needs: No Transportation Needs (02/22/2020)    Received from [facility] - Transportation     Lack of Transportation (Medical): No     Lack of Transportation (Non-Medical): No   Physical Activity: Insufficiently Active (06/26/2020)    Received from [facility]    Exercise Vital Sign     Days of Exercise per Week: 4 days     Minutes of Exercise per Session: 20 min   Stress: No Stress Concern Present (06/26/2020)    Received from [facility] - Occupational Stress Questionnaire     Feeling of Stress : Only a little   Social Connections: Socially Isolated (06/26/2020)    Received from [facility]    Social Connection and Isolation Panel [NHANES]     Frequency of Communication with Friends and Family: More than three times a week     Frequency of Social Gatherings with Friends and Family: Never     Attends Religious Services: Never     Database administrator or Organizations: No     Attends Banker Meetings: Never     Marital Status: Widowed         PHYSICAL EXAM  Vital Signs:  height is 162.6 cm (5' 4) and weight is 118 kg (260 lb 2.3 oz) (abnormal). Her oral temperature is 36.8 ??C (98.2 ??F). Her blood pressure is 129/72 and her pulse is 85. Her respiration is 23 and oxygen saturation is 98%.   Airway Exam:Mallampati:2, TM distance: >3 FB, jaw protrusion: normal, Neck ROM: full, mouth opening: normal  Pulmonary Exam: Lungs CTAB.  Cardiac Exam: RRR, normal S1, S2, no MRG.  Neuro: Pt is A+OX3  Other relevant exam:     ANCILLARY:  Recent Labwork: Labs Reviewed - No data to display  EKG rhythm strip: Sinus             SEDATION PROCEDURE      Patient Identity Confirmed   Verbally with patient.    Consent  Explnation of risks, benefits, and alternatives to sedation provided to patient.  Written consent completed for procedure.    Target level of sedation  Deep    Medication(s) used:  Propofol    Sedation Start time:   23:15    Sedation End Time:   23:25    Total Intra-service time of sedation   10 minutes    Level of sedation archived  Deep    A second provider was required for this procedure to be performed safely given the depth of sedation required and to meet hospital policy requirements.      Options and risks of sedation discussed with patient/family and Informed consent for procedure and sedation obtained.  Patient was connected to appropriate monitoring equipment per policy guidelines.  Verification was made that appropriate personnel and equipment were available in the room or nearby the room per protocol.  Patient was monitoring per policy/protocol, Recovery per policy/protocol.  Patient tolerated procedure well without any immediate complications  evident.      Please see below for vital signs during ED stay including during the sedation:  Patient Vitals for the past 24 hrs:   BP Temp Temp src Pulse Resp SpO2 Height Weight   11/17/22 2327 -- -- -- -- -- 98 % -- --   11/17/22 2325 129/72 -- -- 85 23 97 % -- --   11/17/22 2320 -- -- -- -- -- 95 % -- --   11/17/22 2320 152/92 -- -- 84 -- 95 % -- --   11/17/22 2315 139/84 -- -- 89 (!) 36 96 % -- --   11/17/22 2308 137/75 -- -- 90 -- 94 % -- --   11/17/22 2200 156/82 -- -- 87 -- 93 % -- --   11/17/22 2114 153/82 36.8 ??C (98.2 ??F) Oral 85 24 96 % 162.6 cm (5' 4) (!) 118 kg (260 lb 2.3 oz)

## 2022-11-18 NOTE — Unmapped (Signed)
Pt arrives via ems 12 from home after a fall in the bathroom. Pt denies Loc or hitting her head. Denies thinners. + right shoulder dislocation per EMS. Fentanyl and Zofran given PTA.

## 2022-11-19 MED FILL — MYCOPHENOLATE MOFETIL 500 MG TABLET: ORAL | 30 days supply | Qty: 60 | Fill #8

## 2022-12-11 ENCOUNTER — Telehealth: Payer: Self-pay | Admitting: Internal Medicine

## 2022-12-11 NOTE — Telephone Encounter (Signed)
I looked thru the pt's chart and I advised her that it appears she has been on Pravastatin since 2017 at least and I notified the pt .

## 2022-12-11 NOTE — Telephone Encounter (Signed)
Caller name: Glynn Laatsch  On DPR?: Yes  Call back number: [phone] (home)  Provider they see: Jerrell Belfast  Reason for call: Patient use to see Dr.Tabori before she moved to [facility]. Patient wants to know how long she has been taking a medication pravastatin (PRAVACHOL) 40 MG tablet.

## 2022-12-16 NOTE — Unmapped (Signed)
[facility] Refill Coordination Note    Specialty Medication(s) to be Shipped:   General Specialty: mycophenolate 500 mg    Other medication(s) to be shipped: No additional medications requested for fill at this time     Amber Bush, DOB: May 26, 1956  Phone: [phone] (home)       All above HIPAA information was verified with patient.     Was a Nurse, learning disability used for this call? No    Completed refill call assessment today to schedule patient's medication shipment from the [facility] [phone]).  All relevant notes have been reviewed.     Specialty medication(s) and dose(s) confirmed: Regimen is correct and unchanged.   Changes to medications: Amber Bush reports no changes at this time.  Changes to insurance: No  New side effects reported not previously addressed with a pharmacist or physician: None reported  Questions for the pharmacist: No    Confirmed patient received a Conservation officer, historic buildings and a Surveyor, mining with first shipment. The patient will receive a drug information handout for each medication shipped and additional FDA Medication Guides as required.       DISEASE/MEDICATION-SPECIFIC INFORMATION        N/A    SPECIALTY MEDICATION ADHERENCE     Medication Adherence    Patient reported X missed doses in the last month: 0  Specialty Medication: mycophenolate 500 mg tablet (CELLCEPT)  Patient is on additional specialty medications: No  Patient is on more than two specialty medications: No  Any gaps in refill history greater than 2 weeks in the last 3 months: no  Demonstrates understanding of importance of adherence: yes  Informant: patient  Provider-estimated medication adherence level: good  Patient is at risk for Non-Adherence: No  Reasons for non-adherence: no problems identified              Were doses missed due to medication being on hold? No    mycophenolate 500 mg tablet (CELLCEPT)  : 7 days of medicine on hand       REFERRAL TO PHARMACIST Referral to the pharmacist: Not needed      [facility]     Shipping address confirmed in Epic.     Patient was notified of new phone menu : No    Delivery Scheduled: Yes, Expected medication delivery date: 12/20/22.     Medication will be delivered via Same Day Courier to the prescription address in Epic WAM.    Amber Bush' W Amber Bush Shared [facility] Specialty Technician

## 2022-12-18 ENCOUNTER — Ambulatory Visit: Admit: 2022-12-18 | Discharge: 2022-12-19 | Payer: MEDICARE

## 2022-12-18 DIAGNOSIS — R0602 Shortness of breath: Principal | ICD-10-CM

## 2022-12-18 NOTE — Unmapped (Signed)
[facility] NEW PATIENT VISIT      Date of Encounter: 12/18/2022    Primary Cardiologist: Donnetta Simpers, MD   Primary Care Provider:  Royce Macadamia, MD  Referring Provider: No Ref provider       ASSESSMENT & PLAN:     Reason for visit: Amber Bush is a 67 y.o. female who is referred to establish care courtesy of Dr. Royce Macadamia, MD    #. 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 Managed with cortozone. Since the last couple of years, she reported an increase in prevalance with SOB.   Last couple of weeks, she reported being out of breath even with adequate SPO2 levels.   She discontinued paravastatin 12/11/2022. Eversince, she is able to easily reach and use her O2.   - CTA chest 02/28/2022 revealed No acute pulmonary embolisms are identified.   Plan: We will consider Zetia next OV. Stay off pravastatin. For now we will continue to monitor.     #. HLD    #. Morbid Obesity: unspecified   - BMI currently at 44.65 Kg/m^2  - Advised the patient to  efforts on weight loss through calorie reduction and regular exercise   - Goal of 10% weight loss in next 3-6 months  - Encouraged pt to establish walking routine      No follow-ups on file.    PERTINENT STUDIES:   I have independently reviewed the following imaging studies:     EKG 12/18/2022: NSR     PVL BLE duplex 01/28/2022: There was no evidence of acute deep vein thrombosis on this bilateral  lower extremity venous duplex exam.      CTA chest 02/28/2022: No acute pulmonary embolisms are identified.     CARDIOVASCULAR HISTORY:       Cath 02/26/2022: IVC filter removal.     Cath 07/17/2021: IVC filter placement.     Cath 04/02/2021: No significant CAD    HISTORY OF PRESENT ILLNESS:     CHIEF COMPLAINT:  Establish care    BRIEF HISTORY: Amber Bush (October 04, 1955) is a 67 y.o. female with PMHx significant for DVT, PE, SOB, CP, and morbid obesity who presents to establish care.     Pt reported that she currenlty has ILD left lower lung. Managed with cortozone. Since the last couple of years, she reported an increase in prevalance with SOB.   Last couple of weeks, she reported being out of breath even with adequate SPO2 levels.   She discontinued paravastatin 12/11/2022. Eversince, she is able to easily reach and use her O2.   Her PCP reffered her for cardiology for consultation on discontinuing paravastatin.   Endorses paternal and maternal susceptibility for DM and CAD.   Denies any previous Dx for blockages or past stenting.       Plan: We will consider Zetia next OV and stay off pravastatin. For now we will continue to monitor.     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   Note: Lost her Husband, son and daughter since 2010.     Smoking Status: N/A.     PMFSHx:     The following portions of the patient's history were reviewed and updated as appropriate: past medical history, past surgical history, past family history, past social history, allergies, current medication.     Past Medical History:   Diagnosis Date    Anxiety     Colon polyp  Depression     Diverticulitis of colon     DVT (deep vein thrombosis) in pregnancy     Fibrocystic breast     GERD (gastroesophageal reflux disease)     Hypertension     ILD (interstitial lung disease) (CMS-HCC)     Interstitial lung disease (CMS-HCC)     OSA (obstructive sleep apnea)     PE (pulmonary thromboembolism) (CMS-HCC)     Pulmonary embolism (CMS-HCC)     Sciatica     Sleep apnea          Past Surgical History:   Procedure Laterality Date    CHOLECYSTECTOMY      HYSTERECTOMY      partial    IC IVC FILTER PLACEMENT ([place] HISTORICAL RESULT)      ORIF ANKLE FRACTURE Left     PR CATH PLACE/CORON ANGIO, IMG SUPER/INTERP,R&L HRT CATH, L HRT VENTRIC N/A 04/02/2021    Procedure: Left/Right Heart Catheterization;  Surgeon: Neal Dy, MD;  Location: [facility] CATH;  Service: Cardiology    PR ESOPHAGEAL MOTILITY STUDY, MANOMETRY N/A 01/16/2017    Procedure: ESOPHAGEAL MOTILITY STUDY W/INT & REP;  Surgeon: Nurse-Based Giproc;  Location: [facility];  Service: Gastroenterology    PR GERD TST W/ NASAL IMPEDENCE ELECTROD N/A 01/16/2017    Procedure: ESOPH FUNCT TST NASL ELEC PLCMT;  Surgeon: Nurse-Based Giproc;  Location: GI [facility];  Service: Gastroenterology    PR GERD TST W/ NASAL PH ELECTROD N/A 01/16/2017    Procedure: ESOPHAGEAL 24 HOUR PH MONITORING;  Surgeon: Nurse-Based Giproc;  Location: [facility];  Service: Gastroenterology    PR INS INTRVAS VC FILTR W/WO VAS ACS VSL SELXN RS&I Right 07/17/2021    Procedure: INFERIOR VENA CAVA FILTER PLACEMENT;  Surgeon: Ernie Hew, MD;  Location: [place] CATH;  Service: Cardiology    PR REMOVAL DEEP IMPLANT Left 07/20/2021    Procedure: REMOVAL OF LEFT ANKLE HARDWARE;  Surgeon: Arva Chafe, MD;  Location: OR [place];  Service: Orthopedics    PR RTRVL INTRVAS VC FILTR W/WO ACS VSL SELXN RS&I Right 02/26/2022    Procedure: INFERIOR VENA CAVA FILTER REMOVAL;  Surgeon: Ernie Hew, MD;  Location: [place] CATH;  Service: Cardiology    PR UP GI ENDOSCOPY,BALL DIL,30MM N/A 10/28/2017    Procedure: UGI ENDO; W/BALLOON DILAT ESOPHAGUS (<30MM DIAM);  Surgeon: Liane Comber, MD;  [facility];  Service: Gastroenterology    PR UPPER GI ENDOSCOPY,BIOPSY Left 10/28/2017    Procedure: UGI ENDOSCOPY; WITH BIOPSY, SINGLE OR MULTIPLE;  Surgeon: Liane Comber, MD;  Location: [facility];  Service: Gastroenterology    ROTATOR CUFF REPAIR Right 07/2016           Family History   Problem Relation Age of Onset    Heart disease Mother     Hypertension Mother     Kidney disease Mother     Heart disease Father     Hypertension Father     Kidney disease Father            Social History     Socioeconomic History    Marital status: Widowed   Tobacco Use    Smoking status: Never    Smokeless tobacco: Never   Vaping Use    Vaping status: Never Used   Substance and Sexual Activity    Alcohol use: No     Alcohol/week: 0.0 standard drinks of alcohol    Drug  use: No    Sexual activity: Not Currently     Social Determinants of Health     Financial Resource Strain: Low Risk  (06/26/2020)    Received from [facility]    Overall Financial Resource Strain (CARDIA)     Difficulty of Paying Living Expenses: Not hard at all   Food Insecurity: No Food Insecurity (02/22/2020)    Received from [facility]    Hunger Vital Sign     Worried About Running Out of Food in the Last Year: Never true     Ran Out of Food in the Last Year: Never true   Transportation Needs: No Transportation Needs (02/22/2020)    Received from [facility] - Transportation     Lack of Transportation (Medical): No     Lack of Transportation (Non-Medical): No   Physical Activity: Insufficiently Active (06/26/2020)    Received from [facility]    Exercise Vital Sign     Days of Exercise per Week: 4 days     Minutes of Exercise per Session: 20 min   Stress: No Stress Concern Present (06/26/2020)    Received from [facility] - Occupational Stress Questionnaire     Feeling of Stress : Only a little   Social Connections: Socially Isolated (06/26/2020)    Received from [facility]    Social Connection and Isolation Panel [NHANES]     Frequency of Communication with Friends and Family: More than three times a week     Frequency of Social Gatherings with Friends and Family: Never     Attends Religious Services: Never     Database administrator or Organizations: No     Attends Banker Meetings: Never     Marital Status: Widowed         Current Allergy List  Allergies   Allergen Reactions    Trazodone Other (See Comments)     Paresthesias, weakness    Sulfa (Sulfonamide Antibiotics) Rash       Current Medication List  Current Outpatient Medications   Medication Sig Dispense Refill    ALPRAZolam (XANAX) 1 MG tablet Take 1 tablet (1 mg total) by mouth every morning. 1 tab in morning and 2 tabs at bedtime      amLODIPine (NORVASC) 5 MG tablet Take 0.5 tablets (2.5 mg total) by mouth every morning.      baclofen (LIORESAL) 10 MG tablet Take 1 tablet (10 mg total) by mouth nightly.      cetirizine (ZYRTEC) 10 MG tablet Take 1 tablet (10 mg total) by mouth every morning.      codeine-guaiFENesin (GUAIFENESIN AC) 10-100 mg/5 mL liquid Take 5 mL by mouth Three (3) times a day as needed for cough. 237 mL 3    fenofibrate micronized (LOFIBRA) 134 MG capsule Take 1 capsule (134 mg total) by mouth daily before breakfast.      fluticasone (FLONASE) 50 mcg/actuation nasal spray 1 spray into each nostril every morning.      lamoTRIgine (LAMICTAL) 100 MG tablet Take 1 tablet (100 mg total) by mouth every morning.      lisinopril (PRINIVIL,ZESTRIL) 20 MG tablet Take 1 tablet (20 mg total) by mouth every morning.      mycophenolate (CELLCEPT) 500 mg tablet Take 1 tablet (500 mg total) by mouth Two (2) times a day. 60 tablet 11    pantoprazole (PROTONIX) 40 MG tablet Take 1  tablet (40 mg total) by mouth two (2) times a day. 180 tablet 3    PARoxetine (PAXIL) 30 MG tablet Take 1 tablet (30 mg total) by mouth every morning.      pravastatin (PRAVACHOL) 40 MG tablet Take 1 tablet (40 mg total) by mouth every morning.      predniSONE (DELTASONE) 2.5 MG tablet Take 1 tablet (2.5 mg total) by mouth every morning. 90 tablet 3    promethazine (PHENERGAN) 25 MG tablet Take 1 tablet (25 mg total) by mouth every six (6) hours as needed for nausea.      temAZEpam (RESTORIL) 30 mg capsule Take 1 capsule (30 mg total) by mouth nightly as needed for sleep.       No current facility-administered medications for this visit.         REVIEW OF SYSTEMS:     All positives and pertinent negatives were mentioned in the history of present illness.    LABORATORY RESULTS:     Lab Results   Component Value Date    WBC 5.5 07/17/2021    HGB 11.6 (L) 07/17/2021    HCT 34.5 (L) 07/17/2021    PLT 215 07/17/2021       Lab Results Component Value Date    NA 138 07/20/2021    K 3.8 07/20/2021    CL 103 07/20/2021    CO2 26.0 07/20/2021    BUN 8 (L) 07/20/2021    CREATININE 0.72 07/20/2021    GLU 124 07/20/2021    CALCIUM 9.5 07/20/2021    MG 1.9 03/27/2021       Lab Results   Component Value Date    BILITOT 0.3 07/20/2021    BILIDIR 0.10 03/27/2021    PROT 6.7 07/20/2021    ALBUMIN 3.7 07/20/2021    ALT <7 (L) 07/20/2021    AST 13 07/20/2021    ALKPHOS 63 07/20/2021       Lab Results   Component Value Date    PT 12.4 02/29/2020    INR 1.04 02/29/2020    APTT 30.7 02/29/2020       PHYSICAL EXAM:     There were no vitals filed for this visit.  There is no height or weight on file to calculate BMI.      Constitutional - No acute distress, well appearing and well nourished.   Neck - Supple, symmetric. Trachea midline.  No masses. No JVD.  Cardiovascular:  S1 and S2 present. RRR. PMI not displaced. No thrills, lifts, or heaves.  No jugular vein distension. No carotid bruit. DP/PT/Radials 2+ and equal bilaterally. No edema  Respiratory: Normal respiratory effort with symmetrical lung expansion. Clear.  Musculoskeletal - Muscle strength and tone normal. No atrophy or abnormal movements noted.  Extremities - Extremities are warm bilaterally. No wounds. Palpable femoral pulses.  DP/PT/Radials 2+ and equal bilaterally.     SCRIBE ATTESTATION:     This document serves as a record of the services and decisions performed by Jeffie Pollock, MD on 12/18/2022. It was created on his behalf by Kandace Blitz, a trained medical scribe. The creation of this document is based on the provider's statements and observations that were conveyed to the medical scribe during the patient's encounter.    Jeffie Pollock, MD RPVI   Interventional Cardiology  [facility]

## 2022-12-20 MED FILL — MYCOPHENOLATE MOFETIL 500 MG TABLET: ORAL | 30 days supply | Qty: 60 | Fill #9

## 2023-01-16 NOTE — Unmapped (Signed)
[facility] Refill Coordination Note    Specialty Medication(s) to be Shipped:   CF/Pulmonary/Asthma: mycophenolate 500 mg tablet (CELLCEPT)    Other medication(s) to be shipped: No additional medications requested for fill at this time     Amber Bush, DOB: December 19, 1955  Phone: [phone] (home)       All above HIPAA information was verified with patient.     Was a Nurse, learning disability used for this call? No    Completed refill call assessment today to schedule patient's medication shipment from the [facility] [phone]).  All relevant notes have been reviewed.     Specialty medication(s) and dose(s) confirmed: Regimen is correct and unchanged.   Changes to medications: Amber Bush reports no changes at this time.  Changes to insurance: No  New side effects reported not previously addressed with a pharmacist or physician: None reported  Questions for the pharmacist: No    Confirmed patient received a Conservation officer, historic buildings and a Surveyor, mining with first shipment. The patient will receive a drug information handout for each medication shipped and additional FDA Medication Guides as required.       DISEASE/MEDICATION-SPECIFIC INFORMATION        N/A    SPECIALTY MEDICATION ADHERENCE     Medication Adherence    Patient reported X missed doses in the last month: 0  Specialty Medication: mycophenolate 500 mg tablet (CELLCEPT)  Patient is on additional specialty medications: No  Patient is on more than two specialty medications: No  Any gaps in refill history greater than 2 weeks in the last 3 months: no  Demonstrates understanding of importance of adherence: yes  Informant: patient  Reliability of informant: reliable  Provider-estimated medication adherence level: good  Patient is at risk for Non-Adherence: No  Reasons for non-adherence: no problems identified              Were doses missed due to medication being on hold? No    mycophenolate 500 mg tablet (CELLCEPT)  : 7 days of medicine on hand        REFERRAL TO PHARMACIST     Referral to the pharmacist: Not needed      [facility]     Shipping address confirmed in Epic.       Delivery Scheduled: Yes, Expected medication delivery date: 01/23/23.     Medication will be delivered via Same Day Courier to the prescription address in Epic WAM.    Amber Duve' W Danae Chen Shared [facility] Specialty Technician

## 2023-01-23 MED FILL — MYCOPHENOLATE MOFETIL 500 MG TABLET: ORAL | 30 days supply | Qty: 60 | Fill #10

## 2023-02-03 ENCOUNTER — Ambulatory Visit: Admit: 2023-02-03 | Discharge: 2023-02-03 | Payer: MEDICARE | Attending: Internal Medicine | Primary: Internal Medicine

## 2023-02-03 ENCOUNTER — Ambulatory Visit: Admit: 2023-02-03 | Discharge: 2023-02-03 | Payer: MEDICARE

## 2023-02-03 DIAGNOSIS — R799 Abnormal finding of blood chemistry, unspecified: Principal | ICD-10-CM

## 2023-02-03 DIAGNOSIS — J849 Interstitial pulmonary disease, unspecified: Principal | ICD-10-CM

## 2023-02-03 DIAGNOSIS — R0602 Shortness of breath: Principal | ICD-10-CM

## 2023-02-03 DIAGNOSIS — R300 Dysuria: Principal | ICD-10-CM

## 2023-02-03 LAB — URINALYSIS WITH MICROSCOPY WITH CULTURE REFLEX PERFORMABLE
BLOOD UA: NEGATIVE
GLUCOSE UA: 100 — AB
KETONES UA: NEGATIVE
LEUKOCYTE ESTERASE UA: NEGATIVE
NITRITE UA: NEGATIVE
PH UA: 6 (ref 5.0–9.0)
PROTEIN UA: NEGATIVE
RBC UA: 0 /HPF (ref 0–3)
SPECIFIC GRAVITY UA: >=1.03 (ref 1.005–1.030)
SQUAMOUS EPITHELIAL: 40 /HPF — ABNORMAL HIGH (ref 0–5)
UROBILINOGEN UA: 0.2
WBC UA: 2 /HPF (ref 0–3)

## 2023-02-03 LAB — BASIC METABOLIC PANEL
ANION GAP: 15 mmol/L — ABNORMAL HIGH (ref 5–14)
BLOOD UREA NITROGEN: 11 mg/dL (ref 9–23)
BUN / CREAT RATIO: 14
CALCIUM: 9.8 mg/dL (ref 8.7–10.4)
CHLORIDE: 101 mmol/L (ref 98–107)
CO2: 18.2 mmol/L — ABNORMAL LOW (ref 20.0–31.0)
CREATININE: 0.78 mg/dL
EGFR CKD-EPI (2021) FEMALE: 82 mL/min/{1.73_m2} (ref >=60)
GLUCOSE RANDOM: 139 mg/dL — ABNORMAL HIGH (ref 70–99)
POTASSIUM: 3.6 mmol/L (ref 3.4–4.8)
SODIUM: 134 mmol/L — ABNORMAL LOW (ref 135–145)

## 2023-02-03 LAB — CBC W/ AUTO DIFF
BASOPHILS ABSOLUTE COUNT: 0.1 10*9/L (ref 0.0–0.1)
BASOPHILS RELATIVE PERCENT: 0.6 %
EOSINOPHILS ABSOLUTE COUNT: 0.2 10*9/L (ref 0.0–0.5)
EOSINOPHILS RELATIVE PERCENT: 1.1 %
HEMATOCRIT: 39.9 % (ref 34.0–44.0)
HEMOGLOBIN: 13.6 g/dL (ref 11.3–14.9)
LYMPHOCYTES ABSOLUTE COUNT: 3.6 10*9/L (ref 1.1–3.6)
LYMPHOCYTES RELATIVE PERCENT: 24.2 %
MEAN CORPUSCULAR HEMOGLOBIN CONC: 34 g/dL (ref 32.0–36.0)
MEAN CORPUSCULAR HEMOGLOBIN: 27.9 pg (ref 25.9–32.4)
MEAN CORPUSCULAR VOLUME: 81.9 fL (ref 77.6–95.7)
MEAN PLATELET VOLUME: 7.4 fL (ref 6.8–10.7)
MONOCYTES ABSOLUTE COUNT: 0.9 10*9/L — ABNORMAL HIGH (ref 0.3–0.8)
MONOCYTES RELATIVE PERCENT: 6.1 %
NEUTROPHILS ABSOLUTE COUNT: 10 10*9/L — ABNORMAL HIGH (ref 1.8–7.8)
NEUTROPHILS RELATIVE PERCENT: 68 %
PLATELET COUNT: 342 10*9/L (ref 150–450)
RED BLOOD CELL COUNT: 4.87 10*12/L (ref 3.95–5.13)
RED CELL DISTRIBUTION WIDTH: 13.3 % (ref 12.2–15.2)
WBC ADJUSTED: 14.7 10*9/L — ABNORMAL HIGH (ref 3.6–11.2)

## 2023-02-03 LAB — HEPATIC FUNCTION PANEL
ALBUMIN: 4.3 g/dL (ref 3.4–5.0)
ALKALINE PHOSPHATASE: 80 U/L (ref 46–116)
ALT (SGPT): 17 U/L (ref 10–49)
AST (SGOT): 17 U/L (ref <=34)
BILIRUBIN DIRECT: 0.2 mg/dL (ref 0.00–0.30)
BILIRUBIN TOTAL: 0.4 mg/dL (ref 0.3–1.2)
PROTEIN TOTAL: 7.4 g/dL (ref 5.7–8.2)

## 2023-02-03 LAB — C-REACTIVE PROTEIN: C-REACTIVE PROTEIN: 11 mg/L — ABNORMAL HIGH (ref <=10.0)

## 2023-02-03 LAB — IRON & TIBC
IRON SATURATION: 16 % — ABNORMAL LOW (ref 20–55)
IRON: 57 ug/dL
TOTAL IRON BINDING CAPACITY: 351 ug/dL (ref 250–425)

## 2023-02-03 LAB — FERRITIN: FERRITIN: 21.8 ng/mL

## 2023-02-03 LAB — B-TYPE NATRIURETIC PEPTIDE: B-TYPE NATRIURETIC PEPTIDE: 11.43 pg/mL (ref <=100)

## 2023-02-03 LAB — SEDIMENTATION RATE: ERYTHROCYTE SEDIMENTATION RATE: 15 mm/h (ref 0–30)

## 2023-02-03 LAB — CK: CREATINE KINASE TOTAL: 34 U/L

## 2023-02-03 LAB — IGG: GAMMAGLOBULIN; IGG: 680 mg/dL (ref 650–1600)

## 2023-02-03 NOTE — Unmapped (Signed)
Refering Physician: Dr Kalman Shan, [facility]    CHief Complaint: ILD complicaitons    HPI   [phone] women who presents with worsening SOB and fatigue  - she gets very SOB with activity deespite O2 levels of 99% on RA  - she notes worsening depression and anxiety  - progressing over past 12 months    Of Note  - she is grieving the loss of her husband who died in 30-Jun-2018and her son who died 12/14/18   Today  - feels OK, biggest issue is grief    ROS   - the balance of 10 systems is negative other than noted above     PMH   - ILD  - PE, 09/2016    --> bilateral  - OSA    --> CPAP  - CAD  - Fibromyalgia  - Depression    FH   - son with CF    SH   - non-smoker  - no birds/pets  - new house    MEDS (personally reviewed in EPIC, pertinent meds noted below)     PHYSICAL EXAM   Vitals - BP 149/83 (BP Site: L Arm, BP Position: Sitting, BP Cuff Size: Large)  - Pulse 98  - Wt (!) 111.1 kg (245 lb)  - SpO2 97%  - BMI 42.24 kg/m??   Gen - awake, alert, in NAD   Derm - no rash   HEENT - no adenopathy, TMs clear   Vascular - good pulses throughout, no JVP   CV - RRR,no murmers or gallops   Pulm - faint crackles at the bases  Abd - soft, NT, ND, +BS, no hepatosplenomegally   Ext - no edema, no clubbing   Joints - no enlarged joints, no warmth, redness or effusions   Neuro - CN grossly intact, nl gait     RESULTS     Labs (reviewd in EPIC, pertinent values noted below)       PFTs (personally reveiewed and interpreted)     Date: FVC (% Pred) FEV1 (% Pred) FEF25-75(% Pred) DLCO TBBx/Results                                                                            02/03/23  1.85  1.45        03/27/21  1.73  1.44   71%     03/23/19  1.92  1.61   66%     03/09/18  1.86  1.52   63%     10/27/17  1.70  1.41   76%     08/11/17  1.91  1.57    63%     03/24/17  2.03  1.62  1.97  67%     12/06/16  1.86 (57%)  1.52 (60%)  1.86 (83%)  70%        6 Minute Walk   - walked 224m  - lowest sat was 97% on RA    HRCT Chest (03/2016, OSH, personally reviewed and interpretted)   - faint interstitial markings  - gas trapping  - resolved GG nodule (seen 2 weeks prior on CT abdomen)    HRCT Chest (09/2016, personally reviewed and  interpretted)   - worsening bibasilar streaky densitites  - gas trapping    CTA Chest (09/2016, personally reviewed and interpretted)   - bilateral sub-massive PE with signs of RH strain    A/P   67yo with ILD and PE    ILD  - PFTs with moderate restriction and mildly reduced DLCO... PFTs stable  - walk with mild but not clinically significant hypoxia, similar to prior but walked slower  - will continue cellcept 500mg  PO BID  - she is off prednisone  - MCTD w/u negative but full panel not sent  - pH/Mano with no reflux but esophogeal issues... Seeing GI  - Needs to work on weight loss and exercise... This will help the most    Sinusitis/PND  - advised neti pott twice daily  - no azithro as did not help    PE  - bilatearl and secondary to rotator cuff surgery and estrogen supplmetns  - stopped xeralto  - ddimer and CTA as above    PCP Proph:  - none needed    Bone Health  - Last Dexa:  Through her PCP    Vaccines  - Influenza:  - TDAP: 11/2015  - Prevnar 13: she says UTD  - PNA 23: she says UTD  - Shinrix: needs through local pharmacy      Complex medical decision making was done as we adjust medications based on blood work/drug levels and multiple complaints and problems were addresed

## 2023-02-03 NOTE — Unmapped (Signed)
-   follow-up as instructed    - for questions please reach out to my nurse Liesl:    --> Phone: [phone]    --> Fax: [phone]

## 2023-02-05 DIAGNOSIS — R0602 Shortness of breath: Principal | ICD-10-CM

## 2023-02-05 DIAGNOSIS — J849 Interstitial pulmonary disease, unspecified: Principal | ICD-10-CM

## 2023-02-05 NOTE — Unmapped (Signed)
Order for ONO on RA sent to [facility].

## 2023-02-06 NOTE — Unmapped (Signed)
Called patient regarding her labs, specifically her hyponatremia and her metabolic acidosis.  - she has glucose in urine    Advised her to go to the ER if feels worse vs call her PCP and make an appointment for tomorrow.

## 2023-02-28 NOTE — Unmapped (Addendum)
Amber Bush has been contacted in regards to their refill of mycophenolate 500 mg tablet (CELLCEPT). At this time, they have declined refill due to  off of medication per provider until follow up appointment in 3 months. Breathing assessment will be conducted then to determine if she should resume mycophenolate therapy.Follow up in 3 months . Refill assessment call date has been updated per the patient's request.

## 2023-05-01 MED ORDER — CODEINE 10 MG-GUAIFENESIN 100 MG/5 ML ORAL LIQUID
Freq: Four times a day (QID) | ORAL | 0 refills | 12 days | Status: CP | PRN
Start: 2023-05-01 — End: 2023-05-15

## 2023-05-13 NOTE — Unmapped (Unsigned)
[facility] PATIENT VISIT    Date of Encounter: 05/12/2023  Patient Name: Amber Bush 09/11/1956 67 y.o.    Primary Care Provider:  Royce Macadamia, MD  Primary Cardiologist: Dr. Carole Binning    Reason for visit: Amber Bush  is a 67 y.o. female seen today in clinic for possibly being in afib, elevated HR    History of Present Illness  Amber Bush is a 67 y.o. female with pmhx significant for  DVT, PE, ILD, SOB, CP, and morbid obesity who called with elevated heart rate at home and possibly in afib per ***.          The patient denies active chest pain, shortness of breath/DOE, orthopnea/PND, pedal edema, palpitations, dizziness, or syncope/near syncope. Patient also denies pain with walking, discoloration, ulceration, or rest pain of the lower extremities.  ***    Accessory Clinical Findings   I independently reviewed last cardiology note and/or *** ER visit note.    Past Medical History   Past Medical History:   Diagnosis Date    Anxiety     Colon polyp     Depression     Diverticulitis of colon     DVT (deep vein thrombosis) in pregnancy     Fibrocystic breast     GERD (gastroesophageal reflux disease)     Hypertension     ILD (interstitial lung disease) (CMS-HCC)     Interstitial lung disease (CMS-HCC)     OSA (obstructive sleep apnea)     PE (pulmonary thromboembolism) (CMS-HCC)     Pulmonary embolism (CMS-HCC)     Sciatica     Sleep apnea      Past Surgical History:   Procedure Laterality Date    CHOLECYSTECTOMY      HYSTERECTOMY      partial    IC IVC FILTER PLACEMENT ([facility] HISTORICAL RESULT)      ORIF ANKLE FRACTURE Left     PR CATH PLACE/CORON ANGIO, IMG SUPER/INTERP,R&L HRT CATH, L HRT VENTRIC N/A 04/02/2021    Procedure: Left/Right Heart Catheterization;  Surgeon: Neal Dy, MD;  Location: [facility] CATH;  Service: Cardiology    PR ESOPHAGEAL MOTILITY STUDY, MANOMETRY N/A 01/16/2017    Procedure: ESOPHAGEAL MOTILITY STUDY W/INT & REP;  Surgeon: Nurse-Based Giproc;  [facility]: [facility];  Service: Gastroenterology    PR GERD TST W/ NASAL IMPEDENCE ELECTROD N/A 01/16/2017    Procedure: ESOPH FUNCT TST NASL ELEC PLCMT;  Surgeon: Nurse-Based Giproc;  [facility]: [facility];  Service: Gastroenterology    PR GERD TST W/ NASAL PH ELECTROD N/A 01/16/2017    Procedure: ESOPHAGEAL 24 HOUR PH MONITORING;  Surgeon: Nurse-Based Giproc;  [facility]: [facility];  Service: Gastroenterology    PR INS INTRVAS VC FILTR W/WO VAS ACS VSL SELXN RS&I Right 07/17/2021    Procedure: INFERIOR VENA CAVA FILTER PLACEMENT;  Surgeon: Ernie Hew, MD;  Location: [facility] CATH;  Service: Cardiology    PR REMOVAL IMPLANT DEEP Left 07/20/2021    Procedure: REMOVAL OF LEFT ANKLE HARDWARE;  Surgeon: Arva Chafe, MD;  Location: OR [place];  Service: Orthopedics    PR RTRVL INTRVAS VC FILTR W/WO ACS VSL SELXN RS&I Right 02/26/2022    Procedure: INFERIOR VENA CAVA FILTER REMOVAL;  Surgeon: Ernie Hew, MD;  Location: [facility] CATH;  Service: Cardiology    PR UP GI ENDOSCOPY,BALL DIL,30MM N/A 10/28/2017    Procedure: UGI ENDO; W/BALLOON DILAT ESOPHAGUS (<30MM DIAM);  Surgeon: Liane Comber, MD;  Location: [facility];  Service: Gastroenterology    PR UPPER GI ENDOSCOPY,BIOPSY Left 10/28/2017    Procedure: UGI ENDOSCOPY; WITH BIOPSY, SINGLE OR MULTIPLE;  Surgeon: Liane Comber, MD;  Location: [facility];  Service: Gastroenterology    ROTATOR CUFF REPAIR Right 07/2016       Current Allergy List  Allergies   Allergen Reactions    Trazodone Other (See Comments)     Paresthesias, weakness    Sulfa (Sulfonamide Antibiotics) Rash       Current Medication List  Current Outpatient Medications   Medication Sig Dispense Refill    ALPRAZolam (XANAX) 1 MG tablet Take 1 tablet (1 mg total) by mouth every morning. 1 tab at bedtime      amLODIPine (NORVASC) 5 MG tablet Take 0.5 tablets (2.5 mg total) by mouth every morning. 5 mg once in the AM      cetirizine (ZYRTEC) 10 MG tablet Take 1 tablet (10 mg total) by mouth every morning.      clobetasol (TEMOVATE) 0.05 % cream Apply topically two (2) times a day.      codeine-guaiFENesin (GUAIFENESIN AC) 10-100 mg/5 mL liquid Take 5 mL by mouth four (4) times a day as needed for cough for up to 14 days. 237 mL 0    escitalopram oxalate (LEXAPRO) 10 MG tablet 2 tablets (20 mg total).      fenofibrate micronized (LOFIBRA) 134 MG capsule Take 1 capsule (134 mg total) by mouth daily before breakfast.      hydrocortisone 2.5 % cream Apply topically two (2) times a day.      lisinopril (PRINIVIL,ZESTRIL) 20 MG tablet Take 1 tablet (20 mg total) by mouth every morning.      mycophenolate (CELLCEPT) 500 mg tablet Take 1 tablet (500 mg total) by mouth Two (2) times a day. 60 tablet 11    pantoprazole (PROTONIX) 40 MG tablet Take 1 tablet (40 mg total) by mouth two (2) times a day. 180 tablet 3    promethazine (PHENERGAN) 25 MG tablet Take 1 tablet (25 mg total) by mouth every six (6) hours as needed for nausea.      temazepam (RESTORIL ORAL) Take by mouth. 15 mg      triamcinolone (KENALOG) 0.1 % cream Apply topically two (2) times a day.       No current facility-administered medications for this visit.       Family History    Family History   Problem Relation Age of Onset    Heart disease Mother     Hypertension Mother     Kidney disease Mother     Heart disease Father     Hypertension Father     Kidney disease Father        Social History    Social History     Socioeconomic History    Marital status: Widowed   Tobacco Use    Smoking status: Never    Smokeless tobacco: Never   Vaping Use    Vaping status: Never Used   Substance and Sexual Activity    Alcohol use: No     Alcohol/week: 0.0 standard drinks of alcohol    Drug use: No    Sexual activity: Not Currently     Social Determinants of Health     Financial Resource Strain: Low Risk  (06/26/2020)    Received from [facility]    Overall Financial Resource Strain (CARDIA)  Difficulty of Paying Living Expenses: Not hard at all   Food Insecurity: No Food Insecurity (02/22/2020)    Received from [facility]    Hunger Vital Sign     Worried About Running Out of Food in the Last Year: Never true     Ran Out of Food in the Last Year: Never true   Transportation Needs: No Transportation Needs (02/22/2020)    Received from [facility] - Transportation     Lack of Transportation (Medical): No     Lack of Transportation (Non-Medical): No   Physical Activity: Insufficiently Active (06/26/2020)    Received from [facility]    Exercise Vital Sign     Days of Exercise per Week: 4 days     Minutes of Exercise per Session: 20 min   Stress: No Stress Concern Present (06/26/2020)    Received from [facility] - Occupational Stress Questionnaire     Feeling of Stress : Only a little   Social Connections: Socially Isolated (06/26/2020)    Received from [facility]    Social Connection and Isolation Panel [NHANES]     Frequency of Communication with Friends and Family: More than three times a week     Frequency of Social Gatherings with Friends and Family: Never     Attends Religious Services: Never     Database administrator or Organizations: No     Attends Banker Meetings: Never     Marital Status: Widowed       Review of Systems  Except as noted in the history of present of illness, all other systems are negative.    Physical Exam  There were no vitals taken for this visit.   Constitutional: Well developed, well nourished, in no acute distress  Neurologic/ Psychiatric: Alert and oriented X 3. Mood and affect appropriate.  HEENT: Normocephalic, atraumatic, carotids without bruit or thrill, JVD absent  Respiratory: CTAB, diminished bases, no wheeze/rhonchi/crackles  Cardiovascular: RRR, S1S2 no mrg  ***  Extremities and distal pulses: No peripheral edema.  2+ radial/DP and symmetrical upper extremities  GI: s/nt/nd, BS (+), no obvious masses  Musculoskeletal: Muscle strength and tone normal. No atrophy or abnormal movements noted.  Skin: Skin is warm, and without edema, discoloration, or lesions   Hematologic/Lymphatic/Immunologic: No signs of bleeding or excessive bruising.      ASSESSMENT/PLAN   ***  There are no diagnoses linked to this encounter.      Risk Score:  Revised Cardiac Risk Index for non-cardiac surgery: ***    CHA2Ds2VaSc score is: ***    The 10-year ASCVD risk score (Arnett DK, et al., 2019) is: 16%     Recent Cardiac Diagnostics  EKG 12/18/2022: NSR      PVL BLE duplex 01/28/2022: There was no evidence of acute deep vein thrombosis on this bilateral  lower extremity venous duplex exam.        CTA chest 02/28/2022: No acute pulmonary embolisms are identified.    Cath 02/26/2022: IVC filter removal.      Cath 07/17/2021: IVC filter placement.      Cath 04/02/2021: No significant CAD    Disposition: Return to clinic in ***.    Effie Berkshire Sharine Cadle, AGNP-C  05/12/2023, 6:53 PM

## 2023-05-14 MED ORDER — PREDNISONE 2.5 MG TABLET
ORAL_TABLET | Freq: Every day | ORAL | 3 refills | 90 days | Status: CP
Start: 2023-05-14 — End: 2024-05-13

## 2023-06-12 NOTE — Unmapped (Signed)
Specialty Medication(s): mycophenolate    Ms.Fawcett has been dis-enrolled from the [facility] specialty pharmacy services due to  pt declining refill since breathing function has improved. Dr. Dudley Major was made aware of this decision in June 2024 .    Additional information provided to the patient: n/a - Pt has follow-up appt in Dec 2024.     Oliva Bustard, PharmD  [facility] Specialty Pharmacist

## 2023-07-08 DIAGNOSIS — J849 Interstitial pulmonary disease, unspecified: Principal | ICD-10-CM

## 2023-07-08 DIAGNOSIS — R059 Cough, unspecified type: Principal | ICD-10-CM

## 2023-07-08 MED ORDER — CODEINE 10 MG-GUAIFENESIN 100 MG/5 ML ORAL LIQUID
Freq: Four times a day (QID) | ORAL | 0 refills | 24 days | Status: CP | PRN
Start: 2023-07-08 — End: 2023-07-08

## 2023-07-29 DIAGNOSIS — G4700 Insomnia, unspecified: Principal | ICD-10-CM

## 2023-07-30 ENCOUNTER — Encounter: Payer: Self-pay | Admitting: Psychiatry

## 2023-12-26 DIAGNOSIS — J849 Interstitial pulmonary disease, unspecified: Principal | ICD-10-CM

## 2023-12-26 DIAGNOSIS — J398 Other specified diseases of upper respiratory tract: Principal | ICD-10-CM

## 2024-01-12 ENCOUNTER — Ambulatory Visit: Admit: 2024-01-12 | Discharge: 2024-01-13 | Payer: Medicare (Managed Care)

## 2024-01-12 ENCOUNTER — Inpatient Hospital Stay: Admit: 2024-01-12 | Discharge: 2024-01-13 | Payer: Medicare (Managed Care)

## 2024-01-13 MED ORDER — CIPROFLOXACIN 500 MG TABLET
ORAL_TABLET | Freq: Two times a day (BID) | ORAL | 0 refills | 7.00000 days | Status: CP
Start: 2024-01-13 — End: 2024-01-20

## 2024-01-13 MED ORDER — ALPRAZOLAM 1 MG TABLET
ORAL_TABLET | Freq: Four times a day (QID) | ORAL | 0 refills | 3.00000 days | Status: CP | PRN
Start: 2024-01-13 — End: 2024-01-18

## 2024-02-11 MED ORDER — PREDNISONE 2.5 MG TABLET
ORAL_TABLET | Freq: Every day | ORAL | 3 refills | 90.00000 days | Status: CP
Start: 2024-02-11 — End: 2025-02-10

## 2024-03-10 ENCOUNTER — Encounter: Admit: 2024-03-10 | Discharge: 2024-03-11 | Payer: Medicare (Managed Care)

## 2024-03-10 ENCOUNTER — Ambulatory Visit: Admit: 2024-03-10 | Discharge: 2024-03-11 | Payer: Medicare (Managed Care)

## 2024-03-10 DIAGNOSIS — I959 Hypotension, unspecified: Principal | ICD-10-CM

## 2024-03-10 DIAGNOSIS — I2510 Atherosclerotic heart disease of native coronary artery without angina pectoris: Principal | ICD-10-CM

## 2024-03-10 DIAGNOSIS — N3000 Acute cystitis without hematuria: Principal | ICD-10-CM

## 2024-03-10 DIAGNOSIS — R350 Frequency of micturition: Principal | ICD-10-CM

## 2024-03-10 DIAGNOSIS — R3915 Urgency of urination: Principal | ICD-10-CM

## 2024-03-10 MED ORDER — EVOLOCUMAB 140 MG/ML SUBCUTANEOUS PEN INJECTOR
SUBCUTANEOUS | 6 refills | 0.00000 days | Status: CP
Start: 2024-03-10 — End: ?

## 2024-03-10 MED ORDER — CIPROFLOXACIN 250 MG TABLET
ORAL_TABLET | Freq: Two times a day (BID) | ORAL | 0 refills | 5.00000 days | Status: CP
Start: 2024-03-10 — End: 2024-03-15

## 2024-03-15 DIAGNOSIS — I2510 Atherosclerotic heart disease of native coronary artery without angina pectoris: Principal | ICD-10-CM

## 2024-03-18 DIAGNOSIS — I2510 Atherosclerotic heart disease of native coronary artery without angina pectoris: Principal | ICD-10-CM

## 2024-03-25 DIAGNOSIS — R0602 Shortness of breath: Principal | ICD-10-CM

## 2024-05-05 ENCOUNTER — Ambulatory Visit
Admit: 2024-05-05 | Discharge: 2024-05-06 | Payer: Medicare (Managed Care) | Attending: Pharmacotherapy | Primary: Pharmacotherapy

## 2024-05-05 ENCOUNTER — Ambulatory Visit: Admit: 2024-05-05 | Discharge: 2024-05-06 | Payer: Medicare (Managed Care)

## 2024-05-05 DIAGNOSIS — R002 Palpitations: Principal | ICD-10-CM

## 2024-05-05 DIAGNOSIS — I10 Essential (primary) hypertension: Principal | ICD-10-CM

## 2024-05-05 DIAGNOSIS — I2510 Atherosclerotic heart disease of native coronary artery without angina pectoris: Principal | ICD-10-CM

## 2024-05-05 DIAGNOSIS — R079 Chest pain, unspecified: Principal | ICD-10-CM

## 2024-05-05 DIAGNOSIS — E785 Hyperlipidemia, unspecified: Principal | ICD-10-CM

## 2024-05-05 MED ORDER — EVOLOCUMAB 140 MG/ML SUBCUTANEOUS PEN INJECTOR
SUBCUTANEOUS | 3 refills | 0.00000 days | Status: CP
Start: 2024-05-05 — End: ?

## 2024-06-08 DIAGNOSIS — F32A Depression, unspecified depression type: Principal | ICD-10-CM

## 2024-07-20 ENCOUNTER — Emergency Department
Admit: 2024-07-20 | Discharge: 2024-07-20 | Disposition: A | Discharge status: Home / Self Care | Payer: Medicare (Managed Care)

## 2024-07-20 DIAGNOSIS — R109 Unspecified abdominal pain: Principal | ICD-10-CM

## 2024-08-04 ENCOUNTER — Inpatient Hospital Stay: Admit: 2024-08-04 | Discharge: 2024-08-04 | Payer: Medicare (Managed Care)

## 2024-08-04 ENCOUNTER — Ambulatory Visit
Admit: 2024-08-04 | Discharge: 2024-08-04 | Payer: Medicare (Managed Care) | Attending: Internal Medicine | Primary: Internal Medicine

## 2024-08-04 DIAGNOSIS — J849 Interstitial pulmonary disease, unspecified: Principal | ICD-10-CM

## 2024-08-04 DIAGNOSIS — R0602 Shortness of breath: Principal | ICD-10-CM

## 2024-08-04 DIAGNOSIS — N3001 Acute cystitis with hematuria: Principal | ICD-10-CM

## 2024-08-04 MED ORDER — SULFAMETHOXAZOLE 800 MG-TRIMETHOPRIM 160 MG TABLET
ORAL_TABLET | Freq: Every day | ORAL | 3 refills | 90.00000 days | Status: CP
Start: 2024-08-04 — End: 2025-08-04
